# Patient Record
Sex: Male | Born: 1975 | Race: White | Hispanic: No | Marital: Single | Smoking: Current every day smoker
Health system: Southern US, Community
[De-identification: ages and names within clinical notes are randomized; demographics above are authoritative.]

## PROBLEM LIST (undated history)

## (undated) DIAGNOSIS — I5032 Chronic diastolic (congestive) heart failure: Secondary | ICD-10-CM

## (undated) DIAGNOSIS — I10 Essential (primary) hypertension: Secondary | ICD-10-CM

## (undated) DIAGNOSIS — F32A Depression, unspecified: Secondary | ICD-10-CM

## (undated) DIAGNOSIS — G8929 Other chronic pain: Secondary | ICD-10-CM

## (undated) DIAGNOSIS — Z720 Tobacco use: Secondary | ICD-10-CM

## (undated) DIAGNOSIS — F419 Anxiety disorder, unspecified: Secondary | ICD-10-CM

## (undated) DIAGNOSIS — L03119 Cellulitis of unspecified part of limb: Secondary | ICD-10-CM

## (undated) DIAGNOSIS — F259 Schizoaffective disorder, unspecified: Secondary | ICD-10-CM

## (undated) DIAGNOSIS — F329 Major depressive disorder, single episode, unspecified: Secondary | ICD-10-CM

## (undated) DIAGNOSIS — M79606 Pain in leg, unspecified: Secondary | ICD-10-CM

## (undated) HISTORY — DX: Pain in leg, unspecified: M79.606

## (undated) HISTORY — PX: OTHER SURGICAL HISTORY: SHX169

## (undated) HISTORY — DX: Essential (primary) hypertension: I10

## (undated) HISTORY — DX: Schizoaffective disorder, unspecified: F25.9

## (undated) HISTORY — DX: Other chronic pain: G89.29

## (undated) HISTORY — DX: Major depressive disorder, single episode, unspecified: F32.9

## (undated) HISTORY — DX: Depression, unspecified: F32.A

---

## 2002-01-21 HISTORY — PX: OTHER SURGICAL HISTORY: SHX169

## 2004-01-22 HISTORY — PX: OTHER SURGICAL HISTORY: SHX169

## 2006-03-27 HISTORY — PX: OTHER SURGICAL HISTORY: SHX169

## 2006-04-23 ENCOUNTER — Ambulatory Visit: Payer: Self-pay | Admitting: Family Medicine

## 2006-06-02 ENCOUNTER — Encounter: Payer: Self-pay | Admitting: Family Medicine

## 2006-06-02 ENCOUNTER — Ambulatory Visit: Payer: Self-pay | Admitting: Pain Medicine

## 2006-06-02 DIAGNOSIS — F17200 Nicotine dependence, unspecified, uncomplicated: Secondary | ICD-10-CM | POA: Insufficient documentation

## 2006-06-02 DIAGNOSIS — G8929 Other chronic pain: Secondary | ICD-10-CM | POA: Insufficient documentation

## 2006-06-02 DIAGNOSIS — M549 Dorsalgia, unspecified: Secondary | ICD-10-CM

## 2006-06-02 DIAGNOSIS — F411 Generalized anxiety disorder: Secondary | ICD-10-CM | POA: Insufficient documentation

## 2006-06-02 DIAGNOSIS — F329 Major depressive disorder, single episode, unspecified: Secondary | ICD-10-CM

## 2006-06-10 ENCOUNTER — Ambulatory Visit: Payer: Self-pay | Admitting: Pain Medicine

## 2006-06-25 ENCOUNTER — Ambulatory Visit: Payer: Self-pay | Admitting: Pain Medicine

## 2006-06-25 ENCOUNTER — Encounter: Payer: Self-pay | Admitting: Family Medicine

## 2006-07-28 ENCOUNTER — Ambulatory Visit: Payer: Self-pay | Admitting: Pain Medicine

## 2006-07-28 ENCOUNTER — Encounter: Payer: Self-pay | Admitting: Family Medicine

## 2006-08-28 ENCOUNTER — Ambulatory Visit: Payer: Self-pay | Admitting: Pain Medicine

## 2006-08-28 ENCOUNTER — Encounter: Payer: Self-pay | Admitting: Family Medicine

## 2006-09-17 ENCOUNTER — Ambulatory Visit: Payer: Self-pay | Admitting: Pain Medicine

## 2006-10-20 ENCOUNTER — Ambulatory Visit: Payer: Self-pay | Admitting: Pain Medicine

## 2006-11-25 ENCOUNTER — Ambulatory Visit: Payer: Self-pay | Admitting: Physician Assistant

## 2006-12-24 ENCOUNTER — Ambulatory Visit: Payer: Self-pay | Admitting: Physician Assistant

## 2006-12-31 ENCOUNTER — Telehealth: Payer: Self-pay | Admitting: Family Medicine

## 2007-01-06 ENCOUNTER — Encounter (INDEPENDENT_AMBULATORY_CARE_PROVIDER_SITE_OTHER): Payer: Self-pay | Admitting: *Deleted

## 2007-02-23 ENCOUNTER — Ambulatory Visit: Payer: Self-pay | Admitting: Physician Assistant

## 2007-05-26 ENCOUNTER — Ambulatory Visit: Payer: Self-pay | Admitting: Physician Assistant

## 2007-07-31 ENCOUNTER — Ambulatory Visit: Payer: Self-pay | Admitting: Family Medicine

## 2007-08-14 ENCOUNTER — Ambulatory Visit: Payer: Self-pay | Admitting: Family Medicine

## 2007-08-18 ENCOUNTER — Ambulatory Visit: Payer: Self-pay | Admitting: Professional

## 2007-08-18 ENCOUNTER — Telehealth: Payer: Self-pay | Admitting: Family Medicine

## 2007-08-26 ENCOUNTER — Ambulatory Visit: Payer: Self-pay | Admitting: Physician Assistant

## 2007-08-26 ENCOUNTER — Ambulatory Visit: Payer: Self-pay | Admitting: Family Medicine

## 2007-09-29 ENCOUNTER — Ambulatory Visit: Payer: Self-pay | Admitting: Family Medicine

## 2007-11-19 ENCOUNTER — Ambulatory Visit: Payer: Self-pay | Admitting: Physician Assistant

## 2008-02-18 ENCOUNTER — Ambulatory Visit: Payer: Self-pay | Admitting: Physician Assistant

## 2008-05-17 ENCOUNTER — Ambulatory Visit: Payer: Self-pay | Admitting: Physician Assistant

## 2008-08-25 ENCOUNTER — Ambulatory Visit: Payer: Self-pay | Admitting: Physician Assistant

## 2008-11-24 ENCOUNTER — Ambulatory Visit: Payer: Self-pay | Admitting: Physician Assistant

## 2009-01-30 ENCOUNTER — Ambulatory Visit: Payer: Self-pay | Admitting: Family Medicine

## 2009-03-22 ENCOUNTER — Ambulatory Visit: Payer: Self-pay | Admitting: Family Medicine

## 2009-03-27 ENCOUNTER — Encounter: Payer: Self-pay | Admitting: Family Medicine

## 2009-03-27 ENCOUNTER — Ambulatory Visit: Payer: Self-pay | Admitting: Pain Medicine

## 2009-06-14 ENCOUNTER — Ambulatory Visit: Payer: Self-pay | Admitting: Family Medicine

## 2009-06-14 DIAGNOSIS — S6980XA Other specified injuries of unspecified wrist, hand and finger(s), initial encounter: Secondary | ICD-10-CM

## 2009-06-14 DIAGNOSIS — S6990XA Unspecified injury of unspecified wrist, hand and finger(s), initial encounter: Secondary | ICD-10-CM | POA: Insufficient documentation

## 2009-07-10 ENCOUNTER — Telehealth: Payer: Self-pay | Admitting: Family Medicine

## 2009-07-28 ENCOUNTER — Telehealth: Payer: Self-pay | Admitting: Family Medicine

## 2009-08-08 ENCOUNTER — Telehealth: Payer: Self-pay | Admitting: Family Medicine

## 2009-08-15 ENCOUNTER — Ambulatory Visit: Payer: Self-pay | Admitting: Family Medicine

## 2009-08-24 ENCOUNTER — Ambulatory Visit: Payer: Self-pay | Admitting: Family Medicine

## 2009-09-01 ENCOUNTER — Telehealth: Payer: Self-pay | Admitting: Family Medicine

## 2009-09-28 ENCOUNTER — Telehealth: Payer: Self-pay | Admitting: Family Medicine

## 2009-10-10 ENCOUNTER — Ambulatory Visit: Payer: Self-pay | Admitting: Family Medicine

## 2009-10-11 LAB — CONVERTED CEMR LAB
AST: 14 units/L (ref 0–37)
Alkaline Phosphatase: 82 units/L (ref 39–117)
Creatinine, Ser: 1.05 mg/dL (ref 0.40–1.50)
Potassium: 4.4 meq/L (ref 3.5–5.3)
Total Bilirubin: 0.5 mg/dL (ref 0.3–1.2)
Total Protein: 7 g/dL (ref 6.0–8.3)

## 2009-10-13 ENCOUNTER — Encounter: Payer: Self-pay | Admitting: Family Medicine

## 2009-10-13 ENCOUNTER — Ambulatory Visit: Payer: Self-pay | Admitting: Family Medicine

## 2009-10-13 DIAGNOSIS — E7800 Pure hypercholesterolemia, unspecified: Secondary | ICD-10-CM | POA: Insufficient documentation

## 2009-10-13 DIAGNOSIS — R7309 Other abnormal glucose: Secondary | ICD-10-CM | POA: Insufficient documentation

## 2009-10-16 ENCOUNTER — Telehealth: Payer: Self-pay | Admitting: Family Medicine

## 2009-10-25 ENCOUNTER — Telehealth: Payer: Self-pay | Admitting: Family Medicine

## 2009-11-07 ENCOUNTER — Telehealth: Payer: Self-pay | Admitting: Family Medicine

## 2009-11-14 ENCOUNTER — Ambulatory Visit: Payer: Self-pay | Admitting: Family Medicine

## 2009-11-17 ENCOUNTER — Telehealth: Payer: Self-pay | Admitting: Family Medicine

## 2009-11-28 ENCOUNTER — Telehealth: Payer: Self-pay | Admitting: Family Medicine

## 2009-12-04 ENCOUNTER — Telehealth: Payer: Self-pay | Admitting: Family Medicine

## 2009-12-07 ENCOUNTER — Telehealth: Payer: Self-pay | Admitting: Family Medicine

## 2009-12-19 ENCOUNTER — Telehealth: Payer: Self-pay | Admitting: Family Medicine

## 2009-12-28 ENCOUNTER — Telehealth: Payer: Self-pay | Admitting: Family Medicine

## 2010-01-01 ENCOUNTER — Telehealth: Payer: Self-pay | Admitting: Family Medicine

## 2010-01-02 ENCOUNTER — Ambulatory Visit: Payer: Self-pay | Admitting: Family Medicine

## 2010-01-02 DIAGNOSIS — M79609 Pain in unspecified limb: Secondary | ICD-10-CM | POA: Insufficient documentation

## 2010-01-04 ENCOUNTER — Encounter: Payer: Self-pay | Admitting: Family Medicine

## 2010-01-25 ENCOUNTER — Telehealth (INDEPENDENT_AMBULATORY_CARE_PROVIDER_SITE_OTHER): Payer: Self-pay | Admitting: *Deleted

## 2010-01-31 ENCOUNTER — Telehealth: Payer: Self-pay | Admitting: Family Medicine

## 2010-02-19 ENCOUNTER — Telehealth: Payer: Self-pay | Admitting: Family Medicine

## 2010-02-20 NOTE — Progress Notes (Signed)
Summary: hydrocodone  Phone Note Refill Request Call back at Home Phone [phone] Message from:  Patient on September 01, 2009 8:42 AM  Refills Requested: Medication #1:  HYDROCODONE-ACETAMINOPHEN 10-650 MG TABS Take 1 tablet by mouth every 4 hours as needed for pain Patient will need this on monday.    Method Requested: Pick up at Office Initial call taken by: Melody Comas,  September 01, 2009 8:42 AM  Follow-up for Phone Call        Patient notified, Rx's ready for pick up will be left at front desk. Follow-up by: [name] CMA Duncan Dull),  September 01, 2009 10:03 AM    Prescriptions: HYDROCODONE-ACETAMINOPHEN 10-650 MG TABS (HYDROCODONE-ACETAMINOPHEN) Take 1 tablet by mouth every 4 hours as needed for pain  #20 x 0   Entered and Authorized by:   Kerby Nora MD   Signed by:   Kerby Nora MD on 09/01/2009   Method used:   Print then Give to Patient   RxID:   [id number] HYDROCODONE-ACETAMINOPHEN 10-650 MG TABS (HYDROCODONE-ACETAMINOPHEN) Take 1 tablet by mouth every 4 hours as needed for pain  #100 x 0   Entered and Authorized by:   Kerby Nora MD   Signed by:   Kerby Nora MD on 09/01/2009   Method used:   Print then Give to Patient   RxID:   [id number]

## 2010-02-20 NOTE — Progress Notes (Signed)
Summary: refill request for vicodin  Phone Note Refill Request Call back at Home Phone [phone] Message from:  Patient  Refills Requested: Medication #1:  HYDROCODONE-ACETAMINOPHEN 10-650 MG TABS Take 1 tablet by mouth every 4 hours as needed for pain Phoned request from pt, please call to [facility] and let pt know when done.  Initial call taken by: Lowella Petties CMA,  September 28, 2009 8:46 AM  Follow-up for Phone Call        Controlled. can wait until tomorrow.  Follow-up by: Hannah Beat MD,  September 28, 2009 9:00 AM  Additional Follow-up for Phone Call Additional follow up Details #1::        This pt has 2 prescriptions to call in for his insurance...on e for #100 and 1 for #20 Additional Follow-up by: Kerby Nora MD,  September 29, 2009 10:11 AM    Additional Follow-up for Phone Call Additional follow up Details #2::    Rx's called to pharmacy and patient was notified. Follow-up by: [name] CMA Duncan Dull),  September 29, 2009 10:28 AM  Prescriptions: HYDROCODONE-ACETAMINOPHEN 10-650 MG TABS (HYDROCODONE-ACETAMINOPHEN) Take 1 tablet by mouth every 4 hours as needed for pain  #20 x 0   Entered and Authorized by:   Kerby Nora MD   Signed by:   Kerby Nora MD on 09/29/2009   Method used:   Telephoned to ...       [facility] [id number]       [address]       [place], [place]  [address]       Ph: [phone] or [phone]       Fax: [phone]   RxID:   [id number] HYDROCODONE-ACETAMINOPHEN 10-650 MG TABS (HYDROCODONE-ACETAMINOPHEN) Take 1 tablet by mouth every 4 hours as needed for pain  #100 x 0   Entered and Authorized by:   Kerby Nora MD   Signed by:   Kerby Nora MD on 09/29/2009   Method used:   Telephoned to ...       [facility] (retail)       [address]       [place], [place]  [address]       Ph: [phone] or [phone]       Fax: [phone]   RxID:   [id number]

## 2010-02-20 NOTE — Progress Notes (Signed)
Summary: refill request for vicodin  Phone Note Refill Request Call back at [phone] Message from:  Patient  Refills Requested: Medication #1:  HYDROCODONE-ACETAMINOPHEN 10-650 MG TABS Take 1 tablet by mouth every 4 hours as needed for pain [facility] to [facility].  This is a little early but pt is going out of town tomorrow.  Please let pt know.  Initial call taken by: Lowella Petties CMA,  October 25, 2009 10:57 AM  Follow-up for Phone Call        Notes reviewed  I think OK in this case  call in 2 scripts for this  #100 and #20  Fix in med list - per Dr. Albertina Senegal order on last note. Spencer Copland MD  October 25, 2009 11:04 AM   Additional Follow-up for Phone Call Additional follow up Details #1::        Can only call in 1 script so i call in the 100 b/c insurance will not pay for both if called in at the same time and patient advised also.Consuello Masse CMA   Additional Follow-up by: Benny Lennert CMA Duncan Dull),  October 25, 2009 11:07 AM

## 2010-02-20 NOTE — Progress Notes (Signed)
Summary: hydrocodone  Phone Note Refill Request Call back at Home Phone [phone] Message from:  Patient on August 08, 2009 8:53 AM  Refills Requested: Medication #1:  HYDROCODONE-ACETAMINOPHEN 10-650 MG TABS Take 1 tablet by mouth every 4 hours as needed for pain Patient wants written script   Method Requested: Pick up at Office Initial call taken by: Melody Comas,  August 08, 2009 8:53 AM  Follow-up for Phone Call        patient wanted written scripts  they are printed and ready to be signed.Consuello Masse CMA   Follow-up by: Benny Lennert CMA Duncan Dull),  August 08, 2009 10:26 AM    Prescriptions: HYDROCODONE-ACETAMINOPHEN 10-650 MG TABS (HYDROCODONE-ACETAMINOPHEN) Take 1 tablet by mouth every 4 hours as needed for pain  #100 x 0   Entered by:   Benny Lennert CMA (AAMA)   Authorized by:   Kerby Nora MD   Signed by:   Benny Lennert CMA (AAMA) on 08/08/2009   Method used:   Print then Give to Patient   RxID:   [id number] HYDROCODONE-ACETAMINOPHEN 10-650 MG TABS (HYDROCODONE-ACETAMINOPHEN) Take 1 tablet by mouth every 4 hours as needed for pain  #20 x 0   Entered by:   Benny Lennert CMA (AAMA)   Authorized by:   Kerby Nora MD   Signed by:   Benny Lennert CMA (AAMA) on 08/08/2009   Method used:   Print then Give to Patient   RxID:   [id number] HYDROCODONE-ACETAMINOPHEN 10-650 MG TABS (HYDROCODONE-ACETAMINOPHEN) Take 1 tablet by mouth every 4 hours as needed for pain  #20 x 0   Entered and Authorized by:   Kerby Nora MD   Signed by:   Kerby Nora MD on 08/08/2009   Method used:   Telephoned to ...       [facility] [id number]       [address]       [place], [place]  [address]       Ph: [phone] or [phone]       Fax: [phone]   RxID:   [id number] HYDROCODONE-ACETAMINOPHEN 10-650 MG TABS (HYDROCODONE-ACETAMINOPHEN) Take 1 tablet by mouth every 4 hours as needed for pain  #100 x 0   Entered and Authorized by:   Kerby Nora MD   Signed by:   Kerby Nora MD on 08/08/2009   Method used:   Telephoned to ...       [facility] (retail)       [address]       [place], [place]  [address]       Ph: [phone] or [phone]       Fax: [phone]   RxID:   [id number]

## 2010-02-20 NOTE — Progress Notes (Signed)
Summary: chantix not covered by insurance  Phone Note Call from Patient Call back at Home Phone [phone]   Caller: Patient Call For: Kerby Nora MD Summary of Call: Pt says he was prescribed chantix, but that is not covered on his insurance.  He is asking if he can try something else, uses [facility]. Initial call taken by: Lowella Petties CMA,  October 16, 2009 2:40 PM  Follow-up for Phone Call        Sent in wellbutrin two times a day ...set quit day and start medicaiton 1-2 weeks prior Follow-up by: Kerby Nora MD,  October 17, 2009 8:26 AM  Additional Follow-up for Phone Call Additional follow up Details #1::        Patient advised.Consuello Masse CMA   Additional Follow-up by: Benny Lennert CMA Duncan Dull),  October 17, 2009 9:15 AM    New/Updated Medications: BUPROPION HCL 150 MG XR12H-TAB (BUPROPION HCL) 1 tab by mouth two times a day Prescriptions: BUPROPION HCL 150 MG XR12H-TAB (BUPROPION HCL) 1 tab by mouth two times a day  #60 x 2   Entered and Authorized by:   Kerby Nora MD   Signed by:   Kerby Nora MD on 10/17/2009   Method used:   Electronically to        [facility] (retail)       [address]       [place], [place]  [address]       Ph: [phone] or [phone]       Fax: [phone]   RxID:   [id number]

## 2010-02-20 NOTE — Progress Notes (Signed)
Summary: refill request for percocet  Phone Note Refill Request Call back at Home Phone [phone] Message from:  Patient  Refills Requested: Medication #1:  PERCOCET 5-325 MG TABS 1tab by mouth daily as needed breakthrough pain..conitnue to use vicodin for regular pain management. Please call pt when ready.  Initial call taken by: Lowella Petties CMA, AAMA,  December 28, 2009 8:09 AM  Follow-up for Phone Call        Patient advised rx ready for pick up.Consuello Masse CMA   Follow-up by: Benny Lennert CMA Duncan Dull),  December 29, 2009 9:05 AM    Prescriptions: PERCOCET 5-325 MG TABS (OXYCODONE-ACETAMINOPHEN) 1tab by mouth daily as needed breakthrough pain..conitnue to use vicodin for regular pain management  #30 x 0   Entered and Authorized by:   Kerby Nora MD   Signed by:   Kerby Nora MD on 12/29/2009   Method used:   Print then Give to Patient   RxID:   [id number]

## 2010-02-20 NOTE — Assessment & Plan Note (Signed)
Summary: CPX/RBH   Vital Signs:  Patient profile:   35 year old male Height:      76 inches Weight:      203.0 pounds BMI:     24.80 Temp:     97.9 degrees F oral Pulse rate:   80 / minute Pulse rhythm:   regular BP sitting:   120 / 80  (left arm) Cuff size:   regular  Vitals Entered By: Benny Lennert CMA Duncan Dull) (October 13, 2009 2:55 PM)  History of Present Illness: Chief complaint cpx  Preventive Screening-Counseling & Management  Alcohol-Tobacco     Alcohol drinks/day: 0     Smoking Status: current     Smoking Cessation Counseling: YES     Smoke Cessation Stage: ready     Packs/Day: 1.0     Year Started: 16     Tobacco Counseling: to quit use of tobacco products  Caffeine-Diet-Exercise     Caffeine use/day: mountain dews 4-5 20oz a day  Problems Prior to Update: 1)  Chest Pain, Atypical  (ICD-786.59) 2)  Prediabetes  (ICD-790.29) 3)  Screening For Lipoid Disorders  (ICD-V77.91) 4)  Injury, Finger  (ICD-959.5) 5)  Uri  (ICD-465.9) 6)  Tobacco Abuse  (ICD-305.1) 7)  Anxiety  (ICD-300.00) 8)  Depression  (ICD-311) 9)  Pain, Chronic Nec  (ICD-338.29)  Current Medications (verified): 1)  Hydrocodone-Acetaminophen 10-650 Mg Tabs (Hydrocodone-Acetaminophen) .... Take 1 Tablet By Mouth Every 4 Hours As Needed For Pain 2)  Diazepam 10 Mg Tabs (Diazepam) .... Take 1 Tablet By Mouth Four Times A Day  Allergies (verified): No Known Drug Allergies  Past History:  Past medical, surgical, family and social histories (including risk factors) reviewed, and no changes noted (except as noted below).  Past Surgical History: Reviewed history from 06/02/2006 and no changes required. 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 Father: Alive 9 carotid  stenosis Mother: Alive 35, high chol, HTN Siblings: 4 brothers healthy  Social History: Reviewed history from 06/02/2006 and no changes required. Marital Status: Single - in relationship, + sex, + condom Children: None Occupation: Insurance claims handler, IT sales professional Exercise:  None, running causes head pain Diet:  FF once daily, + fruits and veggies, + H20, + Soda Current Smoker 1-2 PPD x 10 years Alcohol use-yes occasional Drug use-no Packs/Day:  1.0 Caffeine use/day:  mountain dews 4-5 20oz a day  Review of Systems General:  Denies fatigue and fever. CV:  Complains of chest pain or discomfort and palpitations; Since age 36-11..has had sharp pain in left rib cage...more frequent in last month...occuring 3-4 times a week. Feels like knife upwards. LAsts 5-10 min or just for seconds.   Spontaneous..no associaton with food, exertion. Occ pain in neck at same time. Father with history of carotid stenosis. Occ vibration in chest.. Resp:  Denies cough, coughing up blood, shortness of breath, sputum productive, and wheezing. GI:  Denies abdominal pain, bloody stools, change in bowel habits, and constipation; BMs 1-2 times daily...soft to firm. .  Physical Exam  General:  Well-developed,well-nourished,in no acute distress; alert,appropriate and cooperative throughout examination VSS, non toxic appearing. Eyes:  No corneal or conjunctival inflammation noted. EOMI. Perrla. Funduscopic exam benign, without hemorrhages, exudates or papilledema. Vision grossly normal. Ears:  R ear normal and L ear normal.   Nose:  mucosal erythema.   Mouth:  pharyngeal erythema.   Neck:  no carotid bruit or thyromegaly no cervical or supraclavicular lymphadenopathy  Lungs:  Normal respiratory effort, chest expands symmetrically.  scattered exp wheezes, no crackles. Heart:  Normal rate and regular rhythm. S1 and S2 normal without gallop, murmur, click, rub or other extra sounds. Msk:  No deformity or  scoliosis noted of thoracic or lumbar spine.   Deformity of jaw.  Pulses:  R and L posterior tibial pulses are full and equal bilaterally  Extremities:  no edema  Neurologic:  No cranial nerve deficits noted. Station and gait are normal. Plantar reflexes are down-going bilaterally. DTRs are symmetrical throughout. Sensory, motor and coordinative functions appear intact. Skin:  Intact without suspicious lesions or rashes Psych:  Cognition and judgment appear intact. Alert and cooperative with normal attention span and concentration. No apparent delusions, illusions, hallucinations   Impression & Recommendations:  Problem # 1:  Preventive Health Care (ICD-V70.0) The patient's preventative maintenance and recommended screening tests for an annual wellness exam were reviewed in full today. Brought up to date unless services declined.  Counselled on the importance of diet, exercise, and its role in overall health and mortality. The patient's FH and SH was reviewed, including their home life, tobacco status, and drug and alcohol status.     Problem # 2:  PREDIABETES (ICD-790.29) Encouraged exercise, weight loss, healthy eating habits.  Decrease sugar in diet.   Problem # 3:  HYPERCHOLESTEROLEMIA (ICD-272.0) Counseled on low chol diet. Info given.  Labs Reviewed:  SGOT: 14 (10/10/2009)   SGPT: 13 (10/10/2009)   HDL:29 (10/10/2009)  LDL:139 (10/10/2009)  Chol:203 (10/10/2009)  Trig:176 (10/10/2009)  Problem # 4:  TOBACCO ABUSE (ICD-305.1)  His updated medication list for this problem includes:    Chantix Starting Month Pak 0.5 Mg X 11 & 1 Mg X 42 Tabs (Varenicline tartrate) .Marland Kitchen... As directed    Chantix Continuing Month Pak 1 Mg Tabs (Varenicline tartrate) .Marland Kitchen... 1 tab by mouth two times a day x 3 months  Orders: Prescription Created Electronically [phone])  Complete Medication List: 1)  Hydrocodone-acetaminophen 10-650 Mg Tabs (Hydrocodone-acetaminophen) .... Take 1 tablet by mouth every  4 hours as needed for pain 2)  Diazepam 10 Mg Tabs (Diazepam) .... Take 1 tablet by mouth four times a day 3)  Chantix Starting Month Pak 0.5 Mg X 11 & 1 Mg X 42 Tabs (Varenicline tartrate) .... As directed 4)  Chantix Continuing Month Pak 1 Mg Tabs (Varenicline tartrate) .Marland Kitchen.. 1 tab by mouth two times a day x 3 months  Other Orders: EKG w/ Interpretation (93[address])  Patient Instructions: 1)  Decrease caffeine intake. Decrease or stop [facility]. 2)  Tobacco is very bad for your health and your loved ones ! You should stop smoking !  3)  Stop smoking tips: Choose a quit date. Cut down before the quit date. Decide what you will do as a substitute when you feel the urge to smoke(gum, toothpick, exercise).  4)  Start chantix as directed. 5)  Call if any problems. 6)  Decrease red meats, cheeses, pizza...increase fruits and veggies and lean meats like chicken , [place], fish. 7)  Increase water...increase fiber in diet. Prescriptions: CHANTIX CONTINUING MONTH PAK 1 MG TABS (VARENICLINE TARTRATE) 1 tab by mouth two times a day x 3 months  #1 pack x 2   Entered and Authorized by:   Kerby Nora MD  Signed by:   Kerby Nora MD on 10/13/2009   Method used:   [facility]        [facility] [id number]       [address]       [place], [place]  [address]       Ph: [phone] or [phone]       Fax: [phone]   RxID:   [id number] CHANTIX STARTING MONTH PAK 0.5 MG X 11 & 1 MG X 42 TABS (VARENICLINE TARTRATE) As directed  #1 pack x 0   Entered and Authorized by:   Kerby Nora MD   Signed by:   Kerby Nora MD on 10/13/2009   Method used:   [facility]ronically to        [facility] (retail)       [address]       [place], [place]  [address]       Ph: [phone] or [phone]       Fax: [phone]   RxID:   [id number]   Current Allergies (reviewed today): No known allergies   Flu Vaccine Next Due:  Refused

## 2010-02-20 NOTE — Progress Notes (Signed)
Summary: refill request for vicodin  Phone Note Refill Request Call back at Home Phone [phone] Message from:  Patient  Refills Requested: Medication #1:  HYDROCODONE-ACETAMINOPHEN 10-650 MG TABS Take 1 tablet by mouth every 4 hours as needed for pain Pt will run out of medicine on saturday but he is going out of town tomorrow.  [facility] to [facility].  Initial call taken by: Lowella Petties CMA, AAMA,  December 07, 2009 9:52 AM  Follow-up for Phone Call        Rx called to pharmacy Follow-up by: Benny Lennert CMA Duncan Dull),  December 07, 2009 10:18 AM    Prescriptions: HYDROCODONE-ACETAMINOPHEN 10-650 MG TABS (HYDROCODONE-ACETAMINOPHEN) Take 1 tablet by mouth every 4 hours as needed for pain  #30 x 0   Entered and Authorized by:   Ruthe Mannan MD   Signed by:   Ruthe Mannan MD on 12/07/2009   Method used:   Telephoned to ...       [facility] ([facility]ail)       [address]       [place], [place]  [address]       Ph: [phone] or [phone]       Fax: [phone]   RxID:   [id number]

## 2010-02-20 NOTE — Progress Notes (Signed)
Summary: hip is not any better  Phone Note Call from Patient Call back at [phone]   Caller: Patient Call For: Kerby Nora MD Summary of Call: Pt was given prednisone for hip pain but this didnt help.  His hip is not any better.  What is next step.  [facility]. Initial call taken by: Lowella Petties CMA, AAMA,  November 28, 2009 12:59 PM  Follow-up for Phone Call        Temporary prescription for percocet for breakthrough pain. Continue to use vicoding for daily pain managemnt.. use percocet for breakthrough. Follow-up by: Kerby Nora MD,  November 28, 2009 1:19 PM  Additional Follow-up for Phone Call Additional follow up Details #1::        Patient advised and rx up front for pick up Additional Follow-up by: Benny Lennert CMA Duncan Dull),  November 28, 2009 2:02 PM    New/Updated Medications: PERCOCET 5-325 MG TABS (OXYCODONE-ACETAMINOPHEN) 1tab by mouth daily as needed breakthrough pain..conitnue to use vicodin for regular pain management Prescriptions: PERCOCET 5-325 MG TABS (OXYCODONE-ACETAMINOPHEN) 1tab by mouth daily as needed breakthrough pain..conitnue to use vicodin for regular pain management  #30 x 0   Entered and Authorized by:   Kerby Nora MD   Signed by:   Kerby Nora MD on 11/28/2009   Method used:   Print then Give to Patient   RxID:   [id number]

## 2010-02-20 NOTE — Assessment & Plan Note (Signed)
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 Since that time, he has had several bone grafts to his jaw. He has had problems with chronic pain. He had been controlled on  hydrocodone.   He had also been changed to valium from xanax for his anxiety .Marland Kitchenby Dr. Evelene Croon pshychiatry. Sees her every 6 months..last appointment 05/12/2009  [facility] in detail with [facility]... Dr. Laban Emperor. He states patient has kept appointments, has used only one drug store and has not failed any drug tests. He believes that letter discharging the patient was written incorrectly. He should not have been discharged for any reason other that he was not able to pay bill. I relayed this info to the patient. He has agreed to be seen by Dr. Laban Emperor one last time, pain med prescritpion will be written for 1 month and documentation will be corrected by Dr. Laban Emperor....saw Dr. Dorris Carnes on 03/27/2009...given RX for 3 months prescription. Pt will continue to see Dr. Evelene Croon. He will come in monthly in next few months for pain medication prescriptions..to assure his stability, then we will space this to every 3 months. He will sign pain contract when we have these avaialble.   Using 120 tabs a month..bit insurance will only cover 100 tabs at a time...s needs additional rx for 20 each month. Does state that higher dose of tylenol..650 mg last longer for him.Marland Kitchenocc he can space it to 3 tabs a day    Problems Prior to Update: 1)  Uri  (ICD-465.9) 2)  Tobacco Abuse  (ICD-305.1) 3)  Anxiety   (ICD-300.00) 4)  Depression  (ICD-311) 5)  Pain, Chronic Nec  (ICD-338.29)  Current Medications (verified): 1)  Hydrocodone-Acetaminophen 10-500 Mg Tabs (Hydrocodone-Acetaminophen) .... Take 1 Tablet By Mouth Every 6 Hours As Needed As Needed Pain 2)  Diazepam 10 Mg Tabs (Diazepam) .... Take 1 Tablet By Mouth Four Times A Day  Allergies (verified): No Known Drug Allergies  Past History:  Past medical, surgical, family and social histories (including risk factors) reviewed, and no changes noted (except as noted below).  Past Surgical History: Reviewed history from 06/02/2006 and no changes required. 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 Father: Alive 47 healthy Mother: Alive 27, high chol, HTN Siblings: 4 brothers healthy  Social History: Reviewed history from 06/02/2006 and no changes required. Marital Status: Single - in relationship, + sex, + condom Children: None Occupation: Insurance claims handler, IT sales professional Exercise:  None, running causes head pain Diet:  FF once daily, + fruits and veggies, + H20, + Soda Current Smoker 1-2 PPD x 10 years Alcohol use-yes occasional Drug use-no  Review of Systems General:  Denies fatigue and fever. CV:  Denies chest pain or discomfort. Resp:  Denies shortness of breath. GI:  Denies abdominal pain. GU:  Denies dysuria.  Physical Exam  General:  Well-developed,well-nourished,in no acute distress; alert,appropriate and cooperative throughout examination Mouth:  MMM Neck:  no carotid bruit or thyromegaly no cervical or supraclavicular lymphadenopathy  Lungs:  Normal respiratory effort, chest expands symmetrically. Lungs are clear to auscultation, no crackles or wheezes. Heart:  Normal rate and regular rhythm. S1  and S2 normal without gallop, murmur, click, rub or other extra sounds. Msk:  No deformity or scoliosis noted of thoracic or lumbar spine.   Deformity of jaw.  Pulses:  R and L posterior tibial pulses are full and equal bilaterally  Extremities:  no edema Skin:  healing abraison right 3rd digit.   Impression & Recommendations:  Problem # 1:  PAIN, CHRONIC NEC (ICD-338.29) Stable on current medication. Will sign pain contract when available to [place]. Refill..2 rxs 100 and 20 given insurance parameters. Increase tylenol in tab...not near max dose of acetominophin if using 3-4 per day.  Problem # 2:  ANXIETY (ICD-300.00) Well controlled per Dr. Evelene Croon. No recetn changes in medication.  His updated medication list for this problem includes:    Diazepam 10 Mg Tabs (Diazepam) .Marland Kitchen... Take 1 tablet by mouth four times a day  Problem # 3:  INJURY, FINGER (ICD-959.5) Wash with warm soapy water.Marland Kitchengiven tetanus and cipro preventative  at [facility].  Healing well.   Complete Medication List: 1)  Hydrocodone-acetaminophen 10-650 Mg Tabs (Hydrocodone-acetaminophen) .... Take 1 tablet by mouth every 4 hours as needed for pain 2)  Diazepam 10 Mg Tabs (Diazepam) .... Take 1 tablet by mouth four times a day  Patient Instructions: 1)  Schedule CPX in one  month. 2)  Prior to appt...CMET, lipids Dx v77.91 Prescriptions: HYDROCODONE-ACETAMINOPHEN 10-650 MG TABS (HYDROCODONE-ACETAMINOPHEN) Take 1 tablet by mouth every 4 hours as needed for pain  #20 x 0   Entered and Authorized by:   Kerby Nora MD   Signed by:   Kerby Nora MD on 06/14/2009   Method used:   Print then Give to Patient   RxID:   [id number] HYDROCODONE-ACETAMINOPHEN 10-650 MG TABS (HYDROCODONE-ACETAMINOPHEN) Take 1 tablet by mouth every 4 hours as needed for pain  #100 x 0   Entered and Authorized by:   Kerby Nora MD   Signed by:   Kerby Nora MD on 06/14/2009   Method used:   Print then Give to Patient   RxID:    [id number]   Current Allergies (reviewed today): No known allergies   TD Result Date:  06/08/2009 TD Result:  given TD Next Due:  10 yr

## 2010-02-20 NOTE — Letter (Signed)
Summary: Out of Work  [employer] at [facility]  [address] [place], [place] [address]   Phone: [phone]  Fax: [phone]    August 15, 2009   Employee:  EVONTE PRESTAGE    To Whom It May Concern:   For Medical reasons, please excuse the above named employee from work for the following dates:  Start:   08/15/2009  End:   08/16/2009  If you need additional information, please feel free to contact our office.         Sincerely,    Ruthe Mannan MD

## 2010-02-20 NOTE — Assessment & Plan Note (Signed)
Summary: F/U,NOT FEELING BETTER/CLE   Vital Signs:  Patient profile:   35 year old male Height:      76 inches Weight:      195.38 pounds BMI:     23.87 Temp:     97.9 degrees F oral Pulse rate:   80 / minute Pulse rhythm:   regular BP sitting:   108 / 70  (left arm) Cuff size:   regular  Vitals Entered By: [name] CMA Duncan Dull) (August 24, 2009 8:05 AM) CC: not feeling any better   History of Present Illness: 35 yo here for no improvement of symptoms from URI. Saw him on 7/26/2011with 3 days of productive cough, runny nose, malaise. Started feeling feverish with chills yesterday. Taking Mucinex OTC and tessalon perles with no relief of symptoms.      Current Medications (verified): 1)  Hydrocodone-Acetaminophen 10-650 Mg Tabs (Hydrocodone-Acetaminophen) .... Take 1 Tablet By Mouth Every 4 Hours As Needed For Pain 2)  Diazepam 10 Mg Tabs (Diazepam) .... Take 1 Tablet By Mouth Four Times A Day 3)  Augmentin 875-125 Mg Tabs (Amoxicillin-Pot Clavulanate) .Marland Kitchen.. 1 By Mouth 2 Times Daily X 10 Days  Allergies (verified): No Known Drug Allergies  Past History:  Past Surgical History: Last updated: 06/02/2006 01/2002 Melioblastoma, lower jaw removed 2004 Bone graft from hip to jaw 2006 Bone graft right side of head to jaw          Sphenopalatine ganglionic 03/27/06 block procedure at pain center  Family History: Last updated: 06/02/2006 Father: Alive 51 healthy Mother: Alive 80, high chol, HTN Siblings: 4 brothers healthy  Social History: Last updated: 06/02/2006 Marital Status: Single - in relationship, + sex, + condom Children: None Occupation: Insurance claims handler, IT sales professional Exercise:  None, running causes head pain Diet:  FF once daily, + fruits and veggies, + H20, + Soda Current Smoker 1-2 PPD x 10 years Alcohol use-yes occasional Drug use-no  Risk Factors: Smoking Status: current (06/02/2006)  Review of Systems      See HPI General:  Complains of chills  and fever. Resp:  Complains of cough and sputum productive; denies shortness of breath and wheezing.  Physical Exam  General:  Well-developed,well-nourished,in no acute distress; alert,appropriate and cooperative throughout examination VSS, non toxic appearing. Nose:  mucosal erythema.   Mouth:  pharyngeal erythema.   Lungs:  Normal respiratory effort, chest expands symmetrically.  scattered exp wheezes, no crackles. Heart:  Normal rate and regular rhythm. S1 and S2 normal without gallop, murmur, click, rub or other extra sounds. Extremities:  no edema Psych:  Cognition and judgment appear intact. Alert and cooperative with normal attention span and concentration. No apparent delusions, illusions, hallucinations   Impression & Recommendations:  Problem # 1:  URI (ICD-465.9) Assessment Deteriorated Given duration and progression of symptoms will treat for bronchitis with Augmentin x 10 days. The following medications were removed from the medication list:    Tessalon Perles 100 Mg Caps (Benzonatate) .Marland Kitchen... 1 tab by mouth three times a day as needed cough.  Complete Medication List: 1)  Hydrocodone-acetaminophen 10-650 Mg Tabs (Hydrocodone-acetaminophen) .... Take 1 tablet by mouth every 4 hours as needed for pain 2)  Diazepam 10 Mg Tabs (Diazepam) .... Take 1 tablet by mouth four times a day 3)  Augmentin 875-125 Mg Tabs (Amoxicillin-pot clavulanate) .Marland Kitchen.. 1 by mouth 2 times daily x 10 days Prescriptions: AUGMENTIN 875-125 MG TABS (AMOXICILLIN-POT CLAVULANATE) 1 by mouth 2 times daily x 10 days  #20  x 0   Entered and Authorized by:   Ruthe Mannan MD   Signed by:   Ruthe Mannan MD on 08/24/2009   Method used:   Elect[facility]ically [facility]        [facility] (retail)       [address]       [place], [place]  [address]       Ph: [phone] or [phone]       Fax: [phone]   RxID:   [id number]   Current Allergies (reviewed today): No known allergies

## 2010-02-20 NOTE — Progress Notes (Signed)
Summary: refill request for vicodin  Phone Note Refill Request Call back at Home Phone [phone] Message from:  Patient  Refills Requested: Medication #1:  HYDROCODONE-ACETAMINOPHEN 10-650 MG TABS Take 1 tablet by mouth every 4 hours as needed for pain Pt is asking for a written script.  He will pick up when ready.  Initial call taken by: Lowella Petties CMA,  July 10, 2009 8:57 AM  Follow-up for Phone Call        Dr. Willy Eddy notes reviewed -- has been getting #100 vicodin monthly Follow-up by: Hannah Beat MD,  July 10, 2009 5:32 PM    Prescriptions: HYDROCODONE-ACETAMINOPHEN 10-650 MG TABS (HYDROCODONE-ACETAMINOPHEN) Take 1 tablet by mouth every 4 hours as needed for pain  #100 x 0   Entered and Authorized by:   Hannah Beat MD   Signed by:   Hannah Beat MD on 07/10/2009   Method used:   Print then Give to Patient   RxID:   [id number]

## 2010-02-20 NOTE — Progress Notes (Signed)
Summary: Rx Hydrocodone  Phone Note Refill Request Call back at [phone] Message from:  [facility] on December 04, 2009 3:31 PM  Refills Requested: Medication #1:  HYDROCODONE-ACETAMINOPHEN 10-650 MG TABS Take 1 tablet by mouth every 4 hours as needed for pain   Last Refilled: 11/17/2009  Method Requested: Telephone to Pharmacy  Follow-up for Phone Call        Note refill date will not refill. Dr. Ermalene Searing PCP. Spencer Copland MD  December 04, 2009 3:43 PM   Additional Follow-up for Phone Call Additional follow up Details #1::        Okay ot refill gets 20 extra tablets to last 120 per month given insurance will not accept prescription greater than100 at a time.  USing Percocet for breakthrough. Additional Follow-up by: Kerby Nora MD,  December 04, 2009 10:59 PM    Additional Follow-up for Phone Call Additional follow up Details #2::    rx called to office.Consuello Masse CMA   Follow-up by: Benny Lennert CMA Duncan Dull),  December 05, 2009 8:29 AM  Prescriptions: HYDROCODONE-ACETAMINOPHEN 10-650 MG TABS (HYDROCODONE-ACETAMINOPHEN) Take 1 tablet by mouth every 4 hours as needed for pain  #20 x 0   Entered and Authorized by:   Kerby Nora MD   Signed by:   Kerby Nora MD on 12/04/2009   Method used:   Telephoned to ...       [facility] ([facility]ail[facility]       [address]       [place], [place]  [address]       Ph: [phone] or [phone]       Fax: [phone]   RxID:   [id number]

## 2010-02-20 NOTE — Progress Notes (Signed)
Summary: refill request for vicodin  Phone Note Refill Request Call back at Home Phone [phone] Message from:  Patient  Refills Requested: Medication #1:  HYDROCODONE-ACETAMINOPHEN 10-650 MG TABS Take 1 tablet by mouth every 4 hours as needed for pain Please send to [facility], [phone].  Initial call taken by: Lowella Petties CMA, AAMA,  November 17, 2009 12:04 PM  Follow-up for Phone Call        Rx called to pharmacy Follow-up by: Benny Lennert CMA Duncan Dull),  November 17, 2009 2:39 PM    Prescriptions: HYDROCODONE-ACETAMINOPHEN 10-650 MG TABS (HYDROCODONE-ACETAMINOPHEN) Take 1 tablet by mouth every 4 hours as needed for pain  #100 x 0   Entered and Authorized by:   Kerby Nora MD   Signed by:   Kerby Nora MD on 11/17/2009   Method used:   Telephoned to ...       [facility] (retail)       [address]       [place], [place]  [address]       Ph: [phone] or [phone]       Fax: [phone]   RxID:   [id number]

## 2010-02-20 NOTE — Progress Notes (Signed)
Summary: hydrocodone  Phone Note Refill Request Call back at Home Phone [phone] Message from:  Patient on July 28, 2009 12:09 PM  Refills Requested: Medication #1:  HYDROCODONE-ACETAMINOPHEN 10-650 MG TABS Take 1 tablet by mouth every 4 hours as needed for pain Patient is asking for a written script. He would like to come by and pick it up this afternoon if possible. Please call when ready.    Method Requested: Pick up at Office Initial call taken by: Melody Comas,  July 28, 2009 12:09 PM  Follow-up for Phone Call        Gets 100 tabs and 20 tabs in 2 rx's because insurance coverage. Was only given 100 tabs on 6/20 by Copland.  Let pt know next refill is 7/20 for #100 and #20 for month of July.  Follow-up by: Kerby Nora MD,  July 28, 2009 2:14 PM  Additional Follow-up for Phone Call Additional follow up Details #1::        patient advised.Consuello Masse CMA   Additional Follow-up by: Benny Lennert CMA Duncan Dull),  July 28, 2009 2:55 PM    Prescriptions: HYDROCODONE-ACETAMINOPHEN 10-650 MG TABS (HYDROCODONE-ACETAMINOPHEN) Take 1 tablet by mouth every 4 hours as needed for pain  #20 x 0   Entered and Authorized by:   Kerby Nora MD   Signed by:   Kerby Nora MD on 07/28/2009   Method used:   Print then Give to Patient   RxID:   [id number]

## 2010-02-20 NOTE — Assessment & Plan Note (Signed)
Summary: Discuss pain meds   Vital Signs:  Patient profile:   35 year old male Height:      76 inches Weight:      198.6 pounds BMI:     24.26 Temp:     98.0 degrees F oral Pulse rate:   88 / minute Pulse rhythm:   regular BP sitting:   140 / 88  (left arm) Cuff size:   large  Vitals Entered By: Benny Lennert CMA Duncan Dull) (March 22, 2009 8:45 AM)  History of Present Illness: Chief complaint discuss pain medications Last seen by me in 09/2007 for anxiety...referred to pshcychiatry for anxiety.  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 Since that time, he has had several bone grafts to his jaw. He has had problems with chronic pain. He had been controlled on  hydrocodone. Uses 3-4 times a day. Will run out this weeknd..has 19 tabs left.    He had also been changed to valium from xanax for his anxiety .Marland Kitchenby Dr. Evelene Croon pshychiatry. Sees her every 6 months..next appointment 05/12/2009  He was discharged from the pain center [facility], Dr. Laban Emperor)... states that he thinks it is because he has been having trouble paying. Also told he failed a drug test but  he states he does not use marijana, drugs.  Also saw Dr. Metta Clines in past  sent by last MD Dr. Bethena Midget...also told at that time that he was doctor jumping.  Tried on opana, but could not afford with his insurnce.   Pain in right head and over roight hip where bone graft came from.   Problems Prior to Update: 1)  Uri  (ICD-465.9) 2)  Tobacco Abuse  (ICD-305.1) 3)  Anxiety  (ICD-300.00) 4)  Depression  (ICD-311) 5)  Pain, Chronic Nec  (ICD-338.29)  Current Medications (verified): 1)  Hydrocodone-Acetaminophen 10-500 Mg Tabs (Hydrocodone-Acetaminophen) .... Take 1 Tablet By Mouth Every 6 Hours As Needed As Needed Pain 2)  Diazepam 10 Mg Tabs (Diazepam) .... Take 1 Tablet By Mouth Four Times A Day  Allergies (verified): No Known Drug Allergies  Past History:  Past medical, surgical,  family and social histories (including risk factors) reviewed, and no changes noted (except as noted below).  Past Surgical History: Reviewed history from 06/02/2006 and no changes required. 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 Father: Alive 49 healthy Mother: Alive 69, high chol, HTN Siblings: 4 brothers healthy  Social History: Reviewed history from 06/02/2006 and no changes required. Marital Status: Single - in relationship, + sex, + condom Children: None Occupation: Insurance claims handler, IT sales professional Exercise:  None, running causes head pain Diet:  FF once daily, + fruits and veggies, + H20, + Soda Current Smoker 1-2 PPD x 10 years Alcohol use-yes occasional Drug use-no  Review of Systems General:  Denies fatigue and fever. CV:  Denies chest pain or discomfort. Resp:  Denies shortness of breath. GI:  Denies abdominal pain. GU:  Denies dysuria.  Physical Exam  General:  Well-developed,well-nourished,in no acute distress; alert,appropriate and cooperative throughout examination Eyes:  swelling in face around eyes..baseline for pt.  Mouth:  Oral mucosa and oropharynx without lesions or exudates.  Teeth in good repair. MMM Neck:  no carotid bruit or thyromegaly no cervical or supraclavicular lymphadenopathy  Lungs:  Normal respiratory effort, chest expands symmetrically. Lungs are clear to auscultation, no crackles or wheezes. Heart:  Normal rate and regular rhythm. S1 and S2 normal without gallop, murmur, click, rub or other extra sounds. Msk:  scar on right scalp, tenderto palpation Pulses:  R and L posterior tibial pulses are full and equal bilaterally  Extremities:  no edmea  Psych:  Cognition and judgment appear intact. Alert and cooperative with  normal attention span and concentration. No apparent delusions, illusions, hallucinations   Impression & Recommendations:  Problem # 1:  PAIN, CHRONIC NEC (ICD-338.29) Discussed my concerns in detatil with patient about his being discharged from 2 pain centers.Marland KitchenMarland KitchenI will contact Dr. Laban Emperor to find out what his concerns were. Per pt he has agood relationship with Dr. Evelene Croon.Marland Kitchenand I will verify this.  Depending on the reports from these MDs.Marland KitchenMarland KitchenI will consider seeing him monthly initially to provided pain medicaitons. Pt understandes that he would have to sign a pain contract with [place] as well.   Problem # 2:  ANXIETY (ICD-300.00) Stable per pt.  His updated medication list for this problem includes:    Diazepam 10 Mg Tabs (Diazepam) .Marland Kitchen... Take 1 tablet by mouth four times a day  Complete Medication List: 1)  Hydrocodone-acetaminophen 10-500 Mg Tabs (Hydrocodone-acetaminophen) .... Take 1 tablet by mouth every 6 hours as needed as needed pain 2)  Diazepam 10 Mg Tabs (Diazepam) .... Take 1 tablet by mouth four times a day  Prior Medications (reviewed today): DIAZEPAM 10 MG TABS (DIAZEPAM) Take 1 tablet by mouth four times a day Current Allergies (reviewed today): No known allergies   Appended Document: Discuss pain meds Spoke in detail with Dr. Laban Emperor. He states patient has kept appointments, has used only one drug store and has not failed any drug tests. He believes that letter discharging the patient was written incorrectly. He should not have been discharged for any reason other that he was not able to pay bill. I relayed this info to the patient. He has aggreed to be seen by Dr. Laban Emperor one last time, pain med prescritpion will be written for 1 month and documentation will be corrected by Dr. Laban Emperor. Pt will continue to see Dr. Evelene Croon. He will come in monthly in next few months for pain medication prescriptions..to assure his stability, then we will space this to every 3 months. He will sign  pain contract.

## 2010-02-20 NOTE — Progress Notes (Signed)
Summary: Request stronger pain med  Phone Note Call from Patient Call back at [phone]   Caller: Patient Call For: Kerby Nora MD Summary of Call: Patient is taking Hydrocodone 1 &1/2 to 2 every 4-5 hours and they are not strong enough. Patient wants to know if you can give him a rx for Percocet. Patient has taken Percocet 10mg  in the past and that has helped. [facility] Initial call taken by: Sydell Axon LPN,  November 07, 2009 8:13 AM  Follow-up for Phone Call        As discssed in past..if he is interested in tittration of his pain med he will have to return to pain MD.  Follow-up by: Kerby Nora MD,  November 07, 2009 9:14 AM  Additional Follow-up for Phone Call Additional follow up Details #1::        Patient advised and said that his old pain doctor had referred him back to you for his rx and he didnt know if he could do the percocet for one month then go back to the vicodin Additional Follow-up by: Benny Lennert CMA Duncan Dull),  November 07, 2009 11:17 AM    Additional Follow-up for Phone Call Additional follow up Details #2::    Seen Dr. Georgeanna Harrison in past...this Dr. states he does not have enough manpower to manage narcotics.  Have pt make appt to discuss pain med adjustment. Follow-up by: Kerby Nora MD,  November 07, 2009 1:51 PM  Additional Follow-up for Phone Call Additional follow up Details #3:: Details for Additional Follow-up Action Taken: Patietn advised.Consuello Masse CMA    Appt made Additional Follow-up by: Benny Lennert CMA Duncan Dull),  November 07, 2009 1:59 PM

## 2010-02-20 NOTE — Letter (Signed)
Summary: [facility]  [facility]   Imported By: Maryln Gottron 04/10/2009 10:45:00  _____________________________________________________________________  External Attachment:    Type:   Image     Comment:   External Document

## 2010-02-20 NOTE — Assessment & Plan Note (Signed)
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 Since that time, he has had several bone grafts to his jaw. He has had problems with chronic pain. He had been controlled on  hydrocodone.   He had also been changed to valium from xanax for his anxiety .Marland Kitchenby Dr. Evelene Croon pshychiatry. Sees her every 6 months..last appointment 05/12/2009  Spoke in detail with [facility]... Dr. Laban Emperor. He states patient has kept appointments, has used only one drug store and has not failed any drug tests. He believes that letter discharging the patient was written incorrectly. He should not have been discharged for any reason other that he was not able to pay bill. I relayed this info to the patient. Pt will continue to see Dr. Evelene Croon.  In last 2 months he reports that right hip pain where bone graft was. mre over right buttock though Constant throbbing stinging pain, wakes him up from sleep at times. Has started working longer hours in last 1-2 months...the longer he stands the worse it gets. No particular movement makes it hurt the most. No numbness, no leg weakness. no fever.  In past he has had similar flares where he temporarily needs stronger dose med.  On maximum hydrocodone... using 1 and 1/2 tabs to 2 tabs ever 4 hours. No constipation.   Allergies (verified): No Known Drug Allergies  Review of Systems General:  Denies fatigue and fever. CV:  Denies chest pain or discomfort. Resp:  Denies shortness of breath.  Physical Exam  General:   Well-developed,well-nourished,in no acute distress; alert,appropriate and cooperative throughout examination VSS, non toxic appearing. Mouth:  Oral mucosa and oropharynx without lesions or exudates.  Teeth in good repair. Lungs:  Normal respiratory effort, chest expands symmetrically. Lungs are clear to auscultation, no crackles or wheezes. Heart:  Normal rate and regular rhythm. S1 and S2 normal without gallop, murmur, click, rub or other extra sounds. Msk:  ttp in sciatic nothc..not as tender over scar higher up in lumbar back. No vertebral pain.  Pain with right RLS and Faber's Pulses:  R and L posterior tibial pulses are full and equal bilaterally  Neurologic:  No cranial nerve deficits noted. Station and gait are normal. Plantar reflexes are down-going bilaterally. DTRs are symmetrical throughout. Sensory, motor and coordinative functions appear intact.   Impression & Recommendations:  Problem # 1:  SCIATICA, RIGHT (ICD-724.3) PAin most consistent with sciatic.Marland Kitchenless with area where bone graft was although this is still possible.  Start with conservative treatement...steroid taper, gentle exercsies and heat. Call if not imrpoving.   His updated medication list for this problem includes:    Hydrocodone-acetaminophen 10-650 Mg Tabs (Hydrocodone-acetaminophen) .Marland Kitchen... Take 1 tablet by mouth every 4 hours as needed for pain  Problem # 2:  PAIN, CHRONIC NEC (ICD-338.29) Counsled pt we need to avoid escalation of pain med if possible.. may need percocet temporarily if not improving.  Complete Medication List: 1)  Hydrocodone-acetaminophen 10-650 Mg Tabs (Hydrocodone-acetaminophen) .... Take 1 tablet by mouth every 4 hours as needed for pain 2)  Diazepam 10 Mg Tabs (Diazepam) .... Take 1 tablet by mouth four times a day 3)  Bupropion Hcl 150 Mg Xr12h-tab (Bupropion hcl) .Marland Kitchen.. 1 tab by mouth two times a day 4)  Prednisone 20 Mg Tabs (Prednisone) .... 3 tabs by mouth daily x 3 days, then 2 tabs  by mouth daily x 2 days then 1 tab by mouth daily x 2 days  Patient Instructions: 1)  Start with heat on the area Daily. 2)   Take course of prednsione. 3)  Start gentle stretching exercises.  4)   Call if pain not improved in 1-2 weeks. Prescriptions: PREDNISONE 20 MG TABS (PREDNISONE) 3 tabs by mouth daily x 3 days, then 2 tabs by mouth daily x 2 days then 1 tab by mouth daily x 2 days  #15 x 0   Entered and Authorized by:   Kerby Nora MD   Signed by:   Kerby Nora MD on 11/14/2009   Method used:   [facility]        [facility] [id number]       [address]       [place], [place]  [address]       Ph: [phone] or [phone]       Fax: [phone]   RxID:   [id number]    Orders Added: 1)  Est. Patient Level III [37628]    Current Allergies (reviewed today): No known allergies

## 2010-02-20 NOTE — Assessment & Plan Note (Signed)
Summary: COUGH/CLE   Vital Signs:  Patient profile:   35 year old male Height:      76 inches Weight:      206.25 pounds BMI:     25.20 Temp:     98 degrees F oral Pulse rate:   88 / minute Pulse rhythm:   regular BP sitting:   132 / 76  (left arm) Cuff size:   large  Vitals Entered By: Delilah Shan CMA Duncan Dull) (January 30, 2009 11:43 AM) CC: Deep cough   History of Present Illness: 35 yo with 1 day of dry cough. Tried OTC robitussin with no relief of symptoms. Mild nasal discharge. No fevers, chills, sore throat, or ear pain. No n/v/d. No rashes.  Current Medications (verified): 1)  Hydrocodone-Acetaminophen 10-500 Mg Tabs (Hydrocodone-Acetaminophen) .... Take 1 Tablet By Mouth Every 6 Hours As Needed 2)  Diazepam 10 Mg Tabs (Diazepam) .... Take 1 Tablet By Mouth Four Times A Day  Allergies (verified): No Known Drug Allergies  Review of Systems      See HPI  Physical Exam  General:  Well-developed,well-nourished,in no acute distress; alert,appropriate and cooperative throughout examination Nose:  External nasal examination shows no deformity or inflammation. Nasal mucosa are pink and moist without lesions or exudates. Mouth:  Oral mucosa and oropharynx without lesions or exudates.  Teeth in good repair. Lungs:  Normal respiratory effort, chest expands symmetrically. Lungs are clear to auscultation, no crackles or wheezes. Heart:  Normal rate and regular rhythm. S1 and S2 normal without gallop, murmur, click, rub or other extra sounds.   Impression & Recommendations:  Problem # 1:  URI (ICD-465.9) Assessment New Likely viral.  Pt asking for prescription strength cough medicine which I feel is unnecessary after history and physical examination. Continue supportive care.  See patient instructions for details.  Complete Medication List: 1)  Hydrocodone-acetaminophen 10-500 Mg Tabs (Hydrocodone-acetaminophen) .... Take 1 tablet by mouth every 6 hours as needed 2)   Diazepam 10 Mg Tabs (Diazepam) .... Take 1 tablet by mouth four times a day  Patient Instructions: 1)  Recommended voice rest for laryngitis, Warm honey tea.  Drink lots of fluids.  Treat sympotmatically  with guafenesin, nasal saline irrigation, and Tylenol.  Cough suppressant at night. Call if not improving as expected in 10-14 days as on viral timeline.   Current Allergies (reviewed today): No known allergies

## 2010-02-20 NOTE — Progress Notes (Signed)
Summary: vicodin   Phone Note Refill Request Call back at Home Phone [phone] Message from:  Patient on December 19, 2009 11:05 AM  Refills Requested: Medication #1:  HYDROCODONE-ACETAMINOPHEN 10-650 MG TABS Take 1 tablet by mouth every 4 hours as needed for pain Please call patient when ready for pick up.    Method Requested: Pick up at Office Initial call taken by: Melody Comas,  December 19, 2009 11:05 AM  Follow-up for Phone Call        Last prescription should ahve ber 100 tabs..will fill 70 to last month. Follow-up by: Kerby Nora MD,  December 19, 2009 11:27 AM  Additional Follow-up for Phone Call Additional follow up Details #1::        [facility]   Additional Follow-up by: Lowella Petties CMA, AAMA,  December 19, 2009 1:03 PM    Prescriptions: HYDROCODONE-ACETAMINOPHEN 10-650 MG TABS (HYDROCODONE-ACETAMINOPHEN) Take 1 tablet by mouth every 4 hours as needed for pain  #70 x 0   Entered and Authorized by:   Kerby Nora MD   Signed by:   Kerby Nora MD on 12/19/2009   Method used:   Telephoned to ...       [facility]       [address]       [place], [place]  [address]       Ph: [phone] or [phone]       Fax: [phone]   RxID:   [id number]

## 2010-02-20 NOTE — Assessment & Plan Note (Signed)
Summary: COLD AND COUGH / LFW   Vital Signs:  Patient profile:   35 year old male Height:      76 inches Weight:      196.50 pounds BMI:     24.01 Temp:     97.8 degrees F oral Pulse rate:   96 / minute Pulse rhythm:   regular BP sitting:   130 / 80  (left arm) Cuff size:   regular  Vitals Entered By: [name] CMA (AAMA) (August 15, 2009 8:00 AM) CC: cough, cold   History of Present Illness: 35 yo with 3 days of productive cough, runny nose, malaise. No fever chills, SOB or wheezing. Taking Mucinex OTC with no relief of symptoms.       Current Medications (verified): 1)  Hydrocodone-Acetaminophen 10-650 Mg Tabs (Hydrocodone-Acetaminophen) .... Take 1 Tablet By Mouth Every 4 Hours As Needed For Pain 2)  Diazepam 10 Mg Tabs (Diazepam) .... Take 1 Tablet By Mouth Four Times A Day 3)  Tessalon Perles 100 Mg  Caps (Benzonatate) .Marland Kitchen.. 1 Tab By Mouth Three Times A Day As Needed Cough.  Allergies (verified): No Known Drug Allergies  Past History:  Past Surgical History: Last updated: 06/02/2006 01/2002 Melioblastoma, lower jaw removed 2004 Bone graft from hip to jaw 2006 Bone graft right side of head to jaw          Sphenopalatine ganglionic 03/27/06 block procedure at pain center  Family History: Last updated: 06/02/2006 Father: Alive 24 healthy Mother: Alive 38, high chol, HTN Siblings: 4 brothers healthy  Social History: Last updated: 06/02/2006 Marital Status: Single - in relationship, + sex, + condom Children: None Occupation: Insurance claims handler, IT sales professional Exercise:  None, running causes head pain Diet:  FF once daily, + fruits and veggies, + H20, + Soda Current Smoker 1-2 PPD x 10 years Alcohol use-yes occasional Drug use-no  Risk Factors: Smoking Status: current (06/02/2006)  Review of Systems      See HPI General:  Complains of malaise; denies chills and fever. ENT:  Complains of sinus pressure and sore throat. CV:  Denies chest pain or  discomfort. Resp:  Complains of cough and sputum productive; denies shortness of breath and wheezing.  Physical Exam  General:  Well-developed,well-nourished,in no acute distress; alert,appropriate and cooperative throughout examination VSS, non toxic appearing. Ears:  R ear normal and L ear normal.   Nose:  mucosal erythema.   Mouth:  pharyngeal erythema.   Lungs:  Normal respiratory effort, chest expands symmetrically. Lungs are clear to auscultation, no crackles or wheezes. Heart:  Normal rate and regular rhythm. S1 and S2 normal without gallop, murmur, click, rub or other extra sounds. Psych:  Cognition and judgment appear intact. Alert and cooperative with normal attention span and concentration. No apparent delusions, illusions, hallucinations   Impression & Recommendations:  Problem # 1:  URI (ICD-465.9) Recommended voice rest for laryngitis, Warm honey tea.  Drink lots of fluids.  Treat sympotmatically  with guafenesin, nasal saline irrigation, and Tylenol.  Cough suppressant at night. Call if not improving aas expected in 10-14 days as on viral timeline.    His updated medication list for this problem includes:    Tessalon Perles 100 Mg Caps (Benzonatate) .Marland Kitchen... 1 tab by mouth three times a day as needed cough.  Complete Medication List: 1)  Hydrocodone-acetaminophen 10-650 Mg Tabs (Hydrocodone-acetaminophen) .... Take 1 tablet by mouth every 4 hours as needed for pain 2)  Diazepam 10 Mg Tabs (Diazepam) .... Take  1 tablet by mouth four times a day 3)  Tessalon Perles 100 Mg Caps (Benzonatate) .Marland Kitchen.. 1 tab by mouth three times a day as needed cough. Prescriptions: TESSALON PERLES 100 MG  CAPS (BENZONATATE) 1 tab by mouth three times a day as needed cough.  #30 x 0   Entered and Authorized by:   Ruthe Mannan MD   Signed by:   Ruthe Mannan MD on 08/15/2009   Method used:   [facility]        [facility]       [address]       [place],  [place]  [address]       Ph: [phone] or [phone]       Fax: [phone]   RxID:   [id number]   Current Allergies (reviewed today): No known allergies

## 2010-02-22 NOTE — Assessment & Plan Note (Signed)
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 Since that time, he has had several bone grafts to his jaw. He has had problems with chronic pain. He had been controlled on  hydrocodone.   He had also been changed to valium from xanax for his anxiety .Marland Kitchenby Dr. Evelene Croon pshychiatry. Sees her every 6 months..last appointment 05/12/2009 He states this is stable.   Spoke in detail with [facility]... Dr. Laban Emperor. He states patient has kept appointments, has used only one drug store and has not failed any drug tests. He believes that letter discharging the patient was written incorrectly. He should not have been discharged for any reason other that he was not able to pay bill. I relayed this info to the patient. He has agreed to be seen by Dr. Laban Emperor one last time, pain med prescritpion will be written for 1 month and documentation will be corrected by Dr. Laban Emperor....saw Dr. Dorris Carnes on 03/27/2009...given RX for 3 months prescription. Pt will continue to see Dr. Evelene Croon. He will come in monthly in next few months for pain medication prescriptions..to assure his stability, then we will space this to every 3 months. He will sign pain contract when we have these avaialble.   TODAY: In last 3 few months.. treatment for possible sciatica with steroids did not help. Increase in pain near graft source site in right buttock continues. he has neeeded additional percocet for breakthrough pain... using about every morning. Also continues to use vicodin  for maintanance pain.   Has been able to decrease to 2 cigarettes a day.  Working on low sugar diet given prediabetes... has decrease [facility].  2 weeks ago crush injury to right 4th digit...PIP joint. Hit with 1 and 1/2 brass hammer at work.  Since then tender in PIP joint medially.Marland Kitchenswelling initially, slight  bruising medially.  Cannot straighten figer all the way and pain with flexion.   Problems Prior to Update: 1)  Finger Pain  (ICD-729.5) 2)  Sciatica, Right  (ICD-724.3) 3)  Preventive Health Care  (ICD-V70.0) 4)  Hypercholesterolemia  (ICD-272.0) 5)  Chest Pain, Atypical  (ICD-786.59) 6)  Prediabetes  (ICD-790.29) 7)  Screening For Lipoid Disorders  (ICD-V77.91) 8)  Injury, Finger  (ICD-959.5) 9)  Uri  (ICD-465.9) 10)  Tobacco Abuse  (ICD-305.1) 11)  Anxiety  (ICD-300.00) 12)  Depression  (ICD-311) 13)  Pain, Chronic Nec  (ICD-338.29)  Current Medications (verified): 1)  Hydrocodone-Acetaminophen 10-650 Mg Tabs (Hydrocodone-Acetaminophen) .... Take 1 Tablet By Mouth Every 4 Hours As Needed For Pain 2)  Diazepam 10 Mg Tabs (Diazepam) .... Take 1 Tablet By Mouth Four Times A Day 3)  Percocet 5-325 Mg Tabs (Oxycodone-Acetaminophen) .Marland Kitchen.. 1tab By Mouth Daily As Needed Breakthrough Pain..conitnue To Use Vicodin For Regular Pain Management  Allergies (verified): No Known Drug Allergies  Past History:  Past medical, surgical, family and social histories (including  risk factors) reviewed, and no changes noted (except as noted below).  Past Surgical History: Reviewed history from 06/02/2006 and no changes required. 01/2002 Melioblastoma, lower jaw removed 2004 Bone graft from hip to jaw 2006 Bone graft right side of head to jaw          Sphenopalatine ganglionic 03/27/06 block procedure at pain center  Family History: Reviewed history from 10/13/2009 and no changes required. Father: Alive 52 carotid stenosis Mother: Alive 34, high chol, HTN Siblings: 4 brothers  healthy  Social History: Reviewed history from 06/02/2006 and no changes required. Marital Status: Single - in relationship, + sex, + condom Children: None Occupation: Insurance claims handler, IT sales professional Exercise:  None, running causes head pain Diet:  FF once daily, + fruits and veggies, + H20, + Soda Current Smoker 1-2 PPD x 10 years Alcohol use-yes occasional Drug use-no  Review of Systems General:  Denies fatigue and fever. CV:  Denies chest pain or discomfort. Resp:  Denies cough. GI:  Denies abdominal pain, bloody stools, constipation, and diarrhea. GU:  Denies dysuria.  Physical Exam  General:  Well-developed,well-nourished,in no acute distress; alert,appropriate and cooperative throughout examination VSS, non toxic appearing. Mouth:  Oral mucosa and oropharynx without lesions or exudates.  Teeth in good repair. Neck:  no carotid bruit or thyromegaly no cervical or supraclavicular lymphadenopathy  Lungs:  Normal respiratory effort, chest expands symmetrically. Lungs are clear to auscultation, no crackles or wheezes. Heart:  Normal rate and regular rhythm. S1 and S2 normal without gallop, murmur, click, rub or other extra sounds. Msk:  right PIP joint  small discoloration purple and  tender at medial aspect. able to flex and bend with pain... full strength.   Pulses:  R and L posterior tibial pulses are full and equal bilaterally  Extremities:  no edema  Psych:  Cognition and judgment appear intact. Alert and cooperative with normal attention span and concentration. No apparent delusions, illusions, hallucinations   Impression & Recommendations:  Problem # 1:  PAIN, CHRONIC NEC (ICD-338.29) Assessment Improved Improved control with additional dose of percocet in AMs. Continue vicodin. No violations of pain contract.  to avoid over use of tylenol.. we will cahnge to oxycodone instead of percocet at next refill.   Problem # 2:  FINGER PAIN (ICD-729.5) Assessment:  New  Orders: T-Hand Right 3 views (73130TC)  Complete Medication List: 1)  Hydrocodone-acetaminophen 10-650 Mg Tabs (Hydrocodone-acetaminophen) .... Take 1 tablet by mouth every 4 hours as needed for pain 2)  Diazepam 10 Mg Tabs (Diazepam) .... Take 1 tablet by mouth four times a day 3)  Percocet 5-325 Mg Tabs (Oxycodone-acetaminophen) .Marland Kitchen.. 1tab by mouth daily as needed breakthrough pain..conitnue to use vicodin for regular pain management  Patient Instructions: 1)  Please schedule a follow-up appointment in 3 months .  Prescriptions: HYDROCODONE-ACETAMINOPHEN 10-650 MG TABS (HYDROCODONE-ACETAMINOPHEN) Take 1 tablet by mouth every 4 hours as needed for pain  #20 x 0   Entered and Authorized by:   Kerby Nora MD   Signed by:   Kerby Nora MD on 01/02/2010   Method used:   Print then Give to Patient   RxID:   [id number] HYDROCODONE-ACETAMINOPHEN 10-650 MG TABS (HYDROCODONE-ACETAMINOPHEN) Take 1 tablet by mouth every 4 hours as needed for pain  #100 x 0   Entered and Authorized by:   Kerby Nora MD   Signed by:   Kerby Nora MD on 01/02/2010   Method used:   Print then Give to Patient  RxID:   [id number]    Orders Added: 1)  T-Hand Right 3 views [73130TC] 2)  Est. Patient Level IV [52841]    Current Allergies (reviewed today): No known allergies

## 2010-02-22 NOTE — Progress Notes (Signed)
Summary: hydrocdone  Phone Note Refill Request Call back at [phone] Message from:  Patient on January 31, 2010 8:07 AM  Refills Requested: Medication #1:  HYDROCODONE-ACETAMINOPHEN 10-650 MG TABS Take 1 tablet by mouth every 4 hours as needed for pain Patient says that he normally gets this for #100.   Initial call taken by: Melody Comas,  January 31, 2010 8:07 AM  Follow-up for Phone Call        Will forward to the patient's primary care provider. (narcotics) Follow-up by: Hannah Beat MD,  January 31, 2010 8:09 AM  Additional Follow-up for Phone Call Additional follow up Details #1::        He gets 20 and 100 tabs..seperate prescriptions each month due to insurance.  Additional Follow-up by: Kerby Nora MD,  January 31, 2010 10:05 PM    Additional Follow-up for Phone Call Additional follow up Details #2::    rx sent to pharmacy.Consuello Masse CMA   Follow-up by: Benny Lennert CMA Duncan Dull),  February 01, 2010 7:53 AM  Prescriptions: HYDROCODONE-ACETAMINOPHEN 10-650 MG TABS (HYDROCODONE-ACETAMINOPHEN) Take 1 tablet by mouth every 4 hours as needed for pain  #100 x 0   Entered and Authorized by:   Kerby Nora MD   Signed by:   Kerby Nora MD on 01/31/2010   Method used:   Telephoned to ...       [facility]  [address] [facility])       [address]       [place], [place]  [address]       Ph: [phone] or [phone]       Fax: [phone]   RxID:   [id number]

## 2010-02-22 NOTE — Consult Note (Signed)
Summary: [facility]  [facility]   Imported By: Lanelle Bal 01/16/2010 14:43:32  _____________________________________________________________________  External Attachment:    Type:   Image     Comment:   External Document

## 2010-02-22 NOTE — Progress Notes (Signed)
Summary: refill request for vicodin  Phone Note Refill Request Call back at Home Phone [phone] Message from:  Patient  Refills Requested: Medication #1:  HYDROCODONE-ACETAMINOPHEN 10-650 MG TABS Take 1 tablet by mouth every 4 hours as needed for pain [facility].  Please let pt know when called in.  Pt knows Dr. Ermalene Searing is out of the office today.  Initial call taken by: Lowella Petties CMA, AAMA,  January 01, 2010 12:05 PM  Follow-up for Phone Call        Given at appt today. Follow-up by: Kerby Nora MD,  January 02, 2010 10:23 AM

## 2010-02-22 NOTE — Progress Notes (Signed)
Summary: refill request for vicodin, percocet  Phone Note Refill Request Call back at Home Phone [phone] Message from:  Patient  Refills Requested: Medication #1:  HYDROCODONE-ACETAMINOPHEN 10-650 MG TABS Take 1 tablet by mouth every 4 hours as needed for pain  Medication #2:  PERCOCET 5-325 MG TABS 1tab by mouth daily as needed breakthrough pain..conitnue to use vicodin for regular pain management. Please call pt when ready.  Pt will run out of vicodin on tuesday.  Initial call taken by: Lowella Petties CMA, AAMA,  January 25, 2010 8:03 AM  Follow-up for Phone Call        Okay to call in percocet, but early for vicodin...due for refill on 1/13.  Printed out percocet but please wait and call in vicodin on 1/9. Follow-up by: Kerby Nora MD,  January 26, 2010 8:22 AM  Additional Follow-up for Phone Call Additional follow up Details #1::        Advised pt that percocet script is ready but we will have to wait until monday to call in vicodin.  He wants [facility] to [facility]. Additional Follow-up by: Lowella Petties CMA, AAMA,  January 26, 2010 8:25 AM    Additional Follow-up for Phone Call Additional follow up Details #2::    Patient picked up percocet rx today  Follow-up by: Benny Lennert CMA Duncan Dull),  January 26, 2010 8:50 AM  Additional Follow-up for Phone Call Additional follow up Details #3:: Details for Additional Follow-up Action Taken: Can you fill the vicodin b/c dr Ermalene Searing forgot to do on friday    doing now. Hannah Beat MD  January 29, 2010 9:37 AM   Patient advised rx called in Additional Follow-up by: Benny Lennert CMA Duncan Dull),  January 29, 2010 8:57 AM  Prescriptions: HYDROCODONE-ACETAMINOPHEN 10-650 MG TABS (HYDROCODONE-ACETAMINOPHEN) Take 1 tablet by mouth every 4 hours as needed for pain  #20 x 0   Entered and Authorized by:   Hannah Beat MD   Signed by:   Hannah Beat MD on 01/29/2010   Method used:   Telephoned to ...       [facility] ([facility]ail)       [address]       [place], [place]  [address]       Ph: [phone] or [phone]       Fax: [phone]   RxID:   [id number] PERCOCET 5-325 MG TABS (OXYCODONE-ACETAMINOPHEN) 1tab by mouth daily as needed breakthrough pain..conitnue to use vicodin for regular pain management  #30 x 0   Entered and Authorized by:   Kerby Nora MD   Signed by:   Kerby Nora MD on 01/26/2010   Method used:   Print then Mail to Patient   RxID:   [id number]

## 2010-02-27 ENCOUNTER — Telehealth: Payer: Self-pay | Admitting: Family Medicine

## 2010-02-28 NOTE — Progress Notes (Signed)
Summary: needs refill on vicodin  Phone Note Refill Request Call back at Home Phone [phone] Message from:  Patient  Refills Requested: Medication #1:  HYDROCODONE-ACETAMINOPHEN 10-650 MG TABS Take 1 tablet by mouth every 4 hours as needed for pain [facility] to [facility].  Pt is going out of town and needs an early refill- his grandmother has had a stroke and he is going to [place].  Initial call taken by: Lowella Petties CMA, AAMA,  February 19, 2010 12:10 PM  Follow-up for Phone Call        Rx called to pharmacy Follow-up by: Benny Lennert CMA Duncan Dull),  February 19, 2010 1:57 PM    Prescriptions: HYDROCODONE-ACETAMINOPHEN 10-650 MG TABS (HYDROCODONE-ACETAMINOPHEN) Take 1 tablet by mouth every 4 hours as needed for pain  #20 x 0   Entered and Authorized by:   Kerby Nora MD   Signed by:   Kerby Nora MD on 02/19/2010   Method used:   Telephoned to ...       [id number] ([facility]ail)       [address]       [place], [place]  [address]       Ph: [phone] or [phone]       Fax: [phone]   RxID:   [id number] HYDROCODONE-ACETAMINOPHEN 10-650 MG TABS (HYDROCODONE-ACETAMINOPHEN) Take 1 tablet by mouth every 4 hours as needed for pain  #100 x 0   Entered and Authorized by:   Kerby Nora MD   Signed by:   Kerby Nora MD on 02/19/2010   Method used:   Telephoned to ...       [facility] (retail)       [address]       [place], [place]  [address]       Ph: [phone] or [phone]       Fax: [phone]   RxID:   [id number]

## 2010-03-08 NOTE — Progress Notes (Signed)
Summary: refill request for percocet  Phone Note Refill Request Call back at Home Phone [phone] Message from:  Patient  Refills Requested: Medication #1:  PERCOCET 5-325 MG TABS 1tab by mouth daily as needed breakthrough pain..conitnue to use vicodin for regular pain management. Please call pt when ready.  Initial call taken by: Lowella Petties CMA, AAMA,  February 27, 2010 9:10 AM  Follow-up for Phone Call        Patient advised rx ready.Consuello Masse CMA   Follow-up by: Benny Lennert CMA Duncan Dull),  February 27, 2010 9:19 AM    Prescriptions: PERCOCET 5-325 MG TABS (OXYCODONE-ACETAMINOPHEN) 1tab by mouth daily as needed breakthrough pain..conitnue to use vicodin for regular pain management  #30 x 0   Entered and Authorized by:   Kerby Nora MD   Signed by:   Kerby Nora MD on 02/27/2010   Method used:   Print then Give to Patient   RxID:   [id number]

## 2010-03-19 ENCOUNTER — Telehealth: Payer: Self-pay | Admitting: Family Medicine

## 2010-03-27 ENCOUNTER — Telehealth: Payer: Self-pay | Admitting: Family Medicine

## 2010-03-29 NOTE — Progress Notes (Signed)
Summary: refill request for vicodin/ now wants written scripts  Phone Note Refill Request Call back at [phone] Message from:  Patient  Refills Requested: Medication #1:  HYDROCODONE-ACETAMINOPHEN 10-650 MG TABS Take 1 tablet by mouth every 4 hours as needed for pain Please call pt when ready.  Initial call taken by: Lowella Petties CMA, AAMA,  March 19, 2010 8:17 AM  Follow-up for Phone Call        Pt now wants written scripts for these, he will pick up shortly.              Lowella Petties CMA, AAMA  March 19, 2010 10:06 AM   Additional Follow-up for Phone Call Additional follow up Details #1::        Please make sure these were not call into pharm yet.  Additional Follow-up by: Kerby Nora MD,  March 19, 2010 10:21 AM    Additional Follow-up for Phone Call Additional follow up Details #2::    these have not been called into pharmacy yet and rx printed signed by dr and gice to patient.Consuello Masse CMA   Follow-up by: Benny Lennert CMA Duncan Dull),  March 19, 2010 10:27 AM  Prescriptions: HYDROCODONE-ACETAMINOPHEN 10-650 MG TABS (HYDROCODONE-ACETAMINOPHEN) Take 1 tablet by mouth every 4 hours as needed for pain  #100 x 0   Entered and Authorized by:   Kerby Nora MD   Signed by:   Kerby Nora MD on 03/19/2010   Method used:   Print then Give to Patient   RxID:   [id number] HYDROCODONE-ACETAMINOPHEN 10-650 MG TABS (HYDROCODONE-ACETAMINOPHEN) Take 1 tablet by mouth every 4 hours as needed for pain  #20 x 0   Entered and Authorized by:   Kerby Nora MD   Signed by:   Kerby Nora MD on 03/19/2010   Method used:   Print then Give to Patient   RxID:   [id number] HYDROCODONE-ACETAMINOPHEN 10-650 MG TABS (HYDROCODONE-ACETAMINOPHEN) Take 1 tablet by mouth every 4 hours as needed for pain  #20 x 0   Entered and Authorized by:   Kerby Nora MD   Signed by:   Kerby Nora MD on 03/19/2010   Method used:   Telephoned to ...       [id number]  ([facility]ail)       [address]       [place], [place]  [address]       Ph: [phone] or [phone]       Fax: [phone]   RxID:   [id number] HYDROCODONE-ACETAMINOPHEN 10-650 MG TABS (HYDROCODONE-ACETAMINOPHEN) Take 1 tablet by mouth every 4 hours as needed for pain  #100 x 0   Entered and Authorized by:   Kerby Nora MD   Signed by:   Kerby Nora MD on 03/19/2010   Method used:   Telephoned to ...       [facility] (retail)       [address]       [place], [place]  [address]       Ph: [phone] or [phone]       Fax: [phone]   RxID:   [id number]

## 2010-04-03 NOTE — Progress Notes (Signed)
Summary: percocet  Phone Note Refill Request Call back at [phone] Message from:  Fax from Pharmacy on March 27, 2010 11:30 AM  Refills Requested: Medication #1:  PERCOCET 5-325 MG TABS 1tab by mouth daily as needed breakthrough pain..conitnue to use vicodin for regular pain management.  Method Requested: Pick up at Office Initial call taken by: Melody Comas,  March 27, 2010 11:31 AM  Follow-up for Phone Call        Patient advised rx ready for pick up Follow-up by: Benny Lennert CMA Duncan Dull),  March 27, 2010 1:48 PM    Prescriptions: PERCOCET 5-325 MG TABS (OXYCODONE-ACETAMINOPHEN) 1tab by mouth daily as needed breakthrough pain..conitnue to use vicodin for regular pain management  #30 x 0   Entered and Authorized by:   Kerby Nora MD   Signed by:   Kerby Nora MD on 03/27/2010   Method used:   Print then Give to Patient   RxID:   [id number]

## 2010-04-06 ENCOUNTER — Telehealth: Payer: Self-pay | Admitting: Family Medicine

## 2010-04-06 ENCOUNTER — Ambulatory Visit: Payer: Self-pay | Admitting: Family Medicine

## 2010-04-10 NOTE — Progress Notes (Signed)
Summary: vicodin  Phone Note Refill Request Message from:  Patient on April 06, 2010 8:31 AM  Refills Requested: Medication #1:  HYDROCODONE-ACETAMINOPHEN 10-650 MG TABS Take 1 tablet by mouth every 4 hours as needed for pain   Supply Requested: 1 month  Method Requested: Pick up at Office Initial call taken by: Benny Lennert CMA Duncan Dull),  April 06, 2010 8:32 AM  Follow-up for Phone Call        refill not due until closer to 3/27 Follow-up by: Kerby Nora MD,  April 06, 2010 5:03 PM  Additional Follow-up for Phone Call Additional follow up Details #1::        Patient was looking at wrong hydrocodone bottle Additional Follow-up by: Benny Lennert CMA Duncan Dull),  April 06, 2010 5:04 PM

## 2010-04-16 ENCOUNTER — Other Ambulatory Visit: Payer: Self-pay | Admitting: *Deleted

## 2010-04-16 MED ORDER — HYDROCODONE-ACETAMINOPHEN 10-660 MG PO TABS: ORAL_TABLET | ORAL | Status: DC

## 2010-04-16 NOTE — Telephone Encounter (Signed)
Dr. Ermalene Searing out of town.  OK to refill #28

## 2010-04-16 NOTE — Telephone Encounter (Signed)
rx called to pharmacy as requested.

## 2010-04-17 ENCOUNTER — Other Ambulatory Visit: Payer: Self-pay | Admitting: *Deleted

## 2010-04-17 MED ORDER — HYDROCODONE-ACETAMINOPHEN 10-660 MG PO TABS: ORAL_TABLET | ORAL | Status: DC

## 2010-04-17 NOTE — Telephone Encounter (Signed)
Pt receives 120 tabs per month in 2 prescription because of insurance coverage... To have yesterdays prescription corrected.. We will simply send in additional prescription for 96 tabs.  Next refill for 120 tabs will be on 4/27.

## 2010-04-17 NOTE — Telephone Encounter (Signed)
Pt is calling to ask when the rest of his monthly supply of vicodin will be called in.  28 were called in yesterday.  Please let him know.

## 2010-04-17 NOTE — Telephone Encounter (Signed)
rx called to pharmacy 

## 2010-04-25 ENCOUNTER — Other Ambulatory Visit: Payer: Self-pay | Admitting: *Deleted

## 2010-04-25 MED ORDER — OXYCODONE-ACETAMINOPHEN 5-325 MG PO TABS: ORAL_TABLET | ORAL | Status: DC

## 2010-04-25 NOTE — Telephone Encounter (Signed)
Patient notified that rx is ready to be picked up.  

## 2010-05-09 ENCOUNTER — Encounter: Payer: Self-pay | Admitting: Family Medicine

## 2010-05-10 ENCOUNTER — Encounter: Payer: Self-pay | Admitting: Family Medicine

## 2010-05-10 ENCOUNTER — Ambulatory Visit (INDEPENDENT_AMBULATORY_CARE_PROVIDER_SITE_OTHER): Payer: PRIVATE HEALTH INSURANCE | Admitting: Family Medicine

## 2010-05-10 ENCOUNTER — Telehealth: Payer: Self-pay | Admitting: *Deleted

## 2010-05-10 ENCOUNTER — Ambulatory Visit (INDEPENDENT_AMBULATORY_CARE_PROVIDER_SITE_OTHER)
Admission: RE | Admit: 2010-05-10 | Discharge: 2010-05-10 | Disposition: A | Discharge status: Home / Self Care | Payer: PRIVATE HEALTH INSURANCE | Source: Ambulatory Visit | Attending: Family Medicine | Admitting: Family Medicine

## 2010-05-10 ENCOUNTER — Encounter: Payer: Self-pay | Admitting: *Deleted

## 2010-05-10 VITALS — BP 120/84 | HR 80 | Temp 98.1°F | Wt 213.0 lb

## 2010-05-10 DIAGNOSIS — R51 Headache: Secondary | ICD-10-CM

## 2010-05-10 DIAGNOSIS — R519 Headache, unspecified: Secondary | ICD-10-CM | POA: Insufficient documentation

## 2010-05-10 MED ORDER — OXYCODONE-ACETAMINOPHEN 5-325 MG PO TABS: 1.0000 | ORAL_TABLET | Freq: Four times a day (QID) | ORAL | Status: DC | PRN

## 2010-05-10 MED ORDER — IOHEXOL 300 MG/ML  SOLN
80.0000 mL | Freq: Once | INTRAMUSCULAR | Status: AC | PRN
  Administered 2010-05-10: 80 mL via INTRAVENOUS

## 2010-05-10 NOTE — Assessment & Plan Note (Addendum)
We sent for CT head given prev surgery.  No sig changes noted on that.   He now has appointment 3:15 with Dr. Ermalene Searing on Monday.  Inc percocet to q6h in meantime.  New rx done.  Don't take vicodin in meantime.  The first and second percocet rx printed to day had the wrong sig.  They were both shredded by MD.    I don't know the cause of the pain.  He may have post surgical changes that have lead to inc in cutaneous nerve sensitivity.  I would use the percocet in the meantime to control the pain and then have him fu with PMD for long term plans. Okay for outpatient fu with negative imaging and normal neuro exam.  I called pt about the plan and the results.  >25 min spent with patient, at least half of which was spent on counseling patient on plan.

## 2010-05-10 NOTE — Telephone Encounter (Signed)
Called report from CT- no significant intra cranial abnormality, right frontal craniotomy flap doesn't show any significant abnormalities.

## 2010-05-10 NOTE — Progress Notes (Signed)
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 Pain drastically increased Monday at donor site.  Woke up with pain.  R side of head and R eye was hurting. Taking routine pain meds, percocet in AM, vicodin later in the day for pain but w/o relief.  Jeffrey Lin can feel a "bump" that feels different at the donor site.  Pain is some better now than on Monday.  No FCNAV.  No vision changes.  No focal neuro sx.    Pain 7/10 now, with pain meds.   PMH reviewed  Meds, vitals, and allergies reviewed.   ROS: See HPI.  Otherwise, noncontributory.  ncat except for postsurgical changes notes on scalp.  No fluctuant mass. At the area but the scalp is very sensitive.  No rash in the area perrl eomi Fundus wnl x2 Op and TM exam wnl Neck supple rrr ctab CN 2-12 wnl B, S/S/DTR wnl x4

## 2010-05-10 NOTE — Patient Instructions (Signed)
See [place] about your referral before your leave today.  

## 2010-05-11 ENCOUNTER — Encounter: Payer: Self-pay | Admitting: Family Medicine

## 2010-05-14 ENCOUNTER — Ambulatory Visit (INDEPENDENT_AMBULATORY_CARE_PROVIDER_SITE_OTHER): Payer: PRIVATE HEALTH INSURANCE | Admitting: Family Medicine

## 2010-05-14 ENCOUNTER — Encounter: Payer: Self-pay | Admitting: Family Medicine

## 2010-05-14 VITALS — BP 140/80 | HR 115 | Temp 97.6°F | Ht 78.0 in | Wt 213.8 lb

## 2010-05-14 DIAGNOSIS — R51 Headache: Secondary | ICD-10-CM

## 2010-05-14 MED ORDER — OXYCODONE-ACETAMINOPHEN 5-325 MG PO TABS: 1.0000 | ORAL_TABLET | Freq: Four times a day (QID) | ORAL | Status: DC | PRN

## 2010-05-14 MED ORDER — GABAPENTIN 100 MG PO CAPS: 100.0000 mg | ORAL_CAPSULE | Freq: Three times a day (TID) | ORAL | Status: DC

## 2010-05-14 NOTE — Patient Instructions (Signed)
Add gabapentin for pain...start 100 mg at bedtime and increase to 3 times a day as tolerated. Continue percocet for pain. We will call you once we have discussed the case with Dr. Theodoro Kalata at [facility].

## 2010-05-14 NOTE — Progress Notes (Signed)
    At last OV with Dr. 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 Pain drastically increased at donor site.  Woke up with pain.  R side of head and R eye was hurting. Taking routine pain meds, percocet in AM, vicodin later in the day for pain but w/o relief.  He can feel a "bump" that feels different at the donor site.  Pain is some better now than on Monday.  No FCNAV.  No vision changes.  No focal neuro sx.    Pain 7/10 now, with pain meds.   Meds were changed to Percocet every 6 hours. Vicodin was held.  Head CT showed: No significant intracranial abnormality.  Right frontal craniotomy flap does not show any significant abnormality.  Pt was previously seen by Dr. Georgeanna Harrison at pain center who transferred care back to [place] as pain was stable over 1 year ago. Surgeon.. Oral maxilofacial surg.Marland KitchenMarland KitchenDr Theodoro Kalata at [facility].  TODAY:  Pain continues to be just as severe as 1 week ago. Percocet helps for a few hours but then wears off. Very sensitive to touch. Having trouble functioning at work due to pain. Leaning forward.. When stands up has throbbing pain. No other numbness, no weakness in in ext. No fever. Still no focal neuro symptoms. No slurred speech, no vision changes, no gait issues.   Marland KitchenHPI  Review of Systems  Constitutional: Negative for fever and fatigue.  HENT: Negative for congestion.   Respiratory: Negative for shortness of breath.   Cardiovascular: Negative for chest pain, palpitations and leg swelling.  Skin: Negative for rash.  Neurological: Positive for headaches. Negative for dizziness, tremors, seizures, syncope, speech difficulty, weakness, light-headedness and numbness.  Psychiatric/Behavioral: Negative for dysphoric mood. The patient is not nervous/anxious.      Physical Exam  Constitutional: Vital signs are normal. He appears well-developed and well-nourished.  HENT:  Head: Normocephalic.  Right  Ear: Hearing normal.  Left Ear: Hearing normal.  Nose: Nose normal.  Mouth/Throat: Oropharynx is clear and moist and mucous membranes are normal.  Neck: Trachea normal. Carotid bruit is not present. No mass and no thyromegaly present.  Cardiovascular: Normal rate, regular rhythm and normal pulses.  Exam reveals no gallop, no distant heart sounds and no friction rub.   No murmur heard.      No peripheral edema  Pulmonary/Chest: Effort normal and breath sounds normal. No respiratory distress.  Neurological: He is alert. He has normal strength and normal reflexes. No cranial nerve deficit or sensory deficit. Coordination and gait normal.       Very sensitive to touch over right anterior scalp near scar, small area of swelling/firm not fluctuant.  No redness.   Skin: Skin is warm, dry and intact. No rash noted.  Psychiatric: He has a normal mood and affect. His speech is normal and behavior is normal. Thought content normal.

## 2010-05-14 NOTE — Assessment & Plan Note (Addendum)
Head CT showed no change at graft site.  No new neuro symptoms. Symptoms are very typical of scalp superficial nerve pain.. We will treat with trial of gabapentin. Use percocet prn. I will try to contact his surgeon if able for further recs.  May need referral back to pain center for ? nerve block or other treatment. Close follow up in 2 weeks.

## 2010-05-15 NOTE — Telephone Encounter (Signed)
Herbert Seta, has pt been advised of results?

## 2010-05-15 NOTE — Telephone Encounter (Signed)
Patient saw Dr. Para March this day not Dr. Ermalene Searing, but, he did follow up with [place] on Monday and yes he has been advised

## 2010-05-21 ENCOUNTER — Telehealth: Payer: Self-pay | Admitting: *Deleted

## 2010-05-21 MED ORDER — PREGABALIN 25 MG PO CAPS: 25.0000 mg | ORAL_CAPSULE | Freq: Every day | ORAL | Status: DC

## 2010-05-21 NOTE — Telephone Encounter (Signed)
Patient advised and rx sent in to pharmacy. 

## 2010-05-21 NOTE — Telephone Encounter (Signed)
Prior auth needed for lyrica, form is on your desk.

## 2010-05-21 NOTE — Telephone Encounter (Signed)
Pt was given gabapentin last week but he cant take this, it isnt working.  It's making him feel irritated and aggrivated.  He doesn't want to take this the way it makes him feel.

## 2010-05-21 NOTE — Telephone Encounter (Signed)
Stop gabapentin. We can try lyrica instead.  Will start this at very low dose at bedtime.. Will increase if tolerated, low dose will likely not be effective... Side effects usually go away if any after few days.  Have him increase to 2 tabs at bedtime if he is tolerating after 3-4 days. I called Dr. Theodoro Kalata again this AM but he is not in yet... I will call him again later today.

## 2010-05-22 ENCOUNTER — Telehealth: Payer: Self-pay | Admitting: *Deleted

## 2010-05-22 MED ORDER — HYDROCODONE-ACETAMINOPHEN 10-660 MG PO TABS: ORAL_TABLET | ORAL | Status: DC

## 2010-05-22 NOTE — Telephone Encounter (Signed)
Prior Berkley Harvey was given for lyrica, but I again spoke with the patient and he still doesn't want it.

## 2010-05-22 NOTE — Telephone Encounter (Signed)
Please try to get Dr. Theodoro Kalata on phone today...pull me put of room if he is available to talk.

## 2010-05-22 NOTE — Telephone Encounter (Signed)
Will call in vicodin..but I will still try to contact Dr. Theodoro Kalata to see if something else needs to be done. We may need to referral him back to pain management. Make sure he knows he should not take more that 4000 mg of acetominopen in 24 hours... this is in both percocet and vicodin.

## 2010-05-22 NOTE — Telephone Encounter (Signed)
Pt was prescribed lyrica yesterday but he read about the possible side effects, which he said are the same as gabapentin.  He doesn't want to try it now, he wants to go back to taking vicodin, he will need a new script called to [facility].

## 2010-05-25 ENCOUNTER — Encounter: Payer: Self-pay | Admitting: Family Medicine

## 2010-05-25 ENCOUNTER — Telehealth: Payer: Self-pay | Admitting: *Deleted

## 2010-05-25 ENCOUNTER — Ambulatory Visit (INDEPENDENT_AMBULATORY_CARE_PROVIDER_SITE_OTHER): Payer: PRIVATE HEALTH INSURANCE | Admitting: Family Medicine

## 2010-05-25 DIAGNOSIS — R51 Headache: Secondary | ICD-10-CM

## 2010-05-25 DIAGNOSIS — G8929 Other chronic pain: Secondary | ICD-10-CM

## 2010-05-25 MED ORDER — OXYCODONE HCL 5 MG PO TABS: 5.0000 mg | ORAL_TABLET | Freq: Every day | ORAL | Status: DC | PRN

## 2010-05-25 MED ORDER — OXYCODONE HCL 10 MG PO TB12: 10.0000 mg | ORAL_TABLET | Freq: Two times a day (BID) | ORAL | Status: AC

## 2010-05-25 NOTE — Telephone Encounter (Signed)
Patient has appt today will discuss then

## 2010-05-25 NOTE — Progress Notes (Signed)
At last OV with Dr. 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 Pain drastically increased at donor site.  Woke up with pain.  R side of head and R eye was hurting. Taking routine pain meds, percocet in AM, vicodin later in the day for pain but w/o relief.  He can feel a "bump" that feels different at the donor site.  Pain is some better now than on Monday.  No FCNAV.  No vision changes.  No focal neuro sx.    Meds were changed to Percocet every 6 hours. Vicodin was held.  Head CT showed: No significant intracranial abnormality.  Right frontal craniotomy flap does not show any significant abnormality.  Pt was previously seen by Dr. Georgeanna Harrison at pain center who transferred care back to [place] as pain was stable over 1 year ago. Surgeon.. Oral maxilofacial surg.Marland KitchenMarland KitchenDr Theodoro Kalata at [facility].  At my last OV with him 1 week ago:  Pain continues to be just as severe Percocet helps for a few hours but then wears off. Very sensitive to touch. Having trouble functioning at work due to pain. Leaning forward.. When stands up has throbbing pain. No other numbness, no weakness in in ext. No fever. Still no focal neuro symptoms. No slurred speech, no vision changes, no gait issues.  Tried gabapentin but caused him SE. He refused to try lyrica given fear of having similar SE as on gabapentin. We placed him back on vicodin for break through pain in percocet not helping. I have tried to get in touch with his Associate Professor.. Dr. Theodoro Kalata was to email me an answer to my questions for any further recs, but he has not done this yet. He did return my calls, but I was unavailable at the time.   Today he notes pain better control 5-6/10 ..he has been able to go back to previous pain med treatment. He has noted swelling has decreased. Using vicodin every 4-5 hours. Last took Percocet yesterday.  Per pt in past had nerve block x 2  in past..  occicput ..did not work.   Marland KitchenHPI   Review of Systems  Constitutional: Negative for fever and fatigue.  HENT: Negative for congestion.   Respiratory: Negative for shortness of breath.   Cardiovascular: Negative for chest pain, palpitations and leg swelling.  Skin: Negative for rash.  Neurological: Positive for headaches. Negative for dizziness, tremors, seizures, syncope, speech difficulty, weakness, light-headedness and numbness.  Psychiatric/Behavioral: Negative for dysphoric mood. The patient is not nervous/anxious.      Physical Exam  Constitutional: Vital signs are normal. He appears well-developed and well-nourished.  HENT:  Head: Normocephalic.  Right Ear: Hearing normal.  Left Ear: Hearing normal.  Nose: Nose normal.  Mouth/Throat: Oropharynx is clear and moist and mucous membranes are normal.  Neck: Trachea normal. Carotid bruit is not present. No mass and no thyromegaly present.  Cardiovascular: Normal rate, regular rhythm and normal pulses.  Exam reveals no gallop, no distant heart sounds and no friction rub.   No murmur heard.      No peripheral edema  Pulmonary/Chest: Effort normal and breath sounds normal. No respiratory distress.  Neurological: He is alert. He has normal strength and normal reflexes. No cranial nerve deficit or sensory deficit. Coordination and gait normal.       No longer sensitve over scalp... Back to baseline.   Skin: Skin is warm, dry and intact. No  rash noted.  Psychiatric: He has a normal mood and affect. His speech is normal and behavior is normal. Thought content normal.

## 2010-05-25 NOTE — Patient Instructions (Addendum)
Use oxycontin twice daily for pain control. Use vicodin only for breakthrough pain (try not to use).

## 2010-05-25 NOTE — Telephone Encounter (Signed)
Medication called in but, couldn't reach patient

## 2010-05-25 NOTE — Telephone Encounter (Signed)
Form faxed back to [employer], along with last office visit note.

## 2010-05-25 NOTE — Assessment & Plan Note (Signed)
Back to baseline level of pain. Tolerable.

## 2010-05-25 NOTE — Assessment & Plan Note (Signed)
To get better overall pain control and avoid tolerance building.. we will change to long acting pain meds. His is on border of overusing tylenol as well. Will change to long acting oxycontin BID use vicodin only for breakthrough pain.

## 2010-05-25 NOTE — Telephone Encounter (Signed)
Prior Berkley Harvey is needed for oxycontin, form is on your desk.

## 2010-05-28 ENCOUNTER — Emergency Department (HOSPITAL_COMMUNITY): Payer: No Typology Code available for payment source

## 2010-05-28 ENCOUNTER — Ambulatory Visit: Payer: Self-pay | Admitting: Family Medicine

## 2010-05-28 ENCOUNTER — Inpatient Hospital Stay (HOSPITAL_COMMUNITY)
Admission: EM | Admit: 2010-05-28 | Discharge: 2010-05-30 | DRG: 494 | Disposition: A | Discharge status: Home Health | Payer: No Typology Code available for payment source | Attending: Orthopedic Surgery | Admitting: Orthopedic Surgery

## 2010-05-28 DIAGNOSIS — S61409A Unspecified open wound of unspecified hand, initial encounter: Secondary | ICD-10-CM | POA: Diagnosis present

## 2010-05-28 DIAGNOSIS — S82143A Displaced bicondylar fracture of unspecified tibia, initial encounter for closed fracture: Secondary | ICD-10-CM

## 2010-05-28 DIAGNOSIS — Z1881 Retained glass fragments: Secondary | ICD-10-CM

## 2010-05-28 DIAGNOSIS — Y92410 Unspecified street and highway as the place of occurrence of the external cause: Secondary | ICD-10-CM

## 2010-05-28 DIAGNOSIS — S82109A Unspecified fracture of upper end of unspecified tibia, initial encounter for closed fracture: Principal | ICD-10-CM | POA: Diagnosis present

## 2010-05-28 DIAGNOSIS — S81009A Unspecified open wound, unspecified knee, initial encounter: Secondary | ICD-10-CM | POA: Diagnosis present

## 2010-05-28 LAB — BASIC METABOLIC PANEL
Chloride: 99 mEq/L (ref 96–112)
GFR calc Af Amer: >60 mL/min (ref >60)
Potassium: 4.2 mEq/L (ref 3.5–5.1)
Sodium: 135 mEq/L (ref 135–145)

## 2010-05-28 LAB — DIFFERENTIAL
Eosinophils Absolute: 0 10*3/uL (ref 0.0–0.7)
Lymphs Abs: 2.6 10*3/uL (ref 0.7–4.0)
Neutrophils Relative %: 82 % — ABNORMAL HIGH (ref 43–77)

## 2010-05-28 LAB — TYPE AND SCREEN
ABO/RH(D): O POS
Antibody Screen: NEGATIVE

## 2010-05-28 LAB — CBC
MCV: 92.8 fL (ref 78.0–100.0)
Platelets: 230 10*3/uL (ref 150–400)
RBC: 4.47 MIL/uL (ref 4.22–5.81)
WBC: 17.6 10*3/uL — ABNORMAL HIGH (ref 4.0–10.5)

## 2010-05-28 LAB — ABO/RH: ABO/RH(D): O POS

## 2010-05-29 LAB — CBC
Platelets: 196 10*3/uL (ref 150–400)
RBC: 3.88 MIL/uL — ABNORMAL LOW (ref 4.22–5.81)
RDW: 12.6 % (ref 11.5–15.5)
WBC: 12.2 10*3/uL — ABNORMAL HIGH (ref 4.0–10.5)

## 2010-05-29 LAB — BASIC METABOLIC PANEL
Calcium: 8.9 mg/dL (ref 8.4–10.5)
Creatinine, Ser: 0.9 mg/dL (ref 0.4–1.5)
GFR calc Af Amer: >60 mL/min (ref >60)

## 2010-05-30 ENCOUNTER — Telehealth: Payer: Self-pay | Admitting: Family Medicine

## 2010-05-30 NOTE — Telephone Encounter (Signed)
Pharmacy called.  Patient pulled on [facility] registry.  05/23/2010, filled #100 Hydrocodone 10 mg / 660 mg APAP Today spoke to pharmacist and is going to fill Oxycontin last written by [facility] and pay cash.  Has a tibial plateau fx fixed by [facility] by Dr. Lajoyce Corners, and tried to fill #60 Percocet at pharmacy today. Given abundance of narcotics available now, advised the pharmacy not to fill this percocet prescription.  Forward to Dr. Ermalene Searing.

## 2010-05-31 NOTE — Telephone Encounter (Signed)
Yes, I agree. Given plan  pt to change to oxycontin..he should no longer be using Percocet. Agree with not filling prescription of percocet. Insurance will not cover oxycontin so we will likely need to go back to his previous regimen once oxycontin is gone unless he plans on paying out of pocket. Please let pt know what is going on.

## 2010-06-01 NOTE — Telephone Encounter (Signed)
Patient advised.
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
## 2010-06-05 NOTE — Discharge Summary (Signed)
  NAMEREYCE, LUBECK               ACCOUNT NO.:  [id number]  MEDICAL RECORD NO.:  [id number]           PATIENT TYPE:  I  LOCATION:  5001                         FACILITY:  [facility]  PHYSICIAN:  Nadara Mustard, MD     DATE OF BIRTH:  [birth year]  DATE OF ADMISSION:  05/28/2010 DATE OF DISCHARGE:  05/30/2010                              DISCHARGE SUMMARY   FINAL DIAGNOSES: 1. Open left knee. 2. Complex laceration, left knee, left index finger. 3. Tibial plateau fracture, left knee.  SURGICAL PROCEDURES: 1. Repair of complex laceration, left knee and left index finger. 2. Irrigation and debridement, open left knee. 3. Open reduction and internal fixation, left tibial plateau fracture.  Discharged to home in stable condition, nonweightbearing on the left. Genevieve Norlander for home health physical therapy.  Prescription for rolling walker, crutches, and wheelchair with elevated leg rests.  Medications were Percocet and a nicotine patch.  Strictly nonsmoking.  Followup in the office in 1 week.  Discharged to home in stable condition.     Nadara Mustard, MD     MVD/MEDQ  D:  05/30/2010  T:  05/30/2010  Job:  [id number]  Electronically Signed by Aldean Baker MD on 06/05/2010 10:14:57 AM

## 2010-06-07 ENCOUNTER — Telehealth: Payer: Self-pay | Admitting: *Deleted
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
## 2010-06-08 NOTE — Assessment & Plan Note (Signed)
[facility]                           [facility] OFFICE NOTE   Jeffrey Lin, Jeffrey Lin                        MRN:          [id number]  DATE:04/23/2006                            DOB:          [birth year]    NEW PATIENT HISTORY AND PHYSICAL   CHIEF COMPLAINT:  A 35 year old white male here to establish new doctor.   HISTORY OF PRESENT ILLNESS:  Jeffrey Lin reports that he has been  treated for chronic pain and anxiety by his previous doctor, Dr. Bethena Midget.  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 Since  that time, he has had several bone grafts to his jaw. He has had  problems with chronic pain. He had been controlled on alternating  between oxycodone and hydrocodone. He had also been controlled on Xanax  b.i.d. to t.i.d. for his anxiety. He states that Dr. Bethena Midget scheduled him  to be seen at a chronic pain center. He was seen by Dr. Vear Clock  approximately two times. He was tried on Opana, which did not help his  symptoms. He states that he was discharged from both clinics secondary  to trying to fill Xanax from his first doctor even though he did not  realize that this was a controlled medication and that this would result  in him being discharged from the practices.   He states that he has been off of oxycodone and hydrocodone since the  past weekend. He has been off of Xanax for several weeks. He states that  he had some nervousness but denies specific panic attacks. He has tried  Paxil and Cymbalta in the past for anxiety. He first stated that they  helped some with his depression, but did report that they did not seem  to help with chronic pain.   REVIEW OF SYSTEMS:  Chronic headaches. No dizziness. No syncope. No  chest pain. No shortness of breath. No palpitations. No nausea,  vomiting, diarrhea or constipation.   PAST MEDICAL HISTORY:  1. Chronic pain.  2. Depression.  3. Anxiety.  4. Tobacco abuse.   HOSPITALIZATIONS, SURGERIES, AND PROCEDURES:  1. In 2004, ameloblastoma of lower jaw, removed.  2. In 2004, bone graft from hip to jaw.  3. In 2006, bone graft from right side of head to jaw.   ALLERGIES:  None.   MEDICATIONS:  1. Xanax 1 mg p.o. t.i.d. anxiety.  2. Hydrocodone 1 tablet p.o. q6 hours p.r.n. pain.  3. Oxycodone 1 tablet p.o. q6 hours p.r.n. pain.   SOCIAL HISTORY:  He smokes two packs per day and has done so for about  ten years. He drinks alcohol occasionally, about one beer every three  months. He denies any past drug use. 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 He is currently single, but is in a relationship and  is sexually active, but uses protection with condoms. He does not get  any exercise because he states that running causes his head and jaw to  hurt. As far as his diet, he states that he eats fast  food about once a  day, but does get fruits and vegetables as well as water. He drinks a  lot of soda.   FAMILY HISTORY:  Father is alive at age 36 and healthy. Mother is alive  at age 56 with hypercholesterolemia and hypertension. No family history  of heart attack before age 9. He has four brothers who are healthy.  There is no family history of diabetes or any type of cancer.   PHYSICAL EXAMINATION:  VITAL SIGNS: Height is 76 inches. Weight is  191.4, blood pressure is 126/80, pulse 100. Temperature 98.3.  GENERAL: Healthy-appearing male with some facial scarring and jaw  malformation in no apparent distress.  HEENT: PERRLA. Extra-ocular muscles intact. Oropharynx clear. No teeth  as well as mandible in lower jaw. Nares clear. No thyromegaly. No  lymphadenopathy.  CARDIOVASCULAR: Regular rate and rhythm. No murmurs, rubs or gallops.  PULMONARY: Clear to auscultation bilaterally. No wheezes, rales or  rhonchi.  ABDOMEN: Soft and nontender. Normoactive bowel sounds. No  hepatosplenomegaly.  MUSCULOSKELETAL: Strength 5/5 in upper and lower  extremities.  NEURO: No tremor. Cranial nerves II-XII grossly intact. Reflexes 2+,  sensation intact.   ASSESSMENT/PLAN:  1. Chronic pain: I stated to the patient that because I am seeing him      for the first that I will not give him prescriptions for any pain      medication without at least initially reviewing records. I will go      ahead and set him up at another chronic pain center where he can      make another chronic pain agreement. At this point in time, he does      not seem to be going through evidence of withdrawal. His heart rate      on recheck was not tachycardic. He was instructed if he does feel      ill, tremulous or nervous to go to the emergency room or if his      pain was poorly controlled.  2. Anxiety, chronic: We discussed that Xanax is not the best longterm      treatment for generalized anxiety. He has been off of this for      several weeks so I do not think he will go through withdrawal. I      will initiate him on sertraline 50 mg daily to see if this will      help with his symptoms until I can obtain records from his previous      doctor.  3. Prevention: At this point in time, it is unclear as to whether he      is up-to-date with prevention or whether he has had a cholesterol      panel in the past. I will obtain records from his previous doctor      and we can consider discussing these issues further at the next      appointment.    Jeffrey Nora, MD  Electronically Signed   AB/MedQ  DD: 04/23/2006  DT: 04/23/2006  Job #: [id number]

## 2010-06-11 ENCOUNTER — Telehealth: Payer: Self-pay | Admitting: *Deleted

## 2010-06-11 MED ORDER — OXYCODONE-ACETAMINOPHEN 5-325 MG PO TABS: 1.0000 | ORAL_TABLET | Freq: Three times a day (TID) | ORAL | Status: AC | PRN

## 2010-06-11 NOTE — Telephone Encounter (Signed)
vicodin 05-23-2010  100 tab  Oxycodone 05-25-2010 60 tab

## 2010-06-11 NOTE — Telephone Encounter (Signed)
Triage Record Num: [id number] Operator: Elita Boone Patient Name: Jeffrey Lin Call Date & Time: 06/08/2010 7:30:37PM Patient Phone: [phone] PCP: Kerby Nora Patient Gender: Male PCP Fax : [phone] Patient DOB: October 14, 1975 Practice Name: [facility] Reason for Call: Pt is calling to ask for more pain medication for pain in leg. Pt instructed to call office for an appt to evaluate pain. Protocol(s) Used: Medication Question Calls, No Triage (Adults) Recommended Outcome per Protocol: Call Provider within 24 Hours Reason for Outcome: Caller requesting a non urgent new prescription or refill and triager unable to refill per unit policy Care Advice: ~ 05/

## 2010-06-11 NOTE — Telephone Encounter (Signed)
Heather:  I am having trouble telling from the EMR. Can you tell or call pharm. When was his last refill of Percocet and vicodin. I know when the oxycodone we cancelled was.

## 2010-06-11 NOTE — Telephone Encounter (Signed)
See previous phone note. Please let me know when last refills were ASAP. Let me know when back on my desk top.

## 2010-06-11 NOTE — Telephone Encounter (Signed)
Sent to you

## 2010-06-11 NOTE — Telephone Encounter (Signed)
Patient advised that rx ready for pick up

## 2010-06-19 ENCOUNTER — Telehealth: Payer: Self-pay | Admitting: *Deleted

## 2010-06-19 ENCOUNTER — Other Ambulatory Visit: Payer: Self-pay | Admitting: *Deleted

## 2010-06-19 MED ORDER — HYDROCODONE-ACETAMINOPHEN 10-650 MG PO TABS: 1.0000 | ORAL_TABLET | Freq: Four times a day (QID) | ORAL | Status: DC | PRN

## 2010-06-19 NOTE — Telephone Encounter (Signed)
rx called to pharmacy 

## 2010-06-19 NOTE — Telephone Encounter (Signed)
Let pharmacixt know we are following closely.  Oxycontin should not have been filled.. I asked for it to be cancelled.  He will not have further refills until next month.
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
## 2010-06-19 NOTE — Telephone Encounter (Signed)
Spoke with pt in detail. He states he was told by my staff to fill oxycontin. He states he forgot he was supposed to go to one pharmacy only.  Refill denied. Pt notified.  I have told pt I will look on [place] controlled substance registry to verify what and where meds have been filled. We will verify that we have a signed copy of the office pain contract. I will contact him about his further status as a patient at our clinic in the next few days.

## 2010-06-19 NOTE — Telephone Encounter (Signed)
After speaking to pharmacy [facility] and [facility]et was filled at another pharmacy

## 2010-06-19 NOTE — Telephone Encounter (Signed)
[facility] wanted you to be aware that pt was given 100 vicodin on 5/2, 60 oxycontin on 5/9, 120 percocet since 5/9 and 120 vicodin today.  When he went to get the [facility]ocet filled [facility] because he had just gotten oxycontin filled and he told the pharmacist that he was already out.  Pharmist thinks that pt is an "overdose waiting to happen".

## 2010-06-19 NOTE — Telephone Encounter (Signed)
Last spoke with pharmacist

## 2010-06-21 ENCOUNTER — Telehealth: Payer: Self-pay | Admitting: Family Medicine

## 2010-06-21 ENCOUNTER — Ambulatory Visit (HOSPITAL_COMMUNITY)
Admission: RE | Admit: 2010-06-21 | Discharge: 2010-06-21 | Disposition: A | Discharge status: Home / Self Care | Payer: PRIVATE HEALTH INSURANCE | Source: Ambulatory Visit | Attending: Orthopedic Surgery | Admitting: Orthopedic Surgery

## 2010-06-21 DIAGNOSIS — M79609 Pain in unspecified limb: Secondary | ICD-10-CM

## 2010-06-21 DIAGNOSIS — M7989 Other specified soft tissue disorders: Secondary | ICD-10-CM | POA: Insufficient documentation

## 2010-06-21 DIAGNOSIS — I82890 Acute embolism and thrombosis of other specified veins: Secondary | ICD-10-CM | POA: Insufficient documentation

## 2010-06-21 DIAGNOSIS — I80299 Phlebitis and thrombophlebitis of other deep vessels of unspecified lower extremity: Secondary | ICD-10-CM

## 2010-06-21 NOTE — Telephone Encounter (Signed)
Encounter opened in error

## 2010-06-22 ENCOUNTER — Encounter: Payer: Self-pay | Admitting: Family Medicine

## 2010-06-22 ENCOUNTER — Ambulatory Visit (INDEPENDENT_AMBULATORY_CARE_PROVIDER_SITE_OTHER): Payer: No Typology Code available for payment source | Admitting: Family Medicine

## 2010-06-22 VITALS — BP 110/80 | HR 86 | Temp 97.9°F | Ht 78.0 in | Wt 198.8 lb

## 2010-06-22 DIAGNOSIS — G8929 Other chronic pain: Secondary | ICD-10-CM

## 2010-06-22 MED ORDER — OXYCODONE-ACETAMINOPHEN 5-325 MG PO TABS: 1.0000 | ORAL_TABLET | ORAL | Status: DC | PRN

## 2010-06-22 NOTE — Assessment & Plan Note (Signed)
Reviewed pain contract in detail with pt. We have not discharged him from the practice because of the extenuating circumstances noted. We have wraned him if the pain contract is broken again we will discharge him from the clinic.  I tore up prescription from Dr. Lajoyce Corners and wrote it again in my name. We have called Dr. Lajoyce Corners and will take over pain medication control for this pt as he has a pain contract  with [place].  Close follow up of pain control in 2 weeks.

## 2010-06-22 NOTE — Telephone Encounter (Signed)
I have reviewed narcotic registry. Appears pt filled oxycontin at his usual pharmacy and one refill at different pharmacy written by Dr. Lajoyce Corners in ER.  Pt has been asked to come to clinic today to discuss pain contract and possibility of continuing to fill pain medications for him.

## 2010-06-22 NOTE — Patient Instructions (Signed)
Review your copy of pain contract, please follow exactly.
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
## 2010-06-26 ENCOUNTER — Ambulatory Visit: Payer: PRIVATE HEALTH INSURANCE | Admitting: Family Medicine

## 2010-06-29 ENCOUNTER — Telehealth: Payer: Self-pay | Admitting: *Deleted

## 2010-06-29 MED ORDER — DICLOFENAC SODIUM 75 MG PO TBEC: 75.0000 mg | DELAYED_RELEASE_TABLET | Freq: Two times a day (BID) | ORAL | Status: DC

## 2010-06-29 NOTE — Telephone Encounter (Signed)
We can change to vicodin once due for next prescription.

## 2010-06-29 NOTE — Telephone Encounter (Signed)
Patient says that the percocet is not working for him and is asking if he could go back to the Vicodin. [facility].

## 2010-06-29 NOTE — Telephone Encounter (Signed)
Patient would like to try an anti inflamatory

## 2010-07-02 ENCOUNTER — Other Ambulatory Visit: Payer: Self-pay | Admitting: *Deleted

## 2010-07-02 ENCOUNTER — Telehealth: Payer: Self-pay | Admitting: *Deleted

## 2010-07-02 MED ORDER — HYDROCODONE-ACETAMINOPHEN 7.5-500 MG PO TABS: 1.0000 | ORAL_TABLET | Freq: Four times a day (QID) | ORAL | Status: DC | PRN

## 2010-07-02 NOTE — Telephone Encounter (Signed)
Patient has already used all the percocet and says that he is hurting so bad right know he cant stand

## 2010-07-02 NOTE — Telephone Encounter (Signed)
Patient says that the anti inflammatory is not helping. He is asking what he should do until he can go back to the vicodin. Please advise.

## 2010-07-02 NOTE — Telephone Encounter (Signed)
He was given #60 on 6/1... To take every four hours. Should be out 6/11. He is taking every four hours regularly.  Will refill for him to use every 6 hours for pain.. Will given enough to last to appt 6/15

## 2010-07-02 NOTE — Telephone Encounter (Signed)
Have him bring in the remainder of the percocet he has not used and we will discrd.  Call pharmacy to let them know that patient has done this.  Will send in prescription for vicodin at that point.

## 2010-07-02 NOTE — Telephone Encounter (Signed)
Patient advised and rx sent to pharmacy  

## 2010-07-06 ENCOUNTER — Encounter: Payer: Self-pay | Admitting: Family Medicine

## 2010-07-06 ENCOUNTER — Ambulatory Visit (INDEPENDENT_AMBULATORY_CARE_PROVIDER_SITE_OTHER): Payer: BC Managed Care – PPO | Admitting: Family Medicine

## 2010-07-06 VITALS — BP 130/80 | HR 95 | Temp 98.1°F | Ht 78.0 in | Wt 198.8 lb

## 2010-07-06 DIAGNOSIS — F411 Generalized anxiety disorder: Secondary | ICD-10-CM

## 2010-07-06 DIAGNOSIS — Z86718 Personal history of other venous thrombosis and embolism: Secondary | ICD-10-CM | POA: Insufficient documentation

## 2010-07-06 DIAGNOSIS — Z8781 Personal history of (healed) traumatic fracture: Secondary | ICD-10-CM

## 2010-07-06 DIAGNOSIS — I82409 Acute embolism and thrombosis of unspecified deep veins of unspecified lower extremity: Secondary | ICD-10-CM

## 2010-07-06 DIAGNOSIS — G8929 Other chronic pain: Secondary | ICD-10-CM

## 2010-07-06 MED ORDER — HYDROCODONE-ACETAMINOPHEN 10-650 MG PO TABS: ORAL_TABLET | ORAL | Status: DC

## 2010-07-06 NOTE — Patient Instructions (Signed)
Get exercise as tolerated.  Limit narcotic use as much as able.
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
## 2010-07-09 NOTE — Assessment & Plan Note (Signed)
Stable per Dr. Kaur. 
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
## 2010-07-09 NOTE — Assessment & Plan Note (Signed)
Discussed pain issues in detail. Refilled vicodin at previous dose for 1 month. Follow up at that point.

## 2010-08-03 ENCOUNTER — Ambulatory Visit (INDEPENDENT_AMBULATORY_CARE_PROVIDER_SITE_OTHER): Payer: BC Managed Care – PPO | Admitting: Family Medicine

## 2010-08-03 ENCOUNTER — Encounter: Payer: Self-pay | Admitting: Family Medicine

## 2010-08-03 DIAGNOSIS — G8929 Other chronic pain: Secondary | ICD-10-CM

## 2010-08-03 DIAGNOSIS — F329 Major depressive disorder, single episode, unspecified: Secondary | ICD-10-CM

## 2010-08-03 DIAGNOSIS — I82409 Acute embolism and thrombosis of unspecified deep veins of unspecified lower extremity: Secondary | ICD-10-CM

## 2010-08-03 DIAGNOSIS — R7309 Other abnormal glucose: Secondary | ICD-10-CM

## 2010-08-03 DIAGNOSIS — E7800 Pure hypercholesterolemia, unspecified: Secondary | ICD-10-CM

## 2010-08-03 DIAGNOSIS — Z8781 Personal history of (healed) traumatic fracture: Secondary | ICD-10-CM

## 2010-08-03 MED ORDER — HYDROCODONE-ACETAMINOPHEN 10-650 MG PO TABS: ORAL_TABLET | ORAL | Status: DC
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
## 2010-08-03 NOTE — Assessment & Plan Note (Signed)
Stable per Dr. Evelene Croon.

## 2010-08-03 NOTE — Assessment & Plan Note (Signed)
Due for reeval at [facility].

## 2010-08-03 NOTE — Assessment & Plan Note (Addendum)
On coumadin short term as no risk factors  Except smoker and clot occurred post surgery.

## 2010-08-03 NOTE — Assessment & Plan Note (Signed)
Stable control on current dose. Encourage pt to space medication as able.

## 2010-08-03 NOTE — Patient Instructions (Addendum)
Elevate left leg above heart when sitting.  As able work on increasing exercise.  QUit smoking!

## 2010-08-03 NOTE — Assessment & Plan Note (Signed)
Due for reeval at [facility].  

## 2010-08-30 ENCOUNTER — Other Ambulatory Visit: Payer: Self-pay | Admitting: *Deleted

## 2010-08-30 MED ORDER — HYDROCODONE-ACETAMINOPHEN 10-650 MG PO TABS: ORAL_TABLET | ORAL | Status: DC

## 2010-08-30 NOTE — Telephone Encounter (Signed)
rx called to [facility] in [place]

## 2010-08-30 NOTE — Telephone Encounter (Signed)
Ok to call in #120. 0 refills  Dr. B out of town.

## 2010-08-30 NOTE — Telephone Encounter (Signed)
Patient is leaving to go out of town tomorrow and will not be returning until later next week. He is asking that this be called in today.

## 2010-09-10 ENCOUNTER — Telehealth: Payer: Self-pay | Admitting: *Deleted

## 2010-09-10 MED ORDER — DICLOFENAC SODIUM 1 % TD GEL: 1.0000 "application " | Freq: Four times a day (QID) | TRANSDERMAL | Status: DC

## 2010-09-10 NOTE — Telephone Encounter (Signed)
Will send in  topical antiinflammatory for headache given it seems to be superficial headache, but given headache is recurring...  would recommend referral to back to Dr. Theodoro Kalata ASAP.Jeffrey Lin Pt needs to call and schedule.

## 2010-09-10 NOTE — Telephone Encounter (Signed)
Called and spoke with pt and he is aware.  Pt will schedule an appt with Dr. Theodoro Kalata.

## 2010-09-10 NOTE — Telephone Encounter (Signed)
C/o headache since Friday, current med (hydrocodone) is not helping he request another rx called in. He says he he had a bone graft on his head and you are aware of it and his pain issues. He wanted appt to see you and discuss meds but there were none avail. Can rx be called in for breakthrough pain?

## 2010-09-12 ENCOUNTER — Telehealth: Payer: Self-pay | Admitting: *Deleted

## 2010-09-12 NOTE — Telephone Encounter (Signed)
Prior auth is needed for voltaren gel, form is on your desk. 

## 2010-09-13 ENCOUNTER — Telehealth: Payer: Self-pay | Admitting: *Deleted

## 2010-09-13 ENCOUNTER — Ambulatory Visit (INDEPENDENT_AMBULATORY_CARE_PROVIDER_SITE_OTHER): Payer: BC Managed Care – PPO | Admitting: Family Medicine

## 2010-09-13 ENCOUNTER — Encounter: Payer: Self-pay | Admitting: Family Medicine

## 2010-09-13 DIAGNOSIS — M79604 Pain in right leg: Secondary | ICD-10-CM | POA: Insufficient documentation

## 2010-09-13 DIAGNOSIS — F17200 Nicotine dependence, unspecified, uncomplicated: Secondary | ICD-10-CM

## 2010-09-13 DIAGNOSIS — G8929 Other chronic pain: Secondary | ICD-10-CM

## 2010-09-13 DIAGNOSIS — M79609 Pain in unspecified limb: Secondary | ICD-10-CM

## 2010-09-13 DIAGNOSIS — M79605 Pain in left leg: Secondary | ICD-10-CM | POA: Insufficient documentation

## 2010-09-13 DIAGNOSIS — Z8781 Personal history of (healed) traumatic fracture: Secondary | ICD-10-CM

## 2010-09-13 MED ORDER — HYDROCODONE-ACETAMINOPHEN 10-650 MG PO TABS: ORAL_TABLET | ORAL | Status: DC

## 2010-09-13 MED ORDER — VARENICLINE TARTRATE 0.5 MG X 11 & 1 MG X 42 PO MISC: ORAL | Status: DC

## 2010-09-13 MED ORDER — VARENICLINE TARTRATE 1 MG PO TABS: 1.0000 mg | ORAL_TABLET | Freq: Two times a day (BID) | ORAL | Status: DC
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
## 2010-09-13 NOTE — Assessment & Plan Note (Addendum)
Given multiple fluctuations of chroninc pain in last 6 months, not well controlle don previous regimen. I will refer him back to see Dr. Idalia Needle. He feels back and hip pain are from graft site as opposed to new different back pain or radiculopathy... And NSAIDS have not been helpful in the past. I will refill pt for hydrocodone to take 6 a day in the next month..he will need be in process of seeing Pain Center by then.

## 2010-09-13 NOTE — Telephone Encounter (Signed)
Pt just wanted to let you know that he is not going to be able to get the chantix because it's too expensive, even with insurance.

## 2010-09-13 NOTE — Patient Instructions (Signed)
Stop at front desk to work with Shirlee Limerick to get back to see Dr. Georgeanna [facility] at [facility].   Work on smoking cessation. Start Chantix.

## 2010-09-13 NOTE — Progress Notes (Signed)
  Subjective:    Patient ID: Jeffrey Lin, male    DOB: May 19, 1975, 35 y.o.   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 Since  that time, he has had several bone grafts to his jaw 2006. He has had  problems with chronic pain in graft sites, right hip and right lateral head. He had been controlled on hydrocodone previously.  Today he presents for a second time in the last 6 months with request for pain medicaiton changes.... In last 3-4 weeks pain in left knee where he had past patellar injury/DVT and in right hip at site of previoius bone graft donation.  No recent fall, no injury.  He had called in recently with an increase of head  Pain... But this has improved some in the meantime.   He reports that the pain in 9/10 on pain scale. He is having trouble sleeping at night.  Has no sick time at work so he is having to push through the pain to continue work. Pain worse in AMs.  Ortho has told him to elevate leg. He has been released from Ortho despite continued pain. Off blood thinner for DVT... On ASA 81 x 1 year.  He is taking 6 hydrocodone a day (about ever 4 hours). Last refill of 10/650 mg on 08/30/2010, # 120 If he takes this much... He does fairly well.  He is out of the med today.  He has restarted smoking. Working on quitting smoking.Marland Kitchen wellbutrin had bad taste in mouth SE. Insurance has changed recently.. Interested in trying Chantix if covered by new insurance.     Review of Systems  Constitutional: Negative for fever and fatigue.  HENT: Negative for ear pain.   Eyes: Negative for pain.  Respiratory: Negative for shortness of breath.   Cardiovascular: Negative for chest pain, palpitations and leg swelling.  Gastrointestinal: Negative for diarrhea, constipation, blood in stool and abdominal distention.  Musculoskeletal: Positive for back pain and gait problem. Negative for myalgias and joint swelling.    Neurological: Positive for weakness. Negative for numbness.       Objective:   Physical Exam  Constitutional: Vital signs are normal. He appears well-developed and well-nourished.  HENT:  Head: Normocephalic.  Right Ear: Hearing normal.  Left Ear: Hearing normal.  Nose: Nose normal.  Mouth/Throat: Oropharynx is clear and moist and mucous membranes are normal.  Neck: Trachea normal. Carotid bruit is not present. No mass and no thyromegaly present.  Cardiovascular: Normal rate, regular rhythm and normal pulses.  Exam reveals no gallop, no distant heart sounds and no friction rub.   No murmur heard.      No peripheral edema  Pulmonary/Chest: Effort normal and breath sounds normal. No respiratory distress.  Musculoskeletal:       Pain in left lateral back and hip near graft site. ? Positive SLR. Cannot perform Faber's due to pain. Diffuse ttp in left knee and left lower leg. No joint swelling.  Skin: Skin is warm, dry and intact. No rash noted.  Psychiatric: He has a normal mood and affect. His speech is normal and behavior is normal. Thought content normal.          Assessment & Plan:

## 2010-09-13 NOTE — Assessment & Plan Note (Signed)
The patient was counseled on the dangers of tobacco use, and was advised to quit.  Reviewed strategies to maximize success, including removing cigarettes and smoking materials from environment, stress management, substitution of other forms of reinforcement, support of family/friends and written materials  . He was started on Chantix.

## 2010-09-14 ENCOUNTER — Telehealth: Payer: Self-pay | Admitting: Family Medicine

## 2010-09-14 NOTE — Telephone Encounter (Signed)
He has been told to not exceed 6 tabs a day and 4000 mg tylenol daily. Please let pharmacist know he has been referred back to pain center for poorly controlled pain so that they know we are aware of the issue.

## 2010-09-14 NOTE — Telephone Encounter (Signed)
Called patient and phone disconnected also called patient at work and he will not be back until Monday

## 2010-09-14 NOTE — Telephone Encounter (Signed)
Also call pt ... Let him know we have talked to the pharmacist. If he takes more than 6 tabs a day it is a violation of pain contract and he will be discharged from our practice. 6 tabs a day is 3900 mg daily of tylenol, the max.

## 2010-09-14 NOTE — Telephone Encounter (Signed)
Pharmacist called to let you know that pt told him that he takes 8-10 vicodin a day, your note from 8/23 says that he can take 6 a day.  Pharmacist warned him about liver damage, said pt just got 120 filled on 08/30/10.

## 2010-09-17 NOTE — Telephone Encounter (Signed)
Patient and pharmacy advised

## 2010-09-17 NOTE — Telephone Encounter (Signed)
Prior auth for voltaren gel has been denied.  Denial letter is on your desk.

## 2010-09-19 ENCOUNTER — Telehealth: Payer: Self-pay | Admitting: Family Medicine

## 2010-09-19 NOTE — Telephone Encounter (Signed)
Dr Ermalene Searing, You received a note declining to see this patient back at their [facility] . The note is is your in box.

## 2010-09-20 NOTE — Telephone Encounter (Signed)
[place]... Can we try different pain center please?

## 2010-09-25 NOTE — Telephone Encounter (Signed)
Jeffrey Lin called the patient to let him know that Jeffrey Lin would not see him back in their office. New referral to Jeffrey Lin was faxed today. We are waiting from them to hear back about an appt. Jeffrey Lin

## 2010-09-28 ENCOUNTER — Telehealth: Payer: Self-pay | Admitting: Family Medicine

## 2010-09-28 ENCOUNTER — Other Ambulatory Visit (INDEPENDENT_AMBULATORY_CARE_PROVIDER_SITE_OTHER): Payer: BC Managed Care – PPO

## 2010-09-28 DIAGNOSIS — R7309 Other abnormal glucose: Secondary | ICD-10-CM

## 2010-09-28 DIAGNOSIS — E7800 Pure hypercholesterolemia, unspecified: Secondary | ICD-10-CM

## 2010-09-28 LAB — COMPREHENSIVE METABOLIC PANEL
ALT: 12 U/L (ref 0–53)
Albumin: 4.3 g/dL (ref 3.5–5.2)
CO2: 31 mEq/L (ref 19–32)
Chloride: 103 mEq/L (ref 96–112)
GFR: 115.71 mL/min (ref >60.00)
Glucose, Bld: 112 mg/dL — ABNORMAL HIGH (ref 70–99)
Potassium: 4.4 mEq/L (ref 3.5–5.1)
Sodium: 141 mEq/L (ref 135–145)
Total Bilirubin: 0.6 mg/dL (ref 0.3–1.2)
Total Protein: 7.3 g/dL (ref 6.0–8.3)

## 2010-09-28 LAB — LIPID PANEL
Cholesterol: 156 mg/dL (ref 0–200)
LDL Cholesterol: 102 mg/dL — ABNORMAL HIGH (ref 0–99)
VLDL: 20.4 mg/dL (ref 0.0–40.0)

## 2010-09-28 NOTE — Telephone Encounter (Signed)
Message copied by Excell Seltzer on Fri Sep 28, 2010  8:20 AM ------      Message from: Alvina Chou      Created: Fri Sep 21, 2010  4:15 PM       Patient is scheduled for [facility] labs,Friday,  please order future labs, Thanks , Camelia Eng

## 2010-10-05 ENCOUNTER — Ambulatory Visit (INDEPENDENT_AMBULATORY_CARE_PROVIDER_SITE_OTHER): Payer: BC Managed Care – PPO | Admitting: Family Medicine

## 2010-10-05 ENCOUNTER — Encounter: Payer: Self-pay | Admitting: Family Medicine

## 2010-10-05 DIAGNOSIS — R7309 Other abnormal glucose: Secondary | ICD-10-CM

## 2010-10-05 DIAGNOSIS — E7800 Pure hypercholesterolemia, unspecified: Secondary | ICD-10-CM

## 2010-10-05 DIAGNOSIS — Z0000 Encounter for general adult medical examination without abnormal findings: Secondary | ICD-10-CM

## 2010-10-05 DIAGNOSIS — F411 Generalized anxiety disorder: Secondary | ICD-10-CM

## 2010-10-05 NOTE — Assessment & Plan Note (Signed)
Discussed stopping soda. Issue likely resolve if this lifestyle change made.

## 2010-10-05 NOTE — Assessment & Plan Note (Signed)
Improved control. 

## 2010-10-05 NOTE — Assessment & Plan Note (Signed)
Stable

## 2010-10-05 NOTE — Patient Instructions (Addendum)
Work on regular exercise as able. Decrease carbohydrates. Stop all [facility]!! Find a replacement for it. Keep appt with pain center.

## 2010-10-05 NOTE — Progress Notes (Signed)
Subjective:    Patient ID: Jeffrey Lin, male    DOB: [birth year], 35 y.o.   MRN: [id number]  HPI  The patient is here for annual wellness exam and preventative care.    Seen recently for chronic pain issues. Has referral in place to other pain center, has appointment on Monday. PAin continues to be severe in leg and hip.  Elevated Cholesterol: Well controlled with diet.  Prediabetes: poor control, no better. Moderate diet.  Anxiety treated by Dr. Evelene Croon, stable.  No current exercise, does walk at work. Fruits and veggies, water.   Smoking cessation. Could not afford chantix, had SE to wellbutrin.  Plans to wean down then stop.    Review of Systems  Constitutional: Negative for fever, fatigue and unexpected weight change.  HENT: Negative for ear pain, congestion, sore throat, rhinorrhea, trouble swallowing and postnasal drip.   Eyes: Negative for pain.  Respiratory: Negative for cough, shortness of breath and wheezing.   Cardiovascular: Negative for chest pain, palpitations and leg swelling.  Gastrointestinal: Negative for nausea, abdominal pain, diarrhea, constipation and blood in stool.  Genitourinary: Negative for dysuria, urgency, hematuria, discharge, penile swelling, scrotal swelling, difficulty urinating, penile pain and testicular pain.  Skin: Negative for rash.  Neurological: Negative for syncope, weakness, light-headedness, numbness and headaches.  Psychiatric/Behavioral: Negative for behavioral problems and dysphoric mood. The patient is not nervous/anxious.        Objective:   Physical Exam  Constitutional: He appears well-developed and well-nourished.  Non-toxic appearance. He does not appear ill. No distress.  HENT:  Head: Normocephalic and atraumatic.  Right Ear: Hearing, tympanic membrane, external ear and ear canal normal.  Left Ear: Hearing, tympanic membrane, external ear and ear canal normal.  Nose: Nose normal.  Mouth/Throat: Uvula is midline,  oropharynx is clear and moist and mucous membranes are normal.  Eyes: Conjunctivae, EOM and lids are normal. Pupils are equal, round, and reactive to light. No foreign bodies found.  Neck: Trachea normal, normal range of motion and phonation normal. Neck supple. Carotid bruit is not present. No mass and no thyromegaly present.  Cardiovascular: Normal rate, regular rhythm, S1 normal, S2 normal, intact distal pulses and normal pulses.  Exam reveals no gallop.   No murmur heard. Pulmonary/Chest: Breath sounds normal. He has no wheezes. He has no rhonchi. He has no rales.  Abdominal: Soft. Normal appearance and bowel sounds are normal. There is no hepatosplenomegaly. There is no tenderness. There is no rebound, no guarding and no CVA tenderness. No hernia. Hernia confirmed negative in the right inguinal area and confirmed negative in the left inguinal area.  Genitourinary: Testes normal and penis normal. Right testis shows no mass and no tenderness. Left testis shows no mass and no tenderness. No paraphimosis or penile tenderness.  Musculoskeletal:       Very tender to touch   Lymphadenopathy:    He has no cervical adenopathy.       Right: No inguinal adenopathy present.       Left: No inguinal adenopathy present.  Neurological: He is alert. He has normal strength and normal reflexes. No cranial nerve deficit or sensory deficit. Gait normal.  Skin: Skin is warm, dry and intact. No rash noted.  Psychiatric: He has a normal mood and affect. His speech is normal and behavior is normal. Judgment and thought content normal.          Assessment & Plan:  The patient's preventative maintenance and recommended screening tests for an annual wellness  exam were reviewed in full today. Brought up to date unless services declined.  Counselled on the importance of diet, exercise, and its role in overall health and mortality. The patient's FH and SH was reviewed, including their home life, tobacco status,  and drug and alcohol status.   Uptodate with Tdap. Refused flu vac.

## 2010-10-25 ENCOUNTER — Encounter: Payer: Self-pay | Admitting: Family Medicine

## 2010-10-25 ENCOUNTER — Other Ambulatory Visit: Payer: Self-pay | Admitting: Family Medicine

## 2011-03-20 ENCOUNTER — Ambulatory Visit (INDEPENDENT_AMBULATORY_CARE_PROVIDER_SITE_OTHER): Payer: BC Managed Care – PPO | Admitting: Family Medicine

## 2011-03-20 ENCOUNTER — Encounter: Payer: Self-pay | Admitting: Family Medicine

## 2011-03-20 DIAGNOSIS — R110 Nausea: Secondary | ICD-10-CM

## 2011-03-20 DIAGNOSIS — R197 Diarrhea, unspecified: Secondary | ICD-10-CM

## 2011-03-20 LAB — CBC WITH DIFFERENTIAL/PLATELET
Eosinophils Relative: 0.6 % (ref 0.0–5.0)
HCT: 44.6 % (ref 39.0–52.0)
Hemoglobin: 15.2 g/dL (ref 13.0–17.0)
Lymphs Abs: 3.1 10*3/uL (ref 0.7–4.0)
MCV: 94.9 fl (ref 78.0–100.0)
Monocytes Absolute: 0.5 10*3/uL (ref 0.1–1.0)
Monocytes Relative: 4.7 % (ref 3.0–12.0)
Neutro Abs: 7.1 10*3/uL (ref 1.4–7.7)
WBC: 10.8 10*3/uL — ABNORMAL HIGH (ref 4.5–10.5)

## 2011-03-20 LAB — BASIC METABOLIC PANEL
Chloride: 106 mEq/L (ref 96–112)
GFR: 87.44 mL/min (ref >60.00)
Glucose, Bld: 125 mg/dL — ABNORMAL HIGH (ref 70–99)
Potassium: 4.3 mEq/L (ref 3.5–5.1)
Sodium: 141 mEq/L (ref 135–145)

## 2011-03-20 LAB — HEPATIC FUNCTION PANEL
ALT: 8 U/L (ref 0–53)
AST: 12 U/L (ref 0–37)
Albumin: 4.5 g/dL (ref 3.5–5.2)
Total Protein: 7.4 g/dL (ref 6.0–8.3)

## 2011-03-20 MED ORDER — PROMETHAZINE HCL 25 MG PO TABS: 25.0000 mg | ORAL_TABLET | Freq: Four times a day (QID) | ORAL | Status: DC | PRN

## 2011-03-20 NOTE — Progress Notes (Signed)
  Patient Name: Jeffrey Lin Date of Birth: [birth year] Age: 36 y.o. Medical Record Number: [id number] Gender: male Date of Encounter: 03/20/2011  History of Present Illness:  Jeffrey Lin is a 36 y.o. very pleasant male patient who presents with the following:  Last week, Mon and Tuesday and felt really nauseated. Got over that and when he started to bend over, started to get the shakes and started to get hot and cold. Hot and cold. Yesterday and last night ---- also last week.   Urinating some Thrown up about 8-10 times Diarrhea, 4-5 times in last 24 hours  Generally he does not feel very well, he does have decreased p.o. Intake.  He denies significant alcohol use, drug use.  Past Medical History, Surgical History, Social History, Family History, Problem List, Medications, and Allergies have been reviewed and updated if relevant.  Review of Systems: ROS: GEN: Acute illness details above GI: Tolerating PO intake GU: maintaining adequate hydration and urination Pulm: No SOB Interactive and getting along well at home.  Otherwise, ROS is as per the HPI.   Physical Examination: Filed Vitals:   03/20/11 0920  BP: 110/72  Pulse: 98  Temp: 97.8 F (36.6 C)  TempSrc: Oral  Height: 6\' 6"  (1.981 m)  Weight: 188 lb 12.8 oz (85.639 kg)  SpO2: 98%    Body mass index is 21.82 kg/(m^2).   GEN: WDWN, NAD, Non-toxic, A & O x 3 HEENT: Atraumatic, Normocephalic. Neck supple. No masses, No LAD. Ears and Nose: No external deformity. CV: RRR, No M/G/R. No JVD. No thrill. No extra heart sounds. PULM: CTA B, no wheezes, crackles, rhonchi. No retractions. No resp. distress. No accessory muscle use. ABD: hyperactive bowel sounds. Soft, nondistended.  No rebound. EXTR: No c/c/e NEURO Normal gait.  PSYCH: Normally interactive. Conversant. Not depressed or anxious appearing.  Calm demeanor.    Assessment and Plan: 1. Nausea  Basic metabolic panel, CBC with Differential, Hepatic function  panel, promethazine (PHENERGAN) 25 MG tablet  2. Diarrhea      Most likely gastroenteritis. Cannot exclude other pathology, check for potential liver failure or renal impairment. Grossly, the patient appears mildly ill. Gentle hydration.  Followup if not improving in short order.

## 2011-03-20 NOTE — Patient Instructions (Signed)
Prevent dehydration: drink Gatorade, Pedialyte, Ginger Ale, popsicles  Immodium A-D over ther counter or Pepto-Bismol   day 1: clear liquids day 1: clear liquids--2-3 oz every 45-60 min. SMALL SIPS OR ICE CHIPS 7-up, ginger ale, sprite tea--no cofee chicken broth plain jello Water  Day 2: contnue clear liquids and add BRAT diet B--banana R--rice---can use chicken noodle or rice soup A--apple sauce T--dry toast GRITS, CREAM OF WHEAT, OATMEAL OK  Day 3: continue day 2 and add simple, non fat non spicy foods1 at a time to diet as canned peaches or pears backed or broiled chicken breast---or lunch meat boiled white potato-cook in chicken broth Chicken and rice or chicken noodles  

## 2011-10-01 ENCOUNTER — Encounter: Payer: Self-pay | Admitting: Family Medicine

## 2011-10-01 ENCOUNTER — Ambulatory Visit (INDEPENDENT_AMBULATORY_CARE_PROVIDER_SITE_OTHER): Payer: BC Managed Care – PPO | Admitting: Family Medicine

## 2011-10-01 VITALS — BP 112/74 | HR 84 | Temp 97.5°F | Wt 180.5 lb

## 2011-10-01 DIAGNOSIS — J40 Bronchitis, not specified as acute or chronic: Secondary | ICD-10-CM | POA: Insufficient documentation

## 2011-10-01 MED ORDER — BENZONATATE 100 MG PO CAPS: 100.0000 mg | ORAL_CAPSULE | Freq: Two times a day (BID) | ORAL | Status: AC | PRN

## 2011-10-01 MED ORDER — AZITHROMYCIN 250 MG PO TABS: ORAL_TABLET | ORAL | Status: AC

## 2011-10-01 NOTE — Assessment & Plan Note (Signed)
In smoker. Treat aggressively with zpack. Update if sxs not improved after treatment.

## 2011-10-01 NOTE — Patient Instructions (Addendum)
Sounds like you have a bronchitis.  Treat with zpack.  Use tessalon perls as needed for cough. Push fluids and plenty of rest. Please return if you are not improving as expected, or if you have high fevers (>101.5) or difficulty swallowing or worsening productive cough. Call clinic with questions.  Good to see you today. Work note provided for today.

## 2011-10-01 NOTE — Progress Notes (Signed)
  Subjective:    Patient ID: Jeffrey Lin, male    DOB: [birth year], 36 y.o.   MRN: [id number]  HPI CC: cough  6d h/o ST, congestion, cough.  Chest started hurting 2/2 cough.  Very tight as well.  Deep sounding coughing productive of bright green sputum.  Some chills at home.  Hoarse voice.  + mild headache.  Cough keeping him up at night.    Has tried dayquil, nyquil, but not helping.    No fevers, abd pain, n/v, ear or tooth pain.    Smoker - 1 ppd.  Has cut back from 2.5 ppd.  Father recently sick.  H/o bronchitis in past.  No h/o asthma, COPD.  H/o mandibular reconstruction after tumor surgery ~2003  Review of Systems Per HPI    Objective:   Physical Exam  Nursing note and vitals reviewed. Constitutional: He appears well-developed and well-nourished. No distress.       Hoarse Nontoxic, tired  HENT:  Head: Normocephalic and atraumatic.  Right Ear: Hearing, tympanic membrane, external ear and ear canal normal.  Left Ear: Hearing, tympanic membrane, external ear and ear canal normal.  Nose: Mucosal edema present. No rhinorrhea. Right sinus exhibits maxillary sinus tenderness (mild). Right sinus exhibits no frontal sinus tenderness. Left sinus exhibits maxillary sinus tenderness (mild). Left sinus exhibits no frontal sinus tenderness.  Mouth/Throat: Uvula is midline, oropharynx is clear and moist and mucous membranes are normal. No oropharyngeal exudate, posterior oropharyngeal edema, posterior oropharyngeal erythema or tonsillar abscesses.  Eyes: Conjunctivae and EOM are normal. Pupils are equal, round, and reactive to light. No scleral icterus.  Neck: Normal range of motion. Neck supple.  Cardiovascular: Normal rate, regular rhythm, normal heart sounds and intact distal pulses.   No murmur heard. Pulmonary/Chest: Effort normal and breath sounds normal. No respiratory distress. He has no wheezes. He has no rales.       Mildly decreased BS left basilar  Deep hoarse cough  present  Lymphadenopathy:    He has no cervical adenopathy.  Skin: Skin is warm and dry. No rash noted.       Assessment & Plan:

## 2012-04-24 ENCOUNTER — Emergency Department (HOSPITAL_COMMUNITY): Payer: Self-pay

## 2012-04-24 ENCOUNTER — Emergency Department (HOSPITAL_COMMUNITY)
Admission: EM | Admit: 2012-04-24 | Discharge: 2012-04-24 | Disposition: A | Discharge status: Home / Self Care | Payer: Self-pay | Attending: Emergency Medicine | Admitting: Emergency Medicine

## 2012-04-24 ENCOUNTER — Telehealth: Payer: Self-pay

## 2012-04-24 ENCOUNTER — Encounter (HOSPITAL_COMMUNITY): Payer: Self-pay

## 2012-04-24 DIAGNOSIS — R61 Generalized hyperhidrosis: Secondary | ICD-10-CM | POA: Insufficient documentation

## 2012-04-24 DIAGNOSIS — R251 Tremor, unspecified: Secondary | ICD-10-CM

## 2012-04-24 DIAGNOSIS — F411 Generalized anxiety disorder: Secondary | ICD-10-CM | POA: Insufficient documentation

## 2012-04-24 DIAGNOSIS — R259 Unspecified abnormal involuntary movements: Secondary | ICD-10-CM | POA: Insufficient documentation

## 2012-04-24 DIAGNOSIS — F17200 Nicotine dependence, unspecified, uncomplicated: Secondary | ICD-10-CM | POA: Insufficient documentation

## 2012-04-24 DIAGNOSIS — Z8739 Personal history of other diseases of the musculoskeletal system and connective tissue: Secondary | ICD-10-CM | POA: Insufficient documentation

## 2012-04-24 DIAGNOSIS — R000 Tachycardia, unspecified: Secondary | ICD-10-CM | POA: Insufficient documentation

## 2012-04-24 DIAGNOSIS — Z79899 Other long term (current) drug therapy: Secondary | ICD-10-CM | POA: Insufficient documentation

## 2012-04-24 HISTORY — DX: Anxiety disorder, unspecified: F41.9

## 2012-04-24 LAB — COMPREHENSIVE METABOLIC PANEL
ALT: 14 U/L (ref 0–53)
AST: 13 U/L (ref 0–37)
Albumin: 4.4 g/dL (ref 3.5–5.2)
Alkaline Phosphatase: 102 U/L (ref 39–117)
Chloride: 102 mEq/L (ref 96–112)
Potassium: 3.7 mEq/L (ref 3.5–5.1)
Sodium: 143 mEq/L (ref 135–145)
Total Bilirubin: 0.5 mg/dL (ref 0.3–1.2)

## 2012-04-24 LAB — CBC WITH DIFFERENTIAL/PLATELET
Basophils Absolute: 0 10*3/uL (ref 0.0–0.1)
Basophils Relative: 0 % (ref 0–1)
Hemoglobin: 16.2 g/dL (ref 13.0–17.0)
MCHC: 36.3 g/dL — ABNORMAL HIGH (ref 30.0–36.0)
Neutro Abs: 8.5 10*3/uL — ABNORMAL HIGH (ref 1.7–7.7)
Neutrophils Relative %: 71 % (ref 43–77)
RDW: 12.4 % (ref 11.5–15.5)
WBC: 11.9 10*3/uL — ABNORMAL HIGH (ref 4.0–10.5)

## 2012-04-24 LAB — URINALYSIS, ROUTINE W REFLEX MICROSCOPIC
Glucose, UA: NEGATIVE mg/dL
Ketones, ur: 15 mg/dL — AB
pH: 6 (ref 5.0–8.0)

## 2012-04-24 LAB — CK: Total CK: 115 U/L (ref 7–232)

## 2012-04-24 LAB — GLUCOSE, CAPILLARY: Glucose-Capillary: 107 mg/dL — ABNORMAL HIGH (ref 70–99)

## 2012-04-24 LAB — URINE MICROSCOPIC-ADD ON

## 2012-04-24 LAB — TYPE AND SCREEN: ABO/RH(D): O POS

## 2012-04-24 MED ORDER — SODIUM CHLORIDE 0.9 % IV BOLUS (SEPSIS)
1000.0000 mL | Freq: Once | INTRAVENOUS | Status: AC
  Administered 2012-04-24: 1000 mL via INTRAVENOUS

## 2012-04-24 MED ORDER — ACETAMINOPHEN 325 MG PO TABS
650.0000 mg | ORAL_TABLET | Freq: Once | ORAL | Status: AC
  Administered 2012-04-24: 650 mg via ORAL
  Filled 2012-04-24: qty 2

## 2012-04-24 MED ORDER — LORAZEPAM 2 MG/ML IJ SOLN
2.0000 mg | Freq: Once | INTRAMUSCULAR | Status: AC
  Administered 2012-04-24: 2 mg via INTRAVENOUS
  Filled 2012-04-24: qty 1

## 2012-04-24 MED ORDER — LORAZEPAM 2 MG/ML IJ SOLN: 1.0000 mg | INTRAMUSCULAR | Status: DC | PRN

## 2012-04-24 MED ORDER — LORAZEPAM 2 MG/ML IJ SOLN
INTRAMUSCULAR | Status: AC
  Filled 2012-04-24: qty 1

## 2012-04-24 MED ORDER — HYDROMORPHONE HCL PF 1 MG/ML IJ SOLN
1.0000 mg | Freq: Once | INTRAMUSCULAR | Status: AC
  Administered 2012-04-24: 1 mg via INTRAVENOUS
  Filled 2012-04-24: qty 1
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
## 2012-04-24 NOTE — ED Notes (Signed)
Spoke with poison control.  Update given on pt's status.  They feel pt is appropriate to go home.

## 2012-04-24 NOTE — ED Notes (Signed)
PA at bedside.

## 2012-04-24 NOTE — ED Notes (Signed)
Checked patient blood sugar it was 107 notified RN [employer]

## 2012-04-24 NOTE — ED Notes (Signed)
Pt given discharge paperwork.  No additional questions by pt regarding d/c or f/u.  VSS. Resps e/u.  No diaphoresis or tremors noted.  Ambulatory on discharge. E-signature obtained.

## 2012-04-24 NOTE — ED Notes (Signed)
Phlebotomy at bedside.
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
## 2012-04-24 NOTE — Telephone Encounter (Signed)
Agree with disposition. 

## 2012-04-24 NOTE — Telephone Encounter (Signed)
pts mother called spoke with Bonita Quin at front desk, placed on hold and Bonita Quin asked me to speak with her; when I picked up pts mother had hung up and I called her back and she had already called 911; pt shaky,elevated BP 128/112 and some confusion started today at 8 AM. On 04/17/12 and 04/18/12 pt had similar episode that only lasted for short period and went away. Today pt not having any pain except h/a. Pt feels light headed,h/a, weakness in both arms and legs,face drawing to left side and then relaxes and face goes back to normal, blurred vision and blinking eyes rapidly; pt very anxious. Pt is alert and knows where he is and who his mother is but thinks someone who has been gone for one month is still there and thinks there is a cat in the room and there is not. Pt has no CP or SOB. First responders have arrived and pts mother said would be going to [facility] ED.

## 2012-04-24 NOTE — ED Notes (Signed)
Per EMS pt from home, pt c/o all over body tremors starting this am, pt had a similar episode earlier this week but did not get evaluated. Pt w/full body involuntary movement. 18 G LFA

## 2012-04-24 NOTE — ED Notes (Signed)
Results of lactic acid shown to primary nurse 

## 2012-04-24 NOTE — ED Notes (Signed)
Patient transported to CT, transported via stretcher on a monitor by [employer] and [employer] nursing student

## 2012-04-25 LAB — URINE CULTURE

## 2012-04-27 ENCOUNTER — Emergency Department (HOSPITAL_COMMUNITY)
Admission: EM | Admit: 2012-04-27 | Discharge: 2012-04-27 | Disposition: A | Discharge status: Other Acute Inpatient Hospital | Payer: Self-pay | Attending: Emergency Medicine | Admitting: Emergency Medicine

## 2012-04-27 ENCOUNTER — Encounter (HOSPITAL_COMMUNITY): Payer: Self-pay

## 2012-04-27 DIAGNOSIS — F411 Generalized anxiety disorder: Secondary | ICD-10-CM | POA: Insufficient documentation

## 2012-04-27 DIAGNOSIS — G8929 Other chronic pain: Secondary | ICD-10-CM | POA: Insufficient documentation

## 2012-04-27 DIAGNOSIS — F17200 Nicotine dependence, unspecified, uncomplicated: Secondary | ICD-10-CM | POA: Insufficient documentation

## 2012-04-27 DIAGNOSIS — Z79899 Other long term (current) drug therapy: Secondary | ICD-10-CM | POA: Insufficient documentation

## 2012-04-27 DIAGNOSIS — F29 Unspecified psychosis not due to a substance or known physiological condition: Secondary | ICD-10-CM | POA: Insufficient documentation

## 2012-04-27 LAB — CBC WITH DIFFERENTIAL/PLATELET
Basophils Relative: 0 % (ref 0–1)
HCT: 42.9 % (ref 39.0–52.0)
Hemoglobin: 15.3 g/dL (ref 13.0–17.0)
Lymphs Abs: 2.3 10*3/uL (ref 0.7–4.0)
MCH: 32.6 pg (ref 26.0–34.0)
MCHC: 35.7 g/dL (ref 30.0–36.0)
Monocytes Absolute: 0.9 10*3/uL (ref 0.1–1.0)
Monocytes Relative: 10 % (ref 3–12)
Neutro Abs: 5.1 10*3/uL (ref 1.7–7.7)

## 2012-04-27 LAB — URINE MICROSCOPIC-ADD ON

## 2012-04-27 LAB — URINALYSIS, ROUTINE W REFLEX MICROSCOPIC
Glucose, UA: NEGATIVE mg/dL
Hgb urine dipstick: NEGATIVE
Specific Gravity, Urine: 1.035 — ABNORMAL HIGH (ref 1.005–1.030)
Urobilinogen, UA: 1 mg/dL (ref 0.0–1.0)

## 2012-04-27 LAB — RAPID URINE DRUG SCREEN, HOSP PERFORMED
Amphetamines: NOT DETECTED
Barbiturates: NOT DETECTED
Benzodiazepines: POSITIVE — AB
Tetrahydrocannabinol: NOT DETECTED

## 2012-04-27 LAB — COMPREHENSIVE METABOLIC PANEL
ALT: 16 U/L (ref 0–53)
AST: 22 U/L (ref 0–37)
Calcium: 10 mg/dL (ref 8.4–10.5)
Sodium: 140 mEq/L (ref 135–145)
Total Protein: 8 g/dL (ref 6.0–8.3)

## 2012-04-27 NOTE — ED Notes (Signed)
Okay for pt to be DC to [facility], pertinent info faxed, will call to see if received fax.

## 2012-04-27 NOTE — ED Notes (Signed)
Spoke to [place] at [facility], okay for pt to return, pt DC with [employer] with [employer], chart and personal belongings.

## 2012-04-27 NOTE — ED Provider Notes (Signed)
History     CSN: [id number]  Arrival date & time 04/27/12  4332   First MD Initiated Contact with Patient 04/27/12 [id number]      Chief complaint: psychiatric illness, medical clearance   (Consider location/radiation/quality/duration/timing/severity/associated sxs/prior treatment) The history is provided by the patient. The history is limited by the condition of the patient.  pt arrives to ED from [facility] via security for medical clearance.  Pt w hx anxiety, ?depression, w recent bizaree behavior, was wandering in neighbors yard, talking about being in a video game, expressing paranoid delusions about police and others being out to kill him and his family, talking about a giant dragonfly killing family, etc.  Pt uncooperative w history - level 5 caveat.     Past Medical History  Diagnosis Date  . Chronic leg pain   . Anxiety     Past Surgical History  Procedure Laterality Date  . 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 Bone graft from hip to jaw  2004  . Bone graft right side of head to jaw  2006  . Sphenopalatine ganglionic    . Block procedure at pain center  03/27/06    Family History  Problem Relation Age of Onset  . Hyperlipidemia Mother   . Hypertension Mother     History  Substance Use Topics  . Smoking status: Current Every Day Smoker -- 1.00 packs/day for 10 years  . Smokeless tobacco: Not on file  . Alcohol Use: Yes     Comment: rare      Review of Systems  Unable to perform ROS: Psychiatric disorder  level 5 caveat  Allergies  Review of patient's allergies indicates no known allergies.  Home Medications   Current Outpatient Rx  Name  Route  Sig  Dispense  Refill  . ALPRAZolam (XANAX) 1 MG tablet   Oral   Take 1 mg by mouth at bedtime as needed for sleep.         Marland Kitchen oxyCODONE (ROXICODONE) 15 MG immediate release tablet   Oral   Take 15 mg by mouth every 6 (six) hours as needed for pain. Prescribed by Dr. Nilsa Nutting at pain center.          Marland Kitchen EXPIRED: promethazine (PHENERGAN) 25 MG tablet   Oral   Take 1 tablet (25 mg total) by mouth every 6 (six) hours as needed for nausea.   30 tablet   2     BP 133/84  Pulse 108  Temp(Src) 98.1 F (36.7 C) (Oral)  Resp 18  SpO2 95%  Physical Exam  Nursing note and vitals reviewed. Constitutional: He appears well-developed and well-nourished. No distress.  HENT:  Head: Atraumatic.  Mouth/Throat: Oropharynx is clear and moist.  Eyes: Conjunctivae are normal. Pupils are equal, round, and reactive to light. No scleral icterus.  Neck: Neck supple. No tracheal deviation present. No thyromegaly present.  Cardiovascular: Normal rate, regular rhythm, normal heart sounds and intact distal pulses.   Pulmonary/Chest: Effort normal and breath sounds normal. No accessory muscle usage. No respiratory distress.  Abdominal: Soft. He exhibits no distension. There is no tenderness.  Musculoskeletal: Normal range of motion. He exhibits no edema and no tenderness.  Neurological: He is alert.  Speech fluent. Motor intact bil. Steady gait.   Skin: Skin is warm and dry. He is not diaphoretic.  Psychiatric: He has a normal mood and affect.  Pt appears anxious. Expresses vague thoughts of others out to get him, control  his thoughts, but generally non compliant w h and p.     ED Course  Procedures (including critical care time)  Results for orders placed during the hospital encounter of 04/27/12  COMPREHENSIVE METABOLIC PANEL      Result Value Range   Sodium 140  135 - 145 mEq/L   Potassium 3.7  3.5 - 5.1 mEq/L   Chloride 98  96 - 112 mEq/L   CO2 29  19 - 32 mEq/L   Glucose, Bld 109 (*) 70 - 99 mg/dL   BUN 12  6 - 23 mg/dL   Creatinine, Ser 1.61  0.50 - 1.35 mg/dL   Calcium 09.6  8.4 - 04.5 mg/dL   Total Protein 8.0  6.0 - 8.3 g/dL   Albumin 4.6  3.5 - 5.2 g/dL   AST 22  0 - 37 U/L   ALT 16  0 - 53 U/L   Alkaline Phosphatase 94  39 - 117 U/L   Total Bilirubin 0.7  0.3 - 1.2 mg/dL    GFR calc non Af Amer 82 (*) >90 mL/min   GFR calc Af Amer >90  >90 mL/min  CBC WITH DIFFERENTIAL      Result Value Range   WBC 8.3  4.0 - 10.5 K/uL   RBC 4.70  4.22 - 5.81 MIL/uL   Hemoglobin 15.3  13.0 - 17.0 g/dL   HCT 40.9  81.1 - 91.4 %   MCV 91.3  78.0 - 100.0 fL   MCH 32.6  26.0 - 34.0 pg   MCHC 35.7  30.0 - 36.0 g/dL   RDW 78.2  95.6 - 21.3 %   Platelets 237  150 - 400 K/uL   Neutrophils Relative 61  43 - 77 %   Neutro Abs 5.1  1.7 - 7.7 K/uL   Lymphocytes Relative 28  12 - 46 %   Lymphs Abs 2.3  0.7 - 4.0 K/uL   Monocytes Relative 10  3 - 12 %   Monocytes Absolute 0.9  0.1 - 1.0 K/uL   Eosinophils Relative 0  0 - 5 %   Eosinophils Absolute 0.0  0.0 - 0.7 K/uL   Basophils Relative 0  0 - 1 %   Basophils Absolute 0.0  0.0 - 0.1 K/uL  URINALYSIS, ROUTINE W REFLEX MICROSCOPIC      Result Value Range   Color, Urine ORANGE (*) YELLOW   APPearance CLOUDY (*) CLEAR   Specific Gravity, Urine 1.035 (*) 1.005 - 1.030   pH 6.0  5.0 - 8.0   Glucose, UA NEGATIVE  NEGATIVE mg/dL   Hgb urine dipstick NEGATIVE  NEGATIVE   Bilirubin Urine SMALL (*) NEGATIVE   Ketones, ur 15 (*) NEGATIVE mg/dL   Protein, ur 086 (*) NEGATIVE mg/dL   Urobilinogen, UA 1.0  0.0 - 1.0 mg/dL   Nitrite NEGATIVE  NEGATIVE   Leukocytes, UA TRACE (*) NEGATIVE  ETHANOL      Result Value Range   Alcohol, Ethyl (B) <11  0 - 11 mg/dL  URINE MICROSCOPIC-ADD ON      Result Value Range   Squamous Epithelial / LPF RARE  RARE   WBC, UA 0-2  <3 WBC/hpf   Bacteria, UA FEW (*) RARE   Casts GRANULAR CAST (*) NEGATIVE   Urine-Other MUCOUS PRESENT     Ct Head Wo Contrast  04/24/2012  *RADIOLOGY REPORT*  Clinical Data: Shaky, headache, confusion, weakness in arms and legs, blurred vision  CT HEAD WITHOUT CONTRAST  Technique:  Contiguous axial images were obtained from the base of the skull through the vertex without contrast.  Comparison: 05/28/2010  Findings: Normal ventricular morphology. No midline shift or mass  effect. Prior right parietal craniotomy. No intracranial hemorrhage, mass lesion or evidence of acute infarction. No extra-axial fluid collections. Streak artifacts at skull base. No acute bony or sinus abnormality.  IMPRESSION: Prior right parietal craniotomy. No acute intracranial abnormalities.   Original Report Authenticated By: Ulyses Southward, M.D.        MDM  Labs.  Pt w security from [facility], he will return there once medical clearance labs done.  Reviewed nursing notes and prior charts for additional history.   Labs reviewed.  Pt stable for d/c back to [facility].           Suzi Roots, MD 04/27/12 1003

## 2012-04-27 NOTE — ED Notes (Signed)
Pt brought in with IVP by [facility] d/t bizarre behaviors, found wandering in neighbors yard.

## 2012-04-27 NOTE — ED Notes (Signed)
Pt brought in by [facility] for medical clearance, Jeffrey Lin is at bedside. Pt is in cuffs and coorapertive at this time.

## 2012-05-02 NOTE — ED Provider Notes (Signed)
Shared service with midlevel provider. I have personally seen and examined the patient, providing direct face to face care, presenting with the chief complaint of tremors. Physical exam findings include tremors, tachycardia. Suspicion of either tox withdrawal or OD with a sympathomimetic drug. Plan will be to monitor closely and get poison control recs. I have reviewed the nursing documentation on past medical history, family history, and social history.  Derwood Kaplan, MD 05/02/12 0006

## 2012-05-14 ENCOUNTER — Ambulatory Visit (INDEPENDENT_AMBULATORY_CARE_PROVIDER_SITE_OTHER): Payer: Self-pay | Admitting: Family Medicine

## 2012-05-14 ENCOUNTER — Encounter: Payer: Self-pay | Admitting: Family Medicine

## 2012-05-14 VITALS — BP 130/70 | HR 118 | Temp 97.4°F | Ht 78.0 in | Wt 189.5 lb

## 2012-05-14 DIAGNOSIS — F411 Generalized anxiety disorder: Secondary | ICD-10-CM

## 2012-05-14 DIAGNOSIS — R404 Transient alteration of awareness: Secondary | ICD-10-CM

## 2012-05-14 DIAGNOSIS — F39 Unspecified mood [affective] disorder: Secondary | ICD-10-CM

## 2012-05-14 DIAGNOSIS — R400 Somnolence: Secondary | ICD-10-CM

## 2012-05-14 DIAGNOSIS — G8929 Other chronic pain: Secondary | ICD-10-CM
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
## 2012-05-14 NOTE — Patient Instructions (Addendum)
Wean off neurontin to 2 tabs daily x 3 days then 1 tab daily x 1 day then off. Follow up in 1 month. Talk with Dr Evelene Croon about finding cheaper option for seroquel.

## 2012-05-14 NOTE — Assessment & Plan Note (Signed)
Improved on seroquel. Followed by Jeffrey Lin.

## 2012-05-14 NOTE — Assessment & Plan Note (Signed)
Per [facility].Marland Kitchen Pt states he cannot afford to see them... I have informed pt he cannot be seen here given past issues with medication misuse.  Pain is not better with neurontin, minimally helpful per pt.

## 2012-05-14 NOTE — Assessment & Plan Note (Signed)
Stable per pt. Withdrawal at ER visit was likely from being out of xanax as pt adamantly denies alcohol use and he lives with his mother and she has not noted him using alcohol or other drugs.

## 2012-05-14 NOTE — Assessment & Plan Note (Signed)
Likely due to multiple sedating meds. Encouraged him to continue seroquel.   We will wean off neurontin as it is minimally helpful. Tis meay help with sedation some. Counsled him not to drive until feeling more aware

## 2012-06-16 ENCOUNTER — Ambulatory Visit: Payer: Self-pay | Admitting: Family Medicine

## 2012-06-16 DIAGNOSIS — Z0289 Encounter for other administrative examinations: Secondary | ICD-10-CM

## 2012-07-17 ENCOUNTER — Encounter: Payer: Self-pay | Admitting: Family Medicine

## 2012-07-17 ENCOUNTER — Encounter: Payer: Self-pay | Admitting: *Deleted

## 2012-07-17 ENCOUNTER — Ambulatory Visit (INDEPENDENT_AMBULATORY_CARE_PROVIDER_SITE_OTHER): Payer: Self-pay | Admitting: Family Medicine

## 2012-07-17 VITALS — BP 146/94 | HR 91 | Temp 97.6°F | Wt 189.5 lb

## 2012-07-17 DIAGNOSIS — G8929 Other chronic pain: Secondary | ICD-10-CM

## 2012-07-17 NOTE — Patient Instructions (Addendum)
Stop at lab on your way out for urine drug screen. Drop release of info form at front. We will call you after I review records.

## 2012-07-17 NOTE — Assessment & Plan Note (Signed)
Will obtain records from pain center. Last urine drug screens.  he has essentially weaned himself off oxycodone to hydrocodone and now off.., doubt withdrawal will occur.. Pt to go to ER for severe withdrawal symptoms. I have told him I will not prescribe pain medication.  Instructed him to instead use meloxicam or ibuprofen for pain. We will look into the possibility of referral to a different pain center... If they will accept him.
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
## 2012-07-31 ENCOUNTER — Encounter: Payer: Self-pay | Admitting: Family Medicine

## 2012-08-07 ENCOUNTER — Other Ambulatory Visit: Payer: Self-pay

## 2012-08-07 MED ORDER — GABAPENTIN 300 MG PO CAPS: 300.0000 mg | ORAL_CAPSULE | Freq: Three times a day (TID) | ORAL | Status: DC

## 2012-08-07 NOTE — Telephone Encounter (Signed)
Pt was seen at [facility] and pt was started on Neurontin. Pt no longer sees that physician. Pt request ref[facility]. Pt request cb when done. Pt had drug test ([facility] ) in June and has not heard results yet.Please advise.

## 2012-08-07 NOTE — Telephone Encounter (Signed)
Patient advised and he says the gabapentin is working great for him and he doesn't need any controlled substances for pain now. Patient will make appt to follow up with u in a few months

## 2012-08-07 NOTE — Telephone Encounter (Signed)
Drug test looked good , but given past issue we are not going to prescribe controlled substaance here. He hcan recive other care here though. We are willing to try to refer him to another pain center if he is interested.  Sent in refill of gabapentin.

## 2012-08-31 ENCOUNTER — Other Ambulatory Visit: Payer: Self-pay

## 2012-08-31 NOTE — Telephone Encounter (Signed)
Pt said taking gabapentin 4 x a day due to back and knee pain. Pt request instructions changed to qid; pt said it will be another month or two before can schedule appt with Dr Ermalene Searing. [facility].

## 2012-09-01 ENCOUNTER — Other Ambulatory Visit: Payer: Self-pay

## 2012-09-01 MED ORDER — GABAPENTIN 300 MG PO CAPS: 300.0000 mg | ORAL_CAPSULE | Freq: Four times a day (QID) | ORAL | Status: DC

## 2012-09-01 NOTE — Telephone Encounter (Signed)
Opened in error

## 2012-09-01 NOTE — Telephone Encounter (Signed)
Pt called back and pt is out of medication and request med sent to [facility] today with new directions.Please advise.pt request cb.

## 2012-09-01 NOTE — Telephone Encounter (Signed)
Pt left v/m requesting status of gabapentin refill. Pt request cb.

## 2012-09-01 NOTE — Telephone Encounter (Signed)
Pt notified med sent to [facility]; pt will wait to hear from Dr Daphine Deutscher response also.

## 2012-09-01 NOTE — Telephone Encounter (Signed)
Reviewed pt's chart. Ok to increase gabapentin to qid. Sent 1 mo supply while Dr. B looks at note.

## 2012-09-01 NOTE — Telephone Encounter (Signed)
Agree with increase in dose.

## 2012-09-02 NOTE — Telephone Encounter (Signed)
Advised patient

## 2012-09-25 ENCOUNTER — Encounter: Payer: Self-pay | Admitting: Family Medicine

## 2012-09-25 ENCOUNTER — Telehealth: Payer: Self-pay | Admitting: *Deleted

## 2012-09-25 ENCOUNTER — Ambulatory Visit (INDEPENDENT_AMBULATORY_CARE_PROVIDER_SITE_OTHER): Payer: Self-pay | Admitting: Family Medicine

## 2012-09-25 VITALS — BP 120/90 | HR 75 | Temp 97.5°F | Ht 78.0 in | Wt 201.5 lb

## 2012-09-25 DIAGNOSIS — G8929 Other chronic pain: Secondary | ICD-10-CM

## 2012-09-25 MED ORDER — PREGABALIN 100 MG PO CAPS: 100.0000 mg | ORAL_CAPSULE | Freq: Two times a day (BID) | ORAL | Status: DC

## 2012-09-25 MED ORDER — GABAPENTIN 300 MG PO CAPS: 300.0000 mg | ORAL_CAPSULE | Freq: Four times a day (QID) | ORAL | Status: DC
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
## 2012-09-25 NOTE — Telephone Encounter (Signed)
Patient left voicemail stating that the Lyrica is going to cost him $300.  Asking for Dr. Ermalene Searing to send in prescription for his Neurontin instead.  Will forward to Dr. Ermalene Searing for review.

## 2012-09-25 NOTE — Patient Instructions (Addendum)
Stop neurontin. Start lyrica at 100 mg twice daily. Call in 1 week if medication not lasting long enough or not strong enough.  Schedule CPX with labs prior in next 6 months.

## 2012-09-25 NOTE — Assessment & Plan Note (Signed)
Will try to change to lyrica to get longer acting relief of pain. Stop neurontin.Marland Kitchen He will call in 1 week if not effective enough.

## 2012-09-25 NOTE — Telephone Encounter (Signed)
Pt called back, pt said when Dr Ermalene Searing changed to Lyrica pt flushed remaining neurontin that he had. Pt request either getting rx for Neurontin or another med that is not as expensive as Lyrica.(Lyrica cost $300.00 without insurance). [facility].Please advise.

## 2012-09-25 NOTE — Telephone Encounter (Signed)
Lyrica to expensive. Change back to neurontin. Linda notified pt.

## 2012-10-19 ENCOUNTER — Ambulatory Visit (INDEPENDENT_AMBULATORY_CARE_PROVIDER_SITE_OTHER): Payer: BC Managed Care – PPO | Admitting: Family Medicine

## 2012-10-19 ENCOUNTER — Encounter: Payer: Self-pay | Admitting: Family Medicine

## 2012-10-19 VITALS — BP 144/90 | HR 101 | Temp 97.6°F | Ht 78.0 in | Wt 197.5 lb

## 2012-10-19 DIAGNOSIS — J209 Acute bronchitis, unspecified: Secondary | ICD-10-CM | POA: Insufficient documentation

## 2012-10-19 MED ORDER — GABAPENTIN 300 MG PO CAPS: 300.0000 mg | ORAL_CAPSULE | Freq: Four times a day (QID) | ORAL | Status: DC

## 2012-10-19 MED ORDER — AZITHROMYCIN 250 MG PO TABS: ORAL_TABLET | ORAL | Status: DC

## 2012-10-19 MED ORDER — GUAIFENESIN-CODEINE 100-10 MG/5ML PO SYRP: 5.0000 mL | ORAL_SOLUTION | Freq: Four times a day (QID) | ORAL | Status: DC | PRN

## 2012-10-19 NOTE — Patient Instructions (Addendum)
Take the zithromax as directed Use cough syrup with caution Rest and drink fluids Update if not starting to improve in a week or if worsening

## 2012-10-19 NOTE — Assessment & Plan Note (Signed)
3 weeks of symptoms in a smoker  Reassuring exam  Cover with zithromax  Cough syrup- robitussin AC prn -warned of sedation Disc symptomatic care - see instructions on AVS Update if not starting to improve in a week or if worsening  Counseled to quit smoking
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
## 2012-11-04 ENCOUNTER — Telehealth: Payer: Self-pay

## 2012-11-04 NOTE — Telephone Encounter (Signed)
Agreed with trial of lyrica.
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
## 2012-11-05 MED ORDER — PREGABALIN 75 MG PO CAPS: ORAL_CAPSULE | ORAL | Status: DC

## 2012-11-05 NOTE — Telephone Encounter (Signed)
[facility]

## 2012-11-06 NOTE — Telephone Encounter (Signed)
Thorvald notified that the prescription for Lyrica has been sent in to [facility]

## 2012-11-26 ENCOUNTER — Other Ambulatory Visit: Payer: Self-pay

## 2012-12-11 ENCOUNTER — Telehealth: Payer: Self-pay

## 2012-12-11 MED ORDER — GABAPENTIN 300 MG PO CAPS: 300.0000 mg | ORAL_CAPSULE | Freq: Four times a day (QID) | ORAL | Status: DC

## 2012-12-11 NOTE — Telephone Encounter (Signed)
Patient notified as instructed by telephone. 

## 2012-12-11 NOTE — Telephone Encounter (Signed)
Pt left v/m that Lyrica is causing pt to be dizzy and stomach problems; pt request to stop Lyrica and [facility] Tried x 2 to contact pt to ask what type stomach problems and when symptoms started but unable to reach pt by phone.Please advise.

## 2012-12-11 NOTE — Telephone Encounter (Signed)
Can stop lyrica and send in gabapentin at previous dose.

## 2013-02-16 ENCOUNTER — Telehealth: Payer: Self-pay | Admitting: Family Medicine

## 2013-02-16 MED ORDER — GABAPENTIN 300 MG PO CAPS: 600.0000 mg | ORAL_CAPSULE | Freq: Three times a day (TID) | ORAL | Status: DC

## 2013-02-16 NOTE — Telephone Encounter (Signed)
Pt left vm saying his Neurontin is no longer working and would like to see if Dr. Ermalene SearingBedsole would increase his dosage.

## 2013-02-16 NOTE — Telephone Encounter (Signed)
Patient notified as instructed by telephone.  Prescription sent to pharmacy.  Updated on medication list.

## 2013-02-16 NOTE — Telephone Encounter (Signed)
Increase to 2 tabs of 300 mg TID. Change on med list and refill for 5 months. Pt to make appt if not improving as expected

## 2013-05-21 ENCOUNTER — Other Ambulatory Visit: Payer: Self-pay | Admitting: Family Medicine

## 2013-05-21 ENCOUNTER — Encounter: Payer: Self-pay | Admitting: Family Medicine

## 2013-05-21 ENCOUNTER — Ambulatory Visit (INDEPENDENT_AMBULATORY_CARE_PROVIDER_SITE_OTHER): Payer: BC Managed Care – PPO | Admitting: Family Medicine

## 2013-05-21 VITALS — BP 120/80 | HR 99 | Temp 97.7°F | Ht 78.0 in | Wt 214.5 lb

## 2013-05-21 DIAGNOSIS — M549 Dorsalgia, unspecified: Secondary | ICD-10-CM | POA: Insufficient documentation

## 2013-05-21 DIAGNOSIS — S335XXA Sprain of ligaments of lumbar spine, initial encounter: Secondary | ICD-10-CM

## 2013-05-21 DIAGNOSIS — M546 Pain in thoracic spine: Secondary | ICD-10-CM

## 2013-05-21 DIAGNOSIS — G8929 Other chronic pain: Secondary | ICD-10-CM

## 2013-05-21 DIAGNOSIS — S39012A Strain of muscle, fascia and tendon of lower back, initial encounter: Secondary | ICD-10-CM

## 2013-05-21 MED ORDER — CYCLOBENZAPRINE HCL 10 MG PO TABS: 10.0000 mg | ORAL_TABLET | Freq: Every evening | ORAL | Status: DC | PRN

## 2013-05-21 MED ORDER — GABAPENTIN 300 MG PO CAPS: 600.0000 mg | ORAL_CAPSULE | Freq: Three times a day (TID) | ORAL | Status: DC

## 2013-05-21 MED ORDER — DICLOFENAC SODIUM 75 MG PO TBEC: 75.0000 mg | DELAYED_RELEASE_TABLET | Freq: Two times a day (BID) | ORAL | Status: DC

## 2013-05-21 NOTE — Progress Notes (Signed)
Pre visit review using our clinic review tool, if applicable. No additional management support is needed unless otherwise documented below in the visit note. 
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
## 2013-05-21 NOTE — Telephone Encounter (Signed)
Pt left vm that he was here this morning and was supposed to have gabapentin refills sent [facility] but it wasn't send.  Ok to refill?

## 2013-05-21 NOTE — Patient Instructions (Signed)
Start low back stretches, heat.  No lifting > 10 lbs and no repetitive bending x 2 weeks. Start antiinflammatory during the day and can use muscle relaxant at night.  Follow up in 2 weeks if symptoms are not improving, call sooner if severe pain.

## 2013-05-21 NOTE — Assessment & Plan Note (Signed)
Treat with NSAIDs, muscle relaxant, heat, and stretches, info given. Follow up in 2 weeks for X-ray, PT etc if not improving.

## 2013-06-10 ENCOUNTER — Other Ambulatory Visit: Payer: Self-pay | Admitting: Family Medicine

## 2013-07-22 ENCOUNTER — Telehealth: Payer: Self-pay

## 2013-07-22 MED ORDER — GABAPENTIN 300 MG PO CAPS: 900.0000 mg | ORAL_CAPSULE | Freq: Three times a day (TID) | ORAL | Status: DC

## 2013-07-22 NOTE — Telephone Encounter (Signed)
[name] notified ok to increase Gabapentin to 900 mg three times a day as long as no sedation.  Patient states [facility]

## 2013-07-22 NOTE — Telephone Encounter (Signed)
Yes he can increase gabapentin to 900 mg ( 3 tabs of 300 ) three times a day as long as no sedation.  refill sent in.

## 2013-07-22 NOTE — Telephone Encounter (Signed)
Pt left v/m; pt taking Gabapentin for anxiety and panic attacks and h/as. Gabapentin 6 pills a day is not helping pt and wants to know if can get increase in med. Pt request cb. Pt is going out of town tonight at 6 PM and wants to know if can increase Gabapentin. Pt is almost out of med. Pt has enough med to last until 07/25/13. Pt will not return home until 07/29/13. Pt is presently taking 2 tabs 4 - 5 times a day;and that is helping anxiety and panic attacks.[facility]

## 2013-07-22 NOTE — Telephone Encounter (Signed)
Pt left v/m requesting cb about increasing gabapentin.

## 2013-09-09 ENCOUNTER — Inpatient Hospital Stay: Payer: Self-pay | Admitting: Internal Medicine

## 2013-09-09 LAB — CBC WITH DIFFERENTIAL/PLATELET
Basophil #: 0 10*3/uL (ref 0.0–0.1)
Basophil %: 0.2 %
EOS ABS: 0 10*3/uL (ref 0.0–0.7)
Eosinophil %: 0 %
HCT: 48.5 % (ref 40.0–52.0)
HGB: 16.3 g/dL (ref 13.0–18.0)
LYMPHS PCT: 12.1 %
Lymphocyte #: 2 10*3/uL (ref 1.0–3.6)
MCH: 30.1 pg (ref 26.0–34.0)
MCHC: 33.6 g/dL (ref 32.0–36.0)
MCV: 90 fL (ref 80–100)
MONO ABS: 0.8 x10 3/mm (ref 0.2–1.0)
Monocyte %: 4.9 %
NEUTROS PCT: 82.8 %
Neutrophil #: 13.9 10*3/uL — ABNORMAL HIGH (ref 1.4–6.5)
PLATELETS: 314 10*3/uL (ref 150–440)
RBC: 5.42 10*6/uL (ref 4.40–5.90)
RDW: 12.7 % (ref 11.5–14.5)
WBC: 16.7 10*3/uL — ABNORMAL HIGH (ref 3.8–10.6)

## 2013-09-09 LAB — COMPREHENSIVE METABOLIC PANEL
ALK PHOS: 111 U/L
ALT: 77 U/L — AB
AST: 53 U/L — AB (ref 15–37)
Albumin: 4.6 g/dL (ref 3.4–5.0)
Anion Gap: 15 (ref 7–16)
BILIRUBIN TOTAL: 1.3 mg/dL — AB (ref 0.2–1.0)
BUN: 19 mg/dL — ABNORMAL HIGH (ref 7–18)
CALCIUM: 9.7 mg/dL (ref 8.5–10.1)
CO2: 21 mmol/L (ref 21–32)
CREATININE: 1.5 mg/dL — AB (ref 0.60–1.30)
Chloride: 102 mmol/L (ref 98–107)
EGFR (African American): >60
GFR CALC NON AF AMER: 59 — AB
GLUCOSE: 142 mg/dL — AB (ref 65–99)
OSMOLALITY: 280 (ref 275–301)
POTASSIUM: 3.2 mmol/L — AB (ref 3.5–5.1)
Sodium: 138 mmol/L (ref 136–145)
Total Protein: 8.5 g/dL — ABNORMAL HIGH (ref 6.4–8.2)

## 2013-09-09 LAB — ETHANOL: Ethanol: <3 mg/dL

## 2013-09-09 LAB — SALICYLATE LEVEL: Salicylates, Serum: <1.7 mg/dL

## 2013-09-09 LAB — ACETAMINOPHEN LEVEL: Acetaminophen: <2 ug/mL

## 2013-09-09 LAB — TSH: Thyroid Stimulating Horm: 0.974 u[IU]/mL

## 2013-09-10 LAB — URINALYSIS, COMPLETE
BACTERIA: NONE SEEN
BLOOD: NEGATIVE
Bilirubin,UR: NEGATIVE
GLUCOSE, UR: NEGATIVE mg/dL (ref 0–75)
Ketone: NEGATIVE
Leukocyte Esterase: NEGATIVE
NITRITE: NEGATIVE
PH: 5 (ref 4.5–8.0)
Protein: NEGATIVE
SPECIFIC GRAVITY: 1.027 (ref 1.003–1.030)
Squamous Epithelial: NONE SEEN

## 2013-09-10 LAB — DRUG SCREEN, URINE
AMPHETAMINES, UR SCREEN: NEGATIVE (ref ?–1000)
Barbiturates, Ur Screen: NEGATIVE (ref ?–200)
Benzodiazepine, Ur Scrn: POSITIVE (ref ?–200)
COCAINE METABOLITE, UR ~~LOC~~: NEGATIVE (ref ?–300)
Cannabinoid 50 Ng, Ur ~~LOC~~: NEGATIVE (ref ?–50)
MDMA (ECSTASY) UR SCREEN: NEGATIVE (ref ?–500)
Methadone, Ur Screen: NEGATIVE (ref ?–300)
OPIATE, UR SCREEN: NEGATIVE (ref ?–300)
Phencyclidine (PCP) Ur S: NEGATIVE (ref ?–25)
Tricyclic, Ur Screen: NEGATIVE (ref ?–1000)

## 2013-09-10 LAB — BASIC METABOLIC PANEL
ANION GAP: 7 (ref 7–16)
BUN: 20 mg/dL — AB (ref 7–18)
CALCIUM: 8.4 mg/dL — AB (ref 8.5–10.1)
CHLORIDE: 107 mmol/L (ref 98–107)
CO2: 29 mmol/L (ref 21–32)
CREATININE: 1.21 mg/dL (ref 0.60–1.30)
EGFR (African American): >60
Glucose: 89 mg/dL (ref 65–99)
Osmolality: 287 (ref 275–301)
Potassium: 3.8 mmol/L (ref 3.5–5.1)
Sodium: 143 mmol/L (ref 136–145)

## 2013-09-11 LAB — CBC WITH DIFFERENTIAL/PLATELET
BASOS ABS: 0 10*3/uL (ref 0.0–0.1)
Basophil %: 0.5 %
Eosinophil #: 0.1 10*3/uL (ref 0.0–0.7)
Eosinophil %: 1 %
HCT: 39.2 % — ABNORMAL LOW (ref 40.0–52.0)
HGB: 13.1 g/dL (ref 13.0–18.0)
Lymphocyte #: 3.3 10*3/uL (ref 1.0–3.6)
Lymphocyte %: 42 %
MCH: 30.5 pg (ref 26.0–34.0)
MCHC: 33.3 g/dL (ref 32.0–36.0)
MCV: 92 fL (ref 80–100)
Monocyte #: 0.4 x10 3/mm (ref 0.2–1.0)
Monocyte %: 5.6 %
NEUTROS PCT: 50.9 %
Neutrophil #: 3.9 10*3/uL (ref 1.4–6.5)
PLATELETS: 171 10*3/uL (ref 150–440)
RBC: 4.28 10*6/uL — AB (ref 4.40–5.90)
RDW: 12.8 % (ref 11.5–14.5)
WBC: 7.8 10*3/uL (ref 3.8–10.6)

## 2013-09-11 LAB — BASIC METABOLIC PANEL
Anion Gap: 9 (ref 7–16)
BUN: 19 mg/dL — ABNORMAL HIGH (ref 7–18)
Calcium, Total: 8 mg/dL — ABNORMAL LOW (ref 8.5–10.1)
Chloride: 110 mmol/L — ABNORMAL HIGH (ref 98–107)
Co2: 26 mmol/L (ref 21–32)
Creatinine: 1.05 mg/dL (ref 0.60–1.30)
EGFR (African American): >60
GLUCOSE: 81 mg/dL (ref 65–99)
Osmolality: 290 (ref 275–301)
POTASSIUM: 3.6 mmol/L (ref 3.5–5.1)
SODIUM: 145 mmol/L (ref 136–145)

## 2013-09-11 LAB — URINE CULTURE

## 2013-09-11 LAB — MAGNESIUM: MAGNESIUM: 2.1 mg/dL

## 2013-09-12 ENCOUNTER — Inpatient Hospital Stay: Payer: Self-pay | Admitting: Psychiatry

## 2014-05-14 NOTE — H&P (Signed)
PATIENT NAME:  Jeffrey Lin, Darroll G MR#:  [id number] DATE OF BIRTH:  09/18/1975  DATE OF ADMISSION:  09/12/2013  INITIAL ASSESSMENT AND PSYCHIATRIC EVALUATION  IDENTIFYING INFORMATION: The patient is a 39 year old white male, employed, and works as a Pensions consultanttechnician at [employer]  and held a job for 10 years. The patient is single, never married and lives with his girlfriend, 38 years old, along with a baby who is a 763-month-old son and with her parents. All of them live together in a house. The patient comes to Emergency Room after his girlfriend noticed that he was tripping and falling for no reason. In addition, the patient reports that he has been hearing voices of various people that he knows but he cannot make out what they say and he has been seeing people around him, and these are people are some of them that he knows.  CHIEF COMPLAINT: I have been seeing visions of people and have been hearing voices, last Thursday, that is 09/09/2013, and was worried and then I was tripping and my girlfriend got concerned and brought me here.  HISTORY OF PRESENT ILLNESS: According to information obtained, the patient had been in CCU where he was intubated for overdose of benzodiazepines. He was slowly tapered off and at the time of transfer from CCU to the behavioral health floor he had been on Klonopin 1 mg 4 times a day. The patient reports that he had been prescribed Xanax 0.5 mg p.o. 5 times a day from Dr. Evelene CroonKaur who is his psychiatrist. The patient reports that he has been taking medication as prescribed and never took too much of the same.  PAST PSYCHIATRIC HISTORY: Inpatient hospitalization psychiatry to [facility] 2 years ago for a similar episode for about 4 days and he was inpatient for a similar episode when he was hearing voices and seeing people and he was on Xanax same dose. They just observed him and discharged him after a few days with a followup appointment. No history of suicide  attempts.  FAMILY HISTORY OF MENTAL ILLNESS: Unknown to patient. No known history of suicide in his family.  FAMILY HISTORY: Raised by parents. Father worked in [employer], father is living. Mother stays home, has 4 brothers, close family.  PERSONAL HISTORY: [place]; graduated from high school, has a [age]-year-old degree in Engineer, manufacturingarchitecture. Work history: Longest job was for 15 years, [employer], and he got bored of the job and so he quit after 15 years. Currently, employed for 10 years. Marital history: None. Marriage: He was never married. He has 1 baby who is 2 months old from his girlfriend and the baby son lives with them.  ALCOHOL AND DRUGS: Denied drinking alcohol. Denies street drug or prescription drug abuse. Denies smoking nicotine cigarettes.  MEDICAL HISTORY: No known H/O High B.P No known diabetes. No major surgeries. No major injuries. No history of motor vehicle accident or being unconscious.   ALLERGIES: No known allergies.  PRIMARY CARE PHYSICIAN: He is being followed by PCP and last saw him some time ago. He goes only as needed.  PHYSICAL EXAMINATION: VITAL SIGNS: Temperature is 98.4, pulse if 70 per minute, regular, respirations 20 per minute and regular, blood pressure 130/80 mmHg. HEENT: Normocephalic, atraumatic. Eyes: PERRLA. Fundi benign.  NECK: Supple without any organomegaly, lymphadenopathy. No thyromegaly. CHEST: Normal expansion. Normal breath sounds heard. HEART: Normal S1 and S2 heard without any murmurs. ABDOMEN: Soft, no organomegaly. Bowel sounds heard. RECTUM: Deferred. NEUROLOGICAL:  Gait is normal. Rhomberg is negative. Cranial nerves II-XII grossly intact and normal.  MENTAL STATUS EXAMINATION: The patient is dressed in street clothes. Alert and oriented to place, person and time. Aware of the situation that brought him here and stated that he had been hearing voices and seeing things which are not bothering him now,  but they did at the time of admission. Denies feeling depressed. Denies feeling hopeless or helpless.  Denies feeling worthless or useless. Denies any ideas or plans to hurt himself or others. Absolutely denies any suicidal ideas. No psychosis. Denies auditory or visual delusion, paranoid thinking. Memory is intact. Cognition intact. Could spell the word "world" forwards and backwards. Could count money. General knowledge information fair. Insight and judgment fair. Impulse control is poor and probably he is taking more medication than he is prescribed which he absolutely denies.  IMPRESSION:  AXIS I: Generalized anxiety disorder and overdose from prescription medications and the patient absolutely denies any suicide intention. Nicotine dependence, in remission. AXIS II: Deferred. AXIS III: None major. AXIS IV: Moderate long history of anxiety and having side effect problem from the anti-anxiety medications. AXIS V: Global assessment of functions 25.  PLAN: The patient was transferred from ICU to [facility] Unit for close observation. He will be started back on Klonopin 1 mg p.o. q.i.d. on which he was stabilized in ICU. During the stay in the hospital, will be given milieu therapy and supportive counseling where medications and the side effects may be discussed with him and benzodiazepines side effects will be focused. At the time of discharge, the patient was stabilized and appropriate followup appointment made in the community.   ____________________________ Jannet Mantis. Guss Bunde, MD skc:TT D: 09/12/2013 19:14:55 ET T: 09/12/2013 20:04:49 ET JOB#: [id number]  cc: Monika Salk K. Guss Bunde, MD, <Dictator> Beau Fanny MD ELECTRONICALLY SIGNED 09/18/2013 14:11

## 2014-05-14 NOTE — Discharge Summary (Signed)
PATIENT NAME:  Jeffrey Lin, Jeffrey Lin MR#:  [id number] DATE OF BIRTH:  [birth year]  DATE OF ADMISSION:  09/09/2013 DATE OF DISCHARGE:  09/12/2013  PRESENTING COMPLAINT: Agitation and confusion.   DISCHARGE DIAGNOSES:  1. Acute delirium suspected due to benzodiazepine withdrawal.  2. Chronic anxiety.   CONDITION ON DISCHARGE: Fair. Vitals stable.   MEDICATIONS:  1. Clonazepam 1 mg q.i.d. every 6 hours.  2. Gabapentin 300 mg 1 tablet every 4 times a day.   DISCHARGE DIET: Regular.   FOLLOWUP: Follow up with your primary care physician as instructed.  LABORATORY DATA: CBC within normal limits. Basic metabolic panel within normal limits, except chloride of 110. Magnesium 2.1. Urine drug screen positive for benzodiazepines. Urine culture negative in 18 to 24 hours.  CONSULTATION: Dr. Toni Amendlapacs, psychiatry.   HISTORY OF PRESENT ILLNESS: Jeffrey Lin is a 39 year old Caucasian gentleman with history of chronic anxiety came in with: 1. Acute encephalopathy with hallucinations and acute delirium. The patient was admitted in the intensive care unit. He was under involuntary commitment and had a sitter in the room. He was started on p.r.n. Haldol, IV Ativan as part of CIWA protocol. His alcohol levels were normal. Urine drug screen was positive for benzodiazepine use. It was reported that patient has apparently finished up his Xanax rather quickly and underwent benzodiazepine withdrawal without him taking his routine benzodiazepines. He was started on long-acting clonazepam by Dr. Toni Amendlapacs. He appears to be much calm and stable and hence ready for transfer to inpatient behavioral medicine.   2. Hypokalemia repeated.  3. Leukocytosis reactive. Patient afebrile. White count was down to 7.8.  4. Deep vein thrombosis prophylaxis. Subcutaneous heparin.  5. Blood pressure is stable. No medications required. The patient does not have history of hypertension. I spoke with Dr. Guss Bundehalla, who has accepted patient down to  the inpatient behavioral medicine today.   TIME SPENT: 40 minutes.    ____________________________ Wylie HailSona A. Allena KatzPatel, MD sap:lt D: 09/12/2013 14:00:35 ET T: 09/12/2013 21:46:21 ET JOB#: [id number]  cc: Akshith Moncus A. Allena KatzPatel, MD, <Dictator> Audery AmelJohn T. Clapacs, MD Jannet MantisSurya K. Guss Bundehalla, MD Willow OraSONA A Markanthony Gedney MD ELECTRONICALLY SIGNED 09/16/2013 12:31

## 2014-05-14 NOTE — Consult Note (Signed)
PATIENT NAME:  Jeffrey GillisSTEWART, Darcell G MR#:  [id number] DATE OF BIRTH:  30-Sep-1975  DATE OF CONSULTATION:  09/10/2013  REFERRING PHYSICIAN:   CONSULTING PHYSICIAN:  Audery AmelJohn T. Clapacs, MD  IDENTIFYING INFORMATION AND REASON FOR CONSULTATION: A [age] year old man with a history of with a history of anxiety disorder, admitted to the hospital with acute delirium. Consult for assistance with management of delirium and substance use.   HISTORY OF PRESENT ILLNESS: Information obtained briefly from the patient, also from the patient's mother today from the chart and other providers, and from a conversation with his regular psychiatrist today. The patient presented to the Emergency Room yesterday in a state of acute delirium. He was not able to give very much history. He was very confused and agitated, seemed to be having visual hallucinations. He was talking to people who were not there. He could answer questions in a rational matter only inconsistently and very briefly. He did indicate that he normally takes Alprazolam and that his dose is 1 mg 5 times a day, and that he had run out early this time. He denied that he had been abusing any other drugs. He denied any suicidal ideation. Mother gives some history today. She does not live with him currently and knows things only secondhand or by inference, but believes that he has been under increasing stress and anxiety recently. He had a child born not too long ago and apparently his living situation with his girlfriend's family has also been stressful in the past. She has reason to believe that he was taking his Xanax at a quicker rate than normal and ran out of it early. Dr. Evelene CroonKaur, who is his regular psychiatrist, confirmed for me today that she had been told that he ran out of the medicine about 10 days early. There is no indication that he was attempting to overdose or harm himself.   PAST PSYCHIATRIC HISTORY: He has a history of panic disorder and chronic anxiety. Mother says  that he has had extended periods of time in which he is stable, but he is very sensitive to stressful situations. Dr. Evelene CroonKaur has maintained him on Xanax 1 mg 5 times a day, imipramine 50 mg at night, and gabapentin 300 mg 4 times a day. The patient has had at least 1 psychiatric hospitalization in the past for similar circumstances, evidently of withdrawal from benzodiazepines after overusing it. No clear history of suicide attempts.   SOCIAL HISTORY: He is not married, lives with a long-standing girlfriend, has a child by her. Works at a [employer]. Reportedly likes his job most of the time. Somewhat estranged from some of his family of origin for unclear reasons.   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   FAMILY HISTORY: Positive for substance abuse.   CURRENT MEDICATIONS: As far as we know, gabapentin 300 mg 4 times a day, Xanax 1 mg 5 times a day, imipramine 50 mg at night.   ALLERGIES: No known drug allergies.   REVIEW OF SYSTEMS: The patient today is unable to give any history. Yesterday, he appeared to be agitated, but denied suicidal ideation. He was clearly having hallucinations. Not able to answer any questions today.   MENTAL STATUS EXAMINATION: Yesterday in the Emergency Room, the patient was awake, but only intermittently alert. Eye contact poor. Psychomotor  activity fidgety. Frequently seen to be picking imaginary things off his body. He was not aggressive or violent. Speech was low and quiet, and mumbling. Thoughts disorganized with evidence of hallucinations going on. Denied suicidal or homicidal ideation. Unable to participate in cognitive testing. Today, the patient was sound asleep having been given several doses of Ativan IV within the last few hours and was not arousable, but was hemodynamically  stable.   LABORATORY RESULTS: Urinalysis when finally provided, drug screen showed positive for benzodiazepines. By that time, presumably he had already been given and processed several doses in the Emergency Room. His chemistry panel today shows creatinine normalizing at 1.2, slightly elevated BUN still at 20. Yesterday, creatinine was elevated, potassium was low, ALT elevated at 77, AST elevated at 53. Alcohol level negative. TSH normal. CBC: Elevated white count 16.7, otherwise normal CBC.   ASSESSMENT: A 39 year old man with chronic anxiety disorder, currently having a delirium as a result of withdrawal from Xanax. No evidence of other cause for it. Currently, in the Critical Care Unit alternating between agitated, delirium, and sedation. Blood pressure is continuing to run very high with diastolics over 100. No report of any witnessed seizures. He is diaphoretic and continues to be agitated. I spoke with his outpatient psychiatrist, who feels that it will be appropriate long-term to continue him on standing benzodiazepines, but would like [place] to try switching him to a longer acting drug such as clonazepam. I think this is a reasonable plan.   TREATMENT PLAN: I have restarted his gabapentin. I am also going to write an order for 1 mg of clonazepam 4 times a day. This is in addition to the current orders for p.r.n. treatment of delirium. Hoping he will be able to take medicines by mouth so that we can get him back on t clonazepam. Hopefully, if we do this, we may be able to get him stabilized fairly quickly. At the current time, he is not able to walk or take care of himself, and is running very high blood pressures requiring IV fluid and IV medicine, and therefore is not manageable on the psychiatry ward. I suggest that we can continue to follow him over the weekend. If he becomes medically stable enough to be managed on the psychiatry ward and continues to require hospitalization, I would be happy to  transfer him at that point. I will not be here over the weekend. If a consultation from psychiatry is requested, please call Dr. Guss Bunde for reevaluation; otherwise, I will see the patient on Monday.   DIAGNOSIS PRINCIPAL AND PRIMARY:  AXIS I: Delirium due to benzodiazepine withdrawal.   SECONDARY DIAGNOSES: AXIS I: Panic disorder with agoraphobia, generalized anxiety disorder, social anxiety disorder.  AXIS II: No diagnosis.  AXIS III: No diagnosis.  AXIS IV: Moderate to severe as described above.  AXIS V: Functioning at time of evaluation is 25.     ____________________________ Audery Amel, MD jtc:at D: 09/10/2013 15:27:06 ET T: 09/10/2013 16:35:33 ET JOB#: [id number]  cc: Audery Amel, MD, <Dictator> Audery Amel MD ELECTRONICALLY SIGNED 10/01/2013 16:58

## 2014-05-14 NOTE — Consult Note (Signed)
Brief Consult Note: Diagnosis: benzodiazepine withdrawl.   Patient was seen by consultant.   Recommend further assessment or treatment.   Discussed with Attending MD.   Comments: Psychiatry: PAtient seen and second hand history reviewed. Patient has been on 5 x 1mg  xanax for at least months but recently ran out. Current symptoms consistant with xanax withdrawl. Unclear how much abuse problem he has. Will follow up  on medical when he is admitted.  Electronic Signatures: Audery Amellapacs, John T (MD)  (Signed 20-Aug-15 15:57)  Authored: Brief Consult Note   Last Updated: 20-Aug-15 15:57 by Audery Amellapacs, John T (MD)

## 2014-05-14 NOTE — H&P (Signed)
PATIENT NAME:  Jeffrey Lin, Jeffrey Lin MR#:  [id number] DATE OF BIRTH:  1975/12/17  DATE OF ADMISSION:  09/09/2013  REFERRING PHYSICIAN: Dr. Daryel November.  PRIMARY CARE PHYSICIAN: Nonlocal.   CHIEF COMPLAINT: Agitation and hallucination.   HISTORY OF PRESENT ILLNESS: This is a 39 year old male with known past medical history of substance abuse, who is on Xanax and gabapentin at home, who presents with  agitation and hallucination. He was delirious in the ED, agitated, requiring multiple doses of Ativan and Haldol. Apparently, the patient is on Xanax, which has been stopped recently after long-term use. The patient by the time of my exam he received multiple doses of sedation where he is currently sedated and cannot give any reliable review of systems or history. Was able to contact the patient's mother, who the last time seen him in May and reports he is currently living with his girlfriend who I was not able to get in touch with here on the available phone numbers. 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 Currently she is unaware of over the last three months, she has not seen him and is unable to provide any history on him since then. The patient was seen by psychiatric service in ED where he was thought to be having benzodiazepine withdrawal. The patient's CT head did not show any acute findings. As well, his blood work did not show any acute findings as well besides his leukocytosis of 16,000.   PAST MEDICAL HISTORY: None.   PAST SURGICAL HISTORY: Unable to obtain.   SOCIAL HISTORY: The patient lives at home with his girlfriend, history of occasional alcohol use. No history of drug use as per mother. No history of IV drug abuse but as mentioned, mom has not seen him for the last three months.   FAMILY HISTORY: No family history of coronary artery disease  at young age.   ALLERGIES: No drug allergies.   HOME MEDICATIONS:  Gabapentin 300 mg 2 capsules 3 times a day, alprazolam 1 mg oral 5 times a day but unclear at this point when was the last dose the patient had.  REVIEW OF SYSTEMS. Unable to obtain from patient as he is currently sedated.   PHYSICAL EXAMINATION: VITAL SIGNS: Temperature 99.2, pulse 82, respiratory rate 16, blood pressure 142/108, sating 97% on room air upon presentation.   GENERAL: Well-nourished male, sleeping. Does not appear in apparent distress.  HEENT: Head atraumatic, normocephalic. Pupils equal, reactive to light. Pink conjunctivae. Anicteric sclerae. Moist oral mucosa.  NECK: Supple. No thyromegaly. No JVD. No carotid bruits.  CHEST: Good air entry bilaterally. No wheezing, rales or rhonchi.  CARDIOVASCULAR: S1, S2 heard. No rubs, murmur or gallops.  ABDOMEN: Soft, nontender, nondistended. Bowel sounds present.  EXTREMITIES: No edema. No clubbing. No cyanosis. Pedal pulses felt bilaterally.  PSYCHIATRIC: The patient is sleeping comfortably. Unable to evaluate further.  NEUROLOGIC: Cranial nerves grossly intact. The patient responds to loud verbal stimuli, does open his eyes and go back to sleep. Moving all extremities without significant deficits.  SKIN: Normal skin turgor. Warm and dry.  MUSCULOSKELETAL: No joint effusion or erythema.   PERTINENT LABORATORY DATA: Glucose 142, BUN 19, creatinine 1.5, sodium 138, potassium 3.2, chloride 102, CO2 21, TSH 0.974, ALT 77, AST 53, alkaline phosphatase 111, white blood cell 16.7, hemoglobin 16.3, hematocrit 48.5, platelets 314,000, salicylate 1.7, acetaminophen  less than 2.   CT head: No acute intracranial abnormality.   ASSESSMENT AND PLAN:  1. Acute encephalopathy. The patient presents with hallucination and acute delirium, this is most likely related to benzodiazepine withdrawal. He will be admitted to stepdown unit. Will be kept on p.r.n. Haldol and IV Ativan as part  of CIWA protocol as it was unclear what was his alcohol consumption recently as well and with normal ethanol level, and if needed, we will start him on Precedex, be kept n.p.o. We will have a sitter for him as well. He is currently involuntarily committed by ED. We will check drug screen as well.  2. Leukocytosis. We will obtain urinalysis to rule out infectious process. This patient is febrile at this point.  3. Deep vein thrombosis prophylaxis. Subcutaneous heparin.   CODE STATUS: Full code.   TOTAL TIME SPENT ON ADMISSION AND PATIENT CARE: 50 minutes.    ____________________________ Starleen Armsawood S. Rondrick Barreira, MD dse:JT D: 09/10/2013 01:47:22 ET T: 09/10/2013 02:17:14 ET JOB#: [id number]  cc: Starleen Armsawood S. Shan Valdes, MD, <Dictator> William Laske Teena IraniS Khani Paino MD ELECTRONICALLY SIGNED 09/10/2013 5:05

## 2014-05-26 ENCOUNTER — Telehealth: Payer: Self-pay | Admitting: Family Medicine

## 2014-05-26 NOTE — Telephone Encounter (Signed)
PLEASE NOTE: All timestamps contained within this report are represented as [place]. CONFIDENTIALTY NOTICE: This fax transmission is intended only for the addressee. It contains information that is legally privileged, confidential or otherwise protected from use or disclosure. If you are not the intended recipient, you are strictly prohibited from reviewing, disclosing, copying using or disseminating any of this information or taking any action in reliance on or regarding this information. If you have received this fax in error, please notify us immediately by telephone so that we can arrange for its return to us. Phone: [phone], Toll-Free: [phone], Fax: [phone] Page: 1 of 2 Call Id: [id number] [facility] - Client TELEPHONE ADVICE RECORD [facility] Patient Name: Jeffrey Lin Gender: Male DOB: 06/18/1975 Age: [age] Y 11 M 29 D Return Phone Number: [phone] (Primary), [phone] (Secondary) Address: City/State/Zip: [place] Client [facility] Night - Client [facility] - Night Physician Ermalene SearingBedsole, [place] Contact Type Call Call Type Triage / Clinical Relationship To Patient Self Return Phone Number [phone] (Secondary) Chief Complaint Tick Bite Initial Comment Caller states he had a tick on his stomach - does he need to get an antibiotic - Caller states the secondary number is his work number - call that one and ask for his supervisor [name] and then it will be patched thru to him - he is not allowed to have his cell phone with him PreDisposition Call Doctor Nurse Assessment Nurse: [name], RN, [name] Date/Time (Eastern Time): 05/26/2014 8:31:07 AM Confirm and document reason for call. If symptomatic, describe symptoms. ---Caller states that he had an imbedded tick on his stomach this morning that was removed. No fever. No bleeding or drainage. Has the  patient traveled out of the country within the last 30 days? ---No Does the patient require triage? ---Yes Related visit to physician within the last 2 weeks? ---No Does the PT have any chronic conditions? (i.e. diabetes, asthma, etc.) ---Yes List chronic conditions. ---Anxiety, Panic Guidelines Guideline Title Affirmed Question Affirmed Notes Nurse Date/Time (Eastern Time) Tick Bite Tick bite with no complications (all triage questions negative) Trumbull, RN, [name] 05/26/2014 8:32:56 AM Disp. Time [name](Eastern Time) Disposition Final User 05/26/2014 8:36:40 AM Home Care Yes Trumbull, RN, Frann Riderathy Caller Understands: Yes Disagree/Comply: Comply PLEASE NOTE: All timestamps contained within this report are represented as [place] Standard Time. CONFIDENTIALTY NOTICE: This fax transmission is intended only for the addressee. It contains information that is legally privileged, confidential or otherwise protected from use or disclosure. If you are not the intended recipient, you are strictly prohibited from reviewing, disclosing, copying using or disseminating any of this information or taking any action in reliance on or regarding this information. If you have received this fax in error, please notify us immediately by telephone so that we can arrange for its return to us. Phone: [phone], Toll-Free: [phone], Fax: [phone] Page: 2 of 2 Call Id: [id number] Care Advice Given Per Guideline HOME CARE: You should be able to treat this at home. REASSURANCE: Most tick bites are harmless and can be treated at home. The spread of disease by ticks is rare. ANTIBIOTIC OINTMENT: Wash the wound and your hands with soap and water after removal to prevent catching any tick disease. Apply antibiotic ointment (OTC) to the bite once. EXPECTED COURSE: Tick bites normally don't itch or hurt. That's why they often go unnoticed. TETANUS BOOSTER: If last tetanus shot was given over 10 years ago, needs  a booster.  Call PCP during regular office hours (within 3 days) CALL BACK IF: * Fever or rash occur in the next 2 weeks * Bite begins to look infected * You become worse. CARE ADVICE given per Tick Bites (Adult) guideline. After Care Instructions Given Call Event Type User Date / Time Description

## 2014-05-26 NOTE — Telephone Encounter (Signed)
Patient Name: Jeffrey Lin DOB: 11/03/1975 Initial Comment Caller states he had a tick on his stomach - does he need to get an antibiotic - Caller states the secondary number is his work number - call that one and ask for his supervisor [name] and then it will be patched thru to him - he is not allowed to have his cell phone with him Nurse Assessment Nurse: [name], RN, [name] Date/Time (Eastern Time): 05/26/2014 8:31:07 AM Confirm and document reason for call. If symptomatic, describe symptoms. ---Caller states that he had an imbedded tick on his stomach this morning that was removed. No fever. No bleeding or drainage. Has the patient traveled out of the country within the last 30 days? ---No Does the patient require triage? ---Yes Related visit to physician within the last 2 weeks? ---No Does the PT have any chronic conditions? (i.e. diabetes, asthma, etc.) ---Yes List chronic conditions. ---Anxiety, Panic Guidelines Guideline Title Affirmed Question Affirmed Notes Tick Bite Tick bite with no complications (all triage questions negative) Final Disposition User Home Care Bystromrumbull, RN, [place]athy

## 2014-05-26 NOTE — Telephone Encounter (Signed)
Agree with home care. 

## 2014-07-14 ENCOUNTER — Emergency Department (HOSPITAL_COMMUNITY)
Admission: EM | Admit: 2014-07-14 | Discharge: 2014-07-14 | Disposition: A | Discharge status: Home / Self Care | Payer: BLUE CROSS/BLUE SHIELD | Attending: Emergency Medicine | Admitting: Emergency Medicine

## 2014-07-14 ENCOUNTER — Encounter (HOSPITAL_COMMUNITY): Payer: Self-pay | Admitting: Emergency Medicine

## 2014-07-14 ENCOUNTER — Emergency Department (HOSPITAL_COMMUNITY): Payer: BLUE CROSS/BLUE SHIELD

## 2014-07-14 DIAGNOSIS — Z792 Long term (current) use of antibiotics: Secondary | ICD-10-CM | POA: Insufficient documentation

## 2014-07-14 DIAGNOSIS — G8929 Other chronic pain: Secondary | ICD-10-CM | POA: Insufficient documentation

## 2014-07-14 DIAGNOSIS — Z87891 Personal history of nicotine dependence: Secondary | ICD-10-CM | POA: Diagnosis not present

## 2014-07-14 DIAGNOSIS — R079 Chest pain, unspecified: Secondary | ICD-10-CM | POA: Diagnosis present

## 2014-07-14 DIAGNOSIS — Z79899 Other long term (current) drug therapy: Secondary | ICD-10-CM | POA: Diagnosis not present

## 2014-07-14 DIAGNOSIS — F419 Anxiety disorder, unspecified: Secondary | ICD-10-CM | POA: Diagnosis not present

## 2014-07-14 LAB — CBC WITH DIFFERENTIAL/PLATELET
BASOS PCT: 0 % (ref 0–1)
Basophils Absolute: 0 10*3/uL (ref 0.0–0.1)
EOS ABS: 0.2 10*3/uL (ref 0.0–0.7)
Eosinophils Relative: 2 % (ref 0–5)
HEMATOCRIT: 43.8 % (ref 39.0–52.0)
HEMOGLOBIN: 15.3 g/dL (ref 13.0–17.0)
Lymphocytes Relative: 40 % (ref 12–46)
Lymphs Abs: 3.4 10*3/uL (ref 0.7–4.0)
MCH: 31.3 pg (ref 26.0–34.0)
MCHC: 34.9 g/dL (ref 30.0–36.0)
MCV: 89.6 fL (ref 78.0–100.0)
MONO ABS: 0.3 10*3/uL (ref 0.1–1.0)
MONOS PCT: 4 % (ref 3–12)
NEUTROS ABS: 4.6 10*3/uL (ref 1.7–7.7)
Neutrophils Relative %: 54 % (ref 43–77)
Platelets: 196 10*3/uL (ref 150–400)
RBC: 4.89 MIL/uL (ref 4.22–5.81)
RDW: 13.1 % (ref 11.5–15.5)
WBC: 8.5 10*3/uL (ref 4.0–10.5)

## 2014-07-14 LAB — BASIC METABOLIC PANEL
ANION GAP: 4 — AB (ref 5–15)
BUN: 6 mg/dL (ref 6–20)
CHLORIDE: 104 mmol/L (ref 101–111)
CO2: 32 mmol/L (ref 22–32)
CREATININE: 1.09 mg/dL (ref 0.61–1.24)
Calcium: 9.4 mg/dL (ref 8.9–10.3)
GFR calc Af Amer: >60 mL/min (ref >60)
GFR calc non Af Amer: >60 mL/min (ref >60)
Glucose, Bld: 95 mg/dL (ref 65–99)
POTASSIUM: 4.2 mmol/L (ref 3.5–5.1)
Sodium: 140 mmol/L (ref 135–145)

## 2014-07-14 LAB — I-STAT TROPONIN, ED: TROPONIN I, POC: 0 ng/mL (ref 0.00–0.08)

## 2014-07-14 MED ORDER — OXYCODONE-ACETAMINOPHEN 5-325 MG PO TABS
1.0000 | ORAL_TABLET | Freq: Once | ORAL | Status: AC
  Administered 2014-07-14: 1 via ORAL
  Filled 2014-07-14: qty 1

## 2014-07-14 MED ORDER — HYDROCODONE-ACETAMINOPHEN 5-325 MG PO TABS: 1.0000 | ORAL_TABLET | Freq: Four times a day (QID) | ORAL | Status: DC | PRN

## 2014-07-14 MED ORDER — IOHEXOL 300 MG/ML  SOLN
75.0000 mL | Freq: Once | INTRAMUSCULAR | Status: AC | PRN
  Administered 2014-07-14: 75 mL via INTRAVENOUS

## 2014-07-14 NOTE — Discharge Instructions (Signed)
Take the prescribed medication as directed. °Follow-up with your primary care physician. °Return to the ED for new or worsening symptoms. ° °

## 2014-07-14 NOTE — ED Notes (Signed)
Arrives ems from home with chest pain that began after a deep cough. Nonradiating. Increases with movement, palpation, coughing. Current smoker. 7/10 constant. 324mg  ASA.

## 2014-07-14 NOTE — ED Provider Notes (Signed)
CSN: [id number]     Arrival date & time 07/14/14  1147 History   First MD Initiated Contact with Patient 07/14/14 1154     Chief Complaint  Patient presents with  . Chest Pain     (Consider location/radiation/quality/duration/timing/severity/associated sxs/prior Treatment) Patient is a 39 y.o. male presenting with chest pain. The history is provided by the patient and medical records.  Chest Pain  This is a 39 year old male with past medical history significant for chronic leg pain and anxiety, presenting to the ED for chest pain. Patient states he was at work earlier today began coughing, then developed left-sided chest pain. Pain described as aching with intermittent sharp, stabbing pains. He denies any shortness of breath, dizziness, diaphoresis, nausea, vomiting, or weakness. Pain worse with deep breathing, coughing, and movement. states similar episode to this a few months ago, was told by PCP it was muscular pain.  Patient has no known cardiac history. His great-grandmother did have a heart attack. Patient is a daily smoker, approximately 1-1-1/2 packs per day for the past 20 years.  No recent fever, chills, or other illness.  Patent was given ASA by nurse at work PTA.  Past Medical History  Diagnosis Date  . Chronic leg pain   . Anxiety    Past Surgical History  Procedure Laterality Date  . 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 Bone graft from hip to jaw  2004  . Bone graft right side of head to jaw  2006  . Sphenopalatine ganglionic    . Block procedure at pain center  03/27/06   Family History  Problem Relation Age of Onset  . Hyperlipidemia Mother   . Hypertension Mother    History  Substance Use Topics  . Smoking status: Former Smoker -- 0.80 packs/day for 10 years    Types: Cigarettes  . Smokeless tobacco: Former Neurosurgeon  . Alcohol Use: No    Review of Systems  Cardiovascular: Positive for chest pain.  All other systems reviewed and are  negative.     Allergies  Review of patient's allergies indicates no known allergies.  Home Medications   Prior to Admission medications   Medication Sig Start Date End Date Taking? Authorizing Provider  ALPRAZolam Prudy Feeler) 1 MG tablet Take 1 mg by mouth. 5 times a day    Historical Provider, MD  amoxicillin (AMOXIL) 500 MG capsule Take 500 mg by mouth every 8 (eight) hours.    Historical Provider, MD  cyclobenzaprine (FLEXERIL) 10 MG tablet Take 1 tablet (10 mg total) by mouth at bedtime as needed for muscle spasms. 05/21/13   Amy Michelle Nasuti, MD  diclofenac (VOLTAREN) 75 MG EC tablet Take 1 tablet (75 mg total) by mouth 2 (two) times daily. 05/21/13   Amy Michelle Nasuti, MD  gabapentin (NEURONTIN) 300 MG capsule Take 3 capsules (900 mg total) by mouth 3 (three) times daily. 07/22/13   Amy E Bedsole, MD   BP 131/86 mmHg  Pulse 74  Temp(Src) 98.2 F (36.8 C) (Oral)  Resp 15  SpO2 96%   Physical Exam  Constitutional: He is oriented to person, place, and time. He appears well-developed and well-nourished.  HENT:  Head: Normocephalic and atraumatic.  Mouth/Throat: Oropharynx is clear and moist.  Eyes: Conjunctivae and EOM are normal. Pupils are equal, round, and reactive to light.  Neck: Normal range of motion.  Cardiovascular: Normal rate, regular rhythm and normal heart sounds.   Pulmonary/Chest: Effort normal and breath sounds normal.  No respiratory distress. He has no wheezes. He exhibits tenderness.    Left chest wall TTP as depicted; no bony deformities, crepitus, or flail segment; lungs clear bilaterally  Abdominal: Soft. Bowel sounds are normal.  Musculoskeletal: Normal range of motion.  Neurological: He is alert and oriented to person, place, and time.  Skin: Skin is warm and dry.  Psychiatric: He has a normal mood and affect.  Nursing note and vitals reviewed.   ED Course  Procedures (including critical care time) Labs Review Labs Reviewed  BASIC METABOLIC PANEL - Abnormal;  Notable for the following:    Anion gap 4 (*)    All other components within normal limits  CBC WITH DIFFERENTIAL/PLATELET  Rosezena Sensor, ED    Imaging Review Dg Chest 2 View  07/14/2014   CLINICAL DATA:  Left-sided chest pain and pressure.  EXAM: CHEST - 2 VIEW  COMPARISON:  None.  FINDINGS: The heart psi at scratch the the heart size is normal. Linear airspace opacities are noted at the left base. Mild perihilar bronchitic changes are noted bilaterally. A nodular density projects over the anterior first rib on the right. This is not clearly seen on the lateral view. The visualized soft tissues and bony thorax are unremarkable.  IMPRESSION: 1. Linear opacities at the left base likely represent scarring or atelectasis. Acute infection is also considered. 2. Perihilar bronchitic changes appear chronic. 3. Nodular densities in the right upper lobe. These may be related to scarring. CT of the chest with contrast is recommended for further evaluation.   Electronically Signed   By: Marin Roberts M.D.   On: 07/14/2014 13:18   Ct Chest W Contrast  07/14/2014   CLINICAL DATA:  LEFT chest pain, abnormal chest radiograph with question lung nodule  EXAM: CT CHEST WITH CONTRAST  TECHNIQUE: Multidetector CT imaging of the chest was performed during intravenous contrast administration. Sagittal and coronal MPR images reconstructed from axial data set.  CONTRAST:  75mL OMNIPAQUE IOHEXOL 300 MG/ML  SOLN IV  COMPARISON:  None ; correlation chest radiograph 07/14/2014  FINDINGS: Thoracic vascular structures patent on nondedicated exam.  Visualized upper abdomen normal appearance, splenule incidentally noted at splenic hilum.  No thoracic adenopathy.  Dependent atelectasis in the lower lobes bilaterally.  No pulmonary infiltrate, pleural effusion or pneumothorax.  Blebs at LEFT lung base with minimal adjacent scarring.  No discrete pulmonary mass or nodule identified.  Prominent RIGHT first costochondral  junction, likely accounting for a portion of the radiographic abnormality.  Bones otherwise unremarkable.  IMPRESSION: Blebs and minimal scarring at LEFT lower lobe.  No acute intra thoracic abnormalities.   Electronically Signed   By: Ulyses Southward M.D.   On: 07/14/2014 15:08     EKG Interpretation None      MDM   Final diagnoses:  Chest pain   39 year old male here for left-sided chest pain after a bout of heavy coughing at work today. Pain is constant, aching without radiation. No associated symptoms. Patient afebrile, nontoxic. His pain is reproducible with palpation to left chest wall. There are no acute bony deformities.  EKG sinus rhythm without acute ischemic changes. Labwork is reassuring, troponin negative. Chest x-ray with questionable scarring of the right upper lobe. CT chest was recommended and obtained-- blebs and scarring noted without acute mass or signs of infection.  Patient was given percocet in the ED with improvement of his pain. Pain is atypical and given reproducible nature feel that this is most likely musculoskeletal. Lower suspicion  for ACS, PE, dissection, or other acute cardiac event at this time. Patient will be discharged home with supportive care. I've encouraged him to follow-up with his PCP.  Discussed plan with patient, he/she acknowledged understanding and agreed with plan of care.  Return precautions given for new or worsening symptoms.  Garlon Hatchet, PA-C 07/14/14 1609  Tilden Fossa, MD 07/14/14 [phone]

## 2014-07-17 ENCOUNTER — Emergency Department (HOSPITAL_COMMUNITY)
Admission: EM | Admit: 2014-07-17 | Discharge: 2014-07-17 | Disposition: A | Discharge status: Home / Self Care | Payer: BLUE CROSS/BLUE SHIELD | Attending: Emergency Medicine | Admitting: Emergency Medicine

## 2014-07-17 ENCOUNTER — Encounter (HOSPITAL_COMMUNITY): Payer: Self-pay | Admitting: Adult Health

## 2014-07-17 DIAGNOSIS — R0789 Other chest pain: Secondary | ICD-10-CM | POA: Diagnosis not present

## 2014-07-17 DIAGNOSIS — F419 Anxiety disorder, unspecified: Secondary | ICD-10-CM | POA: Diagnosis not present

## 2014-07-17 DIAGNOSIS — Z87891 Personal history of nicotine dependence: Secondary | ICD-10-CM | POA: Diagnosis not present

## 2014-07-17 DIAGNOSIS — G8929 Other chronic pain: Secondary | ICD-10-CM | POA: Insufficient documentation

## 2014-07-17 DIAGNOSIS — R0602 Shortness of breath: Secondary | ICD-10-CM | POA: Diagnosis not present

## 2014-07-17 DIAGNOSIS — Z79899 Other long term (current) drug therapy: Secondary | ICD-10-CM | POA: Insufficient documentation

## 2014-07-17 DIAGNOSIS — R079 Chest pain, unspecified: Secondary | ICD-10-CM | POA: Diagnosis present

## 2014-07-17 LAB — CBC
HEMATOCRIT: 45.3 % (ref 39.0–52.0)
Hemoglobin: 15.7 g/dL (ref 13.0–17.0)
MCH: 31.2 pg (ref 26.0–34.0)
MCHC: 34.7 g/dL (ref 30.0–36.0)
MCV: 90.1 fL (ref 78.0–100.0)
Platelets: 195 10*3/uL (ref 150–400)
RBC: 5.03 MIL/uL (ref 4.22–5.81)
RDW: 13 % (ref 11.5–15.5)
WBC: 11.8 10*3/uL — ABNORMAL HIGH (ref 4.0–10.5)

## 2014-07-17 LAB — BASIC METABOLIC PANEL
ANION GAP: 8 (ref 5–15)
BUN: 7 mg/dL (ref 6–20)
CALCIUM: 9.1 mg/dL (ref 8.9–10.3)
CHLORIDE: 104 mmol/L (ref 101–111)
CO2: 27 mmol/L (ref 22–32)
Creatinine, Ser: 1.02 mg/dL (ref 0.61–1.24)
GFR calc Af Amer: >60 mL/min (ref >60)
GFR calc non Af Amer: >60 mL/min (ref >60)
Glucose, Bld: 86 mg/dL (ref 65–99)
POTASSIUM: 3.7 mmol/L (ref 3.5–5.1)
SODIUM: 139 mmol/L (ref 135–145)

## 2014-07-17 LAB — I-STAT TROPONIN, ED: TROPONIN I, POC: 0 ng/mL (ref 0.00–0.08)

## 2014-07-17 MED ORDER — NAPROXEN 500 MG PO TABS: 500.0000 mg | ORAL_TABLET | Freq: Two times a day (BID) | ORAL | Status: DC

## 2014-07-17 NOTE — ED Notes (Signed)
Presents with sternal chest pain began Thursday described as stabbing and heaviness-he was seen here at that time-he also reports that he feels SOB and it has progressed to inability to stand up at work with feeling SOB -breath sounds clear in lower fields. Pain is associated with blurry vision. Sats 96% RA.

## 2014-07-17 NOTE — ED Provider Notes (Signed)
CSN: [id number]     Arrival date & time 07/17/14  9628 History   First MD Initiated Contact with Patient 07/17/14 [phone]     Chief Complaint  Patient presents with  . Shortness of Breath  . Chest Pain     (Consider location/radiation/quality/duration/timing/severity/associated sxs/prior Treatment) HPI Jeffrey Lin is a 39 y.o. male visit for evaluation of chest discomfort and shortness of breath. Patient was seen in the ED on 6/23 for the same symptoms, was told that he had some scarring in his chest. Patient reports this scarring is now beginning to hurt. He reports this chest discomfort as intermittent pressure with also intermittent sharp stabbing pains that radiate from the left to the right. He reports he has also seen his PCP for this discomfort and was told it is a muscle strain. It is worse with palpation and certain movements are better with sitting down and even better lying flat. Patient has not taken anything to improve his symptoms. Denies any fevers, chills, cough, headache, vision changes, nausea or vomiting, diaphoresis, abdominal pain. Patient does admit to smoking 1-1-1/2 packs per day of cigarettes.  Past Medical History  Diagnosis Date  . Chronic leg pain   . Anxiety    Past Surgical History  Procedure Laterality Date  . 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 Bone graft from hip to jaw  2004  . Bone graft right side of head to jaw  2006  . Sphenopalatine ganglionic    . Block procedure at pain center  03/27/06   Family History  Problem Relation Age of Onset  . Hyperlipidemia Mother   . Hypertension Mother    History  Substance Use Topics  . Smoking status: Former Smoker -- 0.80 packs/day for 10 years    Types: Cigarettes  . Smokeless tobacco: Former Neurosurgeon  . Alcohol Use: No    Review of Systems A 10 point review of systems was completed and was negative except for pertinent positives and negatives as mentioned in the history of present  illness     Allergies  Review of patient's allergies indicates no known allergies.  Home Medications   Prior to Admission medications   Medication Sig Start Date End Date Taking? Authorizing Provider  clonazePAM (KLONOPIN) 1 MG tablet Take 1 mg by mouth 3 (three) times daily as needed. 07/07/14  Yes Historical Provider, MD  gabapentin (NEURONTIN) 600 MG tablet Take 1,200-1,800 mg by mouth See admin instructions. Take either two tablets (1200mg ) three times a day or three tablets (1800mg ) two times a day.   Yes Historical Provider, MD  cyclobenzaprine (FLEXERIL) 10 MG tablet Take 1 tablet (10 mg total) by mouth at bedtime as needed for muscle spasms. Patient not taking: Reported on 07/14/2014 05/21/13   Excell Seltzer, MD  diclofenac (VOLTAREN) 75 MG EC tablet Take 1 tablet (75 mg total) by mouth 2 (two) times daily. Patient not taking: Reported on 07/14/2014 05/21/13   Excell Seltzer, MD  HYDROcodone-acetaminophen (NORCO/VICODIN) 5-325 MG per tablet Take 1 tablet by mouth every 6 (six) hours as needed. 07/14/14   Garlon Hatchet, PA-C  naproxen (NAPROSYN) 500 MG tablet Take 1 tablet (500 mg total) by mouth 2 (two) times daily. 07/17/14   Joycie Peek, PA-C   BP 137/94 mmHg  Pulse 65  Temp(Src) 97.8 F (36.6 C) (Oral)  Resp 15  Ht 6\' 6"  (1.981 m)  Wt 214 lb (97.07 kg)  BMI 24.74 kg/m2  SpO2  98% Physical Exam  Constitutional: He is oriented to person, place, and time. He appears well-developed and well-nourished.  HENT:  Head: Normocephalic and atraumatic.  Mouth/Throat: Oropharynx is clear and moist.  Eyes: Conjunctivae are normal. Pupils are equal, round, and reactive to light. Right eye exhibits no discharge. Left eye exhibits no discharge. No scleral icterus.  Neck: Neck supple.  Cardiovascular: Normal rate, regular rhythm and normal heart sounds.   Pulmonary/Chest: Effort normal and breath sounds normal. No respiratory distress. He has no wheezes. He has no rales.    Tenderness to  palpation over anterior aspect of the left pectoral muscle. Palpation reproduces pain exactly. Radiates into right chest.  Abdominal: Soft. There is no tenderness.  Musculoskeletal: He exhibits no tenderness.  Neurological: He is alert and oriented to person, place, and time.  Cranial Nerves II-XII grossly intact  Skin: Skin is warm and dry. No rash noted.  Psychiatric: He has a normal mood and affect.  Nursing note and vitals reviewed.   ED Course  Procedures (including critical care time) Labs Review Labs Reviewed  CBC - Abnormal; Notable for the following:    WBC 11.8 (*)    All other components within normal limits  BASIC METABOLIC PANEL  I-STAT TROPOININ, ED    Imaging Review No results found.   EKG Interpretation None     Meds given in ED:  Medications - No data to display  New Prescriptions   NAPROXEN (NAPROSYN) 500 MG TABLET    Take 1 tablet (500 mg total) by mouth 2 (two) times daily.   Filed Vitals:   07/17/14 0938 07/17/14 1100  BP: 128/85 137/94  Pulse: 83 65  Temp: 96 F (35.6 C) 97.8 F (36.6 C)  TempSrc: Oral Oral  Resp: 16 15  Height:  (1.981 m)   Weight: 214 lb (97.07 kg)   SpO2: 96% 98%    MDM  Vitals stable - WNL -afebrile, oxygen saturation is 98% on room air. Pt resting comfortably in ED. PE--normal lung exam and chest discomfort is reproduced exactly with palpation of left inferior pectoralis muscle. Labwork--labs are noncontributory with nonspecific leukocytosis 11.8. EKG is not concerning for any ischemic change. Imaging--patient recently had CT chest completed on 6/23 but did not show any acute or emergent pathology.  DDX--patient with chest discomfort that is reproduced on examination. I feel strongly that this is musculoskeletal pain that is causing his discomfort. Doubt cardiac or pulmonic etiology. Patient has not tried any anti-inflammatory for this problem. Will DC with naproxen. Discussed smoking cessation  I have  personally reviewed CT imaging and agree with radiologist. I discussed all relevant lab findings and imaging results with pt and they verbalized understanding. Discussed f/u with PCP within 48 hrs and return precautions, pt very amenable to plan.  Final diagnoses:  Chest wall discomfort      Joycie Peek, PA-C 07/17/14 1127  Eber Hong, MD 07/17/14 [phone]

## 2014-07-17 NOTE — Discharge Instructions (Signed)
You were evaluated in the ED today for your chest discomfort. You are likely suffering from chest wall pain. Your exam and labs today were very reassuring. You had a CT scan done of your chest on 6/23 that showed no emergent problems. It is important for you to take anti-inflammatory's for your discomfort to help with your symptoms. Please follow-up with primary care for further by which and management of your symptoms. Return to ED for worsening symptoms.  Chest Wall Pain Chest wall pain is pain felt in or around the chest bones and muscles. It may take up to 6 weeks to get better. It may take longer if you are active. Chest wall pain can happen on its own. Other times, things like germs, injury, coughing, or exercise can cause the pain. HOME CARE   Avoid activities that make you tired or cause pain. Try not to use your chest, belly (abdominal), or side muscles. Do not use heavy weights.  Put ice on the sore area.  Put ice in a plastic bag.  Place a towel between your skin and the bag.  Leave the ice on for 15-20 minutes for the first 2 days.  Only take medicine as told by your doctor. GET HELP RIGHT AWAY IF:   You have more pain or are very uncomfortable.  You have a fever.  Your chest pain gets worse.  You have new problems.  You feel sick to your stomach (nauseous) or throw up (vomit).  You start to sweat or feel lightheaded.  You have a cough with mucus (phlegm).  You cough up blood. MAKE SURE YOU:   Understand these instructions.  Will watch your condition.  Will get help right away if you are not doing well or get worse. Document Released: 06/26/2007 Document Revised: 04/01/2011 Document Reviewed: 09/03/2010 [facility] Patient Information 2015 [place], [place]. This information is not intended to replace advice given to you by your health care provider. Make sure you discuss any questions you have with your health care provider.

## 2014-07-19 ENCOUNTER — Encounter: Payer: Self-pay | Admitting: Family Medicine

## 2014-07-19 ENCOUNTER — Ambulatory Visit (INDEPENDENT_AMBULATORY_CARE_PROVIDER_SITE_OTHER): Payer: BLUE CROSS/BLUE SHIELD | Admitting: Family Medicine

## 2014-07-19 VITALS — BP 122/80 | HR 84 | Temp 98.1°F | Ht 78.0 in | Wt 216.5 lb

## 2014-07-19 DIAGNOSIS — R0609 Other forms of dyspnea: Secondary | ICD-10-CM | POA: Insufficient documentation

## 2014-07-19 DIAGNOSIS — R0602 Shortness of breath: Secondary | ICD-10-CM | POA: Diagnosis not present

## 2014-07-19 DIAGNOSIS — R0789 Other chest pain: Secondary | ICD-10-CM | POA: Insufficient documentation

## 2014-07-19 DIAGNOSIS — M546 Pain in thoracic spine: Secondary | ICD-10-CM

## 2014-07-19 MED ORDER — CYCLOBENZAPRINE HCL 10 MG PO TABS: 10.0000 mg | ORAL_TABLET | Freq: Every evening | ORAL | Status: DC | PRN

## 2014-07-19 NOTE — Progress Notes (Signed)
Pre visit review using our clinic review tool, if applicable. No additional management support is needed unless otherwise documented below in the visit note. 

## 2014-07-19 NOTE — Patient Instructions (Addendum)
Quit smoking. Set a quit date. Call if you want to consider any other method of quitting smoking.

## 2014-07-19 NOTE — Assessment & Plan Note (Signed)
Treat with chest wall stretching and NSAIDs.

## 2014-07-19 NOTE — Assessment & Plan Note (Signed)
Can use muscle relaxant prn.

## 2014-07-19 NOTE — Assessment & Plan Note (Signed)
Spirometry shows mild restriction, no sign of COPD changes although CT suggests scarring.  Recommended quitting smoking.

## 2014-07-19 NOTE — Progress Notes (Signed)
   Subjective:    Patient ID: Jeffrey Lin, male    DOB: 05/20/1975, 39 y.o.   MRN: [id number]  HPI  39 year old male presents for ER follow up of shortness of breath and chest pain. Seen in ER on 07/14/2014 and 07/17/2014 Intermittent pressure with also intermittent sharp stabbing pains that radiate from the left to the right.  CXR 07/14/2014: IMPRESSION: 1. Linear opacities at the left base likely represent scarring or atelectasis. Acute infection is also considered. 2. Perihilar bronchitic changes appear chronic. 3. Nodular densities in the right upper lobe. These may be related to scarring. CT of the chest with contrast is recommended for further evaluation.  Chest CT 07/14/2014: IMPRESSION: no pulmonary infiltrate Blebs and minimal scarring at LEFT lower lobe. No acute intra thoracic abnormalities  6/23 and 6/26 labs nml BMET, cbc and troponin EKG nml  Told  chest pain most likely due to  a muscle strain. Treat with NSAIDs: naproxen  Pt is an every day smoker, 20 pack history. Quit for 6 months in past with vaping. Restarted when moved back with mother. Chantix was too expensive.  Today he reports naproxen has helped with chest pain. Never filled norco that he was given. He has a little wheeze at times a when lying down x for months.  Minimal shortness of breath with walking.  Family history : MGGF: COPD  Review of Systems  Constitutional: Negative for fever and fatigue.  HENT: Negative for ear pain.   Eyes: Negative for pain.  Respiratory: Positive for shortness of breath and wheezing. Negative for cough.   Cardiovascular: Positive for chest pain. Negative for palpitations and leg swelling.  Gastrointestinal: Negative for abdominal pain.  Genitourinary: Negative for dysuria.  Musculoskeletal: Negative for arthralgias.  Neurological: Negative for syncope, light-headedness and headaches.  Psychiatric/Behavioral: Negative for dysphoric mood.       Objective:   Physical Exam  Constitutional: Vital signs are normal. He appears well-developed and well-nourished.  HENT:  Head: Normocephalic.  Right Ear: Hearing normal.  Left Ear: Hearing normal.  Nose: Nose normal.  Mouth/Throat: Oropharynx is clear and moist and mucous membranes are normal.  Neck: Trachea normal. Carotid bruit is not present. No thyroid mass and no thyromegaly present.  Cardiovascular: Normal rate, regular rhythm and normal pulses.  Exam reveals no gallop, no distant heart sounds and no friction rub.   No murmur heard. No peripheral edema  Pulmonary/Chest: Effort normal and breath sounds normal. No respiratory distress.  Skin: Skin is warm, dry and intact. No rash noted.  Psychiatric: He has a normal mood and affect. His speech is normal and behavior is normal. Thought content normal.          Assessment & Plan:

## 2014-07-26 ENCOUNTER — Ambulatory Visit (INDEPENDENT_AMBULATORY_CARE_PROVIDER_SITE_OTHER): Payer: BLUE CROSS/BLUE SHIELD | Admitting: Family Medicine

## 2014-07-26 ENCOUNTER — Emergency Department (HOSPITAL_COMMUNITY)
Admission: EM | Admit: 2014-07-26 | Discharge: 2014-07-26 | Disposition: A | Discharge status: Home / Self Care | Payer: BLUE CROSS/BLUE SHIELD | Attending: Emergency Medicine | Admitting: Emergency Medicine

## 2014-07-26 ENCOUNTER — Encounter (HOSPITAL_COMMUNITY): Payer: Self-pay | Admitting: Emergency Medicine

## 2014-07-26 ENCOUNTER — Encounter: Payer: Self-pay | Admitting: Family Medicine

## 2014-07-26 ENCOUNTER — Emergency Department (HOSPITAL_COMMUNITY): Payer: BLUE CROSS/BLUE SHIELD

## 2014-07-26 VITALS — BP 128/84 | HR 79 | Temp 97.7°F | Wt 215.8 lb

## 2014-07-26 DIAGNOSIS — R110 Nausea: Secondary | ICD-10-CM | POA: Diagnosis not present

## 2014-07-26 DIAGNOSIS — R0602 Shortness of breath: Secondary | ICD-10-CM | POA: Diagnosis not present

## 2014-07-26 DIAGNOSIS — Z720 Tobacco use: Secondary | ICD-10-CM | POA: Insufficient documentation

## 2014-07-26 DIAGNOSIS — G8929 Other chronic pain: Secondary | ICD-10-CM | POA: Insufficient documentation

## 2014-07-26 DIAGNOSIS — F419 Anxiety disorder, unspecified: Secondary | ICD-10-CM | POA: Insufficient documentation

## 2014-07-26 DIAGNOSIS — R079 Chest pain, unspecified: Secondary | ICD-10-CM | POA: Diagnosis present

## 2014-07-26 DIAGNOSIS — R0789 Other chest pain: Secondary | ICD-10-CM | POA: Diagnosis not present

## 2014-07-26 DIAGNOSIS — Z79899 Other long term (current) drug therapy: Secondary | ICD-10-CM | POA: Insufficient documentation

## 2014-07-26 LAB — CBC
HEMATOCRIT: 49 % (ref 39.0–52.0)
HEMOGLOBIN: 16.8 g/dL (ref 13.0–17.0)
MCH: 31.7 pg (ref 26.0–34.0)
MCHC: 34.3 g/dL (ref 30.0–36.0)
MCV: 92.5 fL (ref 78.0–100.0)
Platelets: 219 10*3/uL (ref 150–400)
RBC: 5.3 MIL/uL (ref 4.22–5.81)
RDW: 13.3 % (ref 11.5–15.5)
WBC: 11.3 10*3/uL — AB (ref 4.0–10.5)

## 2014-07-26 LAB — BASIC METABOLIC PANEL
Anion gap: 8 (ref 5–15)
BUN: 6 mg/dL (ref 6–20)
CO2: 31 mmol/L (ref 22–32)
CREATININE: 1.11 mg/dL (ref 0.61–1.24)
Calcium: 9.1 mg/dL (ref 8.9–10.3)
Chloride: 101 mmol/L (ref 101–111)
GFR calc Af Amer: >60 mL/min (ref >60)
GFR calc non Af Amer: >60 mL/min (ref >60)
Glucose, Bld: 110 mg/dL — ABNORMAL HIGH (ref 65–99)
POTASSIUM: 3.9 mmol/L (ref 3.5–5.1)
Sodium: 140 mmol/L (ref 135–145)

## 2014-07-26 LAB — I-STAT TROPONIN, ED: TROPONIN I, POC: 0 ng/mL (ref 0.00–0.08)

## 2014-07-26 LAB — BRAIN NATRIURETIC PEPTIDE: B Natriuretic Peptide: 20.2 pg/mL (ref 0.0–100.0)

## 2014-07-26 MED ORDER — KETOROLAC TROMETHAMINE 60 MG/2ML IM SOLN
60.0000 mg | Freq: Once | INTRAMUSCULAR | Status: AC
  Administered 2014-07-26: 60 mg via INTRAMUSCULAR
  Filled 2014-07-26: qty 2

## 2014-07-26 MED ORDER — PREDNISONE 20 MG PO TABS: ORAL_TABLET | ORAL | Status: DC

## 2014-07-26 NOTE — ED Notes (Signed)
MD at bedside. 

## 2014-07-26 NOTE — Progress Notes (Signed)
Pre visit review using our clinic review tool, if applicable. No additional management support is needed unless otherwise documented below in the visit note.  L sided chest pain.   Prev notes and imaging d/w pt, along with ER records.  Images d/w pt, in front of patient.  Still with L sided CP that isn't exertional.  Pain at rest, or with movement.  Runs around the L side of the chest.  No rash.  No R sided pain.  No trauma to explain the sx.  Not SOB.  nsaids help a little.   Meds, vitals, and allergies reviewed.   ROS: See HPI.  Otherwise, noncontributory.  nad but uncomfortable.  Mmm Neck supple.  rrr ctab abd soft, not ttp Change in sensation noted on the L chest wall, dermatomal.   No rash.

## 2014-07-26 NOTE — ED Notes (Signed)
Pt. reports intermittent left chest pain with SOB and nausea onset 2 weeks ago . Denies diaphoresis or cough .

## 2014-07-26 NOTE — ED Provider Notes (Signed)
CSN: [id number]     Arrival date & time 07/26/14  0006 History   This chart was scribed for Shon Baton, MD by Arlan Organ, ED Scribe. This patient was seen in room D33C/D33C and the patient's care was started 12:18 AM.    Chief Complaint  Patient presents with  . Chest Pain   The history is provided by the patient. No language interpreter was used.    HPI Comments: Jeffrey Lin is a 39 y.o. male without any pertinent past medical history who presents to the Emergency Department complaining of intermittent, ongoing  L sided chest pain that radiates downward into the back x 2 weeks; worsened at 8:30 PM this evening. There is associated nausea and shortness of breath. No aggravating or alleviating factors at this time. States the naproxen he was prescribed "helped for a little while."  Dose prior to arrival without any improvement for discomfort. Last dose at 7:00 PM this evening. No recent diaphoresis, fever, chills, cough, leg swelling, or abdominal pain. Pt was seen twice in the ED in the end of June for shortness of breath and chest pain and was told discomfort was predominantly musculoskeletal. He also followed up with his primary physician who prescribed him Flexeril.  Jeffrey Lin was discharged home with a prescription for Naproxen. No known allergies to medications.  Past Medical History  Diagnosis Date  . Chronic leg pain   . Anxiety    Past Surgical History  Procedure Laterality Date  . 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 Bone graft from hip to jaw  2004  . Bone graft right side of head to jaw  2006  . Sphenopalatine ganglionic    . Block procedure at pain center  03/27/06   Family History  Problem Relation Age of Onset  . Hyperlipidemia Mother   . Hypertension Mother    History  Substance Use Topics  . Smoking status: Current Every Day Smoker -- 1.00 packs/day for 10 years    Types: Cigarettes  . Smokeless tobacco: Former Neurosurgeon  . Alcohol Use: No     Review of Systems  Constitutional: Negative for fever, chills and diaphoresis.  Respiratory: Positive for shortness of breath. Negative for cough.   Cardiovascular: Positive for chest pain. Negative for leg swelling.  Gastrointestinal: Positive for nausea. Negative for vomiting and abdominal pain.  Neurological: Negative for headaches.  Psychiatric/Behavioral: Negative for confusion.  All other systems reviewed and are negative.     Allergies  Review of patient's allergies indicates no known allergies.  Home Medications   Prior to Admission medications   Medication Sig Start Date End Date Taking? Authorizing Provider  clonazePAM (KLONOPIN) 1 MG tablet Take 1 mg by mouth 3 (three) times daily as needed. 07/07/14   Historical Provider, MD  cyclobenzaprine (FLEXERIL) 10 MG tablet Take 1 tablet (10 mg total) by mouth at bedtime as needed for muscle spasms. 07/19/14   Amy Michelle Nasuti, MD  diclofenac (VOLTAREN) 75 MG EC tablet Take 1 tablet (75 mg total) by mouth 2 (two) times daily. Patient not taking: Reported on 07/14/2014 05/21/13   Excell Seltzer, MD  gabapentin (NEURONTIN) 600 MG tablet Take 1,200-1,800 mg by mouth See admin instructions. Take either two tablets ( ) three times a day or three tablets ( ) two times a day.    Historical Provider, MD  naproxen (NAPROSYN) 500 MG tablet Take 1 tablet (500 mg total) by mouth 2 (two) times daily. 07/17/14  Joycie Peek, PA-C   Triage Vitals: BP 123/79 mmHg  Pulse 80  Temp(Src) 97.6 F (36.4 C) (Oral)  Resp 16  SpO2 94%   Physical Exam  Constitutional: He is oriented to person, place, and time. He appears well-developed and well-nourished. No distress.  HENT:  Head: Normocephalic and atraumatic.  Cardiovascular: Normal rate, regular rhythm and normal heart sounds.   No murmur heard. Pulmonary/Chest: Effort normal and breath sounds normal. No respiratory distress. He has no wheezes. He exhibits tenderness.  Abdominal: Soft.  Bowel sounds are normal. There is no tenderness. There is no rebound.  Musculoskeletal: He exhibits no edema.  Neurological: He is alert and oriented to person, place, and time.  Skin: Skin is warm and dry.  Psychiatric: He has a normal mood and affect.  Nursing note and vitals reviewed.   ED Course  Procedures (including critical care time)  DIAGNOSTIC STUDIES: Oxygen Saturation is 99% on RA, Normal by my interpretation.    COORDINATION OF CARE: 12:13 AM- Will order EKG, CBC, BMP, BNP, i-stat troponin, and CXR. Discussed treatment plan with pt at bedside and pt agreed to plan.     Labs Review Labs Reviewed  CBC - Abnormal; Notable for the following:    WBC 11.3 (*)    All other components within normal limits  BASIC METABOLIC PANEL - Abnormal; Notable for the following:    Glucose, Bld 110 (*)    All other components within normal limits  BRAIN NATRIURETIC PEPTIDE  I-STAT TROPOININ, ED    Imaging Review Dg Chest 2 View  07/26/2014   CLINICAL DATA:  Subacute onset of left-sided chest pain for 2 weeks. Initial encounter.  EXAM: CHEST  2 VIEW  COMPARISON:  Chest radiograph and CTA of the chest performed 07/14/2014  FINDINGS: The lungs are well-aerated. Scarring is again noted at the left lung base. There may be mild superimposed atelectasis. There is no evidence of pleural effusion or pneumothorax.  The heart is normal in size; the mediastinal contour is within normal limits. No acute osseous abnormalities are seen.  IMPRESSION: Scarring again noted at the left lung base. There may be mild superimposed atelectasis.   Electronically Signed   By: Roanna Raider M.D.   On: 07/26/2014 01:19     EKG Interpretation   Date/Time:  Tuesday July 26 2014 00:12:40 EDT Ventricular Rate:  96 PR Interval:  156 QRS Duration: 100 QT Interval:  340 QTC Calculation: 429 R Axis:   99 Text Interpretation:  Normal sinus rhythm Biatrial enlargement Rightward  axis T wave abnormality, consider  inferior ischemia Abnormal ECG Confirmed  by Renesmee Raine  MD, Jakaila Norment ([id number]) on 07/26/2014 12:14:26 AM      MDM   Final diagnoses:  Chest wall pain    Patient presents with chest pain. Has been seen 3 prior times and diagnosed with chest wall pain. His pain is somewhat relieved with anti-inflammatory.  He is a current smoker but otherwise is low risk for ACS. EKG is reassuring and nonischemic. Chest x-ray is unchanged and basic lab work including troponin is reassuring. He does have reproducible pain on exam. He was given Toradol with improvement of his symptoms. Discussed with patient that consistent with chest wall pain; however, given his recurrent presentations for similar pain, he should call cardiology at discharge for further evaluation and possible stress testing.  After history, exam, and medical workup I feel the patient has been appropriately medically screened and is safe for discharge home. Pertinent diagnoses were  discussed with the patient. Patient was given return precautions.  I personally performed the services described in this documentation, which was scribed in my presence. The recorded information has been reviewed and is accurate.   Shon Batonourtney F Waunita Sandstrom, MD 07/26/14 0300

## 2014-07-26 NOTE — Patient Instructions (Signed)
[facility]  Update her in a few days.   Stop naproxen, start prednisone.  Take it with food.  Don't change your other meds.  Take care.  Glad to see you.

## 2014-07-26 NOTE — Discharge Instructions (Signed)

## 2014-07-27 DIAGNOSIS — R0789 Other chest pain: Secondary | ICD-10-CM | POA: Insufficient documentation

## 2014-07-27 NOTE — Assessment & Plan Note (Signed)
Possible radicular source.  D/w pt.  Start pred course, stop nsaids,  Routine steroid cautions given.  He agrees.   D/w pt after the OV.  Routed to PCP as FYI.  He'll update us.  No change in other meds.  >25 minutes spent in face to face time with patient, >50% spent in counselling or coordination of care.

## 2014-08-15 ENCOUNTER — Encounter: Payer: Self-pay | Admitting: Internal Medicine

## 2014-08-15 ENCOUNTER — Ambulatory Visit (INDEPENDENT_AMBULATORY_CARE_PROVIDER_SITE_OTHER): Payer: BLUE CROSS/BLUE SHIELD | Admitting: Internal Medicine

## 2014-08-15 VITALS — BP 116/78 | HR 82 | Ht 78.0 in | Wt 209.0 lb

## 2014-08-15 DIAGNOSIS — R079 Chest pain, unspecified: Secondary | ICD-10-CM

## 2014-08-15 NOTE — Patient Instructions (Signed)
Medication Instructions:   Your physician recommends that you continue on your current medications as directed. Please refer to the Current Medication list given to you today.    Labwork:   Testing/Procedures:   Follow-Up:  AS NEEDED FOR CARDIAC RELATED SYMPTOMS   Any Other Special Instructions Will Be Listed Below (If Applicable).

## 2014-08-15 NOTE — Progress Notes (Signed)
Cardiology Office Note   Date:  08/15/2014   ID:  Jeffrey Lin, DOB November 27, 1975, MRN [id number]  PCP:  Kerby Nora, MD  Cardiologist:   Dietrich Pates, MD   No chief complaint on file.  Pt is referred fro CP     History of Present Illness: Jeffrey Lin is a 39 y.o. male with a history of CP   Seen in R on 6/23 and 6/26.  Felt to be chest wall pain.   Still with intermittent chest pains  4 to 5 x per day  Not associated  with activity  Does get SOB  Pain is pleuritic.   No cough    No real wheezing.   Current Outpatient Prescriptions  Medication Sig Dispense Refill  . chlordiazePOXIDE (LIBRIUM) 25 MG capsule Take 25 mg by mouth 2 (two) times daily.    . clonazePAM (KLONOPIN) 1 MG tablet Take 1 mg by mouth 3 (three) times daily as needed.  3  . cyclobenzaprine (FLEXERIL) 10 MG tablet Take 1 tablet (10 mg total) by mouth at bedtime as needed for muscle spasms. 15 tablet 0  . gabapentin (NEURONTIN) 600 MG tablet Take 1,200-1,800 mg by mouth See admin instructions. Take either two tablets (1200mg ) three times a day or three tablets (1800mg ) two times a day.    . sertraline (ZOLOFT) 100 MG tablet Take 100 mg by mouth 2 (two) times daily.     No current facility-administered medications for this visit.    Allergies:   Review of patient's allergies indicates no known allergies.   Past Medical History  Diagnosis Date  . Chronic leg pain   . Anxiety     Past Surgical History  Procedure Laterality Date  . 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 Bone graft from hip to jaw  2004  . Bone graft right side of head to jaw  2006  . Sphenopalatine ganglionic    . Block procedure at pain center  03/27/06     Social History:  The patient  reports that he has been smoking Cigarettes.  He has a 10 pack-year smoking history. He has quit using smokeless tobacco. He reports that he does not drink alcohol or use illicit drugs.   20 p year year    Family History:  The patient's family  history includes Hyperlipidemia in his mother; Hypertension in his mother.    ROS:  Please see the history of present illness. All other systems are reviewed and  Negative to the above problem except as noted.    PHYSICAL EXAM: VS:  BP 116/78 mmHg  Pulse 82  Ht 6\' 6"  (1.981 m)  Wt 209 lb (94.802 kg)  BMI 24.16 kg/m2  GEN: Well nourished, well developed, in no acute distress HEENT: normal Neck: no JVD, carotid bruits, or masses Cardiac: RRR; no murmurs, rubs, or gallops,no edema  Respiratory:  clear to auscultation bilaterally, normal work of breathing GI: soft, nontender, nondistended, + BS  No hepatomegaly  MS: no deformity Moving all extremities   Skin: warm and dry, no rash Neuro:  Strength and sensation are intact Psych: euthymic mood, full affect   EKG:  EKG is ordered today.  SR 79 bpm     Lipid Panel    Component Value Date/Time   CHOL 156 09/28/2010 1005   TRIG 102.0 09/28/2010 1005   HDL 33.80* 09/28/2010 1005   CHOLHDL 5 09/28/2010 1005   VLDL 20.4 09/28/2010 1005  LDLCALC 102* 09/28/2010 1005      Wt Readings from Last 3 Encounters:  08/15/14 209 lb (94.802 kg)  07/26/14 215 lb 12 oz (97.864 kg)  07/19/14 216 lb 8 oz (98.204 kg)      ASSESSMENT AND PLAN:  1.  CP  I do not think pts CP is cardiac in origin  Most likely musculoskeletal    He does have some SOB  He is very anxious I would recomm an echo to eval LV/RV function, est pulmonary pressures   F/U based on test results     Signed, Dietrich Pates, MD  08/15/2014 3:46 PM    [facility] [address] [place], [place], [place]  [address] Phone: [phone]; Fax: [phone]

## 2014-09-20 ENCOUNTER — Ambulatory Visit (INDEPENDENT_AMBULATORY_CARE_PROVIDER_SITE_OTHER): Payer: BLUE CROSS/BLUE SHIELD | Admitting: Family Medicine

## 2014-09-20 ENCOUNTER — Encounter: Payer: Self-pay | Admitting: Family Medicine

## 2014-09-20 VITALS — BP 120/92 | HR 97 | Temp 97.4°F | Ht 78.0 in | Wt 207.8 lb

## 2014-09-20 DIAGNOSIS — S29012A Strain of muscle and tendon of back wall of thorax, initial encounter: Secondary | ICD-10-CM

## 2014-09-20 DIAGNOSIS — M546 Pain in thoracic spine: Secondary | ICD-10-CM

## 2014-09-20 DIAGNOSIS — R0789 Other chest pain: Secondary | ICD-10-CM | POA: Diagnosis not present

## 2014-09-20 MED ORDER — CYCLOBENZAPRINE HCL 10 MG PO TABS: 10.0000 mg | ORAL_TABLET | Freq: Every evening | ORAL | Status: DC | PRN

## 2014-09-20 MED ORDER — DICLOFENAC SODIUM 75 MG PO TBEC: 75.0000 mg | DELAYED_RELEASE_TABLET | Freq: Two times a day (BID) | ORAL | Status: DC

## 2014-09-20 NOTE — Patient Instructions (Signed)
Start diclofenac twice daily as needed for pain.  Start muscle relaxant at bedtime.  Ice, massage, start home stretching.  Remain out of work for remainder of week.  Return to work on 09/27/2014.

## 2014-09-20 NOTE — Progress Notes (Signed)
Pre visit review using our clinic review tool, if applicable. No additional management support is needed unless otherwise documented below in the visit note. 

## 2014-09-20 NOTE — Assessment & Plan Note (Addendum)
NSAID, muscle relaxant, ice , massage and given stretching exercises.  No repetitive trwisting or liftin g> 5 lbs in next week, then able to return to work as pain resolving.

## 2014-09-20 NOTE — Progress Notes (Signed)
   Subjective:    Patient ID: Jeffrey Lin, male    DOB: 22-Nov-1975, 39 y.o.   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   He reports pain starting in upper back yesterday, gradual onset.  Went to bed, awoke with continued pain. 8/10 on pain scale.  Tried stretching, worse with this. No fall, no injury. He describes as burning between shoulder blades, sharp pain shoots down spine to low back.  He has not taken any med for pain.  For work he is constantly moving upper body, twisting, some repetitive motion, lifts no more than 10 lbs. 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     Review of Systems  Constitutional: Negative for fever and fatigue.  HENT: Negative for ear pain.   Eyes: Negative for pain.  Respiratory: Negative for shortness of breath.   Cardiovascular: Negative for chest pain.  Musculoskeletal: Negative for joint swelling and gait problem.  Neurological: Negative for dizziness, weakness and numbness.       Objective:   Physical Exam  Constitutional: Vital signs are normal. He appears well-developed and well-nourished.  HENT:  Head: Normocephalic.  Right Ear: Hearing normal.  Left Ear: Hearing normal.  Nose: Nose normal.  Mouth/Throat: Oropharynx is clear and moist and mucous membranes are normal.  Neck: Trachea normal. Carotid bruit is not present. No thyroid mass and no thyromegaly present.  Cardiovascular: Normal rate, regular rhythm and normal pulses.  Exam reveals no gallop, no distant heart sounds and no friction rub.   No murmur heard. No peripheral edema  Pulmonary/Chest: Effort normal and breath sounds normal. No respiratory distress.  Musculoskeletal:       Cervical back: He exhibits tenderness. He exhibits normal range of  motion, no bony tenderness and no swelling.       Thoracic back: He exhibits decreased range of motion, tenderness and bony tenderness.  Pain worst in mdd back with moving right arm.  Minimal shoulder ttp. ttp greatest over rhomboid and trapezius on right.  Skin: Skin is warm, dry and intact. No rash noted.  Psychiatric: He has a normal mood and affect. His speech is normal and behavior is normal. Thought content normal.          Assessment & Plan:

## 2014-10-02 ENCOUNTER — Other Ambulatory Visit: Payer: Self-pay | Admitting: Family Medicine

## 2014-10-02 NOTE — Telephone Encounter (Signed)
Last office visit 09/20/2014.  Last refilled 09/20/2014.  Ok to refill?

## 2014-10-14 ENCOUNTER — Encounter: Payer: Self-pay | Admitting: Family Medicine

## 2014-10-14 ENCOUNTER — Ambulatory Visit (INDEPENDENT_AMBULATORY_CARE_PROVIDER_SITE_OTHER): Payer: BLUE CROSS/BLUE SHIELD | Admitting: Family Medicine

## 2014-10-14 VITALS — BP 122/86 | HR 99 | Temp 97.8°F | Ht 78.0 in | Wt 217.5 lb

## 2014-10-14 DIAGNOSIS — B351 Tinea unguium: Secondary | ICD-10-CM | POA: Diagnosis not present

## 2014-10-14 DIAGNOSIS — S39012D Strain of muscle, fascia and tendon of lower back, subsequent encounter: Secondary | ICD-10-CM

## 2014-10-14 DIAGNOSIS — M546 Pain in thoracic spine: Secondary | ICD-10-CM | POA: Diagnosis not present

## 2014-10-14 LAB — HEPATIC FUNCTION PANEL
ALBUMIN: 4.2 g/dL (ref 3.6–5.1)
ALK PHOS: 100 U/L (ref 40–115)
ALT: 41 U/L (ref 9–46)
AST: 28 U/L (ref 10–40)
BILIRUBIN DIRECT: 0.1 mg/dL (ref <=0.2)
Indirect Bilirubin: 0.3 mg/dL (ref 0.2–1.2)
Total Bilirubin: 0.4 mg/dL (ref 0.2–1.2)
Total Protein: 6.7 g/dL (ref 6.1–8.1)

## 2014-10-14 MED ORDER — CYCLOBENZAPRINE HCL 5 MG PO TABS: ORAL_TABLET | ORAL | Status: DC

## 2014-10-14 NOTE — Assessment & Plan Note (Signed)
Likely great toe nail with fall of.. If it does not can consider podiatry referral for removal.  Treat other nails for fungal infection with terbinafine if LFTs return in nml range.

## 2014-10-14 NOTE — Progress Notes (Signed)
   Subjective:    Patient ID: Jeffrey Lin, male    DOB: 01-Apr-1975, 39 y.o.   MRN: [id number]  HPI  39 year old male with history of chronic pain from past leg injury and past head/jaw surgery presents with continued throacic back pain.  He was seen on  09/20/2014 for rhomboid muscle strain in thoracic spine. Treated with diclofenac and cyclobenzaprine. Recommended stretching exercises.  He has been having back pain in upper back to shoulder blades, also pain in lower back.  No numbness and weakness. Occ felling of tingle in whole body. No new injury.  He has been doing some morning exercises.  Diclofenac helped some but did not last long. Flexeril also helped some , more than diclofenac but did not last long.  He has also noted a black toenail on right great toe. No known injury.  Tender on medial edge where it is digging down. There toe nails yellowed.    Review of Systems  Constitutional: Negative for fever and chills.  HENT: Negative for ear pain.   Eyes: Negative for pain.  Respiratory: Negative for shortness of breath.   Cardiovascular: Negative for chest pain.       Objective:   Physical Exam  Constitutional: Vital signs are normal. He appears well-developed and well-nourished.  HENT:  Head: Normocephalic.  Right Ear: Hearing normal.  Left Ear: Hearing normal.  Nose: Nose normal.  Mouth/Throat: Oropharynx is clear and moist and mucous membranes are normal.  Neck: Trachea normal. Carotid bruit is not present. No thyroid mass and no thyromegaly present.  Cardiovascular: Normal rate, regular rhythm and normal pulses.  Exam reveals no gallop, no distant heart sounds and no friction rub.   No murmur heard. No peripheral edema  Pulmonary/Chest: Effort normal and breath sounds normal. No respiratory distress.  Musculoskeletal:       Cervical back: He exhibits tenderness. He exhibits normal range of motion, no bony tenderness and no swelling.       Thoracic back: He  exhibits decreased range of motion, tenderness and bony tenderness.       Lumbar back: He exhibits tenderness. He exhibits normal range of motion, no bony tenderness and no swelling.   Neg SLR , no faber's  Neurological: He has normal strength. He displays no atrophy. No sensory deficit. Gait abnormal.  Stable but chronic abnormal gait from past knee injury.  Skin: Skin is warm, dry and intact. No rash noted.  toenails yellowed and dicolered. Right great toenail yellowed with central darkness, under nail it is obvious it is no longer attached to nail bed.  Psychiatric: He has a normal mood and affect. His speech is normal and behavior is normal. Thought content normal.          Assessment & Plan:

## 2014-10-14 NOTE — Assessment & Plan Note (Signed)
Without radiculopathy.  Treat with home PT and muscle relaxant. If not improving refer to PT ( pt refused referral today)

## 2014-10-14 NOTE — Addendum Note (Signed)
Addended by: Alvina Chou on: 10/14/2014 05:13 PM   Modules accepted: Orders

## 2014-10-14 NOTE — Progress Notes (Signed)
Pre visit review using our clinic review tool, if applicable. No additional management support is needed unless otherwise documented below in the visit note. 

## 2014-10-14 NOTE — Patient Instructions (Addendum)
Continue home stretching 2 times daily.  Use flexeril every eight hours as needed .  Call if not improving in 2 weeks for referral to PT.  Stop at lab on way out for liver check.

## 2014-10-14 NOTE — Assessment & Plan Note (Signed)
Treat with home PT and muscle relaxant. If not improving refer to PT ( pt refused referral today)

## 2014-10-18 ENCOUNTER — Telehealth: Payer: Self-pay | Admitting: *Deleted

## 2014-10-18 MED ORDER — TERBINAFINE HCL 250 MG PO TABS: 250.0000 mg | ORAL_TABLET | Freq: Every day | ORAL | Status: DC

## 2014-10-18 NOTE — Addendum Note (Signed)
Addended byKerby Nora E on: 10/18/2014 12:45 PM   Modules accepted: Orders

## 2014-10-18 NOTE — Telephone Encounter (Signed)
Received fax from [facility] requesting Prior Authorization for Terbinafine 250 mg.  PA completed on CoverMyMeds.  Will await response which can take up to 3 business days.

## 2014-10-24 NOTE — Telephone Encounter (Addendum)
PA Denied.  Letter states that after reviewing the information supplied it was determined that patient does not meet the [facility] Medical Necessity Criteria for this drug.  [facility] notified PA was denied.

## 2014-10-26 ENCOUNTER — Ambulatory Visit (INDEPENDENT_AMBULATORY_CARE_PROVIDER_SITE_OTHER)
Admission: RE | Admit: 2014-10-26 | Discharge: 2014-10-26 | Disposition: A | Discharge status: Home / Self Care | Payer: BLUE CROSS/BLUE SHIELD | Source: Ambulatory Visit | Attending: Family Medicine | Admitting: Family Medicine

## 2014-10-26 ENCOUNTER — Encounter: Payer: Self-pay | Admitting: Family Medicine

## 2014-10-26 ENCOUNTER — Ambulatory Visit (INDEPENDENT_AMBULATORY_CARE_PROVIDER_SITE_OTHER): Payer: BLUE CROSS/BLUE SHIELD | Admitting: Family Medicine

## 2014-10-26 VITALS — BP 110/70 | HR 102 | Temp 98.4°F | Wt 215.5 lb

## 2014-10-26 DIAGNOSIS — M5441 Lumbago with sciatica, right side: Secondary | ICD-10-CM | POA: Diagnosis not present

## 2014-10-26 DIAGNOSIS — M5442 Lumbago with sciatica, left side: Secondary | ICD-10-CM

## 2014-10-26 DIAGNOSIS — M5440 Lumbago with sciatica, unspecified side: Secondary | ICD-10-CM

## 2014-10-26 MED ORDER — PREDNISONE 20 MG PO TABS: ORAL_TABLET | ORAL | Status: DC

## 2014-10-26 NOTE — Progress Notes (Signed)
Pre visit review using our clinic review tool, if applicable. No additional management support is needed unless otherwise documented below in the visit note.  He's trying to cut back on smoking, d/w pt.  Encouraged.   Burning pain in the back.  "Like somebody has a knee in my back."  Pain sitting, pain standing, pain laying down.  H/o bone grafts prev but not in the back.  He does have tingling in the R>L leg.  On baseline meds.  He'll occ feel something warm running down the medial R leg.  No weakness.   Already on gabapentin.  This is clearly different from the back pain he had earlier this summer when I saw him in July.    He has run out of flexeril but I told him I couldn't fill it early.    PMH and SH reviewed  ROS: See HPI, otherwise noncontributory.  Meds, vitals, and allergies reviewed.   nad Uncomfortable  ncat Mmm rrr ctab abd soft, not ttp Reports diffuse tenderness along the T and L spine on palpation, also with B lower L spine paraspinal muscle tenderness on palpation SLR positive, with more pain on R>L testing No weakness in the BLE, able to bear weight.

## 2014-10-26 NOTE — Patient Instructions (Signed)
Stop diclofenac for now and start prednisone.  Take prednisone with food.  Go to the lab on the way out.  We'll contact you with your xray report. I'll talk to [facility] in the meantime.  Take care.

## 2014-10-27 ENCOUNTER — Telehealth: Payer: Self-pay | Admitting: *Deleted

## 2014-10-27 ENCOUNTER — Encounter: Payer: Self-pay | Admitting: Family Medicine

## 2014-10-27 DIAGNOSIS — M545 Low back pain, unspecified: Secondary | ICD-10-CM | POA: Insufficient documentation

## 2014-10-27 MED ORDER — CYCLOBENZAPRINE HCL 5 MG PO TABS: ORAL_TABLET | ORAL | Status: DC

## 2014-10-27 NOTE — Telephone Encounter (Signed)
Ok to refill Flexeril per Dr. Ermalene Searing.  See Lab result note.

## 2014-10-27 NOTE — Assessment & Plan Note (Signed)
With sciatica sx.  D/w pt about options.  Would use short term steroid course, with no nsaids in meantime.  Steroid cautions given.  Check L spine films- reviewed independently.  See report.  Will d/w PCP about options.  We may have to refer out depending on response to steroids.

## 2014-10-28 NOTE — Telephone Encounter (Signed)
Letter printed/signed in donna's box

## 2014-10-28 NOTE — Telephone Encounter (Signed)
Spoke with [employer] in regards to an appeal for Terbinafine 250 mg.  They state that the appeal needs to go through [employer].  They state the doctor needs to write a letter for medical necessity and fax it to [employer] at [phone].

## 2014-10-28 NOTE — Telephone Encounter (Signed)
Appeal Letter faxed to [employer] at [phone].

## 2014-11-03 MED ORDER — TERBINAFINE HCL 250 MG PO TABS: 250.0000 mg | ORAL_TABLET | Freq: Every day | ORAL | Status: DC

## 2014-11-03 NOTE — Telephone Encounter (Signed)
[name] notified he can get the [facility]fine [facility]  It will cost $4 for a 30 day supply.  [facility] [place]

## 2014-11-03 NOTE — Telephone Encounter (Addendum)
Appeal Denied. Upon review the original denial was correct because it was again determined that you do not meet the [employer] Medical Necessity Criteria for coverage as requested.  [name] notified.  He wants to know what OTC medication you would recommend for his toenails.  Please advise.

## 2014-11-03 NOTE — Addendum Note (Signed)
Addended by: Damita LackLORING, Swayze Kozuch S on: 11/03/2014 04:46 PM   Modules accepted: Orders

## 2014-11-03 NOTE — Telephone Encounter (Signed)
You can get this for 4 dollars a month [facility], [facility].

## 2014-11-09 ENCOUNTER — Telehealth: Payer: Self-pay

## 2014-11-09 MED ORDER — DICLOFENAC SODIUM 75 MG PO TBEC: DELAYED_RELEASE_TABLET | ORAL | Status: DC

## 2014-11-09 NOTE — Telephone Encounter (Signed)
Patient notified as instructed by telephone and verbalized understanding. Patient stated that he only has one diclofenac left and would like a refill. Last refill 10/02/14 #30. Pharmacy [facility]

## 2014-11-09 NOTE — Telephone Encounter (Signed)
Pt called stating he is having muscle/ chest wall pain in which he has been seen for this before and says he is usually given prednisone. Pt wants to know if medication can be called in to pharmacy. Pt was seen and  prescribed Prednisone 10/26/14 by Dr Ezekiel Inauncan--please advise

## 2014-11-09 NOTE — Telephone Encounter (Signed)
Tried to contact patient and received message that wireless customer not available and to try to call back later. Will try again later.

## 2014-11-09 NOTE — Telephone Encounter (Signed)
n

## 2014-11-09 NOTE — Telephone Encounter (Signed)
Patient notified as instructed by telephone and verbalized understanding. 

## 2014-11-09 NOTE — Telephone Encounter (Signed)
My practice pattern would differ here. NSAIDS I think would be reasonable.   I am going to cc Dr. Ermalene SearingBedsole and Dr. Para Marchuncan, who recently saw for this condition.  I know Dr. Para Marchuncan will be here in a few hours.  Recent eval by Dr. Tenny Crawoss from cardiology, also.

## 2014-11-09 NOTE — Telephone Encounter (Signed)
Sent, will defer to PCP o/w.

## 2014-11-09 NOTE — Telephone Encounter (Signed)
I would not repeat the prednisone.  That was a short term intervention for the lower back pain.  I agreed with nsaids in the meantime with GI cautions, ie diclofenac.

## 2014-11-09 NOTE — Addendum Note (Signed)
Addended by: Damita LackLORING, Jacelyn Cuen S on: 11/09/2014 12:52 PM   Modules accepted: Orders

## 2014-11-09 NOTE — Telephone Encounter (Signed)
Ok to refill Voltaren 75 mg tab, 1 po bid, #60, 2 ref

## 2014-11-17 ENCOUNTER — Other Ambulatory Visit: Payer: Self-pay | Admitting: Family Medicine

## 2014-11-17 NOTE — Telephone Encounter (Signed)
Last office visit 10/26/2014 with Dr. Para Marchuncan.  Last refilled 10/27/2014 for #90 with no refills.   Refill?

## 2014-11-28 ENCOUNTER — Telehealth: Payer: Self-pay | Admitting: Family Medicine

## 2014-11-28 ENCOUNTER — Encounter: Payer: Self-pay | Admitting: Internal Medicine

## 2014-11-28 ENCOUNTER — Ambulatory Visit (INDEPENDENT_AMBULATORY_CARE_PROVIDER_SITE_OTHER): Payer: BLUE CROSS/BLUE SHIELD | Admitting: Internal Medicine

## 2014-11-28 VITALS — BP 136/90 | HR 110 | Temp 97.7°F | Wt 215.0 lb

## 2014-11-28 DIAGNOSIS — K921 Melena: Secondary | ICD-10-CM

## 2014-11-28 NOTE — Progress Notes (Signed)
Pre visit review using our clinic review tool, if applicable. No additional management support is needed unless otherwise documented below in the visit note. 

## 2014-11-28 NOTE — Telephone Encounter (Signed)
Pt has appt 11/28/14 at 4:15 with [name] NP.

## 2014-11-28 NOTE — Progress Notes (Signed)
Subjective:    Patient ID: Jeffrey Lin, male    DOB: 12/31/1975, 39 y.o.   MRN: [id number]  HPI  Pt presents to the clinic today with c/o blood in his stool. This occurred x 1 on Saturday. The blood was bright red. He has not noticed any blood since. He denies rectal pain or itching. He does not feel constipated. He has BM's every 2 days. His stool is large but soft. He does not strain. He has no history of hemorrhoids. He does have abdominal cramping before he has to have a BM, and is relieved by BM. He has no family history of colon cancer.  Review of Systems      Past Medical History  Diagnosis Date  . Chronic leg pain   . Anxiety     Current Outpatient Prescriptions  Medication Sig Dispense Refill  . ALPRAZolam (XANAX XR) 2 MG 24 hr tablet Take 2 mg by mouth every morning.  1  . clonazePAM (KLONOPIN) 1 MG tablet Take 1 mg by mouth 2 (two) times daily.   3  . cyclobenzaprine (FLEXERIL) 5 MG tablet TAKE 1 -2 TABLET (5-10 MG TOTAL) BY MOUTH THREE TIMES A DAY AS NEEDED FOR MUSCLE SPASMS. 90 tablet 0  . diclofenac (VOLTAREN) 75 MG EC tablet TAKE 1 TABLET (75 MG TOTAL) BY MOUTH 2 (TWO) TIMES DAILY. With food. 60 tablet 2  . gabapentin (NEURONTIN) 600 MG tablet Take 1,200-1,800 mg by mouth See admin instructions. Take either two tablets (1200mg ) three times a day or three tablets (1800mg ) two times a day.    . predniSONE (DELTASONE) 20 MG tablet 3 a day for 3 days, then 2 a day for 3 days, then 1 a day for 3 days. With food. 18 tablet 0  . sertraline (ZOLOFT) 100 MG tablet Take 100 mg by mouth 2 (two) times daily.    Marland Kitchen. terbinafine (LAMISIL) 250 MG tablet Take 1 tablet (250 mg total) by mouth daily. 30 tablet 2   No current facility-administered medications for this visit.    No Known Allergies  Family History  Problem Relation Age of Onset  . Hyperlipidemia Mother   . Hypertension Mother     Social History   Social History  . Marital Status: Single    Spouse Name: N/A    . Number of Children: 0  . Years of Education: N/A   Occupational History  . Machine Shop Tool and Scientist, forensicDie Maker    Social History Main Topics  . Smoking status: Current Every Day Smoker -- 1.00 packs/day for 10 years    Types: Cigarettes  . Smokeless tobacco: Former NeurosurgeonUser  . Alcohol Use: No  . Drug Use: No  . Sexual Activity: Not on file   Other Topics Concern  . Not on file   Social History Narrative   In relationship, + sex, + condom   Exercise:  None, running causes head pain   Diet:  FF once daily, + fruits and veggies, + H2O, + Soda     Constitutional: Denies fever, malaise, fatigue, headache or abrupt weight changes.  Respiratory: Denies difficulty breathing, shortness of breath, cough or sputum production.   Cardiovascular: Denies chest pain, chest tightness, palpitations or swelling in the hands or feet.  Gastrointestinal: Pt reports blood in stool. Denies abdominal pain, bloating, constipation, diarrhea .  GU: Denies urgency, frequency, pain with urination, burning sensation, blood in urine, odor or discharge.   No other specific complaints in a complete  review of systems (except as listed in HPI above).  Objective:   Physical Exam   BP 136/90 mmHg  Pulse 110  Temp(Src) 97.7 F (36.5 C) (Oral)  Wt 215 lb (97.523 kg)  SpO2 98% Wt Readings from Last 3 Encounters:  11/28/14 215 lb (97.523 kg)  10/26/14 215 lb 8 oz (97.75 kg)  10/14/14 217 lb 8 oz (98.657 kg)    General: Appears his stated age, well developed, well nourished in NAD. Cardiovascular: Normal rate and rhythm. S1,S2 noted.  Pulmonary/Chest: Normal effort and positive vesicular breath sounds. No respiratory distress. No wheezes, rales or ronchi noted.  Abdomen: Soft and nontender.  Rectal: No external hemorrhoid noted. Normal rectal tone. No mass noted.  BMET    Component Value Date/Time   NA 140 07/26/2014 0015   NA 145 09/11/2013 0406   K 3.9 07/26/2014 0015   K 3.6 09/11/2013 0406   CL  101 07/26/2014 0015   CL 110* 09/11/2013 0406   CO2 31 07/26/2014 0015   CO2 26 09/11/2013 0406   GLUCOSE 110* 07/26/2014 0015   GLUCOSE 81 09/11/2013 0406   BUN 6 07/26/2014 0015   BUN 19* 09/11/2013 0406   CREATININE 1.11 07/26/2014 0015   CREATININE 1.05 09/11/2013 0406   CALCIUM 9.1 07/26/2014 0015   CALCIUM 8.0* 09/11/2013 0406   GFRNONAA >60 07/26/2014 0015   GFRNONAA >60 09/11/2013 0406   GFRAA >60 07/26/2014 0015   GFRAA >60 09/11/2013 0406    Lipid Panel     Component Value Date/Time   CHOL 156 09/28/2010 1005   TRIG 102.0 09/28/2010 1005   HDL 33.80* 09/28/2010 1005   CHOLHDL 5 09/28/2010 1005   VLDL 20.4 09/28/2010 1005   LDLCALC 102* 09/28/2010 1005    CBC    Component Value Date/Time   WBC 11.3* 07/26/2014 0015   WBC 7.8 09/11/2013 0406   RBC 5.30 07/26/2014 0015   RBC 4.28* 09/11/2013 0406   HGB 16.8 07/26/2014 0015   HGB 13.1 09/11/2013 0406   HCT 49.0 07/26/2014 0015   HCT 39.2* 09/11/2013 0406   PLT 219 07/26/2014 0015   PLT 171 09/11/2013 0406   MCV 92.5 07/26/2014 0015   MCV 92 09/11/2013 0406   MCH 31.7 07/26/2014 0015   MCH 30.5 09/11/2013 0406   MCHC 34.3 07/26/2014 0015   MCHC 33.3 09/11/2013 0406   RDW 13.3 07/26/2014 0015   RDW 12.8 09/11/2013 0406   LYMPHSABS 3.4 07/14/2014 1300   LYMPHSABS 3.3 09/11/2013 0406   MONOABS 0.3 07/14/2014 1300   MONOABS 0.4 09/11/2013 0406   EOSABS 0.2 07/14/2014 1300   EOSABS 0.1 09/11/2013 0406   BASOSABS 0.0 07/14/2014 1300   BASOSABS 0.0 09/11/2013 0406    Hgb A1C No results found for: HGBA1C      Assessment & Plan:   Blood in stool:  No reoccurrence Hemoccult neg Advised him to push fluids He can take a stool softener if needed Avoid straining Will continue to monitor for blood in the stool  RTC as needed or if symptoms persist or worsen

## 2014-11-28 NOTE — Telephone Encounter (Signed)
[facility] - Client TELEPHONE ADVICE RECORD [facility]  Patient Name: Jeffrey Lin  DOB: 01/22/1975    Initial Comment caller states he has rectal bleeding   Nurse Assessment  Nurse: Laural BenesJohnson, RN, Dondra SpryGail Date/Time [name](Eastern Time): 11/28/2014 9:50:44 AM  Confirm and document reason for call. If symptomatic, describe symptoms. ---Reita ClicheBobby noted he had blood in stool on Saturday -- had one incident of blood in stool. denies hemorrhoids.  Has the patient traveled out of the country within the last 30 days? ---No  Does the patient have any new or worsening symptoms? ---Yes  Will a triage be completed? ---Yes  Related visit to physician within the last 2 weeks? ---No  Does the PT have any chronic conditions? (i.e. diabetes, asthma, etc.) ---No     Guidelines    Guideline Title Affirmed Question Affirmed Notes  Rectal Bleeding MODERATE rectal bleeding (small blood clots, passing blood without stool, or toilet water turns red)    Final Disposition User   See Physician within 24 Hours Laural BenesJohnson, RN, Dondra SpryGail    Comments  States he has appt at 415pm today scheduled it already   Referrals  REFERRED TO PCP OFFICE   Disagree/Comply: Comply

## 2014-12-14 ENCOUNTER — Other Ambulatory Visit: Payer: Self-pay | Admitting: Family Medicine

## 2014-12-14 NOTE — Telephone Encounter (Signed)
Last office visit 11/28/2014 with Jeffrey Lin.  Last refilled 11/17/2014 for #90 with no refills.  Ok to refill?

## 2014-12-31 ENCOUNTER — Other Ambulatory Visit: Payer: Self-pay | Admitting: Family Medicine

## 2014-12-31 NOTE — Telephone Encounter (Signed)
Last filled 12/19/2014 #90 --please advise

## 2015-01-02 MED ORDER — CYCLOBENZAPRINE HCL 5 MG PO TABS: 5.0000 mg | ORAL_TABLET | Freq: Three times a day (TID) | ORAL | Status: DC | PRN

## 2015-01-02 NOTE — Addendum Note (Signed)
Addended by: Roena MaladyEVONTENNO, Oviya Ammar Y on: 01/02/2015 09:57 AM   Modules accepted: Orders

## 2015-01-14 ENCOUNTER — Other Ambulatory Visit: Payer: Self-pay | Admitting: Family Medicine

## 2015-01-16 NOTE — Telephone Encounter (Signed)
Last office visit 11/28/14 with Jeffrey Lin.  Last refilled 01/02/2015 for #90 with no refills.  Ok to refill?

## 2015-02-02 ENCOUNTER — Other Ambulatory Visit: Payer: Self-pay | Admitting: Family Medicine

## 2015-02-03 ENCOUNTER — Telehealth: Payer: Self-pay

## 2015-02-03 MED ORDER — CYCLOBENZAPRINE HCL 5 MG PO TABS: 5.0000 mg | ORAL_TABLET | Freq: Every day | ORAL | Status: DC

## 2015-02-03 MED ORDER — CYCLOBENZAPRINE HCL 5 MG PO TABS: ORAL_TABLET | ORAL | Status: DC

## 2015-02-03 NOTE — Telephone Encounter (Signed)
Corrected rx sent to pharmacy.

## 2015-02-03 NOTE — Telephone Encounter (Signed)
Okey Regalarol with [facility] left v/m requesting cb with clarification of more than one set of instructions on cyclobenzaprine.Please advise.

## 2015-02-03 NOTE — Telephone Encounter (Signed)
Last office visit 11/28/2014 with [name].  Last refilled 01/17/2015 for #90 with no refills.  Ok to refill?

## 2015-02-06 ENCOUNTER — Other Ambulatory Visit: Payer: Self-pay | Admitting: Family Medicine

## 2015-02-06 NOTE — Telephone Encounter (Signed)
Electronically refill request. Ok to refill? Patient just had it refill 2 weeks ago on 01/17/2015. Last seen on 11/28/2014. No future appt.

## 2015-02-06 NOTE — Telephone Encounter (Signed)
Pt left v/m;miscommunication between pt and pharmacy; pt does not need refill on flexeril and request the pharmacy request to be removed. Advised pt done.

## 2015-02-13 ENCOUNTER — Other Ambulatory Visit: Payer: Self-pay | Admitting: Family Medicine

## 2015-02-13 NOTE — Telephone Encounter (Signed)
Last office visit 11/28/2014 with Nicki Reaper.  Last refilled 02/03/2015 for #90.  Ok to refill?

## 2015-02-14 ENCOUNTER — Encounter: Payer: Self-pay | Admitting: Family Medicine

## 2015-02-14 ENCOUNTER — Ambulatory Visit (INDEPENDENT_AMBULATORY_CARE_PROVIDER_SITE_OTHER): Payer: Managed Care, Other (non HMO) | Admitting: Family Medicine

## 2015-02-14 VITALS — BP 138/90 | HR 93 | Temp 97.3°F | Ht 78.0 in | Wt 229.5 lb

## 2015-02-14 DIAGNOSIS — E559 Vitamin D deficiency, unspecified: Secondary | ICD-10-CM

## 2015-02-14 DIAGNOSIS — E7800 Pure hypercholesterolemia, unspecified: Secondary | ICD-10-CM | POA: Diagnosis not present

## 2015-02-14 DIAGNOSIS — R7309 Other abnormal glucose: Secondary | ICD-10-CM | POA: Diagnosis not present

## 2015-02-14 MED ORDER — VITAMIN D (ERGOCALCIFEROL) 1.25 MG (50000 UNIT) PO CAPS: 50000.0000 [IU] | ORAL_CAPSULE | ORAL | Status: DC

## 2015-02-14 MED ORDER — ATORVASTATIN CALCIUM 40 MG PO TABS: 40.0000 mg | ORAL_TABLET | Freq: Every day | ORAL | Status: DC

## 2015-02-14 NOTE — Assessment & Plan Note (Signed)
Replete then start vit D OTC daily

## 2015-02-14 NOTE — Patient Instructions (Addendum)
Work on low cholesterol diet.  Start atorvastatin 40 mg daily.  Start vit D 12 week course, then follow with vit  D3 OTC 400 IU daily.  Quit smoking!

## 2015-02-14 NOTE — Assessment & Plan Note (Signed)
Resolved

## 2015-02-14 NOTE — Progress Notes (Signed)
   Subjective:    Patient ID: Jeffrey Lin, male    DOB: 04-30-1975, 40 y.o.   MRN: [id number]  HPI  40 year old male with  Hx of hypercholesterolemia presents for discussion of labs done at his psychiatrist office.  Elevated Cholesterol: Elevated on no medication. Using medications without problems: Muscle aches:  Diet compliance: only eat once a day. Eats a lot at one meal, home cooked from his mom, country food. cheese Exercise: none Other complaints: strong family history of high cholesterol  Prediabetes:  Improved, glucose 87 A1C 5.5  Vit D low at 9.2  Review of Systems  Constitutional: Negative for fatigue.  HENT: Negative for ear pain.   Eyes: Negative for pain.  Respiratory: Negative for shortness of breath.        Objective:   Physical Exam  Constitutional: Vital signs are normal. He appears well-developed and well-nourished.  HENT:  Head: Normocephalic.  Right Ear: Hearing normal.  Left Ear: Hearing normal.  Nose: Nose normal.  Mouth/Throat: Oropharynx is clear and moist and mucous membranes are normal.  Neck: Trachea normal. Carotid bruit is not present. No thyroid mass and no thyromegaly present.  Cardiovascular: Normal rate, regular rhythm and normal pulses.  Exam reveals no gallop, no distant heart sounds and no friction rub.   No murmur heard. No peripheral edema  Pulmonary/Chest: Effort normal and breath sounds normal. No respiratory distress.  Skin: Skin is warm, dry and intact. No rash noted.  Psychiatric: He has a normal mood and affect. His speech is normal and behavior is normal. Thought content normal.          Assessment & Plan:

## 2015-02-14 NOTE — Progress Notes (Signed)
Pre visit review using our clinic review tool, if applicable. No additional management support is needed unless otherwise documented below in the visit note. 

## 2015-02-14 NOTE — Assessment & Plan Note (Signed)
Poor control. Start atorvastatin. Discussed low chol diet and info given. Increase exercise as able.

## 2015-02-15 ENCOUNTER — Encounter (HOSPITAL_COMMUNITY): Payer: Self-pay | Admitting: Psychiatry

## 2015-02-15 ENCOUNTER — Other Ambulatory Visit (HOSPITAL_COMMUNITY): Payer: Managed Care, Other (non HMO) | Attending: Psychiatry | Admitting: Psychiatry

## 2015-02-15 ENCOUNTER — Other Ambulatory Visit: Payer: Self-pay | Admitting: Family Medicine

## 2015-02-15 DIAGNOSIS — F331 Major depressive disorder, recurrent, moderate: Secondary | ICD-10-CM | POA: Insufficient documentation

## 2015-02-15 DIAGNOSIS — F410 Panic disorder [episodic paroxysmal anxiety] without agoraphobia: Secondary | ICD-10-CM | POA: Insufficient documentation

## 2015-02-15 DIAGNOSIS — F411 Generalized anxiety disorder: Secondary | ICD-10-CM

## 2015-02-15 DIAGNOSIS — Z87898 Personal history of other specified conditions: Secondary | ICD-10-CM

## 2015-02-15 DIAGNOSIS — F1191 Opioid use, unspecified, in remission: Secondary | ICD-10-CM

## 2015-02-15 NOTE — Progress Notes (Signed)
Comprehensive Clinical Assessment (CCA) Note  02/15/2015 Jeffrey Lin [id number]  Visit Diagnosis:      ICD-9-CM ICD-10-CM   1. Major depressive disorder, recurrent episode, moderate (HCC) 296.32 F33.1   2. GAD (generalized anxiety disorder) 300.02 F41.1   3. History of narcotic use 305.43 F13.10       CCA Part One  Part One has been completed on paper by the patient.  (See scanned document in Chart Review)  CCA Part Two A  Intake/Chief Complaint:  CCA Intake With Chief Complaint CCA Part Two Date: 02/15/15 CCA Part Two Time: 1508 Chief Complaint/Presenting Problem: This is a 40 yr old single, employed, Caucasian male, who was referred per Dr. Evelene Croon; treatment for ongoing depressive and anxiety symptoms.  Admits to passive SI; denies a plan or intent.  Able to contract for safety.  Admits to daily panic attacks.  According to pt, he has been depressed since age 16.  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  All are African American except one, "but she's married to a black man."  Pt states it has nothing to do with their race, but there's a lot of conflict there.  "They are always keeping something stirred up."  Pt states the company will be closing March 31st and sending all their work to the [place].  Pt states he has been admitted on a psychiatric unit twice [facility] and [facility]).  Pt has been seeing Dr. Evelene Croon for seven years and just recently started with Hurley Cisco, LCSW.  Denies any prior suicide attempts or gestures.  Family Hx:  Maternal GF (Bipolar; Schizophrenia:  Committed suicide); Mother and Brother (Depression & Anxiety)                                                                                                                                                                                                                                                                Patients Currently Reported  Symptoms/Problems: decreased motivation, no energy, anhedonia, indecisiveness, awakenings at night, ruminating thoughts, irritability, tearfulness, paranoid thoughts (feeling people are watching him) Collateral Involvement: Mother and brother are supportive. Individual's Strengths: Pt is motivated for treatment.  Mental Health Symptoms Depression:  Depression: Change in  energy/activity, Difficulty Concentrating, Irritability, Sleep (too much or little), Tearfulness  Mania:  Mania: N/A  Anxiety:   Anxiety: N/A  Psychosis:  Psychosis: Delusions  Trauma:  Trauma: N/A  Obsessions:  Obsessions: N/A  Compulsions:  Compulsions: N/A  Inattention:  Inattention: N/A  Hyperactivity/Impulsivity:  Hyperactivity/Impulsivity: N/A  Oppositional/Defiant Behaviors:  Oppositional/Defiant Behaviors: N/A  Borderline Personality:  Emotional Irregularity: N/A  Other Mood/Personality Symptoms:      Mental Status Exam Appearance and self-care  Stature:  Stature: Tall  Weight:  Weight: Average weight  Clothing:  Clothing: Casual  Grooming:  Grooming: Normal  Cosmetic use:  Cosmetic Use: None  Posture/gait:  Posture/Gait: Normal  Motor activity:  Motor Activity: Not Remarkable  Sensorium  Attention:  Attention: Normal  Concentration:  Concentration: Preoccupied  Orientation:  Orientation: X5  Recall/memory:  Recall/Memory: Normal  Affect and Mood  Affect:  Affect: Depressed, Blunted  Mood:  Mood: Anxious  Relating  Eye contact:  Eye Contact: Normal  Facial expression:  Facial Expression: Depressed  Attitude toward examiner:  Attitude Toward Examiner: Cooperative  Thought and Language  Speech flow: Speech Flow: Normal  Thought content:  Thought Content: Delusions  Preoccupation:  Preoccupations: Ruminations  Hallucinations:     Organization:     Company secretary of Knowledge:  Fund of Knowledge: Average  Intelligence:  Intelligence: Average  Abstraction:  Abstraction: Functional   Judgement:  Judgement: Normal  Reality Testing:  Reality Testing: Distorted  Insight:  Insight: Gaps  Decision Making:  Decision Making: Normal  Social Functioning  Social Maturity:  Social Maturity: Isolates  Social Judgement:  Social Judgement: Normal  Stress  Stressors:  Stressors: Work  Coping Ability:  Coping Ability: Building surveyor Deficits:     Supports:      Family and Psychosocial History: Family history Marital status: Single Are you sexually active?: No Does patient have children?: Yes How many children?: 1 (31 mo old son; pt states he has no rights to the child  )  Childhood History:  Childhood History By whom was/is the patient raised?: Both parents Additional childhood history information: Born in [place], [place].  States father was very strict.  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  Pt states he was an average student, but was bullied due to being a red head.  States his older brother bullied him also.  Pt states he was also in a special speech and reading class. Description of patient's relationship with caregiver when they were a child: Father was authoritarian. Patient's description of current relationship with people who raised him/her: States he gets along better with him now. Does patient have siblings?: Yes Number of Siblings: 4 Description of patient's current relationship with siblings: Closest to older brother now. Did patient suffer any verbal/emotional/physical/sexual abuse as a child?: No Did patient suffer from severe childhood neglect?: No Has patient ever been sexually abused/assaulted/raped as an adolescent or adult?: No Was the patient ever a victim of a crime or a disaster?: No Witnessed domestic violence?: No Has patient been effected by domestic violence as an adult?: No  CCA Part Two B  Employment/Work  Situation: Employment / Work Psychologist, occupational Employment situation: Employed Where is patient currently employed?: [employer] How long has patient been employed?: one yr Patient's job has been impacted by current illness: Yes Describe how patient's job has been impacted:  Been out on STD/FMLA right before Thanksgiving 2016 What is the longest time patient has a held a job?: 14 yrs 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 Has patient ever been in the [employer]?: No Has patient ever served in combat?: No Did You Receive Any Psychiatric Treatment/Services While in Equities trader?: No Are There Guns or Other Weapons in Your Home?: No Are These Comptroller?: Yes  Education: Education Last Grade Completed: 12 Did Garment/textile technologist From [employer]?: Yes Did Theme park manager?: Yes What Type of College Degree Do you Have?: Assoc Degree in Architect Techn Did You Attend Graduate School?: No Did You Have An Individualized Education Program (IIEP): No Did You Have Any Difficulty At School?: No (Average student.)  Religion: Religion/Spirituality Are You A Religious Person?: Yes What is Your Religious Affiliation?: Unknown  Leisure/Recreation: Leisure / Recreation Leisure and Hobbies: basketball, fishing  Exercise/Diet: Exercise/Diet Do You Exercise?: No Have You Gained or Lost A Significant Amount of Weight in the Past Six Months?: No Do You Follow a Special Diet?: No Do You Have Any Trouble Sleeping?: Yes Explanation of Sleeping Difficulties: frequent awakenings  CCA Part Two C  Alcohol/Drug Use: Alcohol / Drug Use Pain Medications: Pt admits to abusing pain medications; after having jaw surgery in 2004.  States he would abuse whatever he could get his hands on.  Last use was three yrs ago. History of alcohol / drug use?: Yes Longest period of sobriety (when/how long): Three yrs. Negative Consequences of Use: Personal relationships, Work / Programmer, multimedia, Armed forces operational officer,  Surveyor, quantity Withdrawal Symptoms: Agitation, Irritability Substance #1 Name of Substance 1: Various pain medications 1 - Age of First Use: 25 1 - Amount (size/oz): unknown 1 - Frequency: daily 1 - Duration: age 77-age [age] 1 - Last Use / Amount: Three yrs ago.                    CCA Part Three  ASAM's:  Six Dimensions of Multidimensional Assessment  Dimension 1:  Acute Intoxication and/or Withdrawal Potential:     Dimension 2:  Biomedical Conditions and Complications:     Dimension 3:  Emotional, Behavioral, or Cognitive Conditions and Complications:     Dimension 4:  Readiness to Change:     Dimension 5:  Relapse, Continued use, or Continued Problem Potential:     Dimension 6:  Recovery/Living Environment:      Substance use Disorder (SUD)    Social Function:  Social Functioning Social Maturity: Isolates Social Judgement: Normal  Stress:  Stress Stressors: Work Coping Ability: Overwhelmed Patient Takes Medications The Way The Doctor Instructed?: No Priority Risk: Moderate Risk  Risk Assessment- Self-Harm Potential: Risk Assessment For Self-Harm Potential Thoughts of Self-Harm: Vague current thoughts Method: No plan Availability of Means: No access/NA Additional Information for Self-Harm Potential: Family History of Suicide  Risk Assessment -Dangerous to Others Potential: Risk Assessment For Dangerous to Others Potential Method: No Plan Availability of Means: No access or NA Intent: Vague intent or NA Notification Required: No need or identified person  DSM5 Diagnoses: Patient Active Problem List   Diagnosis Date Noted  . Vitamin D deficiency 02/14/2015  . Low back pain 10/27/2014  . Fungal infection of toenail 10/14/2014  . Rhomboid muscle strain 09/20/2014  . Chest wall pain 07/27/2014  . Chest wall tenderness 07/19/2014  . Shortness of breath 07/19/2014  . Thoracic back pain 05/21/2013  . Low back strain 05/21/2013  . Acute bronchitis 10/19/2012   .  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 Left leg pain 09/13/2010  . DVT (deep venous thrombosis) (HCC) 07/06/2010  . History of patellar fracture 07/06/2010  . HYPERCHOLESTEROLEMIA 10/13/2009  . PREDIABETES 10/13/2009  . ANXIETY 06/02/2006  . TOBACCO ABUSE 06/02/2006  . DEPRESSION 06/02/2006  . PAIN, CHRONIC NEC 06/02/2006    Patient Centered Plan: Patient is on the following Treatment Plan(s):  Anxiety and Depression  Recommendations for Services/Supports/Treatments: Recommendations for Services/Supports/Treatments Recommendations For Services/Supports/Treatments: IOP (Intensive Outpatient Program)  Treatment Plan Summary:  pt will attend [facility] daily to participate in group therapy and learn coping skills in psycho-educational groups.  Refer pt back to Dr. Evelene Croon and Hurley Cisco, LCSW once completes.  Referrals to Alternative Service(s): Referred to Alternative Service(s):   Place:   Date:   Time:    Referred to Alternative Service(s):   Place:   Date:   Time:    Referred to Alternative Service(s):   Place:   Date:   Time:    Referred to Alternative Service(s):   Place:   Date:   Time:     Chanceler Pullin, RITA, M.Ed, CNA

## 2015-02-16 ENCOUNTER — Other Ambulatory Visit (HOSPITAL_COMMUNITY): Payer: Managed Care, Other (non HMO) | Admitting: *Deleted

## 2015-02-16 DIAGNOSIS — F331 Major depressive disorder, recurrent, moderate: Secondary | ICD-10-CM

## 2015-02-16 MED ORDER — CYCLOBENZAPRINE HCL 5 MG PO TABS: ORAL_TABLET | ORAL | Status: DC

## 2015-02-16 NOTE — Progress Notes (Signed)
Daily Group Progress Note  Program: IOP  Group Time: 9am to 10 am  Participation Level: Active  Behavioral Response: Appropriate, Sharing and Attentive  Type of Therapy:  Psycho-education Group  Summary of Progress: The first session of toady's program was devoted to psycho education in effort to explore how best to increase  awareness of thought patterns. Examples included addresses each of the senses and easy quest that can help one practice mindfulness.  Patient shared that when he begins to become aware of his negative thoughts and/or paranoia then  They often increase becoming to overwhelming. This was good segway for pointing out where self care can be implemented.     Group Time: 10:25 to 12 noon  Participation Level:  Active  Behavioral Response: Appropriate and Attentive  Type of Therapy: Process Group  Summary of Progress: Latter portion of today's programming included a short (9 minute)  film on self compassion and processing exercise to explore differences in how we deal with ourselves compared to how we deal with others in midst of a struggle or difficult time. Patient was attentive to film and participated in exercise citing his responses as "polar opoisites." Patient was attentive to discussion by others resistant to incoporporating daily practice of self care. Patient agreeable to practicing 1 minutes self care tool twice today.   Carney Bern, LCSW

## 2015-02-17 ENCOUNTER — Encounter (HOSPITAL_COMMUNITY): Payer: Self-pay | Admitting: Psychiatry

## 2015-02-17 ENCOUNTER — Other Ambulatory Visit (INDEPENDENT_AMBULATORY_CARE_PROVIDER_SITE_OTHER): Payer: Managed Care, Other (non HMO) | Admitting: Psychiatry

## 2015-02-17 VITALS — BP 175/114 | HR 84 | Resp 12 | Ht 78.0 in | Wt 229.6 lb

## 2015-02-17 DIAGNOSIS — F251 Schizoaffective disorder, depressive type: Secondary | ICD-10-CM

## 2015-02-17 DIAGNOSIS — M549 Dorsalgia, unspecified: Secondary | ICD-10-CM

## 2015-02-17 DIAGNOSIS — F39 Unspecified mood [affective] disorder: Secondary | ICD-10-CM | POA: Diagnosis not present

## 2015-02-17 DIAGNOSIS — F331 Major depressive disorder, recurrent, moderate: Secondary | ICD-10-CM | POA: Diagnosis not present

## 2015-02-17 DIAGNOSIS — G8929 Other chronic pain: Secondary | ICD-10-CM

## 2015-02-17 DIAGNOSIS — F411 Generalized anxiety disorder: Secondary | ICD-10-CM

## 2015-02-17 MED ORDER — ARIPIPRAZOLE 5 MG PO TABS: ORAL_TABLET | ORAL | Status: DC

## 2015-02-17 NOTE — Progress Notes (Signed)
    Daily Group Progress Note  Program: IOP  Group Time: 9:00-10:30  Participation Level: Active  Behavioral Response: Appropriate  Type of Therapy:  Group Therapy  Summary of Progress: Pt. Completed intake assessment with  Case manager.      Group Time: 10:30-12:00  Participation Level:  Active  Behavioral Response: Appropriate  Type of Therapy: Psycho-education Group  Summary of Progress: Pt. Presented as quite, not sharing in group, but attentive to group process. Pt. Left group to meet with case manager.  Shaune Pollack, LPC

## 2015-02-17 NOTE — Progress Notes (Signed)
Psychiatric Initial Adult Assessment   Patient Identification: Jeffrey Lin MRN:  [id number] Date of Evaluation:  02/17/2015 Referral Source: Dr Evelene Croon Chief Complaint: " I have real bad anxiety attacks;has hallucinations-hear voices ,isolate.I'm on FMLA and short term disability and need to come here. Dr Evelene Croon thought I needed more than 1:1 counseling I needed a group." Visit Diagnosis:    ICD-9-CM ICD-10-CM   1. 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 Anxiety state 300.00 F41.1   3. Chronic back pain 724.5 M54.9    338.29 G89.29   4. Schizoaffective disorder, depressive type (HCC) 295.70 F25.1    Diagnosis:   Patient Active Problem List   Diagnosis Date Noted  . Schizoaffective disorder, depressive type (HCC) [F25.1] 02/17/2015  . Vitamin D deficiency [E55.9] 02/14/2015  . Low back pain [M54.5] 10/27/2014  . Fungal infection of toenail [B35.1] 10/14/2014  . Rhomboid muscle strain [S29.012A] 09/20/2014  . Chest wall pain [R07.89] 07/27/2014  . Chest wall tenderness [R07.89] 07/19/2014  . Shortness of breath [R06.02] 07/19/2014  . Thoracic back pain [M54.6] 05/21/2013  . Low back strain [S39.012A] 05/21/2013  . Acute bronchitis [J20.9] 10/19/2012  . Psychotic mood disorder (HCC) [F39] 05/14/2012  . Left leg pain [M79.605] 09/13/2010  . DVT (deep venous thrombosis) (HCC) [I82.409] 07/06/2010  . History of patellar fracture [Z87.81] 07/06/2010  . HYPERCHOLESTEROLEMIA [E78.00] 10/13/2009  . PREDIABETES [R73.09] 10/13/2009  . Anxiety state [F41.1] 06/02/2006  . TOBACCO ABUSE [F17.200] 06/02/2006  . DEPRESSION [F32.9] 06/02/2006  . Chronic back pain [M54.9, G89.29] 06/02/2006   History of Present Illness: 40 yr old single, employed, Caucasian male, who was referred per Dr. Evelene Croon; treatment for ongoing depressive and anxiety symptoms.  Admits to passive SI; denies a plan or intent.  Able to contract for safety.  Admits to daily panic attacks.  According to pt, he has been  depressed since age 68.  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  All are African American except one, "but she's married to a black man."  Pt states it has nothing to do with their race, but there's a lot of conflict there.  "They are always keeping something stirred up."  Pt states the company will be closing March 31st and sending all their work to the [place].  Pt states he has been admitted on a psychiatric unit twice [facility] and [facility]).  Pt has been seeing Dr. Evelene Croon for seven years and just recently started with Hurley Cisco, LCSW.  Denies any prior suicide attempts or gestures.  Family Hx:  Maternal GF (Bipolar; Schizophrenia:  Committed suicide); Mother and Brother (Depression & Anxiety)  Patients Currently Reported Symptoms/Problems: decreased motivation, no energy, anhedonia, indecisiveness, awakenings at night, ruminating thoughts, irritability, tearfulness, paranoid thoughts (feeling people are watching him) Jeffrey Lin reports the hallucinations began .3 years ago with known precipitating factor. He denies drug use except for episodic opioid abuse from 2004 to 2013/4. Admits to social use of alcohol only.Lives with his parents and Charity fundraiser in his room where he feels safest.He senses someone is always behind him.The voices do not command.He has seen doors open and close.The other night her heard shouting outside house and got his gun and headed out the door until his parents told him there was no shouting.Reports he was angered in Group today as Pharmacist, community shared about her husband and he had feelings to defend/avenge her.  Associated Signs/Symptoms                                                                                                                                                                                                                                                               Decreased motivation: decreased motivation, no energy, anhedonia, in.  Family Hx:  Maternal GF (Bipolar; Schizophrenia:  Committed suicide); Mother and Brother (Depression & Anxiety)  Reported Symptoms/Problems: decreased motivation, no energy, anhedonia, indecisiveness, awakenings at night, ruminating thoughts, irritability, tearfulness, paranoid thoughts (feeling people are watching him) Collateral Involvement: Mother and brother are supportive. Individual's Strengths: Pt is motivated for treatment.  Childhood History:   Childhood History By whom was/is the patient raised?: Both parents Additional childhood history information: Born in [place], [place].  States father was very strict.  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  Pt states he was an average student, but was bullied due to being a red head.  States his older brother bullied him also.  Pt states he was also in a special speech and reading class. Description of patient's relationship with caregiver when they were a child: Father was authoritarian. Patient's description of current relationship with people who raised him/her: States he gets along better with him now. Does patient have siblings?: Yes Number of Siblings: 4 Description of patient's current relationship with siblings: Closest to older brother now. Did patient suffer any  verbal/emotional/physical/sexual abuse as a child?: No Did patient suffer from severe childhood neglect?: No Has patient ever been sexually abused/assaulted/raped as an adolescent or adult?: No Was the patient ever a victim of a crime or a disaster?: No Witnessed domestic violence?: No Has patient been effected by domestic violence as an adult?: No  Depression Symptoms:  See PHQ 9 Score = 10 Mild depression (Hypo) Manic Symptoms:  None Anxiety Symptoms:  Agoraphobia NO Excessive Worry,yes paranoia Panic Symptoms,Yes on Xanax and klonopin Obsessive Compulsive Symptoms:   None,, Social Anxiety,Yes paranoid Specific Phobias None GAD 7-Score 18/21 Severe anxiety making it very difficult for him to function  Psychotic Symptoms:  Delusions No Hallucinations: Auditory Visual Started age 2 spontaneous Ideas of Reference,None Paranoia,yes-feels like someone is always behind me /GAD 7 score reflects this as well PTSD Symptoms:NA Past Medical History:  Past Medical History  Diagnosis Date  . Chronic leg pain   . Anxiety   . Depression     Past Surgical History  Procedure Laterality Date  . 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 Bone graft from hip to jaw  2004  . Bone graft right side of head to jaw  2006  . Sphenopalatine ganglionic    . Block procedure at pain center  03/27/06   Family History:  Family History  Problem Relation Age of Onset  . Hyperlipidemia Mother   . Hypertension Mother   . Depression Mother   . Anxiety disorder Mother   . Depression Brother   . Anxiety disorder Brother   . Bipolar disorder Maternal Grandfather   . Schizophrenia Maternal Grandfather    Social History:   Social History   Social History  . Marital Status: Single    Spouse Name: N/A  . Number of Children: 0  . Years of Education: N/A   Occupational History  . Machine Shop Tool and Scientist, forensic    Social History Main Topics  . Smoking status: Current Every Day Smoker -- 1.00  packs/day for 10 years    Types: Cigarettes  . Smokeless tobacco: Former Neurosurgeon  . Alcohol Use: No  . Drug Use: No  . Sexual Activity: Not Currently   Other Topics Concern  . None   Social History Narrative   In relationship, + sex, + condom   Exercise:  None, running causes  head pain   Diet:  FF once daily, + fruits and veggies, + H2O, + Soda   Additional Social History:   Family and Psychosocial History: Family history Marital status: Single Are you sexually active?: No Does patient have children?: Yes How many children?: 1 (60 mo old son; pt states he has no rights to the child  )  Education: Education Last Grade Completed: 12 Did Garment/textile technologist From [employer]?: Yes Did Theme park manager?: Yes What Type of College Degree Do you Have?: Assoc Degree in Architect Techn Did You Attend Graduate School?: No Did You Have An Individualized Education Program (IIEP): No Did You Have Any Difficulty At School?: No (Average student.)  Religion: Religion/Spirituality Are You A Religious Person?: Yes What is Your Religious Affiliation?: Unknown  Leisure/Recreation: Leisure / Recreation Leisure and Hobbies: basketball, fishing,Watercolor painting  Exercise/Diet: Exercise/Diet Do You Exercise?: No Have You Gained or Lost A Significant Amount of Weight in the Past Six Months?: No Do You Follow a Special Diet?: No Do You Have Any Trouble Sleeping?: Yes Explanation of Sleeping Difficulties: frequent awakenings   Alcohol/Drug Use: Alcohol / Drug Use Pain Medications: Pt admits to abusing pain medications; after having jaw surgery in 2004.  States he would abuse whatever he could get his hands on.  Last use was three yrs ago. History of alcohol / drug use?: Yes Longest period of sobriety (when/how long): Three yrs. Negative Consequences of Use: Personal relationships, Work / Programmer, multimedia, Armed forces operational officer, Surveyor, quantity Withdrawal Symptoms: Agitation, Irritability Substance #1 Name of Substance 1:  Various pain medications 1 - Age of First Use: 25 1 - Amount (size/oz): unknown 1 - Frequency: daily 1 - Duration: age 34-age [age] 1 - Last Use / Amount: Three yrs ago.    Musculoskeletal: Strength & Muscle Tone: within normal limits Gait & Station: normal Patient leans: N/A  Psychiatric Specialty Exam: HPISee above  Review of Systems  Constitutional: Negative.  Negative for fever, chills, weight loss, malaise/fatigue and diaphoresis.  HENT: Negative.  Negative for congestion, ear discharge, ear pain, hearing loss, nosebleeds, sore throat and tinnitus.   Eyes: Negative.  Negative for blurred vision, double vision, photophobia, pain, discharge and redness.  Respiratory: Negative.  Negative for cough, hemoptysis, sputum production, shortness of breath, wheezing and stridor.   Neurological: Negative for dizziness, tingling, tremors, sensory change, speech change, focal weakness, seizures, loss of consciousness, weakness and headaches.  Psychiatric/Behavioral: Positive for depression and hallucinations. Negative for suicidal ideas (Has fleeting thought rarely), memory loss and substance abuse (neg past 3 years). The patient is nervous/anxious and has insomnia.   All other systems reviewed and are negative.   Blood pressure 175/114, pulse 84, resp. rate 12, height 6\' 6"  (1.981 m), weight 229 lb 9.6 oz (104.146 kg).Body mass index is 26.54 kg/(m^2).  General Appearance: Fairly Groomed  Eye Contact:  Good  Speech:  Clear and Coherent and Slow  Volume:  Normal  Mood:  Anxious and Dysphoric  Affect:  Non-Congruent and Flat  Thought Process:  Coherent and Intact  Orientation:  Negative  Thought Content:  Hallucinations: Auditory Visual  Suicidal Thoughts:  No  Homicidal Thoughts:  No  Memory:  Negative  Judgement:  Other:  can be impaired by hallucination  Insight:  Lacking  Psychomotor Activity:  Normal  Concentration:  Negative  Recall:  Good  Fund of Knowledge:Fair  Language:  Fair  Akathisia:  NA  Handed:  Right  AIMS (if indicated):  NA  Assets:  Desire for Improvement  Financial Resources/Insurance Housing Resilience Social Support Transportation  ADL's:  Intact  Cognition: WNL and Impaired,  Moderate at times with hallucination  Sleep:  Troubled   Is the patient at risk to self?  No. Has the patient been a risk to self in the past 6 months?  No. Has the patient been a risk to self within the distant past?  Yes.   Is the patient a risk to others?  No. Has the patient been a risk to others in the past 6 months?  No. Has the patient been a risk to others within the distant past?  No.  Allergies:  No Known Allergies Current Medications: Current Outpatient Prescriptions  Medication Sig Dispense Refill  . ALPRAZolam (XANAX XR) 2 MG 24 hr tablet Take 2 mg by mouth every morning.  1  . ARIPiprazole (ABILIFY) 5 MG tablet Take 1 tablet at bedtime for 1 week then increase to 2 tablets 60 tablet 0  . atorvastatin (LIPITOR) 40 MG tablet Take 1 tablet (40 mg total) by mouth daily. 90 tablet 3  . clonazePAM (KLONOPIN) 1 MG tablet Take 1 mg by mouth 2 (two) times daily.   3  . cyclobenzaprine (FLEXERIL) 5 MG tablet TAKE 2 TABLETS (10 MG TOTAL) BY MOUTH 3 (THREE) TIMES DAILY AS NEEDED FOR MUSCLE SPASMS. 180 tablet 3  . diclofenac (VOLTAREN) 75 MG EC tablet TAKE 1 TABLET (75 MG TOTAL) BY MOUTH 2 (TWO) TIMES DAILY. With food. 60 tablet 2  . gabapentin (NEURONTIN) 600 MG tablet Take 1,200-1,800 mg by mouth See admin instructions. Take either two tablets ( ) three times a day or three tablets ( ) two times a day.    Marland Kitchen PRISTIQ 100 MG 24 hr tablet     . Vitamin D, Ergocalciferol, (DRISDOL) 50000 units CAPS capsule     . Vitamin D, Ergocalciferol, (DRISDOL) 50000 units CAPS capsule Take 1 capsule (50,000 Units total) by mouth every 7 (seven) days. 12 capsule 0   No current facility-administered medications for this visit.    Previous Psychotropic Medications:  Yes   Substance Abuse History in the last 12 months:  No.  Consequences of Substance Abuse: NA  Medical Decision Making:  Established Problem, Stable/Improving (1), Review of Psycho-Social Stressors (1), Review and summation of old records (2), Review of Medication Regimen & Side Effects (2) and Review of New Medication or Change in Dosage (2)  Treatment Plan Summary: Medication management and [facility]  Psy IOP Protocol;Trial of Abillify HS    Maryjean Morn 1/27/20172:43 PM
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
## 2015-02-20 ENCOUNTER — Other Ambulatory Visit (HOSPITAL_COMMUNITY): Payer: Managed Care, Other (non HMO) | Admitting: Psychiatry

## 2015-02-20 ENCOUNTER — Telehealth (HOSPITAL_COMMUNITY): Payer: Self-pay

## 2015-02-20 DIAGNOSIS — F251 Schizoaffective disorder, depressive type: Secondary | ICD-10-CM

## 2015-02-20 DIAGNOSIS — F331 Major depressive disorder, recurrent, moderate: Secondary | ICD-10-CM | POA: Diagnosis not present

## 2015-02-20 DIAGNOSIS — F39 Unspecified mood [affective] disorder: Secondary | ICD-10-CM

## 2015-02-20 DIAGNOSIS — F411 Generalized anxiety disorder: Secondary | ICD-10-CM

## 2015-02-20 MED ORDER — ARIPIPRAZOLE 10 MG PO TABS: ORAL_TABLET | ORAL | Status: DC

## 2015-02-20 NOTE — Telephone Encounter (Signed)
Medication problem - Fax received from patient's [facility] that patient's insurance would not cover Abilify 5 mg due to quantity limit.  Met with Darlyne Russian, PA-C to agree to send a new order for Abilify 5m, 1/2 for 1 wk and then 1  a day.  New order for Abilify 10 mg, 1/2 at bedtime for 1 week and then 1 tablet at bedtime there after [place] [facility] [place] by CDarlyne Russian PA-C.

## 2015-02-20 NOTE — Progress Notes (Signed)
    Daily Group Progress Note  Program: IOP  Group Time: 9:00-10:30  Participation Level: Active  Behavioral Response: Appropriate  Type of Therapy:  Psycho-education Group  Summary of Progress: Pt. Participated in medication management group with Michelle Nasuti.     Group Time: 10:30-12:00  Participation Level:  Active  Behavioral Response: Appropriate  Type of Therapy: Group Therapy  Summary of Progress: Pt. Presented as talkative and engaged in the group process. Pt. Discussed his history of prescription pill abuse and alcohol dependency and getting sober without treatment. Pt. Discussed emptiness that he has felt desiring his parent's unconditional acceptance of his life and feeling inadequate as compared to his brothers.   Shaune Pollack, LPC

## 2015-02-20 NOTE — Telephone Encounter (Signed)
Telephone message left for patient his Abilify new order was changed to Abilify 10 mg, 0.5 tablets at bedtime for one week and then increase to 1 tablet at bedtime, #30 with no refills.  Requested patient call back if any questions about change.

## 2015-02-21 ENCOUNTER — Other Ambulatory Visit (HOSPITAL_COMMUNITY): Payer: Managed Care, Other (non HMO) | Admitting: Psychiatry

## 2015-02-21 DIAGNOSIS — F251 Schizoaffective disorder, depressive type: Secondary | ICD-10-CM

## 2015-02-21 DIAGNOSIS — F331 Major depressive disorder, recurrent, moderate: Secondary | ICD-10-CM | POA: Diagnosis not present

## 2015-02-22 ENCOUNTER — Other Ambulatory Visit (HOSPITAL_COMMUNITY): Payer: 59 | Attending: Psychiatry | Admitting: Psychiatry

## 2015-02-22 DIAGNOSIS — F17210 Nicotine dependence, cigarettes, uncomplicated: Secondary | ICD-10-CM | POA: Insufficient documentation

## 2015-02-22 DIAGNOSIS — F251 Schizoaffective disorder, depressive type: Secondary | ICD-10-CM | POA: Insufficient documentation

## 2015-02-22 NOTE — Progress Notes (Signed)
    Daily Group Progress Note  Program: IOP  Group Time: 9:00-10:30  Participation Level: Active  Behavioral Response: Appropriate  Type of Therapy:  Group Therapy  Summary of Progress: Pt. Presented as talkative, engaged in group process. Pt. Discussed history of feeling that he has disappointed his parents because of his relationship history and history of drug and alcohol dependence.     Group Time: 10:30-12:00  Participation Level:  Active  Behavioral Response: Appropriate  Type of Therapy: Psycho-education Group  Summary of Progress: Pt. Participated in grounding series and yoga series for anxiety and depression, instruction about use of bilateral tapping for management of panic and anxiety episodes.  Shaune Pollack, LPC

## 2015-02-23 ENCOUNTER — Other Ambulatory Visit (HOSPITAL_COMMUNITY): Payer: 59 | Admitting: Psychiatry

## 2015-02-23 DIAGNOSIS — F251 Schizoaffective disorder, depressive type: Secondary | ICD-10-CM | POA: Diagnosis not present
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
## 2015-02-24 ENCOUNTER — Other Ambulatory Visit (HOSPITAL_COMMUNITY): Payer: 59 | Admitting: Psychiatry

## 2015-02-24 DIAGNOSIS — F251 Schizoaffective disorder, depressive type: Secondary | ICD-10-CM | POA: Diagnosis not present

## 2015-02-24 NOTE — Progress Notes (Signed)
    Daily Group Progress Note  Program: IOP  Group Time: 9:00-10:30  Participation Level: Active  Behavioral Response: Appropriate  Type of Therapy:  Group Therapy  Summary of Progress: Pt. Presents as talkative, engaged in group process, providing thoughtful feedback to group members. Pt. Brought his adult coloring books to group and shared pages with group members. The coloring books have been helpful for him and he wanted to share the coping strategy with the group.      Group Time: 10:30-12:00  Participation Level:  Active  Behavioral Response: Appropriate  Type of Therapy: Psycho-education Group  Summary of Progress: Pt. Participated in breathing exercise and guided meditation with body scan.  Shaune Pollack, LPC

## 2015-02-27 ENCOUNTER — Other Ambulatory Visit (HOSPITAL_COMMUNITY): Payer: 59 | Admitting: Psychiatry

## 2015-02-27 DIAGNOSIS — F251 Schizoaffective disorder, depressive type: Secondary | ICD-10-CM | POA: Diagnosis not present

## 2015-02-27 NOTE — Progress Notes (Signed)
    Daily Group Progress Note  Program: IOP  Group Time: 9:00-10:30  Participation Level: Active  Behavioral Response: Appropriate  Type of Therapy:  Group Therapy  Summary of Progress: Pt. Presents as alert, attentive, actively listens in group. Pt. Processes internally, very observant to the group process. Pt. Discusses family relationships, loss associated with relationships, and desiring acceptance from his family.     Group Time: 10:30-12:00  Participation Level:  Active  Behavioral Response: Appropriate  Type of Therapy: Psycho-education Group  Summary of Progress: Pt. Participated in grief and loss group with Theda Belfast.  Shaune Pollack, LPC

## 2015-02-27 NOTE — Progress Notes (Signed)
    Daily Group Progress Note  Program: IOP  Group Time: 9:00-10:30  Participation Level: Active  Behavioral Response: Appropriate  Type of Therapy:  Psycho-education Group  Summary of Progress: Pt. Participated in medication management group with Michelle Nasuti.     Group Time: 10:30-12:00  Participation Level:  Active  Behavioral Response: Appropriate  Type of Therapy: Group Therapy  Summary of Progress: Pt. Continues to present with primarily flat affect, smiles at times during group appropriately. Pt. Makes appropriate and supportive connection with other group members. Pt. Reported that he was "doing ok".  Shaune Pollack, LPC

## 2015-02-28 ENCOUNTER — Other Ambulatory Visit (HOSPITAL_COMMUNITY): Payer: 59 | Admitting: Psychiatry

## 2015-02-28 DIAGNOSIS — F331 Major depressive disorder, recurrent, moderate: Secondary | ICD-10-CM

## 2015-02-28 MED ORDER — ARIPIPRAZOLE 30 MG PO TABS
30.0000 mg | ORAL_TABLET | Freq: Every day | ORAL | Status: DC
Start: 2015-02-28 — End: 2015-08-11

## 2015-02-28 NOTE — Progress Notes (Signed)
30 mg for the voices and seeing things as the 10 mg was not enough.

## 2015-03-01 ENCOUNTER — Other Ambulatory Visit (HOSPITAL_COMMUNITY): Payer: 59 | Admitting: Psychiatry

## 2015-03-01 DIAGNOSIS — F251 Schizoaffective disorder, depressive type: Secondary | ICD-10-CM | POA: Diagnosis not present

## 2015-03-01 DIAGNOSIS — F331 Major depressive disorder, recurrent, moderate: Secondary | ICD-10-CM

## 2015-03-02 ENCOUNTER — Other Ambulatory Visit (HOSPITAL_COMMUNITY): Payer: 59 | Admitting: Psychiatry

## 2015-03-02 DIAGNOSIS — F331 Major depressive disorder, recurrent, moderate: Secondary | ICD-10-CM

## 2015-03-02 DIAGNOSIS — F251 Schizoaffective disorder, depressive type: Secondary | ICD-10-CM | POA: Diagnosis not present

## 2015-03-02 NOTE — Progress Notes (Signed)
    Daily Group Progress Note  Program: IOP  Group Time: 9:00-10:30  Participation Level: Active  Behavioral Response: Appropriate  Type of Therapy:  Group Therapy  Summary of Progress: Pt. Presented as relaxed, engaged in group process. Pt. Provided his thoughts about money management, ability to live simply. Pt. Discussed the importance of his values about money and wanting to contribute to his family's budget.     Group Time: 10:30-12:00  Participation Level:  Active  Behavioral Response: Appropriate  Type of Therapy: Group Therapy  Summary of Progress: Pt. Participated in discussion about patterns of control/manipulation and emotional abuse in relationships i.e., isolating from family and friends, gaslighting, financial dependence, and fear of abandonment.  Boneta Lucks, Ph.D., [facility]

## 2015-03-02 NOTE — Progress Notes (Signed)
    Daily Group Progress Note  Program: IOP  Group Time: 9:00-10:30  Participation Level: Active  Behavioral Response: Appropriate  Type of Therapy:  Group Therapy  Summary of Progress: Pt. Continues to present with brightened affect, relaxed and quiet for most of the group, but actively listening and engaged in the group process. Pt. Receives good feedback from the group, his confidence has grown in the group setting.     Group Time: 10:30-12:00  Participation Level:  Active  Behavioral Response: Appropriate  Type of Therapy: Psycho-education Group  Summary of Progress: Pt. Participated in instruction about grounding series and meditation using the RAIN method, i.e., recognizing, allowing, investigating, and non-identification.  Shaune Pollack, LPC

## 2015-03-03 ENCOUNTER — Other Ambulatory Visit (HOSPITAL_COMMUNITY): Payer: 59 | Admitting: Psychiatry

## 2015-03-03 DIAGNOSIS — F251 Schizoaffective disorder, depressive type: Secondary | ICD-10-CM | POA: Diagnosis not present

## 2015-03-03 DIAGNOSIS — F331 Major depressive disorder, recurrent, moderate: Secondary | ICD-10-CM

## 2015-03-03 NOTE — Progress Notes (Signed)
    Daily Group Progress Note  Program: IOP  Group Time: 9:0-10:30  Participation Level: Active  Behavioral Response: Appropriate  Type of Therapy:  Group Therapy  Summary of Progress: Pt. Presented as talkative, engaged in group process. Participated in discussion about pressure that he feels from his family, feeling compared to his brothers and their achievements. Pt. Discussed feeling anger and was able to discuss thoughts that trigger his feelings of anger. Pt. Reported that he frequently feels overwhelmed or that he does not have the resources to solve problems he feels anger. Pt. Was encouraged by the group not to judge his thoughts and to recognize his anger as information about issues that he feels loss of control in his life.     Group Time: 10:30-12:00  Participation Level:  Active  Behavioral Response: Appropriate  Type of Therapy: Psycho-education Group  Summary of Progress: Pt. Participated in discussion about development of self and seeking support from others.  Shaune Pollack, LPC

## 2015-03-06 ENCOUNTER — Other Ambulatory Visit (HOSPITAL_COMMUNITY): Payer: 59 | Admitting: Psychiatry

## 2015-03-06 DIAGNOSIS — F251 Schizoaffective disorder, depressive type: Secondary | ICD-10-CM | POA: Diagnosis not present

## 2015-03-06 DIAGNOSIS — F331 Major depressive disorder, recurrent, moderate: Secondary | ICD-10-CM

## 2015-03-06 NOTE — Progress Notes (Signed)
Daily Group Progress Note  Program: IOP  Group Time: 9:00-10:30  Participation Level: Active  Behavioral Response: Appropriate  Type of Therapy:  Psycho-education Group  Summary of Progress: Pt. Participated in medication management session with Mason.     Group Time: 10:30-12:00  Participation Level:  Active  Behavioral Response: Appropriate  Type of Therapy: Group Therapy  Summary of Progress: Pt. Presented as alert and engaged in the group process. Pt. Shared feedback for other group members regarding asking for and having emotional needs met in relationship.  Nancie Neas, LPC

## 2015-03-07 ENCOUNTER — Other Ambulatory Visit (HOSPITAL_COMMUNITY): Payer: 59 | Admitting: Psychiatry

## 2015-03-07 DIAGNOSIS — F251 Schizoaffective disorder, depressive type: Secondary | ICD-10-CM | POA: Diagnosis not present

## 2015-03-07 DIAGNOSIS — F331 Major depressive disorder, recurrent, moderate: Secondary | ICD-10-CM

## 2015-03-07 NOTE — Progress Notes (Signed)
    Daily Group Progress Note  Program: IOP  Group Time: 9:00-10:30  Participation Level: Active  Behavioral Response: Appropriate  Type of Therapy:  Group Therapy  Summary of Progress: Pt. Presented as primarily quiet, engaged in the group process, talked and laughed appropriately. Pt. Shared pattern of wanting to please his father which resulted in anger and frustration. Pt. Participated in extended group therapy session processing Pt. Experiences with relationship boundaries, guilt and shame related to communicating personal needs for others, patterns of excessive caregiving and tendencies to "save" others in relationships.       Shaune Pollack, LPC

## 2015-03-08 ENCOUNTER — Other Ambulatory Visit (HOSPITAL_COMMUNITY): Payer: 59 | Admitting: Psychiatry

## 2015-03-08 DIAGNOSIS — F251 Schizoaffective disorder, depressive type: Secondary | ICD-10-CM | POA: Diagnosis not present

## 2015-03-08 DIAGNOSIS — F331 Major depressive disorder, recurrent, moderate: Secondary | ICD-10-CM

## 2015-03-09 ENCOUNTER — Emergency Department (HOSPITAL_COMMUNITY)
Admission: EM | Admit: 2015-03-09 | Discharge: 2015-03-09 | Disposition: A | Discharge status: Home / Self Care | Payer: Managed Care, Other (non HMO) | Attending: Emergency Medicine | Admitting: Emergency Medicine

## 2015-03-09 ENCOUNTER — Other Ambulatory Visit (HOSPITAL_COMMUNITY): Payer: Managed Care, Other (non HMO) | Attending: Psychiatry | Admitting: Psychiatry

## 2015-03-09 ENCOUNTER — Encounter (HOSPITAL_COMMUNITY): Payer: Self-pay | Admitting: *Deleted

## 2015-03-09 DIAGNOSIS — F259 Schizoaffective disorder, unspecified: Secondary | ICD-10-CM | POA: Insufficient documentation

## 2015-03-09 DIAGNOSIS — Z791 Long term (current) use of non-steroidal anti-inflammatories (NSAID): Secondary | ICD-10-CM | POA: Diagnosis not present

## 2015-03-09 DIAGNOSIS — F329 Major depressive disorder, single episode, unspecified: Secondary | ICD-10-CM | POA: Insufficient documentation

## 2015-03-09 DIAGNOSIS — F331 Major depressive disorder, recurrent, moderate: Secondary | ICD-10-CM

## 2015-03-09 DIAGNOSIS — M62830 Muscle spasm of back: Secondary | ICD-10-CM | POA: Diagnosis not present

## 2015-03-09 DIAGNOSIS — M546 Pain in thoracic spine: Secondary | ICD-10-CM | POA: Diagnosis present

## 2015-03-09 DIAGNOSIS — M549 Dorsalgia, unspecified: Secondary | ICD-10-CM

## 2015-03-09 DIAGNOSIS — Z79899 Other long term (current) drug therapy: Secondary | ICD-10-CM | POA: Diagnosis not present

## 2015-03-09 DIAGNOSIS — F17210 Nicotine dependence, cigarettes, uncomplicated: Secondary | ICD-10-CM | POA: Insufficient documentation

## 2015-03-09 DIAGNOSIS — R109 Unspecified abdominal pain: Secondary | ICD-10-CM | POA: Diagnosis not present

## 2015-03-09 DIAGNOSIS — G8929 Other chronic pain: Secondary | ICD-10-CM | POA: Diagnosis not present

## 2015-03-09 DIAGNOSIS — F419 Anxiety disorder, unspecified: Secondary | ICD-10-CM | POA: Insufficient documentation

## 2015-03-09 LAB — URINALYSIS, ROUTINE W REFLEX MICROSCOPIC
BILIRUBIN URINE: NEGATIVE
Glucose, UA: NEGATIVE mg/dL
Hgb urine dipstick: NEGATIVE
Ketones, ur: NEGATIVE mg/dL
LEUKOCYTES UA: NEGATIVE
NITRITE: NEGATIVE
Protein, ur: NEGATIVE mg/dL
SPECIFIC GRAVITY, URINE: 1.006 (ref 1.005–1.030)
pH: 6.5 (ref 5.0–8.0)

## 2015-03-09 MED ORDER — METHOCARBAMOL 500 MG PO TABS: 500.0000 mg | ORAL_TABLET | Freq: Three times a day (TID) | ORAL | Status: DC | PRN

## 2015-03-09 MED ORDER — KETOROLAC TROMETHAMINE 30 MG/ML IJ SOLN
30.0000 mg | Freq: Once | INTRAMUSCULAR | Status: AC
Start: 2015-03-09 — End: 2015-03-09
  Administered 2015-03-09: 30 mg via INTRAMUSCULAR
  Filled 2015-03-09: qty 1

## 2015-03-09 NOTE — ED Notes (Addendum)
Patient reports right mid sided pain since Monday, no injury noted. No hx of kidney stones. No blood in urine or pain with urination. Patient is on medication at this time for chronic pain.

## 2015-03-09 NOTE — ED Notes (Signed)
PA at bedside.

## 2015-03-09 NOTE — ED Provider Notes (Signed)
CSN: [id number]     Arrival date & time 03/09/15  2040 History  By signing my name below, I, Tanda Rockers, attest that this documentation has been prepared under the direction and in the presence of [address], [employer]. Electronically Signed: Tanda Rockers, ED Scribe. 03/09/2015. 9:11 PM.   Chief Complaint  Patient presents with  . Back Pain   Patient is a 40 y.o. male presenting with back pain. The history is provided by the patient and medical records. No language interpreter was used.  Back Pain Location:  Thoracic spine (right sided) Quality: dull. Radiates to:  Does not radiate Pain severity:  Severe (10/10) Pain is:  Same all the time Onset quality:  Gradual Duration:  4 days Timing:  Constant Progression:  Unchanged Chronicity:  Chronic (acute on chronic) Context: not falling, not lifting heavy objects, not physical stress, not recent injury and not twisting   Relieved by:  Nothing Worsened by:  Movement, sitting, standing and lying down Ineffective treatments:  Muscle relaxants and NSAIDs (Voltaren) Associated symptoms: no abdominal pain, no bladder incontinence, no bowel incontinence, no chest pain, no dysuria, no fever, no numbness, no paresthesias, no perianal numbness and no weakness   Risk factors: no hx of cancer      HPI Comments: Jeffrey Lin is a 40 y.o. male with a PMHx of chronic leg and back pain, anxiety, and depression, who presents to the Emergency Department complaining of gradual onset, constant, 10/10, dull ("like a knee pushing into his back"), nonradiating right flank/thoracic back pain x 4 days. The pain is exacerbated with walking, prolonged sitting, prolonged standing, laying down, and movement. He has been taking Voltaren and Flexeril without relief. No recent heavy lifting, twisting, bending, or injury to the back. No recent travel or leg swelling. No hx kidney stones or kidney issues. Pt does have hx of upper and lower back issues, has been seen  by his PCP for this in the past, and chart review recent visits in Aug and Oct 2016 for very similar complaints, but states he has never had pain in this area before despite what chart review seems to indicate. Denies fever, chills, chest pain, shortness of breath, LE swelling, abdominal pain, nausea, vomiting, diarrhea, constipation, dysuria, hematuria, urinary frequency, penile discharge, testicular pain or swelling, numbness, tingling, urinary or bowel incontinence, saddle anesthesia/cauda equina symptoms, weakness, or any other associated symptoms. He was previously in chronic pain management, and chart review reveals he has a narcotic contract signed in 2014.     Past Medical History  Diagnosis Date  . Chronic leg pain   . Anxiety   . Depression    Past Surgical History  Procedure Laterality Date  . 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 Bone graft from hip to jaw  2004  . Bone graft right side of head to jaw  2006  . Sphenopalatine ganglionic    . Block procedure at pain center  03/27/06   Family History  Problem Relation Age of Onset  . Hyperlipidemia Mother   . Hypertension Mother   . Depression Mother   . Anxiety disorder Mother   . Depression Brother   . Anxiety disorder Brother   . Bipolar disorder Maternal Grandfather   . Schizophrenia Maternal Grandfather    Social History  Substance Use Topics  . Smoking status: Current Every Day Smoker -- 1.00 packs/day for 10 years    Types: Cigarettes  . Smokeless tobacco: Former Neurosurgeon  .  Alcohol Use: No    Review of Systems  Constitutional: Negative for fever and chills.  Respiratory: Negative for shortness of breath.   Cardiovascular: Negative for chest pain and leg swelling.  Gastrointestinal: Negative for nausea, vomiting, abdominal pain, diarrhea, constipation and bowel incontinence.       Negative for bowel incontinence  Genitourinary: Positive for flank pain (right). Negative for bladder incontinence, dysuria,  frequency, hematuria, discharge, scrotal swelling, difficulty urinating and testicular pain.       Negative for urinary incontinence  Musculoskeletal: Positive for back pain.  Skin: Negative for color change.  Allergic/Immunologic: Negative for immunocompromised state.  Neurological: Negative for weakness, numbness and paresthesias.       Negative for saddle anesthesia  Psychiatric/Behavioral: Negative for confusion.  10 Systems reviewed and all are negative for acute change except as noted in the HPI.   Allergies  Review of patient's allergies indicates no known allergies.  Home Medications   Prior to Admission medications   Medication Sig Start Date End Date Taking? Authorizing Provider  ALPRAZolam (XANAX XR) 2 MG 24 hr tablet Take 2 mg by mouth every morning. 09/09/14   Historical Provider, MD  ARIPiprazole (ABILIFY) 30 MG tablet Take 1 tablet (30 mg total) by mouth daily. 02/28/15 02/28/16  Benjaman Pott, MD  atorvastatin (LIPITOR) 40 MG tablet Take 1 tablet (40 mg total) by mouth daily. 02/14/15   Amy Michelle Nasuti, MD  clonazePAM (KLONOPIN) 1 MG tablet Take 1 mg by mouth 2 (two) times daily.  07/07/14   Historical Provider, MD  cyclobenzaprine (FLEXERIL) 5 MG tablet TAKE 2 TABLETS (10 MG TOTAL) BY MOUTH 3 (THREE) TIMES DAILY AS NEEDED FOR MUSCLE SPASMS. 02/16/15   Amy Michelle Nasuti, MD  diclofenac (VOLTAREN) 75 MG EC tablet TAKE 1 TABLET (75 MG TOTAL) BY MOUTH 2 (TWO) TIMES DAILY. With food. 11/09/14   Hannah Beat, MD  gabapentin (NEURONTIN) 600 MG tablet Take 1,200-1,800 mg by mouth See admin instructions. Take either two tablets ( ) three times a day or three tablets ( ) two times a day.    Historical Provider, MD  PRISTIQ 100 MG 24 hr tablet  02/03/15   Historical Provider, MD  Vitamin D, Ergocalciferol, (DRISDOL) 50000 units CAPS capsule  02/13/15   Historical Provider, MD  Vitamin D, Ergocalciferol, (DRISDOL) 50000 units CAPS capsule Take 1 capsule (50,000 Units total) by mouth  every 7 (seven) days. 02/14/15   Amy E Bedsole, MD   BP 158/108 mmHg  Pulse 93  Temp(Src) 97.5 F (36.4 C) (Oral)  Resp 20  SpO2 99%   Physical Exam  Constitutional: He is oriented to person, place, and time. Vital signs are normal. He appears well-developed and well-nourished.  Non-toxic appearance. No distress.  Afebrile, nontoxic, NAD  HENT:  Head: Normocephalic and atraumatic.  Mouth/Throat: Oropharynx is clear and moist and mucous membranes are normal.  Eyes: Conjunctivae and EOM are normal. Right eye exhibits no discharge. Left eye exhibits no discharge.  Neck: Normal range of motion. Neck supple.  Cardiovascular: Normal rate, regular rhythm, normal heart sounds and intact distal pulses.  Exam reveals no gallop and no friction rub.   No murmur heard. Pulmonary/Chest: Effort normal and breath sounds normal. No respiratory distress. He has no decreased breath sounds. He has no wheezes. He has no rhonchi. He has no rales.  Abdominal: Soft. Normal appearance and bowel sounds are normal. He exhibits no distension. There is no tenderness. There is CVA tenderness (right sided). There is no rigidity,  no rebound, no guarding, no tenderness at McBurney's point and negative Murphy's sign.  Soft, NTND, +BS throughout, no r/g/r, neg murphy's, neg mcburney's, with mild right sided CVA TTP although difficulty to determine whether this is para spinous muscle tenderness or true flank tenderness  Musculoskeletal: Normal range of motion.       Thoracic back: He exhibits tenderness and spasm. He exhibits normal range of motion, no bony tenderness and no deformity.       Back:  Thoracic and lumbar spine with FROM intact without spinous process TTP, no bony stepoffs or deformities, with mild right sided paraspinous muscle TTP and muscle spasms near the flank region but extending down towards the lumbar area. Strength 5/5 in all extremities, sensation grossly intact in all extremities, gait steady and  nonantalgic. No overlying skin changes. Distal pulses intact.  Neurological: He is alert and oriented to person, place, and time. He has normal strength. No sensory deficit.  Skin: Skin is warm, dry and intact. No rash noted.  Psychiatric: He has a normal mood and affect.  Nursing note and vitals reviewed.   ED Course  Procedures (including critical care time)  DIAGNOSTIC STUDIES: Oxygen Saturation is 99% on RA, normal by my interpretation.    COORDINATION OF CARE: 9:03 PM-Discussed treatment plan which includes UA with pt at bedside and pt agreed to plan.   Labs Review Labs Reviewed  URINALYSIS, ROUTINE W REFLEX MICROSCOPIC (NOT AT [facility])    Imaging Review No results found.   Study Result Lumbar Xray 10/26/14    CLINICAL DATA: 40 year old male with a history of lumbar back pain  EXAM: LUMBAR SPINE - COMPLETE 4+ VIEW  COMPARISON: None.  FINDINGS: Lumbar Spine:  Lumbar vertebral elements maintain normal alignment without evidence of anterolisthesis, retrolisthesis, subluxation.  No fracture line identified. Vertebral body heights maintained as well as disc space heights.  No significant degenerative disc disease or endplate changes. Mild facet changes of the L5-S1 level.  Unremarkable appearance of the visualized abdomen.  Oblique images demonstrate no evidence of displaced pars defect  IMPRESSION: No radiographic evidence of acute fracture or malalignment of the lumbar spine.  Mild facet changes of L5-S1.  Signed,  Yvone Neu. Loreta Ave, DO  [facility]  [facility]   Electronically Signed  By: Gilmer Mor D.O.  On: 10/27/2014 08:38     I have personally reviewed and evaluated these lab results as part of my medical decision-making.   EKG Interpretation None      MDM   Final diagnoses:  Chronic back pain  Right-sided thoracic back pain  Back muscle spasm    40 y.o. male  here with R flank/thoracic back pain x4 days, somewhat chronic given that chart review reveals very similar complaints in the past, as recent as Aug and Oct 2016. No other complaints, no urinary complaints, no abdominal tenderness. Doesn't seem to be as much flank as it is thoracic/lumbar paraspinous muscle. +Spasms. Pt has narcotic contract on file in system, want to avoid this. Has already taken klonopin and flexeril today, don't want to add any further muscle relaxants or sedating medications. Will give Toradol IM now. Has had labs in the last year which showed Cr/BUN WNL, doubt need for repeat labs today. Will get U/A to ensure no urinary etiology, but doubt need for further labs/imaging. Has had xray of low back in October which showed mild facet changes of L5/S1. No red flag s/s of low back pain. No s/s of  central cord compression or cauda equina. Lower extremities are neurovascularly intact and patient is ambulating without difficulty. Will reassess shortly.   9:42 PM U/A completely normal, no hematuria or evidence of infection. I feel this is likely just acute on chronic back pain, possible strain in muscle since he has spasms in the thoracic/lumbar paraspinous region. States the toradol shot is helping tremendously.   Patient was counseled on back pain precautions and told to do activity as tolerated but do not lift, push, or pull heavy objects more than 10 pounds for the next week. Patient counseled to use ice or heat on back for no longer than 15 minutes every hour.   Will switch to robaxin from flexeril. Rx given for robaxin and counseled on proper use of muscle relaxant medication. Use home diclofenac and tylenol as needed for pain. Urged patient not to drink alcohol, drive, or perform any other activities that requires focus while taking robaxin.   Patient urged to follow-up with PCP if pain does not improve with treatment and rest or if pain becomes recurrent. Urged to return with worsening  severe pain, loss of bowel or bladder control, trouble walking. The patient verbalizes understanding and agrees with the plan.    I personally performed the services described in this documentation, which was scribed in my presence. The recorded information has been reviewed and is accurate.   BP 158/108 mmHg  Pulse 93  Temp(Src) 97.5 F (36.4 C) (Oral)  Resp 20  SpO2 99%  Meds ordered this encounter  Medications  . ketorolac (TORADOL) 30 MG/ML injection 30 mg    Sig:   . methocarbamol (ROBAXIN) 500 MG tablet    Sig: Take 1 tablet (500 mg total) by mouth every 8 (eight) hours as needed for muscle spasms.    Dispense:  15 tablet    Refill:  0    Order Specific Question:  Supervising Provider    Answer:  Eber Hong [[id number]]       Lailana Shira Camprubi-Soms, PA-C 03/09/15 2144  Glynn Octave, MD 03/09/15 2232

## 2015-03-09 NOTE — Discharge Instructions (Signed)
Back Pain: Your back pain should be treated with medicines such as ibuprofen or aleve and this back pain should get better over the next 2 weeks.  However if you develop severe or worsening pain, back pain with fever, numbness, weakness or inability to walk or urinate, you should return to the ER immediately.  Please follow up with your doctor this week for a recheck if still having symptoms.  Avoid heavy lifting over 10 pounds over the next two weeks.  Back pain is discomfort in the lower back that may be due to injuries to muscles and ligaments around the spine.  Occasionally, it may be caused by a a problem to a part of the spine called a disc.  The pain may last several days or a week;  However, most patients get completely well in 4 weeks.  Self - care:  The application of heat can help soothe the pain.  Maintaining your daily activities, including walking, is encourged, as it will help you get better faster than just staying in bed. Perform gentle stretching as discussed. Drink plenty of fluids.  Medications are also useful to help with pain control.  A commonly prescribed medication includes tylenol.  Non steroidal anti inflammatory medications including Ibuprofen and naproxen or your home diclofenac (voltaren);  These medications help both pain and swelling and are very useful in treating back pain.  They should be taken with food, as they can cause stomach upset, and more seriously, stomach bleeding.    Muscle relaxants:  These medications can help with muscle tightness that is a cause of lower back pain.  Most of these medications can cause drowsiness, and it is not safe to drive or use dangerous machinery while taking them. SWITCH YOUR HOME FLEXERIL FOR THE ROBAXIN, DO NOT TAKE THESE TOGETHER, START USING ROBAXIN INSTEAD TO SEE IF IT HELPS MORE.  SEEK IMMEDIATE MEDICAL ATTENTION IF: New numbness, tingling, weakness, or problem with the use of your arms or legs.  Severe back pain not  relieved with medications.  Difficulty with or loss of control of your bowel or bladder control.  Increasing pain in any areas of the body (such as chest or abdominal pain).  Shortness of breath, dizziness or fainting.  Nausea (feeling sick to your stomach), vomiting, fever, or sweats.  You will need to follow up with  Your primary healthcare provider in 1-2 weeks for reassessment.   Back Pain, Adult Back pain is very common. The pain often gets better over time. The cause of back pain is usually not dangerous. Most people can learn to manage their back pain on their own.  HOME CARE  Watch your back pain for any changes. The following actions may help to lessen any pain you are feeling:  Stay active. Start with short walks on flat ground if you can. Try to walk farther each day.  Exercise regularly as told by your doctor. Exercise helps your back heal faster. It also helps avoid future injury by keeping your muscles strong and flexible.  Do not sit, drive, or stand in one place for more than 30 minutes.  Do not stay in bed. Resting more than 1-2 days can slow down your recovery.  Be careful when you bend or lift an object. Use good form when lifting:  Bend at your knees.  Keep the object close to your body.  Do not twist.  Sleep on a firm mattress. Lie on your side, and bend your knees. If you lie  on your back, put a pillow under your knees.  Take medicines only as told by your doctor.  Put ice on the injured area.  Put ice in a plastic bag.  Place a towel between your skin and the bag.  Leave the ice on for 20 minutes, 2-3 times a day for the first 2-3 days. After that, you can switch between ice and heat packs.  Avoid feeling anxious or stressed. Find good ways to deal with stress, such as exercise.  Maintain a healthy weight. Extra weight puts stress on your back. GET HELP IF:   You have pain that does not go away with rest or medicine.  You have worsening pain  that goes down into your legs or buttocks.  You have pain that does not get better in one week.  You have pain at night.  You lose weight.  You have a fever or chills. GET HELP RIGHT AWAY IF:   You cannot control when you poop (bowel movement) or pee (urinate).  Your arms or legs feel weak.  Your arms or legs lose feeling (numbness).  You feel sick to your stomach (nauseous) or throw up (vomit).  You have belly (abdominal) pain.  You feel like you may pass out (faint).   This information is not intended to replace advice given to you by your health care provider. Make sure you discuss any questions you have with your health care provider.   Document Released: 06/26/2007 Document Revised: 01/28/2014 Document Reviewed: 05/11/2013 Elsevier Interactive Patient Education 2016 [place].  Back Exercises If you have pain in your back, do these exercises 2-3 times each day or as told by your doctor. When the pain goes away, do the exercises once each day, but repeat the steps more times for each exercise (do more repetitions). If you do not have pain in your back, do these exercises once each day or as told by your doctor. EXERCISES Single Knee to Chest Do these steps 3-5 times in a row for each leg:  Lie on your back on a firm bed or the floor with your legs stretched out.  Bring one knee to your chest.  Hold your knee to your chest by grabbing your knee or thigh.  Pull on your knee until you feel a gentle stretch in your lower back.  Keep doing the stretch for 10-30 seconds.  Slowly let go of your leg and straighten it. Pelvic Tilt Do these steps 5-10 times in a row:  Lie on your back on a firm bed or the floor with your legs stretched out.  Bend your knees so they point up to the ceiling. Your feet should be flat on the floor.  Tighten your lower belly (abdomen) muscles to press your lower back against the floor. This will make your tailbone point up to the ceiling  instead of pointing down to your feet or the floor.  Stay in this position for 5-10 seconds while you gently tighten your muscles and breathe evenly. Cat-Cow Do these steps until your lower back bends more easily:  Get on your hands and knees on a firm surface. Keep your hands under your shoulders, and keep your knees under your hips. You may put padding under your knees.  Let your head hang down, and make your tailbone point down to the floor so your lower back is round like the back of a cat.  Stay in this position for 5 seconds.  Slowly lift your head  and make your tailbone point up to the ceiling so your back hangs low (sags) like the back of a cow.  Stay in this position for 5 seconds. Press-Ups Do these steps 5-10 times in a row:  Lie on your belly (face-down) on the floor.  Place your hands near your head, about shoulder-width apart.  While you keep your back relaxed and keep your hips on the floor, slowly straighten your arms to raise the top half of your body and lift your shoulders. Do not use your back muscles. To make yourself more comfortable, you may change where you place your hands.  Stay in this position for 5 seconds.  Slowly return to lying flat on the floor. Bridges Do these steps 10 times in a row:  Lie on your back on a firm surface.  Bend your knees so they point up to the ceiling. Your feet should be flat on the floor.  Tighten your butt muscles and lift your butt off of the floor until your waist is almost as high as your knees. If you do not feel the muscles working in your butt and the back of your thighs, slide your feet 1-2 inches farther away from your butt.  Stay in this position for 3-5 seconds.  Slowly lower your butt to the floor, and let your butt muscles relax. If this exercise is too easy, try doing it with your arms crossed over your chest. Belly Crunches Do these steps 5-10 times in a row:  Lie on your back on a firm bed or the floor  with your legs stretched out.  Bend your knees so they point up to the ceiling. Your feet should be flat on the floor.  Cross your arms over your chest.  Tip your chin a little bit toward your chest but do not bend your neck.  Tighten your belly muscles and slowly raise your chest just enough to lift your shoulder blades a tiny bit off of the floor.  Slowly lower your chest and your head to the floor. Back Lifts Do these steps 5-10 times in a row: 1. Lie on your belly (face-down) with your arms at your sides, and rest your forehead on the floor. 2. Tighten the muscles in your legs and your butt. 3. Slowly lift your chest off of the floor while you keep your hips on the floor. Keep the back of your head in line with the curve in your back. Look at the floor while you do this. 4. Stay in this position for 3-5 seconds. 5. Slowly lower your chest and your face to the floor. GET HELP IF:  Your back pain gets a lot worse when you do an exercise.  Your back pain does not lessen 2 hours after you exercise. If you have any of these problems, stop doing the exercises. Do not do them again unless your doctor says it is okay. GET HELP RIGHT AWAY IF:  You have sudden, very bad back pain. If this happens, stop doing the exercises. Do not do them again unless your doctor says it is okay.   This information is not intended to replace advice given to you by your health care provider. Make sure you discuss any questions you have with your health care provider.   Document Released: 02/09/2010 Document Revised: 09/28/2014 Document Reviewed: 03/03/2014 Elsevier Interactive Patient Education 2016 [place] Injury Prevention Back injuries can be very painful. They can also be difficult to heal. After  having one back injury, you are more likely to injure your back again. It is important to learn how to avoid injuring or re-injuring your back. The following tips can help you to prevent a back  injury. WHAT SHOULD I KNOW ABOUT PHYSICAL FITNESS?  Exercise for 30 minutes per day on most days of the week or as told by your doctor. Make sure to:  Do aerobic exercises, such as walking, jogging, biking, or swimming.  Do exercises that increase balance and strength, such as tai chi and yoga.  Do stretching exercises. This helps with flexibility.  Try to develop strong belly (abdominal) muscles. Your belly muscles help to support your back.  Stay at a healthy weight. This helps to decrease your risk of a back injury. WHAT SHOULD I KNOW ABOUT MY DIET?  Talk with your doctor about your overall diet. Take supplements and vitamins only as told by your doctor.  Talk with your doctor about how much calcium and vitamin D you need each day. These nutrients help to prevent weakening of the bones (osteoporosis).  Include good sources of calcium in your diet, such as:  Dairy products.  Green leafy vegetables.  Products that have had calcium added to them (fortified).  Include good sources of vitamin D in your diet, such as:  Milk.  Foods that have had vitamin D added to them. WHAT SHOULD I KNOW ABOUT MY POSTURE?  Sit up straight and stand up straight. Avoid leaning forward when you sit or hunching over when you stand.  Choose chairs that have good low-back (lumbar) support.  If you work at a desk, sit close to it so you do not need to lean over. Keep your chin tucked in. Keep your neck drawn back. Keep your elbows bent so your arms look like the letter "L" (right angle).  Sit high and close to the steering wheel when you drive. Add a low-back support to your car seat, if needed.  Avoid sitting or standing in one position for very long. Take breaks to get up, stretch, and walk around at least one time every hour. Take breaks every hour if you are driving for long periods of time.  Sleep on your side with your knees slightly bent, or sleep on your back with a pillow under your  knees. Do not lie on the front of your body to sleep. WHAT SHOULD I KNOW ABOUT LIFTING, TWISTING, AND REACHING Lifting and Heavy Lifting  Avoid heavy lifting, especially lifting over and over again. If you must do heavy lifting:  Stretch before lifting.  Work slowly.  Rest between lifts.  Use a tool such as a cart or a dolly to move objects if one is available.  Make several small trips instead of carrying one heavy load.  Ask for help when you need it, especially when moving big objects.  Follow these steps when lifting:  Stand with your feet shoulder-width apart.  Get as close to the object as you can. Do not pick up a heavy object that is far from your body.  Use handles or lifting straps if they are available.  Bend at your knees. Squat down, but keep your heels off the floor.  Keep your shoulders back. Keep your chin tucked in. Keep your back straight.  Lift the object slowly while you tighten the muscles in your legs, belly, and butt. Keep the object as close to the center of your body as possible.  Follow these steps when putting  down a heavy load:  Stand with your feet shoulder-width apart.  Lower the object slowly while you tighten the muscles in your legs, belly, and butt. Keep the object as close to the center of your body as possible.  Keep your shoulders back. Keep your chin tucked in. Keep your back straight.  Bend at your knees. Squat down, but keep your heels off the floor.  Use handles or lifting straps if they are available. Twisting and Reaching  Avoid lifting heavy objects above your waist.  Do not twist at your waist while you are lifting or carrying a load. If you need to turn, move your feet.  Do not bend over without bending at your knees.  Avoid reaching over your head, across a table, or for an object on a high surface.  WHAT ARE SOME OTHER TIPS?  Avoid wet floors and icy ground. Keep sidewalks clear of ice to prevent falls.   Do  not sleep on a mattress that is too soft or too hard.   Keep items that you use often within easy reach.   Put heavier objects on shelves at waist level, and put lighter objects on lower or higher shelves.  Find ways to lower your stress, such as:  Exercise.  Massage.  Relaxation techniques.  Talk with your doctor if you feel anxious or depressed. These conditions can make back pain worse.  Wear flat heel shoes with cushioned soles.  Avoid making quick (sudden) movements.  Use both shoulder straps when carrying a backpack.  Do not use any tobacco products, including cigarettes, chewing tobacco, or electronic cigarettes. If you need help quitting, ask your doctor.   This information is not intended to replace advice given to you by your health care provider. Make sure you discuss any questions you have with your health care provider.   Document Released: 06/26/2007 Document Revised: 05/24/2014 Document Reviewed: 01/11/2014 Elsevier Interactive Patient Education 2016 [place] therapy can help ease sore, stiff, injured, and tight muscles and joints. Heat relaxes your muscles, which may help ease your pain. Heat therapy should only be used on old, pre-existing, or long-lasting (chronic) injuries. Do not use heat therapy unless told by your doctor. HOW TO USE HEAT THERAPY There are several different kinds of heat therapy, including:  Moist heat pack.  Warm water bath.  Hot water bottle.  Electric heating pad.  Heated gel pack.  Heated wrap.  Electric heating pad. GENERAL HEAT THERAPY RECOMMENDATIONS   Do not sleep while using heat therapy. Only use heat therapy while you are awake.  Your skin may turn pink while using heat therapy. Do not use heat therapy if your skin turns red.  Do not use heat therapy if you have new pain.  High heat or long exposure to heat can cause burns. Be careful when using heat therapy to avoid burning your  skin.  Do not use heat therapy on areas of your skin that are already irritated, such as with a rash or sunburn. GET HELP IF:   You have blisters, redness, swelling (puffiness), or numbness.  You have new pain.  Your pain is worse. MAKE SURE YOU:  Understand these instructions.  Will watch your condition.  Will get help right away if you are not doing well or get worse.   This information is not intended to replace advice given to you by your health care provider. Make sure you discuss any questions you have with your health  care provider.   Document Released: 04/01/2011 Document Revised: 01/28/2014 Document Reviewed: 03/02/2013 Elsevier Interactive Patient Education 2016 [employer]  Muscle Cramps and Spasms Muscle cramps and spasms are when muscles tighten by themselves. They usually get better within minutes. Muscle cramps are painful. They are usually stronger and last longer than muscle spasms. Muscle spasms may or may not be painful. They can last a few seconds or much longer. HOME CARE  Drink enough fluid to keep your pee (urine) clear or pale yellow.  Massage, stretch, and relax the muscle.  Use a warm towel, heating pad, or warm shower water on tight muscles.  Place ice on the muscle if it is tender or in pain.  Put ice in a plastic bag.  Place a towel between your skin and the bag.  Leave the ice on for 15-20 minutes, 03-04 times a day.  Only take medicine as told by your doctor. GET HELP RIGHT AWAY IF:  Your cramps or spasms get worse, happen more often, or do not get better with time. MAKE SURE YOU:  Understand these instructions.  Will watch your condition.  Will get help right away if you are not doing well or get worse.   This information is not intended to replace advice given to you by your health care provider. Make sure you discuss any questions you have with your health care provider.   Document Released: 12/21/2007 Document Revised:  05/04/2012 Document Reviewed: 12/25/2011 Elsevier Interactive Patient Education [employer].

## 2015-03-09 NOTE — Progress Notes (Signed)
    Daily Group Progress Note  Program: IOP  Group Time: 9:00-10:30  Participation Level: Active  Behavioral Response: Appropriate  Type of Therapy:  Group Therapy  Summary of Progress: Pt. Presented as restless, anxious, agitated, challenged to focus on the group process. Pt. Was encouraged to share his symptoms with Dr. Ladona Ridgel so that they could be addressed prior to his discharge.     Group Time: 10:30-12:00  Participation Level:  Active  Behavioral Response: Appropriate  Type of Therapy: Psycho-education Group  Summary of Progress: Pt. Participated in yoga therapy group facilitated by Forde Radon, LPC.  Shaune Pollack, LPC

## 2015-03-10 ENCOUNTER — Other Ambulatory Visit (HOSPITAL_COMMUNITY): Payer: Managed Care, Other (non HMO)

## 2015-03-10 NOTE — Progress Notes (Signed)
    Daily Group Progress Note  Program: IOP  Group Time: 9:00-10:00  Participation Level: Active  Behavioral Response: Appropriate  Type of Therapy:  Group Therapy  Summary of Progress: Pt. Presented as quiet, lethargic, appeared sleepy and distracted during most of group.     Group Time: 10:30-12:00  Participation Level:  Active  Behavioral Response: Appropriate  Type of Therapy: Psycho-education Group  Summary of Progress: Pt. Participated in discussion about Dewain Penning developing vulnerability to manage shame and avoidance.   Shaune Pollack, LPC

## 2015-03-13 ENCOUNTER — Encounter: Payer: Self-pay | Admitting: Internal Medicine

## 2015-03-13 ENCOUNTER — Ambulatory Visit (INDEPENDENT_AMBULATORY_CARE_PROVIDER_SITE_OTHER): Payer: Managed Care, Other (non HMO) | Admitting: Internal Medicine

## 2015-03-13 ENCOUNTER — Other Ambulatory Visit (HOSPITAL_COMMUNITY): Payer: Managed Care, Other (non HMO) | Admitting: Psychiatry

## 2015-03-13 ENCOUNTER — Ambulatory Visit (INDEPENDENT_AMBULATORY_CARE_PROVIDER_SITE_OTHER)
Admission: RE | Admit: 2015-03-13 | Discharge: 2015-03-13 | Disposition: A | Discharge status: Home / Self Care | Payer: Managed Care, Other (non HMO) | Source: Ambulatory Visit | Attending: Internal Medicine | Admitting: Internal Medicine

## 2015-03-13 VITALS — BP 144/98 | HR 104 | Temp 98.4°F | Wt 229.0 lb

## 2015-03-13 DIAGNOSIS — M546 Pain in thoracic spine: Secondary | ICD-10-CM

## 2015-03-13 DIAGNOSIS — S29012D Strain of muscle and tendon of back wall of thorax, subsequent encounter: Secondary | ICD-10-CM

## 2015-03-13 DIAGNOSIS — R220 Localized swelling, mass and lump, head: Secondary | ICD-10-CM

## 2015-03-13 DIAGNOSIS — F251 Schizoaffective disorder, depressive type: Secondary | ICD-10-CM

## 2015-03-13 DIAGNOSIS — F259 Schizoaffective disorder, unspecified: Secondary | ICD-10-CM | POA: Diagnosis not present

## 2015-03-13 MED ORDER — METHOCARBAMOL 500 MG PO TABS: 500.0000 mg | ORAL_TABLET | Freq: Three times a day (TID) | ORAL | Status: DC | PRN

## 2015-03-13 MED ORDER — METHYLPREDNISOLONE ACETATE 80 MG/ML IJ SUSP
80.0000 mg | Freq: Once | INTRAMUSCULAR | Status: AC
  Administered 2015-03-13: 80 mg via INTRAMUSCULAR

## 2015-03-13 NOTE — Addendum Note (Signed)
Addended by: Roena Malady on: 03/13/2015 02:30 PM   Modules accepted: Orders

## 2015-03-13 NOTE — Patient Instructions (Signed)
Angioedema  Angioedema is a sudden swelling of tissues, often of the skin. It can occur on the face or genitals or in the abdomen or other body parts. The swelling usually develops over a short period and gets better in 24 to 48 hours. It often begins during the night and is found when the person wakes up. The person may also get red, itchy patches of skin (hives). Angioedema can be dangerous if it involves swelling of the air passages.   Depending on the cause, episodes of angioedema may only happen once, come back in unpredictable patterns, or repeat for several years and then gradually fade away.   CAUSES   Angioedema can be caused by an allergic reaction to various triggers. It can also result from nonallergic causes, including reactions to drugs, immune system disorders, viral infections, or an abnormal gene that is passed to you from your parents (hereditary). For some people with angioedema, the cause is unknown.   Some things that can trigger angioedema include:    Foods.    Medicines, such as ACE inhibitors, ARBs, nonsteroidal anti-inflammatory agents, or estrogen.    Latex.    Animal saliva.    Insect stings.    Dyes used in X-rays.    Mild injury.    Dental work.   Surgery.   Stress.    Sudden changes in temperature.    Exercise.  SIGNS AND SYMPTOMS    Swelling of the skin.   Hives. If these are present, there is also intense itching.   Redness in the affected area.    Pain in the affected area.   Swollen lips or tongue.   Breathing problems. This may happen if the air passages swell.   Wheezing.  If internal organs are involved, there may be:    Nausea.    Abdominal pain.    Vomiting.    Difficulty swallowing.    Difficulty passing urine.  DIAGNOSIS    Your health care provider will examine the affected area and take a medical and family history.   Various tests may be done to help determine the cause. Tests may include:   Allergy skin tests to see if the problem  is an allergic reaction.    Blood tests to check for hereditary angioedema.    Tests to check for underlying diseases that could cause the condition.    A review of your medicines, including over-the-counter medicines, may be done.  TREATMENT   Treatment will depend on the cause of the angioedema. Possible treatments include:    Removal of anything that triggered the condition (such as stopping certain medicines).    Medicines to treat symptoms or prevent attacks. Medicines given may include:     Antihistamines.     Epinephrine injection.     Steroids.    Hospitalization may be required for severe attacks. If the air passages are affected, it can be an emergency. Tubes may need to be placed to keep the airway open.  HOME CARE INSTRUCTIONS    Take all medicines as directed by your health care provider.   If you were given medicines for emergency allergy treatment, always carry them with you.   Wear a medical bracelet as directed by your health care provider.    Avoid known triggers.  SEEK MEDICAL CARE IF:    You have repeat attacks of angioedema.    Your attacks are more frequent or more severe despite preventive measures.    You have hereditary angioedema   and are considering having children. It is important to discuss with your health care provider the risks of passing the condition on to your children.  SEEK IMMEDIATE MEDICAL CARE IF:    You have severe swelling of the mouth, tongue, or lips.   You have difficulty breathing.    You have difficulty swallowing.    You faint.  MAKE SURE YOU:   Understand these instructions.   Will watch your condition.   Will get help right away if you are not doing well or get worse.     This information is not intended to replace advice given to you by your health care provider. Make sure you discuss any questions you have with your health care provider.     Document Released: 03/18/2001 Document Revised: 01/28/2014 Document Reviewed:  08/31/2012  Elsevier Interactive Patient Education 2016 [employer]

## 2015-03-13 NOTE — Progress Notes (Signed)
Subjective:    Patient ID: Jeffrey Lin, male    DOB: 08-23-1975, 40 y.o.   MRN: [id number]  HPI  Pt presents to the clinic today with c/o facial swelling. He reports this started 2 days ago. He woke up and the right side of his face was swollen. He denies headache, facial pressure, nasal congestion, runny nose, ear pain or sore throat. He has had a lot of dental extractions on his upper and lower mouth, but denies pain in his mouth. He denies difficulty swallowing or breathing. The only new medications are the Toradol and Robaxin, given to him at the ER. He denies any changes in his diet.  He is also here for ER follow up for right sided back pain. This has been intermittent for months, but reports it seemed to be getting worse. Xray of the lumbar spine was normal. His Flexeril was changed to Robaxin which he reports works much better. He also takes Diclofenac daily. He denies use of any controlled substances. He reports his right side back pain is much better. Today, he is having pain in the middle of his back, between his shoulder blades. He describes the pain as sharp and stabbing. He denies chest pain or shornness of breath. He denies injury to the area. He has not taken anything other than the Robaxin and Diclofenac.  Review of Systems      Past Medical History  Diagnosis Date  . Chronic leg pain   . Anxiety   . Depression     Current Outpatient Prescriptions  Medication Sig Dispense Refill  . ALPRAZolam (XANAX XR) 2 MG 24 hr tablet Take 2 mg by mouth every morning.  1  . ARIPiprazole (ABILIFY) 30 MG tablet Take 1 tablet (30 mg total) by mouth daily. 30 tablet 2  . atorvastatin (LIPITOR) 40 MG tablet Take 1 tablet (40 mg total) by mouth daily. 90 tablet 3  . clonazePAM (KLONOPIN) 1 MG tablet Take 1 mg by mouth 2 (two) times daily.   3  . diclofenac (VOLTAREN) 75 MG EC tablet TAKE 1 TABLET (75 MG TOTAL) BY MOUTH 2 (TWO) TIMES DAILY. With food. 60 tablet 2  . gabapentin  (NEURONTIN) 600 MG tablet Take 1,200-1,800 mg by mouth See admin instructions. Take either two tablets (1200mg ) three times a day or three tablets (1800mg ) two times a day.    . methocarbamol (ROBAXIN) 500 MG tablet Take 1 tablet (500 mg total) by mouth every 8 (eight) hours as needed for muscle spasms. 30 tablet 0  . PRISTIQ 100 MG 24 hr tablet     . Vitamin D, Ergocalciferol, (DRISDOL) 50000 units CAPS capsule     . Vitamin D, Ergocalciferol, (DRISDOL) 50000 units CAPS capsule Take 1 capsule (50,000 Units total) by mouth every 7 (seven) days. 12 capsule 0  . benztropine (COGENTIN) 1 MG tablet Take 1 tablet by mouth 2 (two) times daily.     No current facility-administered medications for this visit.    No Known Allergies  Family History  Problem Relation Age of Onset  . Hyperlipidemia Mother   . Hypertension Mother   . Depression Mother   . Anxiety disorder Mother   . Depression Brother   . Anxiety disorder Brother   . Bipolar disorder Maternal Grandfather   . Schizophrenia Maternal Grandfather     Social History   Social History  . Marital Status: Single    Spouse Name: N/A  . Number of Children: 0  .  Years of Education: N/A   Occupational History  . Machine Shop Tool and Scientist, forensic    Social History Main Topics  . Smoking status: Current Every Day Smoker -- 1.00 packs/day for 10 years    Types: Cigarettes  . Smokeless tobacco: Former Neurosurgeon  . Alcohol Use: No  . Drug Use: No  . Sexual Activity: Not Currently   Other Topics Concern  . Not on file   Social History Narrative   In relationship, + sex, + condom   Exercise:  None, running causes head pain   Diet:  FF once daily, + fruits and veggies, + H2O, + Soda     Constitutional: Denies fever, malaise, fatigue, headache or abrupt weight changes.  HEENT: Denies eye pain, eye redness, ear pain, ringing in the ears, wax buildup, runny nose, nasal congestion, bloody nose, or sore throat. Respiratory: Denies  difficulty breathing, shortness of breath, cough or sputum production.   Cardiovascular: Denies chest pain, chest tightness, palpitations or swelling in the hands or feet.  Musculoskeletal: Pt reports back pain. Denies decrease in range of motion, difficulty with gait, muscle pain or joint swelling.  Skin: Pt reports facial swelling. Denies redness, rashes, lesions or ulcercations.  Neurological: Denies dizziness, difficulty with memory, difficulty with speech or problems with balance and coordination.    No other specific complaints in a complete review of systems (except as listed in HPI above).  Objective:   Physical Exam   BP 144/98 mmHg  Pulse 104  Temp(Src) 98.4 F (36.9 C) (Oral)  Wt 229 lb (103.874 kg)  SpO2 97% Wt Readings from Last 3 Encounters:  03/13/15 229 lb (103.874 kg)  02/17/15 229 lb 9.6 oz (104.146 kg)  02/14/15 229 lb 8 oz (104.101 kg)    General: Appears his stated age, well developed, well nourished in NAD. Skin: Warm, dry and intact. Right side facial swelling noted. HEENT: Head: normal shape and size, right side maxillary tenderness noted; Eyes: sclera white, no icterus, conjunctiva pink, PERRLA and EOMs intact;  Right Ears: Tm's gray and intact, normal light reflex; Nose: mucosa pink and moist, septum midline; Throat/Mouth: mucosa pink and moist, no exudate, lesions or ulcerations noted.  Neck:  Neck supple, trachea midline. No masses, lumps or thyromegaly present.  Cardiovascular: Normal rate and rhythm. S1,S2 noted.  No murmur, rubs or gallops noted. Pulmonary/Chest: Normal effort and positive vesicular breath sounds. No respiratory distress. No wheezes, rales or ronchi noted.  Musculoskeletal: Normal flexion, extension and rotation of the spine. Bony tenderness noted over the thoracic spine. No pain with palpation of the paraspinal muscles. Strength 5/5 BLE.    BMET    Component Value Date/Time   NA 140 07/26/2014 0015   NA 145 09/11/2013 0406   K  3.9 07/26/2014 0015   K 3.6 09/11/2013 0406   CL 101 07/26/2014 0015   CL 110* 09/11/2013 0406   CO2 31 07/26/2014 0015   CO2 26 09/11/2013 0406   GLUCOSE 110* 07/26/2014 0015   GLUCOSE 81 09/11/2013 0406   BUN 6 07/26/2014 0015   BUN 19* 09/11/2013 0406   CREATININE 1.11 07/26/2014 0015   CREATININE 1.05 09/11/2013 0406   CALCIUM 9.1 07/26/2014 0015   CALCIUM 8.0* 09/11/2013 0406   GFRNONAA >60 07/26/2014 0015   GFRNONAA >60 09/11/2013 0406   GFRAA >60 07/26/2014 0015   GFRAA >60 09/11/2013 0406    Lipid Panel     Component Value Date/Time   CHOL 156 09/28/2010 1005  TRIG 102.0 09/28/2010 1005   HDL 33.80* 09/28/2010 1005   CHOLHDL 5 09/28/2010 1005   VLDL 20.4 09/28/2010 1005   LDLCALC 102* 09/28/2010 1005    CBC    Component Value Date/Time   WBC 11.3* 07/26/2014 0015   WBC 7.8 09/11/2013 0406   RBC 5.30 07/26/2014 0015   RBC 4.28* 09/11/2013 0406   HGB 16.8 07/26/2014 0015   HGB 13.1 09/11/2013 0406   HCT 49.0 07/26/2014 0015   HCT 39.2* 09/11/2013 0406   PLT 219 07/26/2014 0015   PLT 171 09/11/2013 0406   MCV 92.5 07/26/2014 0015   MCV 92 09/11/2013 0406   MCH 31.7 07/26/2014 0015   MCH 30.5 09/11/2013 0406   MCHC 34.3 07/26/2014 0015   MCHC 33.3 09/11/2013 0406   RDW 13.3 07/26/2014 0015   RDW 12.8 09/11/2013 0406   LYMPHSABS 3.4 07/14/2014 1300   LYMPHSABS 3.3 09/11/2013 0406   MONOABS 0.3 07/14/2014 1300   MONOABS 0.4 09/11/2013 0406   EOSABS 0.2 07/14/2014 1300   EOSABS 0.1 09/11/2013 0406   BASOSABS 0.0 07/14/2014 1300   BASOSABS 0.0 09/11/2013 0406    Hgb A1C No results found for: HGBA1C      Assessment & Plan:   Hospital follow up for right side back pain:  Hospital notes, labs and imaging reviewed Right side back pain improving Stretching exercises given Continue Robaxin and Diclofenac  Thoracic back pain:  Will obtain xray of thoracic spine today Continue Robaxin and Diclofinac  Right side facial swelling:  No s/s of  sinus infection or dental abscess No s/s of ear infection 80 mg Depo IM today Benadryl 23-50 mg Q12 H Return precautions given  RTC as needed or if symptoms persist or worsen

## 2015-03-13 NOTE — Progress Notes (Signed)
Pre visit review using our clinic review tool, if applicable. No additional management support is needed unless otherwise documented below in the visit note. 

## 2015-03-14 ENCOUNTER — Ambulatory Visit: Payer: Self-pay | Admitting: Family Medicine

## 2015-03-14 ENCOUNTER — Other Ambulatory Visit (HOSPITAL_COMMUNITY): Payer: Managed Care, Other (non HMO) | Admitting: Psychiatry

## 2015-03-14 NOTE — Progress Notes (Signed)
Jeffrey Lin is a 40 y.o. , single, employed, Caucasian male, who was referred per Dr. Evelene Croon; treatment for ongoing depressive and anxiety symptoms. Admitted to passive SI; denied a plan or intent. Was able to contract for safety. Admitted to daily panic attacks. According to pt, he had been depressed since age 33. 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 All are African American except one, "but she's married to a black man." Pt states it has nothing to do with their race, but there's a lot of conflict there. "They are always keeping something stirred up." Pt states the company will be closing March 31st and sending all their work to the [place].  Pt reports overall mood improving.  Reports that Dr. Evelene Croon has written him out of work through the end of March 2017.  "This helps with my anxiety."  Pt plans to follow up with groups at [facility].  Denies SI/HI.  A:  D/C today.  F/U with Dr. Evelene Croon and Hurley Cisco, LCSW.  Encouraged support groups.  R:  Pt receptive.      Chestine Spore, RITA, M.Ed, CNA

## 2015-03-14 NOTE — Progress Notes (Signed)
Patient ID: Jeffrey Lin, male   DOB: 1975/04/16, 40 y.o.   MRN: [id number] Jeffrey Lin was discharged on 20 February not 16 February

## 2015-03-14 NOTE — Patient Instructions (Signed)
Patient completed MH-IOP today.  Follow up with Hurley Cisco, LCSW and Dr. Evelene Croon.  Encouraged support groups.

## 2015-03-14 NOTE — Progress Notes (Signed)
Patient ID: RAKESH DUTKO, male   DOB: November 21, 1975, 40 y.o.   MRN: [id number] Discharge Note  Patient:  Jeffrey Lin is an 40 y.o., male DOB:  1975-03-24  Date of Admission:  02/16/2014  Date of Discharge:  03/08/2014  Reason for Admission:depression and hearing voices  IOP Course:  Jeffrey Lin attended and participated in groups.  Said it was beneficial. Less depressed.  Abilify was increased to 30 mg and that helped the hallucinations enough that they are not bothering him he said.  He will not be going back to work and his company is closing in March anyway.  He will find other work he said.  Mental Status at Discharge:no suicidal thoughts.  Hallucinations much decreased and not bothering him he said.  Lab Results: No results found for this or any previous visit (from the past 48 hour(s)).   Current outpatient prescriptions:  .  ALPRAZolam (XANAX XR) 2 MG 24 hr tablet, Take 2 mg by mouth every morning., Disp: , Rfl: 1 .  ARIPiprazole (ABILIFY) 30 MG tablet, Take 1 tablet (30 mg total) by mouth daily., Disp: 30 tablet, Rfl: 2 .  atorvastatin (LIPITOR) 40 MG tablet, Take 1 tablet (40 mg total) by mouth daily., Disp: 90 tablet, Rfl: 3 .  benztropine (COGENTIN) 1 MG tablet, Take 1 tablet by mouth 2 (two) times daily., Disp: , Rfl:  .  clonazePAM (KLONOPIN) 1 MG tablet, Take 1 mg by mouth 2 (two) times daily. , Disp: , Rfl: 3 .  diclofenac (VOLTAREN) 75 MG EC tablet, TAKE 1 TABLET (75 MG TOTAL) BY MOUTH 2 (TWO) TIMES DAILY. With food., Disp: 60 tablet, Rfl: 2 .  gabapentin (NEURONTIN) 600 MG tablet, Take 1,200-1,800 mg by mouth See admin instructions. Take either two tablets ( ) three times a day or three tablets ( ) two times a day., Disp: , Rfl:  .  methocarbamol (ROBAXIN) 500 MG tablet, Take 1 tablet (500 mg total) by mouth every 8 (eight) hours as needed for muscle spasms., Disp: 30 tablet, Rfl: 0 .  PRISTIQ 100 MG 24 hr tablet, , Disp: , Rfl:  .  Vitamin D, Ergocalciferol,  (DRISDOL) 50000 units CAPS capsule, , Disp: , Rfl:  .  Vitamin D, Ergocalciferol, (DRISDOL) 50000 units CAPS capsule, Take 1 capsule (50,000 Units total) by mouth every 7 (seven) days., Disp: 12 capsule, Rfl: 0  Axis Diagnosis:  Schizoaffective disorder, depressed    Level of Care:  IOP  Discharge destination:  Other:  has appointment with a psychiatrist and a therapist  Is patient on multiple antipsychotic therapies at discharge:  No    Has Patient had three or more failed trials of antipsychotic monotherapy by history:  Negative  Patient phone:  [phone] (home)  Patient address:   [address] [place] [address],   Follow-up recommendations:  Activity:  continue current activity Diet:  continue current diet  Comments:  none  The patient received suicide prevention pamphlet:  Yes  Carolanne Grumbling 03/14/2015, 12:19 PM

## 2015-03-15 ENCOUNTER — Other Ambulatory Visit (HOSPITAL_COMMUNITY): Payer: Managed Care, Other (non HMO)

## 2015-03-15 NOTE — Progress Notes (Signed)
    Daily Group Progress Note  Program: IOP  Group Time: 9:00-10:30  Participation Level: Active  Behavioral Response: Appropriate  Type of Therapy:  Psycho-education Group  Summary of Progress: Pt. Participated in medication education group with [place].     Group Time: 10:30-12:00  Participation Level:  Active  Behavioral Response: Appropriate  Type of Therapy: Group Therapy  Summary of Progress: Pt. Participated in discussion about childhood wounds and how the needs to have childhood needs met follows into adult relationships.  Nancie Neas, LPC

## 2015-03-16 ENCOUNTER — Other Ambulatory Visit (HOSPITAL_COMMUNITY): Payer: Managed Care, Other (non HMO)

## 2015-03-17 ENCOUNTER — Other Ambulatory Visit (HOSPITAL_COMMUNITY): Payer: Managed Care, Other (non HMO)

## 2015-03-20 ENCOUNTER — Other Ambulatory Visit (HOSPITAL_COMMUNITY): Payer: Managed Care, Other (non HMO)

## 2015-03-21 ENCOUNTER — Other Ambulatory Visit (HOSPITAL_COMMUNITY): Payer: Managed Care, Other (non HMO)

## 2015-03-22 ENCOUNTER — Other Ambulatory Visit (HOSPITAL_COMMUNITY): Payer: Managed Care, Other (non HMO)

## 2015-03-23 ENCOUNTER — Other Ambulatory Visit (HOSPITAL_COMMUNITY): Payer: Managed Care, Other (non HMO)

## 2015-03-23 ENCOUNTER — Other Ambulatory Visit: Payer: Self-pay | Admitting: Internal Medicine

## 2015-03-23 MED ORDER — METHOCARBAMOL 500 MG PO TABS: 500.0000 mg | ORAL_TABLET | Freq: Three times a day (TID) | ORAL | Status: DC | PRN

## 2015-03-24 ENCOUNTER — Other Ambulatory Visit (HOSPITAL_COMMUNITY): Payer: Managed Care, Other (non HMO)

## 2015-03-24 NOTE — Telephone Encounter (Signed)
Rx faxed to [facility] [place] [phone]. 

## 2015-03-27 ENCOUNTER — Other Ambulatory Visit (HOSPITAL_COMMUNITY): Payer: Managed Care, Other (non HMO)

## 2015-03-28 ENCOUNTER — Other Ambulatory Visit (HOSPITAL_COMMUNITY): Payer: Managed Care, Other (non HMO)

## 2015-03-29 ENCOUNTER — Other Ambulatory Visit (HOSPITAL_COMMUNITY): Payer: Managed Care, Other (non HMO)

## 2015-03-30 ENCOUNTER — Other Ambulatory Visit (HOSPITAL_COMMUNITY): Payer: Managed Care, Other (non HMO)

## 2015-04-14 ENCOUNTER — Other Ambulatory Visit: Payer: Self-pay | Admitting: Family Medicine

## 2015-04-14 NOTE — Telephone Encounter (Signed)
Electronic refill request. Last Filled:    60 tablet 2 11/09/2014  Last office visit:   03/03/15 with Baity.  Please advise.

## 2015-04-20 ENCOUNTER — Other Ambulatory Visit: Payer: Self-pay | Admitting: Family Medicine

## 2015-04-20 NOTE — Telephone Encounter (Signed)
Electronic refill request.  Last Filled:    90 tablet 0 03/23/2015  Appt coming up on 05/16/15 with labs prior.  Please advise.

## 2015-05-09 ENCOUNTER — Telehealth: Payer: Self-pay | Admitting: Family Medicine

## 2015-05-09 DIAGNOSIS — E7800 Pure hypercholesterolemia, unspecified: Secondary | ICD-10-CM

## 2015-05-09 DIAGNOSIS — E559 Vitamin D deficiency, unspecified: Secondary | ICD-10-CM

## 2015-05-09 NOTE — Telephone Encounter (Signed)
-----   Message from Baldomero LamyNatasha C Chavers sent at 05/04/2015 10:20 AM EDT ----- Regarding: 3 mo f/u labs Wed 4/19, need orders. Thanks! :-) Please order future 3 mo f/u labs for pt's upcoming lab appt. Thanks Rodney Boozeasha

## 2015-05-10 ENCOUNTER — Other Ambulatory Visit (INDEPENDENT_AMBULATORY_CARE_PROVIDER_SITE_OTHER): Payer: Managed Care, Other (non HMO)

## 2015-05-10 DIAGNOSIS — E559 Vitamin D deficiency, unspecified: Secondary | ICD-10-CM

## 2015-05-10 DIAGNOSIS — E7800 Pure hypercholesterolemia, unspecified: Secondary | ICD-10-CM

## 2015-05-10 LAB — COMPREHENSIVE METABOLIC PANEL
ALK PHOS: 114 U/L (ref 39–117)
ALT: 19 U/L (ref 0–53)
AST: 15 U/L (ref 0–37)
Albumin: 4.1 g/dL (ref 3.5–5.2)
BILIRUBIN TOTAL: 0.7 mg/dL (ref 0.2–1.2)
BUN: 6 mg/dL (ref 6–23)
CALCIUM: 9.5 mg/dL (ref 8.4–10.5)
CO2: 35 mEq/L — ABNORMAL HIGH (ref 19–32)
Chloride: 102 mEq/L (ref 96–112)
Creatinine, Ser: 1.09 mg/dL (ref 0.40–1.50)
GFR: 80.07 mL/min (ref >60.00)
GLUCOSE: 98 mg/dL (ref 70–99)
POTASSIUM: 4 meq/L (ref 3.5–5.1)
Sodium: 143 mEq/L (ref 135–145)
TOTAL PROTEIN: 7.2 g/dL (ref 6.0–8.3)

## 2015-05-10 LAB — LIPID PANEL
Cholesterol: 142 mg/dL (ref 0–200)
HDL: 25.6 mg/dL — AB (ref >39.00)
NonHDL: 115.98
TRIGLYCERIDES: 247 mg/dL — AB (ref 0.0–149.0)
Total CHOL/HDL Ratio: 6
VLDL: 49.4 mg/dL — AB (ref 0.0–40.0)

## 2015-05-10 LAB — VITAMIN D 25 HYDROXY (VIT D DEFICIENCY, FRACTURES): VITD: 85.43 ng/mL (ref 30.00–100.00)

## 2015-05-10 LAB — LDL CHOLESTEROL, DIRECT: Direct LDL: 84 mg/dL

## 2015-05-14 ENCOUNTER — Other Ambulatory Visit: Payer: Self-pay | Admitting: Family Medicine

## 2015-05-14 NOTE — Telephone Encounter (Signed)
Last office visit 03/13/2015 with [name].  Last refilled 04/20/2015 for #90 with no refills.  Ok to refill?

## 2015-05-16 ENCOUNTER — Ambulatory Visit (INDEPENDENT_AMBULATORY_CARE_PROVIDER_SITE_OTHER): Payer: Managed Care, Other (non HMO) | Admitting: Family Medicine

## 2015-05-16 ENCOUNTER — Encounter: Payer: Self-pay | Admitting: Family Medicine

## 2015-05-16 VITALS — BP 102/78 | HR 81 | Temp 97.7°F | Ht 78.0 in | Wt 228.5 lb

## 2015-05-16 DIAGNOSIS — E7800 Pure hypercholesterolemia, unspecified: Secondary | ICD-10-CM | POA: Diagnosis not present

## 2015-05-16 DIAGNOSIS — R7309 Other abnormal glucose: Secondary | ICD-10-CM | POA: Diagnosis not present

## 2015-05-16 NOTE — Patient Instructions (Signed)
Keep trying to increase exercise and replace any animal fats with veggie fats.

## 2015-05-16 NOTE — Progress Notes (Signed)
Pre visit review using our clinic review tool, if applicable. No additional management support is needed unless otherwise documented below in the visit note. 

## 2015-05-16 NOTE — Assessment & Plan Note (Signed)
Resolved with diet changes. 

## 2015-05-16 NOTE — Progress Notes (Signed)
40 year old male with Hx of hypercholesterolemia presents for 3 month follow up of high cholesterol.   Febuary facial swelling now resolved.  Elevated Cholesterol:  Now on atorvastatin 40 mg daily. 84 down from 259!LDL now at goal at   trigs are still elevated but better, down from 354. Lab Results  Component Value Date   CHOL 142 05/10/2015   HDL 25.60* 05/10/2015   LDLCALC 102* 09/28/2010   LDLDIRECT 84.0 05/10/2015   TRIG 247.0* 05/10/2015   CHOLHDL 6 05/10/2015  Using medications without problems: None Muscle aches:  Diet compliance:  Now eating  Two times a day. Has decreased chol in diet some Exercise: he has been walking his new dogs., plying basketball. Other complaints: strong family history of high cholesterol  Prediabetes: Improved, glucose 98  Review of Systems  Constitutional: Negative for fatigue.  HENT: Negative for ear pain.  Eyes: Negative for pain.  Respiratory: Negative for shortness of breath.       Objective:   Physical Exam  Constitutional: Vital signs are normal. He appears well-developed and well-nourished.  HENT:  Head: Normocephalic.  Right Ear: Hearing normal.  Left Ear: Hearing normal.  Nose: Nose normal.  Mouth/Throat: Oropharynx is clear and moist and mucous membranes are normal.  Neck: Trachea normal. Carotid bruit is not present. No thyroid mass and no thyromegaly present.  Cardiovascular: Normal rate, regular rhythm and normal pulses. Exam reveals no gallop, no distant heart sounds and no friction rub.  No murmur heard. No peripheral edema  Pulmonary/Chest: Effort normal and breath sounds normal. No respiratory distress.  Skin: Skin is warm, dry and intact. No rash noted.  Psychiatric: He has a normal mood and affect. His speech is normal and behavior is normal. Thought content normal.

## 2015-05-16 NOTE — Assessment & Plan Note (Signed)
Improved with atorvastatin. HDL and tri still not at goal. Reviewed ways to obtain these secondary goals.

## 2015-06-08 ENCOUNTER — Other Ambulatory Visit: Payer: Self-pay | Admitting: Family Medicine

## 2015-06-08 NOTE — Telephone Encounter (Signed)
Last office visit 05/16/2015.  Last refilled 05/15/2015 for #90 with no refills. Ok to refill?

## 2015-06-09 MED ORDER — METHOCARBAMOL 500 MG PO TABS: 500.0000 mg | ORAL_TABLET | Freq: Three times a day (TID) | ORAL | Status: DC | PRN

## 2015-07-05 ENCOUNTER — Other Ambulatory Visit: Payer: Self-pay | Admitting: Family Medicine

## 2015-07-05 NOTE — Telephone Encounter (Signed)
Last office visit 05/16/2015.  Last refilled 06/09/2015 for #90 with no refills.  Ok to refill?

## 2015-07-06 MED ORDER — METHOCARBAMOL 500 MG PO TABS: 500.0000 mg | ORAL_TABLET | Freq: Three times a day (TID) | ORAL | Status: DC | PRN

## 2015-07-09 ENCOUNTER — Other Ambulatory Visit: Payer: Self-pay | Admitting: Family Medicine

## 2015-08-01 ENCOUNTER — Other Ambulatory Visit: Payer: Self-pay | Admitting: Family Medicine

## 2015-08-02 MED ORDER — METHOCARBAMOL 500 MG PO TABS: 500.0000 mg | ORAL_TABLET | Freq: Three times a day (TID) | ORAL | Status: DC | PRN

## 2015-08-02 NOTE — Telephone Encounter (Signed)
[name] called to check on status of his refill for Robaxin.  Advised it is waiting for Dr. Daphine DeutscherBedsole's approval and she will be back in the office tomorrow.  Patient states he is leaving to go out of town tomorrow morning and want to see if another doctor can approve his refill.  Will forward to Dr. Patsy Lageropland.

## 2015-08-02 NOTE — Telephone Encounter (Signed)
Last office visit 05/16/2015.  Last refilled 07/06/2015 for #90 with no refills.  Ok to refill?

## 2015-08-11 ENCOUNTER — Encounter: Payer: Self-pay | Admitting: Family Medicine

## 2015-08-11 ENCOUNTER — Ambulatory Visit (INDEPENDENT_AMBULATORY_CARE_PROVIDER_SITE_OTHER): Payer: Managed Care, Other (non HMO) | Admitting: Family Medicine

## 2015-08-11 VITALS — BP 104/80 | HR 83 | Temp 98.0°F | Ht 78.0 in | Wt 238.2 lb

## 2015-08-11 DIAGNOSIS — W57XXXA Bitten or stung by nonvenomous insect and other nonvenomous arthropods, initial encounter: Secondary | ICD-10-CM | POA: Diagnosis not present

## 2015-08-11 DIAGNOSIS — S30861A Insect bite (nonvenomous) of abdominal wall, initial encounter: Secondary | ICD-10-CM | POA: Diagnosis not present

## 2015-08-11 DIAGNOSIS — R21 Rash and other nonspecific skin eruption: Secondary | ICD-10-CM | POA: Diagnosis not present

## 2015-08-11 MED ORDER — DOXYCYCLINE HYCLATE 100 MG PO TABS
100.0000 mg | ORAL_TABLET | Freq: Two times a day (BID) | ORAL | Status: DC
Start: 2015-08-11 — End: 2015-09-15

## 2015-08-11 NOTE — Progress Notes (Signed)
Dr. Karleen HampshireSpencer T. Cidney Kirkwood, MD, CAQ Sports [facility] [address] [place], [id number] Phone: [phone] Fax: [phone]  08/11/2015  Patient: Jeffrey GillisBobby G Koeppen, MRN: [id number], DOB: 07/06/1975, 40 y.o.  Primary Physician:  Kerby NoraAmy Bedsole, MD   Chief Complaint  Patient presents with  . Insect Bite    Tick bite-on stomach   Subjective:   Jeffrey GillisBobby G States is a 40 y.o. very pleasant male patient who presents with the following:  The patient had a tick that was small on the left side of his stomach. He removed this about 10 days ago or so, and within the last 24 hours he has developed a rash with centralized clearing around the area of the tick bite.  Past Medical History, Surgical History, Social History, Family History, Problem List, Medications, and Allergies have been reviewed and updated if relevant.  Patient Active Problem List   Diagnosis Date Noted  . Schizoaffective disorder, depressive type (HCC) 02/17/2015  . Vitamin D deficiency 02/14/2015  . Low back pain 10/27/2014  . Thoracic back pain 05/21/2013  . 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 Left leg pain 09/13/2010  . DVT (deep venous thrombosis) (HCC) 07/06/2010  . History of patellar fracture 07/06/2010  . HYPERCHOLESTEROLEMIA 10/13/2009  . PREDIABETES 10/13/2009  . Anxiety state 06/02/2006  . TOBACCO ABUSE 06/02/2006  . DEPRESSION 06/02/2006  . Chronic back pain 06/02/2006    Past Medical History  Diagnosis Date  . Chronic leg pain   . Anxiety   . Depression     Past Surgical History  Procedure Laterality Date  . 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 Bone graft from hip to jaw  2004  . Bone graft right side of head to jaw  2006  . Sphenopalatine ganglionic    . Block procedure at pain center  03/27/06    Social History   Social History  . Marital Status: Single    Spouse Name: N/A  . Number of Children: 0  . Years of Education: N/A   Occupational  History  . Machine Shop Tool and Scientist, forensicDie Maker    Social History Main Topics  . Smoking status: Current Every Day Smoker -- 1.00 packs/day for 10 years    Types: Cigarettes  . Smokeless tobacco: Former NeurosurgeonUser  . Alcohol Use: No  . Drug Use: No  . Sexual Activity: Not Currently   Other Topics Concern  . Not on file   Social History Narrative   In relationship, + sex, + condom   Exercise:  None, running causes head pain   Diet:  FF once daily, + fruits and veggies, + H2O, + Soda    Family History  Problem Relation Age of Onset  . Hyperlipidemia Mother   . Hypertension Mother   . Depression Mother   . Anxiety disorder Mother   . Depression Brother   . Anxiety disorder Brother   . Bipolar disorder Maternal Grandfather   . Schizophrenia Maternal Grandfather     Allergies  Allergen Reactions  . Toradol [Ketorolac Tromethamine] Swelling    Medication list reviewed and updated in full in [facility].  ROS: GEN: Acute illness details above GI: Tolerating PO intake GU: maintaining adequate hydration and urination Pulm: No SOB Interactive and getting along well at home.  Otherwise, ROS is as per the HPI.  Objective:   BP 104/80 mmHg  Pulse 83  Temp(Src) 98 F (36.7 C) (  Oral)  Ht  (1.981 m)  Wt 238 lb 4 oz (108.069 kg)  BMI 27.54 kg/m2   GEN: WDWN, NAD, Non-toxic, Alert & Oriented x 3 HEENT: Atraumatic, Normocephalic.  Ears and Nose: No external deformity. EXTR: No clubbing/cyanosis/edema NEURO: Normal gait.  PSYCH: Normally interactive. Conversant. Not depressed or anxious appearing.  Calm demeanor.    Area where tick bite occurred there is an area of rash approximately 3 centimeters across with some centralized clearing.   Laboratory and Imaging Data:  Assessment and Plan:   Rash and nonspecific skin eruption  Tick bite of abdomen, initial encounter   Rash may represent a bull-eye type lesion. I think that it is prudent to treat this with  antibiotics as if this is a tickborne disease.  Follow-up: No Follow-up on file.  New Prescriptions   DOXYCYCLINE (VIBRA-TABS) 100 MG TABLET    Take 1 tablet (100 mg total) by mouth 2 (two) times daily.   Signed,  Elpidio Galea. Arnitra Sokoloski, MD   Patient's Medications  New Prescriptions   DOXYCYCLINE (VIBRA-TABS) 100 MG TABLET    Take 1 tablet (100 mg total) by mouth 2 (two) times daily.  Previous Medications   ALPRAZOLAM (XANAX XR) 2 MG 24 HR TABLET    Take 2 mg by mouth every morning.   AMANTADINE (SYMMETREL) 100 MG CAPSULE    Take 100 mg by mouth 2 (two) times daily.   ATORVASTATIN (LIPITOR) 40 MG TABLET    Take 1 tablet (40 mg total) by mouth daily.   CLONAZEPAM (KLONOPIN) 1 MG TABLET    Take 1 mg by mouth 2 (two) times daily.    DICLOFENAC (VOLTAREN) 75 MG EC TABLET    TAKE 1 TABLET (75 MG TOTAL) BY MOUTH 2 (TWO) TIMES DAILY. WITH FOOD.   GABAPENTIN (NEURONTIN) 600 MG TABLET    Take 1,200-1,800 mg by mouth See admin instructions. Take either two tablets ( ) three times a day or three tablets ( ) two times a day.   HYDROXYZINE (VISTARIL) 25 MG CAPSULE    TAKE 1 CAPSULE BY MOUTH 4 TIMES A DAY   METHOCARBAMOL (ROBAXIN) 500 MG TABLET    Take 1 tablet (500 mg total) by mouth every 8 (eight) hours as needed for muscle spasms.   PROPRANOLOL (INDERAL) 20 MG TABLET    Take 20 mg by mouth 3 (three) times daily.   SAPHRIS 10 MG SUBL       VITAMIN D, ERGOCALCIFEROL, (DRISDOL) 50000 UNITS CAPS CAPSULE    Take 1 capsule (50,000 Units total) by mouth every 7 (seven) days.  Modified Medications   No medications on file  Discontinued Medications   ARIPIPRAZOLE (ABILIFY) 30 MG TABLET    Take 1 tablet (30 mg total) by mouth daily.   BENZTROPINE (COGENTIN) 1 MG TABLET    Take 1 tablet by mouth 2 (two) times daily.   PRISTIQ 100 MG 24 HR TABLET       VITAMIN D, ERGOCALCIFEROL, (DRISDOL) 50000 UNITS CAPS CAPSULE

## 2015-08-11 NOTE — Progress Notes (Signed)
Pre visit review using our clinic review tool, if applicable. No additional management support is needed unless otherwise documented below in the visit note. 

## 2015-08-29 ENCOUNTER — Other Ambulatory Visit: Payer: Self-pay | Admitting: Family Medicine

## 2015-08-29 NOTE — Telephone Encounter (Signed)
Since this is Dr. Senaida LangeB's patient who is in the office right now, I will defer to her judgement. I cannot see where I have seen or evaluated him for any condition that would require Robaxin.

## 2015-09-05 ENCOUNTER — Ambulatory Visit (INDEPENDENT_AMBULATORY_CARE_PROVIDER_SITE_OTHER): Payer: Managed Care, Other (non HMO) | Admitting: Podiatry

## 2015-09-05 ENCOUNTER — Encounter: Payer: Self-pay | Admitting: Podiatry

## 2015-09-05 VITALS — BP 127/83 | HR 86 | Resp 18

## 2015-09-05 DIAGNOSIS — B351 Tinea unguium: Secondary | ICD-10-CM | POA: Diagnosis not present

## 2015-09-05 NOTE — Progress Notes (Signed)
   Subjective:    Patient ID: Jeffrey Lin, male    DOB: 07/01/1975, 40 y.o.   MRN: [id number]  HPI  40 year old male presents the office concerns of a black spot form is right big toenail and the nail did fall off. Since this grew back he has noticed the nail has become thick and discolored and is currently in. The areas tender and sore to touch. Denies any recent treatment. No other complaints at this time.redness or drainage.   Review of Systems  All other systems reviewed and are negative.      Objective:   Physical Exam General: AAO x3, NAD  Dermatological: Right hallux nails hypertrophic, distal, discolored and there is mild to palpation to the nail. There is no incurvation. There is no edema, erythema, drainage or other signs of infection. Remaining nails are somewhat discolored. No open lesions or pre-ulcerative lesions of them for this time.  Vascular: Dorsalis Pedis artery and Posterior Tibial artery pedal pulses are 2/4 bilateral with immedate capillary fill time. Pedal hair growth present. There is no pain with calf compression, swelling, warmth, erythema.   Neruologic: Grossly intact via light touch bilateral. Vibratory intact via tuning fork bilateral. Protective threshold with Semmes Wienstein monofilament intact to all pedal sites bilateral.  Musculoskeletal: No gross boney pedal deformities bilateral. No pain, crepitus, or limitation noted with foot and ankle range of motion bilateral. Muscular strength 5/5 in all groups tested bilateral.  Gait: Unassisted, Nonantalgic.      Assessment & Plan:  40 year old male right hallux onychodystrophy likely onychomycosis -Treatment options discussed including all alternatives, risks, and complications -Etiology of symptoms were discussed -At this time the nails nontender. Discussed the nail avulsion however since his nontender cisternal hold off. The nails debrided and [facility] of onych[facility]osis.  Discussed treatment options were onychomycosis over the results of the culture before proceeding with definitive treatment.  Ovid CurdMatthew Wagoner, DPM

## 2015-09-15 ENCOUNTER — Encounter: Payer: Self-pay | Admitting: Family Medicine

## 2015-09-15 ENCOUNTER — Ambulatory Visit (INDEPENDENT_AMBULATORY_CARE_PROVIDER_SITE_OTHER): Payer: Managed Care, Other (non HMO) | Admitting: Family Medicine

## 2015-09-15 VITALS — BP 120/90 | HR 70 | Temp 98.3°F | Ht 78.0 in | Wt 245.2 lb

## 2015-09-15 DIAGNOSIS — R21 Rash and other nonspecific skin eruption: Secondary | ICD-10-CM | POA: Insufficient documentation

## 2015-09-15 MED ORDER — TRIAMCINOLONE ACETONIDE 0.1 % EX CREA: 1.0000 "application " | TOPICAL_CREAM | Freq: Two times a day (BID) | CUTANEOUS | 0 refills | Status: DC

## 2015-09-15 NOTE — Progress Notes (Signed)
   Subjective:    Patient ID: Jeffrey Lin, male    DOB: 04/16/1975, 40 y.o.   MRN: [id number]  HPI   40 year old male presents with new onset red bumps on left upper thigh and across low abdomen.  Itchy.  No blisters, no pustules.  No associated symptoms.  No new exposures except new med ( psych med note sure which) in last week:  No new creams or lotions.  Few days ago mowed yard but no poison ivy exposure known.  Has not treated with cream or other.   Lives with mom.. She does not have any symptoms. Has 2 dogs lie on bed. Review of Systems     Objective:   Physical Exam  Constitutional: Vital signs are normal. He appears well-developed and well-nourished.  HENT:  Head: Normocephalic.  Right Ear: Hearing normal.  Left Ear: Hearing normal.  Nose: Nose normal.  Mouth/Throat: Oropharynx is clear and moist and mucous membranes are normal.  Neck: Trachea normal. Carotid bruit is not present. No thyroid mass and no thyromegaly present.  Cardiovascular: Normal rate, regular rhythm and normal pulses.  Exam reveals no gallop, no distant heart sounds and no friction rub.   No murmur heard. No peripheral edema  Pulmonary/Chest: Effort normal and breath sounds normal. No respiratory distress.  Skin: Skin is warm, dry and intact. Rash noted. Rash is papular. Rash is not pustular, not vesicular and not urticarial.     Psychiatric: He has a normal mood and affect. His speech is normal and behavior is normal. Thought content normal.          Assessment & Plan:

## 2015-09-15 NOTE — Assessment & Plan Note (Signed)
Concnerning for bite.. Likely fleas, eval dogs.  If not improving consider scabies (although less likely given not very itchy, no linear) or bedbugs.  Treat with topical steroid.

## 2015-09-15 NOTE — Patient Instructions (Signed)
Apply steroid cream to rash twice daily.  Evaluate dogs for fleas.  Call if spreading and not improving.

## 2015-09-15 NOTE — Progress Notes (Signed)
Pre visit review using our clinic review tool, if applicable. No additional management support is needed unless otherwise documented below in the visit note. 

## 2015-09-18 ENCOUNTER — Other Ambulatory Visit (HOSPITAL_COMMUNITY): Payer: 59 | Attending: Psychiatry | Admitting: Psychiatry

## 2015-09-18 ENCOUNTER — Encounter (HOSPITAL_COMMUNITY): Payer: Self-pay | Admitting: Psychiatry

## 2015-09-18 DIAGNOSIS — F17210 Nicotine dependence, cigarettes, uncomplicated: Secondary | ICD-10-CM | POA: Diagnosis not present

## 2015-09-18 DIAGNOSIS — F251 Schizoaffective disorder, depressive type: Secondary | ICD-10-CM

## 2015-09-18 DIAGNOSIS — F419 Anxiety disorder, unspecified: Secondary | ICD-10-CM | POA: Insufficient documentation

## 2015-09-18 NOTE — Progress Notes (Signed)
Comprehensive Clinical Assessment (CCA) Note  09/18/2015 Jeffrey GillisBobby G Lin [id number]  Visit Diagnosis:   No diagnosis found.    CCA Part One  Part One has been completed on paper by the patient.  (See scanned document in Chart Review)  CCA Part Two A  Intake/Chief Complaint:  CCA Intake With Chief Complaint CCA Part Two Date: 09/18/15 CCA Part Two Time: 1331 Chief Complaint/Presenting Problem: This is a 40 yr old single, unemployed, Caucasian male, who was referred per Dr. Evelene CroonKaur; treatment for persistent depressive and anxiety symptoms.  Denies SI/HI, or A/V hallucinations.  Admits to daily panic attacks and paranoia.  States he feels that people are talking about him and watching him.   Pt was previously in MH-IOP 02-16-14 thru 03-08-14 due to depression and anxiety, with SI.   According to pt, once he completed MH-IOP last yr; he started attending [facility].   Apparently, pt started decompensating before Thanksgiving 2016.  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  Pt states he "lost it at work last November."  Dr. Evelene CroonKaur took pt out of work on [employer]ML[employer].  Pt currently trying to get long term disability now.  Pt mentioned he worked with six women.  All were African American except one, "but she's married to a black man."  Pt states it has nothing to do with their race, but there's a lot of conflict there.  "They were always keeping something stirred up."  Pt states the company closed April 21, 2015 and sent all their work to the [place].  According to pt, he received severance pay.  Since pt isn't working, he has no structure during the day.  Has been increasingly isolative.  2)  Girlfriend of one year was in a MVA last yr and hurt her legs and one isn't healing well.  [place] is currently in [place] taking care of her ill Grandfather.  Pt states he worries a lot about her.  Pt states he has been admitted on a psychiatric unit twice [facility]  Pt has been seeing Dr. Evelene CroonKaur for eight years and started seeing Hurley CiscoBarbara Fousek, LCSW last year.  Pt reports he will be going to see Dr. Toni Amendlapacs on 09-22-15 re:  ECT option.  Denies any prior suicide attempts or gestures.  Family Hx:  Maternal GF (Bipolar; Schizophrenia:  Committed suicide); Mother and Brother (Depression & Anxiety).  Patients Currently Reported Symptoms/Problems: decreased motivation, no energy, anhedonia, indecisiveness, ruminating thoughts, irritability, paranoid thoughts (feeling people are watching him), isolative, decreased appetite, poor concentration.   Collateral Involvement: Parents are supportive. Individual's Strengths: Pt is motivated for treatment.  Individual's Preferences: Structured environment.  Mental Health Symptoms Depression:  Depression: Change in energy/activity, Difficulty Concentrating, Irritability, Sleep (too much or little), Tearfulness  Mania:  Mania: N/A  Anxiety:   Anxiety: N/A  Psychosis:  Psychosis: Delusions  Trauma:  Trauma: N/A  Obsessions:  Obsessions: N/A  Compulsions:  Compulsions: N/A  Inattention:  Inattention: N/A  Hyperactivity/Impulsivity:     Oppositional/Defiant Behaviors:  Oppositional/Defiant Behaviors: N/A  Borderline Personality:  Emotional Irregularity: N/A  Other Mood/Personality Symptoms:      Mental Status Exam Appearance and self-care  Stature:  Stature: Tall  Weight:  Weight: Average weight  Clothing:  Clothing: Casual  Grooming:  Grooming: Normal  Cosmetic use:  Cosmetic Use: None  Posture/gait:  Posture/Gait: Normal  Motor activity:  Motor Activity: Not Remarkable  Sensorium  Attention:  Attention: Normal  Concentration:     Orientation:   Orientation: X5  Recall/memory:  Recall/Memory: Normal  Affect and Mood  Affect:  Affect: Depressed, Blunted  Mood:  Mood: Anxious  Relating  Eye contact:  Eye Contact: Normal  Facial expression:  Facial Expression: Depressed  Attitude toward examiner:  Attitude Toward Examiner: Cooperative  Thought and Language  Speech flow: Speech Flow: Normal  Thought content:  Thought Content: Delusions  Preoccupation:  Preoccupations: Ruminations  Hallucinations:     Organization:     Company secretary of Knowledge:  Fund of Knowledge: Average  Intelligence:  Intelligence: Average  Abstraction:  Abstraction: Functional  Judgement:  Judgement: Normal  Reality Testing:  Reality Testing: Distorted  Insight:  Insight: Gaps  Decision Making:  Decision Making: Normal  Social Functioning  Social Maturity:  Social Maturity: Isolates  Social Judgement:  Social Judgement: Normal  Stress  Stressors:  Stressors: Grief/losses  Coping Ability:  Coping Ability: Building surveyor Deficits:     Supports:      Family and Psychosocial History: Family history Marital status: Single (In a one yr relationship) Are you sexually active?: No What is your sexual orientation?: Heterosexual Does patient have children?: Yes How many children?: 1  Childhood History:  Childhood History By whom was/is the patient raised?: Both parents Additional childhood history information: Born in [place], [place].  States father was very strict.  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  Pt states he was an average student, but was bullied due to being a red head.  States his older brother bullied him also.  Pt states he was also in a special speech and reading class. Description of patient's relationship with caregiver when they were a child: Father was authoritarian. Does patient have siblings?:  Yes Number of Siblings: 4 (all brothers) Description of patient's current relationship with siblings: Closest to older brother now. Did patient suffer any verbal/emotional/physical/sexual abuse as a child?: No Did patient suffer from severe childhood neglect?: No Has patient ever been sexually abused/assaulted/raped as an adolescent or adult?: No Was the patient ever a victim of a crime or a disaster?: No Witnessed domestic violence?: No Has patient been effected by domestic violence as an adult?: No  CCA Part Two B  Employment/Work Situation: Employment / Work Psychologist, occupational  Employment situation: On disability Why is patient on disability: Mental Illness How long has patient been on disability: FMLA since November 2016 What is the longest time patient has a held a job?: 14 yrs Where was the patient employed at that time?: [employer] Has patient ever been in the [employer]?: No Has patient ever served in combat?: No Did You Receive Any Psychiatric Treatment/Services While in Equities trader?: No Are There Guns or Other Weapons in Your Home?: No Are These Comptroller?: Yes  Education: Education Last Grade Completed: 12 Did Garment/textile technologist From [employer]?: Yes Did Theme park manager?: Yes What Type of College Degree Do you Have?: Assoc Degree in Architect Techn Did You Attend Graduate School?: No Did You Have An Individualized Education Program (IIEP): No Did You Have Any Difficulty At School?: No  Religion: Religion/Spirituality Are You A Religious Person?: Yes What is Your Religious Affiliation?: Unknown  Leisure/Recreation: Leisure / Recreation Leisure and Hobbies: basketball, fishing  Exercise/Diet: Exercise/Diet Do You Exercise?: No Have You Gained or Lost A Significant Amount of Weight in the Past Six Months?: No Do You Follow a Special Diet?: No Do You Have Any Trouble Sleeping?: No  CCA Part Two C  Alcohol/Drug Use: Alcohol / Drug Use Pain  Medications: Pt admits to abusing pain medications; after having jaw surgery in 2004.  States he would abuse whatever he could get his hands on.  Last use was three yrs ago. History of alcohol / drug use?: Yes Longest period of sobriety (when/how long): Three yrs. Negative Consequences of Use: Personal relationships, Work / Programmer, multimedia, Armed forces operational officer, Surveyor, quantity Withdrawal Symptoms: Agitation, Irritability                      CCA Part Three  ASAM's:  Six Dimensions of Multidimensional Assessment  Dimension 1:  Acute Intoxication and/or Withdrawal Potential:     Dimension 2:  Biomedical Conditions and Complications:     Dimension 3:  Emotional, Behavioral, or Cognitive Conditions and Complications:     Dimension 4:  Readiness to Change:     Dimension 5:  Relapse, Continued use, or Continued Problem Potential:     Dimension 6:  Recovery/Living Environment:      Substance use Disorder (SUD)    Social Function:  Social Functioning Social Maturity: Isolates Social Judgement: Normal  Stress:  Stress Stressors: Grief/losses Coping Ability: Overwhelmed Patient Takes Medications The Way The Doctor Instructed?: Yes Priority Risk: Moderate Risk  Risk Assessment- Self-Harm Potential: Risk Assessment For Self-Harm Potential Thoughts of Self-Harm: No current thoughts Method: No plan Availability of Means: No access/NA Additional Information for Self-Harm Potential: Family History of Suicide  Risk Assessment -Dangerous to Others Potential: Risk Assessment For Dangerous to Others Potential Method: No Plan Availability of Means: No access or NA Intent: Vague intent or NA Notification Required: No need or identified person  DSM5 Diagnoses: Patient Active Problem List   Diagnosis Date Noted  . Rash and nonspecific skin eruption 09/15/2015  . Schizoaffective disorder, depressive type (HCC) 02/17/2015  . Vitamin D deficiency 02/14/2015  . Low back pain 10/27/2014  . Thoracic back pain  05/21/2013  . 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 Left leg pain 09/13/2010  . DVT (deep venous thrombosis) (HCC) 07/06/2010  . History of patellar fracture 07/06/2010  . HYPERCHOLESTEROLEMIA 10/13/2009  . PREDIABETES 10/13/2009  . Anxiety state 06/02/2006  . TOBACCO ABUSE 06/02/2006  . DEPRESSION 06/02/2006  . Chronic back pain 06/02/2006    Patient  Centered Plan: Patient is on the following Treatment Plan(s):  Anxiety and Depression  Recommendations for Services/Supports/Treatments: Recommendations for Services/Supports/Treatments Recommendations For Services/Supports/Treatments: IOP (Intensive Outpatient Program)  Treatment Plan Summary:  Pt will attend MH-IOP on a daily basis.  He will participate in group therapy along with psycho-educational groups.  Encouraged support groups.  F/U with [facility].  F/U with Dr. Evelene Croon and Hurley Cisco, LCSW.  Strongly recommend Aftercare Group at [facility] with Beth once pt completes MH-IOP.  Referrals to Alternative Service(s): Referred to Alternative Service(s):   Place:   Date:   Time:    Referred to Alternative Service(s):   Place:   Date:   Time:    Referred to Alternative Service(s):   Place:   Date:   Time:    Referred to Alternative Service(s):   Place:   Date:   Time:     Kendria Halberg, RITA, M.Ed, CNA

## 2015-09-18 NOTE — Progress Notes (Signed)
Daily Group Progress Note                                                     Program: IOP      Time: 9:00-12:00 Activity Level: active Behavioral Response: engaged, responsive Type of Therapy: Psychoeducation/Group Therapy Summary: Patient presented with a very optimistic theme in group today. The Pharmacist shared the different aspects of psych. medications, and answered patient's questions about medication use. Pt participated in a discussion on "Asking for help." Therapist encouraged pt to incorporate self-care (asking for help) for her mental wellness.      Shary Lamos, LCAS-A     

## 2015-09-19 ENCOUNTER — Encounter (HOSPITAL_COMMUNITY): Payer: Self-pay | Admitting: Psychiatry

## 2015-09-19 ENCOUNTER — Other Ambulatory Visit (HOSPITAL_COMMUNITY): Payer: 59 | Admitting: Psychiatry

## 2015-09-19 DIAGNOSIS — F251 Schizoaffective disorder, depressive type: Secondary | ICD-10-CM | POA: Diagnosis not present

## 2015-09-19 NOTE — Progress Notes (Signed)
    Daily Group Progress Note  Program: IOP  Group Time: 9:00-12:00   Participation Level:  minimal   Behavioral Response:  responsive   Type of Therapy:  group therapy   Summary of Progress: Client interacted with group when asked direct questions.  The client stated he gets regular sleep.  The client states he gets tattoos when he is feeling "pain on the inside" and plans on getting another this week.  The client tries distraction when having suicidal thoughts, but states distraction does not work for long.  The client will learn other coping strategies in group.  The counselor discussed the "safe place" the client goes to in her mind when she needs to relax.  The client drew this place and described it to the group.  Client saw the psychiatrist.      [name], Jennifer B, [facility]

## 2015-09-19 NOTE — Progress Notes (Signed)
Psychiatric Initial Adult Assessment   Patient Identification: Jeffrey Lin MRN:  [id number] Date of Evaluation:  09/19/2015 Referral Source: Dr Evelene Croon Chief Complaint: persistent depression   Visit Diagnosis: schizoaffective disorder depressive type  History of Present Illness:  Jeffrey Lin was in the IOP earlier this year and the issues are essentially the same as then.  He has been depressed for many years with psychotic features diagnosed as schizoaffective disorder.  No medication or therapy has ever helped much.  He also has anxiety and anxiety attacks that respond somewhat to medication.  He as ADHD like symptoms but it is hard to separate these from the depression and anxiety.  He has a history of misusing pain killers as well.  In spite of all these issues he has been able to hold down a job till the company he was working for [place].  He has not worked since.  He is home with too much time on his hands and  Continues feeling depressed and paranoid that people are lokking at him and seeing people who are not there.  He is close to his mother and got a dog that has helped with companionship.  He has a girlfriend who is helping her father in [place].  Overall he remains depressed feeling no motivation, interest, energy, no pleasure in activities, hopeless, as well as seeing things and worrying all the time.  His doctor has recommended an evaluation for ECT and he says he is ready to try that.  He is applying for disability.  Associated Signs/Symptoms: Depression Symptoms:  depressed mood, anhedonia, hypersomnia, fatigue, feelings of worthlessness/guilt, hopelessness, anxiety, (Hypo) Manic Symptoms:  Irritable Mood, Anxiety Symptoms:  Excessive Worry, Psychotic Symptoms:  Paranoia, PTSD Symptoms: Negative  Past Psychiatric History: at least 2 hospitalizations  Previous Psychotropic Medications: Yes   Substance Abuse History in the last 12 months:   No.  Consequences of Substance Abuse: Negative  Past Medical History:  Past Medical History:  Diagnosis Date  . Anxiety   . Chronic leg pain   . Depression   . Schizoaffective disorder [facility])     Past Surgical History:  Procedure Laterality Date  . Block procedure at pain center  03/27/06  . Bone graft from hip to jaw  2004  . Bone graft right side of head to jaw  2006  . 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 Sphenopalatine ganglionic      Family Psychiatric History: of the five boys he is the only one with problems.  One grandfather had bipolar and probably schizophrenia  Family History:  Family History  Problem Relation Age of Onset  . Hyperlipidemia Mother   . Hypertension Mother   . Depression Mother   . Anxiety disorder Mother   . Depression Brother   . Anxiety disorder Brother   . Bipolar disorder Maternal Grandfather   . Schizophrenia Maternal Grandfather   . Bipolar disorder Paternal Grandfather     Social History:   Social History   Social History  . Marital status: Single    Spouse name: N/A  . Number of children: 0  . Years of education: N/A   Occupational History  . [employer]   Social History Main Topics  . Smoking status: Current Every Day Smoker    Packs/day: 0.50    Years: 10.00    Types: Cigarettes  . Smokeless tobacco: Former Neurosurgeon  .  Alcohol use No  . Drug use: No  . Sexual activity: Not Currently   Other Topics Concern  . None   Social History Narrative   In relationship, + sex, + condom   Exercise:  None, running causes head pain   Diet:  FF once daily, + fruits and veggies, + H2O, + Soda    Additional Social History: none  Allergies:   Allergies  Allergen Reactions  . Toradol [Ketorolac Tromethamine] Swelling    Metabolic Disorder Labs: No results found for: HGBA1C, MPG No results found for: PROLACTIN Lab Results  Component Value Date   CHOL 142 05/10/2015   TRIG 247.0  (H) 05/10/2015   HDL 25.60 (L) 05/10/2015   CHOLHDL 6 05/10/2015   VLDL 49.4 (H) 05/10/2015   LDLCALC 102 (H) 09/28/2010   LDLCALC 139 (H) 10/10/2009     Current Medications: Current Outpatient Prescriptions  Medication Sig Dispense Refill  . amantadine (SYMMETREL) 100 MG capsule Take 100 mg by mouth 2 (two) times daily.  5  . atorvastatin (LIPITOR) 40 MG tablet Take 1 tablet (40 mg total) by mouth daily. 90 tablet 3  . benztropine (COGENTIN) 1 MG tablet     . clonazePAM (KLONOPIN) 1 MG tablet Take 1 mg by mouth 2 (two) times daily.   3  . diazepam (VALIUM) 10 MG tablet     . diclofenac (VOLTAREN) 75 MG EC tablet TAKE 1 TABLET (75 MG TOTAL) BY MOUTH 2 (TWO) TIMES DAILY. WITH FOOD. 60 tablet 2  . gabapentin (NEURONTIN) 600 MG tablet Take 1,200-1,800 mg by mouth See admin instructions. Take either two tablets (1200mg ) three times a day or three tablets (1800mg ) two times a day.    . hydrOXYzine (VISTARIL) 25 MG capsule TAKE 1 CAPSULE BY MOUTH 4 TIMES A DAY  5  . lamoTRIgine (LAMICTAL) 200 MG tablet Take 200 mg by mouth daily.  12  . methocarbamol (ROBAXIN) 500 MG tablet TAKE 1 TABLET (500 MG TOTAL) BY MOUTH EVERY 8 (EIGHT) HOURS AS NEEDED FOR MUSCLE SPASMS. 90 tablet 0  . OLANZapine-FLUoxetine (SYMBYAX) 6-25 MG capsule     . propranolol (INDERAL) 20 MG tablet Take 20 mg by mouth 3 (three) times daily.  2  . triamcinolone cream (KENALOG) 0.1 % Apply 1 application topically 2 (two) times daily. 30 g 0  . Vitamin D, Ergocalciferol, (DRISDOL) 50000 units CAPS capsule Take 1 capsule (50,000 Units total) by mouth every 7 (seven) days. 12 capsule 0   No current facility-administered medications for this visit.     Neurologic: Headache: Negative Seizure: Negative Paresthesias:Negative  Musculoskeletal: Strength & Muscle Tone: within normal limits Gait & Station: normal Patient leans: N/A  Psychiatric Specialty Exam: ROS  There were no vitals taken for this visit.There is no height or  weight on file to calculate BMI.  General Appearance: Fairly Groomed  Eye Contact:  Good  Speech:  Clear and Coherent  Volume:  Normal  Mood:  Depressed  Affect:  Congruent  Thought Process:  Coherent  Orientation:  Full (Time, Place, and Person)  Thought Content:  Logical  Suicidal Thoughts:  No  Homicidal Thoughts:  No  Memory:  Immediate;   Good Recent;   Good Remote;   Good  Judgement:  Intact  Insight:  Fair  Psychomotor Activity:  Normal  Concentration:  Concentration: Good and Attention Span: Good  Recall:  Good  Fund of Knowledge:Good  Language: Good  Akathisia:  Negative  Handed:  Right  AIMS (if indicated):  0  Assets:  Communication Skills Desire for Improvement Financial Resources/Insurance Housing Physical Health Talents/Skills Transportation  ADL's:  Intact  Cognition: WNL  Sleep:  poor    Treatment Plan Summary: Daily group therapy  Carolanne Grumbling, MD 8/29/201712:24 PM

## 2015-09-20 ENCOUNTER — Other Ambulatory Visit (HOSPITAL_COMMUNITY): Payer: 59 | Admitting: Psychiatry

## 2015-09-20 DIAGNOSIS — F251 Schizoaffective disorder, depressive type: Secondary | ICD-10-CM

## 2015-09-20 NOTE — Progress Notes (Signed)
    Daily Group Progress Note  Program: IOP   Group Time: 9:00-12:00   Participation Level:  active   Behavioral Response: engaged   Type of Therapy:  group therapy   Summary of Progress: The client was active and engaged during group.  Client discussed his use of adult coloring books and music as stress-relief tools. Counselor encouraged client to continue to use these tools.  Client stated his father is becoming more involved with his treatment of his mental illness which makes the client feel good and less like a failure in his father's eyes.  Counselor encouraged client to use visualization of his safe place, drawn the day before, and meditation in moments when relaxation is needed.  Counselor showed group a Ted-Talk about positive psychology and ways to retrain our brains to think more positively.  Counselor challenged the client to try one suggestion for two minutes each day for three weeks.  Client stated he was will to try.  [name], Raja Caputi B, LPC

## 2015-09-21 ENCOUNTER — Other Ambulatory Visit (HOSPITAL_COMMUNITY): Payer: 59 | Admitting: Psychiatry

## 2015-09-21 DIAGNOSIS — F251 Schizoaffective disorder, depressive type: Secondary | ICD-10-CM | POA: Diagnosis not present

## 2015-09-22 ENCOUNTER — Encounter: Payer: Self-pay | Admitting: Psychiatry

## 2015-09-22 ENCOUNTER — Other Ambulatory Visit (HOSPITAL_COMMUNITY): Payer: 59 | Admitting: Psychiatry

## 2015-09-22 ENCOUNTER — Ambulatory Visit (INDEPENDENT_AMBULATORY_CARE_PROVIDER_SITE_OTHER): Payer: 59 | Admitting: Psychiatry

## 2015-09-22 VITALS — BP 124/85 | HR 83 | Temp 97.2°F | Ht 78.0 in | Wt 241.0 lb

## 2015-09-22 DIAGNOSIS — F251 Schizoaffective disorder, depressive type: Secondary | ICD-10-CM

## 2015-09-22 DIAGNOSIS — F331 Major depressive disorder, recurrent, moderate: Secondary | ICD-10-CM

## 2015-09-22 DIAGNOSIS — F411 Generalized anxiety disorder: Secondary | ICD-10-CM

## 2015-09-22 NOTE — Progress Notes (Signed)
    Daily Group Progress Note  Program: IOP  Group Time: 9:00-12:00  Participation Level: Active  Behavioral Response: Appropriate  Type of Therapy:  Group Therapy  Summary of Progress: Pt. Continues to present with primarily flat affect, but talkative, engaged in the group process. Pt. Discussed his mother as primary member of his support system. Pt. Discussed desiring his father's acceptance and progress that he has made with his father with acceptance of his mental illness. Pt. Participated in discussion about mindfulness and developing awareness for how we are affected by sensory stimulation. Pt. Discussed his relationship to music and drawing as coping behaviors. Pt. Participated in discussion about the use of aromatherapy as a complement to treatment for depression. Pt. Watched and discussed video about use of vulnerability for coping.       [name], Annaliah Rivenbark B, LPC

## 2015-09-22 NOTE — Progress Notes (Signed)
Psychiatric Initial Adult Assessment   Patient Identification: Jeffrey Lin MRN:  [id number] Date of Evaluation:  09/22/2015 Referral Source: Dr Evelene Croon Chief Complaint:   Chief Complaint    Establish Care; Anxiety; Depression     Visit Diagnosis:    ICD-9-CM ICD-10-CM   1. Schizoaffective disorder, depressive type (HCC) 295.70 F25.1   2. Moderate episode of recurrent major depressive disorder (HCC) 296.32 F33.1   3. Anxiety state 300.00 F41.1     History of Present Illness:  40 year old man with a long history of mental health problems presents for ECT consultation. Current symptoms include general sense of anxiety which is present almost all of the time, labile and dysphoric mood with rapid swings between depression and anger, poor functioning socially, frequent visual and auditory hallucinations, sense of hopelessness. Patient has been suffering from chronic mental health problems for years. He is currently on appropriate medication for psychotic depressive symptoms and has been for at least many months. He is also on 2 different benzodiazepines and yet remains consistently anxious. Patient has been referred for ECT evaluation. He has a long-standing diagnosis of schizoaffective disorder or recurrent depression with psychotic features with strong component of anxiety. Patient's focus and concentration has been getting worse. On his current medication he sleeps adequately. He is very inactive with no full-time employment or productive activity most days. Patient does have a history of suicidal ideation but has not acted to kill himself. He does have a history of psychiatric hospitalization and currently is attending the intensive outpatient program. Consideration for ECT given diagnosis and severity of symptoms and lack of response to current treatment.  Medical history includes chronic pain, jaw pain, elevated cholesterol.  Substance abuse history includes past history of concern about abuse  of drugs although he states he is not currently drinking and does not abuse his prescribed medicine.  Social history finds the patient currently living with his biological parents. Not working. Very little social interaction. 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  Currently his mental status shows a adequately groomed gentleman casually dressed passively interactive in the interview. Awake and alert. Adequate eye contact. Speech is decreased in total amount and is slow. It. Thoughts show no sign of obvious psychosis but are slow and somewhat concrete. Patient describes his mood as anxious and depressed. Affect is blunted. Denies suicidal intent although he has had frequent feelings of hopelessness and wishes of being dead. No homicidal ideation. He describes car on [address] voices that he describes as muffled and also frequent experiences of seeing people who shouldn't be there and shadows in the corner of his eyes. Alert and oriented 4. Short and long-term memory intact. Judgment and insight intact.  This is a 40 year old man with schizoaffective disorder or recurrent psychotic depression that has not responded well to appropriate treatment. Based on diagnosis symptoms and poor functioning the patient would be an appropriate candidate for ECT treatment.  I have expressed to the patient that I cannot predict inexact likelihood of improvement given the severity of his anxiety. I did tell him that I thought there was a significant chance that ECT could improve at least part of his symptoms and improve his functioning. We discussed the procedure in detail. Patient and his mother were given opportunity to ask questions. Patient expresses a tentative willingness to proceed.  I reviewed with him his medications. His benzodiazepines and his gabapentin  may prove to be impediments. I have asked him to try to decrease his dose of  gabapentin by half and 2 not take benzodiazepines the night before treatment.  Patient and his mother informed me that he has a metal implant of an orthopedic appliance in his jaw.  I am going to ask for radio gram of his jaw as well as the usual chest x-ray and EKG and lab and urine studies. Patient can tentatively be scheduled to begin treatment next Friday. Orders completed. No prescriptions done.  Associated Signs/Symptoms: Depression Symptoms:  depressed mood, anhedonia, psychomotor retardation, fatigue, hopelessness, impaired memory, suicidal thoughts without plan, panic attacks, (Hypo) Manic Symptoms:  Irritable Mood, Anxiety Symptoms:  Excessive Worry, Psychotic Symptoms:  Hallucinations: Auditory Visual PTSD Symptoms: Negative  Past Psychiatric History: See note above.  Previous Psychotropic Medications: Yes   Substance Abuse History in the last 12 months:  Yes.    Consequences of Substance Abuse: Negative  Past Medical History:  Past Medical History:  Diagnosis Date  . Anxiety   . Chronic leg pain   . Depression   . Schizoaffective disorder [facility](HCC)     Past Surgical History:  Procedure Laterality Date  . Block procedure at pain center  03/27/06  . Bone graft from hip to jaw  2004  . Bone graft right side of head to jaw  2006  . 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 Sphenopalatine ganglionic      Family Psychiatric History: Patient is not aware of any family history of mental illness  Family History:  Family History  Problem Relation Age of Onset  . Hyperlipidemia Mother   . Hypertension Mother   . Depression Mother   . Anxiety disorder Mother   . Depression Brother   . Anxiety disorder Brother   . Bipolar disorder Maternal Grandfather   . Schizophrenia Maternal Grandfather   . Bipolar disorder Paternal Grandfather     Social History:   Social History   Social History  . Marital status: Single    Spouse name: N/A  . Number of  children: 0  . Years of education: N/A   Occupational History  . [employer]   Social History Main Topics  . Smoking status: Current Every Day Smoker    Packs/day: 0.50    Years: 10.00    Types: Cigarettes    Start date: 09/22/1995  . Smokeless tobacco: Former [employer]surgeon[employer]  . Alcohol use No  . Drug use: No  . Sexual activity: Not Currently   Other Topics Concern  . None   Social History Narrative   In relationship, + sex, + condom   Exercise:  None, running causes head pain   Diet:  FF once daily, + fruits and veggies, + H2O, + Soda    Additional Social History: Patient is currently living with his parents who will be available to provide transportation and support  Allergies:   Allergies  Allergen Reactions  . Toradol [Ketorolac Tromethamine] Swelling    Metabolic Disorder Labs: No results found for: HGBA1C, MPG No results found for: PROLACTIN Lab Results  Component Value Date   CHOL 142 05/10/2015   TRIG 247.0 (H) 05/10/2015   HDL 25.60 (L) 05/10/2015   CHOLHDL 6 05/10/2015   VLDL 49.4 (H) 05/10/2015   LDLCALC 102 (H) 09/28/2010   LDLCALC 139 (H) 10/10/2009     Current Medications: Current Outpatient Prescriptions  Medication Sig Dispense Refill  .  amantadine (SYMMETREL) 100 MG capsule Take 100 mg by mouth 2 (two) times daily.  5  . atorvastatin (LIPITOR) 40 MG tablet Take 1 tablet (40 mg total) by mouth daily. 90 tablet 3  . benztropine (COGENTIN) 1 MG tablet     . clonazePAM (KLONOPIN) 1 MG tablet Take 1 mg by mouth 2 (two) times daily.   3  . diazepam (VALIUM) 10 MG tablet     . diclofenac (VOLTAREN) 75 MG EC tablet TAKE 1 TABLET (75 MG TOTAL) BY MOUTH 2 (TWO) TIMES DAILY. WITH FOOD. 60 tablet 2  . gabapentin (NEURONTIN) 600 MG tablet Take 1,200-1,800 mg by mouth See admin instructions. Take either two tablets (1200mg ) three times a day or three tablets (1800mg ) two times a day.    . hydrOXYzine (VISTARIL) 25 MG capsule TAKE  1 CAPSULE BY MOUTH 4 TIMES A DAY  5  . methocarbamol (ROBAXIN) 500 MG tablet TAKE 1 TABLET (500 MG TOTAL) BY MOUTH EVERY 8 (EIGHT) HOURS AS NEEDED FOR MUSCLE SPASMS. 90 tablet 0  . OLANZapine-FLUoxetine (SYMBYAX) 6-25 MG capsule     . propranolol (INDERAL) 20 MG tablet Take 20 mg by mouth 3 (three) times daily.  2  . triamcinolone cream (KENALOG) 0.1 % Apply 1 application topically 2 (two) times daily. 30 g 0  . Vitamin D, Ergocalciferol, (DRISDOL) 50000 units CAPS capsule Take 1 capsule (50,000 Units total) by mouth every 7 (seven) days. 12 capsule 0   No current facility-administered medications for this visit.     Neurologic: Headache: No Seizure: No Paresthesias:No  Musculoskeletal: Strength & Muscle Tone: within normal limits Gait & Station: normal Patient leans: N/A  Psychiatric Specialty Exam: ROS  Blood pressure 124/85, pulse 83, temperature 97.2 F (36.2 C), temperature source Oral, height 6\' 6"  (1.981 m), weight 241 lb (109.3 kg).Body mass index is 27.85 kg/m.  General Appearance: Casual  Eye Contact:  Fair  Speech:  Slow  Volume:  Decreased  Mood:  Depressed  Affect:  Constricted  Thought Process:  Goal Directed  Orientation:  Full (Time, Place, and Person)  Thought Content:  Hallucinations: Auditory Visual  Suicidal Thoughts:  Yes.  without intent/plan  Homicidal Thoughts:  No  Memory:  Immediate;   Good Recent;   Poor Remote;   Fair  Judgement:  Fair  Insight:  Fair  Psychomotor Activity:  Decreased  Concentration:  Concentration: Fair  Recall:  [employer] of Knowledge:Fair  Language: Fair  Akathisia:  No  Handed:  Right  AIMS (if indicated):  None   Assets:  Communication Skills Desire for Improvement Financial Resources/Insurance Housing Social Support  ADL's:  Intact  Cognition: WNL  Sleep:  Adequate     Treatment Plan Summary: Plan ECT plan as noted above   [name], MD 9/1/20176:03 PM

## 2015-09-26 ENCOUNTER — Other Ambulatory Visit: Payer: Self-pay | Admitting: Family Medicine

## 2015-09-26 ENCOUNTER — Other Ambulatory Visit (HOSPITAL_COMMUNITY): Payer: 59 | Attending: Psychiatry | Admitting: Psychiatry

## 2015-09-26 DIAGNOSIS — F251 Schizoaffective disorder, depressive type: Secondary | ICD-10-CM

## 2015-09-26 NOTE — Progress Notes (Signed)
    Daily Group Progress Note  Program: IOP  Group Time: 9:00-10:30  Participation Level: Active  Behavioral Response: Appropriate  Type of Therapy:  Group Therapy  Summary of Progress: Pt. Participated as talkative, engaged in the therapeutic process. Pt. 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 Pt. Discussed his thoughts about ECT and hope that the treatment will help him to reduce his medications. Pt. Discussed history of prescription pill addiction and does not like the way that his anti-anxiety medication makes him feel sluggish and over-medicated. Pt. 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     Group Time: 10:30-12:00  Participation Level:  Active  Behavioral Response: Appropriate  Type of Therapy: Psycho-education Group  Summary of Progress: Pt. Participated in yoga for anxiety and depression facilitated by Forde RadonLeanne Yates, LPC.  [name], Jennifer B, LPC

## 2015-09-27 ENCOUNTER — Other Ambulatory Visit (HOSPITAL_COMMUNITY): Payer: 59 | Admitting: Psychiatry

## 2015-09-27 ENCOUNTER — Other Ambulatory Visit: Payer: Self-pay | Admitting: Family Medicine

## 2015-09-27 ENCOUNTER — Telehealth: Payer: Self-pay | Admitting: Family Medicine

## 2015-09-27 DIAGNOSIS — F251 Schizoaffective disorder, depressive type: Secondary | ICD-10-CM | POA: Diagnosis not present

## 2015-09-27 NOTE — Telephone Encounter (Signed)
Duplicate Request.

## 2015-09-27 NOTE — Telephone Encounter (Signed)
Pt stated the he submitted a refill request via MyChart and it came back that it was denied.  He doesn't know why.  Can you please call him to explain.  Thanks!

## 2015-09-27 NOTE — Telephone Encounter (Signed)
Last office visit 09/15/2015.  Last refilled 08/29/2015 for #90 with no refills.  Ok to refill?

## 2015-09-27 NOTE — Telephone Encounter (Signed)
Spoke with [name] and advised his refill has not been denied.  We just had received three separate request for the refill.  The refill request is in Dr. Daphine DeutscherBedsole's in box to approve on Thursday when she returns to clinic.  [name] states understanding.

## 2015-09-28 ENCOUNTER — Encounter: Payer: Self-pay | Admitting: Family Medicine

## 2015-09-28 ENCOUNTER — Other Ambulatory Visit (HOSPITAL_COMMUNITY): Payer: 59 | Admitting: Psychiatry

## 2015-09-28 DIAGNOSIS — F251 Schizoaffective disorder, depressive type: Secondary | ICD-10-CM | POA: Diagnosis not present

## 2015-09-28 MED ORDER — METHOCARBAMOL 500 MG PO TABS: 500.0000 mg | ORAL_TABLET | Freq: Three times a day (TID) | ORAL | 0 refills | Status: DC | PRN

## 2015-09-28 NOTE — Telephone Encounter (Signed)
Looks like rx was sent this morning

## 2015-09-29 ENCOUNTER — Other Ambulatory Visit (HOSPITAL_COMMUNITY): Payer: 59

## 2015-09-29 ENCOUNTER — Encounter
Admission: RE | Admit: 2015-09-29 | Discharge: 2015-09-29 | Disposition: A | Discharge status: Home / Self Care | Payer: Managed Care, Other (non HMO) | Source: Ambulatory Visit | Attending: Psychiatry | Admitting: Psychiatry

## 2015-09-29 ENCOUNTER — Ambulatory Visit
Admission: RE | Admit: 2015-09-29 | Discharge: 2015-09-29 | Disposition: A | Discharge status: Home / Self Care | Payer: Managed Care, Other (non HMO) | Source: Ambulatory Visit | Attending: Psychiatry | Admitting: Psychiatry

## 2015-09-29 DIAGNOSIS — F17200 Nicotine dependence, unspecified, uncomplicated: Secondary | ICD-10-CM

## 2015-09-29 DIAGNOSIS — Z01812 Encounter for preprocedural laboratory examination: Secondary | ICD-10-CM | POA: Diagnosis present

## 2015-09-29 DIAGNOSIS — Z9889 Other specified postprocedural states: Secondary | ICD-10-CM | POA: Insufficient documentation

## 2015-09-29 DIAGNOSIS — F332 Major depressive disorder, recurrent severe without psychotic features: Secondary | ICD-10-CM | POA: Diagnosis not present

## 2015-09-29 DIAGNOSIS — Z01818 Encounter for other preprocedural examination: Secondary | ICD-10-CM

## 2015-09-29 DIAGNOSIS — Z01810 Encounter for preprocedural cardiovascular examination: Secondary | ICD-10-CM | POA: Insufficient documentation

## 2015-09-29 LAB — URINALYSIS COMPLETE WITH MICROSCOPIC (ARMC ONLY)
BILIRUBIN URINE: NEGATIVE
Bacteria, UA: NONE SEEN
Glucose, UA: NEGATIVE mg/dL
HGB URINE DIPSTICK: NEGATIVE
KETONES UR: NEGATIVE mg/dL
LEUKOCYTES UA: NEGATIVE
NITRITE: NEGATIVE
PH: 6 (ref 5.0–8.0)
PROTEIN: NEGATIVE mg/dL
Specific Gravity, Urine: 1.005 (ref 1.005–1.030)

## 2015-09-29 LAB — BASIC METABOLIC PANEL
ANION GAP: 9 (ref 5–15)
BUN: 10 mg/dL (ref 6–20)
CALCIUM: 9.2 mg/dL (ref 8.9–10.3)
CO2: 28 mmol/L (ref 22–32)
Chloride: 103 mmol/L (ref 101–111)
Creatinine, Ser: 1.05 mg/dL (ref 0.61–1.24)
Glucose, Bld: 86 mg/dL (ref 65–99)
POTASSIUM: 3.7 mmol/L (ref 3.5–5.1)
Sodium: 140 mmol/L (ref 135–145)

## 2015-09-29 LAB — CBC
HEMATOCRIT: 45.4 % (ref 40.0–52.0)
Hemoglobin: 15.9 g/dL (ref 13.0–18.0)
MCH: 33.3 pg (ref 26.0–34.0)
MCHC: 35.1 g/dL (ref 32.0–36.0)
MCV: 94.9 fL (ref 80.0–100.0)
PLATELETS: 244 10*3/uL (ref 150–440)
RBC: 4.78 MIL/uL (ref 4.40–5.90)
RDW: 14.8 % — AB (ref 11.5–14.5)
WBC: 11.1 10*3/uL — AB (ref 3.8–10.6)

## 2015-09-29 NOTE — Progress Notes (Signed)
    Daily Group Progress Note  Program: IOP  Group Time: 9:00-12:00  Participation Level: Active  Behavioral Response: Appropriate  Type of Therapy:  Group Therapy  Summary of Progress: Pt. Presented as quiet, flat affect. Pt. Reported that he was looking forward to evaluation for ECT treatment, some mild anxiety about pain and memory loss associated with the treatment and plans to discuss his concerns with the doctor. Pt. Watched and discussed Jeffrey Lin video about acknowledging and treating psychological trauma.     [name], [name], LPC

## 2015-09-29 NOTE — Progress Notes (Signed)
    Daily Group Progress Note  Program: IOP  Group Time: 9:00-12:00   Participation Level: active  Behavioral Response: engaged   Type of Therapy:  group therapy  Summary of Progress: Client reported feeling okay that day, and feeling relieved that his disability money has arrived. He expressed concern for [place] and her ongoing struggles with an infected broken leg. He also expressed some concern surrounding his upcoming ECT, but remains hopeful he will be able to reduce his medications after his treatments begin. He was receptive to the grounding exercises worksheet presented in the second half of the session .  [name], Icela Glymph B, LPC

## 2015-10-02 ENCOUNTER — Ambulatory Visit: Payer: Self-pay | Admitting: Anesthesiology

## 2015-10-02 ENCOUNTER — Other Ambulatory Visit (HOSPITAL_COMMUNITY): Payer: 59

## 2015-10-02 ENCOUNTER — Encounter
Admission: RE | Admit: 2015-10-02 | Discharge: 2015-10-02 | Disposition: A | Discharge status: Home / Self Care | Payer: Managed Care, Other (non HMO) | Source: Ambulatory Visit | Attending: Psychiatry | Admitting: Psychiatry

## 2015-10-02 ENCOUNTER — Other Ambulatory Visit: Payer: Self-pay | Admitting: Psychiatry

## 2015-10-02 ENCOUNTER — Encounter: Payer: Self-pay | Admitting: Anesthesiology

## 2015-10-02 DIAGNOSIS — F209 Schizophrenia, unspecified: Secondary | ICD-10-CM | POA: Insufficient documentation

## 2015-10-02 DIAGNOSIS — Z888 Allergy status to other drugs, medicaments and biological substances status: Secondary | ICD-10-CM | POA: Diagnosis not present

## 2015-10-02 DIAGNOSIS — F419 Anxiety disorder, unspecified: Secondary | ICD-10-CM | POA: Diagnosis not present

## 2015-10-02 DIAGNOSIS — F17210 Nicotine dependence, cigarettes, uncomplicated: Secondary | ICD-10-CM | POA: Insufficient documentation

## 2015-10-02 DIAGNOSIS — F339 Major depressive disorder, recurrent, unspecified: Secondary | ICD-10-CM | POA: Insufficient documentation

## 2015-10-02 DIAGNOSIS — Z9889 Other specified postprocedural states: Secondary | ICD-10-CM | POA: Insufficient documentation

## 2015-10-02 DIAGNOSIS — Z818 Family history of other mental and behavioral disorders: Secondary | ICD-10-CM | POA: Diagnosis not present

## 2015-10-02 DIAGNOSIS — Z885 Allergy status to narcotic agent status: Secondary | ICD-10-CM | POA: Insufficient documentation

## 2015-10-02 DIAGNOSIS — F3130 Bipolar disorder, current episode depressed, mild or moderate severity, unspecified: Secondary | ICD-10-CM | POA: Diagnosis not present

## 2015-10-02 DIAGNOSIS — R569 Unspecified convulsions: Secondary | ICD-10-CM | POA: Insufficient documentation

## 2015-10-02 MED ORDER — SODIUM CHLORIDE 0.9 % IV SOLN
INTRAVENOUS | Status: DC | PRN
  Administered 2015-10-02: 11:00:00 via INTRAVENOUS

## 2015-10-02 MED ORDER — SODIUM CHLORIDE 0.9 % IV SOLN
500.0000 mL | Freq: Once | INTRAVENOUS | Status: AC
  Administered 2015-10-02: 500 mL via INTRAVENOUS

## 2015-10-02 MED ORDER — SUCCINYLCHOLINE CHLORIDE 20 MG/ML IJ SOLN
INTRAMUSCULAR | Status: DC | PRN
  Administered 2015-10-02: 60 mg via INTRAVENOUS
  Administered 2015-10-02: 140 mg via INTRAVENOUS

## 2015-10-02 MED ORDER — KETOROLAC TROMETHAMINE 30 MG/ML IJ SOLN
INTRAMUSCULAR | Status: AC
  Administered 2015-10-02: 30 mg via INTRAVENOUS
  Filled 2015-10-02: qty 1

## 2015-10-02 MED ORDER — KETOROLAC TROMETHAMINE 30 MG/ML IJ SOLN
30.0000 mg | Freq: Once | INTRAMUSCULAR | Status: DC
  Administered 2015-10-02: 30 mg via INTRAVENOUS

## 2015-10-02 MED ORDER — METHOHEXITAL SODIUM 100 MG/10ML IV SOSY
PREFILLED_SYRINGE | INTRAVENOUS | Status: DC | PRN
  Administered 2015-10-02: 100 mg via INTRAVENOUS
  Administered 2015-10-02: 40 mg via INTRAVENOUS

## 2015-10-02 NOTE — Procedures (Signed)
ECT SERVICES Physician's Interval Evaluation & Treatment Note  Patient Identification: Jeffrey Lin MRN:  [id number] Date of Evaluation:  10/02/2015 TX #: 1  MADRS: 32  MMSE: 30  P.E. 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 Lungs and heart clear.  Psychiatric Interval Note:  Depression  Subjective:  Patient is a 40 y.o. male seen for evaluation for Electroconvulsive Therapy. 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 Follow-up treatment Wednesday. Continue with current dosage   Lungs:  [x]   Clear to auscultation               []  Other:   Heart:    [x]   Regular rhythm             []  irregular rhythm    [x]   Previous H&P reviewed, patient examined and there are NO CHANGES                 []   Previous H&P reviewed, patient examined and there are changes noted.   Mordecai RasmussenJohn Ryson Bacha, MD 9/11/201710:50 AM

## 2015-10-02 NOTE — Anesthesia Preprocedure Evaluation (Signed)
Anesthesia Evaluation  Patient identified by MRN, date of birth, ID band Patient awake    Reviewed: Allergy & Precautions, NPO status , Patient's Chart, lab work & pertinent test results  History of Anesthesia Complications Negative for: history of anesthetic complications  Airway Mallampati: III       Dental   Pulmonary Current Smoker,           Cardiovascular hypertension, Pt. on medications and Pt. on home beta blockers      Neuro/Psych Anxiety Depression Schizophrenia negative neurological ROS     GI/Hepatic negative GI ROS, Neg liver ROS,   Endo/Other  negative endocrine ROS  Renal/GU negative Renal ROS     Musculoskeletal   Abdominal   Peds  Hematology negative hematology ROS (+)   Anesthesia Other Findings   Reproductive/Obstetrics                             Anesthesia Physical Anesthesia Plan  ASA: II  Anesthesia Plan: General   Post-op Pain Management:    Induction: Intravenous  Airway Management Planned: Mask  Additional Equipment:   Intra-op Plan:   Post-operative Plan:   Informed Consent: I have reviewed the patients History and Physical, chart, labs and discussed the procedure including the risks, benefits and alternatives for the proposed anesthesia with the patient or authorized representative who has indicated his/her understanding and acceptance.     Plan Discussed with:   Anesthesia Plan Comments:         Anesthesia Quick Evaluation

## 2015-10-02 NOTE — H&P (Signed)
Jeffrey Lin is an 40 y.o. male.   Chief Complaint: Major depression HPI: History of recurrent severe major depression resistant to medication and therapy. Starting ECT treatment today  Past Medical History:  Diagnosis Date  . Anxiety   . Chronic leg pain   . Depression   . Schizoaffective disorder [facility](HCC)     Past Surgical History:  Procedure Laterality Date  . Block procedure at pain center  03/27/06  . Bone graft from hip to jaw  2004  . Bone graft right side of head to jaw  2006  . 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 Sphenopalatine ganglionic      Family History  Problem Relation Age of Onset  . Hyperlipidemia Mother   . Hypertension Mother   . Depression Mother   . Anxiety disorder Mother   . Depression Brother   . Anxiety disorder Brother   . Bipolar disorder Maternal Grandfather   . Schizophrenia Maternal Grandfather   . Bipolar disorder Paternal Grandfather    Social History:  reports that he has been smoking Cigarettes.  He started smoking about 20 years ago. He has a 5.00 pack-year smoking history. He has quit using smokeless tobacco. He reports that he does not drink alcohol or use drugs.  Allergies:  Allergies  Allergen Reactions  . Toradol [Ketorolac Tromethamine] Swelling  . Lamotrigine Rash     (Not in a hospital admission)  No results found for this or any previous visit (from the past 48 hour(s)). No results found.  Review of Systems  Constitutional: Negative.   HENT: Negative.   Eyes: Negative.   Respiratory: Negative.   Cardiovascular: Negative.   Gastrointestinal: Negative.   Musculoskeletal: Negative.   Skin: Negative.   Neurological: Negative.   Psychiatric/Behavioral: Positive for depression. Negative for hallucinations, memory loss, substance abuse and suicidal ideas. The patient is nervous/anxious and has insomnia.     Blood pressure (!) 147/95, pulse 72, temperature 97.8 F (36.6 C), temperature source Oral, resp. rate  18, height 6\' 6"  (1.981 m), weight 109.3 kg (241 lb), SpO2 100 %. Physical Exam  Nursing note and vitals reviewed. Constitutional: He appears well-developed and well-nourished.  HENT:  Head: Normocephalic and atraumatic.    Eyes: Conjunctivae are normal. Pupils are equal, round, and reactive to light.  Neck: Normal range of motion.  Cardiovascular: Regular rhythm and normal heart sounds.   Respiratory: Effort normal. No respiratory distress.  GI: Soft.  Musculoskeletal: Normal range of motion.  Neurological: He is alert.  Skin: Skin is warm and dry.  Psychiatric: Judgment and thought content normal. His speech is delayed. He is slowed. Cognition and memory are normal. He exhibits a depressed mood.     Assessment/Plan Patient will receive bifrontal treatment today. Decision based on some history of surgery to the vertex of the head and near the mandible.  Jeffrey RasmussenJohn Jerod Mcquain, MD 10/02/2015, 10:47 AM

## 2015-10-02 NOTE — Transfer of Care (Signed)
Immediate Anesthesia Transfer of Care Note  Patient: Linde GillisBobby G Towers  Procedure(s) Performed: * No procedures listed *  Patient Location: PACU  Anesthesia Type:General  Level of Consciousness: sedated and responds to stimulation  Airway & Oxygen Therapy: Patient Spontanous Breathing and Patient connected to face mask oxygen  Post-op Assessment: Report given to RN and Post -op Vital signs reviewed and stable  Post vital signs: Reviewed and stable  Last Vitals:  Vitals:   10/02/15 1112 10/02/15 1114  BP: (!) (P) 132/100 (!) 130/100  Pulse:  88  Resp:  16  Temp: (P) 36.3 C     Last Pain:  Vitals:   10/02/15 0846  TempSrc: Oral         Complications: No apparent anesthesia complications

## 2015-10-02 NOTE — Anesthesia Postprocedure Evaluation (Signed)
Anesthesia Post Note  Patient: Jeffrey Lin  Procedure(s) Performed: * No procedures listed *  Patient location during evaluation: PACU Anesthesia Type: General Level of consciousness: awake and alert Pain management: pain level controlled Vital Signs Assessment: post-procedure vital signs reviewed and stable Respiratory status: spontaneous breathing and respiratory function stable Cardiovascular status: stable Anesthetic complications: no    Last Vitals:  Vitals:   10/02/15 1112 10/02/15 1114  BP: (!) 132/100 (!) 130/100  Pulse: 82 88  Resp: 16 16  Temp: 36.3 C     Last Pain:  Vitals:   10/02/15 0846  TempSrc: Oral                 Saleen Peden K

## 2015-10-02 NOTE — Discharge Instructions (Signed)
1)  The drugs that you have been given will stay in your system until tomorrow so for the       next 24 hours you should not:  A. Drive an automobile  B. Make any legal decisions  C. Drink any alcoholic beverages  2)  You may resume your regular meals upon return home.  3)  A responsible adult must take you home.  Someone should stay with you for a few          hours, then be available by phone for the remainder of the treatment day.  4)  You May experience any of the following symptoms:  Headache, Nausea and a dry mouth (due to the medications you were given),  temporary memory loss and some confusion, or sore muscles (a warm bath  should help this).  If you you experience any of these symptoms let us know on                your return visit.  5)  Report any of the following: any acute discomfort, severe headache, or temperature        greater than 100.5 F.   Also report any unusual redness, swelling, drainage, or pain         at your IV site.    You may report Symptoms to:  ECT PROGRAM- [facility]          Phone: [phone], ECT Department           or Dr. Shary Keylapac's office [phone]  6)  Your next ECT Treatment is Day   Wednesday  Date   September 13  We will call 2 days prior to your scheduled appointment for arrival times.  7)  Nothing to eat or drink after midnight the night before your procedure.  8)  Take Lipitor    With a sip of water the morning of your procedure.  9)  Other Instructions: Call [phone] to cancel the morning of your procedure due         to illness or emergency.  10) We will call within 72 hours to assess how you are feeling.

## 2015-10-03 ENCOUNTER — Other Ambulatory Visit (HOSPITAL_COMMUNITY): Payer: 59

## 2015-10-04 ENCOUNTER — Telehealth (HOSPITAL_COMMUNITY): Payer: Self-pay | Admitting: Psychiatry

## 2015-10-04 ENCOUNTER — Encounter: Payer: Self-pay | Admitting: Anesthesiology

## 2015-10-04 ENCOUNTER — Encounter
Admission: RE | Admit: 2015-10-04 | Discharge: 2015-10-04 | Disposition: A | Discharge status: Home / Self Care | Payer: Managed Care, Other (non HMO) | Source: Ambulatory Visit | Attending: Psychiatry | Admitting: Psychiatry

## 2015-10-04 ENCOUNTER — Other Ambulatory Visit (HOSPITAL_COMMUNITY): Payer: 59 | Admitting: Psychiatry

## 2015-10-04 DIAGNOSIS — F209 Schizophrenia, unspecified: Secondary | ICD-10-CM | POA: Diagnosis not present

## 2015-10-04 DIAGNOSIS — Z818 Family history of other mental and behavioral disorders: Secondary | ICD-10-CM | POA: Diagnosis not present

## 2015-10-04 DIAGNOSIS — F332 Major depressive disorder, recurrent severe without psychotic features: Secondary | ICD-10-CM

## 2015-10-04 DIAGNOSIS — F419 Anxiety disorder, unspecified: Secondary | ICD-10-CM | POA: Diagnosis not present

## 2015-10-04 DIAGNOSIS — F17210 Nicotine dependence, cigarettes, uncomplicated: Secondary | ICD-10-CM | POA: Diagnosis not present

## 2015-10-04 DIAGNOSIS — F339 Major depressive disorder, recurrent, unspecified: Secondary | ICD-10-CM | POA: Insufficient documentation

## 2015-10-04 DIAGNOSIS — Z885 Allergy status to narcotic agent status: Secondary | ICD-10-CM | POA: Diagnosis not present

## 2015-10-04 DIAGNOSIS — Z888 Allergy status to other drugs, medicaments and biological substances status: Secondary | ICD-10-CM | POA: Insufficient documentation

## 2015-10-04 DIAGNOSIS — Z9889 Other specified postprocedural states: Secondary | ICD-10-CM | POA: Diagnosis not present

## 2015-10-04 MED ORDER — KETOROLAC TROMETHAMINE 30 MG/ML IJ SOLN
30.0000 mg | Freq: Once | INTRAMUSCULAR | Status: AC
  Administered 2015-10-04: 09:00:00 via INTRAVENOUS

## 2015-10-04 MED ORDER — KETOROLAC TROMETHAMINE 30 MG/ML IJ SOLN
INTRAMUSCULAR | Status: AC
  Filled 2015-10-04: qty 1

## 2015-10-04 MED ORDER — METHOHEXITAL SODIUM 100 MG/10ML IV SOSY
PREFILLED_SYRINGE | INTRAVENOUS | Status: DC | PRN
  Administered 2015-10-04: 140 mg via INTRAVENOUS

## 2015-10-04 MED ORDER — SUCCINYLCHOLINE CHLORIDE 20 MG/ML IJ SOLN
INTRAMUSCULAR | Status: DC | PRN
  Administered 2015-10-04: 200 mg via INTRAVENOUS

## 2015-10-04 MED ORDER — SODIUM CHLORIDE 0.9 % IV SOLN
INTRAVENOUS | Status: DC | PRN
  Administered 2015-10-04: 11:00:00 via INTRAVENOUS

## 2015-10-04 MED ORDER — METOPROLOL TARTRATE 5 MG/5ML IV SOLN
INTRAVENOUS | Status: DC | PRN
  Administered 2015-10-04: 2 mg via INTRAVENOUS

## 2015-10-04 MED ORDER — SODIUM CHLORIDE 0.9 % IV SOLN
500.0000 mL | Freq: Once | INTRAVENOUS | Status: AC
  Administered 2015-10-04: 500 mL via INTRAVENOUS

## 2015-10-04 NOTE — Anesthesia Preprocedure Evaluation (Signed)
Anesthesia Evaluation  Patient identified by MRN, date of birth, ID band Patient awake    Reviewed: Allergy & Precautions, NPO status , Patient's Chart, lab work & pertinent test results  History of Anesthesia Complications Negative for: history of anesthetic complications  Airway Mallampati: III  TM Distance: >3 FB Neck ROM: Full  Mouth opening: Limited Mouth Opening  Dental  (+) Poor Dentition, Implants   Pulmonary neg sleep apnea, neg COPD, Current Smoker,    breath sounds clear to auscultation- rhonchi (-) wheezing      Cardiovascular Exercise Tolerance: Good Hypertension: hx of HTN, not currently on medication. (-) CAD and (-) Past MI  Rhythm:Regular Rate:Normal     Neuro/Psych Anxiety Depression Schizophrenia negative neurological ROS     GI/Hepatic negative GI ROS, Neg liver ROS,   Endo/Other  negative endocrine ROSneg diabetes  Renal/GU negative Renal ROS     Musculoskeletal negative musculoskeletal ROS (+)   Abdominal (+) - obese,   Peds  Hematology negative hematology ROS (+)   Anesthesia Other Findings Past Medical History: No date: Anxiety No date: Chronic leg pain No date: Depression No date: Schizoaffective disorder [facility](HCC)   Reproductive/Obstetrics                             Anesthesia Physical Anesthesia Plan  ASA: II  Anesthesia Plan: General   Post-op Pain Management:    Induction: Intravenous  Airway Management Planned: Mask  Additional Equipment:   Intra-op Plan:   Post-operative Plan:   Informed Consent: I have reviewed the patients History and Physical, chart, labs and discussed the procedure including the risks, benefits and alternatives for the proposed anesthesia with the patient or authorized representative who has indicated his/her understanding and acceptance.   Dental advisory given  Plan Discussed with: CRNA and  Anesthesiologist  Anesthesia Plan Comments:         Anesthesia Quick Evaluation

## 2015-10-04 NOTE — Discharge Instructions (Signed)
1)  The drugs that you have been given will stay in your system until tomorrow so for the       next 24 hours you should not: ° A. Drive an automobile ° B. Make any legal decisions ° C. Drink any alcoholic beverages ° °2)  You may resume your regular meals upon return home. ° °3)  A responsible adult must take you home.  Someone should stay with you for a few          hours, then be available by phone for the remainder of the treatment day. ° °4)  You May experience any of the following symptoms: ° Headache, Nausea and a dry mouth (due to the medications you were given),  temporary memory loss and some confusion, or sore muscles (a warm bath  should help this).  If you you experience any of these symptoms let us know on                your return visit. ° °5)  Report any of the following: any acute discomfort, severe headache, or temperature        greater than 100.5 F.   Also report any unusual redness, swelling, drainage, or pain         at your IV site. ° °  You may report Symptoms to:  ECT PROGRAM- [place] at [facility] °         Phone: [phone], ECT Department  °         or Dr. Clapac's office [phone] ° °6)  Your next ECT Treatment is Friday, Sept. 15, 2017. ° We will call 2 days prior to your scheduled appointment for arrival times. ° °7)  Nothing to eat or drink after midnight the night before your procedure. ° °8)  Take .     With a sip of water the morning of your procedure. ° °9)  Other Instructions: Call [phone] to cancel the morning of your procedure due         to illness or emergency. ° °10) We will call within 72 hours to assess how you are feeling.  °

## 2015-10-04 NOTE — H&P (Signed)
Jeffrey Lin is an 40 y.o. male.   Chief Linde GillisComplaint: Major depression severe recurrent. HPI: Patient depression second treatment today.  Past Medical History:  Diagnosis Date  . Anxiety   . Chronic leg pain   . Depression   . Schizoaffective disorder [facility](HCC)     Past Surgical History:  Procedure Laterality Date  . Block procedure at pain center  03/27/06  . Bone graft from hip to jaw  2004  . Bone graft right side of head to jaw  2006  . 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 Sphenopalatine ganglionic      Family History  Problem Relation Age of Onset  . Hyperlipidemia Mother   . Hypertension Mother   . Depression Mother   . Anxiety disorder Mother   . Depression Brother   . Anxiety disorder Brother   . Bipolar disorder Maternal Grandfather   . Schizophrenia Maternal Grandfather   . Bipolar disorder Paternal Grandfather    Social History:  reports that he has been smoking Cigarettes.  He started smoking about 20 years ago. He has a 5.00 pack-year smoking history. He has quit using smokeless tobacco. He reports that he does not drink alcohol or use drugs.  Allergies:  Allergies  Allergen Reactions  . Toradol [Ketorolac Tromethamine] Swelling  . Lamotrigine Rash     (Not in a hospital admission)  No results found for this or any previous visit (from the past 48 hour(s)). No results found.  Review of Systems  Constitutional: Negative.   HENT: Negative.   Eyes: Negative.   Respiratory: Negative.   Cardiovascular: Negative.   Gastrointestinal: Negative.   Musculoskeletal: Negative.   Skin: Negative.   Neurological: Negative.   Psychiatric/Behavioral: Positive for depression. Negative for hallucinations, memory loss, substance abuse and suicidal ideas. The patient is not nervous/anxious and does not have insomnia.     Blood pressure (!) 148/103, pulse 72, temperature (!) 96.4 F (35.8 C), temperature source Tympanic, resp. rate 16, weight 111.6 kg (246  lb), SpO2 96 %. Physical Exam  Nursing note and vitals reviewed. Constitutional: He appears well-developed and well-nourished.  HENT:  Head: Normocephalic and atraumatic.  Eyes: Conjunctivae are normal. Pupils are equal, round, and reactive to light.  Neck: Normal range of motion.  Cardiovascular: Regular rhythm and normal heart sounds.   Respiratory: Effort normal. No respiratory distress.  GI: Soft.  Musculoskeletal: Normal range of motion.  Neurological: He is alert.  Skin: Skin is warm and dry.  Psychiatric: His speech is normal and behavior is normal. Judgment and thought content normal. Cognition and memory are normal. He exhibits a depressed mood.     Assessment/Plan Continue plan for 3 times a week treatment  Mordecai RasmussenJohn Clapacs, MD 10/04/2015, 10:35 AM

## 2015-10-04 NOTE — Procedures (Signed)
ECT SERVICES Physician's Interval Evaluation & Treatment Note  Patient Identification: Jeffrey Lin MRN:  [id number] Date of Evaluation:  10/04/2015 TX #: 2  MADRS:   MMSE:   P.E. Findings:  No change to physical exam  Psychiatric Interval Note:  Mood continues to be depressed behavior are unchanged  Subjective:  Patient is a 40 y.o. male seen for evaluation for Electroconvulsive Therapy. No specific new complaint mild pain after last treatment  Treatment Summary:   []   Right Unilateral             []  Bilateral   % Energy : 0.3 ms 75%   Impedance: 820 ohms  Seizure Energy Index: 14,658 V squared  Postictal Suppression Index: No reading obtained  Seizure Concordance Index: 98%  Medications  Pre Shock: Toradol 30 mg, Brevital 140 mg, succinylcholine 200 mg  Post Shock:    Seizure Duration: 20 seconds by EMG 21 seconds by EEG. Bifrontal treatment performed   Comments: Bifrontal treatment as previously. Increase energy to 100%   Lungs:  [x]   Clear to auscultation               []  Other:   Heart:    [x]   Regular rhythm             []  irregular rhythm    [x]   Previous H&P reviewed, patient examined and there are NO CHANGES                 []   Previous H&P reviewed, patient examined and there are changes noted.   Jeffrey RasmussenJohn Leeza Heiner, MD 9/13/201710:38 AM

## 2015-10-04 NOTE — Anesthesia Procedure Notes (Signed)
Date/Time: 10/04/2015 10:40 AM Performed by: Henrietta HooverPOPE, Lugene Hitt Pre-anesthesia Checklist: Patient identified, Emergency Drugs available, Suction available, Patient being monitored and Timeout performed Patient Re-evaluated:Patient Re-evaluated prior to inductionOxygen Delivery Method: Circle system utilized Preoxygenation: Pre-oxygenation with 100% oxygen Intubation Type: IV induction Ventilation: Mask ventilation without difficulty Placement Confirmation: positive ETCO2 Dental Injury: Teeth and Oropharynx as per pre-operative assessment

## 2015-10-04 NOTE — Anesthesia Postprocedure Evaluation (Signed)
Anesthesia Post Note  Patient: Jeffrey Lin  Procedure(s) Performed: * No procedures listed *  Patient location during evaluation: PACU Anesthesia Type: General Level of consciousness: awake and alert Pain management: pain level controlled Vital Signs Assessment: post-procedure vital signs reviewed and stable Respiratory status: spontaneous breathing, nonlabored ventilation and respiratory function stable Cardiovascular status: blood pressure returned to baseline and stable Postop Assessment: no signs of nausea or vomiting Anesthetic complications: no    Last Vitals:  Vitals:   10/04/15 1211 10/04/15 1213  BP: (!) 140/96 (!) 143/92  Pulse:    Resp:    Temp:      Last Pain:  Vitals:   10/04/15 0849  TempSrc: Tympanic  PainSc:                  Kerrington Greenhalgh

## 2015-10-04 NOTE — Transfer of Care (Signed)
Immediate Anesthesia Transfer of Care Note  Patient: Jeffrey GillisBobby G Lin  Procedure(s) Performed: * No procedures listed *  Patient Location: PACU  Anesthesia Type:General  Level of Consciousness: awake  Airway & Oxygen Therapy: Patient Spontanous Breathing and Patient connected to face mask oxygen  Post-op Assessment: Report given to RN and Post -op Vital signs reviewed and stable  Post vital signs: Reviewed and stable  Last Vitals:  Vitals:   10/04/15 0845 10/04/15 0849  BP:  (!) 148/103  Pulse: 72 72  Resp: 16   Temp: (!) 35.9 C (!) 35.8 C    Last Pain:  Vitals:   10/04/15 0849  TempSrc: Tympanic  PainSc:          Complications: No apparent anesthesia complications

## 2015-10-05 ENCOUNTER — Other Ambulatory Visit: Payer: Self-pay | Admitting: Psychiatry

## 2015-10-05 ENCOUNTER — Encounter: Payer: Self-pay | Admitting: Podiatry

## 2015-10-05 ENCOUNTER — Ambulatory Visit (INDEPENDENT_AMBULATORY_CARE_PROVIDER_SITE_OTHER): Payer: Managed Care, Other (non HMO) | Admitting: Podiatry

## 2015-10-05 DIAGNOSIS — B351 Tinea unguium: Secondary | ICD-10-CM

## 2015-10-05 NOTE — Progress Notes (Signed)
Subjective: 40 year old male presents the office they discussed nail culture results. States his nails are the same Musa no change since last appointment. His nails are thick and discolored and is causing yellow discoloration. He was on Lamisil over the summer he noticed some improvement while on the medicine but not significant. Denies any pain to the toenails and denies any redness or drainage or any swelling. Denies any systemic complaints such as fevers, chills, nausea, vomiting. No acute changes since last appointment, and no other complaints at this time.   Objective: AAO x3, NAD DP/PT pulses palpable bilaterally, CRT less than 3 seconds Nails continue dystrophic, discolored and most notably the right hallux toe nails hypertrophic. There is no tenderness the tenderness there is no swelling or redness or drainage or any swelling or any other signs of infection. Yellow discoloration to all the nails. No edema, erythema, increase in warmth to bilateral lower extremities.  No open lesions or pre-ulcerative lesions.  No pain with calf compression, swelling, warmth, erythema  Assessment: Onychomycosis  Plan: -All treatment options discussed with the patient including all alternatives, risks, complications.  -Nail culture results were discussed with the patient which should reveal onychomycosis. He just recently finished Lamisil so we will hold off on another round at this point. I ordered a compound cream for topical antifungal to applied to the nails and directed on use as well as duration. Next couple months if he is not having significant improvement discussed possible second course of Lamisil or itraconazole.  -Patient encouraged to call the office with any questions, concerns, change in symptoms.   Ovid CurdMatthew Lin, DPM

## 2015-10-06 ENCOUNTER — Other Ambulatory Visit: Payer: Self-pay | Admitting: Psychiatry

## 2015-10-06 ENCOUNTER — Encounter: Payer: Self-pay | Admitting: Anesthesiology

## 2015-10-06 ENCOUNTER — Other Ambulatory Visit (HOSPITAL_COMMUNITY): Payer: 59

## 2015-10-06 ENCOUNTER — Encounter
Admission: RE | Admit: 2015-10-06 | Discharge: 2015-10-06 | Disposition: A | Discharge status: Home / Self Care | Payer: Managed Care, Other (non HMO) | Source: Ambulatory Visit | Attending: Psychiatry | Admitting: Psychiatry

## 2015-10-06 DIAGNOSIS — Z888 Allergy status to other drugs, medicaments and biological substances status: Secondary | ICD-10-CM | POA: Insufficient documentation

## 2015-10-06 DIAGNOSIS — Z9889 Other specified postprocedural states: Secondary | ICD-10-CM | POA: Diagnosis not present

## 2015-10-06 DIAGNOSIS — F338 Other recurrent depressive disorders: Secondary | ICD-10-CM | POA: Diagnosis present

## 2015-10-06 DIAGNOSIS — Z818 Family history of other mental and behavioral disorders: Secondary | ICD-10-CM | POA: Insufficient documentation

## 2015-10-06 DIAGNOSIS — F332 Major depressive disorder, recurrent severe without psychotic features: Secondary | ICD-10-CM | POA: Diagnosis not present

## 2015-10-06 DIAGNOSIS — F209 Schizophrenia, unspecified: Secondary | ICD-10-CM | POA: Diagnosis not present

## 2015-10-06 DIAGNOSIS — F419 Anxiety disorder, unspecified: Secondary | ICD-10-CM | POA: Insufficient documentation

## 2015-10-06 DIAGNOSIS — F17210 Nicotine dependence, cigarettes, uncomplicated: Secondary | ICD-10-CM | POA: Insufficient documentation

## 2015-10-06 DIAGNOSIS — Z885 Allergy status to narcotic agent status: Secondary | ICD-10-CM | POA: Diagnosis not present

## 2015-10-06 MED ORDER — KETOROLAC TROMETHAMINE 30 MG/ML IJ SOLN
INTRAMUSCULAR | Status: AC
  Administered 2015-10-06: 30 mg via INTRAVENOUS
  Filled 2015-10-06: qty 1

## 2015-10-06 MED ORDER — KETOROLAC TROMETHAMINE 30 MG/ML IJ SOLN
30.0000 mg | Freq: Once | INTRAMUSCULAR | Status: AC
  Administered 2015-10-06: 30 mg via INTRAVENOUS

## 2015-10-06 MED ORDER — SODIUM CHLORIDE 0.9 % IV SOLN
500.0000 mL | Freq: Once | INTRAVENOUS | Status: AC
  Administered 2015-10-06: 500 mL via INTRAVENOUS

## 2015-10-06 MED ORDER — LABETALOL HCL 5 MG/ML IV SOLN
INTRAVENOUS | Status: DC | PRN
  Administered 2015-10-06: 20 mg via INTRAVENOUS

## 2015-10-06 MED ORDER — SODIUM CHLORIDE 0.9 % IV SOLN
INTRAVENOUS | Status: DC | PRN
  Administered 2015-10-06: 11:00:00 via INTRAVENOUS

## 2015-10-06 MED ORDER — SUCCINYLCHOLINE CHLORIDE 20 MG/ML IJ SOLN
INTRAMUSCULAR | Status: DC | PRN
  Administered 2015-10-06: 200 mg via INTRAVENOUS

## 2015-10-06 MED ORDER — METHOHEXITAL SODIUM 100 MG/10ML IV SOSY
PREFILLED_SYRINGE | INTRAVENOUS | Status: DC | PRN
  Administered 2015-10-06: 140 mg via INTRAVENOUS

## 2015-10-06 NOTE — Anesthesia Procedure Notes (Signed)
Date/Time: 10/06/2015 11:00 AM Performed by: Stormy FabianURTIS, Juanita Devincent Pre-anesthesia Checklist: Patient identified, Emergency Drugs available, Suction available and Patient being monitored Patient Re-evaluated:Patient Re-evaluated prior to inductionOxygen Delivery Method: Circle system utilized Preoxygenation: Pre-oxygenation with 100% oxygen Intubation Type: IV induction Ventilation: Mask ventilation without difficulty and Mask ventilation throughout procedure Airway Equipment and Method: Bite block Placement Confirmation: positive ETCO2 Dental Injury: Teeth and Oropharynx as per pre-operative assessment

## 2015-10-06 NOTE — Transfer of Care (Signed)
Immediate Anesthesia Transfer of Care Note  Patient: Jeffrey Lin  Procedure(s) Performed: * No procedures listed *  Patient Location: PACU  Anesthesia Type:General  Level of Consciousness: sedated  Airway & Oxygen Therapy: Patient Spontanous Breathing and Patient connected to face mask oxygen  Post-op Assessment: Report given to RN and Post -op Vital signs reviewed and stable  Post vital signs: Reviewed and stable  Last Vitals:  Vitals:   10/06/15 0843 10/06/15 1118  BP: (!) 149/98 (!) 136/99  Pulse: 86 85  Resp: 18 16  Temp: 36.4 C 36.3 C    Complications: No apparent anesthesia complications

## 2015-10-06 NOTE — Procedures (Signed)
ECT SERVICES Physician's Interval Evaluation & Treatment Note  Patient Identification: Jeffrey Lin MRN:  [id number] Date of Evaluation:  10/06/2015 TX #: 3  MADRS:   MMSE:   P.E. Findings:  No change to physical exam. Vital signs stable. Lungs and heart normal.  Psychiatric Interval Note:  Mood essentially the same as previously.  Subjective:  Patient is a 40 y.o. male seen for evaluation for Electroconvulsive Therapy. A little bit of mood up and down but nothing worse than normal. No other complaints  Treatment Summary:   [x]   Right Unilateral             []  Bilateral   % Energy : 0.3 ms 100%   Impedance: 1340 ohms  Seizure Energy Index: 745 V squared  Postictal Suppression Index: 49%  Seizure Concordance Index: No reading obtained  Medications  Pre Shock: Toradol 30 mg, Brevital 140 mg, succinylcholine 200 mg  Post Shock:    Seizure Duration: The computer Reading 9 seconds EEG seizure but I am dubious about that. It looked like no seizure but at all to me. We will be switching to ketamine for anesthetic   Comments: Switch to ketamine next time. Also this continues to be bifrontal ECT not right unilateral.   Lungs:  [x]   Clear to auscultation               []  Other:   Heart:    [x]   Regular rhythm             []  irregular rhythm    [x]   Previous H&P reviewed, patient examined and there are NO CHANGES                 []   Previous H&P reviewed, patient examined and there are changes noted.   Mordecai RasmussenJohn Khadeejah Castner, MD 9/15/201711:00 AM

## 2015-10-06 NOTE — Anesthesia Postprocedure Evaluation (Signed)
Anesthesia Post Note  Patient: Jeffrey Lin  Procedure(s) Performed: * No procedures listed *  Patient location during evaluation: PACU Anesthesia Type: General Level of consciousness: awake and alert Pain management: pain level controlled Vital Signs Assessment: post-procedure vital signs reviewed and stable Respiratory status: spontaneous breathing, nonlabored ventilation, respiratory function stable and patient connected to nasal cannula oxygen Cardiovascular status: blood pressure returned to baseline and stable Postop Assessment: no signs of nausea or vomiting Anesthetic complications: no    Last Vitals:  Vitals:   10/06/15 1140 10/06/15 1155  BP:  (!) 149/97  Pulse: 79 76  Resp: 20 18  Temp: 36.6 C     Last Pain:  Vitals:   10/06/15 1140  TempSrc: Tympanic  PainSc: 0-No pain                 Lenard SimmerAndrew Lyrika Souders

## 2015-10-06 NOTE — Discharge Instructions (Signed)
1)  The drugs that you have been given will stay in your system until tomorrow so for the       next 24 hours you should not:  A. Drive an automobile  B. Make any legal decisions  C. Drink any alcoholic beverages  2)  You may resume your regular meals upon return home.  3)  A responsible adult must take you home.  Someone should stay with you for a few          hours, then be available by phone for the remainder of the treatment day.  4)  You May experience any of the following symptoms:  Headache, Nausea and a dry mouth (due to the medications you were given),  temporary memory loss and some confusion, or sore muscles (a warm bath  should help this).  If you you experience any of these symptoms let us know on                your return visit.  5)  Report any of the following: any acute discomfort, severe headache, or temperature        greater than 100.5 F.   Also report any unusual redness, swelling, drainage, or pain         at your IV site.    You may report Symptoms to:  [facility] [place] [facility]          Phone: [phone], ECT Department           or Dr. Shary Keylapac's office [phone]  6)  Your next ECT Treatment is Day Monday  Date  September 18  We will call 2 days prior to your scheduled appointment for arrival times.  7)  Nothing to eat or drink after midnight the night before your procedure.  8)  Take Lipitor     With a sip of water the morning of your procedure.  9)  Other Instructions: Call [phone] to cancel the morning of your procedure due         to illness or emergency.  10) We will call within 72 hours to assess how you are feeling.

## 2015-10-06 NOTE — H&P (Signed)
Jeffrey GillisBobby G Lin is an 40 y.o. male.   Chief Complaint: Mood is still up and down a bit. Not terribly extreme. HPI: Recurrent depression possible bipolar disorder and mood instability receiving ECT treatment currently on treatment #3  Past Medical History:  Diagnosis Date  . Anxiety   . Chronic leg pain   . Depression   . Schizoaffective disorder [facility](HCC)     Past Surgical History:  Procedure Laterality Date  . Block procedure at pain center  03/27/06  . Bone graft from hip to jaw  2004  . Bone graft right side of head to jaw  2006  . 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 Sphenopalatine ganglionic      Family History  Problem Relation Age of Onset  . Hyperlipidemia Mother   . Hypertension Mother   . Depression Mother   . Anxiety disorder Mother   . Depression Brother   . Anxiety disorder Brother   . Bipolar disorder Maternal Grandfather   . Schizophrenia Maternal Grandfather   . Bipolar disorder Paternal Grandfather    Social History:  reports that he has been smoking Cigarettes.  He started smoking about 20 years ago. He has a 5.00 pack-year smoking history. He has quit using smokeless tobacco. He reports that he does not drink alcohol or use drugs.  Allergies:  Allergies  Allergen Reactions  . Toradol [Ketorolac Tromethamine] Swelling  . Lamotrigine Rash     (Not in a hospital admission)  No results found for this or any previous visit (from the past 48 hour(s)). No results found.  Review of Systems  Constitutional: Negative.   HENT: Negative.   Eyes: Negative.   Respiratory: Negative.   Cardiovascular: Negative.   Gastrointestinal: Negative.   Musculoskeletal: Negative.   Skin: Negative.   Neurological: Negative.   Psychiatric/Behavioral: Positive for depression. Negative for hallucinations, memory loss, substance abuse and suicidal ideas. The patient is nervous/anxious. The patient does not have insomnia.     Blood pressure (!) 149/98, pulse 86,  temperature 97.6 F (36.4 C), temperature source Oral, resp. rate 18, height 6\' 6"  (1.981 m), weight 110.7 kg (244 lb), SpO2 96 %. Physical Exam  Nursing note and vitals reviewed. Constitutional: He appears well-developed and well-nourished.  HENT:  Head: Normocephalic and atraumatic.  Eyes: Conjunctivae are normal. Pupils are equal, round, and reactive to light.  Neck: Normal range of motion.  Cardiovascular: Regular rhythm and normal heart sounds.   Respiratory: Effort normal. No respiratory distress.  GI: Soft.  Musculoskeletal: Normal range of motion.  Neurological: He is alert.  Skin: Skin is warm and dry.  Psychiatric: Judgment and thought content normal. His speech is delayed. He is slowed. Cognition and memory are normal. He exhibits a depressed mood. He expresses no suicidal ideation.     Assessment/Plan Tolerating treatment well. Treatment today and continue 3 times a week schedule into next week.  Jeffrey RasmussenJohn Clapacs, MD 10/06/2015, 10:58 AM

## 2015-10-06 NOTE — Anesthesia Preprocedure Evaluation (Signed)
Anesthesia Evaluation  Patient identified by MRN, date of birth, ID band Patient awake    Reviewed: Allergy & Precautions, NPO status , Patient's Chart, lab work & pertinent test results  History of Anesthesia Complications Negative for: history of anesthetic complications  Airway Mallampati: III  TM Distance: >3 FB Neck ROM: Full  Mouth opening: Limited Mouth Opening  Dental  (+) Poor Dentition, Implants   Pulmonary neg sleep apnea, neg COPD, Current Smoker,    breath sounds clear to auscultation- rhonchi (-) wheezing      Cardiovascular Exercise Tolerance: Good Hypertension: hx of HTN, not currently on medication. (-) CAD and (-) Past MI  Rhythm:Regular Rate:Normal     Neuro/Psych Anxiety Depression Schizophrenia negative neurological ROS     GI/Hepatic negative GI ROS, Neg liver ROS,   Endo/Other  negative endocrine ROSneg diabetes  Renal/GU negative Renal ROS     Musculoskeletal negative musculoskeletal ROS (+)   Abdominal (+) - obese,   Peds  Hematology negative hematology ROS (+)   Anesthesia Other Findings Past Medical History: No date: Anxiety No date: Chronic leg pain No date: Depression No date: Schizoaffective disorder (HCC)   Reproductive/Obstetrics                             Anesthesia Physical Anesthesia Plan  ASA: II  Anesthesia Plan: General   Post-op Pain Management:    Induction: Intravenous  Airway Management Planned: Mask  Additional Equipment:   Intra-op Plan:   Post-operative Plan:   Informed Consent: I have reviewed the patients History and Physical, chart, labs and discussed the procedure including the risks, benefits and alternatives for the proposed anesthesia with the patient or authorized representative who has indicated his/her understanding and acceptance.   Dental advisory given  Plan Discussed with: CRNA and  Anesthesiologist  Anesthesia Plan Comments:         Anesthesia Quick Evaluation  

## 2015-10-06 NOTE — Anesthesia Procedure Notes (Deleted)
Date/Time: 10/06/2015 11:08 AM Performed by: Stormy FabianURTIS, Cloys Vera Pre-anesthesia Checklist: Patient identified, Emergency Drugs available, Suction available and Patient being monitored Patient Re-evaluated:Patient Re-evaluated prior to inductionOxygen Delivery Method: Circle system utilized Preoxygenation: Pre-oxygenation with 100% oxygen Intubation Type: IV induction Ventilation: Mask ventilation without difficulty and Mask ventilation throughout procedure Airway Equipment and Method: Bite block Placement Confirmation: positive ETCO2 Dental Injury: Teeth and Oropharynx as per pre-operative assessment

## 2015-10-09 ENCOUNTER — Encounter: Payer: Self-pay | Admitting: Certified Registered"

## 2015-10-09 ENCOUNTER — Other Ambulatory Visit (HOSPITAL_COMMUNITY): Payer: 59

## 2015-10-09 ENCOUNTER — Encounter
Admission: RE | Admit: 2015-10-09 | Discharge: 2015-10-09 | Disposition: A | Discharge status: Home / Self Care | Payer: 59 | Source: Ambulatory Visit | Attending: Psychiatry | Admitting: Psychiatry

## 2015-10-09 DIAGNOSIS — Z818 Family history of other mental and behavioral disorders: Secondary | ICD-10-CM | POA: Insufficient documentation

## 2015-10-09 DIAGNOSIS — F17210 Nicotine dependence, cigarettes, uncomplicated: Secondary | ICD-10-CM | POA: Diagnosis not present

## 2015-10-09 DIAGNOSIS — F332 Major depressive disorder, recurrent severe without psychotic features: Secondary | ICD-10-CM

## 2015-10-09 DIAGNOSIS — F419 Anxiety disorder, unspecified: Secondary | ICD-10-CM | POA: Insufficient documentation

## 2015-10-09 DIAGNOSIS — F339 Major depressive disorder, recurrent, unspecified: Secondary | ICD-10-CM | POA: Insufficient documentation

## 2015-10-09 DIAGNOSIS — F209 Schizophrenia, unspecified: Secondary | ICD-10-CM | POA: Diagnosis not present

## 2015-10-09 DIAGNOSIS — Z885 Allergy status to narcotic agent status: Secondary | ICD-10-CM | POA: Insufficient documentation

## 2015-10-09 DIAGNOSIS — R569 Unspecified convulsions: Secondary | ICD-10-CM | POA: Diagnosis not present

## 2015-10-09 DIAGNOSIS — Z9889 Other specified postprocedural states: Secondary | ICD-10-CM | POA: Insufficient documentation

## 2015-10-09 DIAGNOSIS — Z888 Allergy status to other drugs, medicaments and biological substances status: Secondary | ICD-10-CM | POA: Diagnosis not present

## 2015-10-09 MED ORDER — SODIUM CHLORIDE 0.9 % IV SOLN
500.0000 mL | Freq: Once | INTRAVENOUS | Status: AC
  Administered 2015-10-09: 500 mL via INTRAVENOUS

## 2015-10-09 MED ORDER — KETOROLAC TROMETHAMINE 30 MG/ML IJ SOLN
30.0000 mg | Freq: Once | INTRAMUSCULAR | Status: DC
  Administered 2015-10-09: 30 mg via INTRAVENOUS

## 2015-10-09 MED ORDER — MIDAZOLAM HCL 2 MG/2ML IJ SOLN
INTRAMUSCULAR | Status: DC | PRN
  Administered 2015-10-09: 2 mg via INTRAVENOUS

## 2015-10-09 MED ORDER — KETOROLAC TROMETHAMINE 30 MG/ML IJ SOLN
INTRAMUSCULAR | Status: AC
  Administered 2015-10-09: 30 mg via INTRAVENOUS
  Filled 2015-10-09: qty 1

## 2015-10-09 MED ORDER — SUCCINYLCHOLINE CHLORIDE 20 MG/ML IJ SOLN
INTRAMUSCULAR | Status: DC | PRN
  Administered 2015-10-09: 200 mg via INTRAVENOUS

## 2015-10-09 MED ORDER — SODIUM CHLORIDE 0.9 % IV SOLN
INTRAVENOUS | Status: DC | PRN
Start: 2015-10-09 — End: 2015-10-09
  Administered 2015-10-09: 11:00:00 via INTRAVENOUS

## 2015-10-09 MED ORDER — KETAMINE HCL 10 MG/ML IJ SOLN
INTRAMUSCULAR | Status: DC | PRN
  Administered 2015-10-09: 140 mg via INTRAVENOUS

## 2015-10-09 NOTE — Procedures (Signed)
ECT SERVICES Physician's Interval Evaluation & Treatment Note  Patient Identification: Jeffrey Lin MRN:  [id number] Date of Evaluation:  10/09/2015 TX #: 4  MADRS:   MMSE:   P.E. Findings:  No new physical findings. Vital signs are stable. Mood is feeling a little bit better. Lungs and heart normal.  Psychiatric Interval Note:  Subjectively feeling better. Still has a somewhat blunted affect that appears to be baseline.  Subjective:  Patient is a 40 y.o. male seen for evaluation for Electroconvulsive Therapy. No new complaints.  Treatment Summary:   []   Right Unilateral             []  Bilateral   % Energy : 0.3 ms, 100%   Impedance: 1010 ohms  Seizure Energy Index: 545 V squared  Postictal Suppression Index: No reading obtained  Seizure Concordance Index: 60%  Medications  Pre Shock: Ketamine 140 mg succinylcholine 200 mg Toradol 30 mg  Post Shock:    Seizure Duration: 5 seconds by EMG 23 seconds by EEG   Comments: Had an adequate seizure if low amplitude. Follow-up continued treatment. Note again that this is bifrontal treatment not right unilateral.   Lungs:  [x]   Clear to auscultation               []  Other:   Heart:    [x]   Regular rhythm             []  irregular rhythm    [x]   Previous H&P reviewed, patient examined and there are NO CHANGES                 []   Previous H&P reviewed, patient examined and there are changes noted.   Mordecai RasmussenJohn Jeury Mcnab, MD 9/18/201712:04 PM

## 2015-10-09 NOTE — Anesthesia Preprocedure Evaluation (Signed)
Anesthesia Evaluation  Patient identified by MRN, date of birth, ID band Patient awake    Reviewed: Allergy & Precautions, NPO status , Patient's Chart, lab work & pertinent test results  History of Anesthesia Complications Negative for: history of anesthetic complications  Airway Mallampati: III  TM Distance: >3 FB Neck ROM: Full  Mouth opening: Limited Mouth Opening  Dental  (+) Poor Dentition, Implants   Pulmonary neg sleep apnea, neg COPD, Current Smoker,    breath sounds clear to auscultation- rhonchi (-) wheezing      Cardiovascular Exercise Tolerance: Good Hypertension: hx of HTN, not currently on medication. (-) CAD and (-) Past MI  Rhythm:Regular Rate:Normal     Neuro/Psych negative neurological ROS     GI/Hepatic negative GI ROS, Neg liver ROS,   Endo/Other  negative endocrine ROSneg diabetes  Renal/GU negative Renal ROS     Musculoskeletal negative musculoskeletal ROS (+)   Abdominal (+) - obese,   Peds  Hematology negative hematology ROS (+)   Anesthesia Other Findings Past Medical History: No date: Anxiety No date: Chronic leg pain No date: Depression No date: Schizoaffective disorder [facility](HCC)   Reproductive/Obstetrics                             Anesthesia Physical  Anesthesia Plan  ASA: II  Anesthesia Plan: General   Post-op Pain Management:    Induction: Intravenous  Airway Management Planned: Mask  Additional Equipment:   Intra-op Plan:   Post-operative Plan:   Informed Consent: I have reviewed the patients History and Physical, chart, labs and discussed the procedure including the risks, benefits and alternatives for the proposed anesthesia with the patient or authorized representative who has indicated his/her understanding and acceptance.   Dental advisory given  Plan Discussed with: CRNA and Anesthesiologist  Anesthesia Plan Comments:          Anesthesia Quick Evaluation

## 2015-10-09 NOTE — Transfer of Care (Signed)
Immediate Anesthesia Transfer of Care Note  Patient: Jeffrey Lin  Procedure(s) Performed: * No procedures listed *  Patient Location: PACU  Anesthesia Type:General  Level of Consciousness: sedated  Airway & Oxygen Therapy: Patient Spontanous Breathing and Patient connected to face mask oxygen  Post-op Assessment: Report given to RN and Post -op Vital signs reviewed and stable  Post vital signs: Reviewed and stable  Last Vitals:  Vitals:   10/09/15 0913 10/09/15 1136  BP: (!) 152/111 (!) 175/119  Pulse: 74 71  Resp: 18 12  Temp: 36.4 C     Last Pain:  Vitals:   10/09/15 0915  TempSrc:   PainSc: 0-No pain      Patients Stated Pain Goal: 0 (10/09/15 0915)  Complications: No apparent anesthesia complications

## 2015-10-09 NOTE — H&P (Signed)
Jeffrey Lin is an 40 y.o. male.   Chief Complaint: Patient with recurrent severe depression. Now on treatment #4. He is feeling subjectively better. Minimal memory problems. No new physical complaints. 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 Anxiety   . Chronic leg pain   . Depression   . Schizoaffective disorder [facility](HCC)     Past Surgical History:  Procedure Laterality Date  . Block procedure at pain center  03/27/06  . Bone graft from hip to jaw  2004  . Bone graft right side of head to jaw  2006  . 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 Sphenopalatine ganglionic      Family History  Problem Relation Age of Onset  . Hyperlipidemia Mother   . Hypertension Mother   . Depression Mother   . Anxiety disorder Mother   . Depression Brother   . Anxiety disorder Brother   . Bipolar disorder Maternal Grandfather   . Schizophrenia Maternal Grandfather   . Bipolar disorder Paternal Grandfather    Social History:  reports that he has been smoking Cigarettes.  He started smoking about 20 years ago. He has a 5.00 pack-year smoking history. He has quit using smokeless tobacco. He reports that he does not drink alcohol or use drugs.  Allergies:  Allergies  Allergen Reactions  . Toradol [Ketorolac Tromethamine] Swelling  . Lamotrigine Rash     (Not in a hospital admission)  No results found for this or any previous visit (from the past 48 hour(s)). No results found.  Review of Systems  Constitutional: Negative.   HENT: Negative.   Eyes: Negative.   Respiratory: Negative.   Cardiovascular: Negative.   Gastrointestinal: Negative.   Musculoskeletal: Negative.   Skin: Negative.   Neurological: Negative.   Psychiatric/Behavioral: Negative for depression, hallucinations, memory loss, substance abuse and suicidal ideas. The patient is not  nervous/anxious and does not have insomnia.     Blood pressure (!) 151/107, pulse 69, temperature 97 F (36.1 C), resp. rate 12, height 6\' 6"  (1.981 m), weight 111.6 kg (246 lb), SpO2 100 %. Physical Exam   Assessment/Plan Treatment today and continue Wednesday and Friday 3 times a week schedule with ongoing monitoring of response. Patient will be receiving ketamine today because of inadequate seizure last time.  Jeffrey RasmussenJohn Nilaya Bouie, MD 10/09/2015, 12:02 PM

## 2015-10-09 NOTE — Discharge Instructions (Signed)
1)  The drugs that you have been given will stay in your system until tomorrow so for the       next 24 hours you should not: ° A. Drive an automobile ° B. Make any legal decisions ° C. Drink any alcoholic beverages ° °2)  You may resume your regular meals upon return home. ° °3)  A responsible adult must take you home.  Someone should stay with you for a few          hours, then be available by phone for the remainder of the treatment day. ° °4)  You May experience any of the following symptoms: ° Headache, Nausea and a dry mouth (due to the medications you were given),  temporary memory loss and some confusion, or sore muscles (a warm bath  should help this).  If you you experience any of these symptoms let us know on                your return visit. ° °5)  Report any of the following: any acute discomfort, severe headache, or temperature        greater than 100.5 F.   Also report any unusual redness, swelling, drainage, or pain         at your IV site. ° °  You may report Symptoms to:  ECT PROGRAM- [place] [facility] °         Phone: [phone], ECT Department  °         or Dr. Clapac's office [phone] ° °6)  Your next ECT Treatment is Day Wednesday  Date October 11, 2015 ° We will call 2 days prior to your scheduled appointment for arrival times. ° °7)  Nothing to eat or drink after midnight the night before your procedure. ° °8)  Take nothing    With a sip of water the morning of your procedure. ° °9)  Other Instructions: Call [phone] to cancel the morning of your procedure due         to illness or emergency. ° °10) We will call within 72 hours to assess how you are feeling.  °

## 2015-10-09 NOTE — Anesthesia Procedure Notes (Signed)
Date/Time: 10/09/2015 11:25 AM Performed by: Casey BurkittHOANG, Madisynn Plair Pre-anesthesia Checklist: Patient identified, Emergency Drugs available, Suction available and Patient being monitored Patient Re-evaluated:Patient Re-evaluated prior to inductionOxygen Delivery Method: Circle system utilized Preoxygenation: Pre-oxygenation with 100% oxygen Intubation Type: IV induction Ventilation: Mask ventilation without difficulty and Mask ventilation throughout procedure Airway Equipment and Method: Bite block Placement Confirmation: positive ETCO2 Dental Injury: Teeth and Oropharynx as per pre-operative assessment

## 2015-10-10 ENCOUNTER — Other Ambulatory Visit: Payer: Self-pay | Admitting: Psychiatry

## 2015-10-10 ENCOUNTER — Encounter: Payer: Self-pay | Admitting: Internal Medicine

## 2015-10-10 ENCOUNTER — Other Ambulatory Visit (HOSPITAL_COMMUNITY): Payer: 59

## 2015-10-10 ENCOUNTER — Ambulatory Visit (INDEPENDENT_AMBULATORY_CARE_PROVIDER_SITE_OTHER): Payer: Managed Care, Other (non HMO) | Admitting: Internal Medicine

## 2015-10-10 VITALS — BP 140/98 | HR 69 | Temp 97.6°F | Wt 247.5 lb

## 2015-10-10 DIAGNOSIS — I10 Essential (primary) hypertension: Secondary | ICD-10-CM

## 2015-10-10 MED ORDER — LISINOPRIL 10 MG PO TABS: 10.0000 mg | ORAL_TABLET | Freq: Every day | ORAL | 1 refills | Status: DC

## 2015-10-10 NOTE — Patient Instructions (Signed)
Hypertension Hypertension, commonly called high blood pressure, is when the force of blood pumping through your arteries is too strong. Your arteries are the blood vessels that carry blood from your heart throughout your body. A blood pressure reading consists of a higher number over a lower number, such as 110/72. The higher number (systolic) is the pressure inside your arteries when your heart pumps. The lower number (diastolic) is the pressure inside your arteries when your heart relaxes. Ideally you want your blood pressure below 120/80. Hypertension forces your heart to work harder to pump blood. Your arteries may become narrow or stiff. Having untreated or uncontrolled hypertension can cause heart attack, stroke, kidney disease, and other problems. RISK FACTORS Some risk factors for high blood pressure are controllable. Others are not.  Risk factors you cannot control include:   Race. You may be at higher risk if you are African American.  Age. Risk increases with age.  Gender. Men are at higher risk than women before age 45 years. After age 65, women are at higher risk than men. Risk factors you can control include:  Not getting enough exercise or physical activity.  Being overweight.  Getting too much fat, sugar, calories, or salt in your diet.  Drinking too much alcohol. SIGNS AND SYMPTOMS Hypertension does not usually cause signs or symptoms. Extremely high blood pressure (hypertensive crisis) may cause headache, anxiety, shortness of breath, and nosebleed. DIAGNOSIS To check if you have hypertension, your health care provider will measure your blood pressure while you are seated, with your arm held at the level of your heart. It should be measured at least twice using the same arm. Certain conditions can cause a difference in blood pressure between your right and left arms. A blood pressure reading that is higher than normal on one occasion does not mean that you need treatment. If  it is not clear whether you have high blood pressure, you may be asked to return on a different day to have your blood pressure checked again. Or, you may be asked to monitor your blood pressure at home for 1 or more weeks. TREATMENT Treating high blood pressure includes making lifestyle changes and possibly taking medicine. Living a healthy lifestyle can help lower high blood pressure. You may need to change some of your habits. Lifestyle changes may include:  Following the DASH diet. This diet is high in fruits, vegetables, and whole grains. It is low in salt, red meat, and added sugars.  Keep your sodium intake below 2,300 mg per day.  Getting at least 30-45 minutes of aerobic exercise at least 4 times per week.  Losing weight if necessary.  Not smoking.  Limiting alcoholic beverages.  Learning ways to reduce stress. Your health care provider may prescribe medicine if lifestyle changes are not enough to get your blood pressure under control, and if one of the following is true:  You are 18-59 years of age and your systolic blood pressure is above 140.  You are 60 years of age or older, and your systolic blood pressure is above 150.  Your diastolic blood pressure is above 90.  You have diabetes, and your systolic blood pressure is over 140 or your diastolic blood pressure is over 90.  You have kidney disease and your blood pressure is above 140/90.  You have heart disease and your blood pressure is above 140/90. Your personal target blood pressure may vary depending on your medical conditions, your age, and other factors. HOME CARE INSTRUCTIONS    Have your blood pressure rechecked as directed by your health care provider.   Take medicines only as directed by your health care provider. Follow the directions carefully. Blood pressure medicines must be taken as prescribed. The medicine does not work as well when you skip doses. Skipping doses also puts you at risk for  problems.  Do not smoke.   Monitor your blood pressure at home as directed by your health care provider. SEEK MEDICAL CARE IF:   You think you are having a reaction to medicines taken.  You have recurrent headaches or feel dizzy.  You have swelling in your ankles.  You have trouble with your vision. SEEK IMMEDIATE MEDICAL CARE IF:  You develop a severe headache or confusion.  You have unusual weakness, numbness, or feel faint.  You have severe chest or abdominal pain.  You vomit repeatedly.  You have trouble breathing. MAKE SURE YOU:   Understand these instructions.  Will watch your condition.  Will get help right away if you are not doing well or get worse.   This information is not intended to replace advice given to you by your health care provider. Make sure you discuss any questions you have with your health care provider.   Document Released: 01/07/2005 Document Revised: 05/24/2014 Document Reviewed: 10/30/2012 Elsevier Interactive Patient Education 2016 Elsevier Inc.  

## 2015-10-10 NOTE — Anesthesia Postprocedure Evaluation (Signed)
Anesthesia Post Note  Patient: Jeffrey Lin  Procedure(s) Performed: * No procedures listed *  Patient location during evaluation: PACU Anesthesia Type: General Level of consciousness: awake and alert Pain management: pain level controlled Vital Signs Assessment: post-procedure vital signs reviewed and stable Respiratory status: spontaneous breathing, nonlabored ventilation, respiratory function stable and patient connected to nasal cannula oxygen Cardiovascular status: blood pressure returned to baseline and stable Postop Assessment: no signs of nausea or vomiting Anesthetic complications: no    Last Vitals:  Vitals:   10/09/15 1215 10/09/15 1242  BP: (!) 162/109 (!) 135/93  Pulse: 68   Resp: 18   Temp:      Last Pain:  Vitals:   10/09/15 1215  TempSrc:   PainSc: 0-No pain                 Lenard SimmerAndrew Camara Renstrom

## 2015-10-10 NOTE — Progress Notes (Signed)
Subjective:    Patient ID: Jeffrey Lin, male    DOB: May 05, 1975, 40 y.o.   MRN: [id number]  HPI  Pt presents to the clinic today with c/o elevated blood pressure. He noticed this within the last week. He reports he is getting ECT treatments 3 x week and his BP is always elevated before and after the treatment. His BP has been as high as 175/119 and as low as 135/93. His BP today is 140/98. He denies dizziness, blurred vision, chest pain or shortness of breath. He has never had high BP in the past. He denies being anxious or stressed. He reports the ECT treatments are not painful.  Review of Systems      Past Medical History:  Diagnosis Date  . Anxiety   . Chronic leg pain   . Depression   . Schizoaffective disorder (HCC)     Current Outpatient Prescriptions  Medication Sig Dispense Refill  . amantadine (SYMMETREL) 100 MG capsule Take 100 mg by mouth 2 (two) times daily.  5  . atorvastatin (LIPITOR) 40 MG tablet Take 1 tablet (40 mg total) by mouth daily. 90 tablet 3  . benztropine (COGENTIN) 1 MG tablet     . clonazePAM (KLONOPIN) 1 MG tablet Take 1 mg by mouth 2 (two) times daily.   3  . diazepam (VALIUM) 10 MG tablet     . diclofenac (VOLTAREN) 75 MG EC tablet TAKE 1 TABLET (75 MG TOTAL) BY MOUTH 2 (TWO) TIMES DAILY. WITH FOOD. 60 tablet 2  . gabapentin (NEURONTIN) 600 MG tablet Take 1,200-1,800 mg by mouth See admin instructions. Take either two tablets (1200mg ) three times a day or three tablets (1800mg ) two times a day.    . hydrOXYzine (VISTARIL) 25 MG capsule TAKE 1 CAPSULE BY MOUTH 4 TIMES A DAY  5  . methocarbamol (ROBAXIN) 500 MG tablet Take 1 tablet (500 mg total) by mouth every 8 (eight) hours as needed for muscle spasms. 90 tablet 0  . OLANZapine-FLUoxetine (SYMBYAX) 6-25 MG capsule     . propranolol (INDERAL) 20 MG tablet Take 20 mg by mouth 3 (three) times daily.  2  . triamcinolone cream (KENALOG) 0.1 % Apply 1 application topically 2 (two) times daily. 30 g 0    . Vitamin D, Ergocalciferol, (DRISDOL) 50000 units CAPS capsule Take 1 capsule (50,000 Units total) by mouth every 7 (seven) days. (Patient taking differently: Take 50,000 Units by mouth 2 (two) times a week. ) 12 capsule 0  . lisinopril (PRINIVIL,ZESTRIL) 10 MG tablet Take 1 tablet (10 mg total) by mouth daily. 30 tablet 1   No current facility-administered medications for this visit.     Allergies  Allergen Reactions  . Toradol [Ketorolac Tromethamine] Swelling  . Lamotrigine Rash    Family History  Problem Relation Age of Onset  . Hyperlipidemia Mother   . Hypertension Mother   . Depression Mother   . Anxiety disorder Mother   . Depression Brother   . Anxiety disorder Brother   . Bipolar disorder Maternal Grandfather   . Schizophrenia Maternal Grandfather   . Bipolar disorder Paternal Grandfather     Social History   Social History  . Marital status: Single    Spouse name: N/A  . Number of children: 0  . Years of education: N/A   Occupational History  . [employer]   Social History Main Topics  . Smoking status: Current Every Day Smoker  Packs/day: 0.50    Years: 10.00    Types: Cigarettes    Start date: 09/22/1995  . Smokeless tobacco: Former Neurosurgeon[employer]  . Alcohol use No  . Drug use: No  . Sexual activity: Not Currently   Other Topics Concern  . Not on file   Social History Narrative   In relationship, + sex, + condom   Exercise:  None, running causes head pain   Diet:  FF once daily, + fruits and veggies, + H2O, + Soda     Constitutional: Denies fever, malaise, fatigue, headache or abrupt weight changes.  HEENT: Denies eye pain, eye redness, ear pain, ringing in the ears, wax buildup, runny nose, nasal congestion, bloody nose, or sore throat. Respiratory: Denies difficulty breathing, shortness of breath, cough or sputum production.   Cardiovascular: Denies chest pain, chest tightness, palpitations or swelling in the  hands or feet.  Neurological: Denies dizziness, difficulty with memory, difficulty with speech or problems with balance and coordination.    No other specific complaints in a complete review of systems (except as listed in HPI above).  Objective:   Physical Exam   BP (!) 140/98   Pulse 69   Temp 97.6 F (36.4 C) (Oral)   Wt 247 lb 8 oz (112.3 kg)   SpO2 96%   BMI 28.60 kg/m  Wt Readings from Last 3 Encounters:  10/10/15 247 lb 8 oz (112.3 kg)  09/29/15 241 lb (109.3 kg)  09/22/15 241 lb (109.3 kg)    General: Appears her stated age, well developed, well nourished in NAD. Cardiovascular: Normal rate and rhythm. S1,S2 noted.  No murmur, rubs or gallops noted. No JVD or BLE edema.  Pulmonary/Chest: Normal effort and positive vesicular breath sounds. No respiratory distress. No wheezes, rales or ronchi noted.  Neurological: Alert and oriented.    BMET    Component Value Date/Time   NA 140 09/29/2015 0841   NA 145 09/11/2013 0406   K 3.7 09/29/2015 0841   K 3.6 09/11/2013 0406   CL 103 09/29/2015 0841   CL 110 (H) 09/11/2013 0406   CO2 28 09/29/2015 0841   CO2 26 09/11/2013 0406   GLUCOSE 86 09/29/2015 0841   GLUCOSE 81 09/11/2013 0406   BUN 10 09/29/2015 0841   BUN 19 (H) 09/11/2013 0406   CREATININE 1.05 09/29/2015 0841   CREATININE 1.05 09/11/2013 0406   CALCIUM 9.2 09/29/2015 0841   CALCIUM 8.0 (L) 09/11/2013 0406   GFRNONAA >60 09/29/2015 0841   GFRNONAA >60 09/11/2013 0406   GFRAA >60 09/29/2015 0841   GFRAA >60 09/11/2013 0406    Lipid Panel     Component Value Date/Time   CHOL 142 05/10/2015 0731   TRIG 247.0 (H) 05/10/2015 0731   HDL 25.60 (L) 05/10/2015 0731   CHOLHDL 6 05/10/2015 0731   VLDL 49.4 (H) 05/10/2015 0731   LDLCALC 102 (H) 09/28/2010 1005    CBC    Component Value Date/Time   WBC 11.1 (H) 09/29/2015 0841   RBC 4.78 09/29/2015 0841   HGB 15.9 09/29/2015 0841   HGB 13.1 09/11/2013 0406   HCT 45.4 09/29/2015 0841   HCT 39.2 (L)  09/11/2013 0406   PLT 244 09/29/2015 0841   PLT 171 09/11/2013 0406   MCV 94.9 09/29/2015 0841   MCV 92 09/11/2013 0406   MCH 33.3 09/29/2015 0841   MCHC 35.1 09/29/2015 0841   RDW 14.8 (H) 09/29/2015 0841   RDW 12.8 09/11/2013 0406   LYMPHSABS 3.4 07/14/2014  1300   LYMPHSABS 3.3 09/11/2013 0406   MONOABS 0.3 07/14/2014 1300   MONOABS 0.4 09/11/2013 0406   EOSABS 0.2 07/14/2014 1300   EOSABS 0.1 09/11/2013 0406   BASOSABS 0.0 07/14/2014 1300   BASOSABS 0.0 09/11/2013 0406    Hgb A1C No results found for: HGBA1C      Assessment & Plan:   HTN:  He has never had HTN in the past, but strong family history His mom reports that they will not allow him to continue with ECT if BP remains elevated Will start Lisinopril 10 mg daily  RTC in 2 weeks for BP check Lyndsay Talamante, NP

## 2015-10-11 ENCOUNTER — Encounter: Payer: Self-pay | Admitting: Anesthesiology

## 2015-10-11 ENCOUNTER — Encounter (HOSPITAL_BASED_OUTPATIENT_CLINIC_OR_DEPARTMENT_OTHER)
Admission: RE | Admit: 2015-10-11 | Discharge: 2015-10-11 | Disposition: A | Discharge status: Home / Self Care | Payer: Managed Care, Other (non HMO) | Source: Ambulatory Visit | Attending: Psychiatry | Admitting: Psychiatry

## 2015-10-11 ENCOUNTER — Other Ambulatory Visit (HOSPITAL_COMMUNITY): Payer: 59

## 2015-10-11 DIAGNOSIS — F339 Major depressive disorder, recurrent, unspecified: Secondary | ICD-10-CM | POA: Diagnosis not present

## 2015-10-11 DIAGNOSIS — F332 Major depressive disorder, recurrent severe without psychotic features: Secondary | ICD-10-CM

## 2015-10-11 MED ORDER — SUCCINYLCHOLINE CHLORIDE 200 MG/10ML IV SOSY
PREFILLED_SYRINGE | INTRAVENOUS | Status: DC | PRN
  Administered 2015-10-11: 200 mg via INTRAVENOUS

## 2015-10-11 MED ORDER — KETAMINE HCL 10 MG/ML IJ SOLN
INTRAMUSCULAR | Status: DC | PRN
  Administered 2015-10-11: 140 mg via INTRAVENOUS

## 2015-10-11 MED ORDER — SODIUM CHLORIDE 0.9 % IV SOLN
INTRAVENOUS | Status: DC | PRN
  Administered 2015-10-11: 11:00:00 via INTRAVENOUS

## 2015-10-11 MED ORDER — KETOROLAC TROMETHAMINE 30 MG/ML IJ SOLN
INTRAMUSCULAR | Status: AC
  Administered 2015-10-11: 30 mg via INTRAVENOUS
  Filled 2015-10-11: qty 1

## 2015-10-11 MED ORDER — SODIUM CHLORIDE 0.9 % IV SOLN
500.0000 mL | Freq: Once | INTRAVENOUS | Status: AC
  Administered 2015-10-11: 500 mL via INTRAVENOUS

## 2015-10-11 MED ORDER — KETOROLAC TROMETHAMINE 30 MG/ML IJ SOLN
30.0000 mg | Freq: Once | INTRAMUSCULAR | Status: AC
  Administered 2015-10-11: 30 mg via INTRAVENOUS

## 2015-10-11 MED ORDER — HYDRALAZINE HCL 20 MG/ML IJ SOLN
10.0000 mg | Freq: Once | INTRAMUSCULAR | Status: AC
  Administered 2015-10-11: 10 mg via INTRAVENOUS

## 2015-10-11 MED ORDER — LABETALOL HCL 5 MG/ML IV SOLN
INTRAVENOUS | Status: DC | PRN
  Administered 2015-10-11: 5 mg via INTRAVENOUS
  Administered 2015-10-11: 15 mg via INTRAVENOUS

## 2015-10-11 MED ORDER — MIDAZOLAM HCL 2 MG/2ML IJ SOLN
INTRAMUSCULAR | Status: DC | PRN
Start: 2015-10-11 — End: 2015-10-11
  Administered 2015-10-11: 2 mg via INTRAVENOUS

## 2015-10-11 NOTE — Transfer of Care (Signed)
Immediate Anesthesia Transfer of Care Note  Patient: Jeffrey Lin  Procedure(s) Performed: ECT  Patient Location: PACU  Anesthesia Type:General  Level of Consciousness: sedated  Airway & Oxygen Therapy: Patient Spontanous Breathing and Patient connected to face mask oxygen  Post-op Assessment: Report given to RN and Post -op Vital signs reviewed and stable  Post vital signs: Reviewed and stable  Last Vitals:  Vitals:   10/11/15 0853 10/11/15 1151  BP: (!) 165/100 (!) 168/117  Pulse: 72 77  Resp: 16 18  Temp: 36.8 C 36.4 C    Last Pain:  Vitals:   10/11/15 0859  TempSrc:   PainSc: 0-No pain      Patients Stated Pain Goal: 0 (10/11/15 0859)  Complications: No apparent anesthesia complications

## 2015-10-11 NOTE — Anesthesia Procedure Notes (Signed)
Date/Time: 10/11/2015 11:37 AM Performed by: Jeffrey KocherPERALTA, Jeffrey Sakurai Pre-anesthesia Checklist: Patient identified, Emergency Drugs available, Suction available and Patient being monitored Patient Re-evaluated:Patient Re-evaluated prior to inductionOxygen Delivery Method: Circle system utilized Preoxygenation: Pre-oxygenation with 100% oxygen Intubation Type: IV induction Ventilation: Mask ventilation without difficulty and Mask ventilation throughout procedure Airway Equipment and Method: Bite block Placement Confirmation: positive ETCO2 Dental Injury: Teeth and Oropharynx as per pre-operative assessment

## 2015-10-11 NOTE — Discharge Instructions (Signed)
1)  The drugs that you have been given will stay in your system until tomorrow so for the       next 24 hours you should not:  A. Drive an automobile  B. Make any legal decisions  C. Drink any alcoholic beverages  2)  You may resume your regular meals upon return home.  3)  A responsible adult must take you home.  Someone should stay with you for a few          hours, then be available by phone for the remainder of the treatment day.  4)  You May experience any of the following symptoms:  Headache, Nausea and a dry mouth (due to the medications you were given),  temporary memory loss and some confusion, or sore muscles (a warm bath  should help this).  If you you experience any of these symptoms let us know on                your return visit.  5)  Report any of the following: any acute discomfort, severe headache, or temperature        greater than 100.5 F.   Also report any unusual redness, swelling, drainage, or pain         at your IV site.    You may report Symptoms to:  ECT PROGRAM- [place] [facility]          Phone: [phone], ECT Department           or Dr. Shary Keylapac's office [phone]  6)  Your next ECT Treatment is Day Friday  Date October 13, 2015 We will call 2 days prior to your scheduled appointment for arrival times.  7)  Nothing to eat or drink after midnight the night before your procedure.  8)  Take Lisinopril and Lipitor    With a sip of water the morning of your procedure.  9)  Other Instructions: Call [phone] to cancel the morning of your procedure due         to illness or emergency.  10) We will call within 72 hours to assess how you are feeling.

## 2015-10-11 NOTE — Anesthesia Postprocedure Evaluation (Signed)
Anesthesia Post Note  Patient: Jeffrey GillisBobby G Lin  Procedure(s) Performed: * No procedures listed *  Patient location during evaluation: PACU Anesthesia Type: General Level of consciousness: awake and alert Pain management: pain level controlled Vital Signs Assessment: post-procedure vital signs reviewed and stable Respiratory status: spontaneous breathing and respiratory function stable Cardiovascular status: stable Anesthetic complications: no    Last Vitals:  Vitals:   10/11/15 1242 10/11/15 1245  BP:  (!) 153/103  Pulse: 73   Resp: 20   Temp:      Last Pain:  Vitals:   10/11/15 1231  TempSrc:   PainSc: 0-No pain                 [name]

## 2015-10-11 NOTE — H&P (Signed)
[name] is an 40 y.o. male.   Chief Complaint: Mood is feeling better. No specific chief complaint.  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 Receiving ECT. Today his treatment #5.  Past Medical History:  Diagnosis Date  . Anxiety   . Chronic leg pain   . Depression   . Schizoaffective disorder [facility]HCC)     Past Surgical History:  Procedure Laterality Date  . Block procedure at pain center  03/27/06  . Bone graft from hip to jaw  2004  . Bone graft right side of head to jaw  2006  . 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 Sphenopalatine ganglionic      Family History  Problem Relation Age of Onset  . Hyperlipidemia Mother   . Hypertension Mother   . Depression Mother   . Anxiety disorder Mother   . Depression Brother   . Anxiety disorder Brother   . Bipolar disorder Maternal Grandfather   . Schizophrenia Maternal Grandfather   . Bipolar disorder Paternal Grandfather    Social History:  reports that he has been smoking Cigarettes.  He started smoking about 20 years ago. He has a 5.00 pack-year smoking history. He has quit using smokeless tobacco. He reports that he does not drink alcohol or use drugs.  Allergies:  Allergies  Allergen Reactions  . Toradol [Ketorolac Tromethamine] Swelling  . Lamotrigine Rash     (Not in a hospital admission)  No results found for this or any previous visit (from the past 48 hour(s)). No results found.  Review of Systems  Constitutional: Negative.   HENT: Negative.   Eyes: Negative.   Respiratory: Negative.   Cardiovascular: Negative.   Gastrointestinal: Negative.   Musculoskeletal: Negative.   Skin: Negative.   Neurological: Negative.   Psychiatric/Behavioral: Negative for depression, hallucinations, memory loss, substance abuse and suicidal ideas. The patient is not nervous/anxious and does not have insomnia.     Blood pressure (!) 165/100, pulse 72, temperature 98.2 F (36.8 C), temperature  source Oral, resp. rate 16, height 6\' 6"  (1.981 m), weight 112 kg (247 lb), SpO2 97 %. Physical Exam  Nursing note and vitals reviewed. Constitutional: He appears well-developed and well-nourished.  HENT:  Head: Normocephalic and atraumatic.  Eyes: Conjunctivae are normal. Pupils are equal, round, and reactive to light.  Neck: Normal range of motion.  Cardiovascular: Regular rhythm and normal heart sounds.   Respiratory: Effort normal. No respiratory distress.  GI: Soft.  Musculoskeletal: Normal range of motion.  Neurological: He is alert.  Skin: Skin is warm and dry.  Psychiatric: His speech is normal and behavior is normal. Judgment and thought content normal. His affect is blunt. Cognition and memory are normal.     Assessment/Plan Treatment again today with ketamine and aesthetic bifrontal treatment. Follow-up Friday  Mordecai RasmussenJohn Clapacs, MD 10/11/2015, 11:27 AM

## 2015-10-11 NOTE — Procedures (Signed)
ECT SERVICES Physician's Interval Evaluation & Treatment Note  Patient Identification: Jeffrey Lin MRN:  [id number] Date of Evaluation:  10/11/2015 TX #: 5  MADRS:   MMSE:   P.E. Findings:  Blood pressure still is up today. Blood pressure up but rest of the physical exam unremarkable.  Psychiatric Interval Note:  Follow-up on Friday. Mood is continued to report to be good  Subjective:  Patient is a 40 y.o. male seen for evaluation for Electroconvulsive Therapy. No chief complaint  Treatment Summary:   []   Right Unilateral             []  Bilateral   % Energy : 0.3 ms 100%  Impedance: 530 ohms  Seizure Energy Index: 2093 V squared  Postictal Suppression Index: 88%  Seizure Concordance Index: 88%  Medications  Pre Shock: Toradol 30 mg ketamine 140 mg succinylcholine 200 mg  Post Shock:    Seizure Duration: 29 6 seconds by EMG, 29 seconds by EEG  Comments: Follow-up Friday   Lungs:  [x]   Clear to auscultation               []  Other:   Heart:    [x]   Regular rhythm             []  irregular rhythm    [x]   Previous H&P reviewed, patient examined and there are NO CHANGES                 []   Previous H&P reviewed, patient examined and there are changes noted.   Mordecai RasmussenJohn Amellia Panik, MD 9/20/201711:29 AM

## 2015-10-11 NOTE — Anesthesia Preprocedure Evaluation (Signed)
Anesthesia Evaluation  Patient identified by MRN, date of birth, ID band Patient awake    Reviewed: Allergy & Precautions, NPO status , Patient's Chart, lab work & pertinent test results  History of Anesthesia Complications Negative for: history of anesthetic complications  Airway Mallampati: III  TM Distance: >3 FB Neck ROM: Full  Mouth opening: Limited Mouth Opening  Dental  (+) Poor Dentition, Implants   Pulmonary neg sleep apnea, neg COPD, Current Smoker,    breath sounds clear to auscultation- rhonchi (-) wheezing      Cardiovascular Exercise Tolerance: Good Hypertension: hx of HTN, not currently on medication. (-) CAD and (-) Past MI  Rhythm:Regular Rate:Normal     Neuro/Psych negative neurological ROS     GI/Hepatic negative GI ROS, Neg liver ROS,   Endo/Other  negative endocrine ROSneg diabetes  Renal/GU negative Renal ROS     Musculoskeletal negative musculoskeletal ROS (+)   Abdominal (+) - obese,   Peds  Hematology negative hematology ROS (+)   Anesthesia Other Findings Past Medical History: No date: Anxiety No date: Chronic leg pain No date: Depression No date: Schizoaffective disorder (HCC)   Reproductive/Obstetrics                             Anesthesia Physical  Anesthesia Plan  ASA: II  Anesthesia Plan: General   Post-op Pain Management:    Induction: Intravenous  Airway Management Planned: Mask  Additional Equipment:   Intra-op Plan:   Post-operative Plan:   Informed Consent: I have reviewed the patients History and Physical, chart, labs and discussed the procedure including the risks, benefits and alternatives for the proposed anesthesia with the patient or authorized representative who has indicated his/her understanding and acceptance.     Plan Discussed with: CRNA and Anesthesiologist  Anesthesia Plan Comments:         Anesthesia Quick  Evaluation  

## 2015-10-12 ENCOUNTER — Other Ambulatory Visit (HOSPITAL_COMMUNITY): Payer: 59 | Admitting: Psychiatry

## 2015-10-12 ENCOUNTER — Other Ambulatory Visit: Payer: Self-pay | Admitting: Psychiatry

## 2015-10-13 ENCOUNTER — Encounter: Payer: Self-pay | Admitting: Anesthesiology

## 2015-10-13 ENCOUNTER — Encounter (HOSPITAL_BASED_OUTPATIENT_CLINIC_OR_DEPARTMENT_OTHER)
Admission: RE | Admit: 2015-10-13 | Discharge: 2015-10-13 | Disposition: A | Discharge status: Home / Self Care | Payer: Managed Care, Other (non HMO) | Source: Ambulatory Visit | Attending: Psychiatry | Admitting: Psychiatry

## 2015-10-13 ENCOUNTER — Other Ambulatory Visit (HOSPITAL_COMMUNITY): Payer: 59

## 2015-10-13 DIAGNOSIS — F339 Major depressive disorder, recurrent, unspecified: Secondary | ICD-10-CM | POA: Diagnosis not present

## 2015-10-13 DIAGNOSIS — F332 Major depressive disorder, recurrent severe without psychotic features: Secondary | ICD-10-CM | POA: Diagnosis not present

## 2015-10-13 MED ORDER — KETOROLAC TROMETHAMINE 30 MG/ML IJ SOLN
30.0000 mg | Freq: Once | INTRAMUSCULAR | Status: AC
  Administered 2015-10-13: 30 mg via INTRAVENOUS

## 2015-10-13 MED ORDER — SUCCINYLCHOLINE CHLORIDE 200 MG/10ML IV SOSY
PREFILLED_SYRINGE | INTRAVENOUS | Status: DC | PRN
  Administered 2015-10-13: 200 mg via INTRAVENOUS

## 2015-10-13 MED ORDER — KETAMINE HCL 10 MG/ML IJ SOLN
INTRAMUSCULAR | Status: DC | PRN
  Administered 2015-10-13: 140 mg via INTRAVENOUS

## 2015-10-13 MED ORDER — MIDAZOLAM HCL 2 MG/2ML IJ SOLN
INTRAMUSCULAR | Status: DC | PRN
  Administered 2015-10-13: 2 mg via INTRAVENOUS

## 2015-10-13 MED ORDER — SODIUM CHLORIDE 0.9 % IV SOLN
INTRAVENOUS | Status: DC | PRN
  Administered 2015-10-13: 11:00:00 via INTRAVENOUS

## 2015-10-13 MED ORDER — LABETALOL HCL 5 MG/ML IV SOLN
INTRAVENOUS | Status: DC | PRN
  Administered 2015-10-13 (×2): 10 mg via INTRAVENOUS

## 2015-10-13 MED ORDER — HYDRALAZINE HCL 20 MG/ML IJ SOLN
INTRAMUSCULAR | Status: DC | PRN
  Administered 2015-10-13: 10 mg via INTRAVENOUS

## 2015-10-13 MED ORDER — HYDRALAZINE HCL 20 MG/ML IJ SOLN
10.0000 mg | Freq: Once | INTRAMUSCULAR | Status: AC
  Administered 2015-10-13: 10 mg via INTRAVENOUS

## 2015-10-13 MED ORDER — SODIUM CHLORIDE 0.9 % IV SOLN
500.0000 mL | Freq: Once | INTRAVENOUS | Status: AC
  Administered 2015-10-13: 500 mL via INTRAVENOUS

## 2015-10-13 MED ORDER — SUCCINYLCHOLINE CHLORIDE 200 MG/10ML IV SOSY: PREFILLED_SYRINGE | INTRAVENOUS | Status: DC | PRN

## 2015-10-13 MED ORDER — KETOROLAC TROMETHAMINE 30 MG/ML IJ SOLN
INTRAMUSCULAR | Status: AC
  Administered 2015-10-13: 30 mg via INTRAVENOUS
  Filled 2015-10-13: qty 1

## 2015-10-13 NOTE — Brief Op Note (Signed)
Dr. Henrene HawkingKephart informed of pt bp of 149/95 after 45min. Of monitoring. MD stated to discharge pt to home.

## 2015-10-13 NOTE — Anesthesia Postprocedure Evaluation (Signed)
Anesthesia Post Note  Patient: Jeffrey GillisBobby G Dagley  Procedure(s) Performed: * No procedures listed *  Patient location during evaluation: PACU Anesthesia Type: General Level of consciousness: awake and alert Pain management: pain level controlled Vital Signs Assessment: post-procedure vital signs reviewed and stable Respiratory status: spontaneous breathing and respiratory function stable Cardiovascular status: stable Anesthetic complications: no    Last Vitals:  Vitals:   10/13/15 1213 10/13/15 1223  BP: (!) 158/118 (!) 157/107  Pulse: 71 74  Resp: 14 (!) 22  Temp:      Last Pain:  Vitals:   10/13/15 1203  TempSrc:   PainSc: Asleep                 Jomo Forand K

## 2015-10-13 NOTE — Brief Op Note (Signed)
BP diastolic remains over 110. 154/115 at present. Dr. Henrene HawkingKephart stated to give him 10mg  hydralazine IV now and monitor for 45 minutes. Med given

## 2015-10-13 NOTE — Discharge Instructions (Signed)
1)  The drugs that you have been given will stay in your system until tomorrow so for the       next 24 hours you should not:  A. Drive an automobile  B. Make any legal decisions  C. Drink any alcoholic beverages  2)  You may resume your regular meals upon return home.  3)  A responsible adult must take you home.  Someone should stay with you for a few          hours, then be available by phone for the remainder of the treatment day.  4)  You May experience any of the following symptoms:  Headache, Nausea and a dry mouth (due to the medications you were given),  temporary memory loss and some confusion, or sore muscles (a warm bath  should help this).  If you you experience any of these symptoms let us know on                your return visit.  5)  Report any of the following: any acute discomfort, severe headache, or temperature        greater than 100.5 F.   Also report any unusual redness, swelling, drainage, or pain         at your IV site.    You may report Symptoms to:  ECT PROGRAM- [place] [facility]          Phone: [phone], ECT Department           or Dr. Shary Keylapac's office [phone]  6)  Your next ECT Treatment is Day Monday  Date September 25  We will call 2 days prior to your scheduled appointment for arrival times.  7)  Nothing to eat or drink after midnight the night before your procedure.  8)  Take Lisinopril and Lipitor     With a sip of water the morning of your procedure.  9)  Other Instructions: Call [phone] to cancel the morning of your procedure due         to illness or emergency.  10) We will call within 72 hours to assess how you are feeling.

## 2015-10-13 NOTE — H&P (Signed)
Jeffrey Lin is an 40 y.o. male.   Chief Complaint: Patient has no new complaint. Mood is getting better. Staying pretty stable. HPI: Long-standing major depression currently receiving ECT  Past Medical History:  Diagnosis Date  . Anxiety   . Chronic leg pain   . Depression   . Schizoaffective disorder [facility](HCC)     Past Surgical History:  Procedure Laterality Date  . Block procedure at pain center  03/27/06  . Bone graft from hip to jaw  2004  . Bone graft right side of head to jaw  2006  . 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 Sphenopalatine ganglionic      Family History  Problem Relation Age of Onset  . Hyperlipidemia Mother   . Hypertension Mother   . Depression Mother   . Anxiety disorder Mother   . Depression Brother   . Anxiety disorder Brother   . Bipolar disorder Maternal Grandfather   . Schizophrenia Maternal Grandfather   . Bipolar disorder Paternal Grandfather    Social History:  reports that he has been smoking Cigarettes.  He started smoking about 20 years ago. He has a 5.00 pack-year smoking history. He has quit using smokeless tobacco. He reports that he does not drink alcohol or use drugs.  Allergies:  Allergies  Allergen Reactions  . Toradol [Ketorolac Tromethamine] Swelling  . Lamotrigine Rash     (Not in a hospital admission)  No results found for this or any previous visit (from the past 48 hour(s)). No results found.  Review of Systems  Constitutional: Negative.   HENT: Negative.   Eyes: Negative.   Respiratory: Negative.   Cardiovascular: Negative.   Gastrointestinal: Negative.   Musculoskeletal: Negative.   Skin: Negative.   Neurological: Negative.   Psychiatric/Behavioral: Negative for depression, hallucinations, memory loss, substance abuse and suicidal ideas. The patient is not nervous/anxious and does not have insomnia.     Blood pressure (!) 152/89, pulse 74, temperature 97.7 F (36.5 C), temperature source Oral, resp.  rate 18, height 6\' 6"  (1.981 m), weight 110.7 kg (244 lb), SpO2 97 %. Physical Exam  Nursing note and vitals reviewed. Constitutional: He appears well-developed and well-nourished.  HENT:  Head: Normocephalic and atraumatic.  Eyes: Conjunctivae are normal. Pupils are equal, round, and reactive to light.  Neck: Normal range of motion.  Cardiovascular: Normal heart sounds.   Respiratory: Effort normal.  GI: Soft.  Musculoskeletal: Normal range of motion.  Neurological: He is alert.  Skin: Skin is warm and dry.  Psychiatric: Judgment and thought content normal. His mood appears not anxious. His speech is delayed. Cognition and memory are normal.     Assessment/Plan Follow-up into next week although we may be reaching a plateau  Jeffrey RasmussenJohn Clapacs, MD 10/13/2015, 11:24 AM
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
## 2015-10-13 NOTE — Transfer of Care (Signed)
Immediate Anesthesia Transfer of Care Note  Patient: Jeffrey Lin  Procedure(s) Performed: ECT  Patient Location: PACU  Anesthesia Type:General  Level of Consciousness: sedated  Airway & Oxygen Therapy: Patient Spontanous Breathing and Patient connected to face mask oxygen  Post-op Assessment: Report given to RN and Post -op Vital signs reviewed and stable  Post vital signs: Reviewed and stable  Last Vitals:  Vitals:   10/13/15 0912 10/13/15 1153  BP: (!) 152/89 (!) 170/116  Pulse: 74 72  Resp: 18 14  Temp: 36.5 C 36.4 C    Last Pain:  Vitals:   10/13/15 0912  TempSrc: Oral  PainSc:       Patients Stated Pain Goal: 0 (10/13/15 0910)  Complications: No apparent anesthesia complications

## 2015-10-13 NOTE — OR Nursing (Signed)
Dr. Henrene HawkingKephart contacted about patient's blood pressure, wants to observe him for a while and no meds at present time.

## 2015-10-13 NOTE — OR Nursing (Signed)
Dr. Henrene HawkingKephart contacted about patient's blood pressure and was okay with 157/107.  Plan is to take patient to post op and leave IV in the event of increased pressures.

## 2015-10-13 NOTE — Anesthesia Procedure Notes (Signed)
Date/Time: 10/13/2015 11:36 AM Performed by: Lily KocherPERALTA, Merrill Villarruel Pre-anesthesia Checklist: Patient identified, Emergency Drugs available, Suction available and Patient being monitored Patient Re-evaluated:Patient Re-evaluated prior to inductionOxygen Delivery Method: Circle system utilized Preoxygenation: Pre-oxygenation with 100% oxygen Intubation Type: IV induction Ventilation: Mask ventilation without difficulty and Mask ventilation throughout procedure Airway Equipment and Method: Bite block Placement Confirmation: positive ETCO2 Dental Injury: Teeth and Oropharynx as per pre-operative assessment

## 2015-10-13 NOTE — Procedures (Signed)
ECT SERVICES Physician's Interval Evaluation & Treatment Note  Patient Identification: Jeffrey Lin MRN:  [id number] Date of Evaluation:  10/13/2015 TX #: 6  MADRS:   MMSE:   P.E. Findings:  Lungs and heart normal. No change to physical.  Psychiatric Interval Note:  Mood is feeling a little better. Staying pretty stable. Minimal memory impairment.  Subjective:  Patient is a 40 y.o. male seen for evaluation for Electroconvulsive Therapy. Feels a little bit better but may be nearing a plateau.  Treatment Summary:   []   Right Unilateral             []  Bilateral   % Energy : 0.3 ms 100%   Impedance: 670 ohms  Seizure Energy Index: 1919 V squared  Postictal Suppression Index: 74%  Seizure Concordance Index: 79%  Medications  Pre Shock: Toradol 30 mg, ketamine 140 mg, succinylcholine 200 mg  Post Shock: Versed 2 mg  Seizure Duration: 20 seconds by EMG, 31 seconds by EEG   Comments: Follow-up Monday 3 times a week treatment schedule with reevaluation each treatment.   Lungs:  [x]   Clear to auscultation               []  Other:   Heart:    [x]   Regular rhythm             []  irregular rhythm    [x]   Previous H&P reviewed, patient examined and there are NO CHANGES                 []   Previous H&P reviewed, patient examined and there are changes noted.   Mordecai RasmussenJohn Clapacs, MD 9/22/201711:26 AM

## 2015-10-13 NOTE — Anesthesia Preprocedure Evaluation (Signed)
Anesthesia Evaluation  Patient identified by MRN, date of birth, ID band Patient awake    Reviewed: Allergy & Precautions, NPO status , Patient's Chart, lab work & pertinent test results  History of Anesthesia Complications Negative for: history of anesthetic complications  Airway Mallampati: III  TM Distance: >3 FB Neck ROM: Full  Mouth opening: Limited Mouth Opening  Dental  (+) Poor Dentition, Implants   Pulmonary neg sleep apnea, neg COPD, Current Smoker,    breath sounds clear to auscultation- rhonchi (-) wheezing      Cardiovascular Exercise Tolerance: Good Hypertension: hx of HTN, not currently on medication. (-) CAD and (-) Past MI  Rhythm:Regular Rate:Normal     Neuro/Psych negative neurological ROS     GI/Hepatic negative GI ROS, Neg liver ROS,   Endo/Other  negative endocrine ROSneg diabetes  Renal/GU negative Renal ROS     Musculoskeletal negative musculoskeletal ROS (+)   Abdominal (+) - obese,   Peds  Hematology negative hematology ROS (+)   Anesthesia Other Findings Past Medical History: No date: Anxiety No date: Chronic leg pain No date: Depression No date: Schizoaffective disorder [facility](HCC)   Reproductive/Obstetrics                             Anesthesia Physical  Anesthesia Plan  ASA: II  Anesthesia Plan: General   Post-op Pain Management:    Induction: Intravenous  Airway Management Planned: Mask  Additional Equipment:   Intra-op Plan:   Post-operative Plan:   Informed Consent: I have reviewed the patients History and Physical, chart, labs and discussed the procedure including the risks, benefits and alternatives for the proposed anesthesia with the patient or authorized representative who has indicated his/her understanding and acceptance.     Plan Discussed with: CRNA and Anesthesiologist  Anesthesia Plan Comments:         Anesthesia Quick  Evaluation

## 2015-10-14 ENCOUNTER — Other Ambulatory Visit: Payer: Self-pay | Admitting: Psychiatry

## 2015-10-16 ENCOUNTER — Encounter (HOSPITAL_BASED_OUTPATIENT_CLINIC_OR_DEPARTMENT_OTHER)
Admission: RE | Admit: 2015-10-16 | Discharge: 2015-10-16 | Disposition: A | Discharge status: Home / Self Care | Payer: Managed Care, Other (non HMO) | Source: Ambulatory Visit | Attending: Psychiatry | Admitting: Psychiatry

## 2015-10-16 ENCOUNTER — Encounter: Payer: Self-pay | Admitting: Certified Registered"

## 2015-10-16 ENCOUNTER — Other Ambulatory Visit (HOSPITAL_COMMUNITY): Payer: 59

## 2015-10-16 DIAGNOSIS — F332 Major depressive disorder, recurrent severe without psychotic features: Secondary | ICD-10-CM | POA: Diagnosis not present

## 2015-10-16 DIAGNOSIS — F339 Major depressive disorder, recurrent, unspecified: Secondary | ICD-10-CM | POA: Diagnosis not present

## 2015-10-16 MED ORDER — KETOROLAC TROMETHAMINE 30 MG/ML IJ SOLN
30.0000 mg | Freq: Once | INTRAMUSCULAR | Status: AC
  Administered 2015-10-16: 30 mg via INTRAVENOUS

## 2015-10-16 MED ORDER — KETOROLAC TROMETHAMINE 30 MG/ML IJ SOLN
INTRAMUSCULAR | Status: AC
  Administered 2015-10-16: 30 mg via INTRAVENOUS
  Filled 2015-10-16: qty 1

## 2015-10-16 MED ORDER — HYDRALAZINE HCL 20 MG/ML IJ SOLN
10.0000 mg | Freq: Once | INTRAMUSCULAR | Status: AC
  Administered 2015-10-16: 10 mg via INTRAVENOUS

## 2015-10-16 MED ORDER — ONDANSETRON HCL 4 MG/2ML IJ SOLN: 4.0000 mg | Freq: Once | INTRAMUSCULAR | Status: DC | PRN

## 2015-10-16 MED ORDER — LABETALOL HCL 5 MG/ML IV SOLN
5.0000 mg | INTRAVENOUS | Status: AC | PRN
  Administered 2015-10-16 (×2): 5 mg via INTRAVENOUS

## 2015-10-16 MED ORDER — SODIUM CHLORIDE 0.9 % IV SOLN
500.0000 mL | Freq: Once | INTRAVENOUS | Status: AC
  Administered 2015-10-16: 500 mL via INTRAVENOUS

## 2015-10-16 MED ORDER — KETAMINE HCL 10 MG/ML IJ SOLN
INTRAMUSCULAR | Status: DC | PRN
  Administered 2015-10-16: 140 mg via INTRAVENOUS

## 2015-10-16 MED ORDER — FENTANYL CITRATE (PF) 100 MCG/2ML IJ SOLN: 25.0000 ug | INTRAMUSCULAR | Status: DC | PRN

## 2015-10-16 MED ORDER — SUCCINYLCHOLINE CHLORIDE 20 MG/ML IJ SOLN
INTRAMUSCULAR | Status: DC | PRN
  Administered 2015-10-16: 200 mg via INTRAVENOUS

## 2015-10-16 MED ORDER — LABETALOL HCL 5 MG/ML IV SOLN
INTRAVENOUS | Status: DC | PRN
  Administered 2015-10-16: 20 mg via INTRAVENOUS

## 2015-10-16 MED ORDER — SODIUM CHLORIDE 0.9 % IV SOLN
INTRAVENOUS | Status: DC | PRN
  Administered 2015-10-16: 11:00:00 via INTRAVENOUS

## 2015-10-16 MED ORDER — MIDAZOLAM HCL 2 MG/2ML IJ SOLN
INTRAMUSCULAR | Status: DC | PRN
  Administered 2015-10-16: 2 mg via INTRAVENOUS

## 2015-10-16 NOTE — Transfer of Care (Signed)
Immediate Anesthesia Transfer of Care Note  Patient: Jeffrey GillisBobby G Lin  Procedure(s) Performed: * No procedures listed *  Patient Location: PACU  Anesthesia Type:General  Level of Consciousness: awake and patient cooperative  Airway & Oxygen Therapy: Patient Spontanous Breathing and Patient connected to face mask oxygen  Post-op Assessment: Report given to RN, Post -op Vital signs reviewed and stable and Patient moving all extremities X 4  Post vital signs: Reviewed and stable  Last Vitals:  Vitals:   10/16/15 1045 10/16/15 1117  BP: (!) 161/101 (!) (P) 175/117  Pulse:    Resp:    Temp:  (P) 36.3 C    Last Pain:  Vitals:   10/16/15 0848  TempSrc: Oral         Complications: No apparent anesthesia complications

## 2015-10-16 NOTE — Progress Notes (Signed)
Patient opened his eyes. This nurse provided reassurance that he was safe and in recovery. He verbalized understanding.

## 2015-10-16 NOTE — Procedures (Signed)
ECT SERVICES Physician's Interval Evaluation & Treatment Note  Patient Identification: Jeffrey Lin MRN:  [id number] Date of Evaluation:  10/16/2015 TX #: 7  MADRS: 17  MMSE: 30   P.E. Findings:  No change to physical exam. Vitals stable. Slightly high blood pressure again. Got labetalol before treatment.  Psychiatric Interval Note:  Mood continues to feel improved.  Subjective:  Patient is a 40 y.o. male seen for evaluation for Electroconvulsive Therapy. Feeling a little better. No new complaints.  Treatment Summary:   []   Right Unilateral             []  Bilateral   % Energy : 0.3 ms 100%   Impedance: 830 ohms  Seizure Energy Index: 5545 V squared  Postictal Suppression Index: 70%  Seizure Concordance Index: 94%  Medications  Pre Shock: Toradol 30 mg, labetalol 20 mg, ketamine 140 mg, succinylcholine 200 mg  Post Shock: Versed 2 mg  Seizure Duration: 19 seconds by EMG, 27 seconds by EEG   Comments: Follow-up in 1 week   Lungs:  [x]   Clear to auscultation               []  Other:   Heart:    [x]   Regular rhythm             []  irregular rhythm    [x]   Previous H&P reviewed, patient examined and there are NO CHANGES                 []   Previous H&P reviewed, patient examined and there are changes noted.   Mordecai RasmussenJohn Breea Loncar, MD 9/25/201710:55 AM

## 2015-10-16 NOTE — H&P (Signed)
Jeffrey Lin is an 40 y.o. male.   Chief Complaint: Mood is feeling better. HPI: History of recurrent depression  Past Medical History:  Diagnosis Date  . Anxiety   . Chronic leg pain   . Depression   . Schizoaffective disorder [facility](HCC)     Past Surgical History:  Procedure Laterality Date  . Block procedure at pain center  03/27/06  . Bone graft from hip to jaw  2004  . Bone graft right side of head to jaw  2006  . 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 Sphenopalatine ganglionic      Family History  Problem Relation Age of Onset  . Hyperlipidemia Mother   . Hypertension Mother   . Depression Mother   . Anxiety disorder Mother   . Depression Brother   . Anxiety disorder Brother   . Bipolar disorder Maternal Grandfather   . Schizophrenia Maternal Grandfather   . Bipolar disorder Paternal Grandfather    Social History:  reports that he has been smoking Cigarettes.  He started smoking about 20 years ago. He has a 5.00 pack-year smoking history. He has quit using smokeless tobacco. He reports that he does not drink alcohol or use drugs.  Allergies:  Allergies  Allergen Reactions  . Toradol [Ketorolac Tromethamine] Swelling  . Lamotrigine Rash     (Not in a hospital admission)  No results found for this or any previous visit (from the past 48 hour(s)). No results found.  Review of Systems  Constitutional: Negative.   HENT: Negative.   Eyes: Negative.   Respiratory: Negative.   Cardiovascular: Negative.   Gastrointestinal: Negative.   Musculoskeletal: Negative.   Skin: Negative.   Neurological: Negative.   Psychiatric/Behavioral: Negative for depression, hallucinations, memory loss, substance abuse and suicidal ideas. The patient is not nervous/anxious and does not have insomnia.     Blood pressure (!) 161/101, pulse 68, temperature 97.6 F (36.4 C), temperature source Oral, resp. rate 18, height 6\' 6"  (1.981 m), weight 110.2 kg (243 lb), SpO2 99  %. Physical Exam  Nursing note and vitals reviewed. Constitutional: He appears well-developed and well-nourished.  HENT:  Head: Normocephalic and atraumatic.  Eyes: Conjunctivae are normal. Pupils are equal, round, and reactive to light.  Neck: Normal range of motion.  Cardiovascular: Regular rhythm and normal heart sounds.   Respiratory: Effort normal. No respiratory distress.  GI: Soft.  Musculoskeletal: Normal range of motion.  Neurological: He is alert.  Skin: Skin is warm and dry.  Psychiatric: He has a normal mood and affect. His speech is normal and behavior is normal. Judgment and thought content normal. Cognition and memory are normal.     Assessment/Plan Doing well. Seems to probably have plateaued. We are going to stop 3 times a week treatment and switch to maintenance schedule for follow-up in 1 week.  Jeffrey RasmussenJohn Zacary Bauer, MD 10/16/2015, 10:53 AM

## 2015-10-16 NOTE — Anesthesia Preprocedure Evaluation (Signed)
Anesthesia Evaluation  Patient identified by MRN, date of birth, ID band Patient awake    Reviewed: Allergy & Precautions, H&P , NPO status , Patient's Chart, lab work & pertinent test results, reviewed documented beta blocker date and time   Airway Mallampati: II   Neck ROM: full    Dental  (+) Poor Dentition   Pulmonary neg pulmonary ROS, Current Smoker,    Pulmonary exam normal        Cardiovascular negative cardio ROS Normal cardiovascular exam Rhythm:regular Rate:Normal     Neuro/Psych negative neurological ROS  negative psych ROS   GI/Hepatic negative GI ROS, Neg liver ROS,   Endo/Other  negative endocrine ROS  Renal/GU negative Renal ROS  negative genitourinary   Musculoskeletal   Abdominal   Peds  Hematology negative hematology ROS (+)   Anesthesia Other Findings Past Medical History: No date: Anxiety No date: Chronic leg pain No date: Depression No date: Schizoaffective disorder [facility](HCC) Past Surgical History: 03/27/06: Block procedure at pain center 2004: Bone graft from hip to jaw 2006: Bone graft right side of head to jaw 01/2002: Melioblastoma     Comment: lower jaw removed No date: Sphenopalatine ganglionic BMI    Body Mass Index:  28.08 kg/m     Reproductive/Obstetrics                             Anesthesia Physical Anesthesia Plan  ASA: II  Anesthesia Plan: General   Post-op Pain Management:    Induction:   Airway Management Planned:   Additional Equipment:   Intra-op Plan:   Post-operative Plan:   Informed Consent: I have reviewed the patients History and Physical, chart, labs and discussed the procedure including the risks, benefits and alternatives for the proposed anesthesia with the patient or authorized representative who has indicated his/her understanding and acceptance.   Dental Advisory Given  Plan Discussed with: CRNA  Anesthesia Plan  Comments:         Anesthesia Quick Evaluation

## 2015-10-16 NOTE — Discharge Instructions (Signed)
1)  The drugs that you have been given will stay in your system until tomorrow so for the       next 24 hours you should not:  A. Drive an automobile  B. Make any legal decisions  C. Drink any alcoholic beverages  2)  You may resume your regular meals upon return home.  3)  A responsible adult must take you home.  Someone should stay with you for a few          hours, then be available by phone for the remainder of the treatment day.  4)  You May experience any of the following symptoms:  Headache, Nausea and a dry mouth (due to the medications you were given),  temporary memory loss and some confusion, or sore muscles (a warm bath  should help this).  If you you experience any of these symptoms let us know on                your return visit.  5)  Report any of the following: any acute discomfort, severe headache, or temperature        greater than 100.5 F.   Also report any unusual redness, swelling, drainage, or pain         at your IV site.    You may report Symptoms to:  ECT PROGRAM- [place] [facility]          Phone: [phone], ECT Department           or Dr. Shary Keylapac's office [phone]  6)  Your next ECT Treatment is Day Monday Date October 2  We will call 2 days prior to your scheduled appointment for arrival times.  7)  Nothing to eat or drink after midnight the night before your procedure.  8)  Take Lisinopril and Lipitor     With a sip of water the morning of your procedure.  9)  Other Instructions: Call [phone] to cancel the morning of your procedure due         to illness or emergency.  10) We will call within 72 hours to assess how you are feeling.

## 2015-10-16 NOTE — Progress Notes (Signed)
Patient is alert and oriented. This nurse called Dr. Pernell DupreAdams regarding the blood pressure and explained that it was his baseline. Dr. Madelaine BhatAdam suggested that the patient follow up with his primary care provider. This nurse provide the education to reduce if not quit smoking and follow up with his primary care provider. The patient verbalized understanding of the education. He is now going to be discharge from Same Day Surgery Unit.

## 2015-10-16 NOTE — Progress Notes (Signed)
Patient given a copy of his blood pressures from the procedure today. He was advised to give the pressure readings to the Primary care physician immediately.

## 2015-10-16 NOTE — Progress Notes (Signed)
1005 B/p 141/99. Labetalol 5 mg IV given per order.

## 2015-10-16 NOTE — Progress Notes (Signed)
Dr Pernell DupreAdams in to see pt.  Informed of increased B/ps with last procedures. B/p 172/101 at present time.  Labetalol ordered and 5mg  IV given per order.

## 2015-10-16 NOTE — Progress Notes (Signed)
Dr Pernell DupreAdams notified of pts B/p  155/101 after two dosage of 5mg  labetalol total of 10mg .

## 2015-10-17 ENCOUNTER — Other Ambulatory Visit (HOSPITAL_COMMUNITY): Payer: 59

## 2015-10-17 NOTE — Progress Notes (Signed)
  Patient ID: Jeffrey GillisBobby G Lin, male   DOB: 07/26/1975, 40 y.o.   MRN: [id number]   IOP Discharge:  Mr Jeffrey Lin began ECT therapy while in the IOP and that treatment supercedes the group therapy.  He was discharged on 10/12/2015.  Progress in the program helped maintain him but no real changes in depression while here.    Final diagnosis was schizoaffective disorder, depressed type

## 2015-10-17 NOTE — Anesthesia Postprocedure Evaluation (Signed)
Anesthesia Post Note  Patient: Jeffrey Lin  Procedure(s) Performed: * No procedures listed *  Patient location during evaluation: PACU Anesthesia Type: General Level of consciousness: awake and alert Pain management: pain level controlled Vital Signs Assessment: post-procedure vital signs reviewed and stable Respiratory status: spontaneous breathing, nonlabored ventilation, respiratory function stable and patient connected to nasal cannula oxygen Cardiovascular status: blood pressure returned to baseline and stable Postop Assessment: no signs of nausea or vomiting Anesthetic complications: no    Last Vitals:  Vitals:   10/16/15 1206 10/16/15 1217  BP: (!) 159/105 (!) 155/82  Pulse: 74   Resp:    Temp:      Last Pain:  Vitals:   10/16/15 1137  TempSrc: Oral  PainSc:                  Yevette EdwardsJames G Adams

## 2015-10-18 ENCOUNTER — Other Ambulatory Visit (HOSPITAL_COMMUNITY): Payer: 59

## 2015-10-19 ENCOUNTER — Other Ambulatory Visit (HOSPITAL_COMMUNITY): Payer: 59

## 2015-10-20 ENCOUNTER — Encounter: Payer: Self-pay | Admitting: Family Medicine

## 2015-10-20 ENCOUNTER — Ambulatory Visit (INDEPENDENT_AMBULATORY_CARE_PROVIDER_SITE_OTHER): Payer: Managed Care, Other (non HMO) | Admitting: Family Medicine

## 2015-10-20 ENCOUNTER — Telehealth: Payer: Self-pay | Admitting: Family Medicine

## 2015-10-20 ENCOUNTER — Other Ambulatory Visit (HOSPITAL_COMMUNITY): Payer: 59

## 2015-10-20 ENCOUNTER — Telehealth: Payer: Self-pay

## 2015-10-20 DIAGNOSIS — I10 Essential (primary) hypertension: Secondary | ICD-10-CM

## 2015-10-20 HISTORY — DX: Essential (primary) hypertension: I10

## 2015-10-20 LAB — BASIC METABOLIC PANEL
BUN: 9 mg/dL (ref 6–23)
CALCIUM: 9.1 mg/dL (ref 8.4–10.5)
CHLORIDE: 103 meq/L (ref 96–112)
CO2: 33 meq/L — AB (ref 19–32)
CREATININE: 1.11 mg/dL (ref 0.40–1.50)
GFR: 78.22 mL/min (ref >60.00)
Glucose, Bld: 94 mg/dL (ref 70–99)
Potassium: 3.8 mEq/L (ref 3.5–5.1)
SODIUM: 142 meq/L (ref 135–145)

## 2015-10-20 LAB — TSH: TSH: 0.71 u[IU]/mL (ref 0.35–4.50)

## 2015-10-20 MED ORDER — LISINOPRIL-HYDROCHLOROTHIAZIDE 20-25 MG PO TABS: 1.0000 | ORAL_TABLET | Freq: Every day | ORAL | 11 refills | Status: DC

## 2015-10-20 NOTE — Telephone Encounter (Signed)
Brently notified as instructed by telephone.

## 2015-10-20 NOTE — Patient Instructions (Addendum)
Stop at the lab on way out  Work on increasing exercsie as able, decrease salt to 2000-3000 mg daily, weight loss. Change lisinopril to new prescription with lisinopril 20/HCTZ 12.5 mg.  Follow and record BP at home. Bring to the next appt.

## 2015-10-20 NOTE — Telephone Encounter (Signed)
Pt left /vm requesting cb about lab results done earlier today and to see if pt can take BP med.

## 2015-10-20 NOTE — Progress Notes (Signed)
Pre visit review using our clinic review tool, if applicable. No additional management support is needed unless otherwise documented below in the visit note. 

## 2015-10-20 NOTE — Assessment & Plan Note (Signed)
Inadequate control. Encouraged exercise, weight loss, healthy eating habits. Low salt diet. Eval with labs for secondary cause. Only thing needed is thyroid eval. Increase to lisinopril and add HCTZ.  Follow up 2 weeks.

## 2015-10-20 NOTE — Progress Notes (Signed)
   Subjective:    Patient ID: Jeffrey Lin, male    DOB: 01/26/1975, 40 y.o.   MRN: [id number]  HPI   40 year old male currently S/P  ECT treatments for severe depression, schizoaffective do 3 X a week presents for  Follow up HTN.  He was seen on 9/19 by [name]  For elevated BP.  He has a  Strong family history of HTN.  Unable to continue ECT unless BP controlled. Started on lisinopril 10 mg daily.  Since then he reports BP still elevated at ECT treatment. [id number].Marland Kitchen. He has to be given IV med during the procedure. He reports no SE to the medication.  BP Readings from Last 3 Encounters:  10/20/15 (!) 120/94  10/10/15 (!) 140/98  09/29/15 132/87   09/29/2015 Nml CBCand CMET nml EKG  Wt Readings from Last 3 Encounters:  10/20/15 239 lb 12 oz (108.7 kg)  10/10/15 247 lb 8 oz (112.3 kg)  09/29/15 241 lb (109.3 kg)     Review of Systems  Constitutional: Negative for fatigue.  HENT: Negative for ear pain.   Eyes: Negative for redness.  Respiratory: Negative for shortness of breath.   Cardiovascular: Positive for leg swelling. Negative for chest pain and palpitations.       Mild, once last week  Endocrine: Negative for cold intolerance and heat intolerance.       Objective:   Physical Exam  Constitutional: Vital signs are normal. He appears well-developed and well-nourished.  Central obesity  HENT:  Head: Normocephalic.  Right Ear: Hearing normal.  Left Ear: Hearing normal.  Nose: Nose normal.  Mouth/Throat: Oropharynx is clear and moist and mucous membranes are normal.  Neck: Trachea normal. Carotid bruit is not present. No thyroid mass and no thyromegaly present.  Cardiovascular: Normal rate, regular rhythm and normal pulses.  Exam reveals no gallop, no distant heart sounds and no friction rub.   No murmur heard. No peripheral edema  Pulmonary/Chest: Effort normal and breath sounds normal. No respiratory distress.  Skin: Skin is warm, dry and intact. No  rash noted.  Psychiatric: He has a normal mood and affect. His speech is normal and behavior is normal. Thought content normal.          Assessment & Plan:

## 2015-10-20 NOTE — Telephone Encounter (Signed)
Notify pt labs normal.. Okay to increase medication for blood [pressure as discussed.

## 2015-10-20 NOTE — Telephone Encounter (Signed)
Raudel notified labs were normal.. Okay to increase medication for blood pressure as discussed at OV.

## 2015-10-20 NOTE — Telephone Encounter (Signed)
See lab note.  

## 2015-10-22 ENCOUNTER — Other Ambulatory Visit: Payer: Self-pay | Admitting: Psychiatry

## 2015-10-23 ENCOUNTER — Encounter
Admission: RE | Admit: 2015-10-23 | Discharge: 2015-10-23 | Disposition: A | Discharge status: Home / Self Care | Payer: 59 | Source: Ambulatory Visit | Attending: Psychiatry | Admitting: Psychiatry

## 2015-10-23 ENCOUNTER — Encounter: Payer: Self-pay | Admitting: Anesthesiology

## 2015-10-23 ENCOUNTER — Other Ambulatory Visit (HOSPITAL_COMMUNITY): Payer: 59

## 2015-10-23 DIAGNOSIS — F209 Schizophrenia, unspecified: Secondary | ICD-10-CM | POA: Insufficient documentation

## 2015-10-23 DIAGNOSIS — F332 Major depressive disorder, recurrent severe without psychotic features: Secondary | ICD-10-CM

## 2015-10-23 DIAGNOSIS — Z888 Allergy status to other drugs, medicaments and biological substances status: Secondary | ICD-10-CM | POA: Insufficient documentation

## 2015-10-23 DIAGNOSIS — F17210 Nicotine dependence, cigarettes, uncomplicated: Secondary | ICD-10-CM | POA: Insufficient documentation

## 2015-10-23 DIAGNOSIS — R569 Unspecified convulsions: Secondary | ICD-10-CM | POA: Diagnosis not present

## 2015-10-23 DIAGNOSIS — Z885 Allergy status to narcotic agent status: Secondary | ICD-10-CM | POA: Insufficient documentation

## 2015-10-23 DIAGNOSIS — F339 Major depressive disorder, recurrent, unspecified: Secondary | ICD-10-CM | POA: Diagnosis not present

## 2015-10-23 DIAGNOSIS — Z818 Family history of other mental and behavioral disorders: Secondary | ICD-10-CM | POA: Insufficient documentation

## 2015-10-23 DIAGNOSIS — Z9889 Other specified postprocedural states: Secondary | ICD-10-CM | POA: Insufficient documentation

## 2015-10-23 DIAGNOSIS — F419 Anxiety disorder, unspecified: Secondary | ICD-10-CM | POA: Diagnosis not present

## 2015-10-23 MED ORDER — SUCCINYLCHOLINE CHLORIDE 20 MG/ML IJ SOLN
INTRAMUSCULAR | Status: DC | PRN
  Administered 2015-10-23: 200 mg via INTRAVENOUS

## 2015-10-23 MED ORDER — SODIUM CHLORIDE 0.9 % IV SOLN
INTRAVENOUS | Status: DC | PRN
  Administered 2015-10-23 (×2): via INTRAVENOUS

## 2015-10-23 MED ORDER — KETOROLAC TROMETHAMINE 30 MG/ML IJ SOLN
30.0000 mg | Freq: Once | INTRAMUSCULAR | Status: AC
  Administered 2015-10-23: 30 mg via INTRAVENOUS

## 2015-10-23 MED ORDER — KETOROLAC TROMETHAMINE 30 MG/ML IJ SOLN
INTRAMUSCULAR | Status: AC
  Administered 2015-10-23: 30 mg via INTRAVENOUS
  Filled 2015-10-23: qty 1

## 2015-10-23 MED ORDER — HYDRALAZINE HCL 20 MG/ML IJ SOLN
10.0000 mg | Freq: Once | INTRAMUSCULAR | Status: AC
  Administered 2015-10-23: 10 mg via INTRAVENOUS

## 2015-10-23 MED ORDER — FENTANYL CITRATE (PF) 100 MCG/2ML IJ SOLN: 25.0000 ug | INTRAMUSCULAR | Status: DC | PRN

## 2015-10-23 MED ORDER — SODIUM CHLORIDE 0.9 % IV SOLN
500.0000 mL | Freq: Once | INTRAVENOUS | Status: AC
  Administered 2015-10-23: 500 mL via INTRAVENOUS

## 2015-10-23 MED ORDER — ONDANSETRON HCL 4 MG/2ML IJ SOLN: 4.0000 mg | Freq: Once | INTRAMUSCULAR | Status: DC | PRN

## 2015-10-23 MED ORDER — KETAMINE HCL 10 MG/ML IJ SOLN
INTRAMUSCULAR | Status: DC | PRN
  Administered 2015-10-23: 150 mg via INTRAVENOUS

## 2015-10-23 MED ORDER — MIDAZOLAM HCL 2 MG/2ML IJ SOLN
INTRAMUSCULAR | Status: DC | PRN
  Administered 2015-10-23: 2 mg via INTRAVENOUS

## 2015-10-23 MED ORDER — LABETALOL HCL 5 MG/ML IV SOLN
INTRAVENOUS | Status: DC | PRN
  Administered 2015-10-23: 20 mg via INTRAVENOUS

## 2015-10-23 NOTE — Transfer of Care (Signed)
Immediate Anesthesia Transfer of Care Note  Patient: Jeffrey Lin  Procedure(s) Performed: * No procedures listed *  Patient Location: PACU  Anesthesia Type:General  Level of Consciousness: sedated  Airway & Oxygen Therapy: Patient Spontanous Breathing and Patient connected to face mask oxygen  Post-op Assessment: Report given to RN and Post -op Vital signs reviewed and stable  Post vital signs: Reviewed and stable  Last Vitals:  Vitals:   10/23/15 0856 10/23/15 1041  BP: (!) 131/96 (!) 171/106  Pulse: 85 81  Resp: 18   Temp: 36.4 C (!) 36.1 C    Complications: No apparent anesthesia complications

## 2015-10-23 NOTE — Discharge Instructions (Signed)
1)  The drugs that you have been given will stay in your system until tomorrow so for the       next 24 hours you should not:  A. Drive an automobile  B. Make any legal decisions  C. Drink any alcoholic beverages  2)  You may resume your regular meals upon return home.  3)  A responsible adult must take you home.  Someone should stay with you for a few          hours, then be available by phone for the remainder of the treatment day.  4)  You May experience any of the following symptoms:  Headache, Nausea and a dry mouth (due to the medications you were given),  temporary memory loss and some confusion, or sore muscles (a warm bath  should help this).  If you you experience any of these symptoms let us know on                your return visit.  5)  Report any of the following: any acute discomfort, severe headache, or temperature        greater than 100.5 F.   Also report any unusual redness, swelling, drainage, or pain         at your IV site.    You may report Symptoms to:  ECT PROGRAM- [place] at [facility]          Phone: [phone], ECT Department           or Dr. Shary Keylapac's office [phone]  6)  Your next ECT Treatment is Day Monday   Date October 30, 2015   We will call 2 days prior to your scheduled appointment for arrival times.  7)  Nothing to eat or drink after midnight the night before your procedure.  8)  Take Lisinopril and Lipitor    With a sip of water the morning of your procedure.  9)  Other Instructions: Call [phone] to cancel the morning of your procedure due         to illness or emergency.  10) We will call within 72 hours to assess how you are feeling.

## 2015-10-23 NOTE — Progress Notes (Signed)
Patient's mother notified that the patient's next appointment is October 23, 2015 at 8:30

## 2015-10-23 NOTE — Anesthesia Procedure Notes (Signed)
Date/Time: 10/23/2015 10:23 AM Performed by: Stormy FabianURTIS, Keiasha Diep Pre-anesthesia Checklist: Patient identified, Emergency Drugs available, Suction available and Patient being monitored Patient Re-evaluated:Patient Re-evaluated prior to inductionOxygen Delivery Method: Circle system utilized Preoxygenation: Pre-oxygenation with 100% oxygen Intubation Type: IV induction Ventilation: Mask ventilation without difficulty and Mask ventilation throughout procedure Airway Equipment and Method: Bite block Placement Confirmation: positive ETCO2 Dental Injury: Teeth and Oropharynx as per pre-operative assessment

## 2015-10-23 NOTE — Anesthesia Postprocedure Evaluation (Signed)
Anesthesia Post Note  Patient: Jeffrey Lin  Procedure(s) Performed: * No procedures listed *  Patient location during evaluation: PACU Anesthesia Type: General Level of consciousness: awake and alert Pain management: pain level controlled Vital Signs Assessment: post-procedure vital signs reviewed and stable Respiratory status: spontaneous breathing, nonlabored ventilation and respiratory function stable Cardiovascular status: blood pressure returned to baseline and stable Postop Assessment: no signs of nausea or vomiting Anesthetic complications: no    Last Vitals:  Vitals:   10/23/15 1102 10/23/15 1107  BP: (!) 125/94   Pulse: 89 89  Resp: 20 14  Temp:  36.6 C    Last Pain:  Vitals:   10/23/15 1041  TempSrc: Temporal                 Hermen Mario

## 2015-10-23 NOTE — Procedures (Signed)
ECT SERVICES Physician's Interval Evaluation & Treatment Note  Patient Identification: Jeffrey Lin MRN:  [id number] Date of Evaluation:  10/23/2015 TX #: 8  MADRS:   MMSE:   P.E. Findings:  No change to physical exam heart and lungs normal. Blood pressure improved.  Psychiatric Interval Note:  Feeling good no return of major depression or suicidal thoughts.  Subjective:  Patient is a 40 y.o. male seen for evaluation for Electroconvulsive Therapy. No new complaints  Treatment Summary:   []   Right Unilateral             []  Bilateral   % Energy : 0.3 ms, 100%   Impedance: 1000 ohms  Seizure Energy Index: 8356 V squared  Postictal Suppression Index: No reading  Seizure Concordance Index: 95%  Medications  Pre Shock: Hydralazine 10 mg ketamine ketamine 150 mg, succinylcholine 200 mg  Post Shock: Versed 2 mg  Seizure Duration: 12 seconds by EMG, 21 seconds by EEG   Comments: Follow-up one week   Lungs:  [x]   Clear to auscultation               []  Other:   Heart:    [x]   Regular rhythm             []  irregular rhythm    [x]   Previous H&P reviewed, patient examined and there are NO CHANGES                 []   Previous H&P reviewed, patient examined and there are changes noted.   Mordecai RasmussenJohn Clapacs, MD 10/2/201710:19 AM

## 2015-10-23 NOTE — H&P (Signed)
Jeffrey Lin is an 40 y.o. male.   Chief Complaint: Patient has no new complaint. Still has mood ups and downs but feels like they are within the normal range not severely depressed. HPI: History of recurrent severe depression and has responded well to ECT for now moving into maintenance phase.  Past Medical History:  Diagnosis Date  . Anxiety   . Chronic leg pain   . Depression   . Essential hypertension, benign 10/20/2015  . Schizoaffective disorder [facility](HCC)     Past Surgical History:  Procedure Laterality Date  . Block procedure at pain center  03/27/06  . Bone graft from hip to jaw  2004  . Bone graft right side of head to jaw  2006  . 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 Sphenopalatine ganglionic      Family History  Problem Relation Age of Onset  . Hyperlipidemia Mother   . Hypertension Mother   . Depression Mother   . Anxiety disorder Mother   . Depression Brother   . Anxiety disorder Brother   . Bipolar disorder Maternal Grandfather   . Schizophrenia Maternal Grandfather   . Bipolar disorder Paternal Grandfather    Social History:  reports that he has been smoking Cigarettes.  He started smoking about 20 years ago. He has a 5.00 pack-year smoking history. He has quit using smokeless tobacco. He reports that he does not drink alcohol or use drugs.  Allergies:  Allergies  Allergen Reactions  . Toradol [Ketorolac Tromethamine] Swelling  . Lamotrigine Rash     (Not in a hospital admission)  No results found for this or any previous visit (from the past 48 hour(s)). No results found.  Review of Systems  Constitutional: Negative.   HENT: Negative.   Eyes: Negative.   Respiratory: Negative.   Cardiovascular: Negative.   Gastrointestinal: Negative.   Musculoskeletal: Negative.   Skin: Negative.   Neurological: Negative.   Psychiatric/Behavioral: Negative for depression, hallucinations, memory loss, substance abuse and suicidal ideas. The patient is not  nervous/anxious and does not have insomnia.     Blood pressure (!) 131/96, pulse 85, temperature 97.6 F (36.4 C), temperature source Oral, resp. rate 18, height 6\' 6"  (1.981 m), weight 107.5 kg (237 lb), SpO2 99 %. Physical Exam  Nursing note and vitals reviewed. Constitutional: He appears well-developed and well-nourished.  HENT:  Head: Normocephalic and atraumatic.  Eyes: Conjunctivae are normal. Pupils are equal, round, and reactive to light.  Neck: Normal range of motion.  Cardiovascular: Regular rhythm and normal heart sounds.   Respiratory: Effort normal. No respiratory distress.  GI: Soft.  Musculoskeletal: Normal range of motion.  Neurological: He is alert.  Skin: Skin is warm and dry.  Psychiatric: He has a normal mood and affect. His behavior is normal. Judgment and thought content normal.     Assessment/Plan Follow-up one week  Jeffrey RasmussenJohn Avila Albritton, MD 10/23/2015, 10:17 AM

## 2015-10-23 NOTE — Anesthesia Preprocedure Evaluation (Signed)
Anesthesia Evaluation  Patient identified by MRN, date of birth, ID band Patient awake    Reviewed: Allergy & Precautions, H&P , NPO status , Patient's Chart, lab work & pertinent test results, reviewed documented beta blocker date and time   Airway Mallampati: II   Neck ROM: full    Dental  (+) Poor Dentition, Teeth Intact   Pulmonary neg pulmonary ROS, Current Smoker,    Pulmonary exam normal        Cardiovascular hypertension, negative cardio ROS Normal cardiovascular exam Rhythm:regular Rate:Normal     Neuro/Psych PSYCHIATRIC DISORDERS negative neurological ROS  negative psych ROS   GI/Hepatic negative GI ROS, Neg liver ROS,   Endo/Other  negative endocrine ROS  Renal/GU negative Renal ROS  negative genitourinary   Musculoskeletal   Abdominal   Peds  Hematology negative hematology ROS (+)   Anesthesia Other Findings Past Medical History: No date: Anxiety No date: Chronic leg pain No date: Depression 10/20/2015: Essential hypertension, benign No date: Schizoaffective disorder [facility](HCC) Past Surgical History: 03/27/06: Block procedure at pain center 2004: Bone graft from hip to jaw 2006: Bone graft right side of head to jaw 01/2002: Melioblastoma     Comment: lower jaw removed No date: Sphenopalatine ganglionic BMI    Body Mass Index:  27.39 kg/m     Reproductive/Obstetrics                             Anesthesia Physical Anesthesia Plan  ASA: II  Anesthesia Plan: General   Post-op Pain Management:    Induction:   Airway Management Planned:   Additional Equipment:   Intra-op Plan:   Post-operative Plan:   Informed Consent: I have reviewed the patients History and Physical, chart, labs and discussed the procedure including the risks, benefits and alternatives for the proposed anesthesia with the patient or authorized representative who has indicated his/her understanding  and acceptance.   Dental Advisory Given  Plan Discussed with: CRNA  Anesthesia Plan Comments:         Anesthesia Quick Evaluation

## 2015-10-24 ENCOUNTER — Other Ambulatory Visit: Payer: Self-pay | Admitting: Family Medicine

## 2015-10-24 ENCOUNTER — Telehealth (HOSPITAL_COMMUNITY): Payer: Self-pay | Admitting: *Deleted

## 2015-10-24 ENCOUNTER — Other Ambulatory Visit (HOSPITAL_COMMUNITY): Payer: 59

## 2015-10-24 ENCOUNTER — Ambulatory Visit: Payer: Self-pay | Admitting: Family Medicine

## 2015-10-24 NOTE — Telephone Encounter (Signed)
Last office visit 10/20/2015.  Last refilled 09/28/2015 for #90 with no refills.  Ok to refill?

## 2015-10-24 NOTE — Telephone Encounter (Signed)
Called [employer] at [phone] for Prior authorization for ECT. Spoke with Kya F who states no authorization is required.

## 2015-10-25 ENCOUNTER — Other Ambulatory Visit (HOSPITAL_COMMUNITY): Payer: 59

## 2015-10-26 ENCOUNTER — Other Ambulatory Visit (HOSPITAL_COMMUNITY): Payer: 59

## 2015-10-27 ENCOUNTER — Other Ambulatory Visit (HOSPITAL_COMMUNITY): Payer: 59

## 2015-10-30 ENCOUNTER — Inpatient Hospital Stay: Admission: RE | Admit: 2015-10-30 | Payer: Self-pay | Source: Ambulatory Visit

## 2015-10-30 ENCOUNTER — Other Ambulatory Visit (HOSPITAL_COMMUNITY): Payer: 59

## 2015-10-30 DIAGNOSIS — F339 Major depressive disorder, recurrent, unspecified: Secondary | ICD-10-CM | POA: Diagnosis not present

## 2015-10-31 ENCOUNTER — Other Ambulatory Visit (HOSPITAL_COMMUNITY): Payer: 59

## 2015-11-01 ENCOUNTER — Encounter: Payer: Self-pay | Admitting: Family Medicine

## 2015-11-01 ENCOUNTER — Ambulatory Visit (INDEPENDENT_AMBULATORY_CARE_PROVIDER_SITE_OTHER): Payer: PRIVATE HEALTH INSURANCE | Admitting: Family Medicine

## 2015-11-01 ENCOUNTER — Other Ambulatory Visit (HOSPITAL_COMMUNITY): Payer: 59

## 2015-11-01 ENCOUNTER — Telehealth: Payer: Self-pay | Admitting: *Deleted

## 2015-11-01 VITALS — BP 108/60 | HR 100 | Temp 97.5°F | Wt 238.4 lb

## 2015-11-01 DIAGNOSIS — J069 Acute upper respiratory infection, unspecified: Secondary | ICD-10-CM

## 2015-11-01 DIAGNOSIS — B9789 Other viral agents as the cause of diseases classified elsewhere: Secondary | ICD-10-CM | POA: Diagnosis not present

## 2015-11-01 DIAGNOSIS — Z79899 Other long term (current) drug therapy: Secondary | ICD-10-CM | POA: Diagnosis not present

## 2015-11-01 DIAGNOSIS — D72829 Elevated white blood cell count, unspecified: Secondary | ICD-10-CM | POA: Diagnosis not present

## 2015-11-01 DIAGNOSIS — E7800 Pure hypercholesterolemia, unspecified: Secondary | ICD-10-CM | POA: Diagnosis not present

## 2015-11-01 DIAGNOSIS — J029 Acute pharyngitis, unspecified: Secondary | ICD-10-CM

## 2015-11-01 DIAGNOSIS — I10 Essential (primary) hypertension: Secondary | ICD-10-CM | POA: Diagnosis not present

## 2015-11-01 LAB — COMPREHENSIVE METABOLIC PANEL
ALK PHOS: 277 U/L — AB (ref 39–117)
ALT: 55 U/L — AB (ref 0–53)
AST: 27 U/L (ref 0–37)
Albumin: 4.2 g/dL (ref 3.5–5.2)
BILIRUBIN TOTAL: 0.6 mg/dL (ref 0.2–1.2)
BUN: 16 mg/dL (ref 6–23)
CALCIUM: 10 mg/dL (ref 8.4–10.5)
CO2: 32 mEq/L (ref 19–32)
Chloride: 95 mEq/L — ABNORMAL LOW (ref 96–112)
Creatinine, Ser: 1.26 mg/dL (ref 0.40–1.50)
GFR: 67.57 mL/min (ref >60.00)
Glucose, Bld: 96 mg/dL (ref 70–99)
Potassium: 4.2 mEq/L (ref 3.5–5.1)
Sodium: 137 mEq/L (ref 135–145)
TOTAL PROTEIN: 8.2 g/dL (ref 6.0–8.3)

## 2015-11-01 LAB — CBC
HEMATOCRIT: 47.6 % (ref 39.0–52.0)
HEMOGLOBIN: 16 g/dL (ref 13.0–17.0)
MCHC: 33.6 g/dL (ref 30.0–36.0)
MCV: 94.4 fl (ref 78.0–100.0)
PLATELETS: 304 10*3/uL (ref 150.0–400.0)
RBC: 5.04 Mil/uL (ref 4.22–5.81)
RDW: 14.2 % (ref 11.5–15.5)

## 2015-11-01 LAB — TSH: TSH: 1.87 u[IU]/mL (ref 0.35–4.50)

## 2015-11-01 LAB — LIPID PANEL
CHOLESTEROL: 145 mg/dL (ref 0–200)
HDL: 28.7 mg/dL — AB (ref >39.00)
LDL Cholesterol: 84 mg/dL (ref 0–99)
NonHDL: 116.58
TRIGLYCERIDES: 162 mg/dL — AB (ref 0.0–149.0)
Total CHOL/HDL Ratio: 5
VLDL: 32.4 mg/dL (ref 0.0–40.0)

## 2015-11-01 LAB — HEMOGLOBIN A1C: Hgb A1c MFr Bld: 5.9 % (ref 4.6–6.5)

## 2015-11-01 NOTE — Patient Instructions (Signed)
Ibuprofen 2-3 tablets every 8-12 hours for throat pain Drink enough water to make your urine light yellow  Pharyngitis Pharyngitis is redness, pain, and swelling (inflammation) of your pharynx.  CAUSES  Pharyngitis is usually caused by infection. Most of the time, these infections are from viruses (viral) and are part of a cold. However, sometimes pharyngitis is caused by bacteria (bacterial). Pharyngitis can also be caused by allergies. Viral pharyngitis may be spread from person to person by coughing, sneezing, and personal items or utensils (cups, forks, spoons, toothbrushes). Bacterial pharyngitis may be spread from person to person by more intimate contact, such as kissing.  SIGNS AND SYMPTOMS  Symptoms of pharyngitis include:   Sore throat.   Tiredness (fatigue).   Low-grade fever.   Headache.  Joint pain and muscle aches.  Skin rashes.  Swollen lymph nodes.  Plaque-like film on throat or tonsils (often seen with bacterial pharyngitis). DIAGNOSIS  Your health care provider will ask you questions about your illness and your symptoms. Your medical history, along with a physical exam, is often all that is needed to diagnose pharyngitis. Sometimes, a rapid strep test is done. Other lab tests may also be done, depending on the suspected cause.  TREATMENT  Viral pharyngitis will usually get better in 3-4 days without the use of medicine. Bacterial pharyngitis is treated with medicines that kill germs (antibiotics).  HOME CARE INSTRUCTIONS   Drink enough water and fluids to keep your urine clear or pale yellow.   Only take over-the-counter or prescription medicines as directed by your health care provider:   If you are prescribed antibiotics, make sure you finish them even if you start to feel better.   Do not take aspirin.   Get lots of rest.   Gargle with 8 oz of salt water ( tsp of salt per 1 qt of water) as often as every 1-2 hours to soothe your throat.    Throat lozenges (if you are not at risk for choking) or sprays may be used to soothe your throat. SEEK MEDICAL CARE IF:   You have large, tender lumps in your neck.  You have a rash.  You cough up green, yellow-brown, or bloody spit. SEEK IMMEDIATE MEDICAL CARE IF:   Your neck becomes stiff.  You drool or are unable to swallow liquids.  You vomit or are unable to keep medicines or liquids down.  You have severe pain that does not go away with the use of recommended medicines.  You have trouble breathing (not caused by a stuffy nose). MAKE SURE YOU:   Understand these instructions.  Will watch your condition.  Will get help right away if you are not doing well or get worse.   This information is not intended to replace advice given to you by your health care provider. Make sure you discuss any questions you have with your health care provider.   Document Released: 01/07/2005 Document Revised: 10/28/2012 Document Reviewed: 09/14/2012 Elsevier Interactive Patient Education [employer]2016 Elsevier Inc.

## 2015-11-01 NOTE — Telephone Encounter (Signed)
Bobbie from [facility] called with critical results on pt. WBC 21.0. Pending all tests to be resulted before [facility] will be [facility]. Hardcopy of results printed out and given to Northrop GrummanDebbie Gessner.

## 2015-11-01 NOTE — Progress Notes (Signed)
Subjective:    Patient ID: Jeffrey Lin, male    DOB: 12-24-1975, 40 y.o.   MRN: [id number]  HPI This is a 40 yo male who presents today with sore throat for 4 days, feels like his throat is on fire. Voice has been hoarse. Has had occasionally productive cough. No SOB or wheezing. No ear pain. Has not taken any medicine for pain. Is able to eat and drink ok. No sick contacts.  Has been working to lose weight recently, has been increasing his exercise with walking his 40 yo puggle.  Dr. Evelene Croon has requested labs be checked, will check today.  Past Medical History:  Diagnosis Date  . Anxiety   . Chronic leg pain   . Depression   . Essential hypertension, benign 10/20/2015  . Schizoaffective disorder [facility])    Past Surgical History:  Procedure Laterality Date  . Block procedure at pain center  03/27/06  . Bone graft from hip to jaw  2004  . Bone graft right side of head to jaw  2006  . 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 Sphenopalatine ganglionic     Family History  Problem Relation Age of Onset  . Hyperlipidemia Mother   . Hypertension Mother   . Depression Mother   . Anxiety disorder Mother   . Depression Brother   . Anxiety disorder Brother   . Bipolar disorder Maternal Grandfather   . Schizophrenia Maternal Grandfather   . Bipolar disorder Paternal Grandfather    Social History  Substance Use Topics  . Smoking status: Current Every Day Smoker    Packs/day: 0.50    Years: 10.00    Types: Cigarettes    Start date: 09/22/1995  . Smokeless tobacco: Former Neurosurgeon  . Alcohol use No       Review of Systems Per HPI    Objective:   Physical Exam  Constitutional: He is oriented to person, place, and time. He appears well-developed and well-nourished. No distress.  HENT:  Head: Normocephalic and atraumatic.  Right Ear: Tympanic membrane, external ear and ear canal normal.  Left Ear: Tympanic membrane, external ear and ear canal normal.  Nose: Nose normal. No  mucosal edema or rhinorrhea.  Mouth/Throat: Uvula is midline. No oropharyngeal exudate, posterior oropharyngeal edema or posterior oropharyngeal erythema.  Sounds hoarse.   Eyes: Conjunctivae are normal.  Neck: Normal range of motion. Neck supple.  Cardiovascular: Normal rate, regular rhythm and normal heart sounds.   Pulmonary/Chest: Effort normal and breath sounds normal.  Lymphadenopathy:    He has no cervical adenopathy.  Neurological: He is alert and oriented to person, place, and time.  Skin: Skin is warm and dry. He is not diaphoretic.  Psychiatric: He has a normal mood and affect. His behavior is normal. Judgment and thought content normal.  Vitals reviewed.     BP 108/60   Pulse 100   Temp 97.5 F (36.4 C)   Wt 238 lb 6.4 oz (108.1 kg)   SpO2 91%   BMI 27.55 kg/m  Wt Readings from Last 3 Encounters:  11/01/15 238 lb 6.4 oz (108.1 kg)  10/20/15 239 lb 12 oz (108.7 kg)  10/10/15 247 lb 8 oz (112.3 kg)       Assessment & Plan:  1. Sore throat - suspect viral illness with hoarse voice and no physical findings concerning for strep  2. Essential hypertension - BP 108/60 today - CBC - Comprehensive metabolic panel  3. HYPERCHOLESTEROLEMIA - Comprehensive  metabolic panel - Lipid panel - TSH - Hemoglobin A1c  4. High risk medication use - CBC - Lipid panel - TSH - Hemoglobin A1c  5. Viral URI - Provided written and verbal information regarding diagnosis and treatment. - increase fluids, acetaminophen/ibuprofen for throat pain - RTC precautions reviewed   Olean Ree, FNP-BC  [facility], [facility]  11/01/2015 11:23 AM

## 2015-11-01 NOTE — Addendum Note (Signed)
Addended by: Olean ReeGESSNER, DEBORAH B on: 11/01/2015 04:10 PM   Modules accepted: Orders

## 2015-11-02 ENCOUNTER — Other Ambulatory Visit (HOSPITAL_COMMUNITY): Payer: 59

## 2015-11-02 ENCOUNTER — Other Ambulatory Visit (INDEPENDENT_AMBULATORY_CARE_PROVIDER_SITE_OTHER): Payer: PRIVATE HEALTH INSURANCE

## 2015-11-02 DIAGNOSIS — D72829 Elevated white blood cell count, unspecified: Secondary | ICD-10-CM | POA: Diagnosis not present

## 2015-11-02 LAB — CBC WITH DIFFERENTIAL/PLATELET
BASOS PCT: 0.3 % (ref 0.0–3.0)
Basophils Absolute: 0 10*3/uL (ref 0.0–0.1)
EOS PCT: 1.9 % (ref 0.0–5.0)
Eosinophils Absolute: 0.3 10*3/uL (ref 0.0–0.7)
HCT: 44.7 % (ref 39.0–52.0)
Hemoglobin: 15.2 g/dL (ref 13.0–17.0)
LYMPHS ABS: 4 10*3/uL (ref 0.7–4.0)
Lymphocytes Relative: 28.7 % (ref 12.0–46.0)
MCHC: 34.1 g/dL (ref 30.0–36.0)
MCV: 94.1 fl (ref 78.0–100.0)
MONOS PCT: 5.2 % (ref 3.0–12.0)
Monocytes Absolute: 0.7 10*3/uL (ref 0.1–1.0)
NEUTROS ABS: 9 10*3/uL — AB (ref 1.4–7.7)
Neutrophils Relative %: 63.9 % (ref 43.0–77.0)
Platelets: 285 10*3/uL (ref 150.0–400.0)
RBC: 4.75 Mil/uL (ref 4.22–5.81)
RDW: 14.2 % (ref 11.5–15.5)
WBC: 14.1 10*3/uL — ABNORMAL HIGH (ref 4.0–10.5)

## 2015-11-03 ENCOUNTER — Other Ambulatory Visit (HOSPITAL_COMMUNITY): Payer: 59

## 2015-11-03 ENCOUNTER — Other Ambulatory Visit: Payer: Self-pay | Admitting: Psychiatry

## 2015-11-03 NOTE — Telephone Encounter (Signed)
Pt called back stating he received my chart message with lab results.  He would like a call back to have someone explain lab results to him Best number [phone]

## 2015-11-03 NOTE — Telephone Encounter (Signed)
Called patient and explained WBC results and improvement from two days ago to yesterday. He reports that he is feeling better today.

## 2015-11-06 ENCOUNTER — Other Ambulatory Visit (HOSPITAL_COMMUNITY): Payer: 59

## 2015-11-06 ENCOUNTER — Encounter: Payer: Self-pay | Admitting: Anesthesiology

## 2015-11-06 ENCOUNTER — Inpatient Hospital Stay: Admission: RE | Admit: 2015-11-06 | Payer: Self-pay | Source: Ambulatory Visit

## 2015-11-07 ENCOUNTER — Ambulatory Visit (INDEPENDENT_AMBULATORY_CARE_PROVIDER_SITE_OTHER): Payer: PRIVATE HEALTH INSURANCE | Admitting: Family Medicine

## 2015-11-07 ENCOUNTER — Encounter: Payer: Self-pay | Admitting: Family Medicine

## 2015-11-07 ENCOUNTER — Other Ambulatory Visit (HOSPITAL_COMMUNITY): Payer: 59

## 2015-11-07 VITALS — BP 120/92 | HR 92 | Temp 97.4°F | Ht 78.0 in | Wt 238.5 lb

## 2015-11-07 DIAGNOSIS — B370 Candidal stomatitis: Secondary | ICD-10-CM

## 2015-11-07 DIAGNOSIS — I10 Essential (primary) hypertension: Secondary | ICD-10-CM | POA: Diagnosis not present

## 2015-11-07 DIAGNOSIS — B9789 Other viral agents as the cause of diseases classified elsewhere: Secondary | ICD-10-CM | POA: Diagnosis not present

## 2015-11-07 DIAGNOSIS — J069 Acute upper respiratory infection, unspecified: Secondary | ICD-10-CM

## 2015-11-07 DIAGNOSIS — D72829 Elevated white blood cell count, unspecified: Secondary | ICD-10-CM | POA: Diagnosis not present

## 2015-11-07 MED ORDER — NYSTATIN 100000 UNIT/ML MT SUSP: 5.0000 mL | Freq: Four times a day (QID) | OROMUCOSAL | 1 refills | Status: DC

## 2015-11-07 NOTE — Assessment & Plan Note (Addendum)
?   If related to meds given at ECT. Likely cause of throat issues.  Treat with nystatin sol.

## 2015-11-07 NOTE — Assessment & Plan Note (Signed)
Likely due to current viral infection. ? If given steroids at ECT. ? releated to ECT.  Follow over time.

## 2015-11-07 NOTE — Assessment & Plan Note (Signed)
Symptomatic care 

## 2015-11-07 NOTE — Progress Notes (Signed)
Pre visit review using our clinic review tool, if applicable. No additional management support is needed unless otherwise documented below in the visit note. 

## 2015-11-07 NOTE — Assessment & Plan Note (Signed)
Follow at home.. To determine if improved control... Was at goal at last week OV.

## 2015-11-07 NOTE — Progress Notes (Signed)
   Subjective:    Patient ID: Jeffrey Lin, male    DOB: 02/11/1975, 40 y.o.   MRN: [id number]   HPI  [age] year old male with shizoaffective disorder on ECT treatments presents for HTN follow up.   At last appt increased lisinopril and added HCTZ  given increased BP with ECT treatments. BP Readings from Last 3 Encounters:  11/07/15 (!) 120/92  11/01/15 108/60  10/20/15 (!) 120/94  Using medication without problems or lightheadedness:  Chest pain with exertion: None,  occ chest pain with coughing. Edema: None Short of breath: None Average home BPs: not checking Other issues:  He currently has had some sore throat and hoarse voice in last week. Had routine white blood count.. Wbc initially 21.. Then on recheck down to 14.  Always has smokers cough, but worse in last week worse cough,  Now yellow, tan. No SOB, no wheezing.   Review of Systems  Constitutional: Negative for fatigue and fever.  HENT: Negative for ear pain.   Eyes: Negative for pain.  Respiratory: Positive for cough. Negative for shortness of breath.   Cardiovascular: Negative for leg swelling.  Gastrointestinal: Negative for abdominal pain.       Objective:   Physical Exam  Constitutional: Vital signs are normal. He appears well-developed and well-nourished.  HENT:  Head: Normocephalic.  Right Ear: Hearing normal.  Left Ear: Hearing normal.  Nose: Nose normal.  Mouth/Throat: Mucous membranes are normal. Posterior oropharyngeal erythema present. No posterior oropharyngeal edema or tonsillar abscesses.  Bilateral cerumen, not impracted but unable to see ear drums.  White plaque under tounge on buccal mucosa  Neck: Trachea normal. Carotid bruit is not present. No thyroid mass and no thyromegaly present.  Cardiovascular: Normal rate, regular rhythm and normal pulses.  Exam reveals no gallop, no distant heart sounds and no friction rub.   No murmur heard. No peripheral edema  Pulmonary/Chest: Effort normal  and breath sounds normal. No respiratory distress.  Skin: Skin is warm, dry and intact. No rash noted.  Psychiatric: He has a normal mood and affect. His speech is normal and behavior is normal. Thought content normal.          Assessment & Plan:

## 2015-11-07 NOTE — Patient Instructions (Addendum)
Start nystatin swish to treat yeast in mouth. Current BP  Follow BP at home, goal < 140/90.. Call if remaining elevated.  Continue  BP medication.  Call if sore throat is not improving as we expect. Ask psychiatrist at ECT treatment if on any steroid or if ECT can elevate white blood cell count. IF not.. It is likely due to viral infection. 10/11 wbc 21  10/12  Wbc 14

## 2015-11-08 ENCOUNTER — Other Ambulatory Visit (HOSPITAL_COMMUNITY): Payer: 59

## 2015-11-09 ENCOUNTER — Other Ambulatory Visit (HOSPITAL_COMMUNITY): Payer: 59

## 2015-11-10 ENCOUNTER — Other Ambulatory Visit (HOSPITAL_COMMUNITY): Payer: 59

## 2015-11-16 ENCOUNTER — Telehealth: Payer: Self-pay | Admitting: Family Medicine

## 2015-11-16 DIAGNOSIS — E559 Vitamin D deficiency, unspecified: Secondary | ICD-10-CM

## 2015-11-16 DIAGNOSIS — R7309 Other abnormal glucose: Secondary | ICD-10-CM

## 2015-11-16 DIAGNOSIS — D72829 Elevated white blood cell count, unspecified: Secondary | ICD-10-CM

## 2015-11-16 DIAGNOSIS — E7800 Pure hypercholesterolemia, unspecified: Secondary | ICD-10-CM

## 2015-11-16 NOTE — Telephone Encounter (Signed)
-----   Message from Baldomero LamyNatasha C Chavers sent at 11/10/2015  3:30 PM EDT ----- Regarding: Cpx labs Fri 10/27, need orders. Thanks! :-) Please order  future cpx labs for pt's upcoming lab appt. Thanks Rodney Boozeasha

## 2015-11-20 ENCOUNTER — Other Ambulatory Visit: Payer: Self-pay | Admitting: Family Medicine

## 2015-11-20 ENCOUNTER — Other Ambulatory Visit (INDEPENDENT_AMBULATORY_CARE_PROVIDER_SITE_OTHER): Payer: PRIVATE HEALTH INSURANCE

## 2015-11-20 DIAGNOSIS — D72829 Elevated white blood cell count, unspecified: Secondary | ICD-10-CM | POA: Diagnosis not present

## 2015-11-20 LAB — CBC WITH DIFFERENTIAL/PLATELET
Basophils Absolute: 0.1 10*3/uL (ref 0.0–0.1)
Basophils Relative: 0.3 % (ref 0.0–3.0)
Eosinophils Absolute: 0.2 10*3/uL (ref 0.0–0.7)
Eosinophils Relative: 1.4 % (ref 0.0–5.0)
HCT: 44.1 % (ref 39.0–52.0)
Hemoglobin: 15.1 g/dL (ref 13.0–17.0)
Lymphocytes Relative: 19.7 % (ref 12.0–46.0)
Lymphs Abs: 3 10*3/uL (ref 0.7–4.0)
MCHC: 34.3 g/dL (ref 30.0–36.0)
MCV: 92.3 fl (ref 78.0–100.0)
Monocytes Absolute: 0.7 10*3/uL (ref 0.1–1.0)
Monocytes Relative: 4.4 % (ref 3.0–12.0)
Neutro Abs: 11.4 10*3/uL — ABNORMAL HIGH (ref 1.4–7.7)
Neutrophils Relative %: 74.2 % (ref 43.0–77.0)
Platelets: 289 10*3/uL (ref 150.0–400.0)
RBC: 4.78 Mil/uL (ref 4.22–5.81)
RDW: 13.9 % (ref 11.5–15.5)
WBC: 15.4 10*3/uL — ABNORMAL HIGH (ref 4.0–10.5)

## 2015-11-20 NOTE — Telephone Encounter (Signed)
Last office visit 11/07/2015.  Last refilled 10/24/2015 for #90 with no refills. Ok to refill?

## 2015-11-23 ENCOUNTER — Encounter: Payer: Self-pay | Admitting: Family Medicine

## 2015-11-23 ENCOUNTER — Ambulatory Visit (INDEPENDENT_AMBULATORY_CARE_PROVIDER_SITE_OTHER): Payer: PRIVATE HEALTH INSURANCE | Admitting: Family Medicine

## 2015-11-23 VITALS — BP 112/86 | HR 84 | Temp 97.5°F | Ht 76.0 in | Wt 243.8 lb

## 2015-11-23 DIAGNOSIS — E7800 Pure hypercholesterolemia, unspecified: Secondary | ICD-10-CM

## 2015-11-23 DIAGNOSIS — E559 Vitamin D deficiency, unspecified: Secondary | ICD-10-CM | POA: Diagnosis not present

## 2015-11-23 DIAGNOSIS — R490 Dysphonia: Secondary | ICD-10-CM

## 2015-11-23 DIAGNOSIS — I10 Essential (primary) hypertension: Secondary | ICD-10-CM | POA: Diagnosis not present

## 2015-11-23 DIAGNOSIS — D72829 Elevated white blood cell count, unspecified: Secondary | ICD-10-CM | POA: Diagnosis not present

## 2015-11-23 DIAGNOSIS — R0989 Other specified symptoms and signs involving the circulatory and respiratory systems: Secondary | ICD-10-CM | POA: Diagnosis not present

## 2015-11-23 DIAGNOSIS — Z0000 Encounter for general adult medical examination without abnormal findings: Secondary | ICD-10-CM | POA: Diagnosis not present

## 2015-11-23 DIAGNOSIS — R7309 Other abnormal glucose: Secondary | ICD-10-CM

## 2015-11-23 DIAGNOSIS — F251 Schizoaffective disorder, depressive type: Secondary | ICD-10-CM | POA: Diagnosis not present

## 2015-11-23 MED ORDER — LOSARTAN POTASSIUM-HCTZ 50-12.5 MG PO TABS: 1.0000 | ORAL_TABLET | Freq: Every day | ORAL | 11 refills | Status: DC

## 2015-11-23 NOTE — Assessment & Plan Note (Signed)
Followed by psych. Improved with recent ECT.

## 2015-11-23 NOTE — Assessment & Plan Note (Signed)
Well controlled but ? SE.Marland Kitchen. Change to losartan HCTZ and follow BPs.

## 2015-11-23 NOTE — Progress Notes (Signed)
Pre visit review using our clinic review tool, if applicable. No additional management support is needed unless otherwise documented below in the visit note. 

## 2015-11-23 NOTE — Progress Notes (Signed)
Subjective:    Patient ID: Jeffrey GillisBobby G Sramek, male    DOB: 09/14/1975, 40 y.o.   MRN: [id number]  HPI   The patient is here for annual wellness exam and preventative care.     Leukocytosis: Felt initially due to viral syndrome start 3 weeks .  Persistent elevation at last check cbc 21 down to  14 then down to 3 days ago15. Still hoarse voice,  No longer sore throat. Now feels like something stuck in throat x 1 week. Can clear with coughing  No fever, no cough. No ear pain, no face pain.  Smoker.  he os now on ACEI.Marland Kitchen. No lip or tounge swelling.  Elevated Cholesterol:  Well control on lipitor. Lab Results  Component Value Date   CHOL 145 11/01/2015   HDL 28.70 (L) 11/01/2015   LDLCALC 84 11/01/2015   LDLDIRECT 84.0 05/10/2015   TRIG 162.0 (H) 11/01/2015   CHOLHDL 5 11/01/2015  Using medications without problems: Muscle aches:  No Diet compliance: Moderate, drinks 6 soda Exercise: Walking  1 mile Other complaints:   Wt Readings from Last 3 Encounters:  11/23/15 243 lb 12 oz (110.6 kg)  11/07/15 238 lb 8 oz (108.2 kg)  11/01/15 238 lb 6.4 oz (108.1 kg)   Body mass index is 29.67 kg/m.  Hypertension:    Improved ocntrol on current meds. BP Readings from Last 3 Encounters:  11/23/15 112/86  11/07/15 (!) 120/92  11/01/15 108/60   Using medication without problems or lightheadedness:  Chest pain with exertion: None Edema: None Short of breath: None Average home BPs: good control. Other issues:   Prediabetes:  Improved, glucose 87 Lab Results  Component Value Date   HGBA1C 5.9 11/01/2015    Mood followed by psych.. Just finished ECT treatments. Improved.  Social History /Family History/Past Medical History reviewed and updated if needed.  Review of Systems  Constitutional: Negative for fatigue and fever.  HENT: Negative for ear pain.   Eyes: Negative for pain.  Respiratory: Negative for cough and shortness of breath.   Cardiovascular: Negative for chest pain,  palpitations and leg swelling.  Gastrointestinal: Negative for abdominal pain.  Genitourinary: Negative for dysuria.  Musculoskeletal: Negative for arthralgias.  Neurological: Negative for syncope, light-headedness and headaches.  Psychiatric/Behavioral: Negative for dysphoric mood.       Objective:   Physical Exam  Constitutional: Vital signs are normal. He appears well-developed and well-nourished.  Hoarse voice  no thrush  HENT:  Head: Normocephalic.  Right Ear: Hearing normal.  Left Ear: Hearing normal.  Nose: Nose normal.  Mouth/Throat: Oropharynx is clear and moist and mucous membranes are normal. Abnormal dentition. No dental abscesses. No oropharyngeal exudate, posterior oropharyngeal edema, posterior oropharyngeal erythema or tonsillar abscesses.  Neck: Trachea normal. Carotid bruit is not present. No thyroid mass and no thyromegaly present.  Cardiovascular: Normal rate, regular rhythm and normal pulses.  Exam reveals no gallop, no distant heart sounds and no friction rub.   No murmur heard. No peripheral edema  Pulmonary/Chest: Effort normal and breath sounds normal. No respiratory distress.  Skin: Skin is warm, dry and intact. No rash noted.  Psychiatric: Judgment and thought content normal. His affect is blunt. He is slowed and withdrawn. Cognition and memory are normal.          Assessment & Plan:  The patient's preventative maintenance and recommended screening tests for an annual wellness exam were reviewed in full today. Brought up to date unless services declined.  Counselled on the  importance of diet, exercise, and its role in overall health and mortality. The patient's FH and SH was reviewed, including their home life, tobacco status, and drug and alcohol status.   Vaccines: refused flu vaccine. Uptodate tdap. HIV: refused  No early family history of colon cancer/prostate. Smoker.. 1/2 pack per day.  Gential exam not indicated.

## 2015-11-23 NOTE — Assessment & Plan Note (Signed)
LDL at goal but trigs high on liptior. Encouraged exercise, weight loss, healthy eating habits.

## 2015-11-23 NOTE — Patient Instructions (Addendum)
Decrease soda, drink more water.  Quit smoking!!!  Stop lisinopril/HCTZ as may be causing horse voice.  Change to losartan HCTZ.  Follow BP at  home,  Call in 1 week if hoarse voice is not improving.  Call sooner if fever or worsening symptoms.

## 2015-11-23 NOTE — Assessment & Plan Note (Signed)
Stable control.. Stop soda.

## 2015-11-23 NOTE — Assessment & Plan Note (Signed)
UNclear cause.Marland Kitchen. No clear sign of bacterial infection. ? Related to recent ECT treatments. ? Related to hoarse voice.

## 2015-11-23 NOTE — Assessment & Plan Note (Signed)
?   Secondary to ACEI.. Change to ARB. If not improving may need referral to ENT for further  eval with laryngoscope given smoker.

## 2015-11-27 ENCOUNTER — Other Ambulatory Visit: Payer: Self-pay | Admitting: Family Medicine

## 2015-11-27 NOTE — Telephone Encounter (Signed)
Last office visit 11/23/2015.  Last refilled 07/09/2015 for #60 with 2 refills.  Ok to refill?

## 2015-12-04 ENCOUNTER — Telehealth: Payer: Self-pay | Admitting: Family Medicine

## 2015-12-04 NOTE — Telephone Encounter (Signed)
Long term disability paperwork in Dr Ermalene SearingBedsole  IN BOX

## 2015-12-07 NOTE — Telephone Encounter (Signed)
Dr Ermalene Searingbedsole gave paperwork back to me with a note stating "I tried to fill this out but I really need details from pt of his difficultios or have his pshyc complete this form"  Spoke with pt to let him know this.  I ask him if his pshyc dr could fill this out for him.  He stated he would call [employer] to have them send this to his pshyc dr  Pt is aware that I'm shredding paperwork

## 2015-12-07 NOTE — Telephone Encounter (Signed)
Pt called back wanting me to fax paperwork to dr Evelene Croonkaur office  Phone # [phone]   I called dr Evelene Croonkaur office to get fax number [phone]  [facility]

## 2015-12-17 ENCOUNTER — Other Ambulatory Visit: Payer: Self-pay | Admitting: Family Medicine

## 2015-12-17 NOTE — Telephone Encounter (Signed)
Last office visit 11/217.  Last refilled 11/21/15 for #90 with no refillls.  Ok to refill?

## 2016-01-04 ENCOUNTER — Ambulatory Visit: Payer: Managed Care, Other (non HMO) | Admitting: Podiatry

## 2016-01-10 ENCOUNTER — Other Ambulatory Visit: Payer: Self-pay | Admitting: Family Medicine

## 2016-01-10 NOTE — Telephone Encounter (Signed)
Last office visit 11/23/2015.  Last refilled 12/17/2015 for #90 with no refills.  Ok to refill?

## 2016-01-11 MED ORDER — METHOCARBAMOL 500 MG PO TABS: 500.0000 mg | ORAL_TABLET | Freq: Three times a day (TID) | ORAL | 0 refills | Status: DC | PRN

## 2016-01-17 ENCOUNTER — Other Ambulatory Visit: Payer: Self-pay | Admitting: Family Medicine

## 2016-01-18 ENCOUNTER — Other Ambulatory Visit: Payer: Self-pay | Admitting: Family Medicine

## 2016-01-21 ENCOUNTER — Other Ambulatory Visit: Payer: Self-pay | Admitting: Family Medicine

## 2016-01-22 NOTE — Telephone Encounter (Signed)
Last office visit 11/23/15.  Last refilled 01/11/16 for #90 with no refills. Ok to refill?

## 2016-01-25 ENCOUNTER — Telehealth: Payer: Self-pay | Admitting: Family Medicine

## 2016-01-25 NOTE — Telephone Encounter (Signed)
Patient called to find out if Dr.Bedsole received forms for Long Term Disability from [employer]. If she did receive the forms, when will they be completed. If she didn't receive forms, please call patient so he can request them.

## 2016-01-26 NOTE — Telephone Encounter (Signed)
Jeffrey Lin notified that Dr. Ermalene SearingBedsole has not seen any forms from [employer].  He will call and have them refax them to us.

## 2016-01-26 NOTE — Telephone Encounter (Signed)
I have not seen forms.

## 2016-02-14 ENCOUNTER — Other Ambulatory Visit: Payer: Self-pay | Admitting: Family Medicine

## 2016-02-14 NOTE — Telephone Encounter (Signed)
Last office visit 11/23/2015.  Last refilled 01/23/2016 for #90.  Refill??

## 2016-02-15 MED ORDER — METHOCARBAMOL 500 MG PO TABS: 500.0000 mg | ORAL_TABLET | Freq: Three times a day (TID) | ORAL | 0 refills | Status: DC | PRN

## 2016-02-19 ENCOUNTER — Telehealth: Payer: Self-pay | Admitting: Family Medicine

## 2016-02-19 NOTE — Telephone Encounter (Signed)
[employer] faxed long term disability paperwork  Dr Ermalene SearingBedsole is this something you fill out or does pt physiatrist fill out Forms in dr Ermalene Searingbedsole in box

## 2016-02-20 NOTE — Telephone Encounter (Signed)
Psychiatrist

## 2016-02-22 NOTE — Telephone Encounter (Signed)
Spoke to pt  He stated he would call psychiatrist and [employer] He is aware dr Ermalene Searingbedsole wanted psychiatrist to do paperwork

## 2016-02-27 ENCOUNTER — Telehealth: Payer: Self-pay | Admitting: Family Medicine

## 2016-02-27 NOTE — Telephone Encounter (Signed)
Pt has appt to see Dr Dayton MartesAron on 02/28/16 at 11:45.

## 2016-02-27 NOTE — Telephone Encounter (Signed)
[facility] - Client TELEPHONE ADVICE RECORD [facility] Patient Name: Jeffrey Lin DOB: 05/16/1975 Initial Comment caller states his ankles are swollen Nurse Assessment Nurse: Reed Pandyamsey, RN, Amy Date/Time [name](Eastern Time): 02/27/2016 2:17:59 PM Confirm and document reason for call. If symptomatic, describe symptoms. ---Caller states his ankles on the inside are swollen and pain in his right knee 5/10. Calf muscles on both legs "feel tight" but there is no pain. Symptoms started last week. Has kept his feet elevated but says it did not help the swelling go down. No redness on the legs. No fever. He says he has had surgeries in the past and has had issues with swelling and pain before. No other symptoms. Does the patient have any new or worsening symptoms? ---Yes Will a triage be completed? ---Yes Related visit to physician within the last 2 weeks? ---No Does the PT have any chronic conditions? (i.e. diabetes, asthma, etc.) ---No Is this a behavioral health or substance abuse call? ---No Guidelines Guideline Title Affirmed Question Affirmed Notes Leg Swelling and Edema [1] MILD swelling of both ankles (i.e., pedal edema) AND [2] new onset or worsening Final Disposition User See PCP When Office is Open (within 3 days) Reed Pandyamsey, RN, Amy Referrals REFERRED TO PCP OFFICE Disagree/Comply: Comply

## 2016-02-28 ENCOUNTER — Ambulatory Visit: Payer: Self-pay | Admitting: Family Medicine

## 2016-02-29 ENCOUNTER — Ambulatory Visit (INDEPENDENT_AMBULATORY_CARE_PROVIDER_SITE_OTHER): Payer: PRIVATE HEALTH INSURANCE | Admitting: Family Medicine

## 2016-02-29 ENCOUNTER — Encounter: Payer: Self-pay | Admitting: Family Medicine

## 2016-02-29 VITALS — BP 150/88 | HR 86 | Temp 97.5°F | Ht 76.0 in | Wt 259.2 lb

## 2016-02-29 DIAGNOSIS — I10 Essential (primary) hypertension: Secondary | ICD-10-CM | POA: Diagnosis not present

## 2016-02-29 DIAGNOSIS — R609 Edema, unspecified: Secondary | ICD-10-CM | POA: Diagnosis not present

## 2016-02-29 LAB — CBC WITH DIFFERENTIAL/PLATELET
BASOS ABS: 0 10*3/uL (ref 0.0–0.1)
Basophils Relative: 0.4 % (ref 0.0–3.0)
EOS ABS: 0.2 10*3/uL (ref 0.0–0.7)
Eosinophils Relative: 1.6 % (ref 0.0–5.0)
HCT: 45.3 % (ref 39.0–52.0)
Hemoglobin: 15.4 g/dL (ref 13.0–17.0)
LYMPHS ABS: 3.5 10*3/uL (ref 0.7–4.0)
Lymphocytes Relative: 34.9 % (ref 12.0–46.0)
MCHC: 34 g/dL (ref 30.0–36.0)
MCV: 89.5 fl (ref 78.0–100.0)
Monocytes Absolute: 0.5 10*3/uL (ref 0.1–1.0)
Monocytes Relative: 5.2 % (ref 3.0–12.0)
NEUTROS ABS: 5.8 10*3/uL (ref 1.4–7.7)
NEUTROS PCT: 57.9 % (ref 43.0–77.0)
PLATELETS: 254 10*3/uL (ref 150.0–400.0)
RBC: 5.06 Mil/uL (ref 4.22–5.81)
RDW: 14.4 % (ref 11.5–15.5)
WBC: 10 10*3/uL (ref 4.0–10.5)

## 2016-02-29 LAB — TSH: TSH: 0.61 u[IU]/mL (ref 0.35–4.50)

## 2016-02-29 LAB — BRAIN NATRIURETIC PEPTIDE: Pro B Natriuretic peptide (BNP): 17 pg/mL (ref 0.0–100.0)

## 2016-02-29 LAB — COMPREHENSIVE METABOLIC PANEL
ALT: 69 U/L — AB (ref 0–53)
AST: 35 U/L (ref 0–37)
Albumin: 4.2 g/dL (ref 3.5–5.2)
Alkaline Phosphatase: 105 U/L (ref 39–117)
BILIRUBIN TOTAL: 0.5 mg/dL (ref 0.2–1.2)
BUN: 5 mg/dL — ABNORMAL LOW (ref 6–23)
CO2: 37 meq/L — AB (ref 19–32)
CREATININE: 1.15 mg/dL (ref 0.40–1.50)
Calcium: 9.5 mg/dL (ref 8.4–10.5)
Chloride: 103 mEq/L (ref 96–112)
GFR: 74.95 mL/min (ref >60.00)
GLUCOSE: 100 mg/dL — AB (ref 70–99)
Potassium: 4 mEq/L (ref 3.5–5.1)
SODIUM: 141 meq/L (ref 135–145)
Total Protein: 7.1 g/dL (ref 6.0–8.3)

## 2016-02-29 NOTE — Progress Notes (Signed)
Pre visit review using our clinic review tool, if applicable. No additional management support is needed unless otherwise documented below in the visit note. 

## 2016-02-29 NOTE — Assessment & Plan Note (Signed)
Has not  Taken BP meds this AM.  Follow BPs at home daily.  Encouraged exercise, weight loss, healthy eating habits.

## 2016-02-29 NOTE — Assessment & Plan Note (Addendum)
No mew med change.  No clear cardiac change and recent EKGs stable. Eval with labs.  May be from weight gain from psychotropic meds/treatments.  If labs neg..  Consider increase in diuretic.  Consider  ECHO to eval heart function.

## 2016-02-29 NOTE — Progress Notes (Signed)
   Subjective:    Patient ID: Jeffrey Lin, male    DOB: 01/28/1975, 41 y.o.   MRN: [id number]  HPI   41 year old male pt with history of severe depression and schizoaffective disorder, HTN,  And history of DVT presents with new onset swelling in bilateral legs and ankles.   He first noted swelling in last week.  Pain with putting weight on right leg, tender at ankle.  No known injury.  No chest pain , no SOB. Normal energy levels.  No hair loss, no cold intolerance. No new meds.  On losartan HCTZ 50/12.5 mg daily.. Did not take today. He has had 20 lb weight gain in last 6 months,  No change in diet. Wt Readings from Last 3 Encounters:  02/29/16 259 lb 4 oz (117.6 kg)  11/23/15 243 lb 12 oz (110.6 kg)  11/07/15 238 lb 8 oz (108.2 kg)   BP Readings from Last 3 Encounters:  02/29/16 (!) 150/88  11/23/15 112/86  11/07/15 (!) 120/92   Nml EKG 09/2015... nml EKG prior to every ECT treatment.  Smoker, no ETOH use. Staying active.  Review of Systems  Constitutional: Negative for fatigue and fever.  HENT: Negative for ear pain.   Eyes: Negative for pain.  Respiratory: Negative for cough and shortness of breath.   Cardiovascular: Positive for leg swelling. Negative for chest pain and palpitations.  Gastrointestinal: Positive for abdominal distention. Negative for abdominal pain.       Objective:   Physical Exam  Constitutional: Vital signs are normal. He appears well-developed and well-nourished.  HENT:  Head: Normocephalic.  Right Ear: Hearing normal.  Left Ear: Hearing normal.  Nose: Nose normal.  Mouth/Throat: Oropharynx is clear and moist and mucous membranes are normal.  Neck: Trachea normal. Carotid bruit is not present. No thyroid mass and no thyromegaly present.  Cardiovascular: Normal rate, regular rhythm and normal pulses.  Exam reveals no gallop, no distant heart sounds and no friction rub.   No murmur heard. Bilateral 1 plus pitting peripheral edema Equal  no focal ttp in legs.. Sore around right ankle but puts more weight on that leg given past left knee injury.  Pulmonary/Chest: Effort normal and breath sounds normal. No respiratory distress.  Skin: Skin is warm, dry and intact. No rash noted.  Psychiatric: He has a normal mood and affect. His speech is normal and behavior is normal. Thought content normal.          Assessment & Plan:

## 2016-02-29 NOTE — Patient Instructions (Signed)
Stop at lab on way out.  Take BP med today ASAP.  Follow BP at home.. Call in next week with results. When sitting, elevate legs above your heart.  Walk or exercise as much as you can.

## 2016-02-29 NOTE — Addendum Note (Signed)
Addended by: Kerby NoraBEDSOLE, Moe Graca E on: 02/29/2016 08:50 AM   Modules accepted: Orders

## 2016-03-07 ENCOUNTER — Telehealth: Payer: Self-pay

## 2016-03-07 NOTE — Telephone Encounter (Signed)
Pt was seen 02/29/16; pt has been taking BP med recently and has not missed a pill since seen 02/29/16.  03/04/16 10 AM BP 119/88   1 PM BP 129/94 03/05/16 7:30 AM  BP 144/96   12:30 PM  151/92 03/06/16 6:15 AM   BP 135/91   1:00 PM BP 132/89 03/07/16  BP 141/103.   [facility]. Pt request cb after Dr Ermalene SearingBedsole reviews BP.

## 2016-03-07 NOTE — Addendum Note (Signed)
Addended by: Damita LackLORING, DONNA S on: 03/07/2016 02:35 PM   Modules accepted: Orders

## 2016-03-07 NOTE — Telephone Encounter (Signed)
Blood pressure remain too high.. If no SE to losartan HCTZ increase to 2 tabs daily. Change in med list.  Continue to follow BPs and have pt follow up in 2 weeks.

## 2016-03-07 NOTE — Telephone Encounter (Signed)
Gertrude notified as instructed by telephone.  Follow up appointment scheduled with Dr. Ermalene SearingBedsole on 03/22/16 at 3:15 pm.  Medication list updated.

## 2016-03-11 ENCOUNTER — Other Ambulatory Visit: Payer: Self-pay | Admitting: Family Medicine

## 2016-03-11 MED ORDER — METHOCARBAMOL 500 MG PO TABS: 500.0000 mg | ORAL_TABLET | Freq: Three times a day (TID) | ORAL | 0 refills | Status: DC | PRN

## 2016-03-11 NOTE — Telephone Encounter (Signed)
Last office visit 02/29/2016.  Last refilled 02/15/2016 for #90 with no refills.  Ok to refill?

## 2016-03-18 ENCOUNTER — Ambulatory Visit (INDEPENDENT_AMBULATORY_CARE_PROVIDER_SITE_OTHER): Payer: PRIVATE HEALTH INSURANCE | Admitting: Internal Medicine

## 2016-03-18 ENCOUNTER — Encounter: Payer: Self-pay | Admitting: Internal Medicine

## 2016-03-18 ENCOUNTER — Other Ambulatory Visit: Payer: Self-pay | Admitting: Internal Medicine

## 2016-03-18 VITALS — BP 146/90 | HR 107 | Temp 97.6°F | Wt 272.0 lb

## 2016-03-18 DIAGNOSIS — R0602 Shortness of breath: Secondary | ICD-10-CM | POA: Diagnosis not present

## 2016-03-18 DIAGNOSIS — R635 Abnormal weight gain: Secondary | ICD-10-CM | POA: Diagnosis not present

## 2016-03-18 DIAGNOSIS — R609 Edema, unspecified: Secondary | ICD-10-CM | POA: Diagnosis not present

## 2016-03-18 LAB — POC URINALSYSI DIPSTICK (AUTOMATED)
Bilirubin, UA: NEGATIVE
Blood, UA: NEGATIVE
GLUCOSE UA: NEGATIVE
Ketones, UA: NEGATIVE
LEUKOCYTES UA: NEGATIVE
NITRITE UA: NEGATIVE
Protein, UA: NEGATIVE
SPEC GRAV UA: 1.015
UROBILINOGEN UA: NEGATIVE
pH, UA: 6

## 2016-03-18 LAB — COMPREHENSIVE METABOLIC PANEL
ALBUMIN: 4.4 g/dL (ref 3.5–5.2)
ALK PHOS: 141 U/L — AB (ref 39–117)
ALT: 40 U/L (ref 0–53)
AST: 24 U/L (ref 0–37)
BILIRUBIN TOTAL: 0.4 mg/dL (ref 0.2–1.2)
BUN: 11 mg/dL (ref 6–23)
CO2: 35 mEq/L — ABNORMAL HIGH (ref 19–32)
Calcium: 9.2 mg/dL (ref 8.4–10.5)
Chloride: 98 mEq/L (ref 96–112)
Creatinine, Ser: 1.07 mg/dL (ref 0.40–1.50)
GFR: 81.43 mL/min (ref >60.00)
Glucose, Bld: 81 mg/dL (ref 70–99)
POTASSIUM: 3.1 meq/L — AB (ref 3.5–5.1)
SODIUM: 139 meq/L (ref 135–145)
TOTAL PROTEIN: 7.2 g/dL (ref 6.0–8.3)

## 2016-03-18 LAB — LIPID PANEL
CHOLESTEROL: 191 mg/dL (ref 0–200)
HDL: 35.6 mg/dL — ABNORMAL LOW (ref >39.00)
NonHDL: 155.45
Total CHOL/HDL Ratio: 5
Triglycerides: 363 mg/dL — ABNORMAL HIGH (ref 0.0–149.0)
VLDL: 72.6 mg/dL — ABNORMAL HIGH (ref 0.0–40.0)

## 2016-03-18 LAB — LDL CHOLESTEROL, DIRECT: LDL DIRECT: 101 mg/dL

## 2016-03-18 MED ORDER — POTASSIUM CHLORIDE ER 10 MEQ PO TBCR: 10.0000 meq | EXTENDED_RELEASE_TABLET | Freq: Every day | ORAL | 0 refills | Status: DC

## 2016-03-18 MED ORDER — FUROSEMIDE 20 MG PO TABS: 20.0000 mg | ORAL_TABLET | Freq: Every day | ORAL | 0 refills | Status: DC

## 2016-03-18 NOTE — Addendum Note (Signed)
Addended by: Roena MaladyEVONTENNO, Dennie Moltz Y on: 03/18/2016 04:11 PM   Modules accepted: Orders

## 2016-03-18 NOTE — Progress Notes (Signed)
Subjective:    Patient ID: Jeffrey Lin, male    DOB: 03/24/1975, 41 y.o.   MRN: [id number]  HPI  Pt presents to the clinic today with c/o swelling in his abdomen and arms. He noticed this about 1 week ago. He is no longer able to wear his watch. His stomach is much bigger than it was even 3 weeks ago. He saw PCP 02/29/2016, with new onset swelling in the legs and ankles. Dr. Ermalene SearingBedsole felt like this may be weight gain associated with antipsychotics and not necessarily edema. Labs were normal. She encouraged elevation. She also advised him to call back in a few days with his BP readings to see if she needed to go up on his antihypertensive, or just the diuretic. He called back a few days later with elevated BP readings. She advised him to increase his BP medication to 100-25 mg daily. He never started taking 2 tablets as directed. His BP today is 146/90. He denies recent changes in diet, medication or activity level. He has gained 33 lbs since 10/2015, 13 of which were in the last 3 weeks.  Review of Systems      Past Medical History:  Diagnosis Date  . Anxiety   . Chronic leg pain   . Depression   . Essential hypertension, benign 10/20/2015  . Schizoaffective disorder (HCC)     Current Outpatient Prescriptions  Medication Sig Dispense Refill  . amantadine (SYMMETREL) 100 MG capsule Take 100 mg by mouth 2 (two) times daily.  5  . atorvastatin (LIPITOR) 40 MG tablet TAKE 1 TABLET (40 MG TOTAL) BY MOUTH DAILY. 90 tablet 3  . benztropine (COGENTIN) 1 MG tablet Take 1 mg by mouth at bedtime.     . clonazePAM (KLONOPIN) 1 MG tablet Take 1 mg by mouth daily.   3  . diazepam (VALIUM) 10 MG tablet Take 10 mg by mouth 4 (four) times daily.     . diclofenac (VOLTAREN) 75 MG EC tablet TAKE 1 TABLET (75 MG TOTAL) BY MOUTH 2 (TWO) TIMES DAILY. WITH FOOD. 60 tablet 2  . FLUoxetine (PROZAC) 20 MG capsule Take 20 mg by mouth daily.  3  . gabapentin (NEURONTIN) 600 MG tablet Take 1,200-1,800 mg by mouth  See admin instructions. Take either two tablets (1200mg ) three times a day or three tablets (1800mg ) two times a day.    . hydrOXYzine (VISTARIL) 25 MG capsule TAKE 1 CAPSULE BY MOUTH 4 TIMES A DAY  5  . losartan-hydrochlorothiazide (HYZAAR) 50-12.5 MG tablet Take 2 tablets by mouth daily.    . methocarbamol (ROBAXIN) 500 MG tablet Take 1 tablet (500 mg total) by mouth every 8 (eight) hours as needed for muscle spasms. 90 tablet 0  . nystatin (MYCOSTATIN) 100000 UNIT/ML suspension Take 5 mLs (500,000 Units total) by mouth 4 (four) times daily. 60 mL 1  . OLANZapine (ZYPREXA) 10 MG tablet TAKE 1 TABLET BY MOUTH AT BEDTIME (REPLACES SYMBYAX)  3  . triamcinolone cream (KENALOG) 0.1 % Apply 1 application topically 2 (two) times daily. 30 g 0  . Vitamin D, Ergocalciferol, (DRISDOL) 50000 units CAPS capsule Take 1 capsule (50,000 Units total) by mouth every 7 (seven) days. (Patient taking differently: Take 50,000 Units by mouth 2 (two) times a week. ) 12 capsule 0   No current facility-administered medications for this visit.     Allergies  Allergen Reactions  . Toradol [Ketorolac Tromethamine] Swelling  . Lamotrigine Rash    Family History  Problem Relation Age of Onset  . Hyperlipidemia Mother   . Hypertension Mother   . Depression Mother   . Anxiety disorder Mother   . Depression Brother   . Anxiety disorder Brother   . Bipolar disorder Maternal Grandfather   . Schizophrenia Maternal Grandfather   . Bipolar disorder Paternal Grandfather     Social History   Social History  . Marital status: Single    Spouse name: N/A  . Number of children: 0  . Years of education: N/A   Occupational History  . [employer]   Social History Main Topics  . Smoking status: Current Every Day Smoker    Packs/day: 0.50    Years: 10.00    Types: Cigarettes    Start date: 09/22/1995  . Smokeless tobacco: Former Neurosurgeon  . Alcohol use No  . Drug use: No  . Sexual  activity: Not Currently   Other Topics Concern  . Not on file   Social History Narrative   In relationship, + sex, + condom   Exercise:  None, running causes head pain   Diet:  FF once daily, + fruits and veggies, + H2O, + Soda     Constitutional: Denies fever, malaise, fatigue, headache or abrupt weight changes.  Respiratory: Pt reports mild shortness of breath (smoker). Denies difficulty breathing, shortness of breath, cough or sputum production.   Cardiovascular: Pt reports swelling from feet up to arms. Denies chest pain, chest tightness, palpitations.  Gastrointestinal: Pt reports abdominal bloating. Denies abdominal pain, constipation, diarrhea or blood in the stool.    No other specific complaints in a complete review of systems (except as listed in HPI above).  Objective:   Physical Exam   BP (!) 146/90   Pulse (!) 107   Temp 97.6 F (36.4 C) (Oral)   Wt 272 lb (123.4 kg)   SpO2 97%   BMI 33.11 kg/m  Wt Readings from Last 3 Encounters:  03/18/16 272 lb (123.4 kg)  02/29/16 259 lb 4 oz (117.6 kg)  11/23/15 243 lb 12 oz (110.6 kg)    General: Appears his stated age, in NAD. Cardiovascular: Normal rate and rhythm. S1,S2 noted.  No murmur, rubs or gallops noted. He has 1+ pitting edema from his feet up to his knees. He has 1+ nonpitting edema of thighs, abdomen, hands and forearm. Pulmonary/Chest: Normal effort and positive vesicular breath sounds. No respiratory distress. No wheezes, rales or ronchi noted.  Abdomen: Soft and nontender. Normal bowel sounds. No distention or masses noted. Liver, spleen and kidneys non palpable.   BMET    Component Value Date/Time   NA 141 02/29/2016 0906   NA 145 09/11/2013 0406   K 4.0 02/29/2016 0906   K 3.6 09/11/2013 0406   CL 103 02/29/2016 0906   CL 110 (H) 09/11/2013 0406   CO2 37 (H) 02/29/2016 0906   CO2 26 09/11/2013 0406   GLUCOSE 100 (H) 02/29/2016 0906   GLUCOSE 81 09/11/2013 0406   BUN 5 (L) 02/29/2016 0906    BUN 19 (H) 09/11/2013 0406   CREATININE 1.15 02/29/2016 0906   CREATININE 1.05 09/11/2013 0406   CALCIUM 9.5 02/29/2016 0906   CALCIUM 8.0 (L) 09/11/2013 0406   GFRNONAA >60 09/29/2015 0841   GFRNONAA >60 09/11/2013 0406   GFRAA >60 09/29/2015 0841   GFRAA >60 09/11/2013 0406    Lipid Panel     Component Value Date/Time   CHOL 145 11/01/2015 1119  TRIG 162.0 (H) 11/01/2015 1119   HDL 28.70 (L) 11/01/2015 1119   CHOLHDL 5 11/01/2015 1119   VLDL 32.4 11/01/2015 1119   LDLCALC 84 11/01/2015 1119    CBC    Component Value Date/Time   WBC 10.0 02/29/2016 0906   RBC 5.06 02/29/2016 0906   HGB 15.4 02/29/2016 0906   HGB 13.1 09/11/2013 0406   HCT 45.3 02/29/2016 0906   HCT 39.2 (L) 09/11/2013 0406   PLT 254.0 02/29/2016 0906   PLT 171 09/11/2013 0406   MCV 89.5 02/29/2016 0906   MCV 92 09/11/2013 0406   MCH 33.3 09/29/2015 0841   MCHC 34.0 02/29/2016 0906   RDW 14.4 02/29/2016 0906   RDW 12.8 09/11/2013 0406   LYMPHSABS 3.5 02/29/2016 0906   LYMPHSABS 3.3 09/11/2013 0406   MONOABS 0.5 02/29/2016 0906   MONOABS 0.4 09/11/2013 0406   EOSABS 0.2 02/29/2016 0906   EOSABS 0.1 09/11/2013 0406   BASOSABS 0.0 02/29/2016 0906   BASOSABS 0.0 09/11/2013 0406    Hgb A1C Lab Results  Component Value Date   HGBA1C 5.9 11/01/2015           Assessment & Plan:   Edema, weight gain, shortness of breath:  Concern for early nephrotic syndrome vs heart failure Urinalysis: no protein Will check CMET and Lipid profile today Stat 2D echo ordered eRx for Lasix 20 mg daily x 5 days  Will follow up after labs/imaging are back To ER if you develop worsening shortness of breath, chest pain  BAITY, REGINA, NP

## 2016-03-18 NOTE — Patient Instructions (Signed)
Edema  Edema is an abnormal buildup of fluids. It is more common in your legs and thighs. Painless swelling of the feet and ankles is more likely as a person ages. It also is common in looser skin, like around your eyes.  Follow these instructions at home:  ? Keep the affected body part above the level of the heart while lying down.  ? Do not sit still or stand for a long time.  ? Do not put anything right under your knees when you lie down.  ? Do not wear tight clothes on your upper legs.  ? Exercise your legs to help the puffiness (swelling) go down.  ? Wear elastic bandages or support stockings as told by your doctor.  ? A low-salt diet may help lessen the puffiness.  ? Only take medicine as told by your doctor.  Contact a doctor if:  ? Treatment is not working.  ? You have heart, liver, or kidney disease and notice that your skin looks puffy or shiny.  ? You have puffiness in your legs that does not get better when you raise your legs.  ? You have sudden weight gain for no reason.  Get help right away if:  ? You have shortness of breath or chest pain.  ? You cannot breathe when you lie down.  ? You have pain, redness, or warmth in the areas that are puffy.  ? You have heart, liver, or kidney disease and get edema all of a sudden.  ? You have a fever and your symptoms get worse all of a sudden.  This information is not intended to replace advice given to you by your health care provider. Make sure you discuss any questions you have with your health care provider.  Document Released: 06/26/2007 Document Revised: 06/15/2015 Document Reviewed: 10/30/2012  Elsevier Interactive Patient Education ? 2017 Elsevier Inc.

## 2016-03-19 ENCOUNTER — Other Ambulatory Visit: Payer: Self-pay

## 2016-03-19 ENCOUNTER — Ambulatory Visit (INDEPENDENT_AMBULATORY_CARE_PROVIDER_SITE_OTHER): Payer: PRIVATE HEALTH INSURANCE

## 2016-03-19 DIAGNOSIS — R609 Edema, unspecified: Secondary | ICD-10-CM | POA: Diagnosis not present

## 2016-03-20 ENCOUNTER — Telehealth: Payer: Self-pay

## 2016-03-20 LAB — ECHOCARDIOGRAM COMPLETE
Ao-asc: 31 cm
CHL CUP DOP CALC LVOT VTI: 16.9 cm
EWDT: 246 ms
FS: 32 % (ref 28–44)
IVS/LV PW RATIO, ED: 0.93
LA ID, A-P, ES: 34 mm
LA vol: 39.7 mL
LADIAMINDEX: 1.35 cm/m2
LAVOLA4C: 41.3 mL
LAVOLIN: 15.8 mL/m2
LEFT ATRIUM END SYS DIAM: 34 mm
LVOT SV: 70 mL
LVOT area: 4.15 cm2
LVOT diameter: 23 mm
Lateral S' vel: 9.57 cm/s
MV Dec: 246
MV pk E vel: 48.3 m/s
MVAP: 3.06 cm2
MVPKAVEL: 54.8 m/s
P 1/2 time: 72 ms
PW: 14 mm — AB (ref 0.6–1.1)
TAPSE: 20.8 mm

## 2016-03-20 NOTE — Telephone Encounter (Signed)
Pt got furosemide from [facility] but K not there; [facility] and pt will call [facility] and have K [facility]. Pt voiced understanding.

## 2016-03-22 ENCOUNTER — Ambulatory Visit (INDEPENDENT_AMBULATORY_CARE_PROVIDER_SITE_OTHER): Payer: PRIVATE HEALTH INSURANCE | Admitting: Family Medicine

## 2016-03-22 ENCOUNTER — Encounter: Payer: Self-pay | Admitting: Family Medicine

## 2016-03-22 ENCOUNTER — Telehealth: Payer: Self-pay | Admitting: Family Medicine

## 2016-03-22 VITALS — BP 97/69 | HR 111 | Temp 97.7°F | Ht 76.0 in | Wt 270.5 lb

## 2016-03-22 DIAGNOSIS — R609 Edema, unspecified: Secondary | ICD-10-CM

## 2016-03-22 DIAGNOSIS — E876 Hypokalemia: Secondary | ICD-10-CM

## 2016-03-22 DIAGNOSIS — I10 Essential (primary) hypertension: Secondary | ICD-10-CM | POA: Diagnosis not present

## 2016-03-22 LAB — BASIC METABOLIC PANEL
BUN: 8 mg/dL (ref 6–23)
CHLORIDE: 98 meq/L (ref 96–112)
CO2: 36 mEq/L — ABNORMAL HIGH (ref 19–32)
Calcium: 9.4 mg/dL (ref 8.4–10.5)
Creatinine, Ser: 1.08 mg/dL (ref 0.40–1.50)
GFR: 80.56 mL/min (ref >60.00)
Glucose, Bld: 95 mg/dL (ref 70–99)
POTASSIUM: 3.6 meq/L (ref 3.5–5.1)
SODIUM: 138 meq/L (ref 135–145)

## 2016-03-22 MED ORDER — POTASSIUM CHLORIDE ER 10 MEQ PO TBCR: 10.0000 meq | EXTENDED_RELEASE_TABLET | Freq: Every day | ORAL | 0 refills | Status: DC

## 2016-03-22 MED ORDER — FUROSEMIDE 20 MG PO TABS: 20.0000 mg | ORAL_TABLET | Freq: Every day | ORAL | 0 refills | Status: DC

## 2016-03-22 NOTE — Patient Instructions (Addendum)
Stop at lab on way out.  Continue lasix 20 mg daily for next 5 days.  Take potassium along with the  Lasix when you take it.. I will send in prescription  Once labs return.  Follow BP at home.

## 2016-03-22 NOTE — Telephone Encounter (Signed)
See result note.  

## 2016-03-22 NOTE — Assessment & Plan Note (Signed)
Elevated at home and low here. Conitnue current med dose while diuresing.

## 2016-03-22 NOTE — Assessment & Plan Note (Signed)
Check potassium after lasix use, never filled potassium rx. Will likely need supplementation.

## 2016-03-22 NOTE — Progress Notes (Signed)
   Subjective:    Patient ID: Jeffrey Lin, male    DOB: 02/10/1975, 41 y.o.   MRN: [id number]  HPI    41 year old male presents for follow up HTN and peripheral edema.  [facility]  UA: no protein.  no new kidney issue, no new liver issues  2 ECHO: EF 55-60% Started lasix 20 mg x 5 days. Finished yesterday. He had resulting increase in urination. Improved abd swelling, but still some swelling in legs. BP is much lower now with diuresis.   He never increased  Losartan HCTZ to 2 tabs daily.  He has not started potassium pill yet.  BP Readings from Last 3 Encounters:  03/22/16 97/69  03/18/16 (!) 146/90  02/29/16 (!) 150/88    Wt Readings from Last 3 Encounters:  03/22/16 270 lb 8 oz (122.7 kg)  03/18/16 272 lb (123.4 kg)  02/29/16 259 lb 4 oz (117.6 kg)    Today he reports BP at home 140/90s    Review of Systems  Constitutional: Negative for fatigue.  HENT: Negative for ear pain.   Eyes: Negative for pain.  Respiratory: Negative for shortness of breath and wheezing.   Cardiovascular: Positive for leg swelling. Negative for chest pain and palpitations.  Gastrointestinal: Negative for abdominal distention.       Objective:   Physical Exam  Constitutional: Vital signs are normal. He appears well-developed and well-nourished.  Central obesity  HENT:  Head: Normocephalic.  Right Ear: Hearing normal.  Left Ear: Hearing normal.  Nose: Nose normal.  Mouth/Throat: Oropharynx is clear and moist and mucous membranes are normal.  Neck: Trachea normal. Carotid bruit is not present. No thyroid mass and no thyromegaly present.  Cardiovascular: Normal rate, regular rhythm and normal pulses.  Exam reveals no gallop, no distant heart sounds and no friction rub.   No murmur heard. 1 plus bilateral  peripheral edema up to mid calf  Pulmonary/Chest: Effort normal and breath sounds normal. No respiratory distress.  Skin: Skin is warm, dry and intact. No  rash noted.  Psychiatric: He has a normal mood and affect. His speech is normal and behavior is normal. Thought content normal.          Assessment & Plan:

## 2016-03-22 NOTE — Progress Notes (Signed)
Pre visit review using our clinic review tool, if applicable. No additional management support is needed unless otherwise documented below in the visit note. 

## 2016-03-22 NOTE — Assessment & Plan Note (Addendum)
Nml ECHO.Marland Kitchen. No CHF, nml CMET, no liver and kidney issues, nml thyroid. Possible med SE.  Continue course of lasix for 5 more days. Eval kidney function on diuresis today.

## 2016-03-25 ENCOUNTER — Other Ambulatory Visit: Payer: Self-pay | Admitting: Family Medicine

## 2016-03-25 ENCOUNTER — Telehealth: Payer: Self-pay

## 2016-03-25 NOTE — Telephone Encounter (Signed)
Pt request status of refill furosemide; pt will ck with pharmacy about refill on 03/26/16.

## 2016-03-25 NOTE — Telephone Encounter (Signed)
Refill?  Patient requested through MyChart.

## 2016-03-25 NOTE — Telephone Encounter (Signed)
[employer] CALLED TO SPEAK WITH YOU ABOUT DISABILITY PAPERWORK ON PATIENT.  THEY NEED TO SPEAK WITH YOU [phone] OR  [phone]  REF # [id number]  FAX # [phone] FOR PAPERWORK.

## 2016-03-25 NOTE — Telephone Encounter (Signed)
Pt wants K sent to [facility]; [facility]

## 2016-03-26 MED ORDER — FUROSEMIDE 20 MG PO TABS: 20.0000 mg | ORAL_TABLET | Freq: Every day | ORAL | 0 refills | Status: DC

## 2016-03-26 NOTE — Telephone Encounter (Signed)
Pt called to verify furosemide was sent to [facility]. Advised pt already done. Pt will ck with pharmacy.

## 2016-03-29 ENCOUNTER — Encounter: Payer: Self-pay | Admitting: Family Medicine

## 2016-03-29 ENCOUNTER — Ambulatory Visit (INDEPENDENT_AMBULATORY_CARE_PROVIDER_SITE_OTHER): Payer: PRIVATE HEALTH INSURANCE | Admitting: Family Medicine

## 2016-03-29 VITALS — BP 119/90 | HR 113 | Temp 97.4°F | Ht 76.0 in | Wt 275.0 lb

## 2016-03-29 DIAGNOSIS — I10 Essential (primary) hypertension: Secondary | ICD-10-CM | POA: Diagnosis not present

## 2016-03-29 DIAGNOSIS — R609 Edema, unspecified: Secondary | ICD-10-CM

## 2016-03-29 DIAGNOSIS — F251 Schizoaffective disorder, depressive type: Secondary | ICD-10-CM

## 2016-03-29 DIAGNOSIS — E876 Hypokalemia: Secondary | ICD-10-CM | POA: Diagnosis not present

## 2016-03-29 LAB — BASIC METABOLIC PANEL
BUN: 13 mg/dL (ref 6–23)
CHLORIDE: 99 meq/L (ref 96–112)
CO2: 35 mEq/L — ABNORMAL HIGH (ref 19–32)
Calcium: 9.9 mg/dL (ref 8.4–10.5)
Creatinine, Ser: 1.16 mg/dL (ref 0.40–1.50)
GFR: 74.18 mL/min (ref >60.00)
Glucose, Bld: 89 mg/dL (ref 70–99)
POTASSIUM: 3.4 meq/L — AB (ref 3.5–5.1)
SODIUM: 140 meq/L (ref 135–145)

## 2016-03-29 MED ORDER — FUROSEMIDE 20 MG PO TABS: 20.0000 mg | ORAL_TABLET | Freq: Every day | ORAL | 1 refills | Status: DC

## 2016-03-29 MED ORDER — LOSARTAN POTASSIUM 100 MG PO TABS: 100.0000 mg | ORAL_TABLET | Freq: Every day | ORAL | 11 refills | Status: DC

## 2016-03-29 MED ORDER — POTASSIUM CHLORIDE ER 10 MEQ PO TBCR: 10.0000 meq | EXTENDED_RELEASE_TABLET | Freq: Every day | ORAL | 1 refills | Status: DC

## 2016-03-29 NOTE — Progress Notes (Signed)
Pre visit review using our clinic review tool, if applicable. No additional management support is needed unless otherwise documented below in the visit note. 

## 2016-03-29 NOTE — Patient Instructions (Addendum)
Stop losartan HCTZ.  Start losartan at higher dose 40 mg   Follow BP at home.  Continue lasix daily, take a potassium with it. Stop at lab on way out.

## 2016-03-29 NOTE — Assessment & Plan Note (Signed)
Re-eval given now on lasix longer. Also taking potassium

## 2016-03-29 NOTE — Assessment & Plan Note (Signed)
Concern about med SE. I will contact Dr. Evelene CroonKaur pt's psychiatrist to discuss. Continue lasix daily.

## 2016-03-29 NOTE — Assessment & Plan Note (Signed)
Elevated diastolic.Marland Kitchen. Stop HCTZ and increase losartan to 40 mg daily.

## 2016-03-29 NOTE — Progress Notes (Signed)
   Subjective:    Patient ID: Jeffrey Lin, male    DOB: 02/17/1975, 41 y.o.   MRN: [id number]  HPI  41 year old male presents with recent worsening of HTN and peripheral edema. He has completed 1 more week of lasix 20 mg daily. Along with potasssium No clear secondary cause. Possible due to SE of olanzapine ( 3% SE) amantadine (listed as 16% chance of SE)  UA: no protein  CMET: no liver of kidney issues. ECHO: nml EF 03/19/2016  TSH nml  He continues to take losartan HCTZ.  BP Readings from Last 3 Encounters:  03/29/16 119/90  03/22/16 97/69  03/18/16 (!) 146/90    Wt Readings from Last 3 Encounters:  03/29/16 275 lb (124.7 kg)  03/22/16 270 lb 8 oz (122.7 kg)  03/18/16 272 lb (123.4 kg)   He reports he has had continue decrease of swelling in leg. His weight has increased. BP at home 117-152/84-101   Review of Systems  Constitutional: Positive for fatigue.  HENT: Negative for ear pain.   Eyes: Negative for pain.  Respiratory: Negative for shortness of breath.   Cardiovascular: Negative for chest pain.  Gastrointestinal: Negative for abdominal distention.       Objective:   Physical Exam  Constitutional: Vital signs are normal. He appears well-developed and well-nourished.  HENT:  Head: Normocephalic.  Right Ear: Hearing normal.  Left Ear: Hearing normal.  Nose: Nose normal.  Mouth/Throat: Oropharynx is clear and moist and mucous membranes are normal.  Neck: Trachea normal. Carotid bruit is not present. No thyroid mass and no thyromegaly present.  Cardiovascular: Normal rate, regular rhythm and normal pulses.  Exam reveals no gallop, no distant heart sounds and no friction rub.   No murmur heard. 1 plus pitting edema bilaterally   Pulmonary/Chest: Effort normal and breath sounds normal. No respiratory distress.  Skin: Skin is warm, dry and intact. No rash noted.  Psychiatric: He has a normal mood and affect. His speech is normal and behavior is normal.  Thought content normal.          Assessment & Plan:

## 2016-03-29 NOTE — Assessment & Plan Note (Signed)
?   If med SE causing peripheral swelling. May be able to stop or change olanzapine or amantadine.

## 2016-04-02 ENCOUNTER — Other Ambulatory Visit: Payer: Self-pay | Admitting: Family Medicine

## 2016-04-02 MED ORDER — METHOCARBAMOL 500 MG PO TABS: 500.0000 mg | ORAL_TABLET | Freq: Three times a day (TID) | ORAL | 0 refills | Status: DC | PRN

## 2016-04-02 NOTE — Telephone Encounter (Signed)
Last office visit 03/29/2016.  Last refilled 03/11/2016 #90 with no refills.  Ok to refill?

## 2016-04-09 ENCOUNTER — Emergency Department: Payer: PRIVATE HEALTH INSURANCE

## 2016-04-09 ENCOUNTER — Telehealth: Payer: Self-pay | Admitting: Family Medicine

## 2016-04-09 ENCOUNTER — Emergency Department
Admission: EM | Admit: 2016-04-09 | Discharge: 2016-04-09 | Disposition: A | Discharge status: Home / Self Care | Payer: PRIVATE HEALTH INSURANCE | Attending: Emergency Medicine | Admitting: Emergency Medicine

## 2016-04-09 DIAGNOSIS — R0781 Pleurodynia: Secondary | ICD-10-CM | POA: Insufficient documentation

## 2016-04-09 DIAGNOSIS — R079 Chest pain, unspecified: Secondary | ICD-10-CM

## 2016-04-09 DIAGNOSIS — I10 Essential (primary) hypertension: Secondary | ICD-10-CM | POA: Diagnosis not present

## 2016-04-09 DIAGNOSIS — F17210 Nicotine dependence, cigarettes, uncomplicated: Secondary | ICD-10-CM | POA: Insufficient documentation

## 2016-04-09 DIAGNOSIS — R0602 Shortness of breath: Secondary | ICD-10-CM | POA: Insufficient documentation

## 2016-04-09 DIAGNOSIS — Z79899 Other long term (current) drug therapy: Secondary | ICD-10-CM | POA: Insufficient documentation

## 2016-04-09 LAB — TROPONIN I

## 2016-04-09 LAB — URINE DRUG SCREEN, QUALITATIVE (ARMC ONLY)
AMPHETAMINES, UR SCREEN: NOT DETECTED
BARBITURATES, UR SCREEN: NOT DETECTED
Benzodiazepine, Ur Scrn: POSITIVE — AB
CANNABINOID 50 NG, UR ~~LOC~~: NOT DETECTED
COCAINE METABOLITE, UR ~~LOC~~: NOT DETECTED
MDMA (Ecstasy)Ur Screen: NOT DETECTED
Methadone Scn, Ur: NOT DETECTED
Opiate, Ur Screen: POSITIVE — AB
Phencyclidine (PCP) Ur S: NOT DETECTED
Tricyclic, Ur Screen: NOT DETECTED

## 2016-04-09 LAB — BASIC METABOLIC PANEL
Anion gap: 7 (ref 5–15)
BUN: 7 mg/dL (ref 6–20)
CALCIUM: 9.5 mg/dL (ref 8.9–10.3)
CO2: 32 mmol/L (ref 22–32)
CREATININE: 1.27 mg/dL — AB (ref 0.61–1.24)
Chloride: 98 mmol/L — ABNORMAL LOW (ref 101–111)
GFR calc Af Amer: >60 mL/min (ref >60)
GFR calc non Af Amer: >60 mL/min (ref >60)
GLUCOSE: 94 mg/dL (ref 65–99)
Potassium: 3.5 mmol/L (ref 3.5–5.1)
Sodium: 137 mmol/L (ref 135–145)

## 2016-04-09 LAB — CBC
HCT: 45.8 % (ref 40.0–52.0)
HEMOGLOBIN: 15.8 g/dL (ref 13.0–18.0)
MCH: 30.5 pg (ref 26.0–34.0)
MCHC: 34.6 g/dL (ref 32.0–36.0)
MCV: 88.3 fL (ref 80.0–100.0)
PLATELETS: 268 10*3/uL (ref 150–440)
RBC: 5.18 MIL/uL (ref 4.40–5.90)
RDW: 14.7 % — ABNORMAL HIGH (ref 11.5–14.5)
WBC: 11.1 10*3/uL — ABNORMAL HIGH (ref 3.8–10.6)

## 2016-04-09 LAB — BRAIN NATRIURETIC PEPTIDE: B Natriuretic Peptide: 12 pg/mL (ref 0.0–100.0)

## 2016-04-09 MED ORDER — ACETAMINOPHEN 500 MG PO TABS: 1000.0000 mg | ORAL_TABLET | Freq: Three times a day (TID) | ORAL | 2 refills | Status: AC | PRN

## 2016-04-09 MED ORDER — LIDOCAINE 5 % EX PTCH: 1.0000 | MEDICATED_PATCH | Freq: Two times a day (BID) | CUTANEOUS | 0 refills | Status: DC

## 2016-04-09 MED ORDER — SODIUM CHLORIDE 0.9 % IV BOLUS (SEPSIS)
1000.0000 mL | Freq: Once | INTRAVENOUS | Status: AC
  Administered 2016-04-09: 1000 mL via INTRAVENOUS

## 2016-04-09 MED ORDER — MORPHINE SULFATE (PF) 4 MG/ML IV SOLN
4.0000 mg | Freq: Once | INTRAVENOUS | Status: AC
  Administered 2016-04-09: 4 mg via INTRAVENOUS
  Filled 2016-04-09: qty 1

## 2016-04-09 MED ORDER — IOPAMIDOL (ISOVUE-370) INJECTION 76%
75.0000 mL | Freq: Once | INTRAVENOUS | Status: DC | PRN
Start: 2016-04-09 — End: 2016-04-09

## 2016-04-09 MED ORDER — DICLOFENAC SODIUM 1 % TD GEL: 20.0000 g | Freq: Four times a day (QID) | TRANSDERMAL | 0 refills | Status: DC

## 2016-04-09 MED ORDER — IOPAMIDOL (ISOVUE-370) INJECTION 76%
100.0000 mL | Freq: Once | INTRAVENOUS | Status: AC | PRN
  Administered 2016-04-09: 100 mL via INTRAVENOUS

## 2016-04-09 NOTE — Telephone Encounter (Signed)
TELEPHONE ADVICE RECORD [facility]  Patient Name: Jeffrey Lin  DOB: 01/16/1976    Initial Comment Caller has stabbing pain under his heart.    Nurse Assessment  Nurse: Odis LusterBowers, RN, Bjorn Loserhonda Date/Time [name](Eastern Time): 04/09/2016 3:42:25 PM  Confirm and document reason for call. If symptomatic, describe symptoms. ---Caller has stabbing pain under his heart. SOB. Left side of his chest.  Does the patient have any new or worsening symptoms? ---Yes  Will a triage be completed? ---Yes  Related visit to physician within the last 2 weeks? ---N/A  Does the PT have any chronic conditions? (i.e. diabetes, asthma, etc.) ---Yes  List chronic conditions. ---HTN  Is this a behavioral health or substance abuse call? ---No     Guidelines    Guideline Title Affirmed Question Affirmed Notes  Chest Pain Difficult to awaken or acting confused (e.g., disoriented, slurred speech)    Final Disposition User   Call EMS 911 Now Odis LusterBowers, RN, Bjorn Loserhonda    Disagree/Comply: Danella Maiersomply

## 2016-04-09 NOTE — Telephone Encounter (Signed)
Please contact Dr. Evelene CroonKaur ( call or send a message) , pt's psychiatrist.. Pt has been having increase in peripheral edema since January.  He has had negative heart, kidney and liver eval, he has not responded to  diuretics. The edema corresponds with when his  combination medication was split into into parts (olanzapine and amantadine). Was either dose increased? These do have associated risk of SE of peripheral edema.. Can he stop or change either component?   If further question or discussion needed, I would be happy to speak with her on Thursday.

## 2016-04-09 NOTE — ED Triage Notes (Signed)
Pt bib [facility] w/ c/o CP that started today at home.  Per EMS, pain worse w/ movement.  Pt A/OX4, sts that pain below L chest w/ no radiation.  Pt also sts SOB and a little weak, able to move off strechter w/o issue.

## 2016-04-09 NOTE — ED Notes (Signed)
Pt sts that he had "US of my heart" a couple weeks ago, reports to results showed heart enlarged.

## 2016-04-09 NOTE — Discharge Instructions (Signed)

## 2016-04-09 NOTE — Telephone Encounter (Signed)
Noted. Agree with recommendations.  

## 2016-04-09 NOTE — ED Notes (Signed)
Pt sts that he is unable to void at this time

## 2016-04-09 NOTE — ED Notes (Signed)
toileting offered, pt unable to give UDS at this time.  Pt given urinal

## 2016-04-09 NOTE — ED Provider Notes (Signed)
[facility] Emergency Department Provider Note  ____________________________________________  Time seen: Approximately 4:49 PM  I have reviewed the triage vital signs and the nursing notes.   HISTORY  Chief Complaint Chest Pain   HPI Jeffrey Lin is a 41 y.o. male a history of hypertension, schizoaffective disorder, and one prior provoked DVT who presents for evaluation of chest pain. Patient reports that he was driving his car earlier today when he developed sudden onset of left-sided chest pain. The pain has been getting progressively worse throughout the day and is now associated shortness of breath. The pain is pleuritic, 9 out of 10, located in the left side of his chest, sharp stabbing quality, nonradiating. Patient denies ever having similar pain. Patient denies worsening leg swelling than his baseline, denies leg pain, denies hemoptysis, recent travel or immobilization, or exogenous hormones. Patient had a DVT in 2012 in the setting of an accident. He was on blood thinners for 6 months however hasn't been on any since then. He denies family history of DVT PE or ischemic heart disease. He is a smoker. He denies drug use.Patient denies diaphoresis, dizziness, cough, congestion, abdominal pain, nausea, vomiting, diarrhea.  Past Medical History:  Diagnosis Date  . Anxiety   . Chronic leg pain   . Depression   . Essential hypertension, benign 10/20/2015  . Schizoaffective disorder [facility])     Patient Active Problem List   Diagnosis Date Noted  . Hypokalemia 03/22/2016  . Peripheral edema 02/29/2016  . Hoarseness of voice 11/23/2015  . Foreign body sensation in throat 11/23/2015  . Essential hypertension, benign 10/20/2015  . Onychomycosis 10/05/2015  . Schizoaffective disorder, depressive type (HCC) 02/17/2015  . Vitamin D deficiency 02/14/2015  . Low back pain 10/27/2014  . Thoracic back pain 05/21/2013  . Psychotic mood disorder (HCC) 05/14/2012    . Left leg pain 09/13/2010  . DVT (deep venous thrombosis) (HCC) 07/06/2010  . History of patellar fracture 07/06/2010  . HYPERCHOLESTEROLEMIA 10/13/2009  . PREDIABETES 10/13/2009  . Anxiety state 06/02/2006  . TOBACCO ABUSE 06/02/2006  . DEPRESSION 06/02/2006  . Chronic back pain 06/02/2006    Past Surgical History:  Procedure Laterality Date  . Block procedure at pain center  03/27/06  . Bone graft from hip to jaw  2004  . Bone graft right side of head to jaw  2006  . 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 Sphenopalatine ganglionic      Prior to Admission medications   Medication Sig Start Date End Date Taking? Authorizing Provider  amantadine (SYMMETREL) 100 MG capsule Take 100 mg by mouth 2 (two) times daily. 08/06/15  Yes Historical Provider, MD  atorvastatin (LIPITOR) 40 MG tablet TAKE 1 TABLET (40 MG TOTAL) BY MOUTH DAILY. 11/20/15  Yes Amy Michelle Nasuti, MD  benztropine (COGENTIN) 1 MG tablet Take 1 mg by mouth at bedtime.  08/28/15  Yes Historical Provider, MD  diazepam (VALIUM) 10 MG tablet Take 10 mg by mouth 4 (four) times daily.  09/12/15  Yes Historical Provider, MD  FLUoxetine (PROZAC) 20 MG capsule Take 20 mg by mouth daily. 02/14/16  Yes Historical Provider, MD  furosemide (LASIX) 20 MG tablet Take 1 tablet (20 mg total) by mouth daily. 03/29/16 04/09/16 Yes Amy Michelle Nasuti, MD  gabapentin (NEURONTIN) 600 MG tablet Take 600-1,200 mg by mouth 3 (three) times daily as needed.    Yes Historical Provider, MD  hydrOXYzine (VISTARIL) 25 MG capsule TAKE 1 CAPSULE BY  MOUTH 4 TIMES A DAY 08/02/15  Yes Historical Provider, MD  losartan (COZAAR) 100 MG tablet Take 1 tablet (100 mg total) by mouth daily. 03/29/16  Yes Amy Michelle Nasuti, MD  methocarbamol (ROBAXIN) 500 MG tablet Take 1 tablet (500 mg total) by mouth every 8 (eight) hours as needed for muscle spasms. 04/02/16  Yes Amy E Bedsole, MD  OLANZapine (ZYPREXA) 10 MG tablet TAKE 1 TABLET BY MOUTH AT BEDTIME (REPLACES SYMBYAX) 02/14/16   Yes Historical Provider, MD  omeprazole (PRILOSEC) 20 MG capsule Take 20 mg by mouth daily as needed.   Yes Historical Provider, MD  potassium chloride (K-DUR) 10 MEQ tablet Take 1 tablet (10 mEq total) by mouth daily. 03/29/16 04/09/16 Yes Amy Michelle Nasuti, MD  Vitamin D, Ergocalciferol, (DRISDOL) 50000 units CAPS capsule Take 1 capsule (50,000 Units total) by mouth every 7 (seven) days. Patient taking differently: Take 50,000 Units by mouth 2 (two) times a week.  02/14/15  Yes Amy Michelle Nasuti, MD  acetaminophen (TYLENOL) 500 MG tablet Take 2 tablets (1,000 mg total) by mouth every 8 (eight) hours as needed. 04/09/16 04/09/17  Nita Sickle, MD  diclofenac sodium (VOLTAREN) 1 % GEL Apply 20 g topically 4 (four) times daily. 04/09/16   Nita Sickle, MD  lidocaine (LIDODERM) 5 % Place 1 patch onto the skin every 12 (twelve) hours. Remove & Discard patch within 12 hours or as directed by MD 04/09/16 04/09/17  Nita Sickle, MD  triamcinolone cream (KENALOG) 0.1 % Apply 1 application topically 2 (two) times daily. Patient not taking: Reported on 04/09/2016 09/15/15   Excell Seltzer, MD    Allergies Toradol [ketorolac tromethamine] and Lamotrigine  Family History  Problem Relation Age of Onset  . Hyperlipidemia Mother   . Hypertension Mother   . Depression Mother   . Anxiety disorder Mother   . Depression Brother   . Anxiety disorder Brother   . Bipolar disorder Maternal Grandfather   . Schizophrenia Maternal Grandfather   . Bipolar disorder Paternal Grandfather     Social History Social History  Substance Use Topics  . Smoking status: Current Every Day Smoker    Packs/day: 0.50    Years: 10.00    Types: Cigarettes    Start date: 09/22/1995  . Smokeless tobacco: Former Neurosurgeon  . Alcohol use No    Review of Systems  Constitutional: Negative for fever. Eyes: Negative for visual changes. ENT: Negative for sore throat. Neck: No neck pain  Cardiovascular: + chest pain. Respiratory: +  shortness of breath. Gastrointestinal: Negative for abdominal pain, vomiting or diarrhea. Genitourinary: Negative for dysuria. Musculoskeletal: Negative for back pain. Skin: Negative for rash. Neurological: Negative for headaches, weakness or numbness. Psych: No SI or HI  ____________________________________________   PHYSICAL EXAM:  VITAL SIGNS: ED Triage Vitals  Enc Vitals Group     BP 04/09/16 1635 (!) 138/98     Pulse Rate 04/09/16 1635 (!) 111     Resp 04/09/16 1635 (!) 24     Temp 04/09/16 1635 97.6 F (36.4 C)     Temp Source 04/09/16 1635 Oral     SpO2 04/09/16 1635 93 %     Weight 04/09/16 1630 275 lb (124.7 kg)     Height 04/09/16 1630 6\' 4"  (1.93 m)     Head Circumference --      Peak Flow --      Pain Score 04/09/16 1631 8     Pain Loc --      Pain  Edu? --      Excl. in GC? --     Constitutional: Alert and oriented, mild respiratory distress.  HEENT:      Head: Normocephalic and atraumatic.         Eyes: Conjunctivae are normal. Sclera is non-icteric. EOMI. PERRL      Mouth/Throat: Mucous membranes are moist.       Neck: Supple with no signs of meningismus. Cardiovascular: Tachycardic with regular rhythm. No murmurs, gallops, or rubs. 2+ symmetrical distal pulses are present in all extremities. No JVD. Respiratory: Tachypnea with mildly increased work of breathing, satting between 90-95% on room air, left lung is clear with good air movement, decreased breath sounds on the right , no wheezing or crackles. Pain is reproducible on palpation Gastrointestinal: Soft, non tender, and non distended with positive bowel sounds. No rebound or guarding. Musculoskeletal: 2+ pitting edema bilaterally  Neurologic: Normal speech and language. Face is symmetric. Moving all extremities. No gross focal neurologic deficits are appreciated. Skin: Skin is warm, dry and intact. No rash noted. Psychiatric: Mood and affect are normal. Speech and behavior are  normal.  ____________________________________________   LABS (all labs ordered are listed, but only abnormal results are displayed)  Labs Reviewed  BASIC METABOLIC PANEL - Abnormal; Notable for the following:       Result Value   Chloride 98 (*)    Creatinine, Ser 1.27 (*)    All other components within normal limits  CBC - Abnormal; Notable for the following:    WBC 11.1 (*)    RDW 14.7 (*)    All other components within normal limits  URINE DRUG SCREEN, QUALITATIVE ([facility] ONLY) - Abnormal; Notable for the following:    Opiate, Ur Screen POSITIVE (*)    Benzodiazepine, Ur Scrn POSITIVE (*)    All other components within normal limits  TROPONIN I  BRAIN NATRIURETIC PEPTIDE  TROPONIN I   ____________________________________________  EKG  ED ECG REPORT I, Nita Sicklearolina Lathyn Griggs, the attending physician, personally viewed and interpreted this ECG.  Sinus tachycardia, rate of 109, normal intervals, right axis deviation, no ST elevations or depressions. Unchanged from prior other than sinus tachycardia ____________________________________________  RADIOLOGY  CXR: Focal left pleural effusion with left base atelectasis. Lungs elsewhere clear. Stable cardiac prominence.  CTA chest: 1. No CT findings for pulmonary embolism. 2. Patchy ground-glass opacity and dependent subpleural atelectasis/edema. No focal pneumonia, pulmonary edema or pleural effusion. 3. Normal thoracic aorta. ____________________________________________   PROCEDURES  Procedure(s) performed: None Procedures Critical Care performed:  None ____________________________________________   INITIAL IMPRESSION / ASSESSMENT AND PLAN / ED COURSE  41 y.o. male a history of hypertension, schizoaffective disorder, and one prior provoked DVT who presents for evaluation of sudden onset of severe left sided stabbing chest pain. Patient is in mild respiratory distress, patient is tachycardic with heart rate in the 110s,  his tachypnea with respiratory rate in the mid 20s, satting between 90-95% on room air, he does have diminished air movement on the right with normal air movement on the left. No wheezing or crackles, patient has 2+ pitting edema bilateral lower extremities. EKG with no evidence of ischemia showing sinus tachycardia. Patient had a echocardiogram done less than a month ago which was normal. At this time I'm concerned for PE, we'll send patient for CTA.  _________________________ 8:17 PM on 04/09/2016 -----------------------------------------  Troponin 2 negative. CTA with no acute findings. Pain is reproducible and on palpation possible musculoskeletal. We'll discharge home on Voltaren gel, Tylenol,  heat compresses, and lidoderm patch. Patient has f/u with PCP in 2 days. Discussed return precautions with patient.     Pertinent labs & imaging results that were available during my care of the patient were reviewed by me and considered in my medical decision making (see chart for details).    ____________________________________________   FINAL CLINICAL IMPRESSION(S) / ED DIAGNOSES  Final diagnoses:  Chest pain, unspecified type      NEW MEDICATIONS STARTED DURING THIS VISIT:  New Prescriptions   ACETAMINOPHEN (TYLENOL) 500 MG TABLET    Take 2 tablets (1,000 mg total) by mouth every 8 (eight) hours as needed.   DICLOFENAC SODIUM (VOLTAREN) 1 % GEL    Apply 20 g topically 4 (four) times daily.   LIDOCAINE (LIDODERM) 5 %    Place 1 patch onto the skin every 12 (twelve) hours. Remove & Discard patch within 12 hours or as directed by MD     Note:  This document was prepared using Dragon voice recognition software and may include unintentional dictation errors.    Nita Sickle, MD 04/09/16 2018

## 2016-04-10 NOTE — Telephone Encounter (Signed)
Received fax back from Dr. Evelene CroonKaur that states most likely Olanzapine (Zyprexa).  They will call [name] and advise him to hold it for know.

## 2016-04-10 NOTE — Telephone Encounter (Signed)
Message faxed to Dr. Evelene CroonKaur at [phone].  Advised she could respond to fax or reach Dr. Ermalene SearingBedsole on Thursday by calling [phone].

## 2016-04-11 NOTE — Telephone Encounter (Signed)
Noted  

## 2016-04-12 ENCOUNTER — Ambulatory Visit (INDEPENDENT_AMBULATORY_CARE_PROVIDER_SITE_OTHER): Payer: PRIVATE HEALTH INSURANCE | Admitting: Family Medicine

## 2016-04-12 ENCOUNTER — Encounter: Payer: Self-pay | Admitting: Family Medicine

## 2016-04-12 ENCOUNTER — Ambulatory Visit (INDEPENDENT_AMBULATORY_CARE_PROVIDER_SITE_OTHER)
Admission: RE | Admit: 2016-04-12 | Discharge: 2016-04-12 | Disposition: A | Discharge status: Home / Self Care | Payer: PRIVATE HEALTH INSURANCE | Source: Ambulatory Visit | Attending: Family Medicine | Admitting: Family Medicine

## 2016-04-12 VITALS — BP 120/90 | HR 105 | Temp 98.4°F | Ht 76.0 in | Wt 279.0 lb

## 2016-04-12 DIAGNOSIS — R609 Edema, unspecified: Secondary | ICD-10-CM

## 2016-04-12 DIAGNOSIS — I10 Essential (primary) hypertension: Secondary | ICD-10-CM | POA: Diagnosis not present

## 2016-04-12 DIAGNOSIS — R600 Localized edema: Secondary | ICD-10-CM

## 2016-04-12 DIAGNOSIS — J90 Pleural effusion, not elsewhere classified: Secondary | ICD-10-CM | POA: Diagnosis not present

## 2016-04-12 MED ORDER — METOPROLOL SUCCINATE ER 50 MG PO TB24: 50.0000 mg | ORAL_TABLET | Freq: Every day | ORAL | 11 refills | Status: DC

## 2016-04-12 NOTE — Patient Instructions (Addendum)
Stay off olanzapine. Increase lasix to 40 mg daily x 5 days then return to 1 tab daily.. Inccrease potassium to 2 tabs while on higher dose. Continue losartan but add metoprolol daily.  Please stop  or X-ray.. We will call with results if any interval change. Follow blood pressures and weights.. Call in 1  week with measurements and with update on fluid status. If severe shortness of breath or chest pain .Marland Kitchen. Go to ER.

## 2016-04-12 NOTE — Progress Notes (Signed)
Pre visit review using our clinic review tool, if applicable. No additional management support is needed unless otherwise documented below in the visit note. 

## 2016-04-12 NOTE — Telephone Encounter (Signed)
Returned call. Spoke to a call center employee who was not able to give me any information and had no idea why I was calling.

## 2016-04-12 NOTE — Assessment & Plan Note (Signed)
Add metoprolol for improved blood pressure control. Follow BP at home.

## 2016-04-12 NOTE — Assessment & Plan Note (Signed)
Will re-eval for change in last 3 days... New rales present bilaterally on exam. Diurese more.

## 2016-04-12 NOTE — Progress Notes (Signed)
   Subjective:    Patient ID: Jeffrey Lin, male    DOB: 10/17/1975, 41 y.o.   MRN: [id number]  HPI    41 year old male with recent peripheral edema presents for follow up. Since last OV he experienced chest pain.  Went to the ER for evaluation on 04/09/16  Note reviewed in detail.  Pain was sudden onset left sided and was associated with shortness of breath  CT angio was unremarkable except patchy ground glass change.. Atelectasis /edema  CXR  Showed focal left pleural effusion with left base atelectasis, no consolidaiton  UDS negative, EKG and labs (including troponin I) unremarkable. Creatinine slightly increased at 1.27, potassium nml on lasix.  BNP 12  Wt Readings from Last 3 Encounters:  04/12/16 279 lb (126.6 kg)  04/09/16 275 lb (124.7 kg)  03/29/16 275 lb (124.7 kg)    Today he reports no further chest pain.. But pain has moved into left upper abdominal pain. Pain is present constantly, occ worse.  no N/V, no D, no C. In last month he has used omeprazole after eating spicy ot tomato foods. Has used 4-5 times.  He feels that he has noted some improvement in peripheral swelling.  Psychiatry has allowed hum to hold the olanzapine given this may be causing swelling as a side effect.  He continues to use 20 mg daily.  BP Readings from Last 3 Encounters:  04/12/16 120/90  04/09/16 (!) 129/93  03/29/16 119/90   BPs at home 136-143/89-101  On losartan 100 mg    Review of Systems  Constitutional: Negative for fatigue and fever.  HENT: Negative for ear pain.   Eyes: Negative for pain.  Respiratory: Negative for cough, chest tightness and shortness of breath.   Cardiovascular: Positive for chest pain and leg swelling.  Gastrointestinal: Positive for abdominal distention.       Objective:   Physical Exam  Constitutional: Vital signs are normal. He appears well-developed and well-nourished.  HENT:  Head: Normocephalic.  Right Ear: Hearing normal.  Left Ear: Hearing  normal.  Nose: Nose normal.  Mouth/Throat: Oropharynx is clear and moist and mucous membranes are normal.  Neck: Trachea normal. Carotid bruit is not present. No thyroid mass and no thyromegaly present.  Cardiovascular: Regular rhythm and normal pulses.  Tachycardia present.  Exam reveals no gallop, no distant heart sounds and no friction rub.   No murmur heard. 1 plus pitting edema bilaterally   Pulmonary/Chest: Effort normal. No respiratory distress. He has no decreased breath sounds. He has rales in the right lower field, the left middle field and the left lower field. He exhibits tenderness.    Area of pain  Skin: Skin is warm, dry and intact. No rash noted.  Psychiatric: He has a normal mood and affect. His speech is normal and behavior is normal. Thought content normal.     Blood pressure 120/90, pulse (!) 105, temperature 98.4 F (36.9 C), temperature source Oral, height 6\' 4"  (1.93 m), weight 279 lb (126.6 kg).      Assessment & Plan:

## 2016-04-12 NOTE — Assessment & Plan Note (Signed)
No clear cause except possibly SE to olanzapine or other psycho tropic meds... Now holding olanzapine. Need better BP control. Increase lasix to 40 mg daily x 5 days.

## 2016-04-19 ENCOUNTER — Other Ambulatory Visit: Payer: Self-pay | Admitting: Family Medicine

## 2016-04-19 ENCOUNTER — Encounter: Payer: Self-pay | Admitting: Family Medicine

## 2016-04-19 NOTE — Telephone Encounter (Signed)
Last office visit 04/12/16. Last refilled 04/02/16 for #90 with no refills.  Ok to refill?

## 2016-04-21 MED ORDER — METHOCARBAMOL 500 MG PO TABS: 500.0000 mg | ORAL_TABLET | Freq: Three times a day (TID) | ORAL | 0 refills | Status: DC | PRN

## 2016-04-23 ENCOUNTER — Telehealth: Payer: Self-pay

## 2016-04-23 NOTE — Telephone Encounter (Signed)
Pt wanted to know if losartan 100 mg had been sent to [facility]; advised pt yes and pt will ck with pharmacy. Pt will cb if needed.

## 2016-04-25 ENCOUNTER — Encounter: Payer: Self-pay | Admitting: Family Medicine

## 2016-05-09 ENCOUNTER — Ambulatory Visit (INDEPENDENT_AMBULATORY_CARE_PROVIDER_SITE_OTHER): Payer: PRIVATE HEALTH INSURANCE | Admitting: Podiatry

## 2016-05-09 ENCOUNTER — Encounter: Payer: Self-pay | Admitting: Podiatry

## 2016-05-09 DIAGNOSIS — B351 Tinea unguium: Secondary | ICD-10-CM | POA: Diagnosis not present

## 2016-05-09 DIAGNOSIS — M79672 Pain in left foot: Secondary | ICD-10-CM

## 2016-05-09 DIAGNOSIS — M79675 Pain in left toe(s): Secondary | ICD-10-CM | POA: Diagnosis not present

## 2016-05-09 DIAGNOSIS — M79674 Pain in right toe(s): Secondary | ICD-10-CM

## 2016-05-09 DIAGNOSIS — M722 Plantar fascial fibromatosis: Secondary | ICD-10-CM

## 2016-05-09 DIAGNOSIS — L600 Ingrowing nail: Secondary | ICD-10-CM

## 2016-05-09 MED ORDER — METHYLPREDNISOLONE 4 MG PO TBPK: ORAL_TABLET | ORAL | 0 refills | Status: DC

## 2016-05-09 MED ORDER — NONFORMULARY OR COMPOUNDED ITEM: 0 refills | Status: DC

## 2016-05-09 NOTE — Patient Instructions (Signed)

## 2016-05-09 NOTE — Progress Notes (Signed)
Subjective: 41 year old male presents the office today for concerns of both of his big toenails becoming ingrown and painful with the tips the toenails. He said nails are also discolored and thick and they cause pain tissue to all this toenails. Denies any redness or drainage or any swelling the toenail sites. I'll see some left-sided gets some sharp pain in the arch of his foot which is intermittent in nature and is worse with walking. He has no pain in the morning or at night. Pain is no edema. He has no recent injury or trauma. No symptoms on the right side. Denies any systemic complaints such as fevers, chills, nausea, vomiting. No acute changes since last appointment, and no other complaints at this time.   Objective: AAO x3, NAD DP/PT pulses palpable bilaterally, CRT less than 3 seconds Decrease in medial arch height upon weightbearing. Negative Tinel sign is present. There is tenderness on the medial band of plantar fascia and the arch of the foot. As noted on the insertion. There is no area pinpoint bony tenderness and vibratory sensation. Nails are hypertrophic, dystrophic, brittle, discolored, elongated 10. No surrounding redness or drainage. Tenderness nails 1-5 bilaterally. Mostly along bilateral hallux toenails are tender to the tip of the toenail. There is no edema or clinical signs of infection. There is incurvation along the distal toenails. No open lesions or pre-ulcerative lesions are identified today No pain with calf compression, swelling, warmth, erythema  Assessment: Symptomatic onychomycosis hallux ingrown toenails, left plantar fasciitis  Plan: -All treatment options discussed with the patient including all alternatives, risks, complications.  -Nails debrided 10 without, occasions or bleeding. Upon debridement as able to remove the symptomatic portion of the ingrown toenail without any complications. Discussed that symptoms continue will likely need to have partial nail  avulsions portions to hold off today. I ordered a compound cream for onychomycosis -In regards to left arch pain discussed stretching, icing as well as describe a medrol dose pack discussed side effects the medication. Recommend he continue with stretching exercises daily discussed arch supports as well as shoe gear modifications. -Follow-up as scheduled or sooner if needed. -Patient encouraged to call the office with any questions, concerns, change in symptoms.   [name], DPM

## 2016-05-13 ENCOUNTER — Other Ambulatory Visit: Payer: Self-pay | Admitting: Family Medicine

## 2016-05-14 MED ORDER — METHOCARBAMOL 500 MG PO TABS: 500.0000 mg | ORAL_TABLET | Freq: Three times a day (TID) | ORAL | 0 refills | Status: DC | PRN

## 2016-05-14 NOTE — Telephone Encounter (Signed)
Last office visit 04/12/2016.  Last refilled 04/21/2016 for #90 with no refills.  Ok to refill?

## 2016-05-22 ENCOUNTER — Other Ambulatory Visit: Payer: Self-pay | Admitting: Family Medicine

## 2016-05-30 ENCOUNTER — Encounter: Payer: Self-pay | Admitting: Podiatry

## 2016-05-30 ENCOUNTER — Ambulatory Visit (INDEPENDENT_AMBULATORY_CARE_PROVIDER_SITE_OTHER): Payer: PRIVATE HEALTH INSURANCE | Admitting: Podiatry

## 2016-05-30 DIAGNOSIS — L600 Ingrowing nail: Secondary | ICD-10-CM

## 2016-05-30 DIAGNOSIS — M79672 Pain in left foot: Secondary | ICD-10-CM | POA: Diagnosis not present

## 2016-05-30 MED ORDER — IBUPROFEN 600 MG PO TABS: 600.0000 mg | ORAL_TABLET | Freq: Three times a day (TID) | ORAL | 0 refills | Status: DC | PRN

## 2016-05-30 NOTE — Patient Instructions (Signed)

## 2016-06-03 ENCOUNTER — Telehealth: Payer: Self-pay | Admitting: *Deleted

## 2016-06-03 ENCOUNTER — Other Ambulatory Visit: Payer: Self-pay | Admitting: Family Medicine

## 2016-06-03 MED ORDER — METHOCARBAMOL 500 MG PO TABS: 500.0000 mg | ORAL_TABLET | Freq: Three times a day (TID) | ORAL | 0 refills | Status: DC | PRN

## 2016-06-03 NOTE — Telephone Encounter (Signed)
Last office visit 04/12/2016.  Last refilled 05/14/2016 for #90 with no refills.  Ok to refill?

## 2016-06-03 NOTE — Telephone Encounter (Signed)
Pt asked the pharmacy his prescription was called. I left message [facility] pt [facility] [address] 05/30/2016 at 2:44pm.

## 2016-06-06 ENCOUNTER — Ambulatory Visit (INDEPENDENT_AMBULATORY_CARE_PROVIDER_SITE_OTHER): Payer: Self-pay | Admitting: Podiatry

## 2016-06-06 ENCOUNTER — Encounter: Payer: Self-pay | Admitting: Podiatry

## 2016-06-06 DIAGNOSIS — L600 Ingrowing nail: Secondary | ICD-10-CM

## 2016-06-06 NOTE — Progress Notes (Signed)
Subjective: 41 year old male presents the office today for concerns of continued pain along the left hallux toenail on the nail corners. Denies any drainage or pus. Areas painful pressure in shoes. His plantar fasciitis is also improved since wearing the inserts His continue stretching, icing as much as possible. Denies any systemic complaints such as fevers, chills, nausea, vomiting. No acute changes since last appointment, and no other complaints at this time.   Objective: AAO x3, NAD DP/PT pulses palpable bilaterally, CRT less than 3 seconds There is continued tenderness on the left hallux toenail on both the medial and lateral aspects. There is no drainage or pus there is no erythema or ascending cellulitis. Is localized edema on the nail borders.  Is a decrease in the arch height upon weightbearing. There is minimal discomfort on the medial band plantar fascia the arch of the foot. There is no pain along the insertion. No open lesions or pre-ulcerative lesions.  No pain with calf compression, swelling, warmth, erythema  Assessment: Ingrown toenail left hallux  Plan: -All treatment options discussed with the patient including all alternatives, risks, complications.  -At this point wished to proceed with partial nail avulsion of the left hallux toenail. At this time, the patient is requesting partial nail removal with chemical matricectomy to the symptomatic portion of the nail. Risks and complications were discussed with the patient for which they understand and  verbally consent to the procedure. Under sterile conditions a total of 3 mL of a mixture of 2% lidocaine plain and 0.5% Marcaine plain was infiltrated in a hallux block fashion. Once anesthetized, the skin was prepped in sterile fashion. A tourniquet was then applied. Next the medial and lateral aspect of hallux nail border was then sharply excised making sure to remove the entire offending nail border. Once the nails were ensured to be  removed area was debrided and the underlying skin was intact. There is no purulence identified in the procedure. Next phenol was then applied under standard conditions and copiously irrigated. Silvadene was applied. A dry sterile dressing was applied. After application of the dressing the tourniquet was removed and there is found to be an immediate capillary refill time to the digit. The patient tolerated the procedure well any complications. Post procedure instructions were discussed the patient for which he verbally understood. Follow-up in one week for nail check or sooner if any problems are to arise. Discussed signs/symptoms of infection and directed to call the office immediately should any occur or go directly to the emergency room. In the meantime, encouraged to call the office with any questions, concerns, changes symptoms. -Continue with inserts, stretching, icing. Discussed with him possible custom inserts in the future. -Patient encouraged to call the office with any questions, concerns, change in symptoms.   Ovid CurdMatthew Wagoner, DPM

## 2016-06-06 NOTE — Patient Instructions (Signed)

## 2016-06-07 NOTE — Progress Notes (Signed)
Subjective: Linde GillisBobby G Uhrich is a 41 y.o.  male returns to office today for follow up evaluation after having left Hallux partial nail avulsion performed. Patient has been soaking using epsom salts and applying topical antibiotic covered with bandaid daily. No drainage or pus. No swelling. He states that the heel is feeling better and not having any pain. Patient denies fevers, chills, nausea, vomiting. Denies any calf pain, chest pain, SOB.   Objective:  Vitals: Reviewed  General: Well developed, nourished, in no acute distress, alert and oriented x3   Dermatology: Skin is warm, dry and supple bilateral. Left hallux nail border appears to be clean, dry, with mild granular tissue and surrounding scab. There is no surrounding erythema, edema, drainage/purulence. The remaining nails appear unremarkable at this time. There are no other lesions or other signs of infection present.  Neurovascular status: Intact. No lower extremity swelling; No pain with calf compression bilateral.  Musculoskeletal: No tenderness to palpation of the medial and lateral hallux nail fold(s). Muscular strength within normal limits bilateral. No pain along the course or insertion of the plantar fascia. No area of pinpoint tenderness.   Assesement and Plan: S/p partial nail avulsion, doing well.   -Continue soaking in epsom salts twice a day followed by antibiotic ointment and a band-aid. Can leave uncovered at night. Continue this until completely healed.  -If the area has not healed in 2 weeks, call the office for follow-up appointment, or sooner if any problems arise.  -Will hold off on injection today for plantar fasciitis. If symptoms continue will consider. Continue stretching and icing daily.  -Monitor for any signs/symptoms of infection. Call the office immediately if any occur or go directly to the emergency room. Call with any questions/concerns.  Ovid CurdMatthew Rahsaan Weakland, DPM

## 2016-06-21 ENCOUNTER — Other Ambulatory Visit: Payer: Self-pay | Admitting: Family Medicine

## 2016-06-21 MED ORDER — METHOCARBAMOL 500 MG PO TABS: 500.0000 mg | ORAL_TABLET | Freq: Three times a day (TID) | ORAL | 0 refills | Status: DC | PRN

## 2016-06-21 NOTE — Telephone Encounter (Signed)
Last refill 06/03/16, last OV 04/12/16.

## 2016-06-28 ENCOUNTER — Encounter: Payer: Self-pay | Admitting: Family Medicine

## 2016-06-28 ENCOUNTER — Ambulatory Visit (INDEPENDENT_AMBULATORY_CARE_PROVIDER_SITE_OTHER): Payer: PRIVATE HEALTH INSURANCE | Admitting: Family Medicine

## 2016-06-28 DIAGNOSIS — R21 Rash and other nonspecific skin eruption: Secondary | ICD-10-CM | POA: Diagnosis not present

## 2016-06-28 MED ORDER — VITAMIN D (ERGOCALCIFEROL) 1.25 MG (50000 UNIT) PO CAPS: 50000.0000 [IU] | ORAL_CAPSULE | ORAL | Status: DC

## 2016-06-28 MED ORDER — FLUCONAZOLE 150 MG PO TABS: 150.0000 mg | ORAL_TABLET | Freq: Once | ORAL | 0 refills | Status: AC

## 2016-06-28 NOTE — Patient Instructions (Signed)
Use OTC ketoconazole cream twice a day on the itchy rash.  If needed, then use 1 pill of diflucan.  When better, use talc to stay dry.  Take care.  Glad to see you.

## 2016-06-28 NOTE — Progress Notes (Signed)
Tick bite in the groin last month.  Hard to remove but he thought he got it all out.  Then he had itchy rash on L, then R, then B groin.  No FCNAVD.  No other rash.    Meds, vitals, and allergies reviewed.   ROS: Per HPI unless specifically indicated in ROS section   nad ncat Tick bite site on the scrotum appears unremarkable. No spreading erythema. He does have some stretch marks incidentally noted on the inner thighs. He has bilateral tinea cruris that does not appear to be superinfected.

## 2016-06-29 NOTE — Assessment & Plan Note (Signed)
The tick bite does not appear to be an issue at this point. Discussed with patient. If he had fever or illness or a rash suggestive of a tickborne illness that he can let us know. This appears to be an incidental issue in his history at this point. The stretch marks are incidental. The other issue appears to be a superficial fungal infection.Use OTC ketoconazole cream twice a day on the itchy rash.  If needed, then use 1 pill of diflucan.  When better, use talc to stay dry.  Update us as needed.

## 2016-07-05 ENCOUNTER — Other Ambulatory Visit: Payer: Self-pay | Admitting: Family Medicine

## 2016-07-05 MED ORDER — METHOCARBAMOL 500 MG PO TABS: 500.0000 mg | ORAL_TABLET | Freq: Three times a day (TID) | ORAL | 0 refills | Status: DC | PRN

## 2016-07-05 NOTE — Telephone Encounter (Signed)
Last office visit 06/28/2016 with Dr. Para Marchuncan for rash.  Last refilled 06/21/2016 for #90 with no refills.  Ok to refill?

## 2016-07-18 ENCOUNTER — Encounter: Payer: Self-pay | Admitting: Podiatry

## 2016-07-18 ENCOUNTER — Ambulatory Visit (INDEPENDENT_AMBULATORY_CARE_PROVIDER_SITE_OTHER): Payer: PRIVATE HEALTH INSURANCE | Admitting: Podiatry

## 2016-07-18 DIAGNOSIS — B351 Tinea unguium: Secondary | ICD-10-CM

## 2016-07-18 DIAGNOSIS — M79675 Pain in left toe(s): Secondary | ICD-10-CM

## 2016-07-18 DIAGNOSIS — M79674 Pain in right toe(s): Secondary | ICD-10-CM | POA: Diagnosis not present

## 2016-07-18 DIAGNOSIS — L600 Ingrowing nail: Secondary | ICD-10-CM

## 2016-07-21 NOTE — Progress Notes (Signed)
Subjective: [name] since the office today for concerns of his right big toenail becoming painful and thick and causing irritation in shoes. He states the other toenails are also elongated and thickened cannot trim them himself a causing irritation. Left-sided doing well. His heel is doing well and has no pain. He has no new concerns. Denies any redness or drainage maternal sides. Denies any systemic complaints such as fevers, chills, nausea, vomiting. No acute changes since last appointment, and no other complaints at this time.   Objective: AAO x3, NAD DP/PT pulses palpable bilaterally, CRT less than 3 seconds Right hallux toenail is hypertrophic, dystrophic, brittle, discolored, elongated and significantly hypertrophic. The remaining nails are also dystrophic, discolored and hypertrophic with yellow to brown discoloration the nails 2 through 5 on the left and 1 through 5 on the right. There is no redness or drainage or any swelling. There is no clinical signs of infection. There is no pain in the heels identified today. No open lesions or pre-ulcerative lesions.  No pain with calf compression, swelling, warmth, erythema  Assessment: Symptomatic onychomycosis  Plan: -All treatment options discussed with the patient including all alternatives, risks, complications.  -Recommended total nail avulsion of the right hallux was told off. The nails were sharply debrided 9 without complications or bleeding. Continue topical antifungal treatment. -Heels are doing well. Continue with protective measures to help prevent reoccurrence.  -Patient encouraged to call the office with any questions, concerns, change in symptoms.   Ovid CurdMatthew Nefertiti Mohamad, DPM

## 2016-08-02 ENCOUNTER — Other Ambulatory Visit: Payer: Self-pay | Admitting: Family Medicine

## 2016-08-02 MED ORDER — METHOCARBAMOL 500 MG PO TABS: 500.0000 mg | ORAL_TABLET | Freq: Three times a day (TID) | ORAL | 0 refills | Status: DC | PRN

## 2016-08-02 NOTE — Telephone Encounter (Signed)
Last office visit 06/28/2016 with Dr. Para Marchuncan for rash.  Last refilled 07/05/2016 for #90 with no refills.  Ok to refill?

## 2016-08-11 ENCOUNTER — Other Ambulatory Visit: Payer: Self-pay | Admitting: Family Medicine

## 2016-08-20 ENCOUNTER — Other Ambulatory Visit: Payer: Self-pay | Admitting: Family Medicine

## 2016-09-03 ENCOUNTER — Other Ambulatory Visit: Payer: Self-pay | Admitting: Family Medicine

## 2016-09-03 MED ORDER — METHOCARBAMOL 500 MG PO TABS: 500.0000 mg | ORAL_TABLET | Freq: Three times a day (TID) | ORAL | 0 refills | Status: DC | PRN

## 2016-09-03 NOTE — Telephone Encounter (Signed)
MyChart refill request:  Methocarbamol Last office visit:   06/28/16 Last Filled:    90 tablet 0 08/02/2016  Please advise.

## 2016-09-04 ENCOUNTER — Other Ambulatory Visit: Payer: Self-pay | Admitting: Family Medicine

## 2016-09-04 NOTE — Telephone Encounter (Signed)
Dr. Ermalene SearingBedsole  Please advise-  Received a fax about pt's refill for his Methocarbamol 500 mg from [facility]. It states that this medication is on back order. I called the pharmacy and they stated that they will not have any until September. They state that they have the 750 but not 500. Would you like us to see if pt would like to try another pharmacy or would you like to send in something else

## 2016-09-05 MED ORDER — METHOCARBAMOL 750 MG PO TABS: 750.0000 mg | ORAL_TABLET | Freq: Two times a day (BID) | ORAL | 0 refills | Status: DC | PRN

## 2016-09-05 NOTE — Telephone Encounter (Signed)
Call pt to see if willing to go to another pharmacy. Otherwise if not we can have him try to 750 mg every 12 hours instead, temporarily.  #60 0RF

## 2016-09-05 NOTE — Telephone Encounter (Signed)
Spoke with pt and he would prefer to try the 750 until the 500 comes back in Sept. His rx was sent into his pharmacy. He had no additional questions at this time. Nothing further is needed.

## 2016-10-01 ENCOUNTER — Other Ambulatory Visit: Payer: Self-pay | Admitting: Family Medicine

## 2016-10-01 NOTE — Telephone Encounter (Signed)
Last office visit 06/28/2016 with Dr. Para Marchuncan for rash.  There are 2 different strengths on medication list.  Refill?

## 2016-10-28 ENCOUNTER — Other Ambulatory Visit: Payer: Self-pay | Admitting: Family Medicine

## 2016-10-28 NOTE — Telephone Encounter (Signed)
Last office visit 06/28/2016 with Dr. Para March for rash.  Last refilled 10/01/2016 for #60 with no refills.  Ok to refill?

## 2016-11-03 ENCOUNTER — Other Ambulatory Visit: Payer: Self-pay | Admitting: Family Medicine

## 2016-12-09 ENCOUNTER — Ambulatory Visit (INDEPENDENT_AMBULATORY_CARE_PROVIDER_SITE_OTHER): Payer: PRIVATE HEALTH INSURANCE | Admitting: Family Medicine

## 2016-12-09 ENCOUNTER — Encounter: Payer: Self-pay | Admitting: Family Medicine

## 2016-12-09 ENCOUNTER — Other Ambulatory Visit: Payer: Self-pay

## 2016-12-09 VITALS — BP 110/86 | HR 92 | Temp 97.6°F | Ht 76.0 in | Wt 266.5 lb

## 2016-12-09 DIAGNOSIS — H6123 Impacted cerumen, bilateral: Secondary | ICD-10-CM

## 2016-12-09 DIAGNOSIS — M7502 Adhesive capsulitis of left shoulder: Secondary | ICD-10-CM | POA: Diagnosis not present

## 2016-12-09 NOTE — Progress Notes (Signed)
Dr. Karleen HampshireSpencer T. Liann Spaeth, MD, CAQ Sports [facility] [address] [place], [address] Phone: [phone] Fax: [phone]  12/09/2016  Patient: Jeffrey GillisBobby G Lin, MRN: [id number], DOB: 09/06/1975, 41 y.o.  Primary Physician:  Excell SeltzerBedsole, Amy E, MD   Chief Complaint  Patient presents with  . Shoulder Pain    Left  . Trouble Hearing   Subjective:   Jeffrey Lin is a 41 y.o. very pleasant male patient who presents with the following: shoulder pain  The patient noted above presents with shoulder pain that has been ongoing for 6 weeks. The patient denies neck pain or radicular symptoms. No shoulder blade pain Denies dislocation, subluxation, separation of the shoulder. The patient does complain of pain with flexion, abduction, and terminal motion.  Significant restriction of motion. he describes a deep ache around the shoulder, and sometimes it will wake the patient up at night.  About a month ago, woke up and had some pain in the shoulder and it has progressed into the elbow. Sometime the whole arm. Larey SeatFell maybe 6 weeks ago. Went backwards some back then?  Hearing / ears stopped up?  Ice or Heat: minimal help Tried PT: No  Prior shoulder Injury: No Prior surgery: No Prior fracture: No  Past Medical History, Surgical History, Social History, Family History, Medications, and allergies reviewed and updated if relevant.   Patient Active Problem List   Diagnosis Date Noted  . Pleural effusion, left 04/12/2016  . Peripheral edema 02/29/2016  . Hoarseness of voice 11/23/2015  . Foreign body sensation in throat 11/23/2015  . Essential hypertension, benign 10/20/2015  . Rash 09/15/2015  . Schizoaffective disorder, depressive type (HCC) 02/17/2015  . Vitamin D deficiency 02/14/2015  . Low back pain 10/27/2014  . Thoracic back pain 05/21/2013  . 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 Left leg pain 09/13/2010  . DVT (deep venous thrombosis)  (HCC) 07/06/2010  . History of patellar fracture 07/06/2010  . HYPERCHOLESTEROLEMIA 10/13/2009  . PREDIABETES 10/13/2009  . Anxiety state 06/02/2006  . TOBACCO ABUSE 06/02/2006  . DEPRESSION 06/02/2006  . Chronic back pain 06/02/2006    Past Medical History:  Diagnosis Date  . Anxiety   . Chronic leg pain   . Depression   . Essential hypertension, benign 10/20/2015  . Schizoaffective disorder [facility](HCC)     Past Surgical History:  Procedure Laterality Date  . Block procedure at pain center  03/27/06  . Bone graft from hip to jaw  2004  . Bone graft right side of head to jaw  2006  . 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 Sphenopalatine ganglionic      Social History   Socioeconomic History  . Marital status: Single    Spouse name: Not on file  . Number of children: 0  . Years of education: Not on file  . Highest education level: Not on file  Social Needs  . Financial resource strain: Not on file  . Food insecurity - worry: Not on file  . Food insecurity - inability: Not on file  . Transportation needs - medical: Not on file  . Transportation needs - non-medical: Not on file  Occupational History  . Occupation: OrthoptistMachine Shop Tool and Die Maker    Employer: [employer]  Tobacco Use  . Smoking status: Current Every Day Smoker    Packs/day: 0.50    Years: 10.00    Pack years: 5.00    Types:  Cigarettes    Start date: 09/22/1995  . Smokeless tobacco: Former Engineer, water and Sexual Activity  . Alcohol use: No    Alcohol/week: 0.0 oz  . Drug use: No  . Sexual activity: Not Currently  Other Topics Concern  . Not on file  Social History Narrative   In relationship, + sex, + condom   Exercise:  None, running causes head pain   Diet:  FF once daily, + fruits and veggies, + H2O, + Soda    Family History  Problem Relation Age of Onset  . Hyperlipidemia Mother   . Hypertension Mother   . Depression Mother   . Anxiety disorder Mother   . Depression Brother    . Anxiety disorder Brother   . Bipolar disorder Maternal Grandfather   . Schizophrenia Maternal Grandfather   . Bipolar disorder Paternal Grandfather     Allergies  Allergen Reactions  . Toradol [Ketorolac Tromethamine] Swelling  . Lamotrigine Rash    Medication list reviewed and updated in full in [facility].  GEN: No fevers, chills. Nontoxic. Primarily MSK c/o today. MSK: Detailed in the HPI GI: tolerating PO intake without difficulty Neuro: No numbness, parasthesias, or tingling associated. Otherwise the pertinent positives of the ROS are noted above.    Objective:   Blood pressure 110/86, pulse 92, temperature 97.6 F (36.4 C), temperature source Oral, height 6\' 4"  (1.93 m), weight 266 lb 8 oz (120.9 kg).   GEN: WDWN, NAD, Non-toxic, Alert & Oriented x 3 HEENT: Atraumatic, Normocephalic. Cerumen b Ears and Nose: No external deformity. EXTR: No clubbing/cyanosis/edema NEURO: Normal gait.  PSYCH: Normally interactive. Conversant. Not depressed or anxious appearing.  Calm demeanor.   Shoulder: R and L Inspection: No muscle wasting or winging Ecchymosis/edema: neg  AC joint, scapula, clavicle: NT Cervical spine: NT, full ROM Spurling's: neg ABNORMAL SIDE TESTED: L UNLESS OTHERWISE NOTED, THE CONTRALATERAL SIDE HAS FULL RANGE OF MOTION. Abduction: 5/5, LIMITED TO 160 DEGREES Flexion: 5/5, LIMITED TO 165 DEGNO ROM  IR, lift-off: 5/5. TESTED AT 90 DEGREES OF ABDUCTION, LIMITED TO 35 DEGREES ER at neutral:  5/5, TESTED AT 90 DEGREES OF ABDUCTION, LIMITED TO 70 DEGREES AC crossover and compression: PAIN Drop Test: neg Empty Can: neg Supraspinatus insertion: NT Bicipital groove: NT ALL OTHER SPECIAL TESTING EQUIVOCAL GIVEN LOSS OF MOTION C5-T1 intact Sensation intact Grip 5/5   Assessment and Plan:   Secondary adhesive capsulitis of shoulder, left  Bilateral hearing loss due to cerumen impaction  >25 minutes spent in face to face time with patient, >50%  spent in counselling or coordination of care   Patient was given a systematic ROM protocol from [facility] to be done daily. Emphasized importance of adherence, help of PT, daily HEP.  The average length of total symptoms is 12-18 months going through 3 different phases in the freezing and thawing process. Reviewed all with patient.   Tylenol or NSAID of choice prn for pain relief Intraarticular shoulder injections have good evidence for accelerating the thawing phase, but I will avoid in this patient with increased risk for mania.   Declines formal PT.  Ceruminosis is noted.  Wax is removed by syringing and manual debridement. Instructions for home care to prevent wax buildup are given.   Follow-up: 2 mo  Signed,  Aleighya Mcanelly T. Gia Lusher, MD     Medication List        Accurate as of 12/09/16  3:28 PM. Always use your most recent med list.  acetaminophen 500 MG tablet Commonly known as:  TYLENOL Take 2 tablets (1,000 mg total) by mouth every 8 (eight) hours as needed.   amantadine 100 MG capsule Commonly known as:  SYMMETREL   atorvastatin 40 MG tablet Commonly known as:  LIPITOR TAKE 1 TABLET (40 MG TOTAL) BY MOUTH DAILY.   benztropine 1 MG tablet Commonly known as:  COGENTIN   diazepam 10 MG tablet Commonly known as:  VALIUM   FLUoxetine 20 MG capsule Commonly known as:  PROZAC   furosemide 20 MG tablet Commonly known as:  LASIX TAKE 1 TABLET BY MOUTH EVERY DAY   gabapentin 600 MG tablet Commonly known as:  NEURONTIN   hydrOXYzine 25 MG capsule Commonly known as:  VISTARIL   KLOR-CON 10 10 MEQ tablet Generic drug:  potassium chloride TAKE 1 TABLET (10 MEQ TOTAL) BY MOUTH DAILY.   losartan 100 MG tablet Commonly known as:  COZAAR Take 1 tablet (100 mg total) by mouth daily.   methocarbamol 750 MG tablet Commonly known as:  ROBAXIN TAKE 1 TABLET (750 MG TOTAL) BY MOUTH EVERY 12 (TWELVE) HOURS AS NEEDED FOR MUSCLE SPASMS.   metoprolol succinate 50  MG 24 hr tablet Commonly known as:  TOPROL-XL Take 1 tablet (50 mg total) by mouth daily. Take with or immediately following a meal.   NONFORMULARY OR COMPOUNDED ITEM [facility]  Onychomycosis Nail Lacquer -  Fluconazole 2%, Terbinafine 1% DMSO Apply to affected nail once daily Qty. 120 gm 3 refills   OLANZapine 10 MG tablet Commonly known as:  ZYPREXA   omeprazole 20 MG capsule Commonly known as:  PRILOSEC   triamcinolone cream 0.1 % Commonly known as:  KENALOG Apply 1 application topically 2 (two) times daily.   Vitamin D (Ergocalciferol) 50000 units Caps capsule Commonly known as:  DRISDOL Take 1 capsule (50,000 Units total) by mouth 2 (two) times a week.

## 2016-12-09 NOTE — Progress Notes (Signed)
Office Visit Notes faxed to [facility] at [phone] as requested by patient.

## 2016-12-19 ENCOUNTER — Other Ambulatory Visit: Payer: Self-pay | Admitting: Family Medicine

## 2016-12-19 NOTE — Telephone Encounter (Signed)
Last office visit 12/09/2016 with Dr. Patsy Lageropland.  Last refilled 10/28/2016 for #60 with 1 refill.[place]. Ok to refill?

## 2016-12-20 ENCOUNTER — Ambulatory Visit: Payer: PRIVATE HEALTH INSURANCE | Admitting: Podiatry

## 2017-01-24 ENCOUNTER — Ambulatory Visit (INDEPENDENT_AMBULATORY_CARE_PROVIDER_SITE_OTHER): Payer: PRIVATE HEALTH INSURANCE | Admitting: Podiatry

## 2017-01-24 ENCOUNTER — Encounter: Payer: Self-pay | Admitting: Neurology

## 2017-01-24 ENCOUNTER — Telehealth: Payer: Self-pay | Admitting: *Deleted

## 2017-01-24 DIAGNOSIS — B351 Tinea unguium: Secondary | ICD-10-CM | POA: Diagnosis not present

## 2017-01-24 DIAGNOSIS — M79675 Pain in left toe(s): Secondary | ICD-10-CM | POA: Diagnosis not present

## 2017-01-24 DIAGNOSIS — M79674 Pain in right toe(s): Secondary | ICD-10-CM | POA: Diagnosis not present

## 2017-01-24 DIAGNOSIS — R52 Pain, unspecified: Secondary | ICD-10-CM

## 2017-01-24 DIAGNOSIS — M792 Neuralgia and neuritis, unspecified: Secondary | ICD-10-CM

## 2017-01-24 DIAGNOSIS — M722 Plantar fascial fibromatosis: Secondary | ICD-10-CM | POA: Diagnosis not present

## 2017-01-24 NOTE — Telephone Encounter (Signed)
-----   Message from Vivi BarrackMatthew R Wagoner, DPM sent at 01/24/2017 12:34 PM EST ----- Can you please order a nerve conduction test due to burning pain in legs

## 2017-01-24 NOTE — Telephone Encounter (Signed)
Orders faxed to [facility]. 

## 2017-01-27 ENCOUNTER — Other Ambulatory Visit: Payer: Self-pay | Admitting: *Deleted

## 2017-01-27 DIAGNOSIS — M79605 Pain in left leg: Principal | ICD-10-CM

## 2017-01-27 DIAGNOSIS — M79604 Pain in right leg: Secondary | ICD-10-CM

## 2017-01-27 NOTE — Progress Notes (Signed)
Subjective: 42 y.o. returns the office today for painful, elongated, thickened toenails which he cannot trim himself. Denies any redness or drainage around the nails.  He also states that he still gets some occasional sharp pain into his feet, his heels.  He also describes a hot water sensation running down his leg.  He has never had a nerve conduction test that he states.  No recent injury.  Denies any acute changes since last appointment and no new complaints today. Denies any systemic complaints such as fevers, chills, nausea, vomiting.   PCP: Excell SeltzerBedsole, Amy E, MD  Objective: AAO 3, NAD DP/PT pulses palpable, CRT less than 3 seconds Protective sensation intact with Dorann OuSimms Weinstein monofilament Nails hypertrophic, dystrophic, elongated, brittle, discolored 10. There is tenderness overlying the nails 1-5 bilaterally. There is no surrounding erythema or drainage along the nail sites. There is mild discomfort to palpation on the plantar medial tubercle of the calcaneus at the insertion of plantar fascia on the right side but there is no significant tenderness in the left side.  There is no pain with lateral compression of calcaneus.  Achilles tendon appears to be intact.  No other areas of pinpoint tenderness identified bilaterally. No open lesions or pre-ulcerative lesions are identified. No other areas of tenderness bilateral lower extremities. No overlying edema, erythema, increased warmth. No pain with calf compression, swelling, warmth, erythema.  Assessment: Patient presents with symptomatic onychomycosis; neuritis symptoms/plantar fasciitis  Plan: -Treatment options including alternatives, risks, complications were discussed -Nails sharply debrided 10 without complication/bleeding. -I do think that he has some component of plantar fasciitis.  Given the sharp pain to his feet as well as hot water sensation to his legs in order nerve conduction test.  I want him to continue with the  stretching, icing exercises daily for the plantar fasciitis as well as wear supportive shoes.  We will hold off on an injection today. -Discussed daily foot inspection. If there are any changes, to call the office immediately.  -Follow-up in 3 months or sooner if any problems are to arise. In the meantime, encouraged to call the office with any questions, concerns, changes symptoms.  Ovid CurdMatthew Wagoner, DPM

## 2017-01-31 ENCOUNTER — Other Ambulatory Visit: Payer: Self-pay | Admitting: Family Medicine

## 2017-02-04 ENCOUNTER — Other Ambulatory Visit: Payer: Self-pay | Admitting: Family Medicine

## 2017-02-06 ENCOUNTER — Ambulatory Visit (INDEPENDENT_AMBULATORY_CARE_PROVIDER_SITE_OTHER): Payer: PRIVATE HEALTH INSURANCE | Admitting: Neurology

## 2017-02-06 DIAGNOSIS — M79604 Pain in right leg: Secondary | ICD-10-CM | POA: Diagnosis not present

## 2017-02-06 DIAGNOSIS — M79605 Pain in left leg: Secondary | ICD-10-CM

## 2017-02-06 NOTE — Procedures (Signed)
[facility]  [address], [address]  [place], [place] [address] Tel: [phone] Fax:  [phone] Test Date:  02/06/2017  Patient: Jeffrey ColonelBobby Lin DOB: 03/06/1975 Physician: Nita Sickleonika Patel, DO  Sex: Male Height: 6\' 4"  Ref Phys: Ovid CurdMatthew Wagoner, DPM  ID#: [id number] Temp: 34.1C Technician:    Patient Complaints: This is a 42 year-old man referred for evaluation of bilateral sharp feet pain and paresthesias.  NCV & EMG Findings: Extensive electrodiagnostic testing of the right lower extremity and additional studies of the left shows:  1. Bilateral sural and superficial peroneal sensory responses are absent. 2. Left peroneal (EDB) and tibial motor responses show reduced amplitude and mildly slowed conduction velocity. Left peroneal motor response at the tibialis anterior is within normal limits. Right tibial and peroneal responses are within normal limits. 3. Bilateral tibial H reflex studies are prolonged. 4. Chronic motor axon loss changes are isolated to the flexor digitorum longus muscles bilaterally, without accompanied active denervation.  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    ___________________________ Nita Sickleonika Patel, DO    Nerve Conduction Studies Anti Sensory Summary Table   Stim Site NR Peak (ms) Norm Peak (ms) P-T Amp (V) Norm P-T Amp  Left Sup Peroneal Anti Sensory (Ant Lat Mall)  34.1C  12 cm NR  <4.5  >5  Right Sup Peroneal Anti Sensory (Ant Lat Mall)  34.1C  12 cm NR  <4.5  >5  Left Sural Anti Sensory (Lat Mall)  34.1C  Calf NR  <4.5  >5  Right Sural Anti Sensory (Lat Mall)  34.1C  Calf NR  <4.5  >5   Motor Summary Table   Stim Site NR Onset (ms) Norm Onset (ms) O-P Amp (mV) Norm O-P Amp Site1 Site2 Delta-0 (ms) Dist (cm) Vel (m/s) Norm Vel (m/s)  Left Peroneal Motor (Ext Dig Brev)  34.1C  Ankle     4.1 <5.5 2.0 >3 B Fib Ankle 11.0 43.0 39 >40  B Fib    15.1  1.2  Poplt B Fib 2.2 8.0 36 >40  Poplt    17.3  1.2         Right Peroneal Motor (Ext Dig Brev)  34.1C  Ankle    3.0 <5.5 4.4 >3 B Fib Ankle 10.9 44.0 40 >40  B Fib    13.9  4.2  Poplt B Fib 2.2 10.0 45 >40  Poplt    16.1  3.8         Left Peroneal TA Motor (Tib Ant)  34.1C  Fib Head    2.8 <4.0 4.6 >4 Poplit Fib Head 1.4 8.0 57 >40  Poplit    4.2  4.2         Right Peroneal TA Motor (Tib Ant)  34.1C  Fib Head    3.1 <4.0 5.6 >4 Poplit Fib Head 2.1 9.0 43 >40  Poplit    5.2  5.2         Left Tibial Motor (Abd Hall Brev)  34.1C  Ankle    4.8 <6.0 6.7 >8 Knee Ankle 13.6 45.0 33 >40  Knee    18.4  4.4         Right Tibial Motor (Abd Hall Brev)  34.1C  Ankle    4.3 <6.0 9.6 >8 Knee Ankle 11.6 46.0 40 >40  Knee    15.9  5.0  H Reflex Studies   NR H-Lat (ms) Lat Norm (ms) L-R H-Lat (ms)  Left Tibial (Gastroc)  34.1C     40.00 <35 0.00  Right Tibial (Gastroc)  34.1C     40.00 <35 0.00   EMG   Side Muscle Ins Act Fibs Psw Fasc Number Recrt Dur Dur. Amp Amp. Poly Poly. Comment  Right AntTibialis Nml Nml Nml Nml Nml Nml Nml Nml Nml Nml Nml Nml N/A  Right Flex Dig Long Nml Nml Nml Nml 2- Rapid Some 1+ Some 1+ Nml Nml N/A  Right Gastroc Nml Nml Nml Nml Nml Nml Nml Nml Nml Nml Nml Nml N/A  Right GluteusMed Nml Nml Nml Nml Nml Nml Nml Nml Nml Nml Nml Nml N/A  Right RectFemoris Nml Nml Nml Nml Nml Nml Nml Nml Nml Nml Nml Nml N/A  Right BicepsFemS Nml Nml Nml Nml Nml Nml Nml Nml Nml Nml Nml Nml N/A  Left BicepsFemS Nml Nml Nml Nml Nml Nml Nml Nml Nml Nml Nml Nml N/A  Left AntTibialis Nml Nml Nml Nml Nml Nml Nml Nml Nml Nml Nml Nml N/A  Left Gastroc Nml Nml Nml Nml Nml Nml Nml Nml Nml Nml Nml Nml N/A  Left Flex Dig Long Nml Nml Nml Nml 2- Rapid Some 1+ Some 1+ Nml Nml N/A  Left RectFemoris Nml Nml Nml Nml Nml Nml Nml Nml Nml Nml Nml Nml N/A  Left GluteusMed Nml Nml Nml Nml Nml Nml Nml Nml Nml Nml Nml Nml N/A       Waveforms:

## 2017-02-07 ENCOUNTER — Telehealth: Payer: Self-pay | Admitting: *Deleted

## 2017-02-07 DIAGNOSIS — G629 Polyneuropathy, unspecified: Secondary | ICD-10-CM

## 2017-02-07 NOTE — Telephone Encounter (Signed)
Left message requesting call to explain testing results. I spoke with pt and informed of Dr. Gabriel RungWagoner's review of results and orders for consultation to Neurology. Pt states understanding.

## 2017-02-07 NOTE — Telephone Encounter (Signed)
-----   Message from Vivi BarrackMatthew R Wagoner, DPM sent at 02/07/2017  4:29 PM EST ----- Given his symptoms and the NCV does also show polyneuropathy can you please put in a referral for neurology? Also please let him know. Thank you.

## 2017-02-07 NOTE — Telephone Encounter (Signed)
Faxed referral, clinicals and demographics faxe to [facility].

## 2017-02-07 NOTE — Telephone Encounter (Signed)
Faxed referral, clinicals and demographics to [facility]. 

## 2017-02-10 ENCOUNTER — Encounter: Payer: Self-pay | Admitting: Neurology

## 2017-02-10 ENCOUNTER — Other Ambulatory Visit: Payer: Self-pay | Admitting: Family Medicine

## 2017-02-10 ENCOUNTER — Ambulatory Visit: Payer: Self-pay | Admitting: Family Medicine

## 2017-02-10 DIAGNOSIS — Z0289 Encounter for other administrative examinations: Secondary | ICD-10-CM

## 2017-02-10 NOTE — Telephone Encounter (Signed)
Last office visit 12/09/2016 with Dr. Patsy Lageropland.  Last refilled 12/19/2016 for #60 with 1 refill.  Ok to refill?

## 2017-02-12 NOTE — Progress Notes (Signed)
Referral in place

## 2017-02-14 ENCOUNTER — Other Ambulatory Visit: Payer: Self-pay | Admitting: Family Medicine

## 2017-02-26 ENCOUNTER — Other Ambulatory Visit: Payer: Self-pay | Admitting: Family Medicine

## 2017-03-17 ENCOUNTER — Other Ambulatory Visit: Payer: PRIVATE HEALTH INSURANCE

## 2017-03-17 ENCOUNTER — Telehealth: Payer: Self-pay | Admitting: Family Medicine

## 2017-03-17 DIAGNOSIS — E7800 Pure hypercholesterolemia, unspecified: Secondary | ICD-10-CM

## 2017-03-17 DIAGNOSIS — E559 Vitamin D deficiency, unspecified: Secondary | ICD-10-CM

## 2017-03-17 DIAGNOSIS — R7309 Other abnormal glucose: Secondary | ICD-10-CM

## 2017-03-17 NOTE — Telephone Encounter (Signed)
-----   Message from Alvina Chouerri J Walsh sent at 03/12/2017  9:35 AM EST ----- Regarding: Lab orders for Monday, 2.25.19 Patient is scheduled for CPX labs, please order future labs, Thanks , Camelia Engerri

## 2017-03-20 ENCOUNTER — Ambulatory Visit (INDEPENDENT_AMBULATORY_CARE_PROVIDER_SITE_OTHER): Payer: PRIVATE HEALTH INSURANCE | Admitting: Family Medicine

## 2017-03-20 ENCOUNTER — Encounter: Payer: Self-pay | Admitting: Family Medicine

## 2017-03-20 ENCOUNTER — Other Ambulatory Visit: Payer: Self-pay

## 2017-03-20 VITALS — BP 106/80 | HR 114 | Temp 97.5°F | Ht 76.0 in | Wt 256.5 lb

## 2017-03-20 DIAGNOSIS — R7309 Other abnormal glucose: Secondary | ICD-10-CM

## 2017-03-20 DIAGNOSIS — I10 Essential (primary) hypertension: Secondary | ICD-10-CM | POA: Diagnosis not present

## 2017-03-20 DIAGNOSIS — E7800 Pure hypercholesterolemia, unspecified: Secondary | ICD-10-CM | POA: Diagnosis not present

## 2017-03-20 DIAGNOSIS — F251 Schizoaffective disorder, depressive type: Secondary | ICD-10-CM | POA: Diagnosis not present

## 2017-03-20 DIAGNOSIS — F39 Unspecified mood [affective] disorder: Secondary | ICD-10-CM | POA: Diagnosis not present

## 2017-03-20 DIAGNOSIS — Z0000 Encounter for general adult medical examination without abnormal findings: Secondary | ICD-10-CM | POA: Diagnosis not present

## 2017-03-20 NOTE — Patient Instructions (Addendum)
Stop soda!  Fish oil 2000 mg divided daily.  Start exercsie as able.  Stop smoking!

## 2017-03-20 NOTE — Progress Notes (Signed)
   Subjective:    Patient ID: Jeffrey Lin, male    DOB: 05/18/1975, 42 y.o.   MRN: [id number]  HPI The patient is here for annual wellness exam and preventative care.    Hypertension:  Using metoprolol, lasix and losartan.   Using medication without problems or lightheadedness:  Chest pain with exertion:none Edema:none Short of breath:none Average home BPs: 128/85 Other issues:  Elevated Cholesterol: Du for re-eval Lab Results  Component Value Date   CHOL 191 03/18/2016   HDL 35.60 (L) 03/18/2016   LDLCALC 84 11/01/2015   LDLDIRECT 101.0 03/18/2016   TRIG 363.0 (H) 03/18/2016   CHOLHDL 5 03/18/2016  Using medications without problems: Muscle aches:  Diet compliance: poor Exercise: minimal Other complaints: 4x16 oz bottles of mountain dew.   prediabetes: A1C 6.0   Mood followed by psychiatry, Dr. Evelene Croonkaur He reports his mood is stable.   Blood pressure 106/80, pulse (!) 114, temperature (!) 97.5 F (36.4 C), temperature source Oral, height 6\' 4"  (1.93 m), weight 256 lb 8 oz (116.3 kg). Social History /Family History/Past Medical History reviewed in detail and updated in EMR if needed.  Review of Systems  Constitutional: Negative for fatigue and fever.  HENT: Negative for ear pain.   Eyes: Negative for pain.  Respiratory: Negative for cough and shortness of breath.   Cardiovascular: Negative for chest pain, palpitations and leg swelling.  Gastrointestinal: Negative for abdominal pain.  Genitourinary: Negative for dysuria.  Musculoskeletal: Negative for arthralgias.  Neurological: Negative for syncope, light-headedness and headaches.       Objective:   Physical Exam  Constitutional: Vital signs are normal. He appears well-developed and well-nourished.  Central obesity  HENT:  Head: Normocephalic.  Right Ear: Hearing normal.  Left Ear: Hearing normal.  Nose: Nose normal.  Mouth/Throat: Oropharynx is clear and moist and mucous membranes are normal.  Neck:  Trachea normal. Carotid bruit is not present. No thyroid mass and no thyromegaly present.  Cardiovascular: Normal rate, regular rhythm and normal pulses. Exam reveals no gallop, no distant heart sounds and no friction rub.  No murmur heard. 1 plus pitting edema bilaterally   Pulmonary/Chest: Effort normal and breath sounds normal. No respiratory distress.  Skin: Skin is warm, dry and intact. No rash noted.  Psychiatric: His speech is normal. Thought content normal. His affect is blunt. He is slowed and withdrawn.          Assessment & Plan:  The patient's preventative maintenance and recommended screening tests for an annual wellness exam were reviewed in full today. Brought up to date unless services declined.  Counselled on the importance of diet, exercise, and its role in overall health and mortality. The patient's FH and SH was reviewed, including their home life, tobacco status, and drug and alcohol status.   Vaccines: refused flu vaccine. Uptodate tdap. HIV: refused  No early family history of colon cancer/prostate. Smoker.. 1 pack per day

## 2017-03-31 ENCOUNTER — Other Ambulatory Visit: Payer: Self-pay | Admitting: Family Medicine

## 2017-03-31 NOTE — Telephone Encounter (Signed)
Last office visit 03/20/2017.  Last refilled 02/10/2017 for #60 with 1 refill.  Ok to refill?

## 2017-04-02 NOTE — Assessment & Plan Note (Signed)
Currently no psychotic symptoms on current regimen.

## 2017-04-02 NOTE — Assessment & Plan Note (Signed)
Moderate control.. Followed by psychiatry. 

## 2017-04-02 NOTE — Assessment & Plan Note (Signed)
Very high carb diet. Stop soda. Encouraged exercise, weight loss, healthy eating habits.

## 2017-04-02 NOTE — Assessment & Plan Note (Signed)
Well controlled. Continue current medication.  

## 2017-04-27 ENCOUNTER — Other Ambulatory Visit: Payer: Self-pay | Admitting: Family Medicine

## 2017-05-04 ENCOUNTER — Other Ambulatory Visit: Payer: Self-pay | Admitting: Family Medicine

## 2017-05-07 ENCOUNTER — Encounter: Payer: Self-pay | Admitting: Neurology

## 2017-05-07 ENCOUNTER — Other Ambulatory Visit: Payer: Self-pay | Admitting: *Deleted

## 2017-05-07 ENCOUNTER — Telehealth: Payer: Self-pay | Admitting: Neurology

## 2017-05-07 ENCOUNTER — Ambulatory Visit (INDEPENDENT_AMBULATORY_CARE_PROVIDER_SITE_OTHER): Payer: PRIVATE HEALTH INSURANCE | Admitting: Neurology

## 2017-05-07 ENCOUNTER — Other Ambulatory Visit (INDEPENDENT_AMBULATORY_CARE_PROVIDER_SITE_OTHER): Payer: PRIVATE HEALTH INSURANCE

## 2017-05-07 VITALS — BP 108/78 | HR 90 | Ht 76.0 in | Wt 255.5 lb

## 2017-05-07 DIAGNOSIS — G629 Polyneuropathy, unspecified: Secondary | ICD-10-CM

## 2017-05-07 DIAGNOSIS — E538 Deficiency of other specified B group vitamins: Secondary | ICD-10-CM | POA: Diagnosis not present

## 2017-05-07 DIAGNOSIS — R269 Unspecified abnormalities of gait and mobility: Secondary | ICD-10-CM

## 2017-05-07 DIAGNOSIS — M79604 Pain in right leg: Secondary | ICD-10-CM

## 2017-05-07 DIAGNOSIS — Z720 Tobacco use: Secondary | ICD-10-CM

## 2017-05-07 DIAGNOSIS — M79605 Pain in left leg: Secondary | ICD-10-CM

## 2017-05-07 LAB — SEDIMENTATION RATE: Sed Rate: 13 mm/hr (ref 0–15)

## 2017-05-07 LAB — C-REACTIVE PROTEIN: CRP: 0.4 mg/dL — ABNORMAL LOW (ref 0.5–20.0)

## 2017-05-07 MED ORDER — AMBULATORY NON FORMULARY MEDICATION: 1.0000 [IU] | Freq: Every day | 0 refills | Status: DC

## 2017-05-07 NOTE — Telephone Encounter (Signed)
Patient called and Lmom regarding Paper work and records to his Lawyer? Called patient to confirm what paper work needed but no answer. Thanks

## 2017-05-07 NOTE — Progress Notes (Signed)
[facility] Clinic Note - Initial Visit   Date: 05/07/17  Jeffrey Lin MRN: [id number] DOB: 1975/12/15   Dear Dr. Jacqualyn Posey:  Thank you for your kind referral of Jeffrey Lin for consultation of neuropathy. Although his history is well known to you, please allow [place] to reiterate it for the purpose of our medical record. The patient was accompanied to the clinic by mother who also provides collateral information.     History of Present Illness: Jeffrey Lin is a 42 y.o. right-handed Caucasian male with depression, hypertension, schizoaffective disorder, tobacco use, prediabetes, and anxiety presenting for evaluation of bilateral feet pain.    Starting around 2014, he began noticing sharp pain in the heel as well as firey cold sensation of the toes.  He also feels that there are compression stocking over his lower legs.  Symptoms are constant without identifying triggers or alleviating factors.  He takes gabapentin 1255m three times daily which provides about 50% relief in pain.  He endorses imbalance and fallen about 5 times over the past year.  He has not sustained injury with his falls.  He walks unassisted.  He has some weakness of the left foot.  He has chronic low back pain.  He was seeing Dr. WJacqualyn Posey podiatry, who referred him for electrodiagnostic testing of the legs.  These findings show chronic sensorimotor polyneuropathy, axon loss and demyelinating.  He is here for evaluation.  Recent labs show normal vitamin B12, TSH, and low folate.  He is not taking supplements for this.   He smokes 1 ppd for about 20 years.  He was previously drinking 6 pack over the weekend, but has quit this since 2014.   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   Out-side paper records, electronic medical record, and images have been reviewed where available and summarized as:  NCS/EMG of the legs 02/06/2017:    The electrophysiologic findings are most consistent with a chronic sensorimotor polyneuropathy, axon loss and demyelinating in type, affecting the lower extremities.   Labs 03/17/2017:  HbA1c 6.0, vitamin B12 312, folate 2.8*, TSH 1.50, vitamin D 93  CT head 09/09/2013:  Neg  Past Medical History:  Diagnosis Date  . Anxiety   . Chronic leg pain   . Depression   . Essential hypertension, benign 10/20/2015  . Schizoaffective disorder ([facility]     Past Surgical History:  Procedure Laterality Date  . Block procedure at pain center  03/27/06  . Bone graft from hip to jaw  2004  . Bone graft right side of head to jaw  2006  . 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 Sphenopalatine ganglionic       Medications:  Outpatient Encounter Medications as of 05/07/2017  Medication Sig Note  . amantadine (SYMMETREL) 100 MG capsule Take 100 mg by mouth 2 (two) times daily.   .Marland Kitchenatorvastatin (LIPITOR) 40 MG tablet TAKE 1 TABLET BY MOUTH EVERY DAY   . benztropine (COGENTIN) 1 MG tablet Take 1 mg by mouth at bedtime.    . diazepam (VALIUM) 10 MG tablet Take 10 mg by mouth 4 (four) times daily.    .Marland KitchenFLUoxetine (PROZAC) 40 MG capsule Take 40 mg by mouth daily.   . furosemide (LASIX) 20 MG tablet TAKE 1 TABLET BY MOUTH EVERY DAY   . gabapentin (NEURONTIN) 600 MG tablet Take 600-1,200 mg by mouth 3 (three)  times daily as needed.    . hydrOXYzine (VISTARIL) 25 MG capsule TAKE 1 CAPSULE BY MOUTH 4 TIMES A DAY   . KLOR-CON 10 10 MEQ tablet TAKE 1 TABLET BY MOUTH EVERY DAY   . losartan (COZAAR) 100 MG tablet TAKE 1 TABLET (100 MG TOTAL) BY MOUTH DAILY.   . methocarbamol (ROBAXIN) 750 MG tablet TAKE 1 TABLET (750 MG TOTAL) BY MOUTH EVERY 12 (TWELVE) HOURS AS NEEDED FOR MUSCLE SPASMS.   . metoprolol succinate (TOPROL-XL) 50 MG 24 hr tablet Take 1 tablet (50 mg total) by mouth daily. Take with or immediately following a meal.   . omeprazole (PRILOSEC) 20 MG capsule Take 20 mg by mouth daily as needed.  04/09/2016: Only does when he eats certain foods  . Vitamin D, Ergocalciferol, (DRISDOL) 50000 units CAPS capsule Take 1 capsule (50,000 Units total) by mouth 2 (two) times a week.   . AMBULATORY NON FORMULARY MEDICATION 1 Units by Other route daily. Quad cane   . OLANZapine (ZYPREXA) 10 MG tablet TAKE 1 TABLET BY MOUTH AT BEDTIME (REPLACES SYMBYAX)    No facility-administered encounter medications on file as of 05/07/2017.      Allergies:  Allergies  Allergen Reactions  . Toradol [Ketorolac Tromethamine] Swelling  . Lamotrigine Rash    Family History: Family History  Problem Relation Age of Onset  . Hyperlipidemia Mother   . Hypertension Mother   . Depression Mother   . Anxiety disorder Mother   . Depression Brother   . Anxiety disorder Brother   . Bipolar disorder Maternal Grandfather   . Schizophrenia Maternal Grandfather   . Bipolar disorder Paternal Grandfather     Social History: Social History   Tobacco Use  . Smoking status: Current Every Day Smoker    Packs/day: 0.50    Years: 10.00    Pack years: 5.00    Types: Cigarettes    Start date: 09/22/1995  . Smokeless tobacco: Former Network engineer Use Topics  . Alcohol use: No    Alcohol/week: 0.0 oz  . Drug use: No   Social History   Social History Narrative      Exercise:  None, running causes head pain   Diet:  FF once daily, + fruits and veggies, + H2O, + Soda   Lives with mom in a one story home.  No children.  Currently not working.  Trying to get disability.  Education: college.     Review of Systems:  CONSTITUTIONAL: No fevers, chills, night sweats, or weight loss.   EYES: No visual changes or eye pain ENT: No hearing changes.  No history of nose bleeds.   RESPIRATORY: No cough, wheezing and shortness of breath.   CARDIOVASCULAR: Negative for chest pain, and palpitations.   GI: Negative for abdominal discomfort, blood in stools or black stools.  No recent change in bowel habits.   GU:  No history  of incontinence.   MUSCLOSKELETAL: No history of joint pain or swelling.  No myalgias.   SKIN: Negative for lesions, rash, and itching.   HEMATOLOGY/ONCOLOGY: Negative for prolonged bleeding, bruising easily, and swollen nodes.  No history of cancer.   ENDOCRINE: Negative for cold or heat intolerance, polydipsia or goiter.   PSYCH:  +depression +anxiety symptoms.   NEURO: As Above.   Vital Signs:  BP 108/78   Pulse 90   Ht 6' 4"  (1.93 m)   Wt 255 lb 8 oz (115.9 kg)   SpO2 90%   BMI 31.10 kg/m  General Medical Exam:   General:  Well appearing, strong tobacco odor, comfortable.   Eyes/ENT: see cranial nerve examination.   Neck: No masses appreciated.  Full range of motion without tenderness.  No carotid bruits. Respiratory:  Clear to auscultation, good air entry bilaterally.   Cardiac:  Regular rate and rhythm, no murmur.   Extremities:  No deformities, edema, or skin discoloration.  Skin:  No rashes or lesions. Heavily tattoed  Neurological Exam: MENTAL STATUS including orientation to time, place, person, recent and remote memory, attention span and concentration, language, and fund of knowledge is normal.  Speech is not dysarthric.  CRANIAL NERVES: II:  No visual field defects.  Unremarkable fundi.   III-IV-VI: Pupils equal round and reactive to light.  Normal conjugate, extra-ocular eye movements in all directions of gaze.  No nystagmus.  No ptosis.   V:  Normal facial sensation.     VII:  Normal facial symmetry and movements.   VIII:  Normal hearing and vestibular function.   IX-X:  Normal palatal movement.   XI:  Normal shoulder shrug and head rotation.   XII:  Normal tongue strength and range of motion, no deviation or fasciculation.  MOTOR:  No atrophy, fasciculations or abnormal movements.  No pronator drift.  Tone is normal.    Right Upper Extremity:    Left Upper Extremity:    Deltoid  5/5   Deltoid  5/5   Biceps  5/5   Biceps  5/5   Triceps  5/5   Triceps  5/5    Wrist extensors  5/5   Wrist extensors  5/5   Wrist flexors  5/5   Wrist flexors  5/5   Finger extensors  5/5   Finger extensors  5/5   Finger flexors  5/5   Finger flexors  5/5   Dorsal interossei  5/5   Dorsal interossei  5/5   Abductor pollicis  5/5   Abductor pollicis  5/5   Tone (Ashworth scale)  0  Tone (Ashworth scale)  0   Right Lower Extremity:    Left Lower Extremity:    Hip flexors  5/5   Hip flexors  5/5   Hip extensors  5/5   Hip extensors  5/5   Knee flexors  5/5   Knee flexors  5/5   Knee extensors  5/5   Knee extensors  5/5   Dorsiflexors  5/5   Dorsiflexors  5-/5   Plantarflexors  5/5   Plantarflexors  5-/5   Toe extensors  5/5   Toe extensors  5-/5   Toe flexors  5/5   Toe flexors  5-/5   Tone (Ashworth scale)  0  Tone (Ashworth scale)  0   MSRs:  Right                                                                 Left brachioradialis 2+  brachioradialis 2+  biceps 2+  biceps 2+  triceps 2+  triceps 2+  patellar 2+  patellar 2+  ankle jerk 0  ankle jerk 0  Hoffman no  Hoffman no  plantar response down  plantar response down   SENSORY:  Light touch, temperature, and pin prick is reduced distal to ankles bilaterally.  There  is mild sway with Rhomberg testing  COORDINATION/GAIT: Normal finger-to- nose-finger.  Finger tapping is reduced bilaterally.  Gait appears ataxic, wide-based, and unsteady at times   IMPRESSION: 1.  Peripheral neuropathy affecting the lower extremities, axonal and demyelinating features present on NCS.  He is young to have symptoms of neuropathy without other risk factors such as diabetes, alcohol abuse, or family history of neuropathy.  His recent labs have shown folate deficiency and I recommend that he start folic acid 38m daily. Additional labs will be check for secondary causes of neuropathy including:  ESR, CRP, MMA, vitamin B1, copper, SPEP with IFE, heavy metal screen.  Further, with his demyelinating changes and asymmetrical  involvement, I will also order spinal tap to evaluate for CIDP.  Check CSF cell count and diff, protein, glucose, IgG index, oligoclonal bands, myelin basic protein, flow cytology, CSF ACE, CSF lyme PCR Start physical therapy for gait training.  Recommend using a cane  2.  Tobacco abuse.  Currently smoking 1 packs/day.  Patient was informed of the dangers of tobacco abuse including stroke, cancer, and MI, as well as benefits of tobacco cessation.  Patient is interested in trying to quit at this time.  Approximately 5 mins were spent counseling patient cessation techniques. We discussed various methods to help quit smoking, including deciding on a date to quit, joining a support group, pharmacological agents- nicotine gum/patch/lozenges, chantix. I will reassess his progress at the next follow-up visit  Return to clinic after testing   Thank you for allowing me to participate in patient's care.  If I can answer any additional questions, I would be pleased to do so.    Sincerely,    Eleanor Dimichele K. PPosey Pronto DO

## 2017-05-07 NOTE — Progress Notes (Signed)
non

## 2017-05-07 NOTE — Patient Instructions (Addendum)
Check labs  Lumbar puncture  Encouraged to stop smoking  Start physical therapy for balance  Stop smoking  We will call you with the results

## 2017-05-11 LAB — HEAVY METALS PANEL, BLOOD
Arsenic: <10 mcg/L (ref <23)
Lead: <1 ug/dL (ref <5)
Mercury, B: <5 mcg/L (ref 0–10)

## 2017-05-11 LAB — PROTEIN ELECTROPHORESIS, SERUM
ALBUMIN ELP: 4.1 g/dL (ref 3.8–4.8)
ALPHA 1: 0.4 g/dL — AB (ref 0.2–0.3)
ALPHA 2: 0.7 g/dL (ref 0.5–0.9)
Beta 2: 0.5 g/dL (ref 0.2–0.5)
Beta Globulin: 0.5 g/dL (ref 0.4–0.6)
Gamma Globulin: 0.7 g/dL — ABNORMAL LOW (ref 0.8–1.7)
TOTAL PROTEIN: 6.9 g/dL (ref 6.1–8.1)

## 2017-05-11 LAB — IMMUNOFIXATION ELECTROPHORESIS
IMMUNOFIX ELECTR INT: NOT DETECTED
IgG (Immunoglobin G), Serum: 671 mg/dL — ABNORMAL LOW (ref 694–1618)
IgM, Serum: 167 mg/dL (ref 48–271)
Immunoglobulin A: 383 mg/dL (ref 81–463)

## 2017-05-11 LAB — VITAMIN B1: Vitamin B1 (Thiamine): <6 nmol/L — ABNORMAL LOW (ref 8–30)

## 2017-05-11 LAB — COPPER, SERUM: COPPER: 107 ug/dL (ref 70–175)

## 2017-05-11 LAB — METHYLMALONIC ACID, SERUM: Methylmalonic Acid, Quant: 325 nmol/L — ABNORMAL HIGH (ref 87–318)

## 2017-05-12 ENCOUNTER — Ambulatory Visit: Payer: Self-pay | Admitting: Neurology

## 2017-05-13 ENCOUNTER — Telehealth: Payer: Self-pay | Admitting: *Deleted

## 2017-05-13 NOTE — Telephone Encounter (Signed)
-----   Message from [place] Chardonika K Patel, DO sent at 05/12/2017  9:19 AM EDT ----- Please inform patient that his labs show low vitamin B1 and B12  - start vitamin B12 1000mcg daily and vitamin B1 100mg  daily.  The rest of the labs look good. Thanks.

## 2017-05-13 NOTE — Telephone Encounter (Signed)
Patient given results and instructions.  Instructions repeated back to me.

## 2017-05-14 ENCOUNTER — Other Ambulatory Visit (HOSPITAL_COMMUNITY)
Admission: RE | Admit: 2017-05-14 | Discharge: 2017-05-14 | Disposition: A | Discharge status: Home / Self Care | Payer: No Typology Code available for payment source | Source: Ambulatory Visit | Attending: Neurology | Admitting: Neurology

## 2017-05-14 ENCOUNTER — Telehealth: Payer: Self-pay | Admitting: Neurology

## 2017-05-14 ENCOUNTER — Ambulatory Visit
Admission: RE | Admit: 2017-05-14 | Discharge: 2017-05-14 | Disposition: A | Discharge status: Home / Self Care | Payer: PRIVATE HEALTH INSURANCE | Source: Ambulatory Visit | Attending: Neurology | Admitting: Neurology

## 2017-05-14 VITALS — BP 110/76 | HR 83

## 2017-05-14 DIAGNOSIS — R269 Unspecified abnormalities of gait and mobility: Secondary | ICD-10-CM

## 2017-05-14 DIAGNOSIS — M79604 Pain in right leg: Secondary | ICD-10-CM | POA: Insufficient documentation

## 2017-05-14 DIAGNOSIS — G629 Polyneuropathy, unspecified: Secondary | ICD-10-CM | POA: Diagnosis not present

## 2017-05-14 DIAGNOSIS — M79605 Pain in left leg: Secondary | ICD-10-CM | POA: Insufficient documentation

## 2017-05-14 LAB — CSF CELL COUNT WITH DIFFERENTIAL
RBC COUNT CSF: 0 {cells}/uL (ref 0–10)
WBC CSF: 1 {cells}/uL (ref 0–5)

## 2017-05-14 LAB — PROTEIN, CSF: TOTAL PROTEIN, CSF: 71 mg/dL — AB (ref 15–45)

## 2017-05-14 LAB — GLUCOSE, CSF: Glucose, CSF: 69 mg/dL (ref 40–80)

## 2017-05-14 NOTE — Progress Notes (Signed)
[facility] for [facility] without [facility]. Pt tolerated procedure well. Site Unremarkable.

## 2017-05-14 NOTE — Telephone Encounter (Signed)
Dr Patel aware

## 2017-05-14 NOTE — Discharge Instructions (Signed)

## 2017-05-14 NOTE — Telephone Encounter (Signed)
Dorothy called with STAT results on this patient. The Reference # is [id number] F. They are faxing a copy over as well. Thanks

## 2017-05-16 LAB — ANGIOTENSIN CONVERTING ENZYME, CSF: ACE, CSF: 6 U/L (ref <=15)

## 2017-05-16 LAB — BORRELIA SPECIES DNA, FLUID, PCR: BBURGDNAFLU: NOT DETECTED

## 2017-05-17 LAB — CNS IGG SYNTHESIS RATE, CSF+BLOOD
ALBUMIN SERUM: 4.5 g/dL (ref 3.5–5.2)
Albumin, CSF: 42 mg/dL (ref 8.0–42.0)
CNS-IGG SYNTHESIS RATE: 2.8 mg/(24.h) (ref <=3.3)
IGG (IMMUNOGLOBIN G), SERUM: 710 mg/dL (ref 694–1618)
IgG Total CSF: 4 mg/dL (ref 0.8–7.7)
IgG-Index: 0.6 (ref <0.66)

## 2017-05-19 ENCOUNTER — Other Ambulatory Visit: Payer: Self-pay | Admitting: Family Medicine

## 2017-05-19 LAB — OLIGOCLONAL BANDS, CSF + SERM

## 2017-05-19 LAB — MYELIN BASIC PROTEIN, CSF

## 2017-05-19 NOTE — Telephone Encounter (Signed)
Last office visit 03/20/2017.  Last refilled 04/01/2017 for #60 with 1 refill.  Ok to refill?

## 2017-05-20 ENCOUNTER — Telehealth: Payer: Self-pay | Admitting: Neurology

## 2017-05-20 NOTE — Telephone Encounter (Signed)
Call patients with the results of his spinal fluid analysis which shows elevated protein and normal cell count. Along with his EMG findings of demyelinating neuropathy, this is most consistent with CIDP. I discussed treatment options including steroids or IVIG infusions.  After discussing the risks and benefits of each, we decided to pursue home IVIG infusions 2g/kg over 5 days and will start the paperwork for this.  Endrit Gittins K. Allena Katz, DO

## 2017-05-21 NOTE — Telephone Encounter (Signed)
Order sent to [facility].

## 2017-05-30 ENCOUNTER — Telehealth: Payer: Self-pay | Admitting: Family Medicine

## 2017-05-30 NOTE — Telephone Encounter (Signed)
Spoke with [place].  He is just unclear why he is suppose to be taking potassium and it is on his medication list (Klor-Con 10 meq) daily.  I advised this was prescribed for him when he was giving lasix for the swelling he was having back in 2018.  I advised that Lasix is a fluid pill and and can deplete his potassium level so he is suppose to take a potassium pill whenever he is taking the lasix.  Patient states understanding.

## 2017-05-30 NOTE — Telephone Encounter (Signed)
Copied from CRM [id number]. Topic: Quick Communication - See Telephone Encounter >> May 30, 2017  9:35 AM Cipriano Bunker wrote: CRM for notification. See Telephone encounter for: 05/30/17.  Pt. Is asking about a potassium prescription from Dr. Ermalene Searing filled on 05/28/17 (not seeing in med list)   He wanted to know what his potassium level is and if should still take.

## 2017-05-30 NOTE — Telephone Encounter (Signed)
Agreed -

## 2017-06-19 ENCOUNTER — Other Ambulatory Visit: Payer: Self-pay | Admitting: Family Medicine

## 2017-06-26 ENCOUNTER — Ambulatory Visit (INDEPENDENT_AMBULATORY_CARE_PROVIDER_SITE_OTHER): Payer: PRIVATE HEALTH INSURANCE | Admitting: Podiatry

## 2017-06-26 ENCOUNTER — Encounter: Payer: Self-pay | Admitting: Podiatry

## 2017-06-26 DIAGNOSIS — M7661 Achilles tendinitis, right leg: Secondary | ICD-10-CM

## 2017-06-26 DIAGNOSIS — B351 Tinea unguium: Secondary | ICD-10-CM | POA: Diagnosis not present

## 2017-06-26 DIAGNOSIS — M79674 Pain in right toe(s): Secondary | ICD-10-CM | POA: Diagnosis not present

## 2017-06-26 DIAGNOSIS — M7742 Metatarsalgia, left foot: Secondary | ICD-10-CM | POA: Diagnosis not present

## 2017-06-26 DIAGNOSIS — M7741 Metatarsalgia, right foot: Secondary | ICD-10-CM | POA: Diagnosis not present

## 2017-06-26 DIAGNOSIS — M79675 Pain in left toe(s): Secondary | ICD-10-CM | POA: Diagnosis not present

## 2017-06-26 MED ORDER — DICLOFENAC SODIUM 1 % TD GEL: 2.0000 g | Freq: Four times a day (QID) | TRANSDERMAL | 2 refills | Status: DC

## 2017-06-26 NOTE — Patient Instructions (Signed)

## 2017-06-29 NOTE — Progress Notes (Signed)
Subjective: 42 year old male presents the office today with concerns of thick, painful, elongated toenails he cannot trim himself.  He also gets pain along the right Achilles tendon at times and he states that hurts to bend his ankle at times.  Denies any recent injury or trauma denies any swelling or redness.  He also gets pain to the balls of his feet he feels he is walking on golf balls.  Again no recent injury no recent treatment.  No other concerns. Denies any systemic complaints such as fevers, chills, nausea, vomiting. No acute changes since last appointment, and no other complaints at this time.   Objective: AAO x3, NAD DP/PT pulses palpable bilaterally, CRT less than 3 seconds Nails are hypertrophic, dystrophic, brittle, discolored, elongated 10. No surrounding redness or drainage. Tenderness nails 1-5 bilaterally. No open lesions or pre-ulcerative lesions are identified today. Prominent metatarsal head plantarly with atrophy of the fat pad.  Diffuse tenderness submetatarsal 1-5 bilaterally without any specific area pinpoint tenderness. Tenderness along the right insertion of the Achilles tendon.  Thompson test is negative and Achilles tendon appears to be intact.  No edema, erythema, increase in warmth.  No pain with lateral compression of calcaneus.  Equinus is present. No open lesions or pre-ulcerative lesions.  No pain with calf compression, swelling, warmth, erythema  Assessment: 42 year old male presents with symptomatic onychomycosis, Achilles tendinitis, metatarsalgia  Plan: -All treatment options discussed with the patient including all alternatives, risks, complications.  -Nails debrided x10 without any complications or bleeding. -Metatarsal pads dispensed -Discussed stretching, icing exercises for Achilles tendon.  Heel lifts dispensed.  Discussed shoe modifications. -Patient encouraged to call the office with any questions, concerns, change in symptoms.   Vivi BarrackMatthew R Rhianne Soman  DPM

## 2017-07-08 ENCOUNTER — Other Ambulatory Visit: Payer: Self-pay | Admitting: Family Medicine

## 2017-07-08 NOTE — Telephone Encounter (Signed)
Last office visit 03/20/2017.  Last refilled 05/20/2017 for #60 with 1 refills.  Ok to refill?

## 2017-07-21 ENCOUNTER — Telehealth: Payer: Self-pay | Admitting: Neurology

## 2017-07-21 NOTE — Telephone Encounter (Signed)
[name] left message for Morrie Sheldonshley on the VM stating she needs to talk to her about Reita ClicheBobby please call

## 2017-07-21 NOTE — Telephone Encounter (Signed)
Left message for Mrs. Roseanne RenoStewart to call me back.

## 2017-07-21 NOTE — Telephone Encounter (Signed)
Mrs Jeffrey Lin  Returned your call and left message on VM

## 2017-07-23 ENCOUNTER — Other Ambulatory Visit: Payer: Self-pay | Admitting: *Deleted

## 2017-07-23 ENCOUNTER — Other Ambulatory Visit: Payer: Self-pay | Admitting: Family Medicine

## 2017-07-23 DIAGNOSIS — G629 Polyneuropathy, unspecified: Secondary | ICD-10-CM

## 2017-07-23 NOTE — Telephone Encounter (Signed)
We can do Solumedrol 1g x 5 days at the hospital.  He will need to monitor blood sugars closely, as steroids can increase his glucose levels.  He is not taking any medications for diabetes.  Other potential side effects include hypertension, mood changes, increased appetite, acid reflux, and insomnia but this is only during the period of his infusions.  He can take pepcid 20-40mg  daily for acid reflux to prevent heartburn.  Donika K. Allena KatzPatel, DO

## 2017-07-23 NOTE — Telephone Encounter (Signed)
I spoke with Mrs. Jeffrey Lin and informed her that the infusions will start on July 15 at 10:00.

## 2017-07-23 NOTE — Telephone Encounter (Signed)
Last office visit 03/20/2017 for CPE.  Last refilled 07/08/2017 for #60 with 1 refill.  Ok to refill?

## 2017-07-23 NOTE — Telephone Encounter (Signed)
I spoke with patient's mom and she said that they can't afford the infusions.  ($6000/month)  He does not qualify for medicare/medicaid.  Please advise.

## 2017-08-04 ENCOUNTER — Ambulatory Visit (HOSPITAL_COMMUNITY)
Admission: RE | Admit: 2017-08-04 | Discharge: 2017-08-04 | Disposition: A | Discharge status: Home / Self Care | Payer: PRIVATE HEALTH INSURANCE | Source: Ambulatory Visit | Attending: Neurology | Admitting: Neurology

## 2017-08-04 DIAGNOSIS — G629 Polyneuropathy, unspecified: Secondary | ICD-10-CM | POA: Diagnosis not present

## 2017-08-04 MED ORDER — SODIUM CHLORIDE 0.9 % IV SOLN
1000.0000 mg | Freq: Once | INTRAVENOUS | Status: AC
  Administered 2017-08-04: 1000 mg via INTRAVENOUS
  Filled 2017-08-04: qty 8

## 2017-08-04 MED ORDER — METHYLPREDNISOLONE SODIUM SUCC 1000 MG IJ SOLR: 1000.0000 mg | Freq: Every day | INTRAMUSCULAR | Status: DC

## 2017-08-04 NOTE — Discharge Instructions (Signed)
Methylprednisolone Solution for Injection °What is this medicine? °METHYLPREDNISOLONE (meth ill pred NISS oh lone) is a corticosteroid. It is commonly used to treat inflammation of the skin, joints, lungs, and other organs. Common conditions treated include asthma, allergies, and arthritis. It is also used for other conditions, such as blood disorders and diseases of the adrenal glands. °This medicine may be used for other purposes; ask your health care provider or pharmacist if you have questions. °COMMON BRAND NAME(S): A-Methapred, Solu-Medrol °What should I tell my health care provider before I take this medicine? °They need to know if you have any of these conditions: °-Cushing's syndrome °-eye disease, vision problems °-diabetes °-glaucoma °-heart disease °-high blood pressure °-infection (especially a virus infection such as chickenpox, cold sores, or herpes) °-liver disease °-mental illness °-myasthenia gravis °-osteoporosis °-recently received or scheduled to receive a vaccine °-seizures °-stomach or intestine problems °-thyroid disease °-an unusual or allergic reaction to lactose, methylprednisolone, other medicines, foods, dyes, or preservatives °-pregnant or trying to get pregnant °-breast-feeding °How should I use this medicine? °This medicine is for injection or infusion into a vein. It is also for injection into a muscle. It is given by a health care professional in a hospital or clinic setting. °Talk to your pediatrician regarding the use of this medicine in children. While this drug may be prescribed for selected conditions, precautions do apply. °Overdosage: If you think you have taken too much of this medicine contact a poison control center or emergency room at once. °NOTE: This medicine is only for you. Do not share this medicine with others. °What if I miss a dose? °This does not apply. °What may interact with this medicine? °Do not take this medicine with any of the following  medications: °-alefacept °-echinacea °-iopamidol °-live virus vaccines °-metyrapone °-mifepristone °This medicine may also interact with the following medications: °-amphotericin B °-aspirin and aspirin-like medicines °-certain antibiotics like erythromycin, clarithromycin, troleandomycin °-certain medicines for diabetes °-certain medicines for fungal infection like ketoconazole °-certain medicines for seizures like carbamazepine, phenobarbital, phenytoin °-certain medicines that treat or prevent blood clots like warfarin °-cyclosporine °-digoxin °-diuretics °-male hormones, like estrogens and birth control pills °-isoniazid °-NSAIDS, medicines for pain and inflammation, like ibuprofen or naproxen °-other medicines for myasthenia gravis °-rifampin °-vaccines °This list may not describe all possible interactions. Give your health care provider a list of all the medicines, herbs, non-prescription drugs, or dietary supplements you use. Also tell them if you smoke, drink alcohol, or use illegal drugs. Some items may interact with your medicine. °What should I watch for while using this medicine? °Tell your doctor or healthcare professional if your symptoms do not start to get better or if they get worse. Do not stop taking except on your doctor's advice. You may develop a severe reaction. Your doctor will tell you how much medicine to take. °Your condition will be monitored carefully while you are receiving this medicine. °This medicine may increase your risk of getting an infection. Tell your doctor or health care professional if you are around anyone with measles or chickenpox, or if you develop sores or blisters that do not heal properly. °This medicine may affect blood sugar levels. If you have diabetes, check with your doctor or health care professional before you change your diet or the dose of your diabetic medicine. °Tell your doctor or health care professional right away if you have any change in your  eyesight. °Using this medicine for a long time may increase your risk of low bone   mass. Talk to your doctor about bone health. °What side effects may I notice from receiving this medicine? °Side effects that you should report to your doctor or health care professional as soon as possible: °-allergic reactions like skin rash, itching or hives, swelling of the face, lips, or tongue °-bloody or tarry stools °-changes in vision °-hallucination, loss of contact with reality °-muscle cramps °-muscle pain °-palpitations °-signs and symptoms of high blood sugar such as dizziness; dry mouth; dry skin; fruity breath; nausea; stomach pain; increased hunger or thirst; increased urination °-signs and symptoms of infection like fever or chills; cough; sore throat; pain or trouble passing urine °-trouble passing urine or change in the amount of urine °Side effects that usually do not require medical attention (report to your doctor or health care professional if they continue or are bothersome): °-changes in emotions or mood °-constipation °-diarrhea °-excessive hair growth on the face or body °-headache °-nausea, vomiting °-pain, redness, or irritation at site where injected °-trouble sleeping °-weight gain °This list may not describe all possible side effects. Call your doctor for medical advice about side effects. You may report side effects to FDA at 1-800-FDA-1088. °Where should I keep my medicine? °This drug is given in a hospital or clinic and will not be stored at home. °NOTE: This sheet is a summary. It may not cover all possible information. If you have questions about this medicine, talk to your doctor, pharmacist, or health care provider. °© 2018 [employer] (2015-03-16 16:21:28) ° °

## 2017-08-05 ENCOUNTER — Ambulatory Visit (HOSPITAL_COMMUNITY)
Admission: RE | Admit: 2017-08-05 | Discharge: 2017-08-05 | Disposition: A | Discharge status: Home / Self Care | Payer: PRIVATE HEALTH INSURANCE | Source: Ambulatory Visit | Attending: Neurology | Admitting: Neurology

## 2017-08-05 DIAGNOSIS — G629 Polyneuropathy, unspecified: Secondary | ICD-10-CM | POA: Diagnosis not present

## 2017-08-05 MED ORDER — SODIUM CHLORIDE 0.9 % IV SOLN
1000.0000 mg | INTRAVENOUS | Status: DC
  Administered 2017-08-05: 1000 mg via INTRAVENOUS
  Filled 2017-08-05: qty 8

## 2017-08-06 ENCOUNTER — Ambulatory Visit (HOSPITAL_COMMUNITY)
Admission: RE | Admit: 2017-08-06 | Discharge: 2017-08-06 | Disposition: A | Discharge status: Home / Self Care | Payer: PRIVATE HEALTH INSURANCE | Source: Ambulatory Visit | Attending: Neurology | Admitting: Neurology

## 2017-08-06 DIAGNOSIS — G629 Polyneuropathy, unspecified: Secondary | ICD-10-CM | POA: Diagnosis not present

## 2017-08-06 MED ORDER — SODIUM CHLORIDE 0.9 % IV SOLN
1000.0000 mg | INTRAVENOUS | Status: DC
  Administered 2017-08-06: 1000 mg via INTRAVENOUS
  Filled 2017-08-06: qty 8

## 2017-08-07 ENCOUNTER — Ambulatory Visit (HOSPITAL_COMMUNITY)
Admission: RE | Admit: 2017-08-07 | Discharge: 2017-08-07 | Disposition: A | Discharge status: Home / Self Care | Payer: PRIVATE HEALTH INSURANCE | Source: Ambulatory Visit | Attending: Neurology | Admitting: Neurology

## 2017-08-07 DIAGNOSIS — G629 Polyneuropathy, unspecified: Secondary | ICD-10-CM | POA: Diagnosis not present

## 2017-08-07 MED ORDER — METHYLPREDNISOLONE SODIUM SUCC 1000 MG IJ SOLR
1000.0000 mg | INTRAMUSCULAR | Status: DC
  Administered 2017-08-07: 1000 mg via INTRAVENOUS
  Filled 2017-08-07: qty 8

## 2017-08-08 ENCOUNTER — Ambulatory Visit (HOSPITAL_COMMUNITY)
Admission: RE | Admit: 2017-08-08 | Discharge: 2017-08-08 | Disposition: A | Discharge status: Home / Self Care | Payer: PRIVATE HEALTH INSURANCE | Source: Ambulatory Visit | Attending: Neurology | Admitting: Neurology

## 2017-08-08 DIAGNOSIS — G629 Polyneuropathy, unspecified: Secondary | ICD-10-CM | POA: Diagnosis not present

## 2017-08-08 MED ORDER — SODIUM CHLORIDE 0.9 % IV SOLN
1000.0000 mg | INTRAVENOUS | Status: AC
  Administered 2017-08-08: 1000 mg via INTRAVENOUS
  Filled 2017-08-08: qty 8

## 2017-08-13 ENCOUNTER — Telehealth: Payer: Self-pay | Admitting: Neurology

## 2017-08-13 NOTE — Telephone Encounter (Signed)
Pt mother [name] wants to talk to someone about the injections not working for patient

## 2017-08-14 NOTE — Telephone Encounter (Signed)
I spoke with Jeffrey Lin and gave her the information and appointment time and date.

## 2017-08-14 NOTE — Telephone Encounter (Signed)
He had solumedrol injections last week and it is too early to see any benefit it can take months to see stabilization.  If he tolerated the infusions, plan to repeat in 1 month IVMP 1g every 28 days.  OK to add on 8/13 at 3:30p.

## 2017-08-14 NOTE — Telephone Encounter (Signed)
I requested for Jeffrey Lin to put him on our waiting list to be seen.  Any suggestions?

## 2017-08-21 ENCOUNTER — Other Ambulatory Visit: Payer: Self-pay | Admitting: Family Medicine

## 2017-09-02 ENCOUNTER — Encounter: Payer: Self-pay | Admitting: Neurology

## 2017-09-02 ENCOUNTER — Ambulatory Visit (INDEPENDENT_AMBULATORY_CARE_PROVIDER_SITE_OTHER): Payer: PRIVATE HEALTH INSURANCE | Admitting: Neurology

## 2017-09-02 VITALS — BP 104/60 | HR 118 | Ht 76.0 in | Wt 260.0 lb

## 2017-09-02 DIAGNOSIS — Z720 Tobacco use: Secondary | ICD-10-CM | POA: Diagnosis not present

## 2017-09-02 DIAGNOSIS — E519 Thiamine deficiency, unspecified: Secondary | ICD-10-CM | POA: Diagnosis not present

## 2017-09-02 DIAGNOSIS — G629 Polyneuropathy, unspecified: Secondary | ICD-10-CM | POA: Diagnosis not present

## 2017-09-02 DIAGNOSIS — G6181 Chronic inflammatory demyelinating polyneuritis: Secondary | ICD-10-CM | POA: Diagnosis not present

## 2017-09-02 DIAGNOSIS — E538 Deficiency of other specified B group vitamins: Secondary | ICD-10-CM | POA: Diagnosis not present

## 2017-09-02 NOTE — Patient Instructions (Addendum)
Start monthly steroid injections  Continue to try to cut back on smoking  Check labs  Return to clinic in 6 month

## 2017-09-02 NOTE — Progress Notes (Signed)
Follow-up Visit   Date: 09/02/17    Jeffrey Lin MRN: [id number] DOB: 1975/04/23   Interim History: Jeffrey Lin is a 42 y.o. right-handed Caucasian male with depression, hypertension, schizoaffective disorder, tobacco use, prediabetes, and anxiety returning to the clinic for follow-up of CIDP.  The patient was accompanied to the clinic by self.  History of present illness: Starting around 2014, he began noticing sharp pain in the heel as well as firey cold sensation of the toes.  He also feels that there are compression stocking over his lower legs.  Symptoms are constant without identifying triggers or alleviating factors.  He takes gabapentin 1255m three times daily which provides about 50% relief in pain.  He endorses imbalance and fallen about 5 times over the past year.  He has not sustained injury with his falls.  He walks unassisted.  He has some weakness of the left foot.  He has chronic low back pain.  He was seeing Dr. WJacqualyn Posey podiatry, who referred him for electrodiagnostic testing of the legs.  These findings show chronic sensorimotor polyneuropathy, axon loss and demyelinating.  He is here for evaluation.  Recent labs show normal vitamin B12, TSH, and low folate.  He is not taking supplements for this.   He smokes 1 ppd for about 20 years.  He was previously drinking 6 pack over the weekend, but has quit this since 2014.   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   UPDATE 09/02/2017:  He is here for follow-up visit.  He was treated with 5-days IV solumedrol 1g for probable CIDP as there was albuminocytologic dissociation on CSF and given the presence of demyelinating features on NCS.  He had not appreciated any change symptoms and continues to have severe pain of the feet for which he takes gabapentin 6072mTID.  Symptoms have not progressed, specifically, no new weakness.  He was also found to have  folate, vitamin B12, and vitamin B1 deficiency and is taking OTC supplements.  He walks with a cane to support balance and alleviate pressure off his knees.   Medications:  Current Outpatient Medications on File Prior to Visit  Medication Sig Dispense Refill  . amantadine (SYMMETREL) 100 MG capsule Take 100 mg by mouth 2 (two) times daily.  5  . AMBULATORY NON FORMULARY MEDICATION 1 Units by Other route daily. Quad cane 1 Units 0  . atorvastatin (LIPITOR) 40 MG tablet TAKE 1 TABLET BY MOUTH EVERY DAY 90 tablet 3  . benztropine (COGENTIN) 1 MG tablet Take 1 mg by mouth at bedtime.     . diazepam (VALIUM) 10 MG tablet Take 10 mg by mouth 4 (four) times daily.     . diclofenac sodium (VOLTAREN) 1 % GEL Apply 2 g topically 4 (four) times daily. Rub into affected area of foot 2 to 4 times daily 100 g 2  . FLUoxetine (PROZAC) 40 MG capsule Take 40 mg by mouth daily.  12  . furosemide (LASIX) 20 MG tablet TAKE 1 TABLET BY MOUTH EVERY DAY 90 tablet 1  . gabapentin (NEURONTIN) 600 MG tablet Take 600-1,200 mg by mouth 3 (three) times daily as needed.     . hydrOXYzine (VISTARIL) 25 MG capsule TAKE 1 CAPSULE BY MOUTH 4 TIMES A DAY  5  . losartan (COZAAR) 100 MG tablet TAKE 1 TABLET BY MOUTH EVERY DAY 90 tablet 1  . methocarbamol (ROBAXIN) 750  MG tablet TAKE 1 TABLET (750 MG TOTAL) BY MOUTH EVERY 12 (TWELVE) HOURS AS NEEDED FOR MUSCLE SPASMS. 60 tablet 1  . metoprolol succinate (TOPROL-XL) 50 MG 24 hr tablet Take 1 tablet (50 mg total) by mouth daily. Take with or immediately following a meal. 30 tablet 11  . OLANZapine (ZYPREXA) 10 MG tablet TAKE 1 TABLET BY MOUTH AT BEDTIME (REPLACES SYMBYAX)  3  . omeprazole (PRILOSEC) 20 MG capsule Take 20 mg by mouth daily as needed.    . potassium chloride (K-DUR) 10 MEQ tablet TAKE 1 TABLET BY MOUTH EVERY DAY 90 tablet 1  . Vitamin D, Ergocalciferol, (DRISDOL) 50000 units CAPS capsule Take 1 capsule (50,000 Units total) by mouth 2 (two) times a week.     No current  facility-administered medications on file prior to visit.     Allergies:  Allergies  Allergen Reactions  . Toradol [Ketorolac Tromethamine] Swelling  . Lamotrigine Rash    Review of Systems:  CONSTITUTIONAL: No fevers, chills, night sweats, or weight loss.  EYES: No visual changes or eye pain ENT: No hearing changes.  No history of nose bleeds.   RESPIRATORY: No cough, wheezing and shortness of breath.   CARDIOVASCULAR: Negative for chest pain, and palpitations.   GI: Negative for abdominal discomfort, blood in stools or black stools.  No recent change in bowel habits.   GU:  No history of incontinence.   MUSCLOSKELETAL: No history of joint pain or swelling.  No myalgias.   SKIN: Negative for lesions, rash, and itching.   ENDOCRINE: Negative for cold or heat intolerance, polydipsia or goiter.   PSYCH:  + depression +anxiety symptoms.   NEURO: As Above.   Vital Signs:  BP 104/60   Pulse (!) 118   Ht _0  (1.93 m)   Wt 260 lb (117.9 kg)   SpO2 92%   BMI 31.65 kg/m    General: comfortable appearing, slightly dishelved and poorly groomed, heavily tattooed  Neurological Exam: MENTAL STATUS including orientation to time, place, person, recent and remote memory, attention span and concentration, language, and fund of knowledge is normal.  Speech is not dysarthric.  CRANIAL NERVES: Pupils equal round and reactive to light.  Normal conjugate, extra-ocular eye movements in all directions of gaze.  No ptosis. Face is symmetric. Palate elevates symmetrically.  Tongue is midline.  MOTOR:  Motor strength is 5/5 in all extremities, including distally in the the left foot (improved).  No atrophy, fasciculations or abnormal movements.  No pronator drift.  Tone is normal.    MSRs:  Reflexes are 2+/4 throughout, including Achilles which was previously absent  SENSORY:  Vibration is trace at the great toe, intact at ankles.  Temperature and pin prick is reduced in the feet.  Marland Kitchen  COORDINATION/GAIT:  Gait appears antalgic due to knee pain, wide based and assisted with cane.   Data: NCS/EMG of the legs 02/06/2017:   The electrophysiologic findings are most consistent with a chronic sensorimotor polyneuropathy, axon loss and demyelinating in type, affecting the lower extremities.   Labs 03/17/2017:  HbA1c 6.0, vitamin B12 312, folate 2.8*, TSH 1.50, vitamin D 93 Labs 05/07/2017:  Heavy metal screen neg, SPEP with IFE no M protein, copper 107, vitamin B1 <6*, MMA 325*, CRP 0.4, ESR 13 CSF 05/14/2017:  R0 W1 P71* G69  IgG index 0.6, no OCB, ACE neg, Lyme neg, cytology neg  CT head 09/09/2013:  Neg  IMPRESSION/PLAN 1.  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  His insurance did not approve out-patient IVIG.  - Continue home exercises.  Formal PT was cost-prohibitive.  - Continue gabapentin 666m TID   2.  Vitamin B12, vitamin B1 and folate deficiency  - Check levels   - Continue vitamin B12 10066mdaily, vitamin B1 10290YBaily, and folic acid 17m75maily  3.  Tobacco abuse  - He has cut down to 0.5PPD, previously 1PPD.   - Again I reiterated the need to stop smoking to avoid risk of stroke, cancer, and MI, as well as benefits of tobacco cessation.  - He expresses interest in cutting back and us Koreastly doing this by his own will, than pharmacologic therapies  - I will reassess his progress at the next follow-up visit  Return to clinic in 6 months   Thank you for allowing me to participate in patient's care.  If I can answer any additional questions, I would be pleased to do so.    Sincerely,    Donika  K. PatPosey ProntoO

## 2017-09-04 ENCOUNTER — Telehealth: Payer: Self-pay | Admitting: Neurology

## 2017-09-04 NOTE — Telephone Encounter (Signed)
Called patient and informed him that his infusion will be on Thursday, 09-11-17 at 9:00.  Requested for him to come by our office after and have his lab work done.

## 2017-09-04 NOTE — Telephone Encounter (Signed)
Patient's mother called needing some information about lab work that was needing to be completed before the 20th. Please call her back at [phone]. Thanks.

## 2017-09-11 ENCOUNTER — Encounter (HOSPITAL_COMMUNITY)
Admission: RE | Admit: 2017-09-11 | Discharge: 2017-09-11 | Disposition: A | Discharge status: Home / Self Care | Payer: PRIVATE HEALTH INSURANCE | Source: Ambulatory Visit | Attending: Neurology | Admitting: Neurology

## 2017-09-11 DIAGNOSIS — G6181 Chronic inflammatory demyelinating polyneuritis: Secondary | ICD-10-CM | POA: Insufficient documentation

## 2017-09-11 MED ORDER — METHYLPREDNISOLONE SODIUM SUCC 1000 MG IJ SOLR: 1000.0000 mg | INTRAMUSCULAR | Status: DC

## 2017-09-11 MED ORDER — SODIUM CHLORIDE 0.9 % IV SOLN
1000.0000 mg | Freq: Once | INTRAVENOUS | Status: AC
  Administered 2017-09-11: 1000 mg via INTRAVENOUS
  Filled 2017-09-11: qty 8

## 2017-09-13 IMAGING — DX DG THORACIC SPINE 3V
3 series · 3 of 3 positions shown · non-contrast
Comparison: PA and lateral chest x-ray July 26, 2014

CLINICAL DATA: Upper back pain between the shoulder blades for the
past year without known trauma

EXAM:
THORACIC SPINE - 3 VIEWS

[t-spine ap]
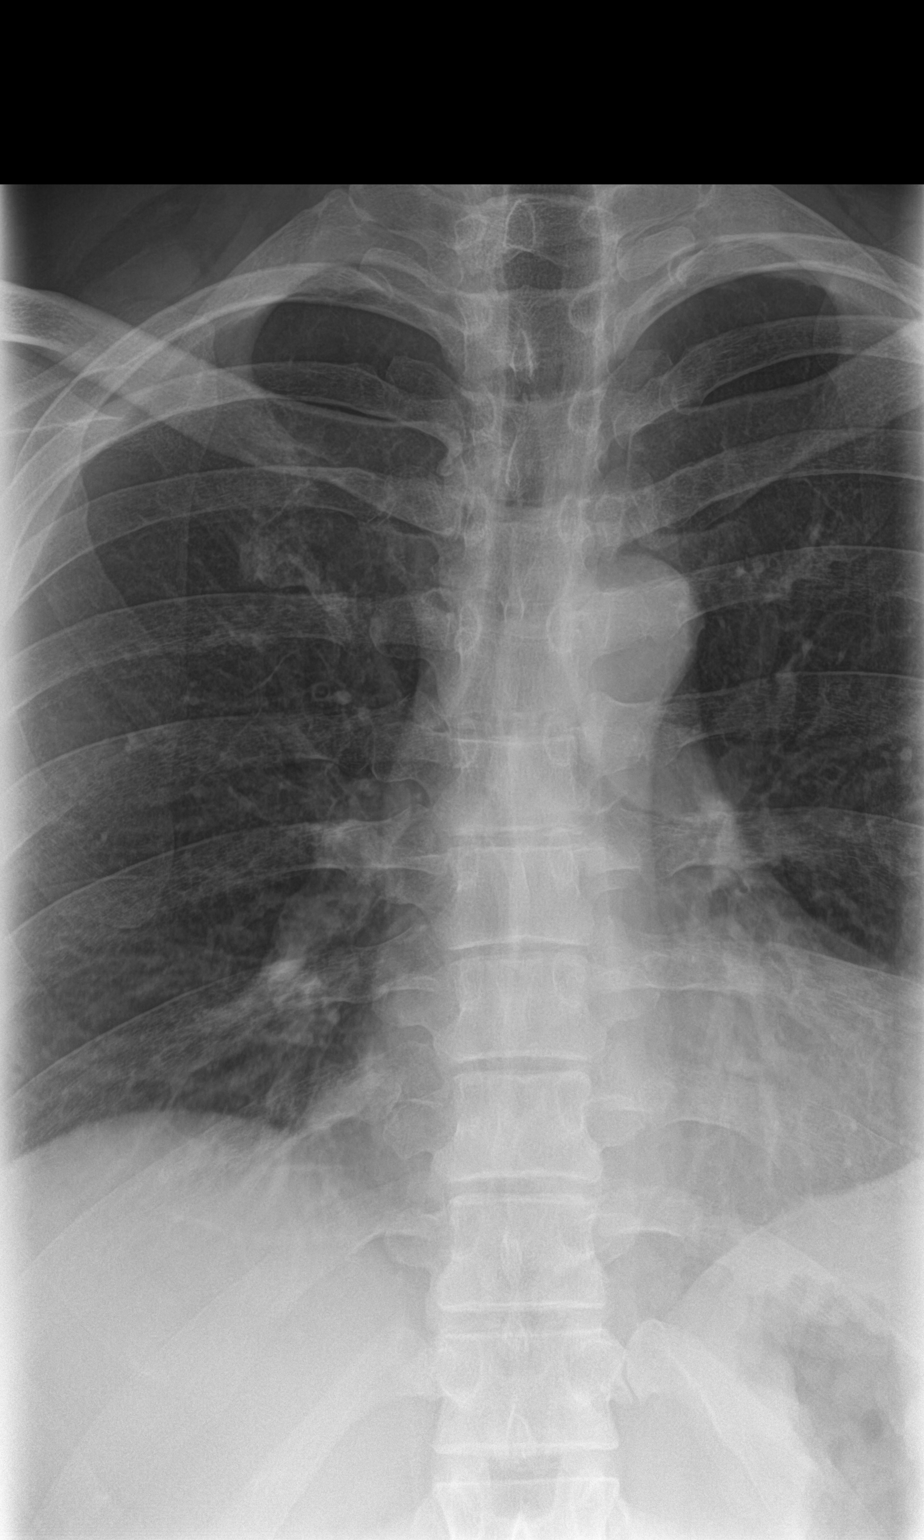

[c-spine swimmers]
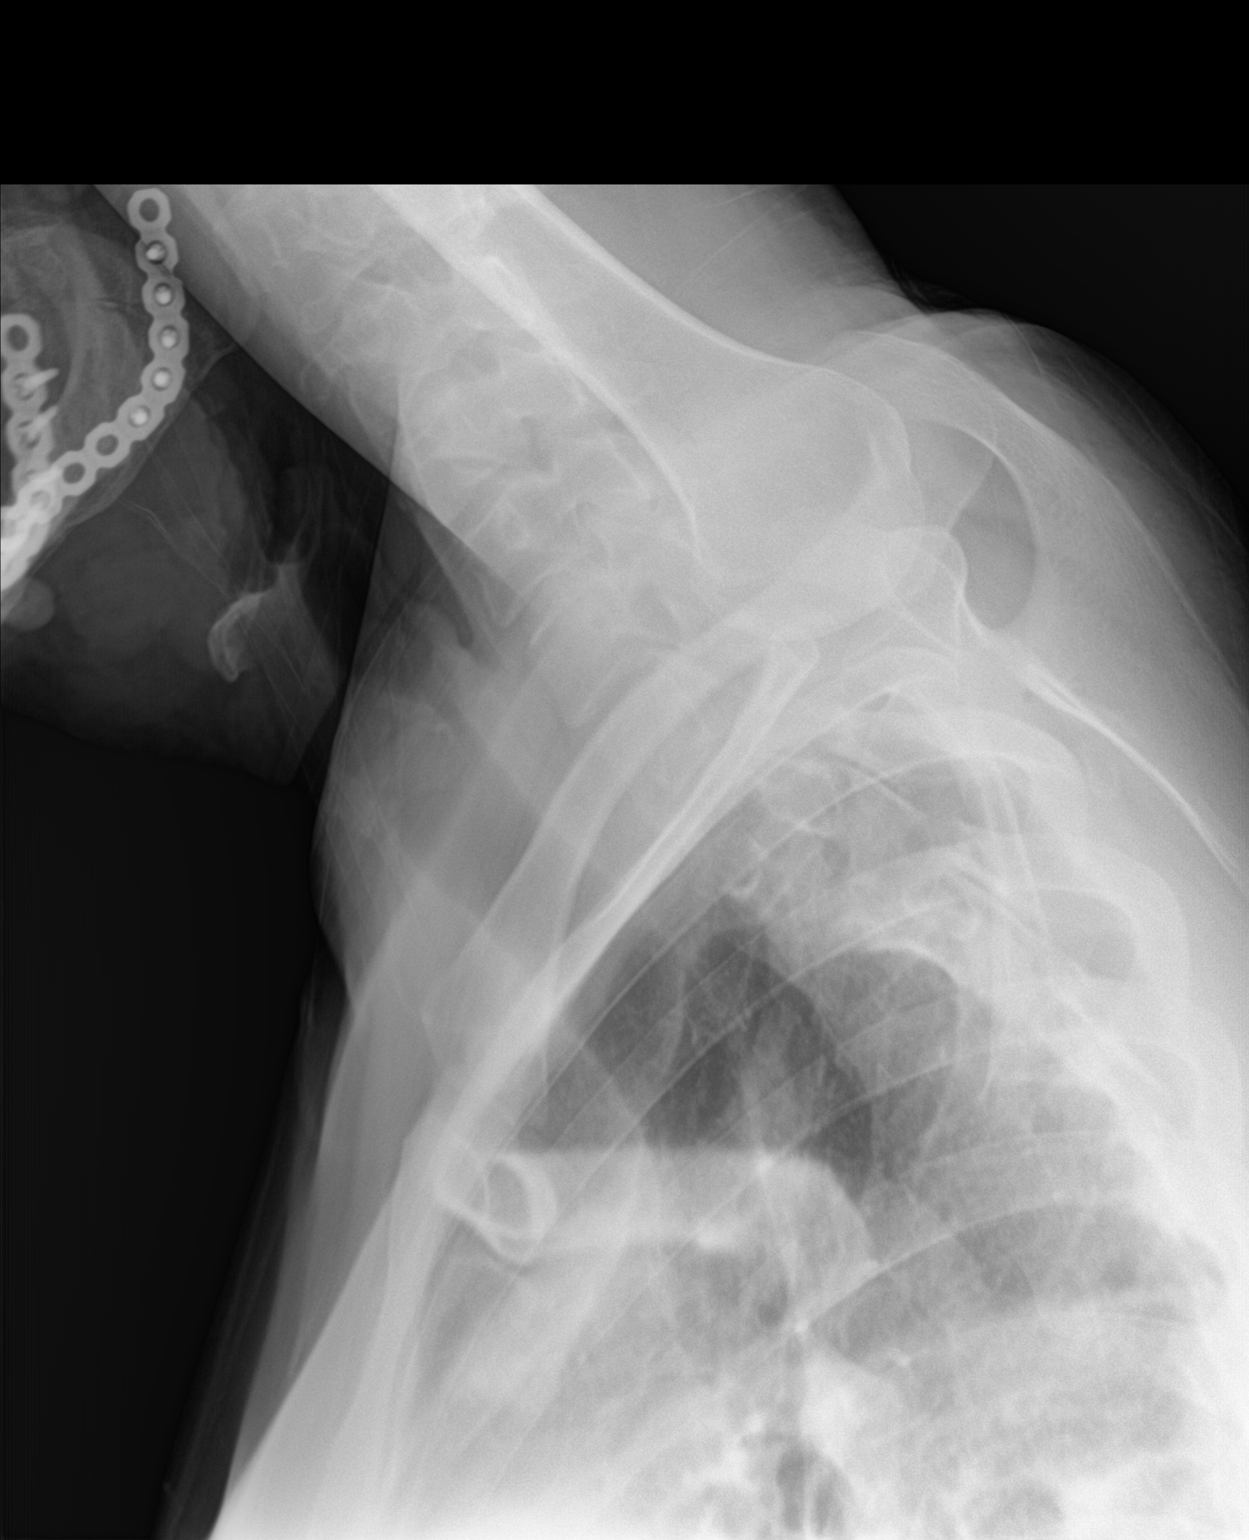

[t-spine lat]
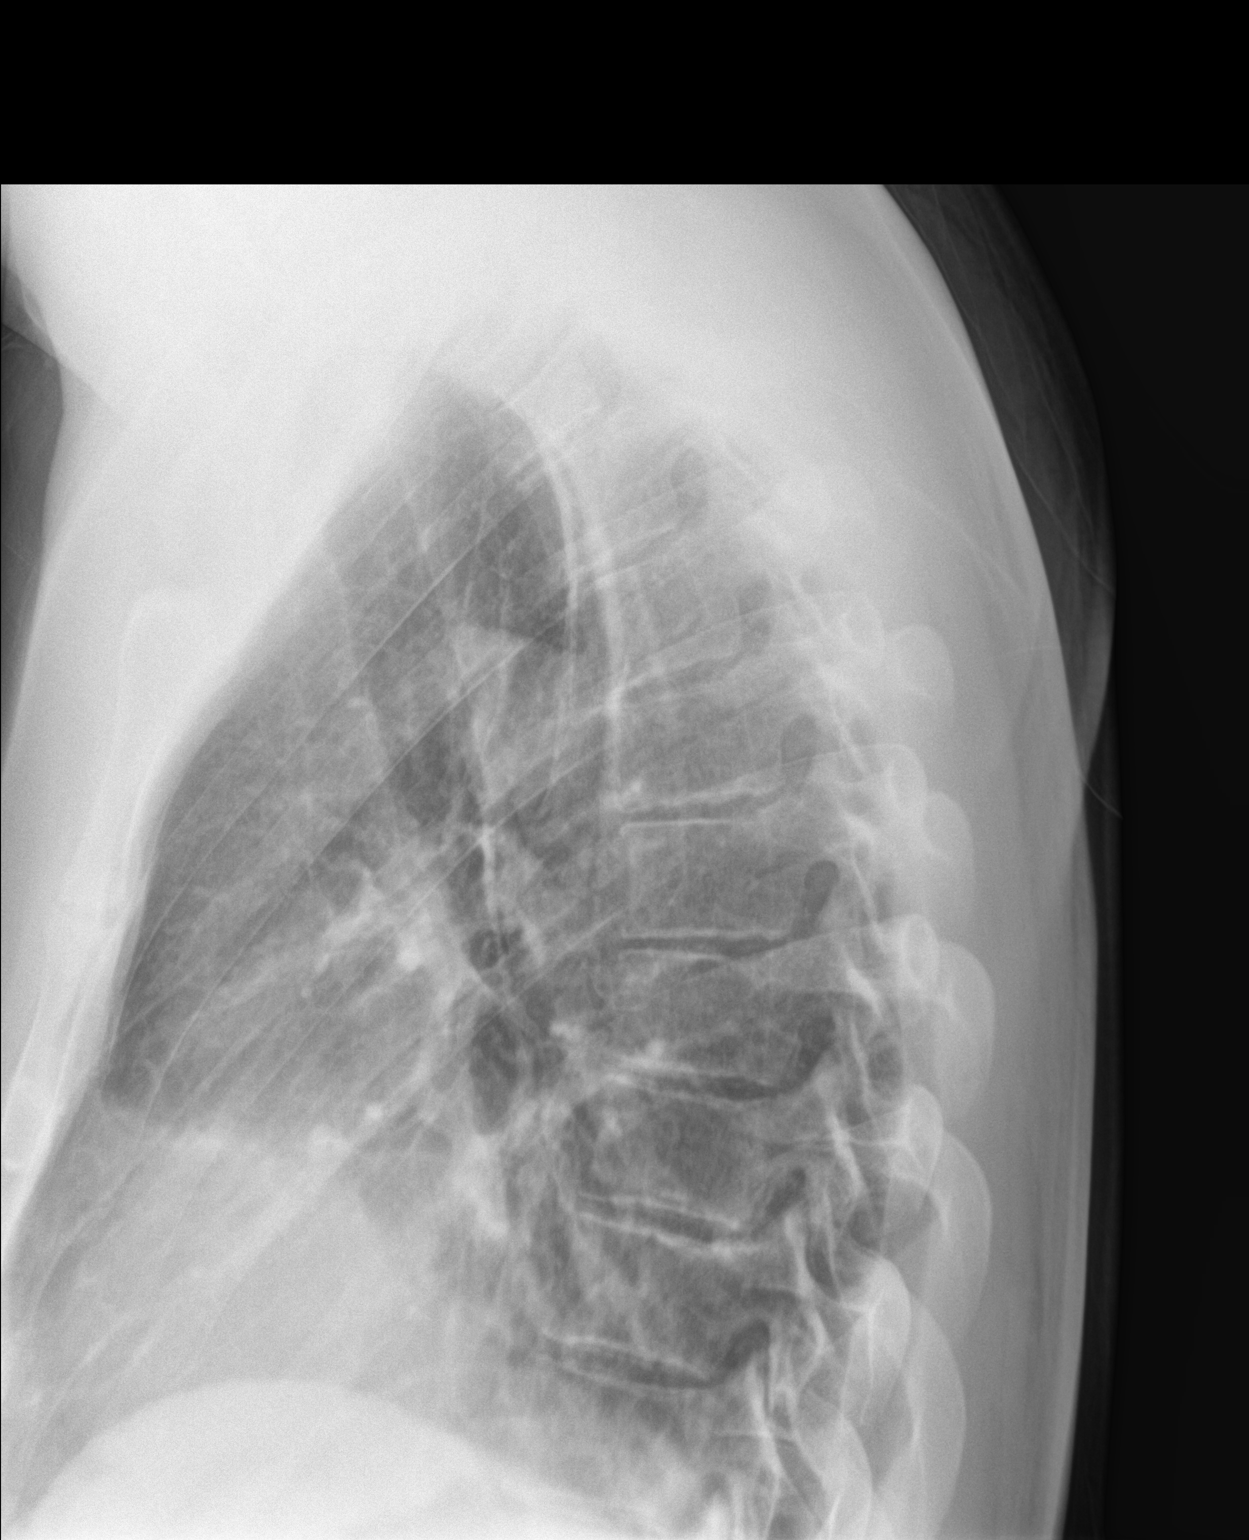

[3 of 3 positions shown; findings below may reference images not displayed]

FINDINGS: The thoracic vertebral bodies are preserved in height. Minimal
dextrocurvature centered at approximately T5 is stable. The pedicles
are intact. There are no abnormal paravertebral soft tissue
densities. The intervertebral disc space heights are reasonably well
maintained.
IMPRESSION: There is no acute bony abnormality of the thoracic spine. No
significant degenerative changes are observed either.

## 2017-09-25 ENCOUNTER — Encounter: Payer: Self-pay | Admitting: Podiatry

## 2017-09-25 ENCOUNTER — Ambulatory Visit (INDEPENDENT_AMBULATORY_CARE_PROVIDER_SITE_OTHER): Payer: Medicare Other | Admitting: Podiatry

## 2017-09-25 DIAGNOSIS — M79674 Pain in right toe(s): Secondary | ICD-10-CM

## 2017-09-25 DIAGNOSIS — M79675 Pain in left toe(s): Secondary | ICD-10-CM

## 2017-09-25 DIAGNOSIS — B351 Tinea unguium: Secondary | ICD-10-CM

## 2017-09-29 NOTE — Progress Notes (Signed)
Subjective: 42 year old male presents the office today with concerns of thick, painful, elongated toenails he cannot trim himself.  They are causing irritation with his shoes.  He states the Achilles pain pain is doing better since his been getting injections.  Otherwise he has no other concerns today. Denies any systemic complaints such as fevers, chills, nausea, vomiting. No acute changes since last appointment, and no other complaints at this time.   Objective: AAO x3, NAD DP/PT pulses palpable bilaterally, CRT less than 3 seconds Nails are hypertrophic, dystrophic, brittle, discolored, elongated 10. No surrounding redness or drainage. Tenderness nails 1-5 bilaterally. No open lesions or pre-ulcerative lesions are identified today. Prominent metatarsal head plantarly with atrophy of the fat pad.  There is no tenderness submetatarsal 1-5 bilaterally without any specific area pinpoint tenderness. There is no tenderness along the right insertion of the Achilles tendon.  Thompson test is negative and Achilles tendon appears to be intact.  No edema, erythema, increase in warmth.  No pain with lateral compression of calcaneus.  Equinus is present. No open lesions or pre-ulcerative lesions.  No pain with calf compression, swelling, warmth, erythema  Assessment: 42 year old male presents with symptomatic onychomycosis  Plan: -All treatment options discussed with the patient including all alternatives, risks, complications.  -Nails debrided x10 without any complications or bleeding. -Continue metatarsal offloading-continue stretching, icing exercises for the Achilles tendon.  He works as needed.  Continue with supportive shoes. -Patient encouraged to call the office with any questions, concerns, change in symptoms.   Vivi Barrack DPM

## 2017-10-08 ENCOUNTER — Telehealth: Payer: Self-pay | Admitting: Neurology

## 2017-10-08 ENCOUNTER — Other Ambulatory Visit: Payer: Self-pay | Admitting: *Deleted

## 2017-10-08 ENCOUNTER — Other Ambulatory Visit (HOSPITAL_COMMUNITY): Payer: Self-pay | Admitting: *Deleted

## 2017-10-08 DIAGNOSIS — G6181 Chronic inflammatory demyelinating polyneuritis: Secondary | ICD-10-CM

## 2017-10-08 NOTE — Telephone Encounter (Signed)
Orders put in

## 2017-10-08 NOTE — Telephone Encounter (Signed)
[facility] stating she needs infusion orders put in for this patient for sawumed draw. He is supposed to come in tomorrow to get it done. If you need to call her it's [phone]. Thanks!

## 2017-10-08 NOTE — Progress Notes (Signed)
Call made to Dr Eliane DecreePatel's office and message left for Morrie Sheldonshley to place orders for tomorrow's solumedrol infusion.

## 2017-10-09 ENCOUNTER — Ambulatory Visit (HOSPITAL_COMMUNITY)
Admission: RE | Admit: 2017-10-09 | Discharge: 2017-10-09 | Disposition: A | Discharge status: Home / Self Care | Payer: PRIVATE HEALTH INSURANCE | Source: Ambulatory Visit | Attending: Neurology | Admitting: Neurology

## 2017-10-09 DIAGNOSIS — G6181 Chronic inflammatory demyelinating polyneuritis: Secondary | ICD-10-CM | POA: Diagnosis not present

## 2017-10-09 MED ORDER — METHYLPREDNISOLONE SODIUM SUCC 1000 MG IJ SOLR: 1000.0000 mg | Freq: Once | INTRAMUSCULAR | Status: DC

## 2017-10-09 MED ORDER — SODIUM CHLORIDE 0.9 % IV SOLN
1000.0000 mg | Freq: Once | INTRAVENOUS | Status: AC
  Administered 2017-10-09: 1000 mg via INTRAVENOUS
  Filled 2017-10-09: qty 8

## 2017-10-13 ENCOUNTER — Other Ambulatory Visit: Payer: Self-pay | Admitting: Family Medicine

## 2017-10-14 ENCOUNTER — Other Ambulatory Visit: Payer: Self-pay | Admitting: Family Medicine

## 2017-10-14 NOTE — Telephone Encounter (Signed)
Last office visit 03/20/2017 for CPE.  No future appointments.  Last refilled 07/23/2017 for #60 with 1 refill.  Ok to refill?

## 2017-10-30 ENCOUNTER — Other Ambulatory Visit: Payer: Self-pay | Admitting: Family Medicine

## 2017-11-06 ENCOUNTER — Ambulatory Visit (HOSPITAL_COMMUNITY)
Admission: RE | Admit: 2017-11-06 | Discharge: 2017-11-06 | Disposition: A | Discharge status: Home / Self Care | Payer: Medicare Other | Source: Ambulatory Visit | Attending: Neurology | Admitting: Neurology

## 2017-11-06 ENCOUNTER — Other Ambulatory Visit: Payer: Self-pay | Admitting: *Deleted

## 2017-11-06 ENCOUNTER — Telehealth: Payer: Self-pay | Admitting: Neurology

## 2017-11-06 DIAGNOSIS — G6181 Chronic inflammatory demyelinating polyneuritis: Secondary | ICD-10-CM | POA: Insufficient documentation

## 2017-11-06 MED ORDER — SODIUM CHLORIDE 0.9 % IV SOLN
1000.0000 mg | Freq: Once | INTRAVENOUS | Status: AC
  Administered 2017-11-06: 1000 mg via INTRAVENOUS
  Filled 2017-11-06: qty 8

## 2017-11-06 MED ORDER — METHYLPREDNISOLONE SODIUM SUCC 1000 MG IJ SOLR
1000.0000 mg | Freq: Once | INTRAMUSCULAR | Status: DC
  Filled 2017-11-06: qty 8

## 2017-11-06 NOTE — Telephone Encounter (Signed)
Do we continue this monthly?

## 2017-11-06 NOTE — Telephone Encounter (Signed)
Left message for Jeffrey Lin that we would like to continue the infusions for another 6 months.

## 2017-11-06 NOTE — Telephone Encounter (Signed)
Continue monthly IVMP 1g for another 6 months.  I will reassess at his next visit. Thanks

## 2017-11-06 NOTE — Telephone Encounter (Signed)
Patient's mother called and is needing to know if today is her sons last Infusion appointment? He does not have any more scheduled. Please Call. Thanks

## 2017-11-25 ENCOUNTER — Telehealth: Payer: Self-pay | Admitting: Neurology

## 2017-11-25 NOTE — Telephone Encounter (Signed)
Patient wanted to know the name of his IVMP.

## 2017-11-25 NOTE — Telephone Encounter (Signed)
Patient's mother called and lmom needing to speak with you but did not say what it was regarding. Thanks

## 2017-12-03 ENCOUNTER — Other Ambulatory Visit: Payer: Self-pay | Admitting: *Deleted

## 2017-12-03 ENCOUNTER — Telehealth: Payer: Self-pay | Admitting: Neurology

## 2017-12-03 DIAGNOSIS — G6181 Chronic inflammatory demyelinating polyneuritis: Secondary | ICD-10-CM

## 2017-12-03 NOTE — Telephone Encounter (Signed)
Kristen from [facility] is calling in needing order put into Epic for patient's Solumedrol for tomorrow's appt. Please place this order. Thanks!

## 2017-12-03 NOTE — Telephone Encounter (Signed)
Order put in.

## 2017-12-04 ENCOUNTER — Ambulatory Visit (HOSPITAL_COMMUNITY)
Admission: RE | Admit: 2017-12-04 | Discharge: 2017-12-04 | Disposition: A | Discharge status: Home / Self Care | Payer: Medicare Other | Source: Ambulatory Visit | Attending: Neurology | Admitting: Neurology

## 2017-12-04 ENCOUNTER — Telehealth: Payer: Self-pay | Admitting: Neurology

## 2017-12-04 DIAGNOSIS — G6181 Chronic inflammatory demyelinating polyneuritis: Secondary | ICD-10-CM | POA: Diagnosis not present

## 2017-12-04 MED ORDER — SODIUM CHLORIDE 0.9 % IV SOLN
1000.0000 mg | Freq: Once | INTRAVENOUS | Status: AC
  Administered 2017-12-04: 1000 mg via INTRAVENOUS
  Filled 2017-12-04: qty 8

## 2017-12-04 MED ORDER — METHYLPREDNISOLONE SODIUM SUCC 1000 MG IJ SOLR
1000.0000 mg | Freq: Once | INTRAMUSCULAR | Status: DC
  Filled 2017-12-04: qty 8

## 2017-12-04 NOTE — Telephone Encounter (Signed)
Patient's mother called and would like to speak with you regarding a question about his Infusion. Please Call. Thanks

## 2017-12-04 NOTE — Telephone Encounter (Signed)
Patient's mom said that he had infusion yesterday and that it was to be his last one.  Are you going to continue infusions?

## 2017-12-05 NOTE — Telephone Encounter (Signed)
Yes, continue IVMP 1g every 28 days x 6 months.  I will reassess him in February and decide if it needs to be continued.  Thanks.

## 2017-12-05 NOTE — Telephone Encounter (Signed)
Patient's mother given all information.  Will order more IVMP.

## 2017-12-06 ENCOUNTER — Other Ambulatory Visit: Payer: Self-pay | Admitting: Family Medicine

## 2017-12-08 NOTE — Telephone Encounter (Signed)
Electronic refill request Robaxin Last office visit 03/20/17 Last refill 10/14/17 #30/1

## 2017-12-22 ENCOUNTER — Other Ambulatory Visit: Payer: Self-pay | Admitting: *Deleted

## 2017-12-22 DIAGNOSIS — G6181 Chronic inflammatory demyelinating polyneuritis: Secondary | ICD-10-CM

## 2017-12-25 ENCOUNTER — Ambulatory Visit (INDEPENDENT_AMBULATORY_CARE_PROVIDER_SITE_OTHER): Payer: Medicare Other | Admitting: Podiatry

## 2017-12-25 DIAGNOSIS — B351 Tinea unguium: Secondary | ICD-10-CM

## 2017-12-25 DIAGNOSIS — M79675 Pain in left toe(s): Secondary | ICD-10-CM

## 2017-12-25 DIAGNOSIS — G629 Polyneuropathy, unspecified: Secondary | ICD-10-CM

## 2017-12-25 DIAGNOSIS — G5751 Tarsal tunnel syndrome, right lower limb: Secondary | ICD-10-CM

## 2017-12-25 DIAGNOSIS — M7661 Achilles tendinitis, right leg: Secondary | ICD-10-CM | POA: Diagnosis not present

## 2017-12-25 DIAGNOSIS — M79674 Pain in right toe(s): Secondary | ICD-10-CM

## 2017-12-26 ENCOUNTER — Telehealth: Payer: Self-pay | Admitting: *Deleted

## 2017-12-26 NOTE — Telephone Encounter (Signed)
I left message for patient's mother that his upcoming infusion is on 01-08-18 at 9:00.

## 2018-01-01 NOTE — Progress Notes (Signed)
Subjective: 42 year old male presents the office today for concerns of thick, discolored, elongated toes he cannot trim himself and is requesting to be trimmed.  He also states he is getting some tingling to both of his big toes which is been on for the last month.  He does have neuropathy and is on gabapentin currently.  He also has been getting some pain on the Achilles tendon at times as well.  No recent injury or trauma since I last saw him. Denies any systemic complaints such as fevers, chills, nausea, vomiting. No acute changes since last appointment, and no other complaints at this time.   Objective: AAO x3, NAD DP/PT pulses palpable bilaterally, CRT less than 3 seconds Nails are hypertrophic, dystrophic, brittle, discolored, elongated 10. No surrounding redness or drainage. Tenderness nails 1-5 bilaterally. No open lesions or pre-ulcerative lesions are identified today. There is a positive Tinel sign on the right side today.  Subjectively he has some numbness and tingling to his big toes.  There is also some mild discomfort in the course, and insertion of the Achilles tendon on the right side worse than left.  Thompson test is negative.  There is no pain with lateral compression of the calcaneus.  There is no other areas of tenderness. No open lesions or pre-ulcerative lesions.  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  -He has debrided x10 without any complications or bleeding. -Concerned that he is getting a tarsal tunnel syndrome.  Hopefully this will resolve with provement of the inflammation.  He is also on some Achilles tendinitis issues.  Discussed with him stretching, icing exercises daily.  He has Voltaren gel on him to use.  Discussed oral steroids because his other  medical conditions were hold off on this.  Also consider physical therapy. -Follow-up in 4 weeks or sooner if needed. -Patient encouraged to call the office with any questions, concerns, change in symptoms.   Vivi BarrackMatthew R  DPM

## 2018-01-05 ENCOUNTER — Ambulatory Visit: Payer: Self-pay | Admitting: Neurology

## 2018-01-05 ENCOUNTER — Encounter

## 2018-01-08 ENCOUNTER — Ambulatory Visit (HOSPITAL_COMMUNITY)
Admission: RE | Admit: 2018-01-08 | Discharge: 2018-01-08 | Disposition: A | Discharge status: Home / Self Care | Payer: Medicare Other | Source: Ambulatory Visit | Attending: Neurology | Admitting: Neurology

## 2018-01-08 DIAGNOSIS — G6181 Chronic inflammatory demyelinating polyneuritis: Secondary | ICD-10-CM

## 2018-01-08 MED ORDER — METHYLPREDNISOLONE SODIUM SUCC 1000 MG IJ SOLR
1000.0000 mg | Freq: Once | INTRAMUSCULAR | Status: AC
  Administered 2018-01-08: 1000 mg via INTRAVENOUS
  Filled 2018-01-08: qty 8

## 2018-01-08 MED ORDER — METHYLPREDNISOLONE SODIUM SUCC 1000 MG IJ SOLR: 1000.0000 mg | INTRAMUSCULAR | Status: DC

## 2018-01-22 ENCOUNTER — Ambulatory Visit: Payer: Medicare Other | Admitting: Podiatry

## 2018-01-29 ENCOUNTER — Encounter: Payer: Self-pay | Admitting: Podiatry

## 2018-01-29 ENCOUNTER — Ambulatory Visit (INDEPENDENT_AMBULATORY_CARE_PROVIDER_SITE_OTHER): Payer: Medicare Other | Admitting: Podiatry

## 2018-01-29 DIAGNOSIS — G5751 Tarsal tunnel syndrome, right lower limb: Secondary | ICD-10-CM

## 2018-01-29 DIAGNOSIS — M722 Plantar fascial fibromatosis: Secondary | ICD-10-CM

## 2018-01-29 MED ORDER — TRIAMCINOLONE ACETONIDE 10 MG/ML IJ SUSP
10.0000 mg | Freq: Once | INTRAMUSCULAR | Status: AC
  Administered 2018-01-29: 10 mg

## 2018-01-31 ENCOUNTER — Other Ambulatory Visit: Payer: Self-pay | Admitting: Family Medicine

## 2018-02-02 NOTE — Telephone Encounter (Signed)
Last office visit 03/20/2017 for CPE.  Last refilled 12/08/2017 for #60 with 1 refill.  Next Appt: 02/03/2018 for acute visit.

## 2018-02-03 ENCOUNTER — Ambulatory Visit (INDEPENDENT_AMBULATORY_CARE_PROVIDER_SITE_OTHER): Payer: Medicare Other | Admitting: Family Medicine

## 2018-02-03 ENCOUNTER — Encounter: Payer: Self-pay | Admitting: Family Medicine

## 2018-02-03 VITALS — BP 118/90 | HR 88 | Temp 97.5°F | Ht 76.0 in | Wt 250.8 lb

## 2018-02-03 DIAGNOSIS — R39198 Other difficulties with micturition: Secondary | ICD-10-CM | POA: Diagnosis not present

## 2018-02-03 LAB — BASIC METABOLIC PANEL
BUN: 11 mg/dL (ref 6–23)
CO2: 37 meq/L — AB (ref 19–32)
Calcium: 10.1 mg/dL (ref 8.4–10.5)
Chloride: 96 mEq/L (ref 96–112)
Creatinine, Ser: 1.14 mg/dL (ref 0.40–1.50)
GFR: 74.99 mL/min (ref >60.00)
Glucose, Bld: 88 mg/dL (ref 70–99)
Potassium: 4.2 mEq/L (ref 3.5–5.1)
Sodium: 141 mEq/L (ref 135–145)

## 2018-02-03 LAB — POC URINALSYSI DIPSTICK (AUTOMATED)
Bilirubin, UA: NEGATIVE
Blood, UA: NEGATIVE
GLUCOSE UA: NEGATIVE
Ketones, UA: NEGATIVE
Leukocytes, UA: NEGATIVE
Nitrite, UA: NEGATIVE
Protein, UA: NEGATIVE
Spec Grav, UA: 1.01 (ref 1.010–1.025)
Urobilinogen, UA: 0.2 E.U./dL
pH, UA: 6 (ref 5.0–8.0)

## 2018-02-03 LAB — PSA: PSA: 0.35 ng/mL (ref 0.10–4.00)

## 2018-02-03 NOTE — Assessment & Plan Note (Signed)
No red flags liking back pain to issues urinating... pt has normal flow once able to relax.  Not true retention. Symptoms do not correspond with stone, infection or stricture, but will have pt return urine for UA.  Eval kidney function, and PSA given possible enlarged prostate ( not clearly prostatitis).

## 2018-02-03 NOTE — Progress Notes (Signed)
   Subjective:    Patient ID: Jeffrey Lin, male    DOB: [birth year], 43 y.o.   MRN: [id number]  HPI   43 year old male presents with trouble urinating in last 6 months. He is unable to give a urine sample in office today.   First thing in morning when urinates urine comes out regualry.Marland Kitchen good flow.  In afternoon.He has to stand in bathroom a long time.. more than 10 min  Before urine comes out.  Sometimes sitting down helps.  When urine comes out, even then it is a normal flow. He urinate 3 times a day  No dysuria, no blood seen, no odor.   No new changes in meds except new infusion for solumedrol... for back pain.  On amantadine, cogentin (an anticholinergic): can cause urinary retention.  No known prostate cancer or prostate issue.  Father had colon and or cancer  Social History /Family History/Past Medical History reviewed in detail and updated in EMR if needed. Blood pressure 118/90, pulse 88, temperature (!) 97.5 F (36.4 C), temperature source Oral, height 6\' 4"  (1.93 m), weight 250 lb 12 oz (113.7 kg).  Review of Systems  Constitutional: Negative for fatigue and fever.  Respiratory: Negative for shortness of breath.   Cardiovascular: Negative for chest pain.  Gastrointestinal: Negative for abdominal distention and abdominal pain.  Genitourinary: Negative for dysuria, genital sores, penile pain, scrotal swelling and testicular pain.  Musculoskeletal: Positive for back pain and gait problem.       Objective:   Physical Exam Constitutional:      Appearance: He is well-developed.  HENT:     Head: Normocephalic.     Right Ear: Hearing normal.     Left Ear: Hearing normal.     Nose: Nose normal.  Neck:     Thyroid: No thyroid mass or thyromegaly.     Vascular: No carotid bruit.     Trachea: Trachea normal.  Cardiovascular:     Rate and Rhythm: Normal rate and regular rhythm.     Pulses: Normal pulses.     Heart sounds: Heart sounds not distant. No murmur. No  friction rub. No gallop.      Comments: No peripheral edema Pulmonary:     Effort: Pulmonary effort is normal. No respiratory distress.     Breath sounds: Normal breath sounds.  Genitourinary:    Penis: Normal.      Scrotum/Testes: Normal.        Right: Tenderness not present.        Left: Tenderness not present.     Rectum: Normal.     Comments: Prostate exam difficult due to pt unable to relax... possible prostate enlargement. Musculoskeletal:     Right lower leg: No edema.     Left lower leg: No edema.  Skin:    General: Skin is warm and dry.     Findings: No rash.  Psychiatric:        Speech: Speech normal.        Behavior: Behavior normal.        Thought Content: Thought content normal.     unsteady on feet.. using cane       Assessment & Plan:

## 2018-02-03 NOTE — Patient Instructions (Addendum)
Please discuss with your psychiatrist whether your decreased ablity to urinate could be from amantadine or the anticholinergic Cogentin that you are on. Bring in urine sample as soon as able. Stop at lab on way out to check PSA and kidney test.

## 2018-02-03 NOTE — Addendum Note (Signed)
Addended by: Damita Lack on: 02/03/2018 04:48 PM   Modules accepted: Orders

## 2018-02-04 ENCOUNTER — Other Ambulatory Visit: Payer: Self-pay | Admitting: *Deleted

## 2018-02-05 ENCOUNTER — Ambulatory Visit (HOSPITAL_COMMUNITY)
Admission: RE | Admit: 2018-02-05 | Discharge: 2018-02-05 | Disposition: A | Discharge status: Home / Self Care | Payer: Medicare Other | Source: Ambulatory Visit | Attending: Neurology | Admitting: Neurology

## 2018-02-05 DIAGNOSIS — G6181 Chronic inflammatory demyelinating polyneuritis: Secondary | ICD-10-CM | POA: Diagnosis not present

## 2018-02-05 MED ORDER — SODIUM CHLORIDE 0.9 % IV SOLN
1000.0000 mg | Freq: Once | INTRAVENOUS | Status: AC
  Administered 2018-02-05: 1000 mg via INTRAVENOUS
  Filled 2018-02-05: qty 8

## 2018-02-05 MED ORDER — METHYLPREDNISOLONE SODIUM SUCC 1000 MG IJ SOLR: 1000.0000 mg | INTRAMUSCULAR | Status: DC

## 2018-02-06 NOTE — Progress Notes (Signed)
Subjective: 43 year old male presents the office with his mom for follow-up evaluation of sharp pain to the bottom of the right heel.  He states that he does get sharp discomfort area still he states it is continuous.  He does it is localized to the bottom of the heel he feels that he is walking on a nail.  He denies any recent injury or trauma or any changes in the last saw him otherwise. Denies any systemic complaints such as fevers, chills, nausea, vomiting. No acute changes since last appointment, and no other complaints at this time.   Objective: AAO x3, NAD DP/PT pulses palpable bilaterally, CRT less than 3 seconds There is tenderness palpation directly on the plantar aspect of the right heel.  There is no pain with lateral compression of the calcaneus.  No pain to the ankle or Achilles tendon.  Today there is a negative Tinel sign on the posterior tibial nerve.  No other areas of tenderness.  No edema, erythema.  No open lesions or pre-ulcerative lesions.  No pain with calf compression, swelling, warmth, erythema  Assessment: 43 year old male right heel pain, still concern for tarsal tunnel  Plan: -All treatment options discussed with the patient including all alternatives, risks, complications.  -A steroid injection was performed.  See procedure note below.  Discussed to continue with stretching, icing exercises daily as well as wearing supportive shoes and orthotics.  We again discussed the etiology of tarsal tunnel syndrome.  Today he is a negative Tinel sign over and continue to monitor as it has been positive previously.  We are going to treat for plantar fascia at this time could not rule out tarsal tunnel. -Patient encouraged to call the office with any questions, concerns, change in symptoms.   Procedure: Injection Tendon/Ligament Discussed alternatives, risks, complications and verbal consent was obtained.  Location: Right plantar fascia at the glabrous junction; medial  approach. Skin Prep: Alcohol. Injectate: 0.5cc 0.5% marcaine plain, 0.5 cc 2% lidocaine plain and, 1 cc kenalog 10. Disposition: Patient tolerated procedure well. Injection site dressed with a band-aid.  Post-injection care was discussed and return precautions discussed.   Vivi Barrack DPM

## 2018-02-11 ENCOUNTER — Other Ambulatory Visit: Payer: Self-pay | Admitting: Family Medicine

## 2018-03-05 ENCOUNTER — Other Ambulatory Visit: Payer: Self-pay | Admitting: Neurology

## 2018-03-05 ENCOUNTER — Other Ambulatory Visit: Payer: Self-pay | Admitting: Family Medicine

## 2018-03-05 ENCOUNTER — Ambulatory Visit (HOSPITAL_COMMUNITY)
Admission: RE | Admit: 2018-03-05 | Discharge: 2018-03-05 | Disposition: A | Discharge status: Home / Self Care | Payer: No Typology Code available for payment source | Source: Ambulatory Visit | Attending: Neurology | Admitting: Neurology

## 2018-03-05 DIAGNOSIS — G6181 Chronic inflammatory demyelinating polyneuritis: Secondary | ICD-10-CM | POA: Diagnosis not present

## 2018-03-05 MED ORDER — SODIUM CHLORIDE 0.9 % IV SOLN
1000.0000 mg | Freq: Once | INTRAVENOUS | Status: AC
  Administered 2018-03-05: 1000 mg via INTRAVENOUS
  Filled 2018-03-05: qty 8

## 2018-03-05 NOTE — Progress Notes (Signed)
Follow-up Visit   Date: 03/06/18    Jeffrey MAYORQUIN MRN: [id number] DOB: November 28, 1975   Interim History: Jeffrey Lin is a 43 y.o. right-handed Caucasian male with depression, hypertension, schizoaffective disorder, tobacco use, prediabetes, and anxiety returning to the clinic for follow-up of CIDP.  The patient was accompanied to the clinic by mother.  History of present illness: Starting around 2014, he began noticing sharp pain in the heel as well as firey cold sensation of the toes.  He also feels that there are compression stocking over his lower legs.  Symptoms are constant without identifying triggers or alleviating factors.  He takes gabapentin 124m three times daily which provides about 50% relief in pain.  He endorses imbalance and fallen about 5 times over the past year.  He has not sustained injury with his falls.  He walks unassisted.  He has some weakness of the left foot.  He has chronic low back pain.  He was seeing Dr. WJacqualyn Posey podiatry, who referred him for electrodiagnostic testing of the legs.  These findings show chronic sensorimotor polyneuropathy, axon loss and demyelinating.  He is here for evaluation.  Recent labs show normal vitamin B12, TSH, and low folate.  He is not taking supplements for this.   He smokes 1 ppd for about 20 years.  He was previously drinking 6 pack over the weekend, but has quit this since 2014.  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   UPDATE 09/02/2017:  He is here for follow-up visit.  He was treated with 5-days IV solumedrol 1g for probable CIDP as there was albuminocytologic dissociation on CSF and given the presence of demyelinating features on NCS.  He had not appreciated any change symptoms and continues to have severe pain of the feet for which he takes gabapentin 6078mTID.  Symptoms have not progressed, specifically, no new weakness.  He was also found to have  folate, vitamin B12, and vitamin B1 deficiency and is taking OTC supplements.  He walks with a cane to support balance and alleviate pressure off his knees.   UPDATE 03/06/2018: He is here for 6-56-monthllow-up visit.  He has been getting monthly Solu-Medrol 1 g, which he is tolerating well.  He has noticed improved low back pain when he gets steroids and improved left leg movement.  He continues to have burning paresthesias over the feet and recently began having severe pain in the mid-back region.  He is taking supplements for folate, B1, and B12.  He is not doing home exercises and is very sedentary.   Medications:  Current Outpatient Medications on File Prior to Visit  Medication Sig Dispense Refill  . amantadine (SYMMETREL) 100 MG capsule Take 100 mg by mouth 2 (two) times daily.  5  . AMBULATORY NON FORMULARY MEDICATION 1 Units by Other route daily. Quad cane 1 Units 0  . amphetamine-dextroamphetamine (ADDERALL) 20 MG tablet Take 20 mg by mouth 3 (three) times daily.  0  . atorvastatin (LIPITOR) 40 MG tablet TAKE 1 TABLET BY MOUTH EVERY DAY 90 tablet 3  . benztropine (COGENTIN) 1 MG tablet Take 1 mg by mouth at bedtime.     . diazepam (VALIUM) 10 MG tablet Take 10 mg by mouth 4 (four) times daily.     . diclofenac sodium (VOLTAREN) 1 % GEL Apply 2 g topically 4 (four) times daily. Rub into affected area of foot 2  to 4 times daily 100 g 2  . FLUoxetine (PROZAC) 40 MG capsule Take 40 mg by mouth daily.  12  . furosemide (LASIX) 20 MG tablet TAKE 1 TABLET BY MOUTH EVERY DAY 90 tablet 1  . gabapentin (NEURONTIN) 600 MG tablet Take 600-1,200 mg by mouth 3 (three) times daily as needed.     . hydrOXYzine (VISTARIL) 25 MG capsule TAKE 1 CAPSULE BY MOUTH 4 TIMES A DAY  5  . losartan (COZAAR) 100 MG tablet Take 1 tablet (100 mg total) by mouth daily. Please schedule physical 30 tablet 0  . methocarbamol (ROBAXIN) 750 MG tablet TAKE 1 TABLET (750 MG TOTAL) BY MOUTH EVERY 12 (TWELVE) HOURS AS NEEDED FOR  MUSCLE SPASMS. 60 tablet 1  . metoprolol succinate (TOPROL-XL) 50 MG 24 hr tablet Take 1 tablet (50 mg total) by mouth daily. Take with or immediately following a meal. 30 tablet 11  . OLANZapine (ZYPREXA) 10 MG tablet TAKE 1 TABLET BY MOUTH AT BEDTIME (REPLACES SYMBYAX)  3  . omeprazole (PRILOSEC) 20 MG capsule Take 20 mg by mouth daily as needed.    . potassium chloride (K-DUR) 10 MEQ tablet TAKE 1 TABLET BY MOUTH EVERY DAY 90 tablet 1  . Vitamin D, Ergocalciferol, (DRISDOL) 50000 units CAPS capsule Take 1 capsule (50,000 Units total) by mouth 2 (two) times a week.     No current facility-administered medications on file prior to visit.     Allergies:  Allergies  Allergen Reactions  . Toradol [Ketorolac Tromethamine] Swelling  . Lamotrigine Rash    Review of Systems:  CONSTITUTIONAL: No fevers, chills, night sweats, or weight loss.  EYES: No visual changes or eye pain ENT: No hearing changes.  No history of nose bleeds.   RESPIRATORY: No cough, wheezing and shortness of breath.   CARDIOVASCULAR: Negative for chest pain, and palpitations.   GI: Negative for abdominal discomfort, blood in stools or black stools.  No recent change in bowel habits.   GU:  No history of incontinence.   MUSCLOSKELETAL: No history of joint pain or swelling.  No myalgias.   SKIN: Negative for lesions, rash, and itching.   ENDOCRINE: Negative for cold or heat intolerance, polydipsia or goiter.   PSYCH:  + depression +anxiety symptoms.   NEURO: As Above.   Vital Signs:  BP 114/70   Pulse (!) 113   Ht 6' 5"  (1.956 m)   Wt 240 lb 6 oz (109 kg)   SpO2 92%   BMI 28.50 kg/m   General Medical Exam:   General:  Poorly groomed, comfortable, heavily tatooed Eyes/ENT: see cranial nerve examination.   Neck: No masses appreciated.  Full range of motion without tenderness.  No carotid bruits. Respiratory:  Clear to auscultation, good air entry bilaterally.   Cardiac:  Regular rate and rhythm, no murmur.     Ext:  No edema  Neurological Exam: MENTAL STATUS including orientation to time, place, person, recent and remote memory, attention span and concentration, language, and fund of knowledge is normal.  Speech is not dysarthric.  CRANIAL NERVES: Pupils equal round and reactive to light.  Normal conjugate, extra-ocular eye movements in all directions of gaze.  No ptosis. Face is symmetric. Palate elevates symmetrically.  Tongue is midline.  MOTOR:  Motor strength is 5/5 in all extremities, including distally in the the left foot (improved).  No atrophy, fasciculations or abnormal movements.  No pronator drift.  Tone is normal.    MSRs:  Reflexes are 2+/4  throughout (improved)  SENSORY:  Vibration is trace at the great toe, intact at ankles.  Temperature and pin prick is reduced in the feet. Marland Kitchen  COORDINATION/GAIT:  Gait is mildly unsteady appears antalgic, unassisted, improved when using a cane.  Data: NCS/EMG of the legs 02/06/2017:   The electrophysiologic findings are most consistent with a chronic sensorimotor polyneuropathy, axon loss and demyelinating in type, affecting the lower extremities.   Labs 03/17/2017:  HbA1c 6.0, vitamin B12 312, folate 2.8*, TSH 1.50, vitamin D 93 Labs 05/07/2017:  Heavy metal screen neg, SPEP with IFE no M protein, copper 107, vitamin B1 <6*, MMA 325*, CRP 0.4, ESR 13 CSF 05/14/2017:  R0 W1 P71* G69  IgG index 0.6, no OCB, ACE neg, Lyme neg, cytology neg  CT head 09/09/2013:  Neg  IMPRESSION/PLAN 1.  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  IVMP was started in July 2019 and he has continued monthly infusions.  Clinically, he has improved left foot strength and return of distal reflexes.  He continues to have distal painful paresthesias and takes gabapentin 600 mg 3 times daily.  At this point, with his intact strength and  reflexes, I will discontinue future steroid infusions and monitor clinically.  He is very sedentary and needs to start neuro rehab for low back pain, balance, and leg strengthening.  2.  Vitamin B-12, vitamin B1, and folate deficiency.  Check levels.  Continue taking vitamin B12 107mg daily, B1 1086VHdaily, and folic acid 156mdaily.  3.  Tobacco abuse.  He continues to smoke half a pack per day.  Again I discussed the dangers of tobacco abuse, including stroke, cancer, and the benefits of tobacco cessation.  Patient does not express interest in quitting at this time.  Approximately 4 minutes were spent counseling the patient on cessation techniques, including medication and support groups.  I will follow-up with his progress at the next visit.  Return to clinic in 6 months   Thank you for allowing me to participate in patient's care.  If I can answer any additional questions, I would be pleased to do so.    Sincerely,    Cord Wilczynski K. PaPosey ProntoDO

## 2018-03-05 NOTE — Progress Notes (Signed)
Received call that patient admitted now for Solumedrol infusion. Orders entered.

## 2018-03-06 ENCOUNTER — Encounter: Payer: Self-pay | Admitting: Neurology

## 2018-03-06 ENCOUNTER — Ambulatory Visit (INDEPENDENT_AMBULATORY_CARE_PROVIDER_SITE_OTHER): Payer: PPO | Admitting: Neurology

## 2018-03-06 VITALS — BP 114/70 | HR 113 | Ht 77.0 in | Wt 240.4 lb

## 2018-03-06 DIAGNOSIS — G6181 Chronic inflammatory demyelinating polyneuritis: Secondary | ICD-10-CM

## 2018-03-06 DIAGNOSIS — E519 Thiamine deficiency, unspecified: Secondary | ICD-10-CM | POA: Diagnosis not present

## 2018-03-06 DIAGNOSIS — E538 Deficiency of other specified B group vitamins: Secondary | ICD-10-CM

## 2018-03-06 DIAGNOSIS — R260 Ataxic gait: Secondary | ICD-10-CM | POA: Diagnosis not present

## 2018-03-06 DIAGNOSIS — Z720 Tobacco use: Secondary | ICD-10-CM | POA: Diagnosis not present

## 2018-03-06 NOTE — Patient Instructions (Addendum)
Start physical therapy  Check labs  Stop steroids   Return to clinic in 3 months

## 2018-03-09 ENCOUNTER — Encounter: Payer: Self-pay | Admitting: Podiatry

## 2018-03-09 ENCOUNTER — Ambulatory Visit (INDEPENDENT_AMBULATORY_CARE_PROVIDER_SITE_OTHER): Payer: PPO | Admitting: Podiatry

## 2018-03-09 ENCOUNTER — Telehealth: Payer: Self-pay | Admitting: *Deleted

## 2018-03-09 DIAGNOSIS — M7742 Metatarsalgia, left foot: Secondary | ICD-10-CM | POA: Diagnosis not present

## 2018-03-09 DIAGNOSIS — M722 Plantar fascial fibromatosis: Secondary | ICD-10-CM | POA: Diagnosis not present

## 2018-03-09 DIAGNOSIS — M7741 Metatarsalgia, right foot: Secondary | ICD-10-CM | POA: Diagnosis not present

## 2018-03-09 DIAGNOSIS — G629 Polyneuropathy, unspecified: Secondary | ICD-10-CM

## 2018-03-09 DIAGNOSIS — G5751 Tarsal tunnel syndrome, right lower limb: Secondary | ICD-10-CM

## 2018-03-09 NOTE — Telephone Encounter (Signed)
-----   Message from Vivi Barrack, DPM sent at 03/09/2018  9:37 AM EST ----- Dr. Allena Katz had put in for PT orders for [facility] it looks like. I would like to do updated orders to have them work on the feet as well when he goes. Can you send new orders or updated orders?

## 2018-03-09 NOTE — Telephone Encounter (Signed)
Faxed orders to [facility].

## 2018-03-10 ENCOUNTER — Other Ambulatory Visit: Payer: Self-pay | Admitting: *Deleted

## 2018-03-10 ENCOUNTER — Telehealth: Payer: Self-pay | Admitting: Podiatry

## 2018-03-10 MED ORDER — GABAPENTIN 600 MG PO TABS: ORAL_TABLET | ORAL | 5 refills | Status: DC

## 2018-03-10 NOTE — Progress Notes (Signed)
Subjective: Jeffrey Lin presents the office with his mom for follow-up evaluation of right heel pain worse than left as well as for worsening burning, tingling to his feet.  He is currently on gabapentin for this and then follows up with Dr. Allena Katz from neurology.  He states that the injections in the heel did help for about 1 to 2 days before the pain came back.  He denies any recent injury or falls. Denies any systemic complaints such as fevers, chills, nausea, vomiting. No acute changes since last appointment, and no other complaints at this time.   Objective: AAO x3, NAD DP/PT pulses palpable bilaterally, CRT less than 3 seconds There is mild tenderness palpation on the plantar medial tubercle of the calcaneus at the insertion of the plantar fascia bilaterally.  There is no pain with lateral compression of the calcaneus.  Plantar fascial peers to be intact.  Achilles tendon appears to be intact.  Mild prominence of metatarsal heads plantarly and subjectively starting to get some discomfort this area but not able to elicit any area of tenderness the metatarsal head.  No edema, erythema.  Subjectively he is stating he is having worsening numbness, tingling and burning to his feet.  No open lesions or pre-ulcerative lesions.  No pain with calf compression, swelling, warmth, erythema  Assessment: Bilateral heel pain, plantar fasciitis; metatarsalgia; neuropathy  Plan: -All treatment options discussed with the patient including all alternatives, risks, complications.  -At this time we are going to hold off on any further injections because of not getting long-term relief.  I do think he benefit from physical therapy.  After I discussed this he states Dr. Allena Katz has put an order in for [facility] therapy.  We will get updated orders in regards to have them help with the foot as well. -He will also benefit from orthotics.  Given the long-term nature of his pain as well as neuropathy were going to start prior  authorization for inserts. -We will discuss with Dr. Allena Katz possibly increasing gabapentin. -Patient encouraged to call the office with any questions, concerns, change in symptoms.   Vivi Barrack DPM

## 2018-03-10 NOTE — Telephone Encounter (Signed)
I'm calling to speak to Dr. Gabriel Rung nurse. If you would please call me back on my mobile number. Thank you.

## 2018-03-10 NOTE — Telephone Encounter (Signed)
I called pt, he states he has purchased new shoes and he wonder if that would make a difference when he got his orthotics. I told pt if the shoes were properly fitting and could be used for his work/weigh bearing portion of the day, they would be fine.

## 2018-03-10 NOTE — Progress Notes (Signed)
Rx sent in and left message giving patient instructions.

## 2018-03-12 ENCOUNTER — Ambulatory Visit: Payer: Medicare Other | Admitting: Podiatry

## 2018-03-16 ENCOUNTER — Other Ambulatory Visit: Payer: Self-pay | Admitting: *Deleted

## 2018-03-16 ENCOUNTER — Telehealth: Payer: Self-pay | Admitting: Neurology

## 2018-03-16 MED ORDER — GABAPENTIN 600 MG PO TABS: ORAL_TABLET | ORAL | 5 refills | Status: DC

## 2018-03-16 NOTE — Telephone Encounter (Signed)
Rx sent in again to [facility] in [place].

## 2018-03-16 NOTE — Telephone Encounter (Signed)
Patient called regarding his Gabapentin and it not being at the Pharmacy. Please Call. Thanks

## 2018-03-17 ENCOUNTER — Telehealth: Payer: Self-pay | Admitting: Neurology

## 2018-03-17 ENCOUNTER — Telehealth: Payer: Self-pay | Admitting: Podiatry

## 2018-03-17 ENCOUNTER — Other Ambulatory Visit: Payer: Self-pay | Admitting: *Deleted

## 2018-03-17 MED ORDER — GABAPENTIN 800 MG PO TABS: ORAL_TABLET | ORAL | 5 refills | Status: DC

## 2018-03-17 NOTE — Telephone Encounter (Signed)
Patient said to get his Rx filled he would need the 800 mg a day since it does not come in 900 mg.  New Rx sent in.  Ok per Dr. Allena Katz.

## 2018-03-17 NOTE — Telephone Encounter (Signed)
Patient called regarding his Gabapentin and the Dosage being increased. [facility] uses [facility] [place]. Thanks

## 2018-03-17 NOTE — Telephone Encounter (Signed)
I need a refill of [facility] called in to [facility] [place]. Patient would like to receive a callback.

## 2018-03-17 NOTE — Telephone Encounter (Signed)
I informed pt Dr. Allena Katz was managing his gabapentin. Pt states understanding.

## 2018-03-19 ENCOUNTER — Ambulatory Visit: Payer: PPO | Attending: Neurology

## 2018-03-19 ENCOUNTER — Other Ambulatory Visit: Payer: Self-pay

## 2018-03-19 DIAGNOSIS — R2681 Unsteadiness on feet: Secondary | ICD-10-CM | POA: Diagnosis not present

## 2018-03-19 DIAGNOSIS — M25572 Pain in left ankle and joints of left foot: Secondary | ICD-10-CM | POA: Diagnosis not present

## 2018-03-19 DIAGNOSIS — R2689 Other abnormalities of gait and mobility: Secondary | ICD-10-CM | POA: Diagnosis not present

## 2018-03-19 DIAGNOSIS — M25571 Pain in right ankle and joints of right foot: Secondary | ICD-10-CM

## 2018-03-19 NOTE — Therapy (Addendum)
[facility] MAIN [facility] SERVICES [address] [place], [place], [address] Phone: [phone]   Fax:  [phone]  Physical Therapy Evaluation  Patient Details  Name: Jeffrey Lin MRN: [id number] Date of Birth: [birth year] Referring Provider (PT): Dr. Nita Sickle   Encounter Date: 03/19/2018  PT End of Session - 03/19/18 1336    Visit Number  1    Number of Visits  16    Date for PT Re-Evaluation  05/14/18    Authorization Type  eval (next 1/10 start 03/19/2018)    PT Start Time  1100    PT Stop Time  1202    PT Time Calculation (min)  62 min    Equipment Utilized During Treatment  Gait belt    Activity Tolerance  Patient tolerated treatment well;Patient limited by pain    Behavior During Therapy  [facility] for tasks assessed/performed       Past Medical History:  Diagnosis Date  . Anxiety   . Chronic leg pain   . Depression   . Essential hypertension, benign 10/20/2015  . Schizoaffective disorder [facility])     Past Surgical History:  Procedure Laterality Date  . Block procedure at pain center  03/27/06  . Bone graft from hip to jaw  2004  . Bone graft right side of head to jaw  2006  . 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 Sphenopalatine ganglionic      There were no vitals filed for this visit.     Subjective Assessment - 03/19/18 1251    Subjective  The patient reports that he is doing OK today. He notes that he had a fall a few days ago when he was standing up doing something and lost his balance posteriorly. He notes that majority of his falls occur posteriorly. He states that he was uninjured from the fall as he fell backwards onto his bed. Patient notes that he has an appointment to get fitted for an ankle/foot orthotic for his plantar fasciitis in 2 weeks.    Patient is accompained by:  Family member   mother   Pertinent History  The patient presents to PT with BLE neuropathy, tarsal tunnel syndrome, and plantar fasciitis.  Patient's history difficult to obtain due to slight cognitive impairment. His PMH includes: CIDP, anxiety, depression, jaw tumor, and hypertension. The patient notes that his neuropathy symptoms began about 4-5 years ago, while he has had his heel pain for almost 20 years that has exacerbated recently resulting in the diagnosis of plantar fasciitis. The patient notes that his heel/feet pain is constant and is about 5/10 at rest. He notes that upon standing/walking/weight bearing, his pain increases to 8/10. He notes that at best, his pain is slightly less than 5/10 and that at the worst it gets up to 10/10. He describes the pain as sharp, shooting, sore, numbness, tingling, burning, and cold. Standing, walking, and weightbearing exacerbate his pain, while lying down and rest improve his pain. He notes that his balance is poor and that he experiences falls, the most recently being a few days ago. He has had about 5 falls in the past 6 months, and denies being injured because of a fall. He states when he fells, he tends to fall backwards/in a posterior direction. He walks around his home and in the community with a single point cane, but notes that he owns both quad canes and a rolling walker. He feels that his RLE gives  him more problems when standing although he does have a history of LLE injury (knee). His goals for PT include improving his balance to prevent falls, to be able to throw a ball, to be able to walk longer distances, and to be able to pick things up from off of the ground safely.    Limitations  Sitting;Reading;Lifting;Standing;Walking;House hold activities    How long can you sit comfortably?  90 minutes    How long can you stand comfortably?  5 minutes    How long can you walk comfortably?  used to be out to mailbox and back, is currently across a room    Patient Stated Goals  throw a baseball/basketball/football, improve balance, be able to play with and walk dog, picking up objects low to  ground/off of floor    Currently in Pain?  Yes    Pain Score  5     Pain Location  Foot    Pain Orientation  Right;Left   plantar surface   Pain Descriptors / Indicators  Burning;Numbness;Tingling;Shooting;Constant;Other (Comment);Sharp   cold   Pain Type  Chronic pain;Neuropathic pain    Pain Radiating Towards  down posterior legs and into feet    Pain Onset  More than a month ago   approximately 4 years ago   Pain Frequency  Constant    Aggravating Factors   walking, standing, weight bearing, prolonged sitting    Pain Relieving Factors  rest, laying down    Effect of Pain on Daily Activities  impairs balance, results in falls, patient only able to stand for short durations, patinet only able to walk short distances    Multiple Pain Sites  Yes    Pain Score  5    Pain Location  Back    Pain Orientation  Lower    Pain Descriptors / Indicators  Aching;Sore    Pain Type  Chronic pain    Pain Onset  More than a month ago    Pain Frequency  Constant         [facility] PT Assessment - 03/19/18 1110      Assessment   Medical Diagnosis  Neuropathy, Tarsal Tunnel Syndrome, Plantar fasciitis    Referring Provider (PT)  Dr. Nita Sickleonika Patel    Onset Date/Surgical Date  --   Neuropathy 4-5 years, heel pain about 20 years   Hand Dominance  Right    Next MD Visit  03/26/2018 Dr. Ermalene SearingBedsole    Prior Therapy  no      Precautions   Precautions  None    Required Braces or Orthoses  --   standard heel strap brace; instructed to stop using it     Restrictions   Weight Bearing Restrictions  No      Balance Screen   Has the patient fallen in the past 6 months  Yes    How many times?  5    Has the patient had a decrease in activity level because of a fear of falling?   Yes    Is the patient reluctant to leave their home because of a fear of falling?   No      Home Public house managernvironment   Living Environment  Private residence    Living Arrangements  Parent   mother   Type of Home  House    Home Access   Stairs to enter    Entrance Stairs-Number of Steps  4    Entrance Stairs-Rails  Right;Left;Can reach both  Home Layout  One level    Home Equipment  Walker - 2 wheels;Cane - quad;Cane - single point;Tub bench;Toilet riser      Prior Function   Level of Independence  Independent with basic ADLs    Vocation  On disability    Leisure  basketball (hurts to play now), adult coloring      Cognition   Overall Cognitive Status  History of cognitive impairments - at baseline   short and long term trouble remembering small and big things      PAIN:  8/10 bilateral heel/foot pain when weight bearing/ambulating/standing 5/10 bilateral heel/foot pain at rest Less than 5/10 bilateral heel/foot pain at best 10/10 bilateral heel/foot pain at worst  5/10 low back pain at rest today  Palpation: TTP along posterior calcaneus of R foot Concordant pain/TTP at ball of R foot and along plantar fascia through metatarsal heads; patient reports increased numbness/tingling TTP along tarsal tunnel of R foot; patient reports increased numbness/tingling with pressure/palpation  Coordination: Finger to nose: mildly impaired bilaterally, performance is slow: dysmetria and hypokinetic.  Heel to shin: unable to perform with knees flexed in sitting due to weakness; however with legs extended in sitting, patient displays choppy performance and unsmooth movements/accuracy bilaterally/ dysmetria  POSTURE: In sitting, slight forward head rounded shoulders posture; flat lumbar spine; does not sit all the way back in chair; prefers to sit with knees in excess flexion under chair   In standing, slight forward head rounded shoulders, slight forward trunk lean, slight bilateral knee flexion in static standing; following 10 seconds of static standing, significant loss of balance in posterior direction with significant trunk/postural sway in an attempt to correct   Range of Motion:  Dorsiflexion AROM: L 5 deg, R 10  deg Plantarflexion AROM: L 42 deg, R 52 deg First metatarsal head extension PROM: L 35 deg, R 35 deg (increases pain during performance bilaterally) First metatarsal head flexion PROM: L 25 deg, R 30 deg  STRENGTH:  Graded on a 0-5 scale Muscle Group Left Right  Hip Flex 4-/5 4-/5  Hip Abd (seated) 4-/5 4-/5  Hip Add 4-/5 4-/5  Knee Flex 3+/5 3+/5  Knee Ext 3/5* 3+/5*  Ankle Inv 4/5 4-/5*  Ankle Ev 4+/5 4/5  Ankle DF 4/5* 4-/5*  Ankle PF (seated) 3/5* 3/5*  *indicates pain during performance   SENSATION:  Light touch: Grossly impaired (decreased sensation) on L as compared to R at myotomes L3 and L4 with inconsistent reporting at S1  SPECIAL TESTS: Non Weight-bearing for Windlass for Plantar Fasciitis: Positive on R Tinel's test for Tarsal Tunnel Syndrome: Positive on R Slump: inconclusive bilaterally; sharp shooting pains down posterior legs reported bilaterally with slumped posture, leg extension and dorsiflexion, not effected by distal component (cervical extension)  FUNCTIONAL MOBILITY: Patient performs sit to stands and stand to sits with CGA with significant use of BUE to assist with push to stand and to control descent with sit  Patient performs sitting edge of mat to long sitting on mat table transfer with minimal assistance x1 (to lift BLE onto bed)  Patient perform long sitting on mat table to sitting edge of mat transfer and scoots forward in chair independently   BALANCE: Static Sitting Balance  Normal Able to maintain balance against maximal resistance   Good Able to maintain balance against moderate resistance   Good-/Fair+ Accepts minimal resistance   Fair Able to sit unsupported without balance loss and without UE support x  Poor+ Able to  maintain with Minimal assistance from individual or chair   Poor Unable to maintain balance-requires mod/max support from individual or chair    Dynamic Sitting Balance  Normal Able to sit unsupported and weight shift  across midline maximally   Good Able to sit unsupported and weight shift across midline moderately   Good-/Fair+ Able to sit unsupported and weight shift across midline minimally   Fair Minimal weight shifting ipsilateral/front, difficulty crossing midline   Fair- Reach to ipsilateral side and unable to weight shift x  Poor + Able to sit unsupported with min A and reach to ipsilateral side, unable to weight shift   Poor Able to sit unsupported with mod A and reach ipsilateral/front-can't cross midline    Static Standing Balance  Normal Able to maintain standing balance against maximal resistance   Good Able to maintain standing balance against moderate resistance   Good-/Fair+ Able to maintain standing balance against minimal resistance   Fair Able to stand unsupported without UE support and without LOB for 1-2 min   Fair- Requires Min A and UE support to maintain standing without loss of balance x  Poor+ Requires mod A and UE support to maintain standing without loss of balance   Poor Requires max A and UE support to maintain standing balance without loss    Standing Dynamic Balance  Normal Stand independently unsupported, able to weight shift and cross midline maximally   Good Stand independently unsupported, able to weight shift and cross midline moderately   Good-/Fair+ Stand independently unsupported, able to weight shift across midline minimally   Fair Stand independently unsupported, weight shift, and reach ipsilaterally, loss of balance when crossing midline   Poor+ Able to stand with Min A and reach ipsilaterally, unable to weight shift x  Poor Able to stand with Mod A and minimally reach ipsilaterally, unable to cross midline.     GAIT: Requires use of SPC and generally CGA Moderate ataxia during ambulation with inability to walk in relatively straight path without lateral sway Occasional loss of balance posteriorly/laterally requiring external minimal assistance x1 to  correct Decreased step length and gait speed bilaterally Slightly increased knee flexion bilaterally during stance phase Increased trunk lateral sway throughout Slightly increased forward trunk flexion throughout  OUTCOME MEASURES: TEST Outcome Interpretation  5 times sit<>stand 19 sec (requires close CGA) BUE assist <60 yo, >10 sec indicates increased risk for falls  10 meter walk test             0.67 m/s with cane and Min A to retain COM  <1.0 m/s indicates increased risk for falls; limited community ambulator  ABC Scale           26.88% <50% = low level of physical functioning    Tone: Possible mild RLE hypertonia; will assess more fully in future sessions   This patient presents with 3, personal factors/ comorbidities, and 4 body elements including body structures and functions, activity limitations and or participation restrictions. Patient's condition is unstable.     Objective measurements completed on examination: See above findings.    Treatment:  Education regarding and performance of new HEP:   Access Code: [email or web address]  URL: [email or web address]  Date: 03/19/2018  Prepared by: Precious Bard   Exercises  Seated Heel Toe Raises - 10 reps - 2 sets - 5 hold - 1x daily - 7x weekly  Supine Sciatic Nerve Mobilization With Leg on Pillow - 15 reps - 2 sets - 5 hold - 1x daily -  7x weekly  Sit to Stand with Counter Support - 10 reps - 2 sets - 5 hold - 1x daily - 7x weekly  Seated March - 10 reps - 2 sets - 5 hold - 1x daily - 7x weekly   Ice plantar surface of foot 15 minutes 2-3x/day (can use frozen water bottle) Rolling tennis ball along plantar surface of foot 60 seconds 4-5x/day         PT Education - 03/19/18 1321    Education Details  POC, HEP, nerve flossing/glides, goals, plantar fasciitis icing/tennis ball    Person(s) Educated  Patient;Parent(s)    Methods  Explanation;Demonstration;Verbal cues;Handout;Tactile cues    Comprehension   Verbalized understanding;Returned demonstration;Verbal cues required;Tactile cues required;Need further instruction       PT Short Term Goals - 03/19/18 1556      PT SHORT TERM GOAL #1   Title  Patient will be independent in home exercise program to improve strength/mobility for better functional independence with ADLs.     Baseline  2/27: HEP given    Time  2    Period  Weeks    Status  New    Target Date  04/02/18      PT SHORT TERM GOAL #2   Title  Patient will perform static standing without AD with CGA for 30 seconds without experiencing a posterior loss of balance.    Baseline  2/27: 10 seconds prior to LOB    Time  2    Period  Weeks    Status  New    Target Date  04/02/18        PT Long Term Goals - 03/19/18 1559      PT LONG TERM GOAL #1   Title  Patient will improve score on 5x STS to < or = to 10 seconds with use of unilateral UE to demonstrate improved BLE strength and balance to reduce falls risk.    Baseline  2/27: 19 seconds with significant use of BUE    Time  8    Period  Weeks    Status  New    Target Date  05/14/18      PT LONG TERM GOAL #2   Title  Patient will report 0 falls at home and in the community in the past 8 weeks to demonstrate improved balance and safety with functional mobility.    Baseline  2/27: began, fall reported days ago    Time  8    Period  Weeks    Status  New    Target Date  05/14/18      PT LONG TERM GOAL #3   Title  The patient will improve score on to > or = 1.53m/s to decrease risk of falls.    Baseline  2/27: .32m/s with SPC (requires occasional MIn A to regain COM)    Time  8    Period  Weeks    Status  New    Target Date  05/14/18      PT LONG TERM GOAL #4   Title  Patient will report worst heel/foot pain of 6/10 on VAS in order to demonstrate improved function and to help prevent risk of falls.    Baseline  2/27: 10/10 at worst    Time  8    Period  Weeks    Status  New    Target Date  05/14/18      PT  LONG TERM GOAL #5  Title  Patient will report improved score on ABC to > or = to 45% in order to demonstrate improved self-percieved confidence/ability in order to reduce risk of falls and to promote functional independence.    Baseline  2/27: 26.88%    Time  8    Period  Weeks    Status  New    Target Date  05/14/18             Plan - 03/19/18 1543    Clinical Impression Statement  The patient is a pleasant 43 year old male who presents to PT with chronic BLE neuropathy and severe chronic foot/heel pain that was recently diagnosed as tarsal tunnel syndrome and plantar fasciitis. The patient presents with impairments including: fluctuating pain of multiple regions, severely impaired static and dynamic balance, TTP of the R heel and along the plantar fascia, decreased global strength of BLE, impaired sitting and standing posture, numbness/tingling, shooting pains of possible radicular origin, minimally decreased L ankle ROM, grossly impaired sensation of LLE to light touch (as compared to RLE), impaired gait speed and mechanics representing a mild-moderate ataxic pattern, and mildly impaired global coordination/ataxia. The patient's current condition and presentation put him at a high risk for falls and a decreased ability to perform ADLs and activities that he enjoys safely. It is worth noting that there were a few inconsistencies in regards to suspected level of function/performance as compared to demonstrated level of function/performance across tests. However, the patient has numerous impairments that can be addressed with skilled PT. Ultimately, the patient will benefit from skilled PT in order to decrease pain, increase BLE strength, improve posture and balance, improve gait speed and mechanics, and to maximize safety and independence with functional mobility.    History and Personal Factors relevant to plan of care:  PMH: BLE neuropathy (CIDP), hypertension, anxiety, depression, jaw tumor  removal in 2004    Clinical Presentation  Unstable    Clinical Presentation due to:  fluctuating pain levels, multiple pain sites, severe BLE neuropathy, recent increase in falls, multiple comorbidities affecting care, cognitive impairments    Clinical Decision Making  High    Rehab Potential  Fair    Clinical Impairments Affecting Rehab Potential  (+) family support, accessible home environment; (-) chronicity, severe pain, multiple pain sites, other comorbidities     PT Frequency  2x / week    PT Duration  8 weeks    PT Treatment/Interventions  ADLs/Self Care Home Management;Aquatic Therapy;Biofeedback;Cryotherapy;Electrical Stimulation;Iontophoresis /ml Dexamethasone;Moist Heat;Traction;Ultrasound;DME Instruction;Gait training;Stair training;Functional mobility training;Therapeutic activities;Therapeutic exercise;Balance training;Neuromuscular re-education;Cognitive remediation;Patient/family education;Orthotic Fit/Training;Prosthetic Training;Manual techniques;Dry needling;Passive range of motion;Compression bandaging;Visual/perceptual remediation/compensation;Taping;Splinting;Energy conservation;Vasopneumatic Device    PT Next Visit Plan  BLE strengthening, static and dynamic balance, assess for BLE tone    PT Home Exercise Plan  see instructions section    Consulted and Agree with Plan of Care  Patient;Family member/caregiver    Family Member Consulted  Mother       Patient will benefit from skilled therapeutic intervention in order to improve the following deficits and impairments:  Abnormal gait, Cardiopulmonary status limiting activity, Decreased activity tolerance, Decreased balance, Decreased endurance, Decreased coordination, Decreased cognition, Decreased knowledge of use of DME, Decreased mobility, Decreased range of motion, Decreased safety awareness, Difficulty walking, Decreased strength, Hypermobility, Hypomobility, Increased edema, Impaired flexibility, Impaired perceived  functional ability, Impaired vision/preception, Increased muscle spasms, Impaired tone, Improper body mechanics, Impaired sensation, Postural dysfunction, Pain, Increased fascial restricitons, Decreased knowledge of precautions  Visit Diagnosis: Other abnormalities of  gait and mobility  Pain in right ankle and joints of right foot  Pain in left ankle and joints of left foot  Unsteadiness on feet     Problem List Patient Active Problem List   Diagnosis Date Noted  . Difficulty urinating 02/03/2018  . Neuropathy 05/07/2017  . Folate deficiency 05/07/2017  . Pleural effusion, left 04/12/2016  . Hoarseness of voice 11/23/2015  . Essential hypertension, benign 10/20/2015  . Schizoaffective disorder, depressive type (HCC) 02/17/2015  . Vitamin D deficiency 02/14/2015  . Low back pain 10/27/2014  . Thoracic back pain 05/21/2013  . 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 Left leg pain 09/13/2010  . History of DVT (deep vein thrombosis) 07/06/2010  . History of patellar fracture 07/06/2010  . HYPERCHOLESTEROLEMIA 10/13/2009  . PREDIABETES 10/13/2009  . Anxiety state 06/02/2006  . TOBACCO ABUSE 06/02/2006  . Chronic back pain 06/02/2006   Sandra Cockayne, SPT  This entire session was performed under direct supervision and direction of a licensed therapist/therapist assistant . I have personally read, edited and approve of the note as written.  Precious Bard, PT, DPT    03/20/2018, 12:05 PM  [place] [facility] MAIN [facility] SERVICES [address] [place], [place], [address] Phone: [phone]   Fax:  [phone]  Name: Jeffrey Lin MRN: [id number] Date of Birth: [birth year]

## 2018-03-19 NOTE — Patient Instructions (Signed)
Access Code: [email or web address]  URL: [email or web address]  Date: 03/19/2018  Prepared by: Precious Bard   Exercises  Seated Heel Toe Raises - 10 reps - 2 sets - 5 hold - 1x daily - 7x weekly  Supine Sciatic Nerve Mobilization With Leg on Pillow - 15 reps - 2 sets - 5 hold - 1x daily - 7x weekly  Sit to Stand with Counter Support - 10 reps - 2 sets - 5 hold - 1x daily - 7x weekly  Seated March - 10 reps - 2 sets - 5 hold - 1x daily - 7x weekly   Ice plantar surface of foot 15 minutes 2-3x/day (can use frozen water bottle) Rolling tennis ball along plantar surface of foot 60 seconds 4-5x/day

## 2018-03-20 ENCOUNTER — Telehealth: Payer: Self-pay | Admitting: Family Medicine

## 2018-03-20 ENCOUNTER — Ambulatory Visit: Payer: PPO | Admitting: Podiatry

## 2018-03-20 DIAGNOSIS — E7800 Pure hypercholesterolemia, unspecified: Secondary | ICD-10-CM

## 2018-03-20 DIAGNOSIS — E559 Vitamin D deficiency, unspecified: Secondary | ICD-10-CM

## 2018-03-20 DIAGNOSIS — R7309 Other abnormal glucose: Secondary | ICD-10-CM

## 2018-03-20 NOTE — Telephone Encounter (Signed)
-----   Message from Alvina Chou sent at 03/16/2018 11:15 AM EST ----- Regarding: Lab orders for Monday, 3.2.20 Patient is scheduled for CPX labs, please order future labs, Thanks , Terri   Plus I am drawing for Dr Allena Katz

## 2018-03-22 ENCOUNTER — Encounter: Payer: Self-pay | Admitting: Emergency Medicine

## 2018-03-22 ENCOUNTER — Emergency Department
Admission: EM | Admit: 2018-03-22 | Discharge: 2018-03-22 | Disposition: A | Discharge status: Other Acute Inpatient Hospital | Payer: PPO | Attending: Emergency Medicine | Admitting: Emergency Medicine

## 2018-03-22 ENCOUNTER — Emergency Department: Payer: PPO

## 2018-03-22 ENCOUNTER — Other Ambulatory Visit: Payer: Self-pay

## 2018-03-22 ENCOUNTER — Inpatient Hospital Stay: Payer: PPO

## 2018-03-22 ENCOUNTER — Inpatient Hospital Stay
Admission: AD | Admit: 2018-03-22 | Discharge: 2018-03-28 | DRG: 885 | Disposition: A | Discharge status: Other Acute Inpatient Hospital | Payer: PPO | Source: Intra-hospital | Attending: Psychiatry | Admitting: Psychiatry

## 2018-03-22 DIAGNOSIS — F259 Schizoaffective disorder, unspecified: Principal | ICD-10-CM | POA: Diagnosis present

## 2018-03-22 DIAGNOSIS — T50905A Adverse effect of unspecified drugs, medicaments and biological substances, initial encounter: Secondary | ICD-10-CM | POA: Diagnosis not present

## 2018-03-22 DIAGNOSIS — R410 Disorientation, unspecified: Secondary | ICD-10-CM | POA: Diagnosis not present

## 2018-03-22 DIAGNOSIS — G8929 Other chronic pain: Secondary | ICD-10-CM | POA: Diagnosis present

## 2018-03-22 DIAGNOSIS — R443 Hallucinations, unspecified: Secondary | ICD-10-CM

## 2018-03-22 DIAGNOSIS — Z818 Family history of other mental and behavioral disorders: Secondary | ICD-10-CM | POA: Diagnosis not present

## 2018-03-22 DIAGNOSIS — Z8249 Family history of ischemic heart disease and other diseases of the circulatory system: Secondary | ICD-10-CM

## 2018-03-22 DIAGNOSIS — R4182 Altered mental status, unspecified: Secondary | ICD-10-CM | POA: Insufficient documentation

## 2018-03-22 DIAGNOSIS — R339 Retention of urine, unspecified: Secondary | ICD-10-CM | POA: Diagnosis not present

## 2018-03-22 DIAGNOSIS — R948 Abnormal results of function studies of other organs and systems: Secondary | ICD-10-CM | POA: Diagnosis not present

## 2018-03-22 DIAGNOSIS — F251 Schizoaffective disorder, depressive type: Secondary | ICD-10-CM | POA: Diagnosis not present

## 2018-03-22 DIAGNOSIS — F419 Anxiety disorder, unspecified: Secondary | ICD-10-CM | POA: Diagnosis present

## 2018-03-22 DIAGNOSIS — Z8349 Family history of other endocrine, nutritional and metabolic diseases: Secondary | ICD-10-CM | POA: Diagnosis not present

## 2018-03-22 DIAGNOSIS — J942 Hemothorax: Secondary | ICD-10-CM | POA: Diagnosis not present

## 2018-03-22 DIAGNOSIS — F329 Major depressive disorder, single episode, unspecified: Secondary | ICD-10-CM | POA: Diagnosis not present

## 2018-03-22 DIAGNOSIS — I4581 Long QT syndrome: Secondary | ICD-10-CM | POA: Diagnosis not present

## 2018-03-22 DIAGNOSIS — R440 Auditory hallucinations: Secondary | ICD-10-CM | POA: Diagnosis not present

## 2018-03-22 DIAGNOSIS — J440 Chronic obstructive pulmonary disease with acute lower respiratory infection: Secondary | ICD-10-CM | POA: Diagnosis not present

## 2018-03-22 DIAGNOSIS — R531 Weakness: Secondary | ICD-10-CM | POA: Diagnosis not present

## 2018-03-22 DIAGNOSIS — F258 Other schizoaffective disorders: Secondary | ICD-10-CM | POA: Diagnosis not present

## 2018-03-22 DIAGNOSIS — R000 Tachycardia, unspecified: Secondary | ICD-10-CM | POA: Diagnosis not present

## 2018-03-22 DIAGNOSIS — J189 Pneumonia, unspecified organism: Secondary | ICD-10-CM | POA: Diagnosis not present

## 2018-03-22 DIAGNOSIS — R7981 Abnormal blood-gas level: Secondary | ICD-10-CM

## 2018-03-22 DIAGNOSIS — Z79899 Other long term (current) drug therapy: Secondary | ICD-10-CM

## 2018-03-22 DIAGNOSIS — I10 Essential (primary) hypertension: Secondary | ICD-10-CM | POA: Diagnosis not present

## 2018-03-22 DIAGNOSIS — F17210 Nicotine dependence, cigarettes, uncomplicated: Secondary | ICD-10-CM | POA: Diagnosis present

## 2018-03-22 DIAGNOSIS — R338 Other retention of urine: Secondary | ICD-10-CM | POA: Diagnosis not present

## 2018-03-22 DIAGNOSIS — Z888 Allergy status to other drugs, medicaments and biological substances status: Secondary | ICD-10-CM

## 2018-03-22 DIAGNOSIS — R41843 Psychomotor deficit: Secondary | ICD-10-CM | POA: Diagnosis present

## 2018-03-22 DIAGNOSIS — J90 Pleural effusion, not elsewhere classified: Secondary | ICD-10-CM | POA: Diagnosis not present

## 2018-03-22 DIAGNOSIS — F13239 Sedative, hypnotic or anxiolytic dependence with withdrawal, unspecified: Secondary | ICD-10-CM | POA: Diagnosis not present

## 2018-03-22 DIAGNOSIS — Z915 Personal history of self-harm: Secondary | ICD-10-CM

## 2018-03-22 DIAGNOSIS — K567 Ileus, unspecified: Secondary | ICD-10-CM | POA: Diagnosis not present

## 2018-03-22 DIAGNOSIS — F05 Delirium due to known physiological condition: Secondary | ICD-10-CM

## 2018-03-22 DIAGNOSIS — M79606 Pain in leg, unspecified: Secondary | ICD-10-CM | POA: Diagnosis not present

## 2018-03-22 DIAGNOSIS — A419 Sepsis, unspecified organism: Secondary | ICD-10-CM | POA: Diagnosis not present

## 2018-03-22 DIAGNOSIS — R4781 Slurred speech: Secondary | ICD-10-CM | POA: Diagnosis not present

## 2018-03-22 DIAGNOSIS — J9811 Atelectasis: Secondary | ICD-10-CM | POA: Diagnosis not present

## 2018-03-22 DIAGNOSIS — N201 Calculus of ureter: Secondary | ICD-10-CM | POA: Diagnosis not present

## 2018-03-22 DIAGNOSIS — M549 Dorsalgia, unspecified: Secondary | ICD-10-CM | POA: Diagnosis not present

## 2018-03-22 DIAGNOSIS — R0902 Hypoxemia: Secondary | ICD-10-CM | POA: Diagnosis not present

## 2018-03-22 DIAGNOSIS — R918 Other nonspecific abnormal finding of lung field: Secondary | ICD-10-CM | POA: Diagnosis not present

## 2018-03-22 DIAGNOSIS — J9601 Acute respiratory failure with hypoxia: Secondary | ICD-10-CM | POA: Diagnosis not present

## 2018-03-22 DIAGNOSIS — E1165 Type 2 diabetes mellitus with hyperglycemia: Secondary | ICD-10-CM | POA: Diagnosis not present

## 2018-03-22 DIAGNOSIS — K5903 Drug induced constipation: Secondary | ICD-10-CM | POA: Diagnosis not present

## 2018-03-22 DIAGNOSIS — G9341 Metabolic encephalopathy: Secondary | ICD-10-CM | POA: Diagnosis not present

## 2018-03-22 DIAGNOSIS — R0602 Shortness of breath: Secondary | ICD-10-CM | POA: Diagnosis not present

## 2018-03-22 LAB — URINALYSIS, COMPLETE (UACMP) WITH MICROSCOPIC
BILIRUBIN URINE: NEGATIVE
Bacteria, UA: NONE SEEN
Bilirubin Urine: NEGATIVE
Glucose, UA: NEGATIVE mg/dL
Glucose, UA: NEGATIVE mg/dL
Hgb urine dipstick: NEGATIVE
Hgb urine dipstick: NEGATIVE
KETONES UR: NEGATIVE mg/dL
Ketones, ur: NEGATIVE mg/dL
NITRITE: NEGATIVE
Nitrite: NEGATIVE
PROTEIN: NEGATIVE mg/dL
Protein, ur: NEGATIVE mg/dL
Specific Gravity, Urine: 1.009 (ref 1.005–1.030)
Specific Gravity, Urine: 1.01 (ref 1.005–1.030)
pH: 6 (ref 5.0–8.0)
pH: 6 (ref 5.0–8.0)

## 2018-03-22 LAB — URINE DRUG SCREEN, QUALITATIVE (ARMC ONLY)
Amphetamines, Ur Screen: POSITIVE — AB
Barbiturates, Ur Screen: NOT DETECTED
Benzodiazepine, Ur Scrn: POSITIVE — AB
Cannabinoid 50 Ng, Ur ~~LOC~~: NOT DETECTED
Cocaine Metabolite,Ur ~~LOC~~: NOT DETECTED
MDMA (Ecstasy)Ur Screen: NOT DETECTED
METHADONE SCREEN, URINE: NOT DETECTED
Opiate, Ur Screen: NOT DETECTED
Phencyclidine (PCP) Ur S: NOT DETECTED
Tricyclic, Ur Screen: NOT DETECTED

## 2018-03-22 LAB — CBC
HCT: 41.6 % (ref 39.0–52.0)
Hemoglobin: 13.7 g/dL (ref 13.0–17.0)
MCH: 28.1 pg (ref 26.0–34.0)
MCHC: 32.9 g/dL (ref 30.0–36.0)
MCV: 85.4 fL (ref 80.0–100.0)
PLATELETS: 189 10*3/uL (ref 150–400)
RBC: 4.87 MIL/uL (ref 4.22–5.81)
RDW: 13 % (ref 11.5–15.5)
WBC: 9.9 10*3/uL (ref 4.0–10.5)
nRBC: 0 % (ref 0.0–0.2)

## 2018-03-22 LAB — COMPREHENSIVE METABOLIC PANEL
ALT: 26 U/L (ref 0–44)
AST: 20 U/L (ref 15–41)
Albumin: 4.1 g/dL (ref 3.5–5.0)
Alkaline Phosphatase: 108 U/L (ref 38–126)
Anion gap: 10 (ref 5–15)
BUN: 16 mg/dL (ref 6–20)
CHLORIDE: 100 mmol/L (ref 98–111)
CO2: 28 mmol/L (ref 22–32)
Calcium: 9.4 mg/dL (ref 8.9–10.3)
Creatinine, Ser: 1.03 mg/dL (ref 0.61–1.24)
GFR calc Af Amer: >60 mL/min (ref >60)
GFR calc non Af Amer: >60 mL/min (ref >60)
Glucose, Bld: 108 mg/dL — ABNORMAL HIGH (ref 70–99)
Potassium: 3.4 mmol/L — ABNORMAL LOW (ref 3.5–5.1)
Sodium: 138 mmol/L (ref 135–145)
Total Bilirubin: 0.9 mg/dL (ref 0.3–1.2)
Total Protein: 7 g/dL (ref 6.5–8.1)

## 2018-03-22 LAB — ETHANOL: Alcohol, Ethyl (B): <10 mg/dL (ref <10)

## 2018-03-22 MED ORDER — LOSARTAN POTASSIUM 50 MG PO TABS
100.0000 mg | ORAL_TABLET | Freq: Every day | ORAL | Status: DC
  Administered 2018-03-23 – 2018-03-27 (×5): 100 mg via ORAL
  Filled 2018-03-22 (×6): qty 2

## 2018-03-22 MED ORDER — PANTOPRAZOLE SODIUM 40 MG PO TBEC
40.0000 mg | DELAYED_RELEASE_TABLET | Freq: Every day | ORAL | Status: DC
  Administered 2018-03-22: 40 mg via ORAL
  Filled 2018-03-22: qty 1

## 2018-03-22 MED ORDER — MAGNESIUM HYDROXIDE 400 MG/5ML PO SUSP: 30.0000 mL | Freq: Every day | ORAL | Status: DC | PRN

## 2018-03-22 MED ORDER — ACETAMINOPHEN 325 MG PO TABS: 650.0000 mg | ORAL_TABLET | Freq: Four times a day (QID) | ORAL | Status: DC | PRN

## 2018-03-22 MED ORDER — FLUOXETINE HCL 20 MG PO CAPS
40.0000 mg | ORAL_CAPSULE | Freq: Every day | ORAL | Status: DC
  Administered 2018-03-23 – 2018-03-27 (×5): 40 mg via ORAL
  Filled 2018-03-22 (×5): qty 2

## 2018-03-22 MED ORDER — DIVALPROEX SODIUM 500 MG PO DR TAB
500.0000 mg | DELAYED_RELEASE_TABLET | Freq: Three times a day (TID) | ORAL | Status: DC
  Administered 2018-03-22 – 2018-03-23 (×3): 500 mg via ORAL
  Filled 2018-03-22 (×3): qty 1

## 2018-03-22 MED ORDER — ATORVASTATIN CALCIUM 20 MG PO TABS
40.0000 mg | ORAL_TABLET | Freq: Every day | ORAL | Status: DC
  Administered 2018-03-23 – 2018-03-27 (×5): 40 mg via ORAL
  Filled 2018-03-22 (×6): qty 2

## 2018-03-22 MED ORDER — GABAPENTIN 400 MG PO CAPS
800.0000 mg | ORAL_CAPSULE | Freq: Three times a day (TID) | ORAL | Status: DC
  Administered 2018-03-22 (×2): 800 mg via ORAL
  Filled 2018-03-22 (×4): qty 2

## 2018-03-22 MED ORDER — BENZTROPINE MESYLATE 1 MG PO TABS: 1.0000 mg | ORAL_TABLET | Freq: Every day | ORAL | Status: DC

## 2018-03-22 MED ORDER — POTASSIUM CHLORIDE ER 10 MEQ PO TBCR
10.0000 meq | EXTENDED_RELEASE_TABLET | Freq: Every day | ORAL | Status: DC
  Filled 2018-03-22: qty 1

## 2018-03-22 MED ORDER — HYDROXYZINE HCL 50 MG PO TABS
25.0000 mg | ORAL_TABLET | Freq: Two times a day (BID) | ORAL | Status: DC
  Administered 2018-03-22: 25 mg via ORAL
  Filled 2018-03-22 (×2): qty 1

## 2018-03-22 MED ORDER — HYDROXYZINE HCL 25 MG PO TABS
25.0000 mg | ORAL_TABLET | Freq: Two times a day (BID) | ORAL | Status: DC
  Administered 2018-03-22 – 2018-03-27 (×10): 25 mg via ORAL
  Filled 2018-03-22 (×12): qty 1

## 2018-03-22 MED ORDER — METOPROLOL SUCCINATE ER 50 MG PO TB24
50.0000 mg | ORAL_TABLET | Freq: Every day | ORAL | Status: DC
  Administered 2018-03-22: 50 mg via ORAL
  Filled 2018-03-22: qty 1

## 2018-03-22 MED ORDER — METHOCARBAMOL 750 MG PO TABS: 750.0000 mg | ORAL_TABLET | Freq: Two times a day (BID) | ORAL | Status: DC | PRN

## 2018-03-22 MED ORDER — METOPROLOL SUCCINATE ER 25 MG PO TB24
50.0000 mg | ORAL_TABLET | Freq: Every day | ORAL | Status: DC
  Administered 2018-03-23 – 2018-03-26 (×4): 50 mg via ORAL
  Filled 2018-03-22 (×4): qty 2

## 2018-03-22 MED ORDER — AMANTADINE HCL 100 MG PO CAPS
100.0000 mg | ORAL_CAPSULE | Freq: Two times a day (BID) | ORAL | Status: DC
  Administered 2018-03-22: 100 mg via ORAL
  Filled 2018-03-22 (×2): qty 1

## 2018-03-22 MED ORDER — DIAZEPAM 5 MG PO TABS
5.0000 mg | ORAL_TABLET | Freq: Two times a day (BID) | ORAL | Status: DC
  Administered 2018-03-22: 5 mg via ORAL
  Filled 2018-03-22: qty 1

## 2018-03-22 MED ORDER — ATORVASTATIN CALCIUM 20 MG PO TABS
40.0000 mg | ORAL_TABLET | Freq: Every day | ORAL | Status: DC
  Administered 2018-03-22: 40 mg via ORAL
  Filled 2018-03-22: qty 2

## 2018-03-22 MED ORDER — LOSARTAN POTASSIUM 50 MG PO TABS
100.0000 mg | ORAL_TABLET | Freq: Every day | ORAL | Status: DC
  Administered 2018-03-22: 100 mg via ORAL
  Filled 2018-03-22: qty 2

## 2018-03-22 MED ORDER — PANTOPRAZOLE SODIUM 40 MG PO TBEC
40.0000 mg | DELAYED_RELEASE_TABLET | Freq: Every day | ORAL | Status: DC
  Administered 2018-03-23 – 2018-03-27 (×5): 40 mg via ORAL
  Filled 2018-03-22 (×5): qty 1

## 2018-03-22 MED ORDER — OLANZAPINE 5 MG PO TBDP
5.0000 mg | ORAL_TABLET | Freq: Every day | ORAL | Status: DC
  Administered 2018-03-23 – 2018-03-27 (×5): 5 mg via ORAL
  Filled 2018-03-22 (×5): qty 1

## 2018-03-22 MED ORDER — OLANZAPINE 10 MG PO TABS
10.0000 mg | ORAL_TABLET | Freq: Every day | ORAL | Status: DC
  Administered 2018-03-22 – 2018-03-27 (×6): 10 mg via ORAL
  Filled 2018-03-22 (×6): qty 1

## 2018-03-22 MED ORDER — FUROSEMIDE 40 MG PO TABS
20.0000 mg | ORAL_TABLET | Freq: Every day | ORAL | Status: DC
  Administered 2018-03-22: 20 mg via ORAL
  Filled 2018-03-22: qty 1

## 2018-03-22 MED ORDER — VITAMIN D (ERGOCALCIFEROL) 1.25 MG (50000 UNIT) PO CAPS
50000.0000 [IU] | ORAL_CAPSULE | ORAL | Status: DC
  Administered 2018-03-23 – 2018-03-26 (×2): 50000 [IU] via ORAL
  Filled 2018-03-22 (×2): qty 1

## 2018-03-22 MED ORDER — POTASSIUM CHLORIDE ER 10 MEQ PO TBCR
10.0000 meq | EXTENDED_RELEASE_TABLET | Freq: Every day | ORAL | Status: DC
  Administered 2018-03-22: 10 meq via ORAL
  Filled 2018-03-22 (×2): qty 1

## 2018-03-22 MED ORDER — METHOCARBAMOL 750 MG PO TABS
750.0000 mg | ORAL_TABLET | Freq: Two times a day (BID) | ORAL | Status: DC | PRN
  Filled 2018-03-22: qty 1

## 2018-03-22 MED ORDER — OLANZAPINE 10 MG PO TABS: 10.0000 mg | ORAL_TABLET | Freq: Every day | ORAL | Status: DC

## 2018-03-22 MED ORDER — BENZTROPINE MESYLATE 1 MG PO TABS
1.0000 mg | ORAL_TABLET | Freq: Every day | ORAL | Status: DC
  Administered 2018-03-22: 1 mg via ORAL
  Filled 2018-03-22: qty 1

## 2018-03-22 MED ORDER — GABAPENTIN 400 MG PO CAPS
800.0000 mg | ORAL_CAPSULE | Freq: Three times a day (TID) | ORAL | Status: DC
  Administered 2018-03-23 – 2018-03-27 (×14): 800 mg via ORAL
  Filled 2018-03-22 (×14): qty 2

## 2018-03-22 MED ORDER — FUROSEMIDE 20 MG PO TABS
20.0000 mg | ORAL_TABLET | Freq: Every day | ORAL | Status: DC
  Administered 2018-03-23 – 2018-03-27 (×5): 20 mg via ORAL
  Filled 2018-03-22 (×5): qty 1

## 2018-03-22 MED ORDER — OLANZAPINE 5 MG PO TBDP
5.0000 mg | ORAL_TABLET | Freq: Every day | ORAL | Status: DC
  Filled 2018-03-22: qty 1

## 2018-03-22 MED ORDER — DIAZEPAM 2 MG PO TABS
5.0000 mg | ORAL_TABLET | Freq: Two times a day (BID) | ORAL | Status: DC
  Administered 2018-03-22 – 2018-03-23 (×3): 5 mg via ORAL
  Filled 2018-03-22 (×3): qty 3

## 2018-03-22 MED ORDER — TRAZODONE HCL 100 MG PO TABS
100.0000 mg | ORAL_TABLET | Freq: Every evening | ORAL | Status: DC | PRN
  Administered 2018-03-23 – 2018-03-26 (×4): 100 mg via ORAL
  Filled 2018-03-22 (×4): qty 1

## 2018-03-22 MED ORDER — VITAMIN D (ERGOCALCIFEROL) 1.25 MG (50000 UNIT) PO CAPS: 50000.0000 [IU] | ORAL_CAPSULE | ORAL | Status: DC

## 2018-03-22 MED ORDER — AMANTADINE HCL 100 MG PO CAPS
100.0000 mg | ORAL_CAPSULE | Freq: Two times a day (BID) | ORAL | Status: DC
  Administered 2018-03-22 – 2018-03-23 (×3): 100 mg via ORAL
  Filled 2018-03-22 (×3): qty 1

## 2018-03-22 MED ORDER — ALUM & MAG HYDROXIDE-SIMETH 200-200-20 MG/5ML PO SUSP: 30.0000 mL | ORAL | Status: DC | PRN

## 2018-03-22 MED ORDER — FLUOXETINE HCL 20 MG PO CAPS
40.0000 mg | ORAL_CAPSULE | Freq: Every day | ORAL | Status: DC
  Administered 2018-03-22: 40 mg via ORAL
  Filled 2018-03-22: qty 2

## 2018-03-22 NOTE — Progress Notes (Signed)
Patient being transferred to CT with patient's RN for safety at this time.

## 2018-03-22 NOTE — ED Notes (Signed)
Patient resting quietly in room. No noted distress or abnormal behaviors noted. Will continue 15 minute checks and observation by security camera for safety. 

## 2018-03-22 NOTE — ED Notes (Signed)
Pt given lunch tray. Hand hygiene encouraged.   

## 2018-03-22 NOTE — Progress Notes (Signed)
D: Patient returned from CT scan of head which was completed. Patient presents voluntarily after mom called EMS because he was seeing people that were not there. He states he saw people cutting the top off of a car that was sitting outside his house. Patient lives with his mother. He acknowledges that he was seeing things that his mother and the other inhabitants of his home could not see. Patient denies SI and HI. Denies hearing voices. When being transferred onto the table for his CT he reported seeing an [place] woman who was not in the room at the time. He does contract for safety. He denies any history of trauma or abuse. He denies street drug use. He says he smokes 1/2 ppd and drinks alcohol socially-1-2 beers. He says his mother is concerned about him and is not sure what triggered his visual hallucinations. He denies pain. Voices no complaints. Oriented to the unit. Skin assessment performed with Arsenio Loader, RN. Patient has a scab to right forearm, three on his back and no other wounds or abrasions noted. He has multiple tattoos. Patient was searched for contraband and no contraband was found. A: Continue to monitor for safety and offer support.  R: Safety maintained.

## 2018-03-22 NOTE — ED Notes (Addendum)
Patient resting quietly in room. No noted distress or abnormal behaviors noted. Will continue 15 minute checks and observation by security camera for safety. 

## 2018-03-22 NOTE — Plan of Care (Signed)
D: Patient admitted voluntarily after mother called EMS after patient was having visual hallucinations. Denies SI and HI. Contracts for safety. Oriented to unit. Patient denies voices, continues to see people that are not there. Restless.Affect flat. Mood is depressed. Rates depression and anxiety 8/10. A: Continue to monitor for safety and offer support. R: Safety maintained.

## 2018-03-22 NOTE — ED Provider Notes (Signed)
[facility] Emergency Department Provider Note   ____________________________________________    I have reviewed the triage vital signs and the nursing notes.   HISTORY  Chief Complaint Altered Mental Status     HPI Jeffrey Lin is a 43 y.o. male who presents with reports of altered mental status/hallucinations.  Per the patient's mother over the last 3 days he is started hallucinating and she attributes this to his history of schizoaffective disorder.  No new medications reported.  No fevers or chills.  The patient reports overall he feels well and has no complaints   Past Medical History:  Diagnosis Date  . Anxiety   . Chronic leg pain   . Depression   . Essential hypertension, benign 10/20/2015  . Schizoaffective disorder [facility])     Patient Active Problem List   Diagnosis Date Noted  . Difficulty urinating 02/03/2018  . Neuropathy 05/07/2017  . Folate deficiency 05/07/2017  . Pleural effusion, left 04/12/2016  . Hoarseness of voice 11/23/2015  . Essential hypertension, benign 10/20/2015  . Schizoaffective disorder, depressive type (HCC) 02/17/2015  . Vitamin D deficiency 02/14/2015  . Low back pain 10/27/2014  . Thoracic back pain 05/21/2013  . 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 Left leg pain 09/13/2010  . History of DVT (deep vein thrombosis) 07/06/2010  . History of patellar fracture 07/06/2010  . HYPERCHOLESTEROLEMIA 10/13/2009  . PREDIABETES 10/13/2009  . Anxiety state 06/02/2006  . TOBACCO ABUSE 06/02/2006  . Chronic back pain 06/02/2006    Past Surgical History:  Procedure Laterality Date  . Block procedure at pain center  03/27/06  . Bone graft from hip to jaw  2004  . Bone graft right side of head to jaw  2006  . 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 Sphenopalatine ganglionic      Prior to Admission medications   Medication Sig Start Date End Date Taking? Authorizing Provider  amantadine  (SYMMETREL) 100 MG capsule Take 100 mg by mouth 2 (two) times daily. 08/06/15  Yes [provider]  amphetamine-dextroamphetamine (ADDERALL) 20 MG tablet Take 20 mg by mouth 3 (three) times daily. 12/07/17  Yes [provider]  atorvastatin (LIPITOR) 40 MG tablet TAKE 1 TABLET BY MOUTH EVERY DAY 04/28/17  Yes Bedsole, Amy E, MD  benztropine (COGENTIN) 1 MG tablet Take 1 mg by mouth at bedtime.  08/28/15  Yes [provider]  diazepam (VALIUM) 10 MG tablet Take 10 mg by mouth 4 (four) times daily.  09/12/15  Yes [provider]  FLUoxetine (PROZAC) 40 MG capsule Take 40 mg by mouth daily. 02/27/17  Yes [provider]  furosemide (LASIX) 20 MG tablet TAKE 1 TABLET BY MOUTH EVERY DAY 10/30/17  Yes Bedsole, Amy E, MD  gabapentin (NEURONTIN) 800 MG tablet Take 800 mg three x a day. 03/17/18  Yes Patel, Donika K, DO  hydrOXYzine (VISTARIL) 25 MG capsule TAKE 1 CAPSULE BY MOUTH 4 TIMES A DAY 08/02/15  Yes [provider]  losartan (COZAAR) 100 MG tablet Take 1 tablet (100 mg total) by mouth daily. Please schedule physical 03/05/18  Yes Bedsole, Amy E, MD  methocarbamol (ROBAXIN) 750 MG tablet TAKE 1 TABLET (750 MG TOTAL) BY MOUTH EVERY 12 (TWELVE) HOURS AS NEEDED FOR MUSCLE SPASMS. 02/02/18  Yes Bedsole, Amy E, MD  metoprolol succinate (TOPROL-XL) 50 MG 24 hr tablet Take 1 tablet (50 mg total) by mouth daily. Take with or immediately following a meal. 04/12/16  Yes Bedsole, Amy E, MD  OLANZapine (ZYPREXA) 10 MG tablet TAKE 1 TABLET BY MOUTH AT BEDTIME (REPLACES SYMBYAX) 02/14/16  Yes [provider]  omeprazole (PRILOSEC) 20 MG capsule Take 20 mg by mouth daily as needed.   Yes [provider]  potassium chloride (K-DUR) 10 MEQ tablet TAKE 1 TABLET BY MOUTH EVERY DAY 02/11/18  Yes Bedsole, Amy E, MD  Vitamin D, Ergocalciferol, (DRISDOL) 50000 units CAPS capsule Take 1 capsule (50,000 Units total) by mouth 2 (two) times a week. 07/01/16  Yes Joaquim Namuncan,  Graham S, MD     Allergies Toradol [ketorolac tromethamine] and Lamotrigine  Family History  Problem Relation Age of Onset  . Hyperlipidemia Mother   . Hypertension Mother   . Depression Mother   . Anxiety disorder Mother   . Depression Brother   . Anxiety disorder Brother   . Bipolar disorder Maternal Grandfather   . Schizophrenia Maternal Grandfather   . Bipolar disorder Paternal Grandfather     Social History Social History   Tobacco Use  . Smoking status: Current Every Day Smoker    Packs/day: 1.00    Years: 23.00    Pack years: 23.00    Types: Cigarettes    Start date: 09/22/1995  . Smokeless tobacco: Former Engineer, waterUser  Substance Use Topics  . Alcohol use: No    Alcohol/week: 0.0 standard drinks  . Drug use: No    Review of Systems  Constitutional: No fever/chills Eyes: No visual changes.  ENT: No sore throat. Cardiovascular: Denies chest pain. Respiratory: Denies shortness of breath. Gastrointestinal: No abdominal pain.  No nausea, no vomiting.   Genitourinary: Negative for dysuria. Musculoskeletal: Negative for back pain. Skin: Negative for rash. Neurological: Negative for headache   ____________________________________________   PHYSICAL EXAM:  VITAL SIGNS: ED Triage Vitals  Enc Vitals Group     BP 03/22/18 0854 (!) 146/95     Pulse Rate 03/22/18 0854 77     Resp 03/22/18 0854 16     Temp 03/22/18 0854 97.9 F (36.6 C)     Temp Source 03/22/18 0854 Oral     SpO2 03/22/18 0847 98 %     Weight 03/22/18 0852 113.4 kg (250 lb)     Height 03/22/18 0852 1.956 m (6\' 5" )     Head Circumference --      Peak Flow --      Pain Score 03/22/18 0852 0     Pain Loc --      Pain Edu? --      Excl. in GC? --    Constitutional: Alert and oriented.  Eyes: Conjunctivae are normal.   Nose: No congestion/rhinnorhea. Mouth/Throat: Mucous membranes are moist.    Cardiovascular: Normal rate, regular rhythm. Grossly normal heart sounds.  Good peripheral  circulation. Respiratory: Normal respiratory effort.  No retractions. Lungs CTAB. Gastrointestinal: Soft and nontender. No distention.  No CVA tenderness.  Musculoskeletal: No lower extremity tenderness nor edema.  Warm and well perfused Neurologic:  Normal speech and language. No gross focal neurologic deficits are appreciated.  Skin:  Skin is warm, dry and intact. No rash noted. Psychiatric: Flat affect l. Speech and behavior are normal.  ____________________________________________   LABS (all labs ordered are listed, but only abnormal results are displayed)  Labs Reviewed  COMPREHENSIVE METABOLIC PANEL - Abnormal; Notable for the following components:      Result Value   Potassium 3.4 (*)    Glucose, Bld 108 (*)    All other components  within normal limits  URINE DRUG SCREEN, QUALITATIVE ([facility] ONLY) - Abnormal; Notable for the following components:   Amphetamines, Ur Screen POSITIVE (*)    Benzodiazepine, Ur Scrn POSITIVE (*)    All other components within normal limits  URINALYSIS, COMPLETE (UACMP) WITH MICROSCOPIC - Abnormal; Notable for the following components:   Color, Urine YELLOW (*)    APPearance CLEAR (*)    Leukocytes,Ua SMALL (*)    Bacteria, UA RARE (*)    All other components within normal limits  CBC  ETHANOL   ____________________________________________  EKG  None ____________________________________________  RADIOLOGY  CT head unremarkable ____________________________________________   PROCEDURES  Procedure(s) performed: No  Procedures   Critical Care performed: No ____________________________________________   INITIAL IMPRESSION / ASSESSMENT AND PLAN / ED COURSE  Pertinent labs & imaging results that were available during my care of the patient were reviewed by me and considered in my medical decision making (see chart for details).  Patient presents with hallucinations x3 days with a history of schizophrenia.  Lab work is overall  quite reassuring, CT head/imaging is unremarkable.  I have consulted psychiatry and TTS  Psychiatry will admit the patient    ____________________________________________   FINAL CLINICAL IMPRESSION(S) / ED DIAGNOSES  Final diagnoses:  Hallucinations        Note:  This document was prepared using Dragon voice recognition software and may include unintentional dictation errors.   Jene Every, MD 03/22/18 1538

## 2018-03-22 NOTE — ED Notes (Signed)
Pt given dinner tray. Hand hygiene encouraged.  

## 2018-03-22 NOTE — BH Assessment (Signed)
Patient is to be admitted to [facility] by Dr. Reita May.  Attending Physician will be Dr. Toni Amend.   Patient has been assigned to room 316, by [facility] Charge Nurse T'Yawn.   Intake Paper Work has been signed and placed on patient chart.  ER staff is aware of the admission:  Ronnie, ER Secretary    Dr. Darnelle Catalan, ER MD   Amy B., Patient's Nurse   [place], Patient Access.

## 2018-03-22 NOTE — BH Assessment (Signed)
Assessment Note  Jeffrey Lin is an 43 y.o. male who presents to the ER due to change in his mental state. Per the report of the patient's mother Jeffrey Lin), his neurologist made a change in his medication and that's when she noticed the change in his behaviors. He's responding to internally stimuli. Several consecutive days he thought someone was outside of his home and had knife to protect himself and his mother. Mother also reports, he's sleep has decreased.  Prior to this he was stable and hadn't been inpatient since 2015.  Majority of the information for this assessment was provided by his mother Jeffrey Lin-[phone]). Mother also reports, the patient walks with a cane and currently "a little unstable."  Diagnosis: Schizoaffective Disorder  Past Medical History:  Past Medical History:  Diagnosis Date  . Anxiety   . Chronic leg pain   . Depression   . Essential hypertension, benign 10/20/2015  . Schizoaffective disorder [facility])     Past Surgical History:  Procedure Laterality Date  . Block procedure at pain center  03/27/06  . Bone graft from hip to jaw  2004  . Bone graft right side of head to jaw  2006  . 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 Sphenopalatine ganglionic      Family History:  Family History  Problem Relation Age of Onset  . Hyperlipidemia Mother   . Hypertension Mother   . Depression Mother   . Anxiety disorder Mother   . Depression Brother   . Anxiety disorder Brother   . Bipolar disorder Maternal Grandfather   . Schizophrenia Maternal Grandfather   . Bipolar disorder Paternal Grandfather     Social History:  reports that he has been smoking cigarettes. He started smoking about 22 years ago. He has a 23.00 pack-year smoking history. He has quit using smokeless tobacco. He reports that he does not drink alcohol or use drugs.  Additional Social History:  Alcohol / Drug Use Pain Medications: See PTA Prescriptions: See PTA Over the  Counter: See PTA History of alcohol / drug use?: Yes Longest period of sobriety (when/how long): Six years Negative Consequences of Use: (n/a) Withdrawal Symptoms: (n/a)  CIWA: CIWA-Ar BP: (!) 127/107 Pulse Rate: 78 COWS:    Allergies:  Allergies  Allergen Reactions  . Toradol [Ketorolac Tromethamine] Swelling  . Lamotrigine Rash    Home Medications: (Not in a hospital admission)   OB/GYN Status:  No LMP for male patient.  General Assessment Data Location of Assessment: [facility] ED TTS Assessment: In system Is this a Tele or Face-to-Face Assessment?: Face-to-Face Is this an Initial Assessment or a Re-assessment for this encounter?: Initial Assessment Patient Accompanied by:: Other(Mother) Language Other than English: No Living Arrangements: Other (Comment)(Private Home) What gender do you identify as?: Male Marital status: Single Pregnancy Status: No Living Arrangements: Other relatives(Live with mother) Can pt return to current living arrangement?: Yes Admission Status: Voluntary Is patient capable of signing voluntary admission?: Yes Referral Source: Self/Family/Friend Insurance type: [employer]  Medical Screening Exam [facility] Walk-in ONLY) Medical Exam completed: Yes  Crisis Care Plan Living Arrangements: Other relatives(Live with mother) Legal Guardian: Other:(Self) Name of Psychiatrist: Dr. Harrel Carina Name of Therapist: Reports of none  Education Status Is patient currently in school?: No Is the patient employed, unemployed or receiving disability?: Unemployed, Receiving disability income  Risk to self with the past 6 months Suicidal Ideation: No Has patient been a risk to self within the past 6 months prior  to admission? : No Suicidal Intent: No Has patient had any suicidal intent within the past 6 months prior to admission? : No Is patient at risk for suicide?: No Suicidal Plan?: No Has patient had any suicidal plan within the past 6 months prior to  admission? : No Access to Means: No What has been your use of drugs/alcohol within the last 12 months?: Reports of none Previous Attempts/Gestures: No Other Self Harm Risks: Reports of none Triggers for Past Attempts: None known Intentional Self Injurious Behavior: None Family Suicide History: No Recent stressful life event(s): Other (Comment)(Medication Change) Persecutory voices/beliefs?: No Depression: Yes Depression Symptoms: Despondent, Insomnia(Per mother) Substance abuse history and/or treatment for substance abuse?: No Suicide prevention information given to non-admitted patients: Not applicable  Risk to Others within the past 6 months Homicidal Ideation: No Does patient have any lifetime risk of violence toward others beyond the six months prior to admission? : No Thoughts of Harm to Others: No Current Homicidal Intent: No Current Homicidal Plan: No Access to Homicidal Means: No Identified Victim: Reports of none History of harm to others?: No Assessment of Violence: None Noted Violent Behavior Description: Reports of none Does patient have access to weapons?: No Criminal Charges Pending?: No Does patient have a court date: No Is patient on probation?: No  Psychosis Hallucinations: Auditory, Visual Delusions: Persecutory  Mental Status Report Appearance/Hygiene: Unremarkable, In scrubs Eye Contact: Poor Motor Activity: Unsteady(Per mother) Speech: Soft Level of Consciousness: Quiet/awake Mood: Suspicious, Sad, Pleasant Affect: Blunted Anxiety Level: Moderate Thought Processes: Flight of Ideas Judgement: Impaired Orientation: Person Obsessive Compulsive Thoughts/Behaviors: None  Cognitive Functioning Concentration: Decreased(Per mother) Memory: Unable to Assess Is patient IDD: No Insight: Fair Impulse Control: Fair Appetite: Good Have you had any weight changes? : No Change Sleep: Decreased Total Hours of Sleep: 4 Vegetative Symptoms:  None  ADLScreening [facility] Assessment Services) Patient's cognitive ability adequate to safely complete daily activities?: Yes Patient able to express need for assistance with ADLs?: Yes Independently performs ADLs?: Yes (appropriate for developmental age)  Prior Inpatient Therapy Prior Inpatient Therapy: Yes Prior Therapy Dates: 2015 & 2012 Prior Therapy Facilty/Provider(s): [facility] & [facility] Reason for Treatment: Schizoaffective Disorder  Prior Outpatient Therapy Prior Outpatient Therapy: Yes Prior Therapy Dates: Current Prior Therapy Facilty/Provider(s): Dr. Harrel Carina (Private Office) Reason for Treatment: Schizoaffective Disorder Does patient have an ACCT team?: No Does patient have Intensive In-House Services?  : No Does patient have [facility] services? : No Does patient have P4CC services?: No  ADL Screening (condition at time of admission) Patient's cognitive ability adequate to safely complete daily activities?: Yes Is the patient deaf or have difficulty hearing?: No Does the patient have difficulty seeing, even when wearing glasses/contacts?: No Does the patient have difficulty concentrating, remembering, or making decisions?: No Patient able to express need for assistance with ADLs?: Yes Does the patient have difficulty dressing or bathing?: No Independently performs ADLs?: Yes (appropriate for developmental age) Does the patient have difficulty walking or climbing stairs?: No Weakness of Legs: None Weakness of Arms/Hands: None  Home Assistive Devices/Equipment Home Assistive Devices/Equipment: None  Therapy Consults (therapy consults require a physician order) PT Evaluation Needed: No OT Evalulation Needed: No SLP Evaluation Needed: No Abuse/Neglect Assessment (Assessment to be complete while patient is alone) Abuse/Neglect Assessment Can Be Completed: Yes Physical Abuse: Denies Verbal Abuse: Denies Sexual Abuse: Denies Exploitation of patient/patient's  resources: Denies Self-Neglect: Denies Values / Beliefs Cultural Requests During Hospitalization: None Spiritual Requests During Hospitalization: None Consults Spiritual  Care Consult Needed: No Social Work Consult Needed: No Merchant navy officer (For Healthcare) Does Patient Have a Medical Advance Directive?: No Would patient like information on creating a medical advance directive?: No - Patient declined       Child/Adolescent Assessment Running Away Risk: Denies(Patient is an adult)  Disposition:  Disposition Initial Assessment Completed for this Encounter: Yes  On Site Evaluation by:   Reviewed with Physician:    Lilyan Gilford MS, LCAS, LPMHCA, NCC, CCSI Therapeutic Triage Specialist 03/22/2018 12:01 PM

## 2018-03-22 NOTE — ED Notes (Signed)
Pt will be transferred to BMU after 7pm.  

## 2018-03-22 NOTE — ED Triage Notes (Signed)
Pt to ED via [facility] from home for altered mental status x 1 week. Per EMS pts mother reports that pt has been having hallucinations and arguing with people who are not there. Mother also reports that pt pt has had increased confusion since his gabapentin was stopped a weeks ago. Pt is alert and oriented x 4 on arrival, in no acute distress.

## 2018-03-22 NOTE — Consult Note (Addendum)
[facility] Face-to-Face Psychiatry Consult   Reason for Consult:  Jeffrey Lin and Visual hallucinations Referring Physician:  Dr. Phineas Lin Patient Identification: Jeffrey Lin MRN:  [id number]   Diagnosis:  Schizoaffective disorder, depressive type, acute on chronic exacerbation Rule out acute encephalopathy History of ADHD Hypertension Hyperlipidemia History of mandibular reconstruction  Total Time spent with patient: 82 minutes.  HPI:   Patient is a 43 year old male with a history of schizophrenia that was diagnosed in 2014; hx of ECTs in the past.  Patient is a poor historian today and was brought to the emergency department due to altered mental status over the past 3 to 4 days.  Patient lives with his mother, and she at his bedside at this time.  Patient is currently on Zyprexa and does see Dr. Evelene Lin in [facility] for psychiatric management..  According to his mother, the patient has been internally preoccupied with increased psychosis, talking to people in the home that were not there.  He told his mother that there is a man in the house.  He has been experiencing some visual hallucinations as well, his mother believes but is uncertain what.  Patient is not able to tell me the specifics, unable to elaborate at this time.  He he does speak softly and slowly and his mother tells me that he had a mandibular reconstruction in 2015.  His mother believes that he has been mildly depressed due to the upcoming 1 year death anniversary of his father who passed away last year.  Patient's grandmother passed away 2 months ago.  Identifies no other stressors.  No falls or traumas to his head.  Patient's mother indicates that he is seeing a neurologist for a spinal cord condition for which he gets infusions of Solu-Medrol once a month.  Patient is being by Dr. Allena Lin at [facility]--she is unsure of the condition.  This morning, the patient's mother states that he was having chills.  In  the emergency department his CBC is within normal limits and he is afebrile.  His urine toxicology is still pending but he denies use of drugs and alcohol.  He is noted to be on 60 mg of Adderall and 40 mg of Valium per day.  His mother indicates that his medications does help and believes that he has been compliant with them but he had been doing better until the Adderall was started in August 2019.  She is unsure if the hallucinations have increased since then.  Also, his gabapentin was recently changed from 1203 times a day to 803 times a day by his primary care physician.  Patient usually sleeps well at night.  He is had chronic problems with poor appetite for many years, only eating 1 meal per day.  Today, patient does endorse auditory and visual hallucinations.  Denies suicidal or homicidal thoughts plan, intent, drive, or preparation.  Denies issues with OCD and PTSD.  Past Psychiatric History:  The patient did have a psychiatric admission in [facility] in 2014.  It is unclear how long he has been on the Prozac and Zyprexa.  Reports medication compliance.  Sees Dr. Evelene Lin for psychiatric services.   Past Medical History:  Past Medical History:  Diagnosis Date  . Anxiety   . Chronic leg pain   . Depression   . Essential hypertension, benign 10/20/2015  . Schizoaffective disorder [facility])     Past Surgical History:  Procedure Laterality Date  . Block procedure at pain center  03/27/06  .  Bone graft from hip to jaw  2004  . Bone graft right side of head to jaw  2006  . 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 Sphenopalatine ganglionic     Family History:  Family History  Problem Relation Age of Onset  . Hyperlipidemia Mother   . Hypertension Mother   . Depression Mother   . Anxiety disorder Mother   . Depression Brother   . Anxiety disorder Brother   . Bipolar disorder Maternal Grandfather   . Schizophrenia Maternal Grandfather   . Bipolar disorder Paternal Grandfather     Family Psychiatric  History:  [place] lives in [place] with His Mother.  He Does Have a Son but Gave up His Parental Rights.  Patient Is on Disability, Last Worked in 2014. Social History:  Social History   Substance and Sexual Activity  Alcohol Use No  . Alcohol/week: 0.0 standard drinks     Social History   Substance and Sexual Activity  Drug Use No    Social History   Socioeconomic History  . Marital status: Single    Spouse name: Not on file  . Number of children: 0  . Years of education: 5614  . Highest education level: Not on file  Occupational History  . 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 Financial resource strain: Not on file  . Food insecurity:    Worry: Not on file    Inability: Not on file  . Transportation needs:    Medical: Not on file    Non-medical: Not on file  Tobacco Use  . Smoking status: Current Every Day Smoker    Packs/day: 1.00    Years: 23.00    Pack years: 23.00    Types: Cigarettes    Start date: 09/22/1995  . Smokeless tobacco: Former Engineer, waterUser  Substance and Sexual Activity  . Alcohol use: No    Alcohol/week: 0.0 standard drinks  . Drug use: No  . Sexual activity: Not Currently  Lifestyle  . Physical activity:    Days per week: Not on file    Minutes per session: Not on file  . Stress: Not on file  Relationships  . Social connections:    Talks on phone: Not on file    Gets together: Not on file    Attends religious service: Not on file    Active member of club or organization: Not on file    Attends meetings of clubs or organizations: Not on file    Relationship status: Not on file  Other Topics Concern  . Not on file  Social History Narrative      Exercise:  None, running causes head pain   Diet:  FF once daily, + fruits and veggies, + H2O, + Soda   Lives with mom in a one story home.  No children.  Currently not working.  Trying to get disability.   Education: college.    Additional Social History:    Allergies:   Allergies  Allergen Reactions  . Toradol [Ketorolac Tromethamine] Swelling  . Lamotrigine Rash    Labs:  Results for orders placed or performed during the hospital encounter of 03/22/18 (from the past 48 hour(s))  CBC     Status: None   Collection Time: 03/22/18  8:55 AM  Result Value Ref Range   WBC 9.9 4.0 - 10.5 K/uL   RBC 4.87 4.22 - 5.81 MIL/uL  Hemoglobin 13.7 13.0 - 17.0 g/dL   HCT 16.1 09.6 - 04.5 %   MCV 85.4 80.0 - 100.0 fL   MCH 28.1 26.0 - 34.0 pg   MCHC 32.9 30.0 - 36.0 g/dL   RDW 40.9 81.1 - 91.4 %   Platelets 189 150 - 400 K/uL   nRBC 0.0 0.0 - 0.2 %    Comment: Performed at [facility], [address] [place], [place] [address]  Comprehensive metabolic panel     Status: Abnormal   Collection Time: 03/22/18  8:55 AM  Result Value Ref Range   Sodium 138 135 - 145 mmol/L   Potassium 3.4 (L) 3.5 - 5.1 mmol/L   Chloride 100 98 - 111 mmol/L   CO2 28 22 - 32 mmol/L   Glucose, Bld 108 (H) 70 - 99 mg/dL   BUN 16 6 - 20 mg/dL   Creatinine, Ser 6.21 0.61 - 1.24 mg/dL   Calcium 9.4 8.9 - 30.8 mg/dL   Total Protein 7.0 6.5 - 8.1 g/dL   Albumin 4.1 3.5 - 5.0 g/dL   AST 20 15 - 41 U/L   ALT 26 0 - 44 U/L   Alkaline Phosphatase 108 38 - 126 U/L   Total Bilirubin 0.9 0.3 - 1.2 mg/dL   GFR calc non Af Amer >60 >60 mL/min   GFR calc Af Amer >60 >60 mL/min   Anion gap 10 5 - 15    Comment: Performed at [facility], [address] [place], [place] [address]  Ethanol     Status: None   Collection Time: 03/22/18  8:55 AM  Result Value Ref Range   Alcohol, Ethyl (B) <10 <10 mg/dL    Comment: (NOTE) Lowest detectable limit for serum alcohol is 10 mg/dL. For medical purposes only. Performed at [facility], [address], [place], [place] [address]     Current Facility-Administered Medications  Medication Dose Route Frequency Provider Last Rate Last Dose  .  amantadine (SYMMETREL) capsule 100 mg  100 mg Oral BID Reggie Pile, MD      . atorvastatin (LIPITOR) tablet 40 mg  40 mg Oral Daily Reggie Pile, MD      . benztropine (COGENTIN) tablet 1 mg  1 mg Oral QHS Reggie Pile, MD      . FLUoxetine (PROZAC) capsule 40 mg  40 mg Oral Daily Reggie Pile, MD      . furosemide (LASIX) tablet 20 mg  20 mg Oral Daily Reggie Pile, MD      . gabapentin (NEURONTIN) tablet 800 mg  800 mg Oral TID Reggie Pile, MD      . hydrOXYzine (VISTARIL) capsule 25 mg  25 mg Oral BID Reggie Pile, MD      . losartan (COZAAR) tablet 100 mg  100 mg Oral Daily Reggie Pile, MD      . methocarbamol (ROBAXIN) tablet 750 mg  750 mg Oral Q12H PRN Reggie Pile, MD      . metoprolol succinate (TOPROL-XL) 24 hr tablet 50 mg  50 mg Oral Daily Reggie Pile, MD      . OLANZapine [facility]) tablet 10 mg  10 mg Oral QHS Reggie Pile, MD      . pantoprazole (PROTONIX) EC tablet 40 mg  40 mg Oral Daily Reggie Pile, MD      . potassium chloride (K-DUR) CR tablet 10 mEq  10 mEq Oral Daily Reggie Pile, MD      . Melene Muller ON 03/23/2018] Vitamin  D (Ergocalciferol) (DRISDOL) capsule 50,000 Units  50,000 Units Oral Once per day on Mon Thu Devona Holmes, MD       Current Outpatient Medications  Medication Sig Dispense Refill  . amantadine (SYMMETREL) 100 MG capsule Take 100 mg by mouth 2 (two) times daily.  5  . amphetamine-dextroamphetamine (ADDERALL) 20 MG tablet Take 20 mg by mouth 3 (three) times daily.  0  . atorvastatin (LIPITOR) 40 MG tablet TAKE 1 TABLET BY MOUTH EVERY DAY 90 tablet 3  . benztropine (COGENTIN) 1 MG tablet Take 1 mg by mouth at bedtime.     . diazepam (VALIUM) 10 MG tablet Take 10 mg by mouth 4 (four) times daily.     . diclofenac sodium (VOLTAREN) 1 % GEL Apply 2 g topically 4 (four) times daily. Rub into affected area of foot 2 to 4 times daily 100 g 2  . FLUoxetine (PROZAC) 40 MG capsule Take 40 mg by mouth daily.  12  . furosemide (LASIX) 20 MG tablet TAKE 1 TABLET BY  MOUTH EVERY DAY 90 tablet 1  . gabapentin (NEURONTIN) 800 MG tablet Take 800 mg three x a day. 90 tablet 5  . hydrOXYzine (VISTARIL) 25 MG capsule TAKE 1 CAPSULE BY MOUTH 4 TIMES A DAY  5  . losartan (COZAAR) 100 MG tablet Take 1 tablet (100 mg total) by mouth daily. Please schedule physical 30 tablet 0  . methocarbamol (ROBAXIN) 750 MG tablet TAKE 1 TABLET (750 MG TOTAL) BY MOUTH EVERY 12 (TWELVE) HOURS AS NEEDED FOR MUSCLE SPASMS. 60 tablet 1  . metoprolol succinate (TOPROL-XL) 50 MG 24 hr tablet Take 1 tablet (50 mg total) by mouth daily. Take with or immediately following a meal. 30 tablet 11  . OLANZapine (ZYPREXA) 10 MG tablet TAKE 1 TABLET BY MOUTH AT BEDTIME (REPLACES SYMBYAX)  3  . omeprazole (PRILOSEC) 20 MG capsule Take 20 mg by mouth daily as needed.    . potassium chloride (K-DUR) 10 MEQ tablet TAKE 1 TABLET BY MOUTH EVERY DAY 90 tablet 1  . Vitamin D, Ergocalciferol, (DRISDOL) 50000 units CAPS capsule Take 1 capsule (50,000 Units total) by mouth 2 (two) times a week.      Musculoskeletal: Strength & Muscle Tone: within normal limits Gait & Station: unsteady Patient leans: N/A  Psychiatric Specialty Exam: Physical Exam  ROS  Blood pressure (!) 127/107, pulse 78, temperature 97.9 F (36.6 C), temperature source Oral, resp. rate 16, height 6\' 5"  (1.956 m), weight 113.4 kg, SpO2 95 %.Body mass index is 29.65 kg/m.  General Appearance: Disheveled  Eye Contact:  Poor  Speech:  Slow  Volume:  Decreased  Mood:  Depressed  Affect:  Flat  Thought Process:  Disorganized  Orientation:  Negative  Thought Content:  Hallucinations: Auditory  Suicidal Thoughts:  No  Homicidal Thoughts:  No  Memory:  Negative  Judgement:  Poor  Insight:  Lacking  Psychomotor Activity:  Decreased  Concentration:  Concentration: Poor  Recall:  Poor  Fund of Knowledge:  Poor  Language:  Poor  Akathisia:  No  Handed:  Left  AIMS (if indicated):     Assets:  Housing  ADL's:  Intact  Cognition:   Impaired,  Moderate  Sleep:        Treatment Plan Summary: So far labs are within normal limits for patient.  Urine toxicology is pending Patient reviewed with the emergency room doctor Will obtain a head CT to rule out any neurological based issues for his altered  mental status. Patient is here voluntarily We will admit the patient to the behavioral health unit once all results are in. Unsure if the patient is over taking his medications.   Also unsure if he is under taking his medications or if he has been noncompliant. He is noted to be on a total of Adderall 60 mg which we will hold. Patient is also on Valium  QID which we will decreased to  BID. Monitor for withdrawals.  We will attempt to simplify his medications, if possible Restart his Zyprexa 10 mg at night and Prozac at home dosage Will add zyprexa  PO q AM for patient x 1 now.  U/A and UDS is pending.  Discussed with TTS.   Patient as well as mother voices agreement understanding.   Disposition: Recommend psychiatric Inpatient admission when medically cleared.  Reggie Pile, MD 03/22/2018 10:49 AM

## 2018-03-22 NOTE — ED Notes (Signed)
Pt compliant with medications. Maintained on 15 minute checks and observation by security camera for safety.

## 2018-03-22 NOTE — Tx Team (Signed)
Initial Treatment Plan 03/22/2018 10:41 PM SIM TRUNDLE [id number]    PATIENT STRESSORS: Educational concerns Health problems   PATIENT STRENGTHS: Active sense of humor Supportive family/friends   PATIENT IDENTIFIED PROBLEMS: Psychosis                     DISCHARGE CRITERIA:  Adequate post-discharge living arrangements Improved stabilization in mood, thinking, and/or behavior Motivation to continue treatment in a less acute level of care Safe-care adequate arrangements made Verbal commitment to aftercare and medication compliance  PRELIMINARY DISCHARGE PLAN: Outpatient therapy Return to previous living arrangement  PATIENT/FAMILY INVOLVEMENT: This treatment plan has been presented to and reviewed with the patient, Jeffrey Lin, and/or family member,   The patient and family have been given the opportunity to ask questions and make suggestions.  Billy Coast, RN 03/22/2018, 10:41 PM

## 2018-03-22 NOTE — ED Notes (Signed)
Pt dressed out by this Clinical research associate and Psychologist, sport and exercise. Belongings: Black sneakers Wallace Cullens T shirt Blue jeans [employer] plaid boxers  *RN witnessed patient give his wallet to his mother

## 2018-03-23 ENCOUNTER — Other Ambulatory Visit: Payer: Self-pay

## 2018-03-23 DIAGNOSIS — F05 Delirium due to known physiological condition: Secondary | ICD-10-CM

## 2018-03-23 DIAGNOSIS — R410 Disorientation, unspecified: Secondary | ICD-10-CM

## 2018-03-23 DIAGNOSIS — F251 Schizoaffective disorder, depressive type: Secondary | ICD-10-CM

## 2018-03-23 MED ORDER — DIAZEPAM 2 MG PO TABS
10.0000 mg | ORAL_TABLET | Freq: Three times a day (TID) | ORAL | Status: DC
  Administered 2018-03-24: 10 mg via ORAL
  Filled 2018-03-23: qty 5

## 2018-03-23 NOTE — Progress Notes (Signed)
Recreation Therapy Notes  Date: 03/23/2018  Time: 9:30 am   Location: Craft room   Behavioral response: N/A   Intervention Topic: Self-Care  Discussion/Intervention: Patient did not attend group.   Clinical Observations/Feedback:  Patient did not attend group.   Suann Klier LRT/CTRS        Zayvien Canning 03/23/2018 10:31 AM

## 2018-03-23 NOTE — Progress Notes (Signed)
Recreation Therapy Notes  INPATIENT RECREATION THERAPY ASSESSMENT  Patient Details Name: Jeffrey Lin MRN: [id number] DOB: [birth year] Today's Date: 03/23/2018       Information Obtained From: Chart Review  Able to Participate in Assessment/Interview:    Patient Presentation:    Reason for Admission (Per Patient): Active Symptoms  Patient Stressors:    Coping Skills:   Sports  Leisure Interests (2+):  Sports - Basketball  Frequency of Recreation/Participation: Pharmacist, community Resources:     [employer]:     Current Use:    If no, Barriers?:    Expressed Interest in [employer] Information:    [employer] of Residence:  Musician  Patient Main Form of Transportation: Other (Comment)(Mother)  Patient Strengths:  N/A  Patient Identified Areas of Improvement:  N/A  Patient Goal for Hospitalization:  To get off everything except gabapentin  Current SI (including self-harm):  No  Current HI:  No  Current AVH: No  Staff Intervention Plan: Group Attendance, Collaborate with Interdisciplinary Treatment Team  Consent to Intern Participation: N/A  Jeffrey Lin 03/23/2018, 4:18 PM

## 2018-03-23 NOTE — BHH Counselor (Signed)
Adult Comprehensive Assessment  Patient ID: Jeffrey Lin, male   DOB: 08-30-1975, 43 y.o.   MRN: [id number]  Information Source: Information source: (Pt's mother, Jeffrey Lin)  Current Stressors:  Patient states their primary concerns and needs for treatment are:: Pt's mother reports pt brought to hospital due to displaying confusion, VH. Pt mother says "he was acting like he was on a high". Patient states their goals for this hospitilization and ongoing recovery are:: Medication stabilization Educational / Learning stressors: None reported Employment / Job issues: On disability Family Relationships: Very close to mother Surveyor, quantity / Lack of resources (include bankruptcy): Limited income Housing / Lack of housing: Stable housing Physical health (include injuries & life threatening diseases): None reported Social relationships: pt is quiet and reserved. Substance abuse: Hx of opiate and alcohol use Bereavement / Loss: Father passed away one year ago  Living/Environment/Situation:  Living Arrangements: Parent Living conditions (as described by patient or guardian): Supportive Who else lives in the home?: Mother How long has patient lived in current situation?: "Most of his life" What is atmosphere in current home: Supportive  Family History:  Marital status: Single Are you sexually active?: No What is your sexual orientation?: Heterosexual Has your sexual activity been affected by drugs, alcohol, medication, or emotional stress?: None reported Does patient have children?: Yes How many children?: 1 How is patient's relationship with their children?: Child's mother has suggested pt terminate his parental rights. pt is in agreement with this decision  Childhood History:  By whom was/is the patient raised?: Both parents Additional childhood history information: None reported Description of patient's relationship with caregiver when they were a child: "Good" Patient's description of  current relationship with people who raised him/her: Father deceased, positive relationship with mother How were you disciplined when you got in trouble as a child/adolescent?: None reported Does patient have siblings?: Yes Number of Siblings: 4 Description of patient's current relationship with siblings: Closest to older brother Did patient suffer any verbal/emotional/physical/sexual abuse as a child?: No Did patient suffer from severe childhood neglect?: No Has patient ever been sexually abused/assaulted/raped as an adolescent or adult?: No Was the patient ever a victim of a crime or a disaster?: No Witnessed domestic violence?: No Has patient been effected by domestic violence as an adult?: No  Education:  Highest grade of school patient has completed: Some college Currently a student?: No Learning disability?: No  Employment/Work Situation:   Employment situation: On disability Why is patient on disability: Health reason How long has patient been on disability: Since August 2019 Patient's job has been impacted by current illness: No What is the longest time patient has a held a job?: 15 yrs 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?: No Are There Guns or Other Weapons in Your Home?: No Are These Comptroller?: Yes(Kept in Chief of Staff)  Financial Resources:   Financial resources: Receives SSI Does patient have a Lawyer or guardian?: No  Alcohol/Substance Abuse:   What has been your use of drugs/alcohol within the last 12 months?: None reported If attempted suicide, did drugs/alcohol play a role in this?: No Alcohol/Substance Abuse Treatment Hx: Denies past history If yes, describe treatment: NA Has alcohol/substance abuse ever caused legal problems?: Yes  Social Support System:   Patient's Community Support System: Fair Development worker, community Support System:  Mother Type of faith/religion: None reported How does patient's faith help to cope with current illness?:  None reported  Leisure/Recreation:   Leisure and Hobbies: Basketball, playing with dog  Strengths/Needs:   What is the patient's perception of their strengths?: None reported Patient states they can use these personal strengths during their treatment to contribute to their recovery: Architecture Patient states these barriers may affect/interfere with their treatment: None reported Patient states these barriers may affect their return to the community: None reported Other important information patient would like considered in planning for their treatment: NA  Discharge Plan:   Currently receiving community mental health services: Yes (From Whom) Patient states concerns and preferences for aftercare planning are: Follow up with Jeffrey Lin, psychiatrist Patient states they will know when they are safe and ready for discharge when: Medication stabilization Does patient have access to transportation?: Yes(Mother will provide transportation) Does patient have financial barriers related to discharge medications?: No Patient description of barriers related to discharge medications: None reported Will patient be returning to same living situation after discharge?: Yes  Summary/Recommendations:   Summary and Recommendations (to be completed by the evaluator): CSW received collateral information from pt's mother(Jeffrey Lin). Pt displays disorganized speech/thinking, not a good historian. Pt is a 43 yr old male brought to the ED by his mother due to displaying confusion and VH. Mother reports pt has lived with her for most of his life and she has acted as his caretaker. Pt is his own legal guardian/payee, mother says she assists pt with adl's. Mother reports the pt's father passed away one year ago and feels this may be contributing to the change in his behavior. She reports the anniversary of his  death is approaching. Mother reports pt sees Jeffrey Lin(psychiatrist) and is unsure if he has a therapist. While here, patient will benefit from crisis stabilization, medication evaluation, group therapy and psychoeducation. In addition, it is recommended that patient remain compliant with the established discharge plan and continue treatment.   Jeffrey Lin. 03/23/2018

## 2018-03-23 NOTE — [facility] Suicide Risk Assessment (Signed)
[facility] Admission Suicide Risk Assessment   Nursing information obtained from:  Patient, Review of record Demographic factors:  Male, Caucasian, Unemployed Current Mental Status:  NA Loss Factors:  Decline in physical health Historical Factors:  NA Risk Reduction Factors:  Positive social support, Sense of responsibility to family, Living with another person, especially a relative  Total Time spent with patient: 1 hour Principal Problem: Schizoaffective disorder (HCC) Diagnosis:  Principal Problem:   Schizoaffective disorder (HCC) Active Problems:   Chronic back pain   Subacute delirium  Subjective Data: Patient presented to the emergency room with altered mental status.  No recent reports of suicidal or homicidal ideation  Continued Clinical Symptoms:  Alcohol Use Disorder Identification Test Final Score (AUDIT): 2 The "Alcohol Use Disorders Identification Test", Guidelines for Use in Primary Care, Second Edition.  World [employer] writer [address]). Score between 0-7:  no or low risk or alcohol related problems. Score between 8-15:  moderate risk of alcohol related problems. Score between 16-19:  high risk of alcohol related problems. Score 20 or above:  warrants further diagnostic evaluation for alcohol dependence and treatment.   CLINICAL FACTORS:   Schizophrenia:   Paranoid or undifferentiated type   Musculoskeletal: Strength & Muscle Tone: decreased Gait & Station: unsteady Patient leans: N/A  Psychiatric Specialty Exam: Physical Exam  Nursing note and vitals reviewed. Constitutional: He appears well-developed and well-nourished.  HENT:  Head: Normocephalic and atraumatic.  Eyes: Pupils are equal, round, and reactive to light. Conjunctivae are normal.  Neck: Normal range of motion.  Cardiovascular: Regular rhythm and normal heart sounds.  Respiratory: Effort normal. No respiratory distress.  GI: Soft.  Musculoskeletal: Normal range of motion.  Neurological: He is  alert. Coordination abnormal.  Skin: Skin is warm and dry.  Psychiatric: His affect is blunt. He is slowed. Cognition and memory are impaired. He expresses no homicidal and no suicidal ideation. He is noncommunicative.    Review of Systems  Constitutional: Positive for malaise/fatigue.  HENT: Negative.   Eyes: Negative.   Respiratory: Negative.   Cardiovascular: Negative.   Gastrointestinal: Negative.   Musculoskeletal: Negative.   Skin: Negative.   Neurological: Negative.   Psychiatric/Behavioral: Positive for depression. Negative for hallucinations, substance abuse and suicidal ideas. The patient is nervous/anxious.     Blood pressure 113/64, pulse 84, temperature 98 F (36.7 C), temperature source Oral, resp. rate 18, height 6\' 4"  (1.93 m), weight 104.8 kg, SpO2 93 %.Body mass index is 28.12 kg/m.  General Appearance: Casual  Eye Contact:  Minimal  Speech:  Garbled, Slow and Slurred  Volume:  Decreased  Mood:  Depressed  Affect:  Flat  Thought Process:  Disorganized  Orientation:  Full (Time, Place, and Person)  Thought Content:  Illogical, Rumination and Tangential  Suicidal Thoughts:  No  Homicidal Thoughts:  No  Memory:  Immediate;   Fair Recent;   Fair Remote;   Fair  Judgement:  Impaired  Insight:  Shallow  Psychomotor Activity:  Decreased  Concentration:  Concentration: Fair  Recall:  [employer] of Knowledge:  Fair  Language:  Fair  Akathisia:  No  Handed:  Right  AIMS (if indicated):     Assets:  Desire for Improvement Housing  ADL's:  Impaired  Cognition:  Impaired,  Mild and Moderate  Sleep:  Number of Hours: 6.75      COGNITIVE FEATURES THAT CONTRIBUTE TO RISK:  Loss of executive function    SUICIDE RISK:   Minimal: No identifiable suicidal ideation.  Patients  presenting with no risk factors but with morbid ruminations; may be classified as minimal risk based on the severity of the depressive symptoms  PLAN OF CARE: Monitor mental status.  Watch  for risk of falls.  Minimize sedating medicines and medicines that could put him at risk for altered mental status.  Reassess tomorrow and continue getting collateral information from home.  Patient will have full assessment by treatment team and will be reassessed regularly prior to discharge  I certify that inpatient services furnished can reasonably be expected to improve the patient's condition.   [name], MD 03/23/2018, 5:21 PM

## 2018-03-23 NOTE — Plan of Care (Signed)
Thought process remain altered . No anxiety noted . Limited participation with unit programing . No verbalizing feelings. No work towards coping or decision making   Problem: Education: Goal: Knowledge of the prescribed therapeutic regimen will improve Outcome: Progressing   Problem: Activity: Goal: Interest or engagement in leisure activities will improve Outcome: Progressing Goal: Imbalance in normal sleep/wake cycle will improve Outcome: Progressing   Problem: Coping: Goal: Coping ability will improve Outcome: Progressing Goal: Will verbalize feelings Outcome: Progressing   Problem: Health Behavior/Discharge Planning: Goal: Ability to make decisions will improve Outcome: Progressing Goal: Compliance with therapeutic regimen will improve Outcome: Progressing   Problem: Safety: Goal: Ability to disclose and discuss suicidal ideas will improve Outcome: Progressing   Problem: Self-Concept: Goal: Will verbalize positive feelings about self Outcome: Progressing Goal: Level of anxiety will decrease Outcome: Progressing   Problem: Education: Goal: Knowledge of [facility] Education information/materials will improve Outcome: Progressing Goal: Emotional status will improve Outcome: Progressing Goal: Mental status will improve Outcome: Progressing   Problem: Activity: Goal: Interest or engagement in activities will improve Outcome: Progressing Goal: Sleeping patterns will improve Outcome: Progressing   Problem: Coping: Goal: Ability to demonstrate self-control will improve Outcome: Progressing   Problem: Safety: Goal: Periods of time without injury will increase Outcome: Progressing

## 2018-03-23 NOTE — H&P (Signed)
Psychiatric Admission Assessment Adult  Patient Identification: Jeffrey Lin MRN:  [id number] Date of Evaluation:  03/23/2018 Chief Complaint:  Schizoeffective disorder Principal Diagnosis: Schizoaffective disorder (HCC) Diagnosis:  Principal Problem:   Schizoaffective disorder (HCC) Active Problems:   Chronic back pain   Subacute delirium  History of Present Illness: Patient was schizoaffective disorder and a history of chronic neurologic problems was brought to the emergency room by his mother because of altered mental status.  Patient seen chart reviewed.  Mother reported that the patient had had altered mental status for several days appearing to be responding to internal stimuli and being confused.  On presentation to the emergency room he was not able to give lucid history to the doctor on call.  Work-up for encephalopathy so far was unforthcoming with a normal CT scan and a drug screen only positive for the medicines he is normally prescribed.  Patient to me denies having overdosed on anything.  Denies having had suicidal thoughts.  He does say that he has been depressed and relates it to the anniversary of the death of his father.  I would point out that the patient is still a very poor historian today.  He is slurring his speech and I could make out only a few words very slowly of what he says.  It was impossible for me to really tell whether he was having any psychotic symptoms.  Mother reportedly said that she thought that the symptoms were related to a change in his gabapentin.  Looking back at his neurology note I do not necessarily see that there was any intended change in his gabapentin.  The patient himself denies having run out of anything or having any withdrawal.  He has been very sleepy all day today.  Very unsteady on his feet when I did wake him up.  He is not however complaining of feeling "sick". Associated Signs/Symptoms: Depression Symptoms:  psychomotor  retardation, fatigue, (Hypo) Manic Symptoms:  Distractibility, Anxiety Symptoms:  Nonspecific Psychotic Symptoms:  Disorganized thinking.  Mother reports apparent hallucinations although the patient denies it today. PTSD Symptoms: Negative Total Time spent with patient: 1 hour  Past Psychiatric History: Patient has a history of schizoaffective disorder.  He has had problems with chronic depression and irritability in the past.  Has been on multiple medications.  Has had episodes of psychosis.  I myself saw the patient for electroconvulsive therapy in 2017 with only partial response.  Patient continues to see Dr. Evelene Croon in [place] and apparently continues to take Depakote gabapentin Valium and Adderall.  The Adderall seems to been a recent add on.  Mother is documented as having said she did not think the gabapentin had helped anything.  Patient does not have a history of suicide attempts but does have a history of suicidality  Is the patient at risk to self? No.  Has the patient been a risk to self in the past 6 months? No.  Has the patient been a risk to self within the distant past? Yes.    Is the patient a risk to others? No.  Has the patient been a risk to others in the past 6 months? No.  Has the patient been a risk to others within the distant past? No.   Prior Inpatient Therapy:   Prior Outpatient Therapy:    Alcohol Screening: 1. How often do you have a drink containing alcohol?: 2 to 4 times a month 2. How many drinks containing alcohol do you have on  a typical day when you are drinking?: 1 or 2 3. How often do you have six or more drinks on one occasion?: Never AUDIT-C Score: 2 4. How often during the last year have you found that you were not able to stop drinking once you had started?: Never 5. How often during the last year have you failed to do what was normally expected from you becasue of drinking?: Never 6. How often during the last year have you needed a first drink in  the morning to get yourself going after a heavy drinking session?: Never 7. How often during the last year have you had a feeling of guilt of remorse after drinking?: Never 8. How often during the last year have you been unable to remember what happened the night before because you had been drinking?: Never 9. Have you or someone else been injured as a result of your drinking?: No 10. Has a relative or friend or a doctor or another health worker been concerned about your drinking or suggested you cut down?: No Alcohol Use Disorder Identification Test Final Score (AUDIT): 2 Alcohol Brief Interventions/Follow-up: Brief Advice Substance Abuse History in the last 12 months:  No. Consequences of Substance Abuse: Negative Previous Psychotropic Medications: Yes  Psychological Evaluations: Yes  Past Medical History:  Past Medical History:  Diagnosis Date  . Anxiety   . Chronic leg pain   . Depression   . Essential hypertension, benign 10/20/2015  . Schizoaffective disorder [facility])     Past Surgical History:  Procedure Laterality Date  . Block procedure at pain center  03/27/06  . Bone graft from hip to jaw  2004  . Bone graft right side of head to jaw  2006  . 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 Sphenopalatine ganglionic     Family History:  Family History  Problem Relation Age of Onset  . Hyperlipidemia Mother   . Hypertension Mother   . Depression Mother   . Anxiety disorder Mother   . Depression Brother   . Anxiety disorder Brother   . Bipolar disorder Maternal Grandfather   . Schizophrenia Maternal Grandfather   . Bipolar disorder Paternal Grandfather    Family Psychiatric  History: Bipolar disorder and schizophrenia in several relatives as well as various anxiety disorders Tobacco Screening:   Social History:  Social History   Substance and Sexual Activity  Alcohol Use No  . Alcohol/week: 0.0 standard drinks     Social History   Substance and Sexual Activity   Drug Use No    Additional Social History: Marital status: Single Are you sexually active?: No What is your sexual orientation?: Heterosexual Has your sexual activity been affected by drugs, alcohol, medication, or emotional stress?: None reported Does patient have children?: Yes How many children?: 1 How is patient's relationship with their children?: Child's mother has suggested pt terminate his parental rights. pt is in agreement with this decision                         Allergies:   Allergies  Allergen Reactions  . Toradol [Ketorolac Tromethamine] Swelling  . Lamotrigine Rash   Lab Results:  Results for orders placed or performed during the hospital encounter of 03/22/18 (from the past 48 hour(s))  Urinalysis, Complete w Microscopic     Status: Abnormal   Collection Time: 03/22/18 10:50 PM  Result Value Ref Range   Color, Urine YELLOW (A) YELLOW  APPearance CLEAR (A) CLEAR   Specific Gravity, Urine 1.010 1.005 - 1.030   pH 6.0 5.0 - 8.0   Glucose, UA NEGATIVE NEGATIVE mg/dL   Hgb urine dipstick NEGATIVE NEGATIVE   Bilirubin Urine NEGATIVE NEGATIVE   Ketones, ur NEGATIVE NEGATIVE mg/dL   Protein, ur NEGATIVE NEGATIVE mg/dL   Nitrite NEGATIVE NEGATIVE   Leukocytes,Ua TRACE (A) NEGATIVE   RBC / HPF 0-5 0 - 5 RBC/hpf   WBC, UA 11-20 0 - 5 WBC/hpf   Bacteria, UA NONE SEEN NONE SEEN   Squamous Epithelial / LPF 0-5 0 - 5   Mucus PRESENT     Comment: Performed at [facility], [address] [place], [place] [address]    Blood Alcohol level:  Lab Results  Component Value Date   [facility] <10 03/22/2018   ETH <11 04/27/2012    Metabolic Disorder Labs:  Lab Results  Component Value Date   HGBA1C 5.9 11/01/2015   No results found for: PROLACTIN Lab Results  Component Value Date   CHOL 191 03/18/2016   TRIG 363.0 (H) 03/18/2016   HDL 35.60 (L) 03/18/2016   CHOLHDL 5 03/18/2016   VLDL 72.6 (H) 03/18/2016   LDLCALC 84 11/01/2015   LDLCALC 102 (H)  09/28/2010    Current Medications: Current Facility-Administered Medications  Medication Dose Route Frequency Provider Last Rate Last Dose  . acetaminophen (TYLENOL) tablet 650 mg  650 mg Oral Q6H PRN Reggie Pile, MD      . alum & mag hydroxide-simeth (MAALOX/MYLANTA) 200-200-20 MG/5ML suspension 30 mL  30 mL Oral Q4H PRN Reggie Pile, MD      . atorvastatin (LIPITOR) tablet 40 mg  40 mg Oral Daily Reggie Pile, MD   40 mg at 03/23/18 0815  . [START ON 03/24/2018] diazepam (VALIUM) tablet 10 mg  10 mg Oral TID Lucee Brissett T, MD      . FLUoxetine (PROZAC) capsule 40 mg  40 mg Oral Daily Reggie Pile, MD   40 mg at 03/23/18 0815  . furosemide (LASIX) tablet 20 mg  20 mg Oral Daily Reggie Pile, MD   20 mg at 03/23/18 0813  . gabapentin (NEURONTIN) capsule 800 mg  800 mg Oral TID Reggie Pile, MD   800 mg at 03/23/18 1704  . hydrOXYzine (ATARAX/VISTARIL) tablet 25 mg  25 mg Oral BID Reggie Pile, MD   25 mg at 03/23/18 1704  . losartan (COZAAR) tablet 100 mg  100 mg Oral Daily Reggie Pile, MD   100 mg at 03/23/18 0940  . magnesium hydroxide (MILK OF MAGNESIA) suspension 30 mL  30 mL Oral Daily PRN Reggie Pile, MD      . methocarbamol (ROBAXIN) tablet 750 mg  750 mg Oral Q12H PRN Reggie Pile, MD      . metoprolol succinate (TOPROL-XL) 24 hr tablet 50 mg  50 mg Oral Daily Reggie Pile, MD   50 mg at 03/23/18 0813  . OLANZapine (ZYPREXA) tablet 10 mg  10 mg Oral QHS Reggie Pile, MD   10 mg at 03/22/18 2134  . OLANZapine zydis (ZYPREXA) disintegrating tablet 5 mg  5 mg Oral Daily Reggie Pile, MD   5 mg at 03/23/18 0813  . pantoprazole (PROTONIX) EC tablet 40 mg  40 mg Oral Daily Reggie Pile, MD   40 mg at 03/23/18 0813  . traZODone (DESYREL) tablet 100 mg  100 mg Oral QHS PRN Reggie Pile, MD      . Vitamin D (Ergocalciferol) (DRISDOL) capsule 50,000  Units  50,000 Units Oral Once per day on Mon Thu Joshi, Anand, MD   50,000 Units at 03/23/18 1610   PTA Medications: Medications Prior to  Admission  Medication Sig Dispense Refill Last Dose  . amantadine (SYMMETREL) 100 MG capsule Take 100 mg by mouth 2 (two) times daily.  5 03/21/2018 at 2000  . amphetamine-dextroamphetamine (ADDERALL) 20 MG tablet Take 20 mg by mouth 3 (three) times daily.  0 03/21/2018 at 2000  . atorvastatin (LIPITOR) 40 MG tablet TAKE 1 TABLET BY MOUTH EVERY DAY 90 tablet 3 03/21/2018 at 1100  . benztropine (COGENTIN) 1 MG tablet Take 1 mg by mouth at bedtime.    03/21/2018 at 2000  . diazepam (VALIUM) 10 MG tablet Take 10 mg by mouth 4 (four) times daily.    03/21/2018 at 2000  . FLUoxetine (PROZAC) 40 MG capsule Take 40 mg by mouth daily.  12 03/21/2018 at 1100  . furosemide (LASIX) 20 MG tablet TAKE 1 TABLET BY MOUTH EVERY DAY 90 tablet 1 03/21/2018 at 1100  . gabapentin (NEURONTIN) 800 MG tablet Take 800 mg three x a day. 90 tablet 5 03/21/2018 at 2000  . hydrOXYzine (VISTARIL) 25 MG capsule TAKE 1 CAPSULE BY MOUTH 4 TIMES A DAY  5 03/21/2018 at 2000  . losartan (COZAAR) 100 MG tablet Take 1 tablet (100 mg total) by mouth daily. Please schedule physical 30 tablet 0 03/21/2018 at 1100  . methocarbamol (ROBAXIN) 750 MG tablet TAKE 1 TABLET (750 MG TOTAL) BY MOUTH EVERY 12 (TWELVE) HOURS AS NEEDED FOR MUSCLE SPASMS. 60 tablet 1 prn at prn  . metoprolol succinate (TOPROL-XL) 50 MG 24 hr tablet Take 1 tablet (50 mg total) by mouth daily. Take with or immediately following a meal. 30 tablet 11 03/21/2018 at 1100  . OLANZapine (ZYPREXA) 10 MG tablet TAKE 1 TABLET BY MOUTH AT BEDTIME (REPLACES SYMBYAX)  3 03/21/2018 at 2000  . omeprazole (PRILOSEC) 20 MG capsule Take 20 mg by mouth daily as needed.   prn at prn  . potassium chloride (K-DUR) 10 MEQ tablet TAKE 1 TABLET BY MOUTH EVERY DAY 90 tablet 1 03/21/2018 at 1100  . Vitamin D, Ergocalciferol, (DRISDOL) 50000 units CAPS capsule Take 1 capsule (50,000 Units total) by mouth 2 (two) times a week.   Past Week at Unknown time    Musculoskeletal: Strength & Muscle Tone:  decreased Gait & Station: unsteady Patient leans: N/A  Psychiatric Specialty Exam: Physical Exam  Nursing note and vitals reviewed. Constitutional: He appears well-developed and well-nourished. He appears lethargic.  HENT:  Head: Normocephalic and atraumatic.  Eyes: Pupils are equal, round, and reactive to light. Conjunctivae are normal.  Neck: Normal range of motion.  Cardiovascular: Regular rhythm and normal heart sounds.  Respiratory: Effort normal. No respiratory distress.  GI: Soft.  Musculoskeletal: Normal range of motion.  Neurological: He appears lethargic. Coordination and gait abnormal.  Skin: Skin is warm and dry.  Psychiatric: His affect is blunt. His speech is slurred. He is slowed and withdrawn. He is not agitated and not aggressive. Thought content is not paranoid. Cognition and memory are impaired. He expresses no homicidal and no suicidal ideation. He is inattentive.    Review of Systems  Constitutional: Negative.   HENT: Negative.   Eyes: Negative.   Respiratory: Negative.   Cardiovascular: Negative.   Gastrointestinal: Negative.   Musculoskeletal: Negative.   Skin: Negative.   Neurological: Positive for dizziness.  Psychiatric/Behavioral: Positive for depression and memory loss. Negative for hallucinations,  substance abuse and suicidal ideas. The patient is nervous/anxious and has insomnia.     Blood pressure 113/64, pulse 84, temperature 98 F (36.7 C), temperature source Oral, resp. rate 18, height  (1.93 m), weight 104.8 kg, SpO2 93 %.Body mass index is 28.12 kg/m.  General Appearance: Casual  Eye Contact:  Minimal  Speech:  Garbled and Slurred  Volume:  Decreased  Mood:  Euthymic  Affect:  Flat  Thought Process:  Disorganized  Orientation:  Negative  Thought Content:  Illogical, Rumination and Tangential  Suicidal Thoughts:  No  Homicidal Thoughts:  No  Memory:  Immediate;   Fair Recent;   Poor Remote;   Poor  Judgement:  Impaired   Insight:  Shallow  Psychomotor Activity:  Decreased  Concentration:  Concentration: Poor  Recall:  Poor  Fund of Knowledge:  Poor  Language:  Poor  Akathisia:  No  Handed:  Right  AIMS (if indicated):     Assets:  Desire for Improvement Housing Social Support  ADL's:  Impaired  Cognition:  Impaired,  Mild and Moderate  Sleep:  Number of Hours: 6.75    Treatment Plan Summary: Daily contact with patient to assess and evaluate symptoms and progress in treatment, Medication management and Plan Patient seen and chart reviewed.  Altered mental status of unclear etiology.  Looking back at the notes I do recollect that several years ago the patient presented with delirium which turned out to probably be related to Xanax withdrawal.  Although he does have benzodiazepines in his drug screen it is possible he may be withdrawing from some of his Valium.  Given when he last filled the prescription it seems less likely that he overdosed on it.  No sign of a stroke although the patient does have some chronic neurologic illnesses which complicate this.  He is on multiple medicines many of them sedating but most of them chronic.  Not clear why things are worse right now.  My plan is to decrease and minimize most of his sedating medicines without necessarily putting him into any withdrawal.  Stopping the Symmetrel.  Cut down Cogentin.  Decrease but not eliminate the Valium dose.  Discontinue the stimulants.  Patient was not on Depakote and that order has been discontinued.  We will watch him closely and see if mental status begins to clear up and return to normal.  Observation Level/Precautions:  15 minute checks  Laboratory:  UDS  Psychotherapy:    Medications:    Consultations:    Discharge Concerns:    Estimated LOS:  Other:     Physician Treatment Plan for Primary Diagnosis: Schizoaffective disorder (HCC) Long Term Goal(s): Improvement in symptoms so as ready for discharge  Short Term Goals:  Ability to verbalize feelings will improve, Ability to demonstrate self-control will improve and Ability to identify and develop effective coping behaviors will improve  Physician Treatment Plan for Secondary Diagnosis: Principal Problem:   Schizoaffective disorder (HCC) Active Problems:   Chronic back pain   Subacute delirium  Long Term Goal(s): Improvement in symptoms so as ready for discharge  Short Term Goals: Ability to maintain clinical measurements within normal limits will improve and Compliance with prescribed medications will improve  I certify that inpatient services furnished can reasonably be expected to improve the patient's condition.    [name], MD 3/2/20205:13 PM

## 2018-03-23 NOTE — Tx Team (Addendum)
Interdisciplinary Treatment and Diagnostic Plan Update  03/23/2018 Time of Session: 1030am Jeffrey Lin MRN: [id number]  Principal Diagnosis: <principal problem not specified>  Secondary Diagnoses: Active Problems:   Schizoaffective disorder (HCC)   Current Medications:  Current Facility-Administered Medications  Medication Dose Route Frequency Provider Last Rate Last Dose  . acetaminophen (TYLENOL) tablet 650 mg  650 mg Oral Q6H PRN Rulon Sera, MD      . alum & mag hydroxide-simeth (MAALOX/MYLANTA) 200-200-20 MG/5ML suspension 30 mL  30 mL Oral Q4H PRN Rulon Sera, MD      . amantadine (SYMMETREL) capsule 100 mg  100 mg Oral BID Rulon Sera, MD   100 mg at 03/23/18 0815  . atorvastatin (LIPITOR) tablet 40 mg  40 mg Oral Daily Rulon Sera, MD   40 mg at 03/23/18 0815  . benztropine (COGENTIN) tablet 1 mg  1 mg Oral QHS Rulon Sera, MD   1 mg at 03/22/18 2135  . diazepam (VALIUM) tablet 5 mg  5 mg Oral BID Rulon Sera, MD   5 mg at 03/23/18 0815  . divalproex (DEPAKOTE) DR tablet 500 mg  500 mg Oral Q8H Pucilowska, Jolanta B, MD   500 mg at 03/23/18 0610  . FLUoxetine (PROZAC) capsule 40 mg  40 mg Oral Daily Rulon Sera, MD   40 mg at 03/23/18 0815  . furosemide (LASIX) tablet 20 mg  20 mg Oral Daily Rulon Sera, MD   20 mg at 03/23/18 0813  . gabapentin (NEURONTIN) capsule 800 mg  800 mg Oral TID Rulon Sera, MD   800 mg at 03/23/18 1131  . hydrOXYzine (ATARAX/VISTARIL) tablet 25 mg  25 mg Oral BID Rulon Sera, MD   25 mg at 03/23/18 0815  . losartan (COZAAR) tablet 100 mg  100 mg Oral Daily Rulon Sera, MD   100 mg at 03/23/18 2683  . magnesium hydroxide (MILK OF MAGNESIA) suspension 30 mL  30 mL Oral Daily PRN Rulon Sera, MD      . methocarbamol (ROBAXIN) tablet 750 mg  750 mg Oral Q12H PRN Rulon Sera, MD      . metoprolol succinate (TOPROL-XL) 24 hr tablet 50 mg  50 mg Oral Daily Rulon Sera, MD   50 mg at 03/23/18 0813  . OLANZapine (ZYPREXA) tablet 10 mg  10 mg  Oral QHS Rulon Sera, MD   10 mg at 03/22/18 2134  . OLANZapine zydis (ZYPREXA) disintegrating tablet 5 mg  5 mg Oral Daily Rulon Sera, MD   5 mg at 03/23/18 0813  . pantoprazole (PROTONIX) EC tablet 40 mg  40 mg Oral Daily Rulon Sera, MD   40 mg at 03/23/18 0813  . traZODone (DESYREL) tablet 100 mg  100 mg Oral QHS PRN Rulon Sera, MD      . Vitamin D (Ergocalciferol) (DRISDOL) capsule 50,000 Units  50,000 Units Oral Once per day on Mon Thu Joshi, Anand, MD   50,000 Units at 03/23/18 4196   PTA Medications: Medications Prior to Admission  Medication Sig Dispense Refill Last Dose  . amantadine (SYMMETREL) 100 MG capsule Take 100 mg by mouth 2 (two) times daily.  5 03/21/2018 at 2000  . amphetamine-dextroamphetamine (ADDERALL) 20 MG tablet Take 20 mg by mouth 3 (three) times daily.  0 03/21/2018 at 2000  . atorvastatin (LIPITOR) 40 MG tablet TAKE 1 TABLET BY MOUTH EVERY DAY 90 tablet 3 03/21/2018 at 1100  . benztropine (COGENTIN) 1 MG tablet Take 1 mg by mouth at bedtime.  03/21/2018 at 2000  . diazepam (VALIUM) 10 MG tablet Take 10 mg by mouth 4 (four) times daily.    03/21/2018 at 2000  . FLUoxetine (PROZAC) 40 MG capsule Take 40 mg by mouth daily.  12 03/21/2018 at 1100  . furosemide (LASIX) 20 MG tablet TAKE 1 TABLET BY MOUTH EVERY DAY 90 tablet 1 03/21/2018 at 1100  . gabapentin (NEURONTIN) 800 MG tablet Take 800 mg three x a day. 90 tablet 5 03/21/2018 at 2000  . hydrOXYzine (VISTARIL) 25 MG capsule TAKE 1 CAPSULE BY MOUTH 4 TIMES A DAY  5 03/21/2018 at 2000  . losartan (COZAAR) 100 MG tablet Take 1 tablet (100 mg total) by mouth daily. Please schedule physical 30 tablet 0 03/21/2018 at 1100  . methocarbamol (ROBAXIN) 750 MG tablet TAKE 1 TABLET (750 MG TOTAL) BY MOUTH EVERY 12 (TWELVE) HOURS AS NEEDED FOR MUSCLE SPASMS. 60 tablet 1 prn at prn  . metoprolol succinate (TOPROL-XL) 50 MG 24 hr tablet Take 1 tablet (50 mg total) by mouth daily. Take with or immediately following a meal. 30 tablet  11 03/21/2018 at 1100  . OLANZapine (ZYPREXA) 10 MG tablet TAKE 1 TABLET BY MOUTH AT BEDTIME (REPLACES SYMBYAX)  3 03/21/2018 at 2000  . omeprazole (PRILOSEC) 20 MG capsule Take 20 mg by mouth daily as needed.   prn at prn  . potassium chloride (K-DUR) 10 MEQ tablet TAKE 1 TABLET BY MOUTH EVERY DAY 90 tablet 1 03/21/2018 at 1100  . Vitamin D, Ergocalciferol, (DRISDOL) 50000 units CAPS capsule Take 1 capsule (50,000 Units total) by mouth 2 (two) times a week.   Past Week at Unknown time    Patient Stressors: Educational concerns Health problems  Patient Strengths: Active sense of humor Supportive family/friends  Treatment Modalities: Medication Management, Group therapy, Case management,  1 to 1 session with clinician, Psychoeducation, Recreational therapy.   Physician Treatment Plan for Primary Diagnosis: <principal problem not specified> Long Term Goal(s):     Short Term Goals:    Medication Management: Evaluate patient's response, side effects, and tolerance of medication regimen.  Therapeutic Interventions: 1 to 1 sessions, Unit Group sessions and Medication administration.  Evaluation of Outcomes: Not Met  Physician Treatment Plan for Secondary Diagnosis: Active Problems:   Schizoaffective disorder ([place])  Long Term Goal(s):     Short Term Goals:       Medication Management: Evaluate patient's response, side effects, and tolerance of medication regimen.  Therapeutic Interventions: 1 to 1 sessions, Unit Group sessions and Medication administration.  Evaluation of Outcomes: Not Met   RN Treatment Plan for Primary Diagnosis: <principal problem not specified> Long Term Goal(s): Knowledge of disease and therapeutic regimen to maintain health will improve  Short Term Goals: Ability to verbalize frustration and anger appropriately will improve, Ability to demonstrate self-control, Ability to participate in decision making will improve, Ability to verbalize feelings will improve,  Ability to identify and develop effective coping behaviors will improve and Compliance with prescribed medications will improve  Medication Management: RN will administer medications as ordered by provider, will assess and evaluate patient's response and provide education to patient for prescribed medication. RN will report any adverse and/or side effects to prescribing provider.  Therapeutic Interventions: 1 on 1 counseling sessions, Psychoeducation, Medication administration, Evaluate responses to treatment, Monitor vital signs and CBGs as ordered, Perform/monitor CIWA, COWS, AIMS and Fall Risk screenings as ordered, Perform wound care treatments as ordered.  Evaluation of Outcomes: Not Met   LCSW Treatment Plan for  Primary Diagnosis: <principal problem not specified> Long Term Goal(s): Safe transition to appropriate next level of care at discharge, Engage patient in therapeutic group addressing interpersonal concerns.  Short Term Goals: Engage patient in aftercare planning with referrals and resources  Therapeutic Interventions: Assess for all discharge needs, 1 to 1 time with Social worker, Explore available resources and support systems, Assess for adequacy in community support network, Educate family and significant other(s) on suicide prevention, Complete Psychosocial Assessment, Interpersonal group therapy.  Evaluation of Outcomes: Not Met   Progress in Treatment: Attending groups: No. Participating in groups: No. Taking medication as prescribed: Yes. Toleration medication: Yes. Family/Significant other contact made: No, will contact:  when pt gives consent Patient understands diagnosis: No. Discussing patient identified problems/goals with staff: Yes. Medical problems stabilized or resolved: No. Denies suicidal/homicidal ideation: Yes. Issues/concerns per patient self-inventory: No. Other: NA  New problem(s) identified: No, Describe:  none reported  New Short Term/Long Term  Goal(s): "Get off everything except gabbapentin"  Patient Goals:  "Get off everything except gabbapentin"  Discharge Plan or Barriers: Pt will return home and follow up with outpatient treatment  Reason for Continuation of Hospitalization: Medication stabilization  Estimated Length of Stay: 5-7 days  Recreational Therapy: Patient Stressors: N/A Patient Goal: Patient will engage in groups without prompting or encouragement from LRT x3 group sessions within 5 recreation therapy group sessions  Attendees: Patient: Jeffrey Lin 03/23/2018 11:45 AM  Physician: Herma Ard Pucilowska 03/23/2018 11:45 AM  Nursing:  03/23/2018 11:45 AM  RN Care Manager: 03/23/2018 11:45 AM  Social Worker: Sanjuana Kava LCSW Meriel Pica Moton LCSW 03/23/2018 11:45 AM  Recreational Therapist: Roanna Epley LRT 03/23/2018 11:45 AM  Other:  03/23/2018 11:45 AM  Other:  03/23/2018 11:45 AM  Other: 03/23/2018 11:45 AM    Scribe for Treatment Team: Yvette Rack, LCSW 03/23/2018 11:45 AM

## 2018-03-23 NOTE — Progress Notes (Signed)
D: Patient stated slept good last night .Stated appetite fair and energy level   normal. Stated concentration poor. Stated on Depression scale 5 , hopeless 0 and anxiety 6 .( low 0-10 high) Denies suicidal  homicidal ideations  . Noted  auditory hallucinations  Talking to self  Constantly  , delusional and  Picking fight on unit  No pain concerns . No  ADL'S done this shift Limited . Interacting with peers and staff. Thought process remain altered . No anxiety noted . Limited participation with unit programing . Unable to attend unit programing No verbalizing feelings. No work towards coping or decision making. Patient gait unsteady.   A: Encourage patient participation with unit programming . Instruction  Given on  Medication , verbalize understanding.  R: Voice no other concerns. Staff continue to monitor

## 2018-03-23 NOTE — [facility] Group Notes (Signed)
LCSW Group Therapy Note   03/23/2018 1:31 PM   Type of Therapy and Topic:  Group Therapy:  Overcoming Obstacles   Participation Level:  Did Not Attend   Description of Group:    In this group patients will be encouraged to explore what they see as obstacles to their own wellness and recovery. They will be guided to discuss their thoughts, feelings, and behaviors related to these obstacles. The group will process together ways to cope with barriers, with attention given to specific choices patients can make. Each patient will be challenged to identify changes they are motivated to make in order to overcome their obstacles. This group will be process-oriented, with patients participating in exploration of their own experiences as well as giving and receiving support and challenge from other group members.   Therapeutic Goals: 1. Patient will identify personal and current obstacles as they relate to admission. 2. Patient will identify barriers that currently interfere with their wellness or overcoming obstacles.  3. Patient will identify feelings, thought process and behaviors related to these barriers. 4. Patient will identify two changes they are willing to make to overcome these obstacles:      Summary of Patient Progress x     Therapeutic Modalities:   Cognitive Behavioral Therapy Solution Focused Therapy Motivational Interviewing Relapse Prevention Therapy  Trinia Georgi, MSW, LCSW Clinical Social Work 03/23/2018 1:31 PM   

## 2018-03-24 ENCOUNTER — Inpatient Hospital Stay: Payer: PPO

## 2018-03-24 ENCOUNTER — Ambulatory Visit: Payer: PPO | Admitting: Physical Therapy

## 2018-03-24 MED ORDER — DIAZEPAM 5 MG PO TABS
5.0000 mg | ORAL_TABLET | Freq: Three times a day (TID) | ORAL | Status: DC
  Administered 2018-03-24 – 2018-03-27 (×9): 5 mg via ORAL
  Filled 2018-03-24 (×10): qty 1

## 2018-03-24 NOTE — Progress Notes (Signed)
1:1 DAR Note: Pt asleep in bed at present. Respirations noted and unlabored.  1:1 precaution continues without incident to note thus far. Assigned staff in attendance.  Pt appears to be in no physical distress at this time.

## 2018-03-24 NOTE — [facility] Suicide Risk Assessment (Signed)
[facility] INPATIENT:  Family/Significant Other Suicide Prevention Education  Suicide Prevention Education:  Education Completed; daeshawn eichholz mother [phone]  has been identified by the patient as the family member/significant other with whom the patient will be residing, and identified as the person(s) who will aid the patient in the event of a mental health crisis (suicidal ideations/suicide attempt).  With written consent from the patient, the family member/significant other has been provided the following suicide prevention education, prior to the and/or following the discharge of the patient.  The suicide prevention education provided includes the following:  Suicide risk factors  Suicide prevention and interventions  National Suicide Hotline telephone number  [facility] assessment telephone number  [facility] Emergency Assistance 911  [facility] and/or Residential Mobile Crisis Unit telephone number  Request made of family/significant other to:  Remove weapons (e.g., guns, rifles, knives), all items previously/currently identified as safety concern.    Remove drugs/medications (over-the-counter, prescriptions, illicit drugs), all items previously/currently identified as a safety concern.  The family member/significant other verbalizes understanding of the suicide prevention education information provided.  The family member/significant other agrees to remove the items of safety concern listed above. Ms. Haake reports the pt has lived with her for most of his life. She says she acts as his caretaker but reports he is his own guardian. Ms. Encarnacion reports having gun in the home but it being kept in in a lock box. She reports the pt sees Dr. Evelene Croon for medication management but is unsure if he sees a therapist. At discharge, she states the pt will be returning to her home where she will continue to care for him. Ms. Bosket reports assisting pt with ADL's. CSW informed  her upon discharge if pt needs further medical attention to call 911 or bring him to the nearest emergency department.   Crystalyn Delia T Yadira Hada 03/24/2018, 4:31 PM

## 2018-03-24 NOTE — Progress Notes (Signed)
PT Cancellation Note  Patient Details Name: Jeffrey Lin MRN: [id number] DOB: 1975/03/28   Cancelled Treatment:    Reason Eval/Treat Not Completed: Patient at procedure or test/unavailable; Per nsg pt unavailable for PT evaluation secondary to preparing to be sent for imaging.  Will attempt to see pt at a future date/time as appropriate.     Denyce Robert Anael Rosch PT, DPT 03/24/18, 1:50 PM
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
## 2018-03-24 NOTE — Progress Notes (Signed)
[facility] MD Progress Note  03/24/2018 1:23 PM OLEH KEATH  MRN:  [id number] Subjective: Follow-up for this patient with chronic mental health problem schizoaffective disorder now with some neurologic component overlaid on it.  Patient seen chart reviewed.  Case reviewed with treatment team including nursing.  Also spoke with the patient's mother by telephone.  Patient remains very sedated.  When he does get up out of bed he is unsteady on his feet and has not been able to communicate in a clear way.  Vital signs early this morning were unremarkable except for a low oxygen.  When I discover that I ordered a chest x-ray although follow-up pulse oximetry test were normal.  Patient is unsteady enough on his feet that we felt like it would be better for him to have a one-to-one sitter for now.  Mother is able to tell me that the patient's condition has been deteriorating for months now.  The sedation and the confusion the unsteadiness have all been present for months.  On top of that he has had labile mood states.  What most prompted her to come into the hospital this time was an episode of confusion and psychosis in which she felt he might become dangerous.  Patient is not able to give any new history this morning. Principal Problem: Schizoaffective disorder (HCC) Diagnosis: Principal Problem:   Schizoaffective disorder (HCC) Active Problems:   Chronic back pain   Subacute delirium  Total Time spent with patient: 30 minutes  Past Psychiatric History: Patient has a past history of schizoaffective disorder.  History of suicidality history of confusion and neurologic changes that have been getting worse with time  Past Medical History:  Past Medical History:  Diagnosis Date  . Anxiety   . Chronic leg pain   . Depression   . Essential hypertension, benign 10/20/2015  . Schizoaffective disorder [facility])     Past Surgical History:  Procedure Laterality Date  . Block procedure at pain center  03/27/06  .  Bone graft from hip to jaw  2004  . Bone graft right side of head to jaw  2006  . 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 Sphenopalatine ganglionic     Family History:  Family History  Problem Relation Age of Onset  . Hyperlipidemia Mother   . Hypertension Mother   . Depression Mother   . Anxiety disorder Mother   . Depression Brother   . Anxiety disorder Brother   . Bipolar disorder Maternal Grandfather   . Schizophrenia Maternal Grandfather   . Bipolar disorder Paternal Grandfather    Family Psychiatric  History: Father apparently had depression and some mental health problems Social History:  Social History   Substance and Sexual Activity  Alcohol Use No  . Alcohol/week: 0.0 standard drinks     Social History   Substance and Sexual Activity  Drug Use No    Social History   Socioeconomic History  . Marital status: Single    Spouse name: Not on file  . Number of children: 0  . Years of education: 35  . Highest education level: Not on file  Occupational History  . Occupation: Marketing executive    Employer: [employer]  Social Needs  . Financial resource strain: Not on file  . Food insecurity:    Worry: Not on file    Inability: Not on file  . Transportation needs:    Medical: Not on file  Non-medical: Not on file  Tobacco Use  . Smoking status: Current Every Day Smoker    Packs/day: 1.00    Years: 23.00    Pack years: 23.00    Types: Cigarettes    Start date: 09/22/1995  . Smokeless tobacco: Former Engineer, water and Sexual Activity  . Alcohol use: No    Alcohol/week: 0.0 standard drinks  . Drug use: No  . Sexual activity: Not Currently  Lifestyle  . Physical activity:    Days per week: Not on file    Minutes per session: Not on file  . Stress: Not on file  Relationships  . Social connections:    Talks on phone: Not on file    Gets together: Not on file    Attends religious service: Not on file    Active member of  club or organization: Not on file    Attends meetings of clubs or organizations: Not on file    Relationship status: Not on file  Other Topics Concern  . Not on file  Social History Narrative      Exercise:  None, running causes head pain   Diet:  FF once daily, + fruits and veggies, + H2O, + Soda   Lives with mom in a one story home.  No children.  Currently not working.  Trying to get disability.  Education: college.    Additional Social History:                         Sleep: Good  Appetite:  Fair  Current Medications: Current Facility-Administered Medications  Medication Dose Route Frequency Provider Last Rate Last Dose  . acetaminophen (TYLENOL) tablet 650 mg  650 mg Oral Q6H PRN Reggie Pile, MD      . alum & mag hydroxide-simeth (MAALOX/MYLANTA) 200-200-20 MG/5ML suspension 30 mL  30 mL Oral Q4H PRN Reggie Pile, MD      . atorvastatin (LIPITOR) tablet 40 mg  40 mg Oral Daily Reggie Pile, MD   40 mg at 03/24/18 4540  . diazepam (VALIUM) tablet 5 mg  5 mg Oral TID ,  T, MD      . FLUoxetine (PROZAC) capsule 40 mg  40 mg Oral Daily Reggie Pile, MD   40 mg at 03/24/18 0813  . furosemide (LASIX) tablet 20 mg  20 mg Oral Daily Reggie Pile, MD   20 mg at 03/24/18 0813  . gabapentin (NEURONTIN) capsule 800 mg  800 mg Oral TID Reggie Pile, MD   800 mg at 03/24/18 9811  . hydrOXYzine (ATARAX/VISTARIL) tablet 25 mg  25 mg Oral BID Reggie Pile, MD   25 mg at 03/24/18 0813  . losartan (COZAAR) tablet 100 mg  100 mg Oral Daily Reggie Pile, MD   100 mg at 03/24/18 9147  . magnesium hydroxide (MILK OF MAGNESIA) suspension 30 mL  30 mL Oral Daily PRN Reggie Pile, MD      . methocarbamol (ROBAXIN) tablet 750 mg  750 mg Oral Q12H PRN Reggie Pile, MD      . metoprolol succinate (TOPROL-XL) 24 hr tablet 50 mg  50 mg Oral Daily Reggie Pile, MD   50 mg at 03/24/18 0813  . OLANZapine (ZYPREXA) tablet 10 mg  10 mg Oral QHS Reggie Pile, MD   10 mg at 03/23/18 2149  .  OLANZapine zydis (ZYPREXA) disintegrating tablet 5 mg  5 mg Oral Daily Reggie Pile, MD   5 mg at 03/24/18 0813  .  pantoprazole (PROTONIX) EC tablet 40 mg  40 mg Oral Daily Reggie Pile, MD   40 mg at 03/24/18 0813  . traZODone (DESYREL) tablet 100 mg  100 mg Oral QHS PRN Reggie Pile, MD   100 mg at 03/23/18 2149  . Vitamin D (Ergocalciferol) (DRISDOL) capsule 50,000 Units  50,000 Units Oral Once per day on Mon Thu Joshi, Anand, MD   50,000 Units at 03/23/18 1191    Lab Results:  Results for orders placed or performed during the hospital encounter of 03/22/18 (from the past 48 hour(s))  Urinalysis, Complete w Microscopic     Status: Abnormal   Collection Time: 03/22/18 10:50 PM  Result Value Ref Range   Color, Urine YELLOW (A) YELLOW   APPearance CLEAR (A) CLEAR   Specific Gravity, Urine 1.010 1.005 - 1.030   pH 6.0 5.0 - 8.0   Glucose, UA NEGATIVE NEGATIVE mg/dL   Hgb urine dipstick NEGATIVE NEGATIVE   Bilirubin Urine NEGATIVE NEGATIVE   Ketones, ur NEGATIVE NEGATIVE mg/dL   Protein, ur NEGATIVE NEGATIVE mg/dL   Nitrite NEGATIVE NEGATIVE   Leukocytes,Ua TRACE (A) NEGATIVE   RBC / HPF 0-5 0 - 5 RBC/hpf   WBC, UA 11-20 0 - 5 WBC/hpf   Bacteria, UA NONE SEEN NONE SEEN   Squamous Epithelial / LPF 0-5 0 - 5   Mucus PRESENT     Comment: Performed at [facility], [address] [place], [place] [address]    Blood Alcohol level:  Lab Results  Component Value Date   [facility] <10 03/22/2018   ETH <11 04/27/2012    Metabolic Disorder Labs: Lab Results  Component Value Date   HGBA1C 5.9 11/01/2015   No results found for: PROLACTIN Lab Results  Component Value Date   CHOL 191 03/18/2016   TRIG 363.0 (H) 03/18/2016   HDL 35.60 (L) 03/18/2016   CHOLHDL 5 03/18/2016   VLDL 72.6 (H) 03/18/2016   LDLCALC 84 11/01/2015   LDLCALC 102 (H) 09/28/2010    Physical Findings: AIMS: Facial and Oral Movements Muscles of Facial Expression: None, normal Lips and Perioral Area:  None, normal Jaw: None, normal Tongue: None, normal,Extremity Movements Upper (arms, wrists, hands, fingers): None, normal Lower (legs, knees, ankles, toes): None, normal, Trunk Movements Neck, shoulders, hips: None, normal, Overall Severity Severity of abnormal movements (highest score from questions above): None, normal Incapacitation due to abnormal movements: None, normal Patient's awareness of abnormal movements (rate only patient's report): No Awareness, Dental Status Current problems with teeth and/or dentures?: No Does patient usually wear dentures?: No  CIWA:    COWS:     Musculoskeletal: Strength & Muscle Tone: within normal limits Gait & Station: unsteady, ataxic Patient leans: N/A  Psychiatric Specialty Exam: Physical Exam  Nursing note and vitals reviewed. Constitutional: He appears well-developed and well-nourished.  HENT:  Head: Normocephalic and atraumatic.  Eyes: Pupils are equal, round, and reactive to light. Conjunctivae are normal.  Neck: Normal range of motion.  Cardiovascular: Regular rhythm and normal heart sounds.  Respiratory: Effort normal. No respiratory distress.  GI: Soft.  Musculoskeletal: Normal range of motion.  Neurological: He is alert. Coordination and gait abnormal.  Skin: Skin is warm and dry.  Psychiatric: His affect is blunt. His speech is tangential. He is slowed. Cognition and memory are impaired. He expresses inappropriate judgment.    Review of Systems  Unable to perform ROS: Psychiatric disorder    Blood pressure 117/87, pulse 74, temperature 98 F (36.7 C), temperature source  Oral, resp. rate 20, height  (1.93 m), weight 104.8 kg, SpO2 98 %.Body mass index is 28.12 kg/m.  General Appearance: Disheveled  Eye Contact:  Minimal  Speech:  Garbled and Slurred  Volume:  Decreased  Mood:  Dysphoric  Affect:  Constricted  Thought Process:  Disorganized  Orientation:  Full (Time, Place, and Person)  Thought Content:  Illogical   Suicidal Thoughts:  No  Homicidal Thoughts:  No  Memory:  Immediate;   Fair Recent;   Fair Remote;   Poor  Judgement:  Impaired  Insight:  Shallow  Psychomotor Activity:  Decreased  Concentration:  Concentration: Poor  Recall:  Poor  Fund of Knowledge:  Poor  Language:  Poor  Akathisia:  No  Handed:  Right  AIMS (if indicated):     Assets:  Desire for Improvement Resilience Social Support  ADL's:  Impaired  Cognition:  Impaired,  Mild  Sleep:  Number of Hours: 7.75     Treatment Plan Summary: Daily contact with patient to assess and evaluate symptoms and progress in treatment, Medication management and Plan Still not clear how much of his current presentation is direct mental illness and how much is the effect of medication weather too much or too little.  Mother was able to confirm for me that she thought that his condition worsened when he was taking Adderall.  I have agreed with her that we are stopping the Adderall for now.  I have been concerned in the past about whether too much or withdrawal from benzodiazepines is related to some of this.  Because he is so sedated I am going to continue cutting down on it by half thing the Valium dose again.  I ordered a chest x-ray which has not yet been done and we will follow-up with that.  I am considering ordering an MRI scan if it looks like he would be cooperative with the treatment but we can hold off on that for the moment.  Case reviewed with treatment team.  Continue monitoring and attempts to engage patient one-to-one and in groups.  [name], MD 03/24/2018, 1:23 PM

## 2018-03-24 NOTE — Progress Notes (Signed)
DAR Note 1:1 Pt A & O to self and place. Denies SI, HI, AVH and pain. However, pt observed responding to internal stimuli. Writer noted pt talking to his mother who was not in the room with him, picking up ants from the floor. Pt became irritable when redirection was prompted. Proceed to walking out of his room demanding d/c. Gait is very unsteady. Pt is confused, easily agitated when redirections attempted. 1:1 initiated for safety per order.  All medications given per provider's orders. Emotional support and encouragement offered to pt as needed. 1:1 precaution maintained with assigned staff in attendance at all time. No self harm gestures or falls to note at this time.  Pt continues to require redirections. POC continues for safety and mood stability.

## 2018-03-24 NOTE — Plan of Care (Signed)
°  Problem: Education: °Goal: Utilization of techniques to improve thought processes will improve °Outcome: Progressing °Goal: Knowledge of the prescribed therapeutic regimen will improve °Outcome: Progressing °  °Problem: Activity: °Goal: Interest or engagement in leisure activities will improve °Outcome: Progressing °Goal: Imbalance in normal sleep/wake cycle will improve °Outcome: Progressing °  °Problem: Coping: °Goal: Coping ability will improve °Outcome: Progressing °Goal: Will verbalize feelings °Outcome: Progressing °  °Problem: Health Behavior/Discharge Planning: °Goal: Ability to make decisions will improve °Outcome: Progressing °Goal: Compliance with therapeutic regimen will improve °Outcome: Progressing °  °Problem: Role Relationship: °Goal: Will demonstrate positive changes in social behaviors and relationships °Outcome: Progressing °  °Problem: Safety: °Goal: Ability to disclose and discuss suicidal ideas will improve °Outcome: Progressing °Goal: Ability to identify and utilize support systems that promote safety will improve °Outcome: Progressing °  °Problem: Self-Concept: °Goal: Will verbalize positive feelings about self °Outcome: Progressing °Goal: Level of anxiety will decrease °Outcome: Progressing °  °Problem: Education: °Goal: Knowledge of [place] [facility] Education information/materials will improve °Outcome: Progressing °Goal: Emotional status will improve °Outcome: Progressing °Goal: Mental status will improve °Outcome: Progressing °Goal: Verbalization of understanding the information provided will improve °Outcome: Progressing °  °Problem: Activity: °Goal: Interest or engagement in activities will improve °Outcome: Progressing °Goal: Sleeping patterns will improve °Outcome: Progressing °  °Problem: Coping: °Goal: Ability to verbalize frustrations and anger appropriately will improve °Outcome: Progressing °Goal: Ability to demonstrate self-control will improve °Outcome: Progressing °   °Problem: Health Behavior/Discharge Planning: °Goal: Identification of resources available to assist in meeting health care needs will improve °Outcome: Progressing °Goal: Compliance with treatment plan for underlying cause of condition will improve °Outcome: Progressing °  °Problem: Physical Regulation: °Goal: Ability to maintain clinical measurements within normal limits will improve °Outcome: Progressing °  °Problem: Safety: °Goal: Periods of time without injury will increase °Outcome: Progressing °  °

## 2018-03-24 NOTE — Progress Notes (Signed)
Patient is exhibiting symptoms of generalized weakness,fatique,  shuffling his feet  And looks sedated most of the evening, encouraged coping skills and safety is maintained, medication is taken with out any side effects.  Patient contract for safety of self and others, did not attend groups appetite is poor , mood and affects is blunt, denies any SI/HI/AVH,  Sleep is continuous, only  requires routine visual checks every 15 minutes for safety, support and education is provided no distress noted.

## 2018-03-24 NOTE — Progress Notes (Signed)
PT Cancellation Note  Patient Details Name: Jeffrey Lin MRN: [id number] DOB: 12-08-1975   Cancelled Treatment:    Reason Eval/Treat Not Completed: Fatigue/lethargy limiting ability to participate.  Pt found sleeping heavily upon entry.  Pt would not respond to verbal stimuli and with both tactile and verbal stimuli pt would only briefly open his eyes and fall immediately back to sleep without speaking.  Will attempt to see pt at a future date/time as appropriate.    Ovidio Hanger PT, DPT 03/24/18, 11:24 AM

## 2018-03-24 NOTE — Progress Notes (Signed)
Recreation Therapy Notes  Date: 03/24/2018  Time: 9:30 am   Location: Craft room   Behavioral response: N/A   Intervention Topic: Team work  Discussion/Intervention: Patient did not attend group.   Clinical Observations/Feedback:  Patient did not attend group.   Rosaleah Person LRT/CTRS        Dariella Gillihan 03/24/2018 10:53 AM

## 2018-03-24 NOTE — [facility] Group Notes (Signed)
Feelings Around Diagnosis 03/24/2018 1PM  Type of Therapy/Topic:  Group Therapy:  Feelings about Diagnosis  Participation Level:  Did Not Attend   Description of Group:   This group will allow patients to explore their thoughts and feelings about diagnoses they have received. Patients will be guided to explore their level of understanding and acceptance of these diagnoses. Facilitator will encourage patients to process their thoughts and feelings about the reactions of others to their diagnosis and will guide patients in identifying ways to discuss their diagnosis with significant others in their lives. This group will be process-oriented, with patients participating in exploration of their own experiences, giving and receiving support, and processing challenge from other group members.   Therapeutic Goals: 1. Patient will demonstrate understanding of diagnosis as evidenced by identifying two or more symptoms of the disorder 2. Patient will be able to express two feelings regarding the diagnosis 3. Patient will demonstrate their ability to communicate their needs through discussion and/or role play  Summary of Patient Progress:       Therapeutic Modalities:   Cognitive Behavioral Therapy Brief Therapy Feelings Identification    Suzan Slick, LCSW 03/24/2018 2:28 PM

## 2018-03-25 NOTE — Progress Notes (Signed)
Recreation Therapy Notes  Date: 03/25/2018  Time: 9:30 am  Location: Craft Room  Behavioral response: Appropriate  Intervention Topic: Coping Skills  Discussion/Intervention:  Group content on today was focused on coping skills. The group defined what coping skills are and when they can be used. Individuals described how they normally cope with thing and the coping skills they normally use. Patients expressed why it is important to cope with things and how not coping with things can affect you. The group participated in the intervention "Exploring coping skills" where they had a chance to test new coping skills they could use in the future.  Clinical Observations/Feedback:  Patient came to group and identified drawing, reading magazines and painting as healthy coping skills he participates in. He was focused on what peers and staff had to say about coping skills. Daruis Swaim LRT/CTRS         Richy Spradley 03/25/2018 11:32 AM

## 2018-03-25 NOTE — Plan of Care (Signed)
Pt. Is complaint with medications and unit procedures. Pt. Denies si/hi/avh, can contract for safety. Pt. Is monitored with 1:1 for safety.   Problem: Health Behavior/Discharge Planning: Goal: Compliance with therapeutic regimen will improve Outcome: Progressing   Problem: Health Behavior/Discharge Planning: Goal: Compliance with treatment plan for underlying cause of condition will improve Outcome: Progressing

## 2018-03-25 NOTE — [facility] Group Notes (Signed)
LCSW Group Therapy Note  03/25/2018 12:37 PM  Type of Therapy/Topic:  Group Therapy:  Emotion Regulation  Participation Level:  Did Not Attend   Description of Group:   The purpose of this group is to assist patients in learning to regulate negative emotions and experience positive emotions. Patients will be guided to discuss ways in which they have been vulnerable to their negative emotions. These vulnerabilities will be juxtaposed with experiences of positive emotions or situations, and patients will be challenged to use positive emotions to combat negative ones. Special emphasis will be placed on coping with negative emotions in conflict situations, and patients will process healthy conflict resolution skills.  Therapeutic Goals: 1. Patient will identify two positive emotions or experiences to reflect on in order to balance out negative emotions 2. Patient will label two or more emotions that they find the most difficult to experience 3. Patient will demonstrate positive conflict resolution skills through discussion and/or role plays  Summary of Patient Progress: x   Therapeutic Modalities:   Cognitive Behavioral Therapy Feelings Identification Dialectical Behavioral Therapy   Iris Pert, MSW, LCSW Clinical Social Work 03/25/2018 12:37 PM

## 2018-03-25 NOTE — Progress Notes (Signed)
Patient  Sleeps  through the night wit out any interruptions 1:1 is maintained no distress.

## 2018-03-25 NOTE — Progress Notes (Signed)
1:1 Observation Note  0700  0800 Patient observed in his room at this time with 1:1 present with no distress noted.  0900 Patient observed at this time in wheelchair with 1:1 present in the hallway near nursing station. No distress noted.  1000 Patient observed in the hallway near nursing station with 1:1 present for safety. No distress noted.  1100 Patient observed in the dayroom with 1:1 present for safety. No distress noted.  1200 Patient observed in his room resting at this time with 1:1 present for safety. No distress noted.  1300 Patient observed in his room resting at this time with 1:1 present for safety. No distress noted. 1400 Patient observed in his room resting at this time with 1:1 present for safety. No distress noted. 1500 Patient observed in his room resting at this time with 1:1 present for safety. No distress noted. 1600 Patient observed in his room resting at this time with 1:1 present for safety. No distress noted. 1700 Patient observed in his room resting at this time with 1:1 present for safety. No distress noted. 1800 Patient observed in his room resting at this time with 1:1 present for safety. No distress noted.

## 2018-03-25 NOTE — Progress Notes (Signed)
D: Pt. During assessments denies si/hi/avh, can contract for safety. Pt. Endorses a normal mood when asked. Pt. Speech is garbled still. Pt. Affect is still mostly flat and blunted. Pt. Thought process is disorganized often still.   A: Q x 1 hour observation checks were completed for safety. Patient was provided with education. Patient was given/offered medications per orders. Patient  was encourage to attend groups, participate in unit activities and continue with plan of care. Pt. Chart and plans of care reviewed. Pt. Given support and encouragement.   R: Patient is complaint with medications and unit procedures. Pt. Given shower today with staff aid and cloths changed. Pt. Given extensive high falls risk education. Pt. Seen by PT today. Pt. Spent a majority of the day isolative and withdrawn to his room resting. Pt. Is very lethargic throughout the day, MD aware of this.            Precautionary 1:1 for safety maintained, room free of safety hazards, patient sustains no injury or falls during this shift. Will endorse care to next shift.

## 2018-03-25 NOTE — Progress Notes (Signed)
[facility] MD Progress Note  03/25/2018 4:24 PM Jeffrey Lin  MRN:  [id number] Subjective: Follow-up for this patient with schizoaffective disorder with altered mental status of unclear etiology.  Patient remains very sleepy and withdrawn much of the day.  He did get up and participate with physical therapy.  He required a lot of assistance to walk even with a walker which apparently is quite different from how he was recently.  Patient is not able to give much lucid history because of how slurred his speech is and how slow his thinking is.  Later in the afternoon I came to try to wake him up and could barely arouse him. Principal Problem: Schizoaffective disorder (HCC) Diagnosis: Principal Problem:   Schizoaffective disorder (HCC) Active Problems:   Chronic back pain   Subacute delirium  Total Time spent with patient: 20 minutes  Past Psychiatric History: Patient has a history of schizoaffective disorder with medication management ECT all with only partial benefit.  Recent change in mental status of unclear etiology  Past Medical History:  Past Medical History:  Diagnosis Date  . Anxiety   . Chronic leg pain   . Depression   . Essential hypertension, benign 10/20/2015  . Schizoaffective disorder [facility])     Past Surgical History:  Procedure Laterality Date  . Block procedure at pain center  03/27/06  . Bone graft from hip to jaw  2004  . Bone graft right side of head to jaw  2006  . 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 Sphenopalatine ganglionic     Family History:  Family History  Problem Relation Age of Onset  . Hyperlipidemia Mother   . Hypertension Mother   . Depression Mother   . Anxiety disorder Mother   . Depression Brother   . Anxiety disorder Brother   . Bipolar disorder Maternal Grandfather   . Schizophrenia Maternal Grandfather   . Bipolar disorder Paternal Grandfather    Family Psychiatric  History: See previous Social History:  Social History   Substance  and Sexual Activity  Alcohol Use No  . Alcohol/week: 0.0 standard drinks     Social History   Substance and Sexual Activity  Drug Use No    Social History   Socioeconomic History  . Marital status: Single    Spouse name: Not on file  . Number of children: 0  . Years of education: 33  . Highest education level: Not on file  Occupational History  . Occupation: Marketing executive    Employer: [employer]  Social Needs  . Financial resource strain: Not on file  . Food insecurity:    Worry: Not on file    Inability: Not on file  . Transportation needs:    Medical: Not on file    Non-medical: Not on file  Tobacco Use  . Smoking status: Current Every Day Smoker    Packs/day: 1.00    Years: 23.00    Pack years: 23.00    Types: Cigarettes    Start date: 09/22/1995  . Smokeless tobacco: Former Engineer, water and Sexual Activity  . Alcohol use: No    Alcohol/week: 0.0 standard drinks  . Drug use: No  . Sexual activity: Not Currently  Lifestyle  . Physical activity:    Days per week: Not on file    Minutes per session: Not on file  . Stress: Not on file  Relationships  . Social connections:    Talks  on phone: Not on file    Gets together: Not on file    Attends religious service: Not on file    Active member of club or organization: Not on file    Attends meetings of clubs or organizations: Not on file    Relationship status: Not on file  Other Topics Concern  . Not on file  Social History Narrative      Exercise:  None, running causes head pain   Diet:  FF once daily, + fruits and veggies, + H2O, + Soda   Lives with mom in a one story home.  No children.  Currently not working.  Trying to get disability.  Education: college.    Additional Social History:                         Sleep: Fair  Appetite:  Fair  Current Medications: Current Facility-Administered Medications  Medication Dose Route Frequency Provider Last Rate Last Dose   . acetaminophen (TYLENOL) tablet 650 mg  650 mg Oral Q6H PRN Reggie Pile, MD      . alum & mag hydroxide-simeth (MAALOX/MYLANTA) 200-200-20 MG/5ML suspension 30 mL  30 mL Oral Q4H PRN Reggie Pile, MD      . atorvastatin (LIPITOR) tablet 40 mg  40 mg Oral Daily Reggie Pile, MD   40 mg at 03/25/18 0800  . diazepam (VALIUM) tablet 5 mg  5 mg Oral TID Clapacs, Jackquline Denmark, MD   5 mg at 03/25/18 1145  . FLUoxetine (PROZAC) capsule 40 mg  40 mg Oral Daily Reggie Pile, MD   40 mg at 03/25/18 0800  . furosemide (LASIX) tablet 20 mg  20 mg Oral Daily Reggie Pile, MD   20 mg at 03/25/18 0801  . gabapentin (NEURONTIN) capsule 800 mg  800 mg Oral TID Reggie Pile, MD   800 mg at 03/25/18 1145  . hydrOXYzine (ATARAX/VISTARIL) tablet 25 mg  25 mg Oral BID Reggie Pile, MD   25 mg at 03/25/18 0800  . losartan (COZAAR) tablet 100 mg  100 mg Oral Daily Reggie Pile, MD   100 mg at 03/25/18 0800  . magnesium hydroxide (MILK OF MAGNESIA) suspension 30 mL  30 mL Oral Daily PRN Reggie Pile, MD      . methocarbamol (ROBAXIN) tablet 750 mg  750 mg Oral Q12H PRN Reggie Pile, MD      . metoprolol succinate (TOPROL-XL) 24 hr tablet 50 mg  50 mg Oral Daily Reggie Pile, MD   50 mg at 03/25/18 0800  . OLANZapine (ZYPREXA) tablet 10 mg  10 mg Oral QHS Reggie Pile, MD   10 mg at 03/24/18 2107  . OLANZapine zydis (ZYPREXA) disintegrating tablet 5 mg  5 mg Oral Daily Reggie Pile, MD   5 mg at 03/25/18 0800  . pantoprazole (PROTONIX) EC tablet 40 mg  40 mg Oral Daily Reggie Pile, MD   40 mg at 03/25/18 0800  . traZODone (DESYREL) tablet 100 mg  100 mg Oral QHS PRN Reggie Pile, MD   100 mg at 03/24/18 2107  . Vitamin D (Ergocalciferol) (DRISDOL) capsule 50,000 Units  50,000 Units Oral Once per day on Mon Thu Joshi, Anand, MD   50,000 Units at 03/23/18 7356    Lab Results: No results found for this or any previous visit (from the past 48 hour(s)).  Blood Alcohol level:  Lab Results  Component Value Date   ETH <10  03/22/2018   ETH <  11 04/27/2012    Metabolic Disorder Labs: Lab Results  Component Value Date   HGBA1C 5.9 11/01/2015   No results found for: PROLACTIN Lab Results  Component Value Date   CHOL 191 03/18/2016   TRIG 363.0 (H) 03/18/2016   HDL 35.60 (L) 03/18/2016   CHOLHDL 5 03/18/2016   VLDL 72.6 (H) 03/18/2016   LDLCALC 84 11/01/2015   LDLCALC 102 (H) 09/28/2010    Physical Findings: AIMS: Facial and Oral Movements Muscles of Facial Expression: None, normal Lips and Perioral Area: None, normal Jaw: None, normal Tongue: None, normal,Extremity Movements Upper (arms, wrists, hands, fingers): None, normal Lower (legs, knees, ankles, toes): None, normal, Trunk Movements Neck, shoulders, hips: None, normal, Overall Severity Severity of abnormal movements (highest score from questions above): None, normal Incapacitation due to abnormal movements: None, normal Patient's awareness of abnormal movements (rate only patient's report): No Awareness, Dental Status Current problems with teeth and/or dentures?: No Does patient usually wear dentures?: No  CIWA:    COWS:     Musculoskeletal: Strength & Muscle Tone: decreased Gait & Station: unsteady Patient leans: N/A  Psychiatric Specialty Exam: Physical Exam  Nursing note and vitals reviewed. Constitutional: He appears well-developed and well-nourished.  HENT:  Head: Normocephalic and atraumatic.  Eyes: Pupils are equal, round, and reactive to light. Conjunctivae are normal.  Neck: Normal range of motion.  Cardiovascular: Regular rhythm and normal heart sounds.  Respiratory: Effort normal. No respiratory distress.  GI: Soft.  Musculoskeletal: Normal range of motion.  Neurological: He is alert. Coordination abnormal.  Skin: Skin is warm and dry.  Psychiatric: His affect is blunt. His speech is slurred. He is slowed. Cognition and memory are impaired. He expresses inappropriate judgment. He expresses no homicidal and no  suicidal ideation. He is noncommunicative.    Review of Systems  Constitutional: Negative.   HENT: Negative.   Eyes: Negative.   Respiratory: Negative.   Cardiovascular: Negative.   Gastrointestinal: Negative.   Musculoskeletal: Negative.   Skin: Negative.   Neurological: Positive for dizziness, focal weakness and weakness.  Psychiatric/Behavioral: Positive for depression.    Blood pressure 119/86, pulse 93, temperature 98.8 F (37.1 C), temperature source Oral, resp. rate 18, height  (1.93 m), weight 104.8 kg, SpO2 92 %.Body mass index is 28.12 kg/m.  General Appearance: Disheveled  Eye Contact:  Minimal  Speech:  Slow  Volume:  Decreased  Mood:  Dysphoric  Affect:  Congruent  Thought Process:  Disorganized  Orientation:  Full (Time, Place, and Person)  Thought Content:  Illogical and Rumination  Suicidal Thoughts:  No  Homicidal Thoughts:  No  Memory:  Negative  Judgement:  Negative  Insight:  Negative  Psychomotor Activity:  Shuffling Gait  Concentration:  Concentration: Poor  Recall:  Poor  Fund of Knowledge:  Poor  Language:  Poor  Akathisia:  No  Handed:  Right  AIMS (if indicated):     Assets:  Desire for Improvement Housing Social Support  ADL's:  Impaired  Cognition:  Impaired,  Moderate  Sleep:  Number of Hours: 7.5     Treatment Plan Summary: Daily contact with patient to assess and evaluate symptoms and progress in treatment, Medication management and Plan Truly unclear what is going on.  Remains with altered mental status of unclear etiology.  Consider getting an MRI today but it had been my understanding previously that the patient had a implanted hardware that would make this impossible.  We will have to research this further.  I  will reassess completely in the next day and look at his medication.  May ask neurology to get involved.  Unclear what if any labs would be most appropriate next.  [name], MD 03/25/2018, 4:24 PM

## 2018-03-25 NOTE — Progress Notes (Signed)
Patient is placed on 1:1 due to fall risk with poor gait and in compliance with fall  Precautions, tolerance is good no distress.

## 2018-03-25 NOTE — Progress Notes (Signed)
Patient is in bed with eyes closed restfully 1:1 is maintained for fall precautions, medication is taken before sleep noted.

## 2018-03-25 NOTE — Evaluation (Signed)
Physical Therapy Evaluation Patient Details Name: Jeffrey Lin MRN: [id number] DOB: 24-Mar-1975 Today's Date: 03/25/2018   History of Present Illness  From MD H&P: Pt is a 43 yo male with schizoaffective disorder and a history of chronic neurologic problems was brought to the emergency room by his mother because of altered mental status.  Mother reported that the patient had had altered mental status for several days appearing to be responding to internal stimuli and being confused.  On presentation to the emergency room he was not able to give lucid history to the doctor on call.  Patient denied having overdosed on anything.  Pt was a very poor historian.  He slurred his speech and could make out only a few words very slowly of what he said.  The patient himself denied having run out of anything or having any withdrawal.      Clinical Impression  Pt presents with deficits in strength, transfers, mobility, gait, balance, and activity tolerance.  Pt was Mod Ind with bed mobility tasks with extra time and effort required.  Pt required min A and several rocking attempts to stand from a low height surface.  In standing pt presented with posterior instability requiring min A to correct until he held a RW at which time pt was CGA with static standing.  Pt was able to amb 100' with a RW and +2 assist with w/c follow for safety.  Pt ambulated with a slow, cautious cadence with short B step length and occasional ataxic LE movements causing the need for Min A for stability, most notably during turns.  Pt will benefit from HHPT services upon discharge to safely address above deficits for decreased caregiver assistance and eventual return to PLOF.      Follow Up Recommendations Home health PT;Supervision for mobility/OOB    Equipment Recommendations  None recommended by PT    Recommendations for Other Services       Precautions / Restrictions Precautions Precautions: Fall Restrictions Weight Bearing  Restrictions: No      Mobility  Bed Mobility Overal bed mobility: Modified Independent             General bed mobility comments: Extra time and effort required but no physical assistance needed  Transfers Overall transfer level: Needs assistance Equipment used: Rolling walker (2 wheeled) Transfers: Sit to/from Stand Sit to Stand: +2 safety/equipment;Min assist         General transfer comment: Multiple attempts and min A needed to stand although from a low height surface  Ambulation/Gait Ambulation/Gait assistance: Min assist;+2 safety/equipment(w/c follow) Gait Distance (Feet): 100 Feet Assistive device: Rolling walker (2 wheeled) Gait Pattern/deviations: Step-through pattern;Ataxic;Decreased step length - right;Decreased step length - left;Trunk flexed Gait velocity: decreased   General Gait Details: Mod verbal cues for upright posture and amb closer to the RW with occasional mild ataxia and min A for stability   Stairs            Wheelchair Mobility    Modified Rankin (Stroke Patients Only)       Balance Overall balance assessment: Needs assistance Sitting-balance support: Feet supported;Bilateral upper extremity supported Sitting balance-Leahy Scale: Good     Standing balance support: Bilateral upper extremity supported;During functional activity Standing balance-Leahy Scale: Poor Standing balance comment: Occasional min A for stability in standing with a RW                             Pertinent  Vitals/Pain Pain Assessment: 0-10 Pain Score: 8  Pain Location: Low back, chronic per pt Pain Descriptors / Indicators: Sore;Aching Pain Intervention(s): Monitored during session    Home Living Family/patient expects to be discharged to:: Private residence(History from patient with reliability uncertain, no family available to confirm) Living Arrangements: Parent Available Help at Discharge: Family;Available 24 hours/day Type of Home:  House Home Access: Stairs to enter Entrance Stairs-Rails: Right;Left;Can reach both Entrance Stairs-Number of Steps: 4 Home Layout: One level Home Equipment: Walker - 2 wheels;Cane - single point      Prior Function Level of Independence: Independent with assistive device(s)         Comments: Mod Ind amb with a SPC limited community distances, no fall history, Ind with ADLs     Hand Dominance        Extremity/Trunk Assessment   Upper Extremity Assessment Upper Extremity Assessment: Generalized weakness    Lower Extremity Assessment Lower Extremity Assessment: Generalized weakness       Communication   Communication: Expressive difficulties  Cognition Arousal/Alertness: Lethargic Behavior During Therapy: Flat affect Overall Cognitive Status: No family/caregiver present to determine baseline cognitive functioning                                        General Comments      Exercises     Assessment/Plan    PT Assessment Patient needs continued PT services  PT Problem List Decreased strength;Decreased activity tolerance;Decreased balance;Decreased knowledge of use of DME       PT Treatment Interventions DME instruction;Gait training;Stair training;Functional mobility training;Therapeutic activities;Therapeutic exercise;Balance training;Patient/family education    PT Goals (Current goals can be found in the Care Plan section)  Acute Rehab PT Goals Patient Stated Goal: To get stronger PT Goal Formulation: With patient Time For Goal Achievement: 04/07/18 Potential to Achieve Goals: Good    Frequency Min 2X/week   Barriers to discharge        Co-evaluation               AM-PAC PT "6 Clicks" Mobility  Outcome Measure Help needed turning from your back to your side while in a flat bed without using bedrails?: None Help needed moving from lying on your back to sitting on the side of a flat bed without using bedrails?: None Help  needed moving to and from a bed to a chair (including a wheelchair)?: A Little Help needed standing up from a chair using your arms (e.g., wheelchair or bedside chair)?: A Little Help needed to walk in hospital room?: A Little Help needed climbing 3-5 steps with a railing? : A Lot 6 Click Score: 19    End of Session Equipment Utilized During Treatment: Gait belt Activity Tolerance: Patient tolerated treatment well Patient left: in bed;with call bell/phone within reach;with nursing/sitter in room(1:1 sitter in room) Nurse Communication: Mobility status PT Visit Diagnosis: Unsteadiness on feet (R26.81);Difficulty in walking, not elsewhere classified (R26.2);Muscle weakness (generalized) (M62.81)    Time: 7371-0626 PT Time Calculation (min) (ACUTE ONLY): 17 min   Charges:   PT Evaluation $PT Eval Low Complexity: 1 Low         D. Scott Saysha Menta PT, DPT 03/25/18, 1:56 PM

## 2018-03-26 ENCOUNTER — Encounter: Payer: Self-pay | Admitting: Family Medicine

## 2018-03-26 ENCOUNTER — Other Ambulatory Visit: Payer: Self-pay | Admitting: Family Medicine

## 2018-03-26 NOTE — Progress Notes (Signed)
Patient continues to be on 1:1 no distress noted, resting in bed with eyes closed, will continue  to monitor

## 2018-03-26 NOTE — Progress Notes (Signed)
Recreation Therapy Notes  Date: 03/26/2018  Time: 9:30 am  Location: Craft Room  Behavioral response: Appropriate  Intervention Topic: Relaxation   Discussion/Intervention:  Group content today was focused on relaxation. The group defined relaxation and identified healthy ways to relax. Individuals expressed how much time they spend relaxing. Patients expressed how much their life would be if they did not make time for themselves to relax. The group stated ways they could improve their relaxation techniques in the future.  Individuals participated in the intervention "Time to Relax" where they had a chance to experience different relaxation techniques.  Clinical Observations/Feedback:  Patient came to group late due to unknown reasons. He participated in the intervention during group but left early and never returned.  Nefertiti Mohamad LRT/CTRS         Farris Geiman 03/26/2018 11:04 AM

## 2018-03-26 NOTE — Plan of Care (Signed)
  Problem: Education: Goal: Utilization of techniques to improve thought processes will improve Outcome: Progressing  Patient has improved thought process.

## 2018-03-26 NOTE — Progress Notes (Signed)
1:1 Observation Note  0700 Patient observed in his room at this time resting with 1:1 present for safety. No distress noted.  0800 Patient observed in the day room eating breakfast at this time with 1:1 present for safety. No distress noted. 0900 Patient observed in group with 1:1 present for safety. No distress noted.  1000 Patient observed in group with 1:1 present for safety. No distress noted.  1100 Patient observed in the bathroom at this time with 1:1 present for safety. No distress noted.  1200 Patient observed in his room at this time resting with 1:1 present for safety. No distress noted.  1300 Patient observed in his room at this time resting with 1:1 present for safety. No distress noted.  1400 Patient observed in his room at this time resting with 1:1 present for safety. No distress noted.  1500 Patient observed in his room at this time resting with 1:1 present for safety. No distress noted.

## 2018-03-26 NOTE — Plan of Care (Signed)
Pt. Participation with unit activities and groups is improved. Pt. Complaint with medications. Pt. Denies si/hi/avh, can contract for safety. Pt. Is less lethargic this morning, able to participate better with this Clinical research associate. Pt. Monitored for safety with 1:1 safety sitter.    Problem: Activity: Goal: Interest or engagement in leisure activities will improve Outcome: Progressing   Problem: Health Behavior/Discharge Planning: Goal: Compliance with therapeutic regimen will improve Outcome: Progressing   Problem: Safety: Goal: Ability to disclose and discuss suicidal ideas will improve Outcome: Progressing   Problem: Education: Goal: Mental status will improve Outcome: Progressing   Problem: Safety: Goal: Periods of time without injury will increase Outcome: Progressing

## 2018-03-26 NOTE — Plan of Care (Signed)
  Problem: Education: Goal: Knowledge of the prescribed therapeutic regimen will improve Outcome: Progressing  Patient is complaint with prescribed therapeutic regimen.  

## 2018-03-26 NOTE — Progress Notes (Signed)
[facility] MD Progress Note  03/26/2018 4:07 PM Jeffrey Lin  MRN:  [id number] Subjective: Follow-up for this patient with a history of schizoaffective disorder who was in the hospital with something that seemed almost more like a delirium when he presented.  The full understanding of what is going on with him has been hard to come by.  Patient has a history of being very sensitive to medications and to withdrawal from medicines.  Today he seems slightly better.  He was awake a fairly good part of the day.  I spoke with him and I was able to understand most of what he said.  He denied feeling depressed.  He denied having hallucinations.  He still was a little confused as to his situation but did not make any bizarre comments.  Certainly has not been aggressive and largely has been cooperative on the unit.  Vitals remain unremarkable.  No new findings on lab test. Principal Problem: Schizoaffective disorder (HCC) Diagnosis: Principal Problem:   Schizoaffective disorder (HCC) Active Problems:   Chronic back pain   Subacute delirium  Total Time spent with patient: 30 minutes  Past Psychiatric History: Patient has a history of schizoaffective disorder with recent decline also in cognitive function.  History of withdrawal problems coming off of benzodiazepines.  History of suicidality in the past.  Past Medical History:  Past Medical History:  Diagnosis Date  . Anxiety   . Chronic leg pain   . Depression   . Essential hypertension, benign 10/20/2015  . Schizoaffective disorder [facility])     Past Surgical History:  Procedure Laterality Date  . Block procedure at pain center  03/27/06  . Bone graft from hip to jaw  2004  . Bone graft right side of head to jaw  2006  . 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 Sphenopalatine ganglionic     Family History:  Family History  Problem Relation Age of Onset  . Hyperlipidemia Mother   . Hypertension Mother   . Depression Mother   . Anxiety disorder  Mother   . Depression Brother   . Anxiety disorder Brother   . Bipolar disorder Maternal Grandfather   . Schizophrenia Maternal Grandfather   . Bipolar disorder Paternal Grandfather    Family Psychiatric  History: See previous Social History:  Social History   Substance and Sexual Activity  Alcohol Use No  . Alcohol/week: 0.0 standard drinks     Social History   Substance and Sexual Activity  Drug Use No    Social History   Socioeconomic History  . Marital status: Single    Spouse name: Not on file  . Number of children: 0  . Years of education: 46  . Highest education level: Not on file  Occupational History  . Occupation: Marketing executive    Employer: [employer]  Social Needs  . Financial resource strain: Not on file  . Food insecurity:    Worry: Not on file    Inability: Not on file  . Transportation needs:    Medical: Not on file    Non-medical: Not on file  Tobacco Use  . Smoking status: Current Every Day Smoker    Packs/day: 1.00    Years: 23.00    Pack years: 23.00    Types: Cigarettes    Start date: 09/22/1995  . Smokeless tobacco: Former Engineer, water and Sexual Activity  . Alcohol use: No    Alcohol/week: 0.0 standard  drinks  . Drug use: No  . Sexual activity: Not Currently  Lifestyle  . Physical activity:    Days per week: Not on file    Minutes per session: Not on file  . Stress: Not on file  Relationships  . Social connections:    Talks on phone: Not on file    Gets together: Not on file    Attends religious service: Not on file    Active member of club or organization: Not on file    Attends meetings of clubs or organizations: Not on file    Relationship status: Not on file  Other Topics Concern  . Not on file  Social History Narrative      Exercise:  None, running causes head pain   Diet:  FF once daily, + fruits and veggies, + H2O, + Soda   Lives with mom in a one story home.  No children.  Currently not  working.  Trying to get disability.  Education: college.    Additional Social History:                         Sleep: Fair  Appetite:  Fair  Current Medications: Current Facility-Administered Medications  Medication Dose Route Frequency Provider Last Rate Last Dose  . acetaminophen (TYLENOL) tablet 650 mg  650 mg Oral Q6H PRN Reggie Pile, MD      . alum & mag hydroxide-simeth (MAALOX/MYLANTA) 200-200-20 MG/5ML suspension 30 mL  30 mL Oral Q4H PRN Reggie Pile, MD      . atorvastatin (LIPITOR) tablet 40 mg  40 mg Oral Daily Reggie Pile, MD   40 mg at 03/26/18 0729  . diazepam (VALIUM) tablet 5 mg  5 mg Oral TID Oree Mirelez, Jackquline Denmark, MD   5 mg at 03/26/18 1142  . FLUoxetine (PROZAC) capsule 40 mg  40 mg Oral Daily Reggie Pile, MD   40 mg at 03/26/18 4098  . furosemide (LASIX) tablet 20 mg  20 mg Oral Daily Reggie Pile, MD   20 mg at 03/26/18 0729  . gabapentin (NEURONTIN) capsule 800 mg  800 mg Oral TID Reggie Pile, MD   800 mg at 03/26/18 1142  . hydrOXYzine (ATARAX/VISTARIL) tablet 25 mg  25 mg Oral BID Reggie Pile, MD   25 mg at 03/26/18 1191  . losartan (COZAAR) tablet 100 mg  100 mg Oral Daily Reggie Pile, MD   100 mg at 03/26/18 4782  . magnesium hydroxide (MILK OF MAGNESIA) suspension 30 mL  30 mL Oral Daily PRN Reggie Pile, MD      . methocarbamol (ROBAXIN) tablet 750 mg  750 mg Oral Q12H PRN Reggie Pile, MD      . metoprolol succinate (TOPROL-XL) 24 hr tablet 50 mg  50 mg Oral Daily Reggie Pile, MD   50 mg at 03/26/18 9562  . OLANZapine (ZYPREXA) tablet 10 mg  10 mg Oral QHS Reggie Pile, MD   10 mg at 03/25/18 2153  . OLANZapine zydis (ZYPREXA) disintegrating tablet 5 mg  5 mg Oral Daily Reggie Pile, MD   5 mg at 03/26/18 0729  . pantoprazole (PROTONIX) EC tablet 40 mg  40 mg Oral Daily Reggie Pile, MD   40 mg at 03/26/18 1308  . traZODone (DESYREL) tablet 100 mg  100 mg Oral QHS PRN Reggie Pile, MD   100 mg at 03/25/18 2153  . Vitamin D (Ergocalciferol)  (DRISDOL) capsule 50,000 Units  50,000 Units Oral Once per  day on Mon Thu Joshi, Anand, MD   50,000 Units at 03/26/18 0454    Lab Results: No results found for this or any previous visit (from the past 48 hour(s)).  Blood Alcohol level:  Lab Results  Component Value Date   [facility] <10 03/22/2018   ETH <11 04/27/2012    Metabolic Disorder Labs: Lab Results  Component Value Date   HGBA1C 5.9 11/01/2015   No results found for: PROLACTIN Lab Results  Component Value Date   CHOL 191 03/18/2016   TRIG 363.0 (H) 03/18/2016   HDL 35.60 (L) 03/18/2016   CHOLHDL 5 03/18/2016   VLDL 72.6 (H) 03/18/2016   LDLCALC 84 11/01/2015   LDLCALC 102 (H) 09/28/2010    Physical Findings: AIMS: Facial and Oral Movements Muscles of Facial Expression: None, normal Lips and Perioral Area: None, normal Jaw: None, normal Tongue: None, normal,Extremity Movements Upper (arms, wrists, hands, fingers): None, normal Lower (legs, knees, ankles, toes): None, normal, Trunk Movements Neck, shoulders, hips: None, normal, Overall Severity Severity of abnormal movements (highest score from questions above): None, normal Incapacitation due to abnormal movements: None, normal Patient's awareness of abnormal movements (rate only patient's report): No Awareness, Dental Status Current problems with teeth and/or dentures?: No Does patient usually wear dentures?: No  CIWA:    COWS:     Musculoskeletal: Strength & Muscle Tone: decreased Gait & Station: unsteady Patient leans: N/A  Psychiatric Specialty Exam: Physical Exam  Nursing note and vitals reviewed. Constitutional: He appears well-developed and well-nourished.  HENT:  Head: Normocephalic and atraumatic.  Eyes: Pupils are equal, round, and reactive to light. Conjunctivae are normal.  Neck: Normal range of motion.  Cardiovascular: Regular rhythm and normal heart sounds.  Respiratory: Effort normal. No respiratory distress.  GI: Soft.  Musculoskeletal:  Normal range of motion.  Neurological: He is alert.  Skin: Skin is warm and dry.  Psychiatric: Judgment normal. His affect is blunt. His speech is delayed and tangential. He is slowed. He is not agitated and not aggressive. Thought content is not paranoid. Cognition and memory are impaired. He expresses no homicidal and no suicidal ideation.    Review of Systems  Constitutional: Negative.   HENT: Negative.   Eyes: Negative.   Respiratory: Negative.   Cardiovascular: Negative.   Gastrointestinal: Negative.   Musculoskeletal: Negative.   Skin: Negative.   Neurological: Positive for focal weakness.  Psychiatric/Behavioral: Positive for memory loss. Negative for depression, hallucinations, substance abuse and suicidal ideas. The patient is not nervous/anxious and does not have insomnia.     Blood pressure 103/72, pulse 82, temperature 97.8 F (36.6 C), resp. rate 20, height  (1.93 m), weight 104.8 kg, SpO2 98 %.Body mass index is 28.12 kg/m.  General Appearance: Disheveled  Eye Contact:  Minimal  Speech:  Garbled, Slow and Slurred  Volume:  Decreased  Mood:  Euthymic  Affect:  Constricted  Thought Process:  Coherent  Orientation:  Negative  Thought Content:  Rumination and Tangential  Suicidal Thoughts:  No  Homicidal Thoughts:  No  Memory:  Immediate;   Fair Recent;   Fair Remote;   Fair  Judgement:  Fair  Insight:  Fair  Psychomotor Activity:  Decreased  Concentration:  Concentration: Poor  Recall:  [employer] of Knowledge:  Fair  Language:  Fair  Akathisia:  No  Handed:  Right  AIMS (if indicated):     Assets:  Desire for Improvement Housing Social Support  ADL's:  Impaired  Cognition:  Impaired,  Mild and Moderate  Sleep:  Number of Hours: 8.5     Treatment Plan Summary: Daily contact with patient to assess and evaluate symptoms and progress in treatment, Medication management and Plan Reviewed lab reports reviewed treatment plans.  Spoke with treatment  team.  I had considered requesting an MRI but the patient has a prohibitive amount of implanted metal hardware in his jaw.  Overall he seems to be getting slightly better.  He was certainly more alert and interactive today than he was yesterday.  He himself has no specific complaints.  For now I think I will not change his medicine.  I am satisfied with the decreased dose of diazepam in his current antidepressant and antipsychotic medicine.  I called his mother today and left a voicemail message letting her know that I wanted her thoughts about how he was doing and to check-in.  Continue to think the length of stay is probably on the order of 3 or 4 days.  [name], MD 03/26/2018, 4:07 PM

## 2018-03-26 NOTE — Plan of Care (Signed)
  Problem: Education: Goal: Knowledge of the prescribed therapeutic regimen will improve Outcome: Progressing  Patient is complaint with prescribed medication regimen.

## 2018-03-26 NOTE — Progress Notes (Signed)
Patient continues to be on 1:1 sitter at bedside, resting quietly in bed no distress noted, 15 minutes safety checks maintained will continue to monitor.

## 2018-03-26 NOTE — Progress Notes (Signed)
Patient continues to be on 1:1 no distress noted, respiration even non labored ,laying in bed with eyes closed, 15 minutes safety checks maintained will continue to monitor.Jeffrey Lin

## 2018-03-26 NOTE — Progress Notes (Signed)
D:Pt. During assessments denies si/hi/avh, can contract for safety. Pt. Is discharge fixated and states, "I'm ready to go home already". Pt. Describes a dysphoric mood, but is mostly pleasant. Pt. Speech continues to be garbled, soft, and slow. Pt. Thought process continues to be frequently disorganized. Pt. Denies anxiety and or depression.    A: Q x 1 hour observation checks were completed for safety. Patient was provided with education.Patient was given/offered medications per orders. Patient was encourage to attend groups, participate in unit activities and continue with plan of care. Pt. Chart and plans of care reviewed. Pt. Given support and encouragement.  R:Patient is complaint with medications and unit procedures with direction and encouragement. Pt. Given extensive high falls risk education. Pt. Is less lethargic today overall, able to participate more and is less isolative and withdrawn to room overall.            Precautionary 1:1 for safety maintained, room free of safety hazards, patient sustains no injury or falls during this shift. Will endorse care to next shift.

## 2018-03-26 NOTE — Progress Notes (Addendum)
Patient is alert and oriented x 3, his thoughts are organized, speech is garbled, affect is flat but brightens during conversation, he denies SI/HI/AVH and pain. Patient is on 1:1 at bedside per safety precaution, he is noted to be withdrawn, isolates to self even when encouraged to come out of his room, he was sleepy most of the shift. Patient was offered emotional support and he was receptive. 15 minutes safety checks maintained will continue to monitor

## 2018-03-26 NOTE — [facility] Group Notes (Signed)
LCSW Group Therapy Note  03/26/2018 1:00 PM  Type of Therapy/Topic:  Group Therapy:  Balance in Life  Participation Level:  Did Not Attend  Description of Group:    This group will address the concept of balance and how it feels and looks when one is unbalanced. Patients will be encouraged to process areas in their lives that are out of balance and identify reasons for remaining unbalanced. Facilitators will guide patients in utilizing problem-solving interventions to address and correct the stressor making their life unbalanced. Understanding and applying boundaries will be explored and addressed for obtaining and maintaining a balanced life. Patients will be encouraged to explore ways to assertively make their unbalanced needs known to significant others in their lives, using other group members and facilitator for support and feedback.  Therapeutic Goals: 1. Patient will identify two or more emotions or situations they have that consume much of in their lives. 2. Patient will identify signs/triggers that life has become out of balance:  3. Patient will identify two ways to set boundaries in order to achieve balance in their lives:  4. Patient will demonstrate ability to communicate their needs through discussion and/or role plays  Summary of Patient Progress:  X    Therapeutic Modalities:   Cognitive Behavioral Therapy Solution-Focused Therapy Assertiveness Training  Penni Homans MSW, LCSW 03/26/2018 2:30 PM

## 2018-03-26 NOTE — Telephone Encounter (Signed)
Last office visit 02/03/2018 difficulty urinating.  Last refilled 02/02/2018 for #60 with 1 refill.  Currently admitted in hospital.  No future appointments with PCP.  Refill?

## 2018-03-27 ENCOUNTER — Ambulatory Visit: Payer: Self-pay

## 2018-03-27 MED ORDER — AMOXICILLIN 500 MG PO CAPS
500.0000 mg | ORAL_CAPSULE | Freq: Three times a day (TID) | ORAL | Status: DC
  Administered 2018-03-27 – 2018-03-28 (×2): 500 mg via ORAL
  Filled 2018-03-27 (×2): qty 1

## 2018-03-27 NOTE — Progress Notes (Addendum)
Patient is alert and oriented x 4, denies SI/HI/AVH, sitting in room comfortably, denies pain, complaint with medication regimen. Patients thoughts are organized, speech is garbled. 15 minutes safety checks maintained will continue to monitor.

## 2018-03-27 NOTE — Progress Notes (Signed)
1:1 Patient Rounding Note:  0700: Patient is in his room asleep with his assigned safety sitter present.  1100: Patient is in his room changing clothes with his assigned safety sitter present.  1500: Patient is asleep in his room, with his assigned safety sitter present at bedside.  1900: Patient is being wheeled back to his room from the community room after visiting with his mother and brother. Patient's assigned safety sitter present.

## 2018-03-27 NOTE — Tx Team (Signed)
Interdisciplinary Treatment and Diagnostic Plan Update  03/27/2018 Time of Session: 830am Jeffrey Lin MRN: [id number]  Principal Diagnosis: Schizoaffective disorder [facility](HCC)  Secondary Diagnoses: Principal Problem:   Schizoaffective disorder (HCC) Active Problems:   Chronic back pain   Subacute delirium   Current Medications:  Current Facility-Administered Medications  Medication Dose Route Frequency Provider Last Rate Last Dose  . acetaminophen (TYLENOL) tablet 650 mg  650 mg Oral Q6H PRN Reggie PileJoshi, Anand, MD      . alum & mag hydroxide-simeth (MAALOX/MYLANTA) 200-200-20 MG/5ML suspension 30 mL  30 mL Oral Q4H PRN Reggie PileJoshi, Anand, MD      . atorvastatin (LIPITOR) tablet 40 mg  40 mg Oral Daily Reggie PileJoshi, Anand, MD   40 mg at 03/27/18 0910  . diazepam (VALIUM) tablet 5 mg  5 mg Oral TID Clapacs, Jackquline DenmarkJohn T, MD   5 mg at 03/27/18 1207  . FLUoxetine (PROZAC) capsule 40 mg  40 mg Oral Daily Reggie PileJoshi, Anand, MD   40 mg at 03/27/18 [id number]  . furosemide (LASIX) tablet 20 mg  20 mg Oral Daily Reggie PileJoshi, Anand, MD   20 mg at 03/27/18 0910  . gabapentin (NEURONTIN) capsule 800 mg  800 mg Oral TID Reggie PileJoshi, Anand, MD   800 mg at 03/27/18 1207  . hydrOXYzine (ATARAX/VISTARIL) tablet 25 mg  25 mg Oral BID Reggie PileJoshi, Anand, MD   25 mg at 03/27/18 0911  . losartan (COZAAR) tablet 100 mg  100 mg Oral Daily Reggie PileJoshi, Anand, MD   100 mg at 03/27/18 0911  . magnesium hydroxide (MILK OF MAGNESIA) suspension 30 mL  30 mL Oral Daily PRN Reggie PileJoshi, Anand, MD      . methocarbamol (ROBAXIN) tablet 750 mg  750 mg Oral Q12H PRN Reggie PileJoshi, Anand, MD      . metoprolol succinate (TOPROL-XL) 24 hr tablet 50 mg  50 mg Oral Daily Reggie PileJoshi, Anand, MD   50 mg at 03/26/18 [id number]  . OLANZapine (ZYPREXA) tablet 10 mg  10 mg Oral QHS Reggie PileJoshi, Anand, MD   10 mg at 03/26/18 2142  . OLANZapine zydis (ZYPREXA) disintegrating tablet 5 mg  5 mg Oral Daily Reggie PileJoshi, Anand, MD   5 mg at 03/27/18 0910  . pantoprazole (PROTONIX) EC tablet 40 mg  40 mg Oral Daily Reggie PileJoshi, Anand, MD   40  mg at 03/27/18 0910  . traZODone (DESYREL) tablet 100 mg  100 mg Oral QHS PRN Reggie PileJoshi, Anand, MD   100 mg at 03/26/18 2141  . Vitamin D (Ergocalciferol) (DRISDOL) capsule 50,000 Units  50,000 Units Oral Once per day on Mon Thu Joshi, Anand, MD   50,000 Units at 03/26/18 0802   PTA Medications: Medications Prior to Admission  Medication Sig Dispense Refill Last Dose  . amantadine (SYMMETREL) 100 MG capsule Take 100 mg by mouth 2 (two) times daily.  5 03/21/2018 at 2000  . amphetamine-dextroamphetamine (ADDERALL) 20 MG tablet Take 20 mg by mouth 3 (three) times daily.  0 03/21/2018 at 2000  . atorvastatin (LIPITOR) 40 MG tablet TAKE 1 TABLET BY MOUTH EVERY DAY 90 tablet 3 03/21/2018 at 1100  . benztropine (COGENTIN) 1 MG tablet Take 1 mg by mouth at bedtime.    03/21/2018 at 2000  . diazepam (VALIUM) 10 MG tablet Take 10 mg by mouth 4 (four) times daily.    03/21/2018 at 2000  . FLUoxetine (PROZAC) 40 MG capsule Take 40 mg by mouth daily.  12 03/21/2018 at 1100  . furosemide (LASIX) 20 MG tablet TAKE 1 TABLET BY  MOUTH EVERY DAY 90 tablet 1 03/21/2018 at 1100  . gabapentin (NEURONTIN) 800 MG tablet Take 800 mg three x a day. 90 tablet 5 03/21/2018 at 2000  . hydrOXYzine (VISTARIL) 25 MG capsule TAKE 1 CAPSULE BY MOUTH 4 TIMES A DAY  5 03/21/2018 at 2000  . losartan (COZAAR) 100 MG tablet Take 1 tablet (100 mg total) by mouth daily. Please schedule physical 30 tablet 0 03/21/2018 at 1100  . methocarbamol (ROBAXIN) 750 MG tablet TAKE 1 TABLET (750 MG TOTAL) BY MOUTH EVERY 12 (TWELVE) HOURS AS NEEDED FOR MUSCLE SPASMS. 60 tablet 1 prn at prn  . metoprolol succinate (TOPROL-XL) 50 MG 24 hr tablet Take 1 tablet (50 mg total) by mouth daily. Take with or immediately following a meal. 30 tablet 11 03/21/2018 at 1100  . OLANZapine (ZYPREXA) 10 MG tablet TAKE 1 TABLET BY MOUTH AT BEDTIME (REPLACES SYMBYAX)  3 03/21/2018 at 2000  . omeprazole (PRILOSEC) 20 MG capsule Take 20 mg by mouth daily as needed.   prn at prn  .  potassium chloride (K-DUR) 10 MEQ tablet TAKE 1 TABLET BY MOUTH EVERY DAY 90 tablet 1 03/21/2018 at 1100  . Vitamin D, Ergocalciferol, (DRISDOL) 50000 units CAPS capsule Take 1 capsule (50,000 Units total) by mouth 2 (two) times a week.   Past Week at Unknown time    Patient Stressors: Educational concerns Health problems  Patient Strengths: Active sense of humor Supportive family/friends  Treatment Modalities: Medication Management, Group therapy, Case management,  1 to 1 session with clinician, Psychoeducation, Recreational therapy.   Physician Treatment Plan for Primary Diagnosis: Schizoaffective disorder (HCC) Long Term Goal(s): Improvement in symptoms so as ready for discharge Improvement in symptoms so as ready for discharge   Short Term Goals: Ability to verbalize feelings will improve Ability to demonstrate self-control will improve Ability to identify and develop effective coping behaviors will improve Ability to maintain clinical measurements within normal limits will improve Compliance with prescribed medications will improve  Medication Management: Evaluate patient's response, side effects, and tolerance of medication regimen.  Therapeutic Interventions: 1 to 1 sessions, Unit Group sessions and Medication administration.  Evaluation of Outcomes: Progressing  Physician Treatment Plan for Secondary Diagnosis: Principal Problem:   Schizoaffective disorder (HCC) Active Problems:   Chronic back pain   Subacute delirium  Long Term Goal(s): Improvement in symptoms so as ready for discharge Improvement in symptoms so as ready for discharge   Short Term Goals: Ability to verbalize feelings will improve Ability to demonstrate self-control will improve Ability to identify and develop effective coping behaviors will improve Ability to maintain clinical measurements within normal limits will improve Compliance with prescribed medications will improve     Medication  Management: Evaluate patient's response, side effects, and tolerance of medication regimen.  Therapeutic Interventions: 1 to 1 sessions, Unit Group sessions and Medication administration.  Evaluation of Outcomes: Progressing   RN Treatment Plan for Primary Diagnosis: Schizoaffective disorder (HCC) Long Term Goal(s): Knowledge of disease and therapeutic regimen to maintain health will improve  Short Term Goals: Ability to verbalize frustration and anger appropriately will improve, Ability to demonstrate self-control, Ability to participate in decision making will improve, Ability to verbalize feelings will improve, Ability to identify and develop effective coping behaviors will improve and Compliance with prescribed medications will improve  Medication Management: RN will administer medications as ordered by provider, will assess and evaluate patient's response and provide education to patient for prescribed medication. RN will report any adverse and/or side effects  to prescribing provider.  Therapeutic Interventions: 1 on 1 counseling sessions, Psychoeducation, Medication administration, Evaluate responses to treatment, Monitor vital signs and CBGs as ordered, Perform/monitor CIWA, COWS, AIMS and Fall Risk screenings as ordered, Perform wound care treatments as ordered.  Evaluation of Outcomes: Progressing   LCSW Treatment Plan for Primary Diagnosis: Schizoaffective disorder (HCC) Long Term Goal(s): Safe transition to appropriate next level of care at discharge, Engage patient in therapeutic group addressing interpersonal concerns.  Short Term Goals: Engage patient in aftercare planning with referrals and resources  Therapeutic Interventions: Assess for all discharge needs, 1 to 1 time with Social worker, Explore available resources and support systems, Assess for adequacy in community support network, Educate family and significant other(s) on suicide prevention, Complete Psychosocial  Assessment, Interpersonal group therapy.  Evaluation of Outcomes: Progressing   Progress in Treatment: Attending groups: No. Participating in groups: No. Taking medication as prescribed: Yes. Toleration medication: Yes. Family/Significant other contact made: Yes, individual(s) contacted:  Phillips Hay, mother Patient understands diagnosis: No. Discussing patient identified problems/goals with staff: Yes. Medical problems stabilized or resolved: No. Denies suicidal/homicidal ideation: Yes. Issues/concerns per patient self-inventory: No. Other: NA  New problem(s) identified: No, Describe:  none reported  New Short Term/Long Term Goal(s): "Get off everything except gabbapentin"  Patient Goals:  "Get off everything except gabbapentin"  Discharge Plan or Barriers: Pt will return home and follow up with outpatient treatment  Reason for Continuation of Hospitalization: Medication stabilization  Estimated Length of Stay: 5-7 days  Recreational Therapy: Patient Stressors: N/A Patient Goal: Patient will engage in groups without prompting or encouragement from LRT x3 group sessions within 5 recreation therapy group sessions  Attendees: Patient:  03/27/2018 1:38 PM  Physician: [name] 03/27/2018 1:38 PM  Nursing: Faylene Million 03/27/2018 1:38 PM  RN Care Manager: 03/27/2018 1:38 PM  Social Worker: Carliss Porcaro Kalman Jewels Moton  [name] So  03/27/2018 1:38 PM  Recreational Therapist:  03/27/2018 1:38 PM  Other:  03/27/2018 1:38 PM  Other:  03/27/2018 1:38 PM  Other: 03/27/2018 1:38 PM    Scribe for Treatment Team: Suzan Slick, LCSW 03/27/2018 1:38 PM

## 2018-03-27 NOTE — Progress Notes (Signed)
This Clinical research associate held patient's blood pressure medication because his BP was 113/72 and he is still unsteady on his feet. This Clinical research associate notified MD.

## 2018-03-27 NOTE — Progress Notes (Addendum)
This Clinical research associate held patient's Valium because he has been drowsy throughout the day. This Clinical research associate notified MD.

## 2018-03-27 NOTE — Progress Notes (Signed)
[facility] MD Progress Note  03/27/2018 8:17 PM Jeffrey Lin  MRN:  [id number] Subjective: Follow-up for this patient with multiple medical problems and history of schizoaffective disorder.  Patient seen and I spoke with the patient and his family.  Patient reports he is feeling better.  He still has made reports at times of seeing things but less frequently.  He is more awake alert and interactive today than he has been.  Not agitated or aggressive.  Mother says he is not yet back to his baseline but is getting closer.  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 Principal Problem: Schizoaffective disorder (HCC) Diagnosis: Principal Problem:   Schizoaffective disorder (HCC) Active Problems:   Chronic back pain   Subacute delirium  Total Time spent with patient: 30 minutes  Past Psychiatric History: Patient has a history of schizoaffective disorder also cognitive problems and neurologic illness that has been progressive.  Past Medical History:  Past Medical History:  Diagnosis Date  . Anxiety   . Chronic leg pain   . Depression   . Essential hypertension, benign 10/20/2015  . Schizoaffective disorder [facility])     Past Surgical History:  Procedure Laterality Date  . Block procedure at pain center  03/27/06  . Bone graft from hip to jaw  2004  . Bone graft right side of head to jaw  2006  . 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 Sphenopalatine ganglionic     Family History:  Family History  Problem Relation Age of Onset  . Hyperlipidemia Mother   . Hypertension Mother   . Depression Mother   . Anxiety disorder Mother   . Depression Brother   . Anxiety disorder Brother   . Bipolar disorder Maternal Grandfather   . Schizophrenia Maternal Grandfather   . Bipolar disorder Paternal Grandfather    Family Psychiatric  History: See previous Social History:  Social History   Substance and Sexual Activity  Alcohol Use No  . Alcohol/week:  0.0 standard drinks     Social History   Substance and Sexual Activity  Drug Use No    Social History   Socioeconomic History  . Marital status: Single    Spouse name: Not on file  . Number of children: 0  . Years of education: 66  . Highest education level: Not on file  Occupational History  . Occupation: Marketing executive    Employer: [employer]  Social Needs  . Financial resource strain: Not on file  . Food insecurity:    Worry: Not on file    Inability: Not on file  . Transportation needs:    Medical: Not on file    Non-medical: Not on file  Tobacco Use  . Smoking status: Current Every Day Smoker    Packs/day: 1.00    Years: 23.00    Pack years: 23.00    Types: Cigarettes    Start date: 09/22/1995  . Smokeless tobacco: Former Engineer, water and Sexual Activity  . Alcohol use: No    Alcohol/week: 0.0 standard drinks  . Drug use: No  . Sexual activity: Not Currently  Lifestyle  . Physical activity:    Days per week: Not on file    Minutes per session: Not on file  . Stress: Not on file  Relationships  . Social connections:    Talks on phone: Not on file    Gets together: Not on file  Attends religious service: Not on file    Active member of club or organization: Not on file    Attends meetings of clubs or organizations: Not on file    Relationship status: Not on file  Other Topics Concern  . Not on file  Social History Narrative      Exercise:  None, running causes head pain   Diet:  FF once daily, + fruits and veggies, + H2O, + Soda   Lives with mom in a one story home.  No children.  Currently not working.  Trying to get disability.  Education: college.    Additional Social History:                         Sleep: Fair  Appetite:  Fair  Current Medications: Current Facility-Administered Medications  Medication Dose Route Frequency Provider Last Rate Last Dose  . acetaminophen (TYLENOL) tablet 650 mg  650 mg Oral  Q6H PRN Reggie Pile, MD      . alum & mag hydroxide-simeth (MAALOX/MYLANTA) 200-200-20 MG/5ML suspension 30 mL  30 mL Oral Q4H PRN Reggie Pile, MD      . amoxicillin (AMOXIL) capsule 500 mg  500 mg Oral Q8H ,  T, MD      . atorvastatin (LIPITOR) tablet 40 mg  40 mg Oral Daily Reggie Pile, MD   40 mg at 03/27/18 0910  . diazepam (VALIUM) tablet 5 mg  5 mg Oral TID , Jackquline Denmark, MD   Stopped at 03/27/18 1743  . FLUoxetine (PROZAC) capsule 40 mg  40 mg Oral Daily Reggie Pile, MD   40 mg at 03/27/18 1610  . furosemide (LASIX) tablet 20 mg  20 mg Oral Daily Reggie Pile, MD   20 mg at 03/27/18 0910  . gabapentin (NEURONTIN) capsule 800 mg  800 mg Oral TID Reggie Pile, MD   800 mg at 03/27/18 1743  . hydrOXYzine (ATARAX/VISTARIL) tablet 25 mg  25 mg Oral BID Reggie Pile, MD   25 mg at 03/27/18 1743  . losartan (COZAAR) tablet 100 mg  100 mg Oral Daily Reggie Pile, MD   100 mg at 03/27/18 0911  . magnesium hydroxide (MILK OF MAGNESIA) suspension 30 mL  30 mL Oral Daily PRN Reggie Pile, MD      . methocarbamol (ROBAXIN) tablet 750 mg  750 mg Oral Q12H PRN Reggie Pile, MD      . metoprolol succinate (TOPROL-XL) 24 hr tablet 50 mg  50 mg Oral Daily Reggie Pile, MD   50 mg at 03/26/18 9604  . OLANZapine (ZYPREXA) tablet 10 mg  10 mg Oral QHS Reggie Pile, MD   10 mg at 03/26/18 2142  . OLANZapine zydis (ZYPREXA) disintegrating tablet 5 mg  5 mg Oral Daily Reggie Pile, MD   5 mg at 03/27/18 0910  . pantoprazole (PROTONIX) EC tablet 40 mg  40 mg Oral Daily Reggie Pile, MD   40 mg at 03/27/18 0910  . traZODone (DESYREL) tablet 100 mg  100 mg Oral QHS PRN Reggie Pile, MD   100 mg at 03/26/18 2141  . Vitamin D (Ergocalciferol) (DRISDOL) capsule 50,000 Units  50,000 Units Oral Once per day on Mon Thu Joshi, Anand, MD   50,000 Units at 03/26/18 5409    Lab Results: No results found for this or any previous visit (from the past 48 hour(s)).  Blood Alcohol level:  Lab Results   Component Value Date   ETH <10 03/22/2018  ETH <11 04/27/2012    Metabolic Disorder Labs: Lab Results  Component Value Date   HGBA1C 5.9 11/01/2015   No results found for: PROLACTIN Lab Results  Component Value Date   CHOL 191 03/18/2016   TRIG 363.0 (H) 03/18/2016   HDL 35.60 (L) 03/18/2016   CHOLHDL 5 03/18/2016   VLDL 72.6 (H) 03/18/2016   LDLCALC 84 11/01/2015   LDLCALC 102 (H) 09/28/2010    Physical Findings: AIMS: Facial and Oral Movements Muscles of Facial Expression: None, normal Lips and Perioral Area: None, normal Jaw: None, normal Tongue: None, normal,Extremity Movements Upper (arms, wrists, hands, fingers): None, normal Lower (legs, knees, ankles, toes): None, normal, Trunk Movements Neck, shoulders, hips: None, normal, Overall Severity Severity of abnormal movements (highest score from questions above): None, normal Incapacitation due to abnormal movements: None, normal Patient's awareness of abnormal movements (rate only patient's report): No Awareness, Dental Status Current problems with teeth and/or dentures?: No Does patient usually wear dentures?: No  CIWA:    COWS:     Musculoskeletal: Strength & Muscle Tone: within normal limits Gait & Station: unable to stand Patient leans: N/A  Psychiatric Specialty Exam: Physical Exam  Nursing note and vitals reviewed. Constitutional: He appears well-developed and well-nourished.  HENT:  Head: Normocephalic and atraumatic.  Eyes: Pupils are equal, round, and reactive to light. Conjunctivae are normal.  Neck: Normal range of motion.  Cardiovascular: Regular rhythm and normal heart sounds.  Respiratory: Effort normal. No respiratory distress.  GI: Soft.  Musculoskeletal: Normal range of motion.  Neurological: He is alert.  Skin: Skin is warm and dry.  Psychiatric: His affect is blunt. His speech is delayed. He is slowed. Cognition and memory are impaired. He expresses impulsivity. He expresses no  homicidal and no suicidal ideation.    Review of Systems  Constitutional: Negative.   HENT: Negative.   Eyes: Negative.   Respiratory: Negative.   Cardiovascular: Negative.   Gastrointestinal: Negative.   Musculoskeletal: Negative.   Skin: Negative.   Neurological: Negative.   Psychiatric/Behavioral: Positive for memory loss. Negative for depression, hallucinations, substance abuse and suicidal ideas. The patient has insomnia. The patient is not nervous/anxious.     Blood pressure 113/72, pulse 89, temperature 97.6 F (36.4 C), temperature source Oral, resp. rate 18, height  (1.93 m), weight 104.8 kg, SpO2 93 %.Body mass index is 28.12 kg/m.  General Appearance: Casual  Eye Contact:  Minimal  Speech:  Slow  Volume:  Decreased  Mood:  Dysphoric  Affect:  Flat  Thought Process:  Coherent  Orientation:  Full (Time, Place, and Person)  Thought Content:  Logical  Suicidal Thoughts:  No  Homicidal Thoughts:  No  Memory:  Immediate;   Fair Recent;   Poor Remote;   Poor  Judgement:  Impaired  Insight:  Shallow  Psychomotor Activity:  Decreased  Concentration:  Concentration: Poor  Recall:  Poor  Fund of Knowledge:  Fair  Language:  Fair  Akathisia:  No  Handed:  Right  AIMS (if indicated):     Assets:  Desire for Improvement Housing Social Support  ADL's:  Impaired  Cognition:  Impaired,  Mild  Sleep:  Number of Hours: 2.75     Treatment Plan Summary: Daily contact with patient to assess and evaluate symptoms and progress in treatment, Medication management and Plan Patient seen chart reviewed.  Gradually getting better and improving.  Does not sleep much at night but is sleeping during the day still.  Patient has not  suicidal but still has some intermittent psychosis.  Medical problems may be contributing as well.  Added antibiotic for the possibility of a pneumonia although he is not running a fever and has not been coughing.  Continue monitoring without any other  changes and expect possible discharge by the end of the weekend.  [name], MD 03/27/2018, 8:17 PM

## 2018-03-27 NOTE — Plan of Care (Signed)
Patient alert and oriented to person and situation, however, he is disoriented to place and time. Patient presents in a pleasant mood on assessment stating that he slept ok last night and had no major complaints to voice to this Clinical research associate. Patient's speech is garbled/incoherent at times, but he can also form meaningful sentences. Patient denies SI, HI, AVH, and pain at this time. Patient also denies any signs/symptoms of depression and anxiety to this Clinical research associate. Patient's goal for today is to "get my nephew a present". Patient has been present in the milieu for meals as well as for visitation without any issues thus far. Patient has been in compliance with his prescribed therapeutic regimen. Patient has been free from injury thus far and remains safe on the unit at this time.  Problem: Education: Goal: Utilization of techniques to improve thought processes will improve Outcome: Progressing Goal: Knowledge of the prescribed therapeutic regimen will improve Outcome: Progressing   Problem: Activity: Goal: Interest or engagement in leisure activities will improve Outcome: Progressing Goal: Imbalance in normal sleep/wake cycle will improve Outcome: Progressing   Problem: Coping: Goal: Coping ability will improve Outcome: Progressing Goal: Will verbalize feelings Outcome: Progressing   Problem: Health Behavior/Discharge Planning: Goal: Ability to make decisions will improve Outcome: Progressing Goal: Compliance with therapeutic regimen will improve Outcome: Progressing   Problem: Role Relationship: Goal: Will demonstrate positive changes in social behaviors and relationships Outcome: Progressing   Problem: Safety: Goal: Ability to disclose and discuss suicidal ideas will improve Outcome: Progressing Goal: Ability to identify and utilize support systems that promote safety will improve Outcome: Progressing   Problem: Self-Concept: Goal: Will verbalize positive feelings about self Outcome:  Progressing Goal: Level of anxiety will decrease Outcome: Progressing   Problem: Education: Goal: Knowledge of [facility] Education information/materials will improve Outcome: Progressing Goal: Emotional status will improve Outcome: Progressing Goal: Mental status will improve Outcome: Progressing Goal: Verbalization of understanding the information provided will improve Outcome: Progressing   Problem: Activity: Goal: Interest or engagement in activities will improve Outcome: Progressing Goal: Sleeping patterns will improve Outcome: Progressing   Problem: Coping: Goal: Ability to verbalize frustrations and anger appropriately will improve Outcome: Progressing Goal: Ability to demonstrate self-control will improve Outcome: Progressing   Problem: Health Behavior/Discharge Planning: Goal: Identification of resources available to assist in meeting health care needs will improve Outcome: Progressing Goal: Compliance with treatment plan for underlying cause of condition will improve Outcome: Progressing   Problem: Physical Regulation: Goal: Ability to maintain clinical measurements within normal limits will improve Outcome: Progressing   Problem: Safety: Goal: Periods of time without injury will increase Outcome: Progressing

## 2018-03-27 NOTE — Progress Notes (Signed)
Recreation Therapy Notes   Date: 03/27/2018  Time: 9:30 am   Location: Craft room   Behavioral response: N/A   Intervention Topic: Leisure  Discussion/Intervention: Patient did not attend group.   Clinical Observations/Feedback:  Patient did not attend group.   Tyia Binford LRT/CTRS        Fabian Coca 03/27/2018 11:32 AM

## 2018-03-27 NOTE — Progress Notes (Signed)
Patient awake no distress noted , patient continues to be on 1:1, sitter at bedside, patient was noted agitated, anxious he kept saying " I am going home today, l should have gone yesterday" patient was advised that there is no present order to go home. 15 minutes safety checks maintained will continue monitor

## 2018-03-27 NOTE — Progress Notes (Addendum)
D- Patient alert and oriented to person and situation, however, he is disoriented to place and time. Patient presents in a pleasant mood on assessment stating that he slept ok last night and had no major complaints to voice to this Clinical research associate. Patient's speech is garbled/incoherent at times, but he can also form meaningful sentences. Patient denies SI, HI, AVH, and pain at this time. Patient also denies any signs/symptoms of depression and anxiety to this Clinical research associate. Patient's goal for today is to "get my nephew a present".  A- Scheduled medications administered to patient, per MD orders. Support and encouragement provided.  Routine safety checks conducted every 15 minutes.  Patient informed to notify staff with problems or concerns.   R- No adverse drug reactions noted. Patient contracts for safety at this time. Patient compliant with medications and treatment plan. Patient receptive, calm, and cooperative. Patient interacts well with others on the unit.  Patient remains safe at this time.

## 2018-03-27 NOTE — Progress Notes (Signed)
Patient on, 1:1 sitter at bedside, resting quietly in bed no distress noted, 15 minutes safety checks maintained will continue to monitor.

## 2018-03-28 ENCOUNTER — Encounter: Payer: Self-pay | Admitting: Radiology

## 2018-03-28 ENCOUNTER — Inpatient Hospital Stay: Payer: PPO

## 2018-03-28 ENCOUNTER — Other Ambulatory Visit: Payer: Self-pay

## 2018-03-28 ENCOUNTER — Inpatient Hospital Stay
Admission: AD | Admit: 2018-03-28 | Discharge: 2018-04-08 | DRG: 871 | Disposition: A | Discharge status: Home Health | Payer: PPO | Attending: Family Medicine | Admitting: Family Medicine

## 2018-03-28 DIAGNOSIS — A419 Sepsis, unspecified organism: Principal | ICD-10-CM | POA: Diagnosis present

## 2018-03-28 DIAGNOSIS — G9341 Metabolic encephalopathy: Secondary | ICD-10-CM | POA: Diagnosis present

## 2018-03-28 DIAGNOSIS — R0902 Hypoxemia: Secondary | ICD-10-CM

## 2018-03-28 DIAGNOSIS — K5900 Constipation, unspecified: Secondary | ICD-10-CM | POA: Diagnosis not present

## 2018-03-28 DIAGNOSIS — R339 Retention of urine, unspecified: Secondary | ICD-10-CM | POA: Diagnosis not present

## 2018-03-28 DIAGNOSIS — F419 Anxiety disorder, unspecified: Secondary | ICD-10-CM | POA: Diagnosis not present

## 2018-03-28 DIAGNOSIS — F259 Schizoaffective disorder, unspecified: Secondary | ICD-10-CM | POA: Diagnosis not present

## 2018-03-28 DIAGNOSIS — M79606 Pain in leg, unspecified: Secondary | ICD-10-CM | POA: Diagnosis present

## 2018-03-28 DIAGNOSIS — F13239 Sedative, hypnotic or anxiolytic dependence with withdrawal, unspecified: Secondary | ICD-10-CM | POA: Diagnosis present

## 2018-03-28 DIAGNOSIS — Z79899 Other long term (current) drug therapy: Secondary | ICD-10-CM

## 2018-03-28 DIAGNOSIS — J9811 Atelectasis: Secondary | ICD-10-CM | POA: Diagnosis not present

## 2018-03-28 DIAGNOSIS — J9601 Acute respiratory failure with hypoxia: Secondary | ICD-10-CM | POA: Diagnosis present

## 2018-03-28 DIAGNOSIS — F258 Other schizoaffective disorders: Secondary | ICD-10-CM

## 2018-03-28 DIAGNOSIS — J942 Hemothorax: Secondary | ICD-10-CM | POA: Diagnosis present

## 2018-03-28 DIAGNOSIS — R948 Abnormal results of function studies of other organs and systems: Secondary | ICD-10-CM | POA: Diagnosis present

## 2018-03-28 DIAGNOSIS — G8929 Other chronic pain: Secondary | ICD-10-CM | POA: Diagnosis present

## 2018-03-28 DIAGNOSIS — J189 Pneumonia, unspecified organism: Secondary | ICD-10-CM | POA: Diagnosis not present

## 2018-03-28 DIAGNOSIS — R440 Auditory hallucinations: Secondary | ICD-10-CM | POA: Diagnosis present

## 2018-03-28 DIAGNOSIS — Z8249 Family history of ischemic heart disease and other diseases of the circulatory system: Secondary | ICD-10-CM

## 2018-03-28 DIAGNOSIS — Z8349 Family history of other endocrine, nutritional and metabolic diseases: Secondary | ICD-10-CM | POA: Diagnosis not present

## 2018-03-28 DIAGNOSIS — F329 Major depressive disorder, single episode, unspecified: Secondary | ICD-10-CM | POA: Diagnosis not present

## 2018-03-28 DIAGNOSIS — T50905A Adverse effect of unspecified drugs, medicaments and biological substances, initial encounter: Secondary | ICD-10-CM | POA: Diagnosis present

## 2018-03-28 DIAGNOSIS — F17210 Nicotine dependence, cigarettes, uncomplicated: Secondary | ICD-10-CM | POA: Diagnosis present

## 2018-03-28 DIAGNOSIS — I10 Essential (primary) hypertension: Secondary | ICD-10-CM | POA: Diagnosis not present

## 2018-03-28 DIAGNOSIS — R4781 Slurred speech: Secondary | ICD-10-CM | POA: Diagnosis not present

## 2018-03-28 DIAGNOSIS — R338 Other retention of urine: Secondary | ICD-10-CM | POA: Diagnosis not present

## 2018-03-28 DIAGNOSIS — R000 Tachycardia, unspecified: Secondary | ICD-10-CM | POA: Diagnosis not present

## 2018-03-28 DIAGNOSIS — F05 Delirium due to known physiological condition: Secondary | ICD-10-CM | POA: Diagnosis not present

## 2018-03-28 DIAGNOSIS — N201 Calculus of ureter: Secondary | ICD-10-CM | POA: Diagnosis not present

## 2018-03-28 DIAGNOSIS — K567 Ileus, unspecified: Secondary | ICD-10-CM | POA: Diagnosis not present

## 2018-03-28 DIAGNOSIS — J440 Chronic obstructive pulmonary disease with acute lower respiratory infection: Secondary | ICD-10-CM | POA: Diagnosis present

## 2018-03-28 DIAGNOSIS — R410 Disorientation, unspecified: Secondary | ICD-10-CM | POA: Diagnosis present

## 2018-03-28 DIAGNOSIS — R748 Abnormal levels of other serum enzymes: Secondary | ICD-10-CM | POA: Diagnosis not present

## 2018-03-28 DIAGNOSIS — Z818 Family history of other mental and behavioral disorders: Secondary | ICD-10-CM

## 2018-03-28 DIAGNOSIS — J90 Pleural effusion, not elsewhere classified: Secondary | ICD-10-CM | POA: Diagnosis not present

## 2018-03-28 DIAGNOSIS — K5903 Drug induced constipation: Secondary | ICD-10-CM | POA: Diagnosis not present

## 2018-03-28 DIAGNOSIS — I4581 Long QT syndrome: Secondary | ICD-10-CM | POA: Diagnosis not present

## 2018-03-28 DIAGNOSIS — F250 Schizoaffective disorder, bipolar type: Secondary | ICD-10-CM | POA: Diagnosis not present

## 2018-03-28 LAB — BLOOD GAS, ARTERIAL
Acid-Base Excess: 14.4 mmol/L — ABNORMAL HIGH (ref 0.0–2.0)
Bicarbonate: 41.8 mmol/L — ABNORMAL HIGH (ref 20.0–28.0)
FIO2: 0.28
O2 Saturation: 93.5 %
Patient temperature: 37
pCO2 arterial: 63 mmHg — ABNORMAL HIGH (ref 32.0–48.0)
pH, Arterial: 7.43 (ref 7.350–7.450)
pO2, Arterial: 67 mmHg — ABNORMAL LOW (ref 83.0–108.0)

## 2018-03-28 LAB — COMPREHENSIVE METABOLIC PANEL WITH GFR
ALT: 24 U/L (ref 0–44)
AST: 25 U/L (ref 15–41)
Albumin: 3.5 g/dL (ref 3.5–5.0)
Alkaline Phosphatase: 109 U/L (ref 38–126)
Anion gap: 12 (ref 5–15)
BUN: 24 mg/dL — ABNORMAL HIGH (ref 6–20)
CO2: 35 mmol/L — ABNORMAL HIGH (ref 22–32)
Calcium: 8.8 mg/dL — ABNORMAL LOW (ref 8.9–10.3)
Chloride: 94 mmol/L — ABNORMAL LOW (ref 98–111)
Creatinine, Ser: 1.28 mg/dL — ABNORMAL HIGH (ref 0.61–1.24)
GFR calc Af Amer: >60 mL/min
GFR calc non Af Amer: >60 mL/min
Glucose, Bld: 122 mg/dL — ABNORMAL HIGH (ref 70–99)
Potassium: 3.6 mmol/L (ref 3.5–5.1)
Sodium: 141 mmol/L (ref 135–145)
Total Bilirubin: 0.8 mg/dL (ref 0.3–1.2)
Total Protein: 6.8 g/dL (ref 6.5–8.1)

## 2018-03-28 LAB — TSH: TSH: 0.568 u[IU]/mL (ref 0.350–4.500)

## 2018-03-28 LAB — VITAMIN B12: Vitamin B-12: 4778 pg/mL — ABNORMAL HIGH (ref 180–914)

## 2018-03-28 LAB — CBC
HCT: 44.7 % (ref 39.0–52.0)
Hemoglobin: 14.6 g/dL (ref 13.0–17.0)
MCH: 28.1 pg (ref 26.0–34.0)
MCHC: 32.7 g/dL (ref 30.0–36.0)
MCV: 86.1 fL (ref 80.0–100.0)
Platelets: 229 10*3/uL (ref 150–400)
RBC: 5.19 MIL/uL (ref 4.22–5.81)
RDW: 13.6 % (ref 11.5–15.5)
WBC: 12.6 10*3/uL — ABNORMAL HIGH (ref 4.0–10.5)
nRBC: 0 % (ref 0.0–0.2)

## 2018-03-28 LAB — AMMONIA: Ammonia: 23 umol/L (ref 9–35)

## 2018-03-28 LAB — FIBRIN DERIVATIVES D-DIMER (ARMC ONLY): Fibrin derivatives D-dimer (ARMC): 661.47 ng{FEU}/mL — ABNORMAL HIGH (ref 0.00–499.00)

## 2018-03-28 LAB — TROPONIN I: Troponin I: <0.03 ng/mL

## 2018-03-28 LAB — BRAIN NATRIURETIC PEPTIDE: B Natriuretic Peptide: 49 pg/mL (ref 0.0–100.0)

## 2018-03-28 MED ORDER — ONDANSETRON HCL 4 MG PO TABS: 4.0000 mg | ORAL_TABLET | Freq: Four times a day (QID) | ORAL | Status: DC | PRN

## 2018-03-28 MED ORDER — SODIUM CHLORIDE 0.9 % IV SOLN: 500.0000 mL | Freq: Once | INTRAVENOUS | Status: DC

## 2018-03-28 MED ORDER — IPRATROPIUM-ALBUTEROL 0.5-2.5 (3) MG/3ML IN SOLN: 3.0000 mL | RESPIRATORY_TRACT | Status: DC

## 2018-03-28 MED ORDER — ENOXAPARIN SODIUM 40 MG/0.4ML ~~LOC~~ SOLN
40.0000 mg | SUBCUTANEOUS | Status: DC
  Administered 2018-03-28 – 2018-04-02 (×6): 40 mg via SUBCUTANEOUS
  Filled 2018-03-28 (×6): qty 0.4

## 2018-03-28 MED ORDER — ONDANSETRON HCL 4 MG/2ML IJ SOLN: 4.0000 mg | Freq: Four times a day (QID) | INTRAMUSCULAR | Status: DC | PRN

## 2018-03-28 MED ORDER — MIDAZOLAM HCL 2 MG/2ML IJ SOLN: 2.0000 mg | Freq: Once | INTRAMUSCULAR | Status: DC

## 2018-03-28 MED ORDER — LEVOFLOXACIN IN D5W 750 MG/150ML IV SOLN
750.0000 mg | INTRAVENOUS | Status: AC
  Administered 2018-03-28 – 2018-03-31 (×4): 750 mg via INTRAVENOUS
  Filled 2018-03-28 (×4): qty 150

## 2018-03-28 MED ORDER — LACTATED RINGERS IV SOLN
INTRAVENOUS | Status: DC
  Administered 2018-03-28 – 2018-03-29 (×2): via INTRAVENOUS

## 2018-03-28 MED ORDER — ALBUTEROL SULFATE (2.5 MG/3ML) 0.083% IN NEBU: 2.5000 mg | INHALATION_SOLUTION | RESPIRATORY_TRACT | Status: DC | PRN

## 2018-03-28 MED ORDER — ACETAMINOPHEN 650 MG RE SUPP: 650.0000 mg | Freq: Four times a day (QID) | RECTAL | Status: DC | PRN

## 2018-03-28 MED ORDER — POLYETHYLENE GLYCOL 3350 17 G PO PACK: 17.0000 g | PACK | Freq: Every day | ORAL | Status: DC | PRN

## 2018-03-28 MED ORDER — OLANZAPINE 5 MG PO TABS: 5.0000 mg | ORAL_TABLET | Freq: Every day | ORAL | Status: DC

## 2018-03-28 MED ORDER — IOHEXOL 350 MG/ML SOLN
75.0000 mL | Freq: Once | INTRAVENOUS | Status: AC | PRN
  Administered 2018-03-28: 75 mL via INTRAVENOUS

## 2018-03-28 MED ORDER — ACETAMINOPHEN 325 MG PO TABS
650.0000 mg | ORAL_TABLET | Freq: Four times a day (QID) | ORAL | Status: DC | PRN
  Administered 2018-03-30: 650 mg via ORAL
  Filled 2018-03-28: qty 2

## 2018-03-28 NOTE — Progress Notes (Signed)
[facility] MD Progress Note  03/28/2018 9:23 AM Jeffrey Lin  MRN:  [id number]   CC: not stated  HPI: Staff report over the past 24 hours the patient has had difficulty with altered mental status, he has been on 1:1 due to inability to ambulate or participate in his ADL's. They noted that he has been unable to take medications without them being crushed. Reportedly the patient was hypoxic ~ 64 early this morning, unclear why this was not reported/rapid response called during this period  This morning the staff reported the patient has continued to be lethargic but arousable, pulse ox ~ 80 while he is alert, but this is not sustained for longer than a few moments. He was able to state his full name, and briefly noted that he was in a hospital. Other responses were unintelligible. A rapid response was called due to altered mental status and hypoxia. Will plan on discharging the patient to the medical floor for the time being, will follow as psychiatry consult.  Principal Problem: Schizoaffective disorder (HCC) Diagnosis: Principal Problem:   Schizoaffective disorder (HCC) Active Problems:   Chronic back pain   Subacute delirium  Total Time spent with patient: 45 minutes  Past Psychiatric History: history of Schizoaffective disorder and unclear cognitive disorders   Past Medical History:  Past Medical History:  Diagnosis Date  . Anxiety   . Chronic leg pain   . Depression   . Essential hypertension, benign 10/20/2015  . Schizoaffective disorder [facility])     Past Surgical History:  Procedure Laterality Date  . Block procedure at pain center  03/27/06  . Bone graft from hip to jaw  2004  . Bone graft right side of head to jaw  2006  . 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 Sphenopalatine ganglionic     Family History:  Family History  Problem Relation Age of Onset  . Hyperlipidemia Mother   . Hypertension Mother   . Depression Mother   . Anxiety disorder Mother   . Depression  Brother   . Anxiety disorder Brother   . Bipolar disorder Maternal Grandfather   . Schizophrenia Maternal Grandfather   . Bipolar disorder Paternal Grandfather    Family Psychiatric  History: see h+p Social History:  Social History   Substance and Sexual Activity  Alcohol Use No  . Alcohol/week: 0.0 standard drinks     Social History   Substance and Sexual Activity  Drug Use No    Social History   Socioeconomic History  . Marital status: Single    Spouse name: Not on file  . Number of children: 0  . Years of education: 23  . Highest education level: Not on file  Occupational History  . Occupation: Marketing executive    Employer: [employer]  Social Needs  . Financial resource strain: Not on file  . Food insecurity:    Worry: Not on file    Inability: Not on file  . Transportation needs:    Medical: Not on file    Non-medical: Not on file  Tobacco Use  . Smoking status: Current Every Day Smoker    Packs/day: 1.00    Years: 23.00    Pack years: 23.00    Types: Cigarettes    Start date: 09/22/1995  . Smokeless tobacco: Former Engineer, water and Sexual Activity  . Alcohol use: No    Alcohol/week: 0.0 standard drinks  . Drug use: No  .  Sexual activity: Not Currently  Lifestyle  . Physical activity:    Days per week: Not on file    Minutes per session: Not on file  . Stress: Not on file  Relationships  . Social connections:    Talks on phone: Not on file    Gets together: Not on file    Attends religious service: Not on file    Active member of club or organization: Not on file    Attends meetings of clubs or organizations: Not on file    Relationship status: Not on file  Other Topics Concern  . Not on file  Social History Narrative      Exercise:  None, running causes head pain   Diet:  FF once daily, + fruits and veggies, + H2O, + Soda   Lives with mom in a one story home.  No children.  Currently not working.  Trying to get  disability.  Education: college.    Additional Social History:                         Sleep: Unable to assess subjective report  Appetite:  Unable to assess subjective report  Current Medications: Current Facility-Administered Medications  Medication Dose Route Frequency Provider Last Rate Last Dose  . acetaminophen (TYLENOL) tablet 650 mg  650 mg Oral Q6H PRN Reggie PileJoshi, Anand, MD      . alum & mag hydroxide-simeth (MAALOX/MYLANTA) 200-200-20 MG/5ML suspension 30 mL  30 mL Oral Q4H PRN Reggie PileJoshi, Anand, MD      . amoxicillin (AMOXIL) capsule 500 mg  500 mg Oral Q8H Clapacs, Jackquline DenmarkJohn T, MD   500 mg at 03/28/18 [id number]  . atorvastatin (LIPITOR) tablet 40 mg  40 mg Oral Daily Reggie PileJoshi, Anand, MD   40 mg at 03/27/18 0910  . FLUoxetine (PROZAC) capsule 40 mg  40 mg Oral Daily Reggie PileJoshi, Anand, MD   40 mg at 03/27/18 [id number]  . furosemide (LASIX) tablet 20 mg  20 mg Oral Daily Reggie PileJoshi, Anand, MD   20 mg at 03/27/18 0910  . losartan (COZAAR) tablet 100 mg  100 mg Oral Daily Reggie PileJoshi, Anand, MD   100 mg at 03/27/18 0911  . magnesium hydroxide (MILK OF MAGNESIA) suspension 30 mL  30 mL Oral Daily PRN Reggie PileJoshi, Anand, MD      . metoprolol succinate (TOPROL-XL) 24 hr tablet 50 mg  50 mg Oral Daily Reggie PileJoshi, Anand, MD   50 mg at 03/26/18 [id number]  . OLANZapine (ZYPREXA) tablet 5 mg  5 mg Oral QHS Idelle Reimann R, DO      . pantoprazole (PROTONIX) EC tablet 40 mg  40 mg Oral Daily Reggie PileJoshi, Anand, MD   40 mg at 03/27/18 0910  . Vitamin D (Ergocalciferol) (DRISDOL) capsule 50,000 Units  50,000 Units Oral Once per day on Mon Thu Joshi, Theadora RamaAnand, MD   50,000 Units at 03/26/18 0802    Lab Results:  Results for orders placed or performed during the hospital encounter of 03/22/18 (from the past 48 hour(s))  CBC     Status: Abnormal   Collection Time: 03/28/18  6:27 AM  Result Value Ref Range   WBC 12.6 (H) 4.0 - 10.5 K/uL   RBC 5.19 4.22 - 5.81 MIL/uL   Hemoglobin 14.6 13.0 - 17.0 g/dL   HCT 81.144.7 91.439.0 - 78.252.0 %   MCV 86.1 80.0 - 100.0  fL   MCH 28.1 26.0 - 34.0 pg   MCHC 32.7 30.0 -  36.0 g/dL   RDW 16.1 09.6 - 04.5 %   Platelets 229 150 - 400 K/uL   nRBC 0.0 0.0 - 0.2 %    Comment: Performed at [facility], [address] [place], [place] [address]  Blood gas, arterial     Status: Abnormal   Collection Time: 03/28/18  9:06 AM  Result Value Ref Range   FIO2 0.28    Delivery systems NASAL CANNULA    pH, Arterial 7.43 7.350 - 7.450   pCO2 arterial 63 (H) 32.0 - 48.0 mmHg   pO2, Arterial 67 (L) 83.0 - 108.0 mmHg   Bicarbonate 41.8 (H) 20.0 - 28.0 mmol/L   Acid-Base Excess 14.4 (H) 0.0 - 2.0 mmol/L   O2 Saturation 93.5 %   Patient temperature 37.0    Collection site RIGHT BRACHIAL    Sample type ARTERIAL DRAW    Allens test (pass/fail) PASS PASS    Comment: Performed at [facility], [address] [place], [place] [address]    Blood Alcohol level:  Lab Results  Component Value Date   [facility] <10 03/22/2018   ETH <11 04/27/2012    Metabolic Disorder Labs: Lab Results  Component Value Date   HGBA1C 5.9 11/01/2015   No results found for: PROLACTIN Lab Results  Component Value Date   CHOL 191 03/18/2016   TRIG 363.0 (H) 03/18/2016   HDL 35.60 (L) 03/18/2016   CHOLHDL 5 03/18/2016   VLDL 72.6 (H) 03/18/2016   LDLCALC 84 11/01/2015   LDLCALC 102 (H) 09/28/2010    Physical Findings: AIMS: Facial and Oral Movements Muscles of Facial Expression: None, normal Lips and Perioral Area: None, normal Jaw: None, normal Tongue: None, normal,Extremity Movements Upper (arms, wrists, hands, fingers): None, normal Lower (legs, knees, ankles, toes): None, normal, Trunk Movements Neck, shoulders, hips: None, normal, Overall Severity Severity of abnormal movements (highest score from questions above): None, normal Incapacitation due to abnormal movements: None, normal Patient's awareness of abnormal movements (rate only patient's report): No Awareness, Dental Status Current problems with teeth  and/or dentures?: No Does patient usually wear dentures?: No  CIWA:    COWS:     Musculoskeletal: Strength & Muscle Tone: decreased Gait & Station: unable to stand Patient leans: N/A  Psychiatric Specialty Exam: Physical Exam  ROS  Blood pressure 106/74, pulse (!) 110, temperature 97.6 F (36.4 C), temperature source Oral, resp. rate 14, height  (1.93 m), weight 104.8 kg, SpO2 (!) 80 %.Body mass index is 28.12 kg/m.  General Appearance: Disheveled  Eye Contact:  Poor  Speech:  Garbled, Slow and Slurred  Volume:  Decreased  Mood:  not stated  Affect:  lethargic  Thought Process:  Disorganized  Orientation:  Other:  person, place  Thought Content:  Unable to assess  Suicidal Thoughts:  unable to assess  Homicidal Thoughts:  Unable to assess  Memory:  impaired  Judgement:  Impaired  Insight:  impaired  Psychomotor Activity:  Retardation/lethargy  Concentration:  poor  Recall:  unable to assess, likely impaired  Fund of Knowledge:  Poor  Language:  Poor  Akathisia:  NA  Handed:  Right  AIMS (if indicated):     Assets:  Social Support  ADL's:  Impaired  Cognition:  Impaired,  Severe  Sleep:  Number of Hours: 2.75     Treatment Plan Summary:  Reviewed patient medications, Discontinued medications that could contributed to his altered mental status/respiratory functioning (vistaril, olanazapine, valium, PRN muscle relaxer)  Per rapid response  will discharge the patient to the medical floor, will follow as psychiatric consult.    Luciano Cutter, DO 03/28/2018, 9:23 AM

## 2018-03-28 NOTE — H&P (Addendum)
[facility] [place] at [facility]   PATIENT NAME: Jeffrey Lin    MR#:  [id number]  DATE OF BIRTH:  10/22/1975  DATE OF ADMISSION:  03/28/2018  PRIMARY CARE PHYSICIAN: Excell Seltzer, MD   REQUESTING/REFERRING PHYSICIAN: Dr. Katrinka Blazing  CHIEF COMPLAINT:  No chief complaint on file.  Hypoxia  HISTORY OF PRESENT ILLNESS:  Jeffrey Lin  is a 43 y.o. male with a known history of schedule affective disorder, hypertension, depression admitted to behavioral health unit over the last 2 days and is being treated with some sedative medications was found to be extremely drowsy and cyanotic.  Was placed on status oxygen after rapid response was called.  Found to be hypoxic in the 80s on room air.  Patient opens his eyes but sleeps.  Stat ABG was checked which shows normal pH with PCO2 63 and bicarb of 41.  He is afebrile.  Tachycardic up to 110. Patient unable to give any history History obtained from behavioral health unit staff, psychiatrist Dr. Katrinka Blazing, old records are reviewed  PAST MEDICAL HISTORY:   Past Medical History:  Diagnosis Date  . Anxiety   . Chronic leg pain   . Depression   . Essential hypertension, benign 10/20/2015  . Schizoaffective disorder (HCC)     PAST SURGICAL HISTORY:   Past Surgical History:  Procedure Laterality Date  . Block procedure at pain center  03/27/06  . Bone graft from hip to jaw  2004  . Bone graft right side of head to jaw  2006  . 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 Sphenopalatine ganglionic      SOCIAL HISTORY:   Social History   Tobacco Use  . Smoking status: Current Every Day Smoker    Packs/day: 1.00    Years: 23.00    Pack years: 23.00    Types: Cigarettes    Start date: 09/22/1995  . Smokeless tobacco: Former Engineer, water Use Topics  . Alcohol use: No    Alcohol/week: 0.0 standard drinks    FAMILY HISTORY:   Family History  Problem Relation Age of Onset  . Hyperlipidemia Mother   . Hypertension  Mother   . Depression Mother   . Anxiety disorder Mother   . Depression Brother   . Anxiety disorder Brother   . Bipolar disorder Maternal Grandfather   . Schizophrenia Maternal Grandfather   . Bipolar disorder Paternal Grandfather     DRUG ALLERGIES:   Allergies  Allergen Reactions  . Toradol [Ketorolac Tromethamine] Swelling  . Lamotrigine Rash    REVIEW OF SYSTEMS:   Review of Systems  Unable to perform ROS: Mental status change    MEDICATIONS AT HOME:   Prior to Admission medications   Medication Sig Start Date End Date Taking? Authorizing Provider  amantadine (SYMMETREL) 100 MG capsule Take 100 mg by mouth 2 (two) times daily. 08/06/15   [provider]  amphetamine-dextroamphetamine (ADDERALL) 20 MG tablet Take 20 mg by mouth 3 (three) times daily. 12/07/17   [provider]  atorvastatin (LIPITOR) 40 MG tablet TAKE 1 TABLET BY MOUTH EVERY DAY 04/28/17   Bedsole, Amy E, MD  benztropine (COGENTIN) 1 MG tablet Take 1 mg by mouth at bedtime.  08/28/15   [provider]  diazepam (VALIUM) 10 MG tablet Take 10 mg by mouth 4 (four) times daily.  09/12/15   [provider]  FLUoxetine (PROZAC) 40 MG capsule Take 40 mg by mouth daily. 02/27/17  [provider]  furosemide (LASIX) 20 MG tablet TAKE 1 TABLET BY MOUTH EVERY DAY 10/30/17   Bedsole, Amy E, MD  gabapentin (NEURONTIN) 800 MG tablet Take 800 mg three x a day. 03/17/18   Patel, Roxana Hires K, DO  hydrOXYzine (VISTARIL) 25 MG capsule TAKE 1 CAPSULE BY MOUTH 4 TIMES A DAY 08/02/15   [provider]  losartan (COZAAR) 100 MG tablet Take 1 tablet (100 mg total) by mouth daily. Please schedule physical 03/05/18   Bedsole, Amy E, MD  methocarbamol (ROBAXIN) 750 MG tablet TAKE 1 TABLET (750 MG TOTAL) BY MOUTH EVERY 12 (TWELVE) HOURS AS NEEDED FOR MUSCLE SPASMS. 02/02/18   Bedsole, Amy E, MD  metoprolol succinate (TOPROL-XL) 50 MG 24 hr tablet Take 1 tablet (50 mg total) by mouth daily. Take  with or immediately following a meal. 04/12/16   Bedsole, Amy E, MD  OLANZapine (ZYPREXA) 10 MG tablet TAKE 1 TABLET BY MOUTH AT BEDTIME (REPLACES SYMBYAX) 02/14/16   [provider]  omeprazole (PRILOSEC) 20 MG capsule Take 20 mg by mouth daily as needed.    [provider]  potassium chloride (K-DUR) 10 MEQ tablet TAKE 1 TABLET BY MOUTH EVERY DAY 02/11/18   Bedsole, Amy E, MD  Vitamin D, Ergocalciferol, (DRISDOL) 50000 units CAPS capsule Take 1 capsule (50,000 Units total) by mouth 2 (two) times a week. 07/01/16   Joaquim Nam, MD     VITAL SIGNS:  There were no vitals taken for this visit.  PHYSICAL EXAMINATION:  Physical Exam  GENERAL:  43 y.o.-year-old patient lying in the bed with no acute distress.  EYES: Pupils equal, round, reactive to light and accommodation. No scleral icterus. Extraocular muscles intact.  HEENT: Head atraumatic, normocephalic. Oropharynx and nasopharynx clear. No oropharyngeal erythema, moist oral mucosa  NECK:  Supple, no jugular venous distention. No thyroid enlargement, no tenderness.  LUNGS: Normal breath sounds bilaterally, no wheezing, rales, rhonchi. No use of accessory muscles of respiration.  CARDIOVASCULAR: S1, S2 normal. No murmurs, rubs, or gallops.  ABDOMEN: Soft, nontender, nondistended. Bowel sounds present. No organomegaly or mass.  EXTREMITIES: No pedal edema, cyanosis, or clubbing. + 2 pedal & radial pulses b/l.   NEUROLOGIC: Moves all 4 extremitites PSYCHIATRIC: The patient is drowzy SKIN: No obvious rash, lesion, or ulcer.   LABORATORY PANEL:   CBC Recent Labs  Lab 03/28/18 0627  WBC 12.6*  HGB 14.6  HCT 44.7  PLT 229   ------------------------------------------------------------------------------------------------------------------  Chemistries  Recent Labs  Lab 03/28/18 0627  NA 141  K 3.6  CL 94*  CO2 35*  GLUCOSE 122*  BUN 24*  CREATININE 1.28*  CALCIUM 8.8*  AST 25  ALT 24  ALKPHOS 109   BILITOT 0.8   ------------------------------------------------------------------------------------------------------------------  Cardiac Enzymes Recent Labs  Lab 03/28/18 0931  TROPONINI <0.03   ------------------------------------------------------------------------------------------------------------------  RADIOLOGY:  Dg Chest Port 1 View  Result Date: 03/28/2018 CLINICAL DATA:  Hypoxia. Smoker. EXAM: PORTABLE CHEST 1 VIEW COMPARISON:  03/24/2018. FINDINGS: Poor inspiration with crowding of the lung markings. The cardiac silhouette is near the upper limit of normal in size. Small to moderate-sized left pleural effusion with a mild increase in amount. Mild increase in patchy and linear density in the left lower lobe. Interval mild atelectasis at the right lung base. Unremarkable bones. IMPRESSION: 1. Poor inspiration with mild right basilar atelectasis. 2. Mildly increased left lower lobe atelectasis or pneumonia. 3. Mild increase in size of a small to moderate-sized left pleural effusion. Electronically Signed  By: Beckie Salts M.D.   On: 03/28/2018 10:09   IMPRESSION AND PLAN:   * Left lower lobe pneumonia Start levaquin Sepsis POA. Tachycardia, leucocytosis Pneumonia order set used IVF  CTA chest ordered to rule out PE due to elevated d-dimer  * Acute metabolic encephalopathy Due to medications and hypoxia Should improve Hold meds  *Schizoaffective disorder with depression Hold sedating medications.  Consult psychiatry.  DVT prophylaxis with Lovenox  All the records are reviewed and case discussed with ED provider. Management plans discussed with the patient, family and they are in agreement.  CODE STATUS: CODE STATUS  TOTAL CRITICAL CARE TIME TAKING CARE OF THIS PATIENT: 40 minutes.   Molinda Bailiff Tahirih Lair M.D on 03/28/2018 at 1:28 PM  Between 7am to 6pm - Pager - [phone]  After 6pm go to [email or web address] - password [email or web address]  [facility]  Office   [phone]  CC: Primary care physician; Excell Seltzer, MD  Note: This dictation was prepared with Dragon dictation along with smaller phrase technology. Any transcriptional errors that result from this process are unintentional.

## 2018-03-28 NOTE — Significant Event (Signed)
Rapid Response Event Note  Overview: Time Called: 0843 Arrival Time: 0847 Event Type: Neurologic, Respiratory  Initial Focused Assessment: called for RR on pt lethargic and decreased 02 sats. Pt laying in bed, responds to repeated stimuli, lips bluish in color.    Interventions: 02 [place] 2L, stat ABG, labs and CXR. Dr Elpidio Anis down to assess pt, along with Psychiatrist Dr Katrinka Blazing who was in room during RR. Will transfer to Tele floor for further workup and treatment. Transferred to room 250 accompanied by sitter Chief Strategy Officer) and [employer]. Shylon RN, received report from [employer].  Plan of Care (if not transferred):  Event Summary: Name of Physician Notified: Sudini at [phone]    at    Outcome: Transferred (Comment)(tx to tele floor for further treatment)  Event End Time: 0930  Sandralee Tarkington A

## 2018-03-28 NOTE — Progress Notes (Signed)
Pharmacy Antibiotic Note  Jeffrey Lin is a 43 y.o. male admitted on 03/28/2018.  Pharmacy has been consulted for levofloxacin dosing for CAP.  Plan: Levofloxacin 750 mg IV q24h     Temp (24hrs), Avg:97.5 F (36.4 C), Min:97.5 F (36.4 C), Max:97.5 F (36.4 C)  Recent Labs  Lab 03/22/18 0855 03/28/18 0627  WBC 9.9 12.6*  CREATININE 1.03 1.28*    Estimated Creatinine Clearance: 101 mL/min (A) (by C-G formula based on SCr of 1.28 mg/dL (H)).    Allergies  Allergen Reactions  . Toradol [Ketorolac Tromethamine] Swelling  . Lamotrigine Rash    Antimicrobials this admission: Levofloxacin 3/7 >>   Dose adjustments this admission: NA  Microbiology results:   Thank you for allowing pharmacy to be a part of this patient's care.  Pricilla Riffle, PharmD Pharmacy Resident  03/28/2018 12:43 PM

## 2018-03-28 NOTE — Plan of Care (Signed)
Patient is on 1;1 and is renewed no distress

## 2018-03-28 NOTE — Progress Notes (Addendum)
Patient ID: Jeffrey Lin, male   DOB: [birth year], 43 y.o.   MRN: [id number]    D: Pt remains on a 1:1 for safety and high fall risk. Reassessed pts vitals signs, his vital signs were  BP 106/74, HR 110, Resp 14, and O2 sat 80%. Pt was very lethargic, he was only oriented to self. Pt has also been incontinent of stool and urine. Dr. Katrinka Blazing made aware of situation, he assessed pt and is working on a medical consult. Dr. Katrinka Blazing also gave orders to hold morning medications.  A: 1:1 continued for patient safety. R: Pts safety maintained.

## 2018-03-28 NOTE — B[facility] Suicide Risk Assessment (Signed)
[facility] Discharge Suicide Risk Assessment   Principal Problem: Schizoaffective disorder [facility]) Discharge Diagnoses: Principal Problem:   Schizoaffective disorder (HCC) Active Problems:   Chronic back pain   Subacute delirium   Total Time spent with patient: 1 hour  Musculoskeletal: Strength & Muscle Tone: decreased Gait & Station: unable to stand Patient leans: N/A  Psychiatric Specialty Exam: Review of Systems  Unable to perform ROS: Patient unresponsive    Blood pressure 106/74, pulse (!) 110, temperature 97.6 F (36.4 C), temperature source Oral, resp. rate 14, height 6\' 4"  (1.93 m), weight 104.8 kg, SpO2 (!) 80 %.Body mass index is 28.12 kg/m.  Psychiatric Specialty Exam: Physical Exam  ROS  Blood pressure 106/74, pulse (!) 110, temperature 97.6 F (36.4 C), temperature source Oral, resp. rate 14, height 6\' 4"  (1.93 m), weight 104.8 kg, SpO2 (!) 80 %.Body mass index is 28.12 kg/m.  General Appearance: Disheveled  Eye Contact:  Poor  Speech:  Garbled, Slow and Slurred  Volume:  Decreased  Mood:  not stated  Affect:  lethargic  Thought Process:  Disorganized  Orientation:  Other:  person, place  Thought Content:  Unable to assess  Suicidal Thoughts:  unable to assess  Homicidal Thoughts:  Unable to assess  Memory:  impaired  Judgement:  Impaired  Insight:  impaired  Psychomotor Activity:  Retardation/lethargy  Concentration:  poor  Recall:  unable to assess, likely impaired  Fund of Knowledge:  Poor  Language:  Poor  Akathisia:  NA  Handed:  Right  AIMS (if indicated):     Assets:  Social Support  ADL's:  Impaired  Cognition:  Impaired,  Severe  Sleep:  Number of Hours: 2.75      Mental Status Per Nursing Assessment::   On Admission:  NA  Demographic Factors:  Caucasian and Low socioeconomic status  Loss Factors: Decline in physical health  Historical Factors: Family history of mental illness or substance abuse  Risk Reduction Factors:   Positive  social support and Positive coping skills or problem solving skills  Continued Clinical Symptoms:  Schizophrenia:   Depressive state Paranoid or undifferentiated type Chronic Pain Previous Psychiatric Diagnoses and Treatments Medical Diagnoses and Treatments/Surgeries  Cognitive Features That Contribute To Risk:  Loss of executive function    Suicide Risk:  Mild:  Suicidal ideation of limited frequency, intensity, duration, and specificity.  There are no identifiable plans, no associated intent, mild dysphoria and related symptoms, good self-control (both objective and subjective assessment), few other risk factors, and identifiable protective factors, including available and accessible social support.  Follow-up Information    Dr. Milagros Evener. Go on 05/13/2018.   Why:  Please follow up with Dr. Evelene Croon on Wednesday, May 13, 2018 at 945am. Contact information: [address] [place] [address] Ph: [phone]          Plan Of Care/Follow-up recommendations:  Discharging to medicine   Luciano Cutter, DO 03/28/2018, 9:38 AM

## 2018-03-28 NOTE — Discharge Summary (Signed)
Physician Discharge Summary Note  Patient:  Jeffrey Lin is an 43 y.o., male MRN:  [id number] DOB:  08-02-1975 Patient phone:  [phone] (home)  Patient address:   [address] [place] [address],  Total Time spent with patient: 1 hour  Date of Admission:  03/22/2018 Date of Discharge: 03/28/18  Reason for Admission:  Psychosis/altered mental status  Principal Problem: Schizoaffective disorder [facility]) Discharge Diagnoses: Principal Problem:   Schizoaffective disorder (HCC) Active Problems:   Chronic back pain   Subacute delirium   Past Psychiatric History: Per admission note  Patient has a history of schizoaffective disorder.  He has had problems with chronic depression and irritability in the past.  Has been on multiple medications.  Has had episodes of psychosis.  I myself saw the patient for electroconvulsive therapy in 2017 with only partial response.  Patient continues to see Dr. Evelene Croon in [place] and apparently continues to take Depakote gabapentin Valium and Adderall.  The Adderall seems to been a recent add on.  Mother is documented as having said she did not think the gabapentin had helped anything.  Patient does not have a history of suicide attempts but does have a history of suicidality  Past Medical History:  Past Medical History:  Diagnosis Date  . Anxiety   . Chronic leg pain   . Depression   . Essential hypertension, benign 10/20/2015  . Schizoaffective disorder [facility])     Past Surgical History:  Procedure Laterality Date  . Block procedure at pain center  03/27/06  . Bone graft from hip to jaw  2004  . Bone graft right side of head to jaw  2006  . 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 Sphenopalatine ganglionic     Family History:  Family History  Problem Relation Age of Onset  . Hyperlipidemia Mother   . Hypertension Mother   . Depression Mother   . Anxiety disorder Mother   . Depression Brother   . Anxiety disorder Brother   .  Bipolar disorder Maternal Grandfather   . Schizophrenia Maternal Grandfather   . Bipolar disorder Paternal Grandfather    Family Psychiatric  History: per admission: History: Bipolar disorder and schizophrenia in several relatives as well as various anxiety disorders Social History:  Social History   Substance and Sexual Activity  Alcohol Use No  . Alcohol/week: 0.0 standard drinks     Social History   Substance and Sexual Activity  Drug Use No    Social History   Socioeconomic History  . Marital status: Single    Spouse name: Not on file  . Number of children: 0  . Years of education: 57  . Highest education level: Not on file  Occupational History  . Occupation: Marketing executive    Employer: [employer]  Social Needs  . Financial resource strain: Not on file  . Food insecurity:    Worry: Not on file    Inability: Not on file  . Transportation needs:    Medical: Not on file    Non-medical: Not on file  Tobacco Use  . Smoking status: Current Every Day Smoker    Packs/day: 1.00    Years: 23.00    Pack years: 23.00    Types: Cigarettes    Start date: 09/22/1995  . Smokeless tobacco: Former Engineer, water and Sexual Activity  . Alcohol use: No    Alcohol/week: 0.0 standard drinks  . Drug use: No  .  Sexual activity: Not Currently  Lifestyle  . Physical activity:    Days per week: Not on file    Minutes per session: Not on file  . Stress: Not on file  Relationships  . Social connections:    Talks on phone: Not on file    Gets together: Not on file    Attends religious service: Not on file    Active member of club or organization: Not on file    Attends meetings of clubs or organizations: Not on file    Relationship status: Not on file  Other Topics Concern  . Not on file  Social History Narrative      Exercise:  None, running causes head pain   Diet:  FF once daily, + fruits and veggies, + H2O, + Soda   Lives with mom in a one story  home.  No children.  Currently not working.  Trying to get disability.  Education: college.     Hospital Course:   The majority of his hospital course is compiled from chart review from earlier this week: The patient was brought to the emergency department by EMS, at that time his mother was reporting altered mental status and psychosis x 1 week. His labwork and head CT were unremarkable at the time of admission. During his initial presentation he was observed by staff to be responding to vivid visual hallucinations. He was placed Valium 10mg  TID, prozac 40mg  po q daily, lasix 20 mg po q daily, gabapentin 800mg  TID, vistaril 25mg  bid, cozaar 100 mg q daily, olanzapine 5mg  po qam and 10mg  po qhs, and protonix 40mg  po q daily. His home medications of symmetrel and aderall were discontinued on admission.The general treatment plan at that time was to decrease his sedating medications slowly while under observation.  Over the course of the next few days he continued to have intermittent worsening of his AVH, notes also report that he was intermittently agitated and paranoid. On 03/24/18 he was ambulatory, however with unsteady gait, and he was placed on a 1:1 order at that time. Notes indicate that he was periodically having hypoxia at that time. His valium was decreased again to 5mg  po TID, the patient continued to present increasingly lethargic and seemingly obtunded with intermittent points of improvement in his mentation. A chest xray from 03/24/18 showed a left basilar infiltrate and effusion, he was placed on amoxacillin on 3/6 and he received two doses.  He was reported to have intermittent hypoxia throughout his stay, first noted on 3/3 per the flowsheets. On the morning on 3/7 at 6:08AM the patient was logged with a pulse ox of 64, there was a rechecked value of 90 logged at 6:39AM. Nursing staff informed the on call physician of the patient's vital sign abnormalities around 8AM, shortly thereafter a rapid  response was called and the patient was placed on oxygen, and transferred to the medical floor.  Physical Findings: AIMS: Facial and Oral Movements Muscles of Facial Expression: None, normal Lips and Perioral Area: None, normal Jaw: None, normal Tongue: None, normal,Extremity Movements Upper (arms, wrists, hands, fingers): None, normal Lower (legs, knees, ankles, toes): None, normal, Trunk Movements Neck, shoulders, hips: None, normal, Overall Severity Severity of abnormal movements (highest score from questions above): None, normal Incapacitation due to abnormal movements: None, normal Patient's awareness of abnormal movements (rate only patient's report): No Awareness, Dental Status Current problems with teeth and/or dentures?: No Does patient usually wear dentures?: No  CIWA:    COWS:  Musculoskeletal: Strength & Muscle Tone: decreased Gait & Station: unable to stand Patient leans: N/A Psychiatric Specialty Exam: Review of Systems  Unable to perform ROS: Patient unresponsive    Blood pressure 106/74, pulse (!) 110, temperature 97.6 F (36.4 C), temperature source Oral, resp. rate 14, height 6\' 4"  (1.93 m), weight 104.8 kg, SpO2 (!) 80 %.Body mass index is 28.12 kg/m.  Psychiatric Specialty Exam: Physical Exam  ROS  Blood pressure 106/74, pulse (!) 110, temperature 97.6 F (36.4 C), temperature source Oral, resp. rate 14, height 6\' 4"  (1.93 m), weight 104.8 kg, SpO2 (!) 80 %.Body mass index is 28.12 kg/m.  General Appearance:Disheveled  Eye Contact:Poor  Speech:Garbled, Slow and Slurred  Volume:Decreased  Mood:not stated  Affect:lethargic  Thought Process:Disorganized  Orientation:Other:person, place  Thought Content:Unable to assess  Suicidal Thoughts:unable to assess  Homicidal Thoughts:Unable to assess  Memory:impaired  Judgement:Impaired  Insight:impaired  Psychomotor Activity:Retardation/lethargy  Concentration:poor   Recall:unable to assess, likely impaired  Fund of Knowledge:Poor  Language:Poor  Akathisia:NA  Handed:Right  AIMS (if indicated):   Assets:Social Support  ADL's:Impaired  Cognition:Impaired,Severe  Sleep:Number of Hours: 2.75           Has this patient used any form of tobacco in the last 30 days? (Cigarettes, Smokeless Tobacco, Cigars, and/or Pipes) Yes, Yes, Prescription not provided because: the patient was discharged to the medical floor  Blood Alcohol level:  Lab Results  Component Value Date   [facility] <10 03/22/2018   ETH <11 04/27/2012    Metabolic Disorder Labs:  Lab Results  Component Value Date   HGBA1C 5.9 11/01/2015   No results found for: PROLACTIN Lab Results  Component Value Date   CHOL 191 03/18/2016   TRIG 363.0 (H) 03/18/2016   HDL 35.60 (L) 03/18/2016   CHOLHDL 5 03/18/2016   VLDL 72.6 (H) 03/18/2016   LDLCALC 84 11/01/2015   LDLCALC 102 (H) 09/28/2010    See Psychiatric Specialty Exam and Suicide Risk Assessment completed by Attending Physician prior to discharge.  Discharge destination:  Other:  Medical bed  Is patient on multiple antipsychotic therapies at discharge:  No   Has Patient had three or more failed trials of antipsychotic monotherapy by history:  No  Recommended Plan for Multiple Antipsychotic Therapies: NA    Follow-up Information    Dr. Milagros Evener. Go on 05/13/2018.   Why:  Please follow up with Dr. Evelene Croon on Wednesday, May 13, 2018 at 945am. Contact information: [address] [place] [address] Ph: [phone]          Comments:  Will continue to follow on psychiatric consultation service  Signed: Luciano Cutter, DO 03/28/2018, 9:38 AM

## 2018-03-28 NOTE — Progress Notes (Signed)
Pts mother called back to the BMU for a update on pt, she was made aware that pt was transferred to 2A room 250. Jeffrey Lin is the pts mother.

## 2018-03-28 NOTE — Progress Notes (Signed)
Patient ID: Jeffrey Lin, male   DOB: 1975-10-01, 43 y.o.   MRN: [id number]   Pts O2 sats remained in the 80's on room air, once at the bedside it was observed that his lips were blue. Per Dr. Katrinka Blazing who was also at bedside, a rapid response needed to be called. A rapid response was called at 9am, pt was also placed on 2 liter of O2. Pt vitals sign were rechecked: BP 96/77, Resp 16, HR 106, and O2 sats 96% on 2l. Rapid at bedside, orders noted to transfer pt to medical floor. Report called to Nurse [facility].

## 2018-03-28 NOTE — Progress Notes (Signed)
   03/28/18 1000  Clinical Encounter Type  Visited With Patient  Visit Type Initial;Spiritual support  Referral From Nurse  Spiritual Encounters  Spiritual Needs Prayer

## 2018-03-28 NOTE — Progress Notes (Signed)
Patient continued on 1;1  Monitoring for safety no distress noted.

## 2018-03-28 NOTE — Progress Notes (Signed)
Rapid response called, pt on 2lpm [place], sats 92%, respiratory rate 16/min, ABG drawn and reported to CCU charge nurse.

## 2018-03-28 NOTE — Progress Notes (Signed)
Patient is on 1:1 care to  Prevent fall   And gate remain unstable, gets around on wheel chair with assist, prescribed medications are given, no distress , speech is mumbled and not coherent, patient is safe and monitored.

## 2018-03-28 NOTE — Progress Notes (Signed)
Patient is in bed restfully and on !:1 for safety and fall prevention noted.

## 2018-03-28 NOTE — Plan of Care (Signed)
Problem: Education: Goal: Utilization of techniques to improve thought processes will improve 03/28/2018 0413 by Lelan Pons, RN Outcome: Progressing 03/28/2018 0409 by Lelan Pons, RN Outcome: Progressing Goal: Knowledge of the prescribed therapeutic regimen will improve 03/28/2018 0413 by Lelan Pons, RN Outcome: Progressing 03/28/2018 0409 by Lelan Pons, RN Outcome: Progressing   Problem: Activity: Goal: Interest or engagement in leisure activities will improve 03/28/2018 0413 by Lelan Pons, RN Outcome: Progressing 03/28/2018 0409 by Lelan Pons, RN Outcome: Progressing Goal: Imbalance in normal sleep/wake cycle will improve 03/28/2018 0413 by Lelan Pons, RN Outcome: Progressing 03/28/2018 0409 by Lelan Pons, RN Outcome: Progressing   Problem: Coping: Goal: Coping ability will improve 03/28/2018 0413 by Lelan Pons, RN Outcome: Progressing 03/28/2018 0409 by Lelan Pons, RN Outcome: Progressing Goal: Will verbalize feelings 03/28/2018 0413 by Lelan Pons, RN Outcome: Progressing 03/28/2018 0409 by Lelan Pons, RN Outcome: Progressing   Problem: Health Behavior/Discharge Planning: Goal: Ability to make decisions will improve 03/28/2018 0413 by Lelan Pons, RN Outcome: Progressing 03/28/2018 0409 by Lelan Pons, RN Outcome: Progressing Goal: Compliance with therapeutic regimen will improve 03/28/2018 0413 by Lelan Pons, RN Outcome: Progressing 03/28/2018 0409 by Lelan Pons, RN Outcome: Progressing   Problem: Role Relationship: Goal: Will demonstrate positive changes in social behaviors and relationships 03/28/2018 0413 by Lelan Pons, RN Outcome: Progressing 03/28/2018 0409 by Lelan Pons, RN Outcome: Progressing   Problem: Safety: Goal: Ability to disclose and discuss suicidal ideas will improve 03/28/2018 0413 by Lelan Pons, RN Outcome:  Progressing 03/28/2018 0409 by Lelan Pons, RN Outcome: Progressing Goal: Ability to identify and utilize support systems that promote safety will improve 03/28/2018 0413 by Lelan Pons, RN Outcome: Progressing 03/28/2018 0409 by Lelan Pons, RN Outcome: Progressing   Problem: Self-Concept: Goal: Will verbalize positive feelings about self 03/28/2018 0413 by Lelan Pons, RN Outcome: Progressing 03/28/2018 0409 by Lelan Pons, RN Outcome: Progressing Goal: Level of anxiety will decrease 03/28/2018 0413 by Lelan Pons, RN Outcome: Progressing 03/28/2018 0409 by Lelan Pons, RN Outcome: Progressing   Problem: Education: Goal: Knowledge of [facility] Education information/materials will improve 03/28/2018 0413 by Lelan Pons, RN Outcome: Progressing 03/28/2018 0409 by Lelan Pons, RN Outcome: Progressing Goal: Emotional status will improve 03/28/2018 0413 by Lelan Pons, RN Outcome: Progressing 03/28/2018 0409 by Lelan Pons, RN Outcome: Progressing Goal: Mental status will improve 03/28/2018 0413 by Lelan Pons, RN Outcome: Progressing 03/28/2018 0409 by Lelan Pons, RN Outcome: Progressing Goal: Verbalization of understanding the information provided will improve 03/28/2018 0413 by Lelan Pons, RN Outcome: Progressing 03/28/2018 0409 by Lelan Pons, RN Outcome: Progressing   Problem: Activity: Goal: Interest or engagement in activities will improve 03/28/2018 0413 by Lelan Pons, RN Outcome: Progressing 03/28/2018 0409 by Lelan Pons, RN Outcome: Progressing Goal: Sleeping patterns will improve 03/28/2018 0413 by Lelan Pons, RN Outcome: Progressing 03/28/2018 0409 by Lelan Pons, RN Outcome: Progressing   Problem: Coping: Goal: Ability to verbalize frustrations and anger appropriately will improve 03/28/2018 0413 by Lelan Pons, RN Outcome:  Progressing 03/28/2018 0409 by Lelan Pons, RN Outcome: Progressing Goal: Ability to demonstrate self-control will improve 03/28/2018 0413 by Lelan Pons, RN Outcome: Progressing 03/28/2018 0409 by Lelan Pons, RN Outcome: Progressing   Problem: Health Behavior/Discharge Planning: Goal: Identification of resources available to assist in meeting health care needs will improve 03/28/2018 0413 by Lelan Pons, RN Outcome: Progressing 03/28/2018 0409 by Lelan Pons, RN Outcome: Progressing Goal: Compliance with treatment plan for underlying cause of condition will improve 03/28/2018 0413 by Lelan Pons, RN Outcome: Progressing 03/28/2018  4008 by Lelan Pons, RN Outcome: Progressing   Problem: Physical Regulation: Goal: Ability to maintain clinical measurements within normal limits will improve 03/28/2018 0413 by Lelan Pons, RN Outcome: Progressing 03/28/2018 0409 by Lelan Pons, RN Outcome: Progressing   Problem: Safety: Goal: Periods of time without injury will increase 03/28/2018 0413 by Lelan Pons, RN Outcome: Progressing 03/28/2018 0409 by Lelan Pons, RN Outcome: Progressing

## 2018-03-29 ENCOUNTER — Other Ambulatory Visit: Payer: Self-pay | Admitting: Family Medicine

## 2018-03-29 LAB — CBC
HCT: 43.1 % (ref 39.0–52.0)
Hemoglobin: 13.8 g/dL (ref 13.0–17.0)
MCH: 28 pg (ref 26.0–34.0)
MCHC: 32 g/dL (ref 30.0–36.0)
MCV: 87.6 fL (ref 80.0–100.0)
Platelets: 235 10*3/uL (ref 150–400)
RBC: 4.92 MIL/uL (ref 4.22–5.81)
RDW: 13.8 % (ref 11.5–15.5)
WBC: 9.6 10*3/uL (ref 4.0–10.5)
nRBC: 0 % (ref 0.0–0.2)

## 2018-03-29 LAB — BASIC METABOLIC PANEL
Anion gap: 10 (ref 5–15)
BUN: 17 mg/dL (ref 6–20)
CO2: 35 mmol/L — AB (ref 22–32)
Calcium: 9.2 mg/dL (ref 8.9–10.3)
Chloride: 92 mmol/L — ABNORMAL LOW (ref 98–111)
Creatinine, Ser: 1.02 mg/dL (ref 0.61–1.24)
GFR calc Af Amer: >60 mL/min (ref >60)
GFR calc non Af Amer: >60 mL/min (ref >60)
Glucose, Bld: 100 mg/dL — ABNORMAL HIGH (ref 70–99)
Potassium: 3.6 mmol/L (ref 3.5–5.1)
Sodium: 137 mmol/L (ref 135–145)

## 2018-03-29 MED ORDER — LORAZEPAM 1 MG PO TABS
1.0000 mg | ORAL_TABLET | Freq: Four times a day (QID) | ORAL | Status: AC | PRN
  Administered 2018-03-29: 1 mg via ORAL
  Filled 2018-03-29: qty 1

## 2018-03-29 MED ORDER — SODIUM CHLORIDE 0.9 % IV SOLN
INTRAVENOUS | Status: DC | PRN
  Administered 2018-03-29: 500 mL via INTRAVENOUS

## 2018-03-29 MED ORDER — LORAZEPAM 2 MG/ML IJ SOLN
1.0000 mg | Freq: Four times a day (QID) | INTRAMUSCULAR | Status: AC | PRN
  Administered 2018-04-01 (×2): 1 mg via INTRAVENOUS
  Filled 2018-03-29 (×3): qty 1

## 2018-03-29 MED ORDER — RISPERIDONE 0.5 MG PO TBDP
0.5000 mg | ORAL_TABLET | Freq: Two times a day (BID) | ORAL | Status: DC
  Administered 2018-03-29: 0.5 mg via ORAL
  Administered 2018-03-29: 22:00:00 via ORAL
  Administered 2018-03-30: 0.5 mg via ORAL
  Filled 2018-03-29 (×4): qty 0.5

## 2018-03-29 NOTE — Plan of Care (Signed)
  Problem: Safety: Goal: Ability to remain free from injury will improve Outcome: Progressing   

## 2018-03-29 NOTE — Progress Notes (Signed)
[facility] - [place] [facility]   PATIENT NAME: Jeffrey Lin    MR#:  [id number]  DATE OF BIRTH:  [birth year]  SUBJECTIVE:  CHIEF COMPLAINT:  No chief complaint on file.  Awake and alert today Continues to have hallucinations.  Mother at bedside.  Afebrile.  Saturations 85% on room air.  Needing 2 to 3 L oxygen.  REVIEW OF SYSTEMS:    Review of Systems  Unable to perform ROS: Mental status change    DRUG ALLERGIES:   Allergies  Allergen Reactions  . Toradol [Ketorolac Tromethamine] Swelling  . Lamotrigine Rash    VITALS:  Blood pressure (!) 132/91, pulse 100, temperature 98.5 F (36.9 C), temperature source Oral, resp. rate 16, height 6\' 1"  (1.854 m), weight 104.2 kg, SpO2 93 %.  PHYSICAL EXAMINATION:   Physical Exam  GENERAL:  43 y.o.-year-old patient lying in the bed with no acute distress.  EYES: Pupils equal, round, reactive to light and accommodation. No scleral icterus. Extraocular muscles intact.  HEENT: Head atraumatic, normocephalic. Oropharynx and nasopharynx clear.  NECK:  Supple, no jugular venous distention. No thyroid enlargement, no tenderness.  LUNGS: Bibasilar rhonchi CARDIOVASCULAR: S1, S2 normal. No murmurs, rubs, or gallops.  ABDOMEN: Soft, nontender, nondistended. Bowel sounds present. No organomegaly or mass.  EXTREMITIES: No cyanosis, clubbing or edema b/l.    NEUROLOGIC: Cranial nerves II through XII are intact. No focal Motor or sensory deficits b/l.   PSYCHIATRIC: The patient is alert and awake.  Confused SKIN: No obvious rash, lesion, or ulcer.   LABORATORY PANEL:   CBC Recent Labs  Lab 03/29/18 0508  WBC 9.6  HGB 13.8  HCT 43.1  PLT 235   ------------------------------------------------------------------------------------------------------------------ Chemistries  Recent Labs  Lab 03/28/18 0627 03/29/18 0508  NA 141 137  K 3.6 3.6  CL 94* 92*  CO2 35* 35*  GLUCOSE 122* 100*  BUN 24* 17  CREATININE  1.28* 1.02  CALCIUM 8.8* 9.2  AST 25  --   ALT 24  --   ALKPHOS 109  --   BILITOT 0.8  --    ------------------------------------------------------------------------------------------------------------------  Cardiac Enzymes Recent Labs  Lab 03/28/18 0931  TROPONINI <0.03   ------------------------------------------------------------------------------------------------------------------  RADIOLOGY:  Ct Angio Chest Pe W Or Wo Contrast  Result Date: 03/28/2018 CLINICAL DATA:  Shortness of breath. EXAM: CT ANGIOGRAPHY CHEST WITH CONTRAST TECHNIQUE: Multidetector CT imaging of the chest was performed using the standard protocol during bolus administration of intravenous contrast. Multiplanar CT image reconstructions and MIPs were obtained to evaluate the vascular anatomy. CONTRAST:  36mL OMNIPAQUE IOHEXOL 350 MG/ML SOLN COMPARISON:  None. FINDINGS: Cardiovascular:  Heart size is normal.  Aortic arch is unremarkable. Pulmonary artery opacification is excellent. No focal filling defects are present to suggest pulmonary emboli. Pulmonary arteries are of normal size. Mediastinum/Nodes: No significant mediastinal, hilar, or axillary adenopathy is present. The esophagus is normal. Thoracic inlet is within normal limits. Lungs/Pleura: Bilateral posterior lower lobe airspace disease is present. There is some volume loss in both lung bases. No other focal nodule, mass, or upper lobe airspace disease is present. No significant pleural or pericardial effusion is present. There is no pneumothorax. Upper Abdomen: The visualized solid organs are within normal limits. Musculoskeletal: Vertebral body heights and alignment are maintained. No focal lytic or blastic lesions are present. Review of the MIP images confirms the above findings. IMPRESSION: 1. No pulmonary embolus. 2. Bilateral lower lobe airspace disease, concerning for pneumonia or aspiration. Electronically Signed   By: Cristal Deer  Mattern M.D.   On:  03/28/2018 15:40   Dg Chest Port 1 View  Result Date: 03/28/2018 CLINICAL DATA:  Hypoxia. Smoker. EXAM: PORTABLE CHEST 1 VIEW COMPARISON:  03/24/2018. FINDINGS: Poor inspiration with crowding of the lung markings. The cardiac silhouette is near the upper limit of normal in size. Small to moderate-sized left pleural effusion with a mild increase in amount. Mild increase in patchy and linear density in the left lower lobe. Interval mild atelectasis at the right lung base. Unremarkable bones. IMPRESSION: 1. Poor inspiration with mild right basilar atelectasis. 2. Mildly increased left lower lobe atelectasis or pneumonia. 3. Mild increase in size of a small to moderate-sized left pleural effusion. Electronically Signed   By: Beckie Salts M.D.   On: 03/28/2018 10:09     ASSESSMENT AND PLAN:   *Bibasilar pneumonia with acute hypoxic respiratory failure and sepsis On IV Levaquin. Cx pending Sepsis resolved Continue oxygen  * Acute metabolic encephalopathy Due to medications IMproving  *Schizoaffective disorder with depression Hold sedating medications.  Consulted psychiatry. Discussed with Dr. Katrinka Blazing yesterday  DVT prophylaxis with Lovenox  All the records are reviewed and case discussed with Care Management/Social Workerr. Management plans discussed with the patient, family and they are in agreement.  CODE STATUS: Full code  DVT Prophylaxis: SCDs  TOTAL TIME TAKING CARE OF THIS PATIENT: 30 minutes.   POSSIBLE D/C IN 1-2 DAYS, DEPENDING ON CLINICAL CONDITION.  Molinda Bailiff Kerney Hopfensperger M.D on 03/29/2018 at 11:56 AM  Between 7am to 6pm - Pager - [phone]  After 6pm go to [email or web address] - password [email or web address] [facility]  [facility] [place] [facility]  Office  [phone]  CC: Primary care physician; Excell Seltzer, MD  Note: This dictation was prepared with Dragon dictation along with smaller phrase technology. Any transcriptional errors that result from this process are unintentional.

## 2018-03-29 NOTE — Progress Notes (Signed)
Advance care planning  Purpose of Encounter Pneumonia, acute hypoxic respiratory failure  Parties in Attendance Patient, mother at bedside  Patients Decisional capacity Patient with hallucinations and confusion.  Unable to make decisions.  Mother is the healthcare power of attorney and making decisions at this time.  Discussed with her at bedside.  We discussed regarding patient's pneumonia, acute hypoxic respiratory failure.  Explained oxygen is likely temporarily needed with his pneumonia and would be weaned off as he improves.  Mother very concerned  She also explains that his hallucinations are similar to when he arrived and would like this addressed during the hospital stay.  Explained that patient will be transferred to behavioral health unit once he is improved from the pneumonia.  All questions answered.  CODE STATUS full code.  Requesting aggressive full treatment at this time  Time spent - 17 minutes

## 2018-03-29 NOTE — Consult Note (Addendum)
[facility] Face-to-Face Psychiatry Consult   Reason for Consult: Follow-up from transfer from the behavioral health unit Referring Physician: Dr. Elpidio Anis Patient Identification: Jeffrey Lin MRN:  [id number] Principal Diagnosis: Delirium Diagnosis:  Active Problems:   Pneumonia   Total Time spent with patient: 1 hour  Subjective:   Jeffrey Lin is a 43 y.o. male patient admitted with pneumonia, acute hypoxic respiratory failure, COPD, delirium, history of schizoaffective disorder, and history of polysubstance use disorders.  He was transferred to the medical floor after being found hypoxic early on Saturday morning.  HPI: Per staff the patient has remained confused but is more alert than previously, his family have been present at the bedside.  They were present during the course of his follow-up interview.  They had several questions regarding his course of care and underlying pathology, a significant amount of his interview was spent educating/informing the patient's family of his treatment during the past few days.   On interview [name] continues to be significantly confused, he appear to be responding to internal stimuli of the event visual hallucinations consistent with his prior delirious presentation, he did not appear to be distressed.  He spoke in a slurred soft voice.  He was only oriented to himself and the hospital.  His RASS score was 0.  Cognitively he was able to name 2 four-legged animals in 60 seconds., He did not demonstrate immediate recall when given 3 words, he failed vision A's.  Considering the extent of his confusion and difficulty understanding instruction further cognitive examination was not continued as he was becoming distressed  Past Psychiatric History: Diagnosed with schizoaffective disorder, extensive medication history, history of ECT in 2017.  He sees Dr. Helane Rima in [place].  He has a history of suicidal ideation without any attempts  Past Medical History:   Past Medical History:  Diagnosis Date  . Anxiety   . Chronic leg pain   . Depression   . Essential hypertension, benign 10/20/2015  . Schizoaffective disorder [facility])     Past Surgical History:  Procedure Laterality Date  . Block procedure at pain center  03/27/06  . Bone graft from hip to jaw  2004  . Bone graft right side of head to jaw  2006  . 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 Sphenopalatine ganglionic     Family History:  Family History  Problem Relation Age of Onset  . Hyperlipidemia Mother   . Hypertension Mother   . Depression Mother   . Anxiety disorder Mother   . Depression Brother   . Anxiety disorder Brother   . Bipolar disorder Maternal Grandfather   . Schizophrenia Maternal Grandfather   . Bipolar disorder Paternal Grandfather    Family Psychiatric  History: Unknown Social History:  Social History   Substance and Sexual Activity  Alcohol Use No  . Alcohol/week: 0.0 standard drinks     Social History   Substance and Sexual Activity  Drug Use No    Social History   Socioeconomic History  . Marital status: Single    Spouse name: Not on file  . Number of children: 0  . Years of education: 66  . Highest education level: Not on file  Occupational History  . Occupation: Marketing executive    Employer: [employer]  Social Needs  . Financial resource strain: Not on file  . Food insecurity:    Worry: Not on file    Inability: Not on file  .  Transportation needs:    Medical: Not on file    Non-medical: Not on file  Tobacco Use  . Smoking status: Current Every Day Smoker    Packs/day: 1.00    Years: 23.00    Pack years: 23.00    Types: Cigarettes    Start date: 09/22/1995  . Smokeless tobacco: Former Engineer, waterUser  Substance and Sexual Activity  . Alcohol use: No    Alcohol/week: 0.0 standard drinks  . Drug use: No  . Sexual activity: Not Currently  Lifestyle  . Physical activity:    Days per week: Not on file    Minutes  per session: Not on file  . Stress: Not on file  Relationships  . Social connections:    Talks on phone: Not on file    Gets together: Not on file    Attends religious service: Not on file    Active member of club or organization: Not on file    Attends meetings of clubs or organizations: Not on file    Relationship status: Not on file  Other Topics Concern  . Not on file  Social History Narrative      Exercise:  None, running causes head pain   Diet:  FF once daily, + fruits and veggies, + H2O, + Soda   Lives with mom in a one story home.  No children.  Currently not working.  Trying to get disability.  Education: college.    Additional Social History:    Allergies:   Allergies  Allergen Reactions  . Toradol [Ketorolac Tromethamine] Swelling  . Lamotrigine Rash    Labs:  Results for orders placed or performed during the hospital encounter of 03/28/18 (from the past 48 hour(s))  Culture, blood (routine x 2) Call MD if unable to obtain prior to antibiotics being given     Status: None (Preliminary result)   Collection Time: 03/28/18  1:53 PM  Result Value Ref Range   Specimen Description BLOOD LEFT ANTECUBITAL    Special Requests      BOTTLES DRAWN AEROBIC AND ANAEROBIC Blood Culture adequate volume   Culture      NO GROWTH < 24 HOURS Performed at [facility], [address] [place], [place] [address]    Report Status PENDING   Culture, blood (routine x 2) Call MD if unable to obtain prior to antibiotics being given     Status: None (Preliminary result)   Collection Time: 03/28/18  1:53 PM  Result Value Ref Range   Specimen Description BLOOD RIGHT ANTECUBITAL    Special Requests      BOTTLES DRAWN AEROBIC AND ANAEROBIC Blood Culture adequate volume   Culture      NO GROWTH < 24 HOURS Performed at [facility], [address] [place], [place] [address]    Report Status PENDING   Basic metabolic panel     Status: Abnormal   Collection Time:  03/29/18  5:08 AM  Result Value Ref Range   Sodium 137 135 - 145 mmol/L   Potassium 3.6 3.5 - 5.1 mmol/L   Chloride 92 (L) 98 - 111 mmol/L   CO2 35 (H) 22 - 32 mmol/L   Glucose, Bld 100 (H) 70 - 99 mg/dL   BUN 17 6 - 20 mg/dL   Creatinine, Ser 2.951.02 0.61 - 1.24 mg/dL   Calcium 9.2 8.9 - 62.110.3 mg/dL   GFR calc non Af Amer >60 >60 mL/min   GFR calc Af Amer >60 >60 mL/min  Anion gap 10 5 - 15    Comment: Performed at [facility], [address] [place], [place] [address]  CBC     Status: None   Collection Time: 03/29/18  5:08 AM  Result Value Ref Range   WBC 9.6 4.0 - 10.5 K/uL   RBC 4.92 4.22 - 5.81 MIL/uL   Hemoglobin 13.8 13.0 - 17.0 g/dL   HCT 60.4 54.0 - 98.1 %   MCV 87.6 80.0 - 100.0 fL   MCH 28.0 26.0 - 34.0 pg   MCHC 32.0 30.0 - 36.0 g/dL   RDW 19.1 47.8 - 29.5 %   Platelets 235 150 - 400 K/uL   nRBC 0.0 0.0 - 0.2 %    Comment: Performed at [facility], [address] [place], [place] [address]    Current Facility-Administered Medications  Medication Dose Route Frequency Provider Last Rate Last Dose  . 0.9 %  sodium chloride infusion  500 mL Intravenous Once Clapacs, John T, MD      . 0.9 %  sodium chloride infusion   Intravenous PRN Milagros Loll, MD 10 mL/hr at 03/29/18 1346 500 mL at 03/29/18 1346  . acetaminophen (TYLENOL) tablet 650 mg  650 mg Oral Q6H PRN Milagros Loll, MD       Or  . acetaminophen (TYLENOL) suppository 650 mg  650 mg Rectal Q6H PRN Sudini, Srikar, MD      . albuterol (PROVENTIL) (2.5 MG/3ML) 0.083% nebulizer solution 2.5 mg  2.5 mg Nebulization Q2H PRN Sudini, Srikar, MD      . enoxaparin (LOVENOX) injection 40 mg  40 mg Subcutaneous Q24H Milagros Loll, MD   40 mg at 03/28/18 2051  . levofloxacin (LEVAQUIN) IVPB 750 mg  750 mg Intravenous Q24H Sudini, Wardell Heath, MD 100 mL/hr at 03/29/18 1347 750 mg at 03/29/18 1347  . LORazepam (ATIVAN) tablet 1 mg  1 mg Oral Q6H PRN Luciano Cutter, DO       Or  . LORazepam (ATIVAN)  injection 1 mg  1 mg Intravenous Q6H PRN Donata Clay R, DO      . ondansetron (ZOFRAN) tablet 4 mg  4 mg Oral Q6H PRN Milagros Loll, MD       Or  . ondansetron (ZOFRAN) injection 4 mg  4 mg Intravenous Q6H PRN Sudini, Srikar, MD      . polyethylene glycol (MIRALAX / GLYCOLAX) packet 17 g  17 g Oral Daily PRN Sudini, Wardell Heath, MD      . risperiDONE (RISPERDAL M-TABS) disintegrating tablet 0.5 mg  0.5 mg Oral BID Donata Clay R, DO   0.5 mg at 03/29/18 1600    Musculoskeletal: Strength & Muscle Tone: decreased Gait & Station: unable to stand Patient leans: N/A  Psychiatric Specialty Exam: Physical Exam  Constitutional: He appears well-developed and well-nourished.  HENT:  Head: Normocephalic and atraumatic.  Respiratory: Effort normal.  Neurological: He is alert.    Review of Systems  Unable to perform ROS: Psychiatric disorder    Blood pressure 106/75, pulse (!) 118, temperature 98.6 F (37 C), temperature source Oral, resp. rate 16, height  (1.854 m), weight 104.2 kg, SpO2 93 %.Body mass index is 30.31 kg/m.  General Appearance: Disheveled  Eye Contact:  Fair  Speech:  Garbled  Volume:  Decreased  Mood:  "Great thank you"  Affect:  Blunted, confused  Thought Process:  Disorganized  Orientation:  Other:  To self and place  Thought Content:  Hallucinations: Visual  Suicidal Thoughts:  No  Homicidal Thoughts:  No  Memory:  Immediate;   Poor  Judgement:  Poor  Insight:  Lacking  Psychomotor Activity:  Decreased  Concentration:  Concentration: Poor  Recall:  Poor  Fund of Knowledge:  Poor  Language:  Fair  Akathisia:  No  Handed:  Right  AIMS (if indicated):     Assets:  Housing Social Support  ADL's:  Impaired  Cognition:  Impaired,  Moderate secondary to his delirium  Sleep:   Not recorded     Treatment Plan Summary: Daily contact with patient to assess and evaluate symptoms and progress in treatment, Medication management and Plan As follows   The  patient is considerably more alert compared to yesterday, he may need some time in order to clear up.  We will start 0.5 mg of the Risperdal p.o. twice daily in order to assist with his delirium.  This should be less sedating.  We will place him on a CIWA, with as needed Lorazepam for potential benzodiazepine withdrawal.  Would hold off on doing a scheduled taper considering his recent lethargy, and ongoing delirium.  His family were at bedside and were briefed on his current psychiatric picture and ongoing plan.   Disposition: No evidence of imminent risk to self or others at present.   Recommend psychiatric Inpatient admission when medically cleared.  Luciano Cutter, DO 03/29/2018 4:34 PM

## 2018-03-30 DIAGNOSIS — F250 Schizoaffective disorder, bipolar type: Secondary | ICD-10-CM | POA: Diagnosis not present

## 2018-03-30 DIAGNOSIS — J189 Pneumonia, unspecified organism: Secondary | ICD-10-CM | POA: Diagnosis not present

## 2018-03-30 DIAGNOSIS — R410 Disorientation, unspecified: Secondary | ICD-10-CM | POA: Diagnosis not present

## 2018-03-30 DIAGNOSIS — J9601 Acute respiratory failure with hypoxia: Secondary | ICD-10-CM | POA: Diagnosis not present

## 2018-03-30 MED ORDER — RISPERIDONE 1 MG PO TBDP
1.0000 mg | ORAL_TABLET | Freq: Every day | ORAL | Status: DC
  Administered 2018-03-31: 1 mg via ORAL
  Filled 2018-03-30 (×2): qty 1

## 2018-03-30 MED ORDER — PREDNISONE 50 MG PO TABS
50.0000 mg | ORAL_TABLET | Freq: Every day | ORAL | Status: DC
  Administered 2018-03-30 – 2018-04-03 (×5): 50 mg via ORAL
  Filled 2018-03-30 (×5): qty 1

## 2018-03-30 NOTE — Progress Notes (Signed)
[facility] - [place] [facility]   PATIENT NAME: Jeffrey Lin    MR#:  [id number]  DATE OF BIRTH:  [birth year]  SUBJECTIVE:  CHIEF COMPLAINT:  No chief complaint on file.  Awake and alert. Confused  REVIEW OF SYSTEMS:    Review of Systems  Unable to perform ROS: Mental status change   DRUG ALLERGIES:   Allergies  Allergen Reactions  . Toradol [Ketorolac Tromethamine] Swelling  . Lamotrigine Rash   VITALS:  Blood pressure 133/76, pulse 95, temperature 98 F (36.7 C), temperature source Oral, resp. rate 20, height 6\' 1"  (1.854 m), weight 105 kg, SpO2 90 %.  PHYSICAL EXAMINATION:   Physical Exam  GENERAL:  43 y.o.-year-old patient lying in the bed with no acute distress.  EYES: Pupils equal, round, reactive to light and accommodation. No scleral icterus. Extraocular muscles intact.  HEENT: Head atraumatic, normocephalic. Oropharynx and nasopharynx clear.  NECK:  Supple, no jugular venous distention. No thyroid enlargement, no tenderness.  LUNGS: Bibasilar rhonchi CARDIOVASCULAR: S1, S2 normal. No murmurs, rubs, or gallops.  ABDOMEN: Soft, nontender, nondistended. Bowel sounds present. No organomegaly or mass.  EXTREMITIES: No cyanosis, clubbing or edema b/l.    NEUROLOGIC: Cranial nerves II through XII are intact. No focal Motor or sensory deficits b/l.   PSYCHIATRIC: The patient is alert and awake.  Confused SKIN: No obvious rash, lesion, or ulcer.   LABORATORY PANEL:   CBC Recent Labs  Lab 03/29/18 0508  WBC 9.6  HGB 13.8  HCT 43.1  PLT 235   ------------------------------------------------------------------------------------------------------------------ Chemistries  Recent Labs  Lab 03/28/18 0627 03/29/18 0508  NA 141 137  K 3.6 3.6  CL 94* 92*  CO2 35* 35*  GLUCOSE 122* 100*  BUN 24* 17  CREATININE 1.28* 1.02  CALCIUM 8.8* 9.2  AST 25  --   ALT 24  --   ALKPHOS 109  --   BILITOT 0.8  --     ------------------------------------------------------------------------------------------------------------------  Cardiac Enzymes Recent Labs  Lab 03/28/18 0931  TROPONINI <0.03  ----------------------------------------------------------------------------------------------------------------- RADIOLOGY:  Ct Angio Chest Pe W Or Wo Contrast  Result Date: 03/28/2018 CLINICAL DATA:  Shortness of breath. EXAM: CT ANGIOGRAPHY CHEST WITH CONTRAST TECHNIQUE: Multidetector CT imaging of the chest was performed using the standard protocol during bolus administration of intravenous contrast. Multiplanar CT image reconstructions and MIPs were obtained to evaluate the vascular anatomy. CONTRAST:  6mL OMNIPAQUE IOHEXOL 350 MG/ML SOLN COMPARISON:  None. FINDINGS: Cardiovascular:  Heart size is normal.  Aortic arch is unremarkable. Pulmonary artery opacification is excellent. No focal filling defects are present to suggest pulmonary emboli. Pulmonary arteries are of normal size. Mediastinum/Nodes: No significant mediastinal, hilar, or axillary adenopathy is present. The esophagus is normal. Thoracic inlet is within normal limits. Lungs/Pleura: Bilateral posterior lower lobe airspace disease is present. There is some volume loss in both lung bases. No other focal nodule, mass, or upper lobe airspace disease is present. No significant pleural or pericardial effusion is present. There is no pneumothorax. Upper Abdomen: The visualized solid organs are within normal limits. Musculoskeletal: Vertebral body heights and alignment are maintained. No focal lytic or blastic lesions are present. Review of the MIP images confirms the above findings. IMPRESSION: 1. No pulmonary embolus. 2. Bilateral lower lobe airspace disease, concerning for pneumonia or aspiration. Electronically Signed   By: Marin Roberts M.D.   On: 03/28/2018 15:40   ASSESSMENT AND PLAN:   *Bibasilar pneumonia with acute hypoxic respiratory failure  and sepsis On IV Levaquin.  Cx Negative Sepsis resolved Continue oxygen  * Acute metabolic encephalopathy Due to medications Improving  *Schizoaffective disorder with depression Hold sedating medications.  Consulted psychiatry. Discussed with Dr. Katrinka Blazing on admission Started on Risperidone  DVT prophylaxis with Lovenox  All the records are reviewed and case discussed with Care Management/Social Workerr. Management plans discussed with the patient, family and they are in agreement.  CODE STATUS: Full code  DVT Prophylaxis: SCDs  TOTAL TIME TAKING CARE OF THIS PATIENT: 30 minutes.   POSSIBLE D/C IN 1-2 DAYS, DEPENDING ON CLINICAL CONDITION.  Molinda Bailiff Zoii Florer M.D on 03/30/2018 at 12:56 PM  Between 7am to 6pm - Pager - [phone]  After 6pm go to [email or web address] - password [email or web address] [facility]  [facility] [place] [facility]  Office  [phone]  CC: Primary care physician; Excell Seltzer, MD  Note: This dictation was prepared with Dragon dictation along with smaller phrase technology. Any transcriptional errors that result from this process are unintentional.

## 2018-03-30 NOTE — Progress Notes (Signed)
Bladder scan showing 320 mls of urine as patient still has not voided on his own. MD notified.

## 2018-03-30 NOTE — Consult Note (Signed)
[facility] Face-to-Face Psychiatry Consult   Reason for Consult: Follow-up from transfer from the behavioral health unit Referring Physician: Dr. Elpidio Anis Patient Identification: Jeffrey Lin MRN:  [id number] Principal Diagnosis: Delirium Diagnosis:  Active Problems:   Pneumonia   Total Time spent with patient: 15 minutes  Subjective:   Jeffrey Lin is a 43 y.o. male patient admitted with pneumonia, acute hypoxic respiratory failure, COPD, delirium, history of schizoaffective disorder, and history of polysubstance use disorders.  He was transferred to the medical floor after being found hypoxic early on Saturday morning.  HPI: Per staff the patient has remained confused and somnolent.   Patient is seen with aunt and cousin at bedside.   Patient does not arouse to voice, and minimally to stimulation. Sitter at bedside reports that patient has had fluctuation in level of consciousness, and was agitated overnight.  She reports he has been sleeping and calm for the past hour.  Past Psychiatric History: Diagnosed with schizoaffective disorder, extensive medication history, history of ECT in 2017.  He sees Dr. Helane Rima in [place].  He has a history of suicidal ideation without any attempts  Past Medical History:  Past Medical History:  Diagnosis Date  . Anxiety   . Chronic leg pain   . Depression   . Essential hypertension, benign 10/20/2015  . Schizoaffective disorder [facility])     Past Surgical History:  Procedure Laterality Date  . Block procedure at pain center  03/27/06  . Bone graft from hip to jaw  2004  . Bone graft right side of head to jaw  2006  . 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 Sphenopalatine ganglionic     Family History:  Family History  Problem Relation Age of Onset  . Hyperlipidemia Mother   . Hypertension Mother   . Depression Mother   . Anxiety disorder Mother   . Depression Brother   . Anxiety disorder Brother   . Bipolar disorder Maternal Grandfather    . Schizophrenia Maternal Grandfather   . Bipolar disorder Paternal Grandfather    Family Psychiatric  History: Unknown Social History:  Social History   Substance and Sexual Activity  Alcohol Use No  . Alcohol/week: 0.0 standard drinks     Social History   Substance and Sexual Activity  Drug Use No    Social History   Socioeconomic History  . Marital status: Single    Spouse name: Not on file  . Number of children: 0  . Years of education: 67  . Highest education level: Not on file  Occupational History  . Occupation: Marketing executive    Employer: [employer]  Social Needs  . Financial resource strain: Not on file  . Food insecurity:    Worry: Not on file    Inability: Not on file  . Transportation needs:    Medical: Not on file    Non-medical: Not on file  Tobacco Use  . Smoking status: Current Every Day Smoker    Packs/day: 1.00    Years: 23.00    Pack years: 23.00    Types: Cigarettes    Start date: 09/22/1995  . Smokeless tobacco: Former Engineer, water and Sexual Activity  . Alcohol use: No    Alcohol/week: 0.0 standard drinks  . Drug use: No  . Sexual activity: Not Currently  Lifestyle  . Physical activity:    Days per week: Not on file    Minutes per session: Not on  file  . Stress: Not on file  Relationships  . Social connections:    Talks on phone: Not on file    Gets together: Not on file    Attends religious service: Not on file    Active member of club or organization: Not on file    Attends meetings of clubs or organizations: Not on file    Relationship status: Not on file  Other Topics Concern  . Not on file  Social History Narrative      Exercise:  None, running causes head pain   Diet:  FF once daily, + fruits and veggies, + H2O, + Soda   Lives with mom in a one story home.  No children.  Currently not working.  Trying to get disability.  Education: college.    Additional Social History:    Allergies:    Allergies  Allergen Reactions  . Toradol [Ketorolac Tromethamine] Swelling  . Lamotrigine Rash    Labs:  Results for orders placed or performed during the hospital encounter of 03/28/18 (from the past 48 hour(s))  Culture, blood (routine x 2) Call MD if unable to obtain prior to antibiotics being given     Status: None (Preliminary result)   Collection Time: 03/28/18  1:53 PM  Result Value Ref Range   Specimen Description BLOOD LEFT ANTECUBITAL    Special Requests      BOTTLES DRAWN AEROBIC AND ANAEROBIC Blood Culture adequate volume   Culture      NO GROWTH 2 DAYS Performed at [facility], [address], [place], [place] [address]    Report Status PENDING   Culture, blood (routine x 2) Call MD if unable to obtain prior to antibiotics being given     Status: None (Preliminary result)   Collection Time: 03/28/18  1:53 PM  Result Value Ref Range   Specimen Description BLOOD RIGHT ANTECUBITAL    Special Requests      BOTTLES DRAWN AEROBIC AND ANAEROBIC Blood Culture adequate volume   Culture      NO GROWTH 2 DAYS Performed at [facility], [address] [place], [place] [address]    Report Status PENDING   Basic metabolic panel     Status: Abnormal   Collection Time: 03/29/18  5:08 AM  Result Value Ref Range   Sodium 137 135 - 145 mmol/L   Potassium 3.6 3.5 - 5.1 mmol/L   Chloride 92 (L) 98 - 111 mmol/L   CO2 35 (H) 22 - 32 mmol/L   Glucose, Bld 100 (H) 70 - 99 mg/dL   BUN 17 6 - 20 mg/dL   Creatinine, Ser 0.98 0.61 - 1.24 mg/dL   Calcium 9.2 8.9 - 11.9 mg/dL   GFR calc non Af Amer >60 >60 mL/min   GFR calc Af Amer >60 >60 mL/min   Anion gap 10 5 - 15    Comment: Performed at [facility], [address] [place], [place] [address]  CBC     Status: None   Collection Time: 03/29/18  5:08 AM  Result Value Ref Range   WBC 9.6 4.0 - 10.5 K/uL   RBC 4.92 4.22 - 5.81 MIL/uL   Hemoglobin 13.8 13.0 - 17.0 g/dL   HCT 95.6 21.3 - 08.6 %   MCV  87.6 80.0 - 100.0 fL   MCH 28.0 26.0 - 34.0 pg   MCHC 32.0 30.0 - 36.0 g/dL   RDW 57.8 46.9 - 62.9 %   Platelets 235  150 - 400 K/uL   nRBC 0.0 0.0 - 0.2 %    Comment: Performed at [facility], [address] [place], [place] [address]    Current Facility-Administered Medications  Medication Dose Route Frequency Provider Last Rate Last Dose  . 0.9 %  sodium chloride infusion  500 mL Intravenous Once Clapacs, John T, MD      . 0.9 %  sodium chloride infusion   Intravenous PRN Milagros LollSudini, Srikar, MD 10 mL/hr at 03/29/18 1346 500 mL at 03/29/18 1346  . acetaminophen (TYLENOL) tablet 650 mg  650 mg Oral Q6H PRN Milagros LollSudini, Srikar, MD       Or  . acetaminophen (TYLENOL) suppository 650 mg  650 mg Rectal Q6H PRN Sudini, Srikar, MD      . albuterol (PROVENTIL) (2.5 MG/3ML) 0.083% nebulizer solution 2.5 mg  2.5 mg Nebulization Q2H PRN Sudini, Srikar, MD      . enoxaparin (LOVENOX) injection 40 mg  40 mg Subcutaneous Q24H Milagros LollSudini, Srikar, MD   40 mg at 03/29/18 2229  . levofloxacin (LEVAQUIN) IVPB 750 mg  750 mg Intravenous Q24H Sudini, Wardell HeathSrikar, MD 100 mL/hr at 03/29/18 1347 750 mg at 03/29/18 1347  . LORazepam (ATIVAN) tablet 1 mg  1 mg Oral Q6H PRN Donata ClaySmith, Justin R, DO   1 mg at 03/29/18 2228   Or  . LORazepam (ATIVAN) injection 1 mg  1 mg Intravenous Q6H PRN Donata ClaySmith, Justin R, DO      . ondansetron (ZOFRAN) tablet 4 mg  4 mg Oral Q6H PRN Milagros LollSudini, Srikar, MD       Or  . ondansetron (ZOFRAN) injection 4 mg  4 mg Intravenous Q6H PRN Sudini, Srikar, MD      . polyethylene glycol (MIRALAX / GLYCOLAX) packet 17 g  17 g Oral Daily PRN Sudini, Srikar, MD      . risperiDONE (RISPERDAL M-TABS) disintegrating tablet 0.5 mg  0.5 mg Oral BID Luciano CutterSmith, Justin R, DO        Musculoskeletal: Strength & Muscle Tone: decreased Gait & Station: unable to stand Patient leans: N/A  Psychiatric Specialty Exam: Physical Exam  Constitutional: He appears well-developed and well-nourished.  HENT:  Head: Normocephalic  and atraumatic.  Respiratory: Effort normal.  Neurological: He is alert.    Review of Systems  Unable to perform ROS: Psychiatric disorder    Blood pressure 108/85, pulse (!) 108, temperature 98 F (36.7 C), temperature source Oral, resp. rate 20, height 6\' 1"  (1.854 m), weight 105 kg, SpO2 90 %.Body mass index is 30.54 kg/m.  General Appearance: Disheveled  Eye Contact:  None  Speech:  Garbled  Volume:  Decreased  Mood:  NA  Affect:  sedated  Thought Process:  Disorganized and NA  Orientation:  Other:  To self and place when last awake  Thought Content:  Hallucinations: Visual  when last awake  Suicidal Thoughts:  No  when last awake  Homicidal Thoughts:  No  when last awake  Memory:  NA  Judgement:  NA  Insight:  NA  Psychomotor Activity:  Decreased  Concentration:  Concentration: NA   Recall:  NA  Fund of Knowledge:  NA  Language:  NA  Akathisia:  No  Handed:  Right  AIMS (if indicated):     Assets:  Housing Social Support  ADL's:  Impaired  Cognition:  Impaired,  Moderate secondary to his delirium  Sleep:   Not recorded     Treatment Plan Summary: Daily contact with patient to assess  and evaluate symptoms and progress in treatment, Medication management and Plan As follows   The patient is sedated. Change Risperdal 1 mg p.o. HS to assist with his delirium, improve night time sedation and minimize daytime sedation. Will continue to monitor.  Continue CIWA, with as needed Lorazepam for potential benzodiazepine withdrawal.  Would hold off on doing a scheduled taper considering his recent lethargy, and ongoing delirium.   His family at bedside and were briefed on his current psychiatric picture and ongoing plan.   Disposition: No evidence of imminent risk to self or others at present.   Recommend psychiatric Inpatient admission when medically cleared.  Mariel Craft, MD 03/30/2018 9:46 AM

## 2018-03-30 NOTE — Progress Notes (Signed)
Pt resting comfortably, pt is septic d/t pneumonia he has had no acute changes today

## 2018-03-31 ENCOUNTER — Inpatient Hospital Stay: Payer: PPO

## 2018-03-31 DIAGNOSIS — F250 Schizoaffective disorder, bipolar type: Secondary | ICD-10-CM

## 2018-03-31 DIAGNOSIS — J9601 Acute respiratory failure with hypoxia: Secondary | ICD-10-CM

## 2018-03-31 LAB — HIV ANTIBODY (ROUTINE TESTING W REFLEX): HIV Screen 4th Generation wRfx: NONREACTIVE

## 2018-03-31 MED ORDER — METOPROLOL SUCCINATE ER 50 MG PO TB24
50.0000 mg | ORAL_TABLET | Freq: Every day | ORAL | Status: DC
  Administered 2018-03-31 – 2018-04-08 (×9): 50 mg via ORAL
  Filled 2018-03-31 (×9): qty 1

## 2018-03-31 MED ORDER — TAMSULOSIN HCL 0.4 MG PO CAPS
0.4000 mg | ORAL_CAPSULE | Freq: Every day | ORAL | Status: DC
  Administered 2018-03-31 – 2018-04-08 (×9): 0.4 mg via ORAL
  Filled 2018-03-31 (×9): qty 1

## 2018-03-31 MED ORDER — FUROSEMIDE 40 MG PO TABS
40.0000 mg | ORAL_TABLET | Freq: Once | ORAL | Status: AC
  Administered 2018-03-31: 40 mg via ORAL
  Filled 2018-03-31: qty 1

## 2018-03-31 MED ORDER — LEVOFLOXACIN 750 MG PO TABS
750.0000 mg | ORAL_TABLET | Freq: Every day | ORAL | Status: AC
  Administered 2018-04-01 – 2018-04-03 (×3): 750 mg via ORAL
  Filled 2018-03-31 (×3): qty 1

## 2018-03-31 NOTE — Progress Notes (Signed)
[facility] [place] [facility]   PATIENT NAME: Jeffrey Lin    MR#:  [id number]  DATE OF BIRTH:  05-04-1975  SUBJECTIVE:  CHIEF COMPLAINT:  No chief complaint on file.  Awake and alert. Confused  Mother at bedside  REVIEW OF SYSTEMS:    Review of Systems  Unable to perform ROS: Mental status change   DRUG ALLERGIES:   Allergies  Allergen Reactions  . Toradol [Ketorolac Tromethamine] Swelling  . Lamotrigine Rash   VITALS:  Blood pressure 121/90, pulse (!) 112, temperature 98.4 F (36.9 C), temperature source Oral, resp. rate 18, height 6\' 1"  (1.854 m), weight 101.1 kg, SpO2 93 %.  PHYSICAL EXAMINATION:   Physical Exam  GENERAL:  43 y.o.-year-old patient lying in the bed with no acute distress.  EYES: Pupils equal, round, reactive to light and accommodation. No scleral icterus. Extraocular muscles intact.  HEENT: Head atraumatic, normocephalic. Oropharynx and nasopharynx clear.  NECK:  Supple, no jugular venous distention. No thyroid enlargement, no tenderness.  LUNGS: Bibasilar rhonchi CARDIOVASCULAR: S1, S2 normal. No murmurs, rubs, or gallops.  ABDOMEN: Soft, nontender, nondistended. Bowel sounds present. No organomegaly or mass.  EXTREMITIES: No cyanosis, clubbing or edema b/l.    NEUROLOGIC: Cranial nerves II through XII are intact. No focal Motor or sensory deficits b/l.   PSYCHIATRIC: The patient is alert and awake.  Confused SKIN: No obvious rash, lesion, or ulcer.   LABORATORY PANEL:   CBC Recent Labs  Lab 03/29/18 0508  WBC 9.6  HGB 13.8  HCT 43.1  PLT 235   ------------------------------------------------------------------------------------------------------------------ Chemistries  Recent Labs  Lab 03/28/18 0627 03/29/18 0508  NA 141 137  K 3.6 3.6  CL 94* 92*  CO2 35* 35*  GLUCOSE 122* 100*  BUN 24* 17  CREATININE 1.28* 1.02  CALCIUM 8.8* 9.2  AST 25  --   ALT 24  --   ALKPHOS 109  --   BILITOT 0.8  --     ------------------------------------------------------------------------------------------------------------------  Cardiac Enzymes Recent Labs  Lab 03/28/18 0931  TROPONINI <0.03  ----------------------------------------------------------------------------------------------------------------- RADIOLOGY:  No results found. ASSESSMENT AND PLAN:   *Bibasilar pneumonia with acute hypoxic respiratory failure and sepsis On IV Levaquin. Cx Negative Sepsis resolved Continue oxygen. Still needing 2 L O2  Repeat CXR  * Acute metabolic encephalopathy Due to medications Resolved  *Schizoaffective disorder with depression Hold sedating medications.  Consulted psychiatry. Started on Risperidone  DVT prophylaxis with Lovenox  All the records are reviewed and case discussed with Care Management/Social Workerr. Management plans discussed with the patient, family and they are in agreement.  CODE STATUS: Full code  DVT Prophylaxis: SCDs  TOTAL TIME TAKING CARE OF THIS PATIENT: 30 minutes.   POSSIBLE D/C IN 1-2 DAYS, DEPENDING ON CLINICAL CONDITION.  Molinda Bailiff Kayani Rapaport M.D on 03/31/2018 at 1:37 PM  Between 7am to 6pm - Pager - [phone]  After 6pm go to [email or web address] - password [email or web address] [facility]  [facility] [place] [facility]  Office  [phone]  CC: Primary care physician; Excell Seltzer, MD  Note: This dictation was prepared with Dragon dictation along with smaller phrase technology. Any transcriptional errors that result from this process are unintentional.

## 2018-03-31 NOTE — Consult Note (Signed)
[facility] Face-to-Face Psychiatry Consult   Reason for Consult: Follow-up from transfer from the behavioral health unit Referring Physician: Dr. Elpidio Anis Patient Identification: Jeffrey Lin MRN:  [id number] Principal Diagnosis: Delirium Diagnosis:  Active Problems:   Pneumonia   Total Time spent with patient: 35 min.  Subjective:   Jeffrey Lin is a 43 y.o. male patient admitted with pneumonia, acute hypoxic respiratory failure, COPD, delirium, history of schizoaffective disorder, and history of polysubstance use disorders.  He was transferred to the medical floor after being found hypoxic early on Saturday morning.  HPI: Per staff the patient slept well last night and has been more awake this morning, however remains confused. Patient had trial of oxygen weaning to 1 liter via [place], however he had desaturation to 85% and oxygen was resumed to 2 liters [place] with saturation able to maintain > 90%. Patient makes minimal eye contact, and is slow in response.  He only states yes and no ma'am and OK. He appears internally pre-occupied. He denies SI, HI.  Past Psychiatric History: Diagnosed with schizoaffective disorder, extensive medication history, history of ECT in 2017.  He sees Dr. Helane Rima in [place].  He has a history of suicidal ideation without any attempts  Past Medical History:  Past Medical History:  Diagnosis Date  . Anxiety   . Chronic leg pain   . Depression   . Essential hypertension, benign 10/20/2015  . Schizoaffective disorder [facility])     Past Surgical History:  Procedure Laterality Date  . Block procedure at pain center  03/27/06  . Bone graft from hip to jaw  2004  . Bone graft right side of head to jaw  2006  . 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 Sphenopalatine ganglionic     Family History:  Family History  Problem Relation Age of Onset  . Hyperlipidemia Mother   . Hypertension Mother   . Depression Mother   . Anxiety disorder Mother   . Depression  Brother   . Anxiety disorder Brother   . Bipolar disorder Maternal Grandfather   . Schizophrenia Maternal Grandfather   . Bipolar disorder Paternal Grandfather    Family Psychiatric  History: Unknown Social History:  Social History   Substance and Sexual Activity  Alcohol Use No  . Alcohol/week: 0.0 standard drinks     Social History   Substance and Sexual Activity  Drug Use No    Social History   Socioeconomic History  . Marital status: Single    Spouse name: Not on file  . Number of children: 0  . Years of education: 86  . Highest education level: Not on file  Occupational History  . Occupation: Marketing executive    Employer: [employer]  Social Needs  . Financial resource strain: Not on file  . Food insecurity:    Worry: Not on file    Inability: Not on file  . Transportation needs:    Medical: Not on file    Non-medical: Not on file  Tobacco Use  . Smoking status: Current Every Day Smoker    Packs/day: 1.00    Years: 23.00    Pack years: 23.00    Types: Cigarettes    Start date: 09/22/1995  . Smokeless tobacco: Former Engineer, water and Sexual Activity  . Alcohol use: No    Alcohol/week: 0.0 standard drinks  . Drug use: No  . Sexual activity: Not Currently  Lifestyle  . Physical activity:  Days per week: Not on file    Minutes per session: Not on file  . Stress: Not on file  Relationships  . Social connections:    Talks on phone: Not on file    Gets together: Not on file    Attends religious service: Not on file    Active member of club or organization: Not on file    Attends meetings of clubs or organizations: Not on file    Relationship status: Not on file  Other Topics Concern  . Not on file  Social History Narrative      Exercise:  None, running causes head pain   Diet:  FF once daily, + fruits and veggies, + H2O, + Soda   Lives with mom in a one story home.  No children.  Currently not working.  Trying to get  disability.  Education: college.    Additional Social History:    Allergies:   Allergies  Allergen Reactions  . Toradol [Ketorolac Tromethamine] Swelling  . Lamotrigine Rash    Labs:  No results found for this or any previous visit (from the past 48 hour(s)).  Current Facility-Administered Medications  Medication Dose Route Frequency Provider Last Rate Last Dose  . 0.9 %  sodium chloride infusion  500 mL Intravenous Once Clapacs, John T, MD      . 0.9 %  sodium chloride infusion   Intravenous PRN Milagros Loll, MD 10 mL/hr at 03/29/18 1346 500 mL at 03/29/18 1346  . acetaminophen (TYLENOL) tablet 650 mg  650 mg Oral Q6H PRN Milagros Loll, MD   650 mg at 03/30/18 1314   Or  . acetaminophen (TYLENOL) suppository 650 mg  650 mg Rectal Q6H PRN Sudini, Wardell Heath, MD      . albuterol (PROVENTIL) (2.5 MG/3ML) 0.083% nebulizer solution 2.5 mg  2.5 mg Nebulization Q2H PRN Sudini, Srikar, MD      . enoxaparin (LOVENOX) injection 40 mg  40 mg Subcutaneous Q24H Milagros Loll, MD   40 mg at 03/30/18 2200  . levofloxacin (LEVAQUIN) IVPB 750 mg  750 mg Intravenous Q24H Sudini, Wardell Heath, MD 100 mL/hr at 03/30/18 1408 750 mg at 03/30/18 1408  . LORazepam (ATIVAN) tablet 1 mg  1 mg Oral Q6H PRN Donata Clay R, DO   1 mg at 03/29/18 2228   Or  . LORazepam (ATIVAN) injection 1 mg  1 mg Intravenous Q6H PRN Donata Clay R, DO      . ondansetron (ZOFRAN) tablet 4 mg  4 mg Oral Q6H PRN Milagros Loll, MD       Or  . ondansetron (ZOFRAN) injection 4 mg  4 mg Intravenous Q6H PRN Sudini, Srikar, MD      . polyethylene glycol (MIRALAX / GLYCOLAX) packet 17 g  17 g Oral Daily PRN Sudini, Srikar, MD      . predniSONE (DELTASONE) tablet 50 mg  50 mg Oral Q breakfast Milagros Loll, MD   50 mg at 03/31/18 0928  . risperiDONE (RISPERDAL M-TABS) disintegrating tablet 1 mg  1 mg Oral QHS Mariel Craft, MD        Musculoskeletal: Strength & Muscle Tone: decreased Gait & Station: unable to stand Patient leans:  N/A  Psychiatric Specialty Exam: Physical Exam  Nursing note and vitals reviewed. Constitutional: He appears well-developed and well-nourished.  HENT:  Head: Normocephalic and atraumatic.  Cardiovascular:  Tachycardia  Respiratory: Effort normal.  Neurological:  sedated    Review of Systems  Unable to perform ROS: Psychiatric  disorder    Blood pressure 121/90, pulse (!) 112, temperature 98.4 F (36.9 C), temperature source Oral, resp. rate 18, height 6\' 1"  (1.854 m), weight 101.1 kg, SpO2 92 %.Body mass index is 29.41 kg/m.  General Appearance: Disheveled  Eye Contact:  Minimal  Speech:  Garbled  Volume:  Decreased  Mood:  NA  Affect:  Blunt  Thought Process:  Disorganized  Orientation:  Other:  To self  Thought Content:  Hallucinations: Visual   Suicidal Thoughts:  No   Homicidal Thoughts:  No   Memory:  poor  Judgement:  Impaired  Insight:  Lacking  Psychomotor Activity:  Decreased  Concentration:  Concentration: Poor   Recall:  Poor  Fund of Knowledge:  NA  Language:  NA  Akathisia:  No  Handed:  Right  AIMS (if indicated):     Assets:  Housing Social Support  ADL's:  Impaired  Cognition:  Impaired,  Moderate secondary to his delirium  Sleep:   Not recorded     Treatment Plan Summary: Daily contact with patient to assess and evaluate symptoms and progress in treatment, Medication management and Plan As follows   Continue Risperdal 1 mg p.o. HS to assist with his delirium, improve night time sedation and minimize daytime sedation. Will continue to monitor.  Continue CIWA, with as needed Lorazepam for potential benzodiazepine withdrawal.  Would hold off on doing a scheduled taper considering his recent lethargy, and ongoing delirium.    Disposition: No evidence of imminent risk to self or others at present.   Recommend psychiatric Inpatient admission when medically cleared. and delirium cleared in order that patient can be interactive in milieu, group therapy  and inpatient psychiatric treatment programming.  Patient is unable to maintain adequate oxygenation saturation without supplemental oxygen.     Mariel Craft, MD 03/31/2018 9:39 AM

## 2018-03-31 NOTE — Plan of Care (Signed)
  Problem: Nutrition: Goal: Adequate nutrition will be maintained Outcome: Progressing   

## 2018-03-31 NOTE — Care Management Important Message (Signed)
Copy of signed Medicare IM left with patient in room. 

## 2018-04-01 ENCOUNTER — Ambulatory Visit: Payer: Self-pay

## 2018-04-01 LAB — CREATININE, SERUM
Creatinine, Ser: 0.88 mg/dL (ref 0.61–1.24)
GFR calc Af Amer: >60 mL/min (ref >60)
GFR calc non Af Amer: >60 mL/min (ref >60)

## 2018-04-01 MED ORDER — RISPERIDONE 1 MG PO TABS
1.0000 mg | ORAL_TABLET | Freq: Once | ORAL | Status: AC
  Administered 2018-04-01: 1 mg via ORAL
  Filled 2018-04-01: qty 1

## 2018-04-01 MED ORDER — RISPERIDONE 1 MG PO TABS
2.0000 mg | ORAL_TABLET | Freq: Every day | ORAL | Status: DC
  Administered 2018-04-01 – 2018-04-02 (×2): 2 mg via ORAL
  Filled 2018-04-01 (×4): qty 2

## 2018-04-01 MED ORDER — HALOPERIDOL LACTATE 5 MG/ML IJ SOLN
2.0000 mg | Freq: Once | INTRAMUSCULAR | Status: AC
  Administered 2018-04-01: 2 mg via INTRAVENOUS
  Filled 2018-04-01: qty 1

## 2018-04-01 MED ORDER — LORAZEPAM 1 MG PO TABS
1.0000 mg | ORAL_TABLET | Freq: Four times a day (QID) | ORAL | Status: DC | PRN
  Administered 2018-04-02 (×3): 1 mg via ORAL
  Filled 2018-04-01 (×3): qty 1

## 2018-04-01 MED ORDER — LORAZEPAM 2 MG/ML IJ SOLN
1.0000 mg | Freq: Four times a day (QID) | INTRAMUSCULAR | Status: DC | PRN
  Administered 2018-04-01: 1 mg via INTRAVENOUS

## 2018-04-01 NOTE — Progress Notes (Signed)
Talked to Dr. Elpidio Anis about patient's increase anxiety, sitter say that patient is pulling his tele lead. Per MD's note to hold sedatives. Patient is receiving Ativan here PRN, ok to reorder medication. RN will continue to monitor.

## 2018-04-01 NOTE — Progress Notes (Signed)
Notified Dr. Sheryle Hail of increased agitation and anxiety. Gave 1mg  IV Ativan which had no effect on pnt. Notified MD again due to need for additional medication to decrease agitation and anxiety. Gave Haldol per ordered. Pnt had much better relief with haldol.   Overnight pnt was oriented to name only. Most of speech was incomprehensible.

## 2018-04-01 NOTE — Progress Notes (Signed)
Pt very agitated, crying, hearing voices, feels like he is in danger. Ativan 1mg  iv given per md orders.  WIll monitor patient.

## 2018-04-01 NOTE — Plan of Care (Signed)
  Problem: Education: Goal: Knowledge of General Education information will improve Description Including pain rating scale, medication(s)/side effects and non-pharmacologic comfort measures Outcome: Progressing   Problem: Health Behavior/Discharge Planning: Goal: Ability to manage health-related needs will improve Outcome: Progressing   Problem: Clinical Measurements: Goal: Ability to maintain clinical measurements within normal limits will improve Outcome: Progressing Goal: Will remain free from infection Outcome: Progressing Note:  Remains afebrile, continues on IV antibiotics Goal: Diagnostic test results will improve Outcome: Progressing Goal: Respiratory complications will improve Outcome: Progressing Goal: Cardiovascular complication will be avoided Outcome: Progressing   Problem: Activity: Goal: Risk for activity intolerance will decrease Outcome: Progressing

## 2018-04-01 NOTE — Progress Notes (Signed)
[facility] - [place] [facility]   PATIENT NAME: Jeffrey Lin    MR#:  [id number]  DATE OF BIRTH:  December 26, 1975  SUBJECTIVE:  CHIEF COMPLAINT:  No chief complaint on file.  Awake but confused. Mother at bedside.  Afebrile Still has O2  REVIEW OF SYSTEMS:    Review of Systems  Unable to perform ROS: Mental status change   DRUG ALLERGIES:   Allergies  Allergen Reactions  . Toradol [Ketorolac Tromethamine] Swelling  . Lamotrigine Rash   VITALS:  Blood pressure 125/87, pulse 95, temperature 99.2 F (37.3 C), temperature source Oral, resp. rate 18, height 6\' 1"  (1.854 m), weight 100.1 kg, SpO2 91 %.  PHYSICAL EXAMINATION:   Physical Exam  GENERAL:  43 y.o.-year-old patient lying in the bed with no acute distress.  EYES: Pupils equal, round, reactive to light and accommodation. No scleral icterus. Extraocular muscles intact.  HEENT: Head atraumatic, normocephalic. Oropharynx and nasopharynx clear.  NECK:  Supple, no jugular venous distention. No thyroid enlargement, no tenderness.  LUNGS: Clear to auscultation bilaterally CARDIOVASCULAR: S1, S2 normal. No murmurs, rubs, or gallops.  ABDOMEN: Soft, nontender, nondistended. Bowel sounds present. No organomegaly or mass.  EXTREMITIES: No cyanosis, clubbing or edema b/l.    NEUROLOGIC: Cranial nerves II through XII are intact. No focal Motor or sensory deficits b/l.   PSYCHIATRIC: The patient is alert and awake.  Confused SKIN: No obvious rash, lesion, or ulcer.   LABORATORY PANEL:   CBC Recent Labs  Lab 03/29/18 0508  WBC 9.6  HGB 13.8  HCT 43.1  PLT 235   ------------------------------------------------------------------------------------------------------------------ Chemistries  Recent Labs  Lab 03/28/18 0627 03/29/18 0508 04/01/18 0358  NA 141 137  --   K 3.6 3.6  --   CL 94* 92*  --   CO2 35* 35*  --   GLUCOSE 122* 100*  --   BUN 24* 17  --   CREATININE 1.28* 1.02 0.88  CALCIUM  8.8* 9.2  --   AST 25  --   --   ALT 24  --   --   ALKPHOS 109  --   --   BILITOT 0.8  --   --    ------------------------------------------------------------------------------------------------------------------  Cardiac Enzymes Recent Labs  Lab 03/28/18 0931  TROPONINI <0.03  ----------------------------------------------------------------------------------------------------------------- RADIOLOGY:  Dg Chest 2 View  Result Date: 03/31/2018 CLINICAL DATA:  Hypoxia EXAM: CHEST - 2 VIEW COMPARISON:  March 28, 2018 FINDINGS: No pneumothorax. Left-sided pleural effusion with underlying opacity. Mild atelectasis in the right base. The cardiomediastinal silhouette is stable. No other interval changes. IMPRESSION: No change in the left-sided pleural effusion with underlying opacity with right basilar atelectasis. Electronically Signed   By: Gerome Sam III M.D   On: 03/31/2018 13:42   ASSESSMENT AND PLAN:   *Bibasilar pneumonia with acute hypoxic respiratory failure and sepsis On Levaquin Cx Negative Sepsis resolved Continue oxygen. Still needing 2 L O2. Via chest x-ray shows infiltrate.  * Acute metabolic encephalopathy Due to medications Resolved  *Schizoaffective disorder with depression Sedative medications held.   Consulted psychiatry.  Following Started on Risperidone  DVT prophylaxis with Lovenox  All the records are reviewed and case discussed with Care Management/Social Worker Management plans discussed with the patient, family and they are in agreement.  CODE STATUS: Full code   DVT Prophylaxis: SCDs  TOTAL TIME TAKING CARE OF THIS PATIENT: 30 minutes.   POSSIBLE D/C IN 1-2 DAYS, DEPENDING ON CLINICAL CONDITION.  Sruthi Maurer R Sissi Padia M.D  on 04/01/2018 at 2:17 PM  Between 7am to 6pm - Pager - [phone]  After 6pm go to [email or web address] - password [email or web address] [facility]  [facility]  Office  [phone]  CC: Primary care physician; Excell Seltzer,  MD  Note: This dictation was prepared with Dragon dictation along with smaller phrase technology. Any transcriptional errors that result from this process are unintentional.

## 2018-04-01 NOTE — Consult Note (Signed)
[facility] Face-to-Face Psychiatry Consult   Reason for Consult: Follow-up from transfer from the behavioral health unit Referring Physician: Dr. Elpidio Anis Patient Identification: Jeffrey Lin MRN:  [id number] Principal Diagnosis: Delirium Diagnosis:  Active Problems:   Pneumonia   Total Time spent with patient: 35 min.  Subjective:   Jeffrey Lin is a 43 y.o. male patient admitted with pneumonia, acute hypoxic respiratory failure, COPD, delirium, history of schizoaffective disorder, and history of polysubstance use disorders.  He was transferred to the medical floor after being found hypoxic early on Saturday morning.  HPI: Per staff the patient slept well last night and has been more awake today.  They have noted him to be having hallucinations all through the day, as well as episodes of agitation. Today, patient is awake and alert.  He is oriented to time with prompting.  He is fully conversant, however still some confused.  He complains of having auditory and visual hallucinations that are disturbing to him.  He appears internally pre-occupied. He denies SI, HI.  Past Psychiatric History: Diagnosed with schizoaffective disorder, extensive medication history, history of ECT in 2017.  He sees Dr. Helane Rima in [place].  He has a history of suicidal ideation without any attempts  Past Medical History:  Past Medical History:  Diagnosis Date  . Anxiety   . Chronic leg pain   . Depression   . Essential hypertension, benign 10/20/2015  . Schizoaffective disorder [facility])     Past Surgical History:  Procedure Laterality Date  . Block procedure at pain center  03/27/06  . Bone graft from hip to jaw  2004  . Bone graft right side of head to jaw  2006  . 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 Sphenopalatine ganglionic     Family History:  Family History  Problem Relation Age of Onset  . Hyperlipidemia Mother   . Hypertension Mother   . Depression Mother   . Anxiety disorder Mother   .  Depression Brother   . Anxiety disorder Brother   . Bipolar disorder Maternal Grandfather   . Schizophrenia Maternal Grandfather   . Bipolar disorder Paternal Grandfather    Family Psychiatric  History: Unknown Social History:  Social History   Substance and Sexual Activity  Alcohol Use No  . Alcohol/week: 0.0 standard drinks     Social History   Substance and Sexual Activity  Drug Use No    Social History   Socioeconomic History  . Marital status: Single    Spouse name: Not on file  . Number of children: 0  . Years of education: 45  . Highest education level: Not on file  Occupational History  . Occupation: Marketing executive    Employer: [employer]  Social Needs  . Financial resource strain: Not on file  . Food insecurity:    Worry: Not on file    Inability: Not on file  . Transportation needs:    Medical: Not on file    Non-medical: Not on file  Tobacco Use  . Smoking status: Current Every Day Smoker    Packs/day: 1.00    Years: 23.00    Pack years: 23.00    Types: Cigarettes    Start date: 09/22/1995  . Smokeless tobacco: Former Engineer, water and Sexual Activity  . Alcohol use: No    Alcohol/week: 0.0 standard drinks  . Drug use: No  . Sexual activity: Not Currently  Lifestyle  . Physical activity:  Days per week: Not on file    Minutes per session: Not on file  . Stress: Not on file  Relationships  . Social connections:    Talks on phone: Not on file    Gets together: Not on file    Attends religious service: Not on file    Active member of club or organization: Not on file    Attends meetings of clubs or organizations: Not on file    Relationship status: Not on file  Other Topics Concern  . Not on file  Social History Narrative      Exercise:  None, running causes head pain   Diet:  FF once daily, + fruits and veggies, + H2O, + Soda   Lives with mom in a one story home.  No children.  Currently not working.  Trying to  get disability.  Education: college.    Additional Social History:    Allergies:   Allergies  Allergen Reactions  . Toradol [Ketorolac Tromethamine] Swelling  . Lamotrigine Rash    Labs:  Results for orders placed or performed during the hospital encounter of 03/28/18 (from the past 48 hour(s))  Creatinine, serum     Status: None   Collection Time: 04/01/18  3:58 AM  Result Value Ref Range   Creatinine, Ser 0.88 0.61 - 1.24 mg/dL   GFR calc non Af Amer >60 >60 mL/min   GFR calc Af Amer >60 >60 mL/min    Comment: Performed at [facility], [address], [place], [place] [address]    Current Facility-Administered Medications  Medication Dose Route Frequency Provider Last Rate Last Dose  . acetaminophen (TYLENOL) tablet 650 mg  650 mg Oral Q6H PRN Milagros Loll, MD   650 mg at 03/30/18 1308   Or  . acetaminophen (TYLENOL) suppository 650 mg  650 mg Rectal Q6H PRN Sudini, Wardell Heath, MD      . albuterol (PROVENTIL) (2.5 MG/3ML) 0.083% nebulizer solution 2.5 mg  2.5 mg Nebulization Q2H PRN Sudini, Srikar, MD      . enoxaparin (LOVENOX) injection 40 mg  40 mg Subcutaneous Q24H Milagros Loll, MD   40 mg at 03/31/18 2255  . levofloxacin (LEVAQUIN) tablet 750 mg  750 mg Oral Daily Milagros Loll, MD   750 mg at 04/01/18 1528  . LORazepam (ATIVAN) tablet 1 mg  1 mg Oral Q6H PRN Milagros Loll, MD       Or  . LORazepam (ATIVAN) injection 1 mg  1 mg Intravenous Q6H PRN Milagros Loll, MD   1 mg at 04/01/18 1822  . metoprolol succinate (TOPROL-XL) 24 hr tablet 50 mg  50 mg Oral Daily Milagros Loll, MD   50 mg at 04/01/18 0910  . ondansetron (ZOFRAN) tablet 4 mg  4 mg Oral Q6H PRN Milagros Loll, MD       Or  . ondansetron (ZOFRAN) injection 4 mg  4 mg Intravenous Q6H PRN Sudini, Srikar, MD      . polyethylene glycol (MIRALAX / GLYCOLAX) packet 17 g  17 g Oral Daily PRN Sudini, Srikar, MD      . predniSONE (DELTASONE) tablet 50 mg  50 mg Oral Q breakfast Milagros Loll, MD   50 mg  at 04/01/18 0909  . risperiDONE (RISPERDAL) tablet 2 mg  2 mg Oral QHS Mariel Craft, MD      . tamsulosin [facility]) capsule 0.4 mg  0.4 mg Oral Daily Milagros Loll, MD   0.4 mg at 04/01/18 [phone]  Musculoskeletal: Strength & Muscle Tone: decreased Gait & Station: unable to stand Patient leans: N/A  Psychiatric Specialty Exam: Physical Exam  Nursing note and vitals reviewed. Constitutional: He is oriented to person, place, and time. He appears well-developed and well-nourished. No distress.  HENT:  Head: Normocephalic and atraumatic.  Neck: Normal range of motion.  Cardiovascular: Regular rhythm.  Respiratory: Effort normal.  Musculoskeletal: Normal range of motion.  Neurological: He is alert and oriented to person, place, and time.  Skin: Skin is warm and dry.    Review of Systems  Constitutional: Negative.   HENT: Negative.   Respiratory: Negative.   Cardiovascular: Negative.   Gastrointestinal: Negative.   Neurological: Negative.   Psychiatric/Behavioral: Positive for hallucinations and memory loss. Negative for depression, substance abuse and suicidal ideas. The patient is nervous/anxious and has insomnia.     Blood pressure 118/86, pulse 99, temperature 98.5 F (36.9 C), temperature source Oral, resp. rate 20, height 6\' 1"  (1.854 m), weight 100.1 kg, SpO2 93 %.Body mass index is 29.12 kg/m.  General Appearance: Disheveled  Eye Contact:  Good  Speech:  Clear and Coherent and Normal Rate  Volume:  Decreased  Mood:  Anxious and Dysphoric  Affect:  Blunt  Thought Process:  Coherent  Orientation:  Other:  To self and place  Thought Content:  Hallucinations: Auditory Visual   Suicidal Thoughts:  No   Homicidal Thoughts:  No   Memory:  poor  Judgement:  Impaired  Insight:  Lacking  Psychomotor Activity:  Decreased  Concentration:  Concentration: Poor   Recall:  Poor  Fund of Knowledge:  Fair  Language:  Fair  Akathisia:  No  Handed:  Right  AIMS (if  indicated):     Assets:  Housing Social Support  ADL's:  Impaired  Cognition:  Impaired,  Mild secondary to his delirium, improving  Sleep:   Not recorded     Treatment Plan Summary: Daily contact with patient to assess and evaluate symptoms and progress in treatment, Medication management and Plan As follows   Give additional Risperdal 1 mg p.o. today to assist with hallucinations Increase Risperdal to 2 mg p.o. HS to assist with his delirium, improve night time sedation and minimize daytime sedation. Will continue to monitor.  Continue CIWA, with as needed Lorazepam for potential benzodiazepine withdrawal.  Would hold off on doing a scheduled taper considering his recent lethargy, and ongoing delirium.   If patient continues to not need benzodiazepine coverage would recommend discontinuation of CIWA.  Disposition: No evidence of imminent risk to self or others at present.   Recommend psychiatric Inpatient admission when medically cleared. and delirium cleared in order that patient can be interactive in milieu, group therapy and inpatient psychiatric treatment programming.  Patient is unable to maintain adequate oxygenation saturation without supplemental oxygen.     Mariel Craft, MD 04/01/2018 8:36 PM

## 2018-04-01 NOTE — Progress Notes (Signed)
PT Cancellation Note  Patient Details Name: Jeffrey Lin MRN: [id number] DOB: 08/25/1975   Cancelled Treatment:    Reason Eval/Treat Not Completed: Other (comment).  Pt is confused and hallucinating, nursing asked PT to wait for evaluation due to just getting pt back to bed and settling him.  Try again in the AM.   Ivar Drape 04/01/2018, 6:14 PM   Samul Dada, PT MS Acute Rehab Dept. Number: [facility] [id number] and [facility] [phone]

## 2018-04-02 ENCOUNTER — Encounter (HOSPITAL_COMMUNITY): Payer: Self-pay

## 2018-04-02 ENCOUNTER — Ambulatory Visit: Payer: PPO | Admitting: Podiatry

## 2018-04-02 ENCOUNTER — Ambulatory Visit: Payer: PPO | Admitting: Orthotics

## 2018-04-02 LAB — CULTURE, BLOOD (ROUTINE X 2)
CULTURE: NO GROWTH
Culture: NO GROWTH
Special Requests: ADEQUATE
Special Requests: ADEQUATE

## 2018-04-02 MED ORDER — LORAZEPAM 1 MG PO TABS: 1.0000 mg | ORAL_TABLET | ORAL | Status: DC | PRN

## 2018-04-02 MED ORDER — NICOTINE 14 MG/24HR TD PT24
14.0000 mg | MEDICATED_PATCH | Freq: Every day | TRANSDERMAL | Status: DC
  Administered 2018-04-02 – 2018-04-08 (×7): 14 mg via TRANSDERMAL
  Filled 2018-04-02 (×7): qty 1

## 2018-04-02 MED ORDER — LORAZEPAM 2 MG/ML IJ SOLN
1.0000 mg | INTRAMUSCULAR | Status: DC | PRN
  Administered 2018-04-02 – 2018-04-03 (×2): 1 mg via INTRAVENOUS
  Filled 2018-04-02 (×2): qty 1

## 2018-04-02 MED ORDER — HALOPERIDOL 1 MG PO TABS
1.0000 mg | ORAL_TABLET | Freq: Once | ORAL | Status: AC
  Administered 2018-04-02: 1 mg via ORAL
  Filled 2018-04-02: qty 1

## 2018-04-02 NOTE — Evaluation (Signed)
Physical Therapy Evaluation Patient Details Name: Jeffrey Lin MRN: [id number] DOB: 1975-01-25 Today's Date: 04/02/2018   History of Present Illness  From MD H&P: Pt is a 43 yo male with schizoaffective disorder and a history of chronic neurologic problems was brought to the emergency room by his mother because of altered mental status.  Mother reported that the patient had had altered mental status for several days appearing to be responding to internal stimuli and being confused.  On presentation to the emergency room he was not able to give lucid history to the doctor on call.  Patient denied having overdosed on anything.  Pt was a very poor historian.  He slurred his speech and could make out only a few words very slowly of what he said.  The patient himself denied having run out of anything or having any withdrawal.  Patient was then transferred out of behavioral med unit due to respiratory difficulty.     Clinical Impression  Patient received in bed, sitter and mother present for evaluation. Patient alert, oriented to place and self, however speaking nonsensically. Patient agrees to walk. Requires mod assist with supine to sit transfers and sit to stand transfers with +2 assist for safety/equipment. Patient requires increased time and effort to perform all mobility tasks. Patient initially attempted standing without AD, required mod assist and was very unsteady in standing. On second attempt patient stood with RW. Improved stability. Performed marching in place, then ambulated 12 feet with mod assist. Unsteady with gait, min LOB. Patient will benefit from continued skilled PT while here to improve mobility, ambulation, safety for hopeful return home at discharge.        Follow Up Recommendations SNF    Equipment Recommendations  Rolling walker with 5" wheels    Recommendations for Other Services       Precautions / Restrictions Precautions Precautions: Fall Restrictions Weight Bearing  Restrictions: No      Mobility  Bed Mobility Overal bed mobility: Needs Assistance             General bed mobility comments: min assist raising trunk from bed, increased effort and time needed to complete task.    Transfers Overall transfer level: Needs assistance Equipment used: Rolling walker (2 wheeled) Transfers: Sit to/from Stand Sit to Stand: Mod assist;From elevated surface;+2 safety/equipment         General transfer comment: Patient initially stood without AD, very unsteady requiring mod assist to maintain balance and safety.    Ambulation/Gait Ambulation/Gait assistance: Min assist;+2 physical assistance Gait Distance (Feet): 12 Feet Assistive device: Rolling walker (2 wheeled) Gait Pattern/deviations: Step-to pattern;Ataxic;Decreased step length - right;Decreased step length - left;Shuffle Gait velocity: decreased   General Gait Details: mod assist needed for safety and balance, +2 for safety, equipment.   Stairs            Wheelchair Mobility    Modified Rankin (Stroke Patients Only)       Balance Overall balance assessment: Needs assistance Sitting-balance support: Feet supported Sitting balance-Leahy Scale: Fair     Standing balance support: Bilateral upper extremity supported;During functional activity Standing balance-Leahy Scale: Poor Standing balance comment: Mod assist at times for balance with dynamic standing. Increased sway.                              Pertinent Vitals/Pain Pain Assessment: No/denies pain    Home Living Family/patient expects to be discharged to:: Private residence  Living Arrangements: Parent Available Help at Discharge: Family;Available 24 hours/day Type of Home: House Home Access: Stairs to enter Entrance Stairs-Rails: Right;Left;Can reach both Entrance Stairs-Number of Steps: 4 Home Layout: One level Home Equipment: Walker - 2 wheels;Cane - single point Additional Comments: Patient used  cane prior to admission    Prior Function Level of Independence: Independent with assistive device(s)         Comments: Mod Ind amb with a SPC limited community distances, no fall history, Ind with ADLs     Hand Dominance        Extremity/Trunk Assessment   Upper Extremity Assessment Upper Extremity Assessment: Generalized weakness    Lower Extremity Assessment Lower Extremity Assessment: Generalized weakness       Communication   Communication: Expressive difficulties(garbled, non sensical speech)  Cognition Arousal/Alertness: Lethargic Behavior During Therapy: Flat affect Overall Cognitive Status: Impaired/Different from baseline Area of Impairment: Safety/judgement;Attention;Awareness;Memory;Problem solving                 Orientation Level: Disoriented to;Situation;Time Current Attention Level: Alternating Memory: Decreased short-term memory;Decreased recall of precautions Following Commands: Follows one step commands inconsistently;Follows one step commands with increased time Safety/Judgement: Decreased awareness of safety;Decreased awareness of deficits Awareness: Intellectual Problem Solving: Slow processing;Requires verbal cues;Requires tactile cues;Difficulty sequencing        General Comments      Exercises     Assessment/Plan    PT Assessment Patient needs continued PT services  PT Problem List Decreased strength;Decreased balance;Decreased cognition;Decreased knowledge of precautions;Decreased mobility;Decreased knowledge of use of DME;Cardiopulmonary status limiting activity;Decreased activity tolerance;Decreased coordination;Decreased safety awareness       PT Treatment Interventions DME instruction;Functional mobility training;Balance training;Patient/family education;Gait training;Therapeutic activities;Neuromuscular re-education;Stair training;Therapeutic exercise    PT Goals (Current goals can be found in the Care Plan section)   Acute Rehab PT Goals Patient Stated Goal: none stated by patient, to return home with mother PT Goal Formulation: With family Time For Goal Achievement: 04/09/18 Potential to Achieve Goals: Fair    Frequency Min 2X/week   Barriers to discharge        Co-evaluation               AM-PAC PT "6 Clicks" Mobility  Outcome Measure Help needed turning from your back to your side while in a flat bed without using bedrails?: A Little Help needed moving from lying on your back to sitting on the side of a flat bed without using bedrails?: A Little Help needed moving to and from a bed to a chair (including a wheelchair)?: A Lot Help needed standing up from a chair using your arms (e.g., wheelchair or bedside chair)?: A Lot Help needed to walk in hospital room?: A Lot Help needed climbing 3-5 steps with a railing? : Total 6 Click Score: 13    End of Session Equipment Utilized During Treatment: Gait belt;Oxygen Activity Tolerance: Patient limited by lethargy;Other (comment)(decreased safety ) Patient left: in bed;with call bell/phone within reach;with nursing/sitter in room;with family/visitor present Nurse Communication: Mobility status PT Visit Diagnosis: Other abnormalities of gait and mobility (R26.89);Muscle weakness (generalized) (M62.81);Unsteadiness on feet (R26.81);Ataxic gait (R26.0)    Time: 1130-1157 PT Time Calculation (min) (ACUTE ONLY): 27 min   Charges:   PT Evaluation $PT Eval Moderate Complexity: 1 Mod PT Treatments $Gait Training: 8-22 mins        Salisha Bardsley, PT, GCS 04/02/18,12:15 PM

## 2018-04-02 NOTE — Consult Note (Signed)
[facility] Face-to-Face Psychiatry Consult   Reason for Consult: Follow-up from transfer from the behavioral health unit Referring Physician: Dr. Elpidio Anis Patient Identification: Jeffrey Lin MRN:  [id number] Principal Diagnosis: Delirium Diagnosis:  Active Problems:   Pneumonia   Total Time spent with patient: 35 min.  Subjective:   Jeffrey Lin is a 43 y.o. male patient admitted with pneumonia, acute hypoxic respiratory failure, COPD, delirium, history of schizoaffective disorder, and history of polysubstance use disorders.  He was transferred to the medical floor after being found hypoxic early on Saturday morning.  HPI: Per staff the patient "had a rough night last night slept well last night."  Mother is at patient's bedside and reports that he had been agitated this morning but is now calm down.  Patient is calm, speaking softly.  He continues to have auditory and visual hallucinations. He denies SI, HI.  Past Psychiatric History: Diagnosed with schizoaffective disorder, extensive medication history, history of ECT in 2017.  He sees Dr. Helane Rima in [place].  He has a history of suicidal ideation without any attempts  Past Medical History:  Past Medical History:  Diagnosis Date  . Anxiety   . Chronic leg pain   . Depression   . Essential hypertension, benign 10/20/2015  . Schizoaffective disorder [facility])     Past Surgical History:  Procedure Laterality Date  . Block procedure at pain center  03/27/06  . Bone graft from hip to jaw  2004  . Bone graft right side of head to jaw  2006  . 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 Sphenopalatine ganglionic     Family History:  Family History  Problem Relation Age of Onset  . Hyperlipidemia Mother   . Hypertension Mother   . Depression Mother   . Anxiety disorder Mother   . Depression Brother   . Anxiety disorder Brother   . Bipolar disorder Maternal Grandfather   . Schizophrenia Maternal Grandfather   . Bipolar disorder  Paternal Grandfather    Family Psychiatric  History: Unknown Social History:  Social History   Substance and Sexual Activity  Alcohol Use No  . Alcohol/week: 0.0 standard drinks     Social History   Substance and Sexual Activity  Drug Use No    Social History   Socioeconomic History  . Marital status: Single    Spouse name: Not on file  . Number of children: 0  . Years of education: 49  . Highest education level: Not on file  Occupational History  . Occupation: Marketing executive    Employer: [employer]  Social Needs  . Financial resource strain: Not on file  . Food insecurity:    Worry: Not on file    Inability: Not on file  . Transportation needs:    Medical: Not on file    Non-medical: Not on file  Tobacco Use  . Smoking status: Current Every Day Smoker    Packs/day: 1.00    Years: 23.00    Pack years: 23.00    Types: Cigarettes    Start date: 09/22/1995  . Smokeless tobacco: Former Engineer, water and Sexual Activity  . Alcohol use: No    Alcohol/week: 0.0 standard drinks  . Drug use: No  . Sexual activity: Not Currently  Lifestyle  . Physical activity:    Days per week: Not on file    Minutes per session: Not on file  . Stress: Not on file  Relationships  .  Social connections:    Talks on phone: Not on file    Gets together: Not on file    Attends religious service: Not on file    Active member of club or organization: Not on file    Attends meetings of clubs or organizations: Not on file    Relationship status: Not on file  Other Topics Concern  . Not on file  Social History Narrative      Exercise:  None, running causes head pain   Diet:  FF once daily, + fruits and veggies, + H2O, + Soda   Lives with mom in a one story home.  No children.  Currently not working.  Trying to get disability.  Education: college.    Additional Social History:    Allergies:   Allergies  Allergen Reactions  . Toradol [Ketorolac  Tromethamine] Swelling  . Lamotrigine Rash    Labs:  Results for orders placed or performed during the hospital encounter of 03/28/18 (from the past 48 hour(s))  Creatinine, serum     Status: None   Collection Time: 04/01/18  3:58 AM  Result Value Ref Range   Creatinine, Ser 0.88 0.61 - 1.24 mg/dL   GFR calc non Af Amer >60 >60 mL/min   GFR calc Af Amer >60 >60 mL/min    Comment: Performed at [facility], [address], [place], [place] [address]    Current Facility-Administered Medications  Medication Dose Route Frequency Provider Last Rate Last Dose  . acetaminophen (TYLENOL) tablet 650 mg  650 mg Oral Q6H PRN Milagros Loll, MD   650 mg at 03/30/18 6063   Or  . acetaminophen (TYLENOL) suppository 650 mg  650 mg Rectal Q6H PRN Sudini, Wardell Heath, MD      . albuterol (PROVENTIL) (2.5 MG/3ML) 0.083% nebulizer solution 2.5 mg  2.5 mg Nebulization Q2H PRN Sudini, Srikar, MD      . enoxaparin (LOVENOX) injection 40 mg  40 mg Subcutaneous Q24H Milagros Loll, MD   40 mg at 04/01/18 2051  . levofloxacin (LEVAQUIN) tablet 750 mg  750 mg Oral Daily Milagros Loll, MD   750 mg at 04/02/18 1410  . LORazepam (ATIVAN) tablet 1 mg  1 mg Oral Q6H PRN Milagros Loll, MD   1 mg at 04/02/18 1415   Or  . LORazepam (ATIVAN) injection 1 mg  1 mg Intravenous Q6H PRN Milagros Loll, MD   1 mg at 04/01/18 1822  . metoprolol succinate (TOPROL-XL) 24 hr tablet 50 mg  50 mg Oral Daily Milagros Loll, MD   50 mg at 04/02/18 1041  . nicotine (NICODERM CQ - dosed in mg/24 hours) patch 14 mg  14 mg Transdermal Daily Milagros Loll, MD   14 mg at 04/02/18 1124  . ondansetron (ZOFRAN) tablet 4 mg  4 mg Oral Q6H PRN Milagros Loll, MD       Or  . ondansetron (ZOFRAN) injection 4 mg  4 mg Intravenous Q6H PRN Sudini, Srikar, MD      . polyethylene glycol (MIRALAX / GLYCOLAX) packet 17 g  17 g Oral Daily PRN Sudini, Srikar, MD      . predniSONE (DELTASONE) tablet 50 mg  50 mg Oral Q breakfast Milagros Loll, MD    50 mg at 04/02/18 1041  . risperiDONE (RISPERDAL) tablet 2 mg  2 mg Oral QHS Mariel Craft, MD   2 mg at 04/01/18 2159  . tamsulosin (FLOMAX) capsule 0.4 mg  0.4 mg Oral Daily Milagros Loll, MD  0.4 mg at 04/02/18 1042    Musculoskeletal: Strength & Muscle Tone: decreased Gait & Station: unable to stand Patient leans: N/A  Psychiatric Specialty Exam: Physical Exam  Nursing note and vitals reviewed. Constitutional: He is oriented to person, place, and time. He appears well-developed and well-nourished. No distress.  HENT:  Head: Normocephalic and atraumatic.  Neck: Normal range of motion.  Cardiovascular: Regular rhythm.  Respiratory: Effort normal.  Musculoskeletal: Normal range of motion.  Neurological: He is alert and oriented to person, place, and time.  Skin: Skin is warm and dry.    Review of Systems  Constitutional: Negative.   HENT: Negative.   Respiratory: Negative.   Cardiovascular: Negative.   Gastrointestinal: Negative.   Neurological: Negative.   Psychiatric/Behavioral: Positive for hallucinations and memory loss. Negative for depression, substance abuse and suicidal ideas. The patient is nervous/anxious and has insomnia.     Blood pressure (!) 132/97, pulse (!) 103, temperature 98.3 F (36.8 C), temperature source Oral, resp. rate 18, height  (1.854 m), weight 99.5 kg, SpO2 92 %.Body mass index is 28.94 kg/m.  General Appearance: Disheveled  Eye Contact:  Good  Speech:  Clear and Coherent and Normal Rate  Volume:  Decreased  Mood:  Anxious and Dysphoric  Affect:  Blunt  Thought Process:  Coherent  Orientation:  Other:  To self and place  Thought Content:  Hallucinations: Auditory Visual   Suicidal Thoughts:  No   Homicidal Thoughts:  No   Memory:  poor  Judgement:  Impaired  Insight:  Lacking  Psychomotor Activity:  Decreased  Concentration:  Concentration: Poor   Recall:  Poor  Fund of Knowledge:  Fair  Language:  Fair  Akathisia:  No   Handed:  Right  AIMS (if indicated):     Assets:  Housing Social Support  ADL's:  Impaired  Cognition:  Impaired,  Mild secondary to his delirium, improving  Sleep:   Not recorded     Treatment Plan Summary: Daily contact with patient to assess and evaluate symptoms and progress in treatment, Medication management and Plan As follows  Continue Risperdal to 2 mg p.o. HS to assist with his delirium, improve night time sedation and minimize daytime sedation. Will continue to monitor.  Continue CIWA, with as needed Lorazepam for potential benzodiazepine withdrawal.  Would hold off on doing a scheduled taper considering his recent lethargy, and ongoing delirium.   If patient continues to not need benzodiazepine coverage would recommend discontinuation of CIWA. All other psychotropic medications have been held.  Discussed with patient and mother long-acting injectable antipsychotic for maintenance treatment after patient is medically clear and back to mental health baseline.  Both patient and mother are agreeable to this plan.  Disposition: No evidence of imminent risk to self or others at present.   Recommend psychiatric Inpatient admission when medically cleared. and delirium cleared in order that patient can be interactive in milieu, group therapy and inpatient psychiatric treatment programming.  Patient is unable to maintain adequate oxygenation saturation without supplemental oxygen.     Mariel Craft, MD 04/02/2018 4:13 PM

## 2018-04-02 NOTE — Progress Notes (Addendum)
Patient becoming more restless and agitated, trying to get out of bed. On-call MD paged.  1648 - per Dr. Amado Coe, give patient 1mg  PO haldol now and continue to monitor.

## 2018-04-02 NOTE — Progress Notes (Signed)
[facility] [facility] [place] [facility]   PATIENT NAME: Jeffrey Lin    MR#:  [id number]  DATE OF BIRTH:  Feb 29, 1975  SUBJECTIVE:  CHIEF COMPLAINT:  No chief complaint on file.  Awake but confused. Mother at bedside.  sats 87% on RA and now on 2 L O2  REVIEW OF SYSTEMS:    Review of Systems  Unable to perform ROS: Mental status change   DRUG ALLERGIES:   Allergies  Allergen Reactions  . Toradol [Ketorolac Tromethamine] Swelling  . Lamotrigine Rash   VITALS:  Blood pressure (!) 132/97, pulse (!) 103, temperature 98.3 F (36.8 C), temperature source Oral, resp. rate 18, height 6\' 1"  (1.854 m), weight 99.5 kg, SpO2 92 %.  PHYSICAL EXAMINATION:   Physical Exam  GENERAL:  43 y.o.-year-old patient lying in the bed with no acute distress.  EYES: Pupils equal, round, reactive to light and accommodation. No scleral icterus. Extraocular muscles intact.  HEENT: Head atraumatic, normocephalic. Oropharynx and nasopharynx clear.  NECK:  Supple, no jugular venous distention. No thyroid enlargement, no tenderness.  LUNGS: Clear to auscultation bilaterally CARDIOVASCULAR: S1, S2 normal. No murmurs, rubs, or gallops.  ABDOMEN: Soft, nontender, nondistended. Bowel sounds present. No organomegaly or mass.  EXTREMITIES: No cyanosis, clubbing or edema b/l.    NEUROLOGIC: Cranial nerves II through XII are intact. No focal Motor or sensory deficits b/l.   PSYCHIATRIC: The patient is alert and awake.  Confused SKIN: No obvious rash, lesion, or ulcer.   LABORATORY PANEL:   CBC Recent Labs  Lab 03/29/18 0508  WBC 9.6  HGB 13.8  HCT 43.1  PLT 235   ------------------------------------------------------------------------------------------------------------------ Chemistries  Recent Labs  Lab 03/28/18 0627 03/29/18 0508 04/01/18 0358  NA 141 137  --   K 3.6 3.6  --   CL 94* 92*  --   CO2 35* 35*  --   GLUCOSE 122* 100*  --   BUN 24* 17  --   CREATININE 1.28* 1.02  0.88  CALCIUM 8.8* 9.2  --   AST 25  --   --   ALT 24  --   --   ALKPHOS 109  --   --   BILITOT 0.8  --   --    ------------------------------------------------------------------------------------------------------------------  Cardiac Enzymes Recent Labs  Lab 03/28/18 0931  TROPONINI <0.03  ----------------------------------------------------------------------------------------------------------------- RADIOLOGY:  No results found. ASSESSMENT AND PLAN:   *Bibasilar pneumonia with acute hypoxic respiratory failure and sepsis CT scan - No PE On Levaquin PO Cx Negative Sepsis resolved Continue oxygen. Still needing 2 L O2. Repeat chest x-ray shows infiltrate.  * Acute metabolic encephalopathy Due to medications Resolved  *Schizoaffective disorder with depression Sedative medications held.   Consulted psychiatry.  Following Started on Risperidone  Patient weak and PT suggested skilled nursing facility.  Patient was using a wheelchair and behavioral health unit prior to admission.  Was unsteady at baseline per mother.  Patient cannot be discharged to behavioral health unit on oxygen.  Will have to try and wean oxygen.  DVT prophylaxis with Lovenox  All the records are reviewed and case discussed with Care Management/Social Worker Management plans discussed with the patient, family and they are in agreement.  CODE STATUS: Full code   DVT Prophylaxis: SCDs  TOTAL TIME TAKING CARE OF THIS PATIENT: 30 minutes.   POSSIBLE D/C IN 1-2 DAYS, DEPENDING ON CLINICAL CONDITION.  Orie Fisherman M.D on 04/02/2018 at 2:56 PM  Between 7am to 6pm - Pager -  [phone]  After 6pm go to [email or web address] - password [email or web address]  [facility]  Office  [phone]  CC: Primary care physician; Excell Seltzer, MD  Note: This dictation was prepared with Dragon dictation along with smaller phrase technology. Any transcriptional errors that result from this process are  unintentional.

## 2018-04-02 NOTE — Progress Notes (Addendum)
Bladder scan . No discomfort. Dr. Elpidio Anis notified. Per MD, continue to monitor and if repeat scans show at least retention, replace foley catheter.  Patient with intermittent agitation and hallucinations, not time for PRN dose of ativan. Per Dr. Elpidio Anis, change ativan order to every 4 hours PRN.

## 2018-04-02 NOTE — Progress Notes (Signed)
Foley removed per Dr. Eddie North order. Will monitor output.

## 2018-04-03 ENCOUNTER — Inpatient Hospital Stay: Payer: PPO

## 2018-04-03 ENCOUNTER — Ambulatory Visit: Payer: Self-pay

## 2018-04-03 ENCOUNTER — Other Ambulatory Visit: Payer: Self-pay

## 2018-04-03 DIAGNOSIS — F05 Delirium due to known physiological condition: Secondary | ICD-10-CM

## 2018-04-03 LAB — CBC
HCT: 43.5 % (ref 39.0–52.0)
Hemoglobin: 14.4 g/dL (ref 13.0–17.0)
MCH: 28 pg (ref 26.0–34.0)
MCHC: 33.1 g/dL (ref 30.0–36.0)
MCV: 84.5 fL (ref 80.0–100.0)
Platelets: 305 K/uL (ref 150–400)
RBC: 5.15 MIL/uL (ref 4.22–5.81)
RDW: 13.6 % (ref 11.5–15.5)
WBC: 11.2 K/uL — ABNORMAL HIGH (ref 4.0–10.5)
nRBC: 0 % (ref 0.0–0.2)

## 2018-04-03 LAB — LACTATE DEHYDROGENASE, PLEURAL OR PERITONEAL FLUID: LD, Fluid: 506 U/L — ABNORMAL HIGH (ref 3–23)

## 2018-04-03 LAB — PROTEIN, PLEURAL OR PERITONEAL FLUID: Total protein, fluid: 5.9 g/dL

## 2018-04-03 MED ORDER — HALOPERIDOL LACTATE 5 MG/ML IJ SOLN
1.0000 mg | INTRAMUSCULAR | Status: DC | PRN
  Administered 2018-04-03 – 2018-04-06 (×6): 1 mg via INTRAVENOUS
  Filled 2018-04-03 (×7): qty 0.2

## 2018-04-03 MED ORDER — PALIPERIDONE ER 3 MG PO TB24
3.0000 mg | ORAL_TABLET | Freq: Every day | ORAL | Status: DC
  Filled 2018-04-03: qty 1

## 2018-04-03 MED ORDER — PALIPERIDONE ER 3 MG PO TB24
3.0000 mg | ORAL_TABLET | Freq: Every day | ORAL | Status: DC
  Administered 2018-04-03 – 2018-04-05 (×3): 3 mg via ORAL
  Filled 2018-04-03 (×4): qty 1

## 2018-04-03 MED ORDER — PREDNISONE 20 MG PO TABS
20.0000 mg | ORAL_TABLET | Freq: Every day | ORAL | Status: DC
  Administered 2018-04-04: 20 mg via ORAL
  Filled 2018-04-03: qty 1

## 2018-04-03 MED ORDER — ENOXAPARIN SODIUM 40 MG/0.4ML ~~LOC~~ SOLN
40.0000 mg | SUBCUTANEOUS | Status: DC
  Administered 2018-04-04 – 2018-04-07 (×4): 40 mg via SUBCUTANEOUS
  Filled 2018-04-03 (×4): qty 0.4

## 2018-04-03 MED ORDER — LACTULOSE 10 GM/15ML PO SOLN
30.0000 g | Freq: Two times a day (BID) | ORAL | Status: DC
  Administered 2018-04-03 – 2018-04-04 (×4): 30 g via ORAL
  Filled 2018-04-03 (×4): qty 60

## 2018-04-03 MED ORDER — LACTULOSE 10 GM/15ML PO SOLN: 30.0000 g | Freq: Every day | ORAL | Status: DC | PRN

## 2018-04-03 MED ORDER — HALOPERIDOL LACTATE 5 MG/ML IJ SOLN
1.0000 mg | Freq: Four times a day (QID) | INTRAMUSCULAR | Status: DC | PRN
  Administered 2018-04-03: 1 mg via INTRAVENOUS
  Filled 2018-04-03 (×2): qty 0.2

## 2018-04-03 MED ORDER — POLYETHYLENE GLYCOL 3350 17 G PO PACK
17.0000 g | PACK | Freq: Every day | ORAL | Status: DC
  Administered 2018-04-03 – 2018-04-04 (×2): 17 g via ORAL
  Filled 2018-04-03 (×2): qty 1

## 2018-04-03 NOTE — Consult Note (Signed)
[facility] Face-to-Face Psychiatry Consult   Reason for Consult: Follow-up consult for this 43 year old man with a history of schizophrenia or schizoaffective disorder who is currently suffering from delirium and confusion while recovering from pneumonia. Referring Physician: Fonnie Birkenhead Patient Identification: Jeffrey Lin MRN:  [id number] Principal Diagnosis: Subacute delirium Diagnosis:  Principal Problem:   Subacute delirium Active Problems:   Schizoaffective disorder (HCC)   Pneumonia   Total Time spent with patient: 30 minutes  Subjective:   Jeffrey Lin is a 43 y.o. male patient admitted with patient really not able to give a lucid history but this gentleman was transferred to medicine from the psychiatry ward last weekend because of rapidly worsening hypoxia.  HPI: Patient seen chart reviewed.  Spoke with the patient and with his mother.  Patient familiar to me from earlier in his hospital stay.  Complicated psychiatric patient with schizoaffective disorder history of substance abuse complications at times of polypharmacy and of withdrawal.  Last weekend developed hypoxia from pneumonia.  Transferred to medicine.  Continued course has been complicated by frequent agitation and hallucinations.  Patient continues to have hallucinations intermittently through the day.  His agitation waxes and wanes.  Mother thinks to some extent it has to do with who the sitter is who is with him at a particular time.  During the time I was there the patient is still getting very confused and seeing things.  No report of suicidality or violence to others.  Seems to respond well to antipsychotic medicine.  Currently on 2 mg of Risperdal in the evening and as needed IV Haldol.  Past Psychiatric History: Long history of schizoaffective disorder complicated by neurologic problems and substance abuse and polypharmacy.  Risk to Self:   Risk to Others:   Prior Inpatient Therapy:   Prior Outpatient Therapy:     Past Medical History:  Past Medical History:  Diagnosis Date  . Anxiety   . Chronic leg pain   . Depression   . Essential hypertension, benign 10/20/2015  . Schizoaffective disorder [facility])     Past Surgical History:  Procedure Laterality Date  . Block procedure at pain center  03/27/06  . Bone graft from hip to jaw  2004  . Bone graft right side of head to jaw  2006  . 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 Sphenopalatine ganglionic     Family History:  Family History  Problem Relation Age of Onset  . Hyperlipidemia Mother   . Hypertension Mother   . Depression Mother   . Anxiety disorder Mother   . Depression Brother   . Anxiety disorder Brother   . Bipolar disorder Maternal Grandfather   . Schizophrenia Maternal Grandfather   . Bipolar disorder Paternal Grandfather    Family Psychiatric  History: See previous Social History:  Social History   Substance and Sexual Activity  Alcohol Use No  . Alcohol/week: 0.0 standard drinks     Social History   Substance and Sexual Activity  Drug Use No    Social History   Socioeconomic History  . Marital status: Single    Spouse name: Not on file  . Number of children: 0  . Years of education: 20  . Highest education level: Not on file  Occupational History  . Occupation: Marketing executive    Employer: [employer]  Social Needs  . Financial resource strain: Not on file  . Food insecurity:    Worry: Not on file  Inability: Not on file  . Transportation needs:    Medical: Not on file    Non-medical: Not on file  Tobacco Use  . Smoking status: Current Every Day Smoker    Packs/day: 1.00    Years: 23.00    Pack years: 23.00    Types: Cigarettes    Start date: 09/22/1995  . Smokeless tobacco: Former Engineer, water and Sexual Activity  . Alcohol use: No    Alcohol/week: 0.0 standard drinks  . Drug use: No  . Sexual activity: Not Currently  Lifestyle  . Physical activity:    Days per  week: Not on file    Minutes per session: Not on file  . Stress: Not on file  Relationships  . Social connections:    Talks on phone: Not on file    Gets together: Not on file    Attends religious service: Not on file    Active member of club or organization: Not on file    Attends meetings of clubs or organizations: Not on file    Relationship status: Not on file  Other Topics Concern  . Not on file  Social History Narrative      Exercise:  None, running causes head pain   Diet:  FF once daily, + fruits and veggies, + H2O, + Soda   Lives with mom in a one story home.  No children.  Currently not working.  Trying to get disability.  Education: college.    Additional Social History:    Allergies:   Allergies  Allergen Reactions  . Toradol [Ketorolac Tromethamine] Swelling  . Lamotrigine Rash    Labs:  Results for orders placed or performed during the hospital encounter of 03/28/18 (from the past 48 hour(s))  CBC     Status: Abnormal   Collection Time: 04/03/18  5:07 AM  Result Value Ref Range   WBC 11.2 (H) 4.0 - 10.5 K/uL   RBC 5.15 4.22 - 5.81 MIL/uL   Hemoglobin 14.4 13.0 - 17.0 g/dL   HCT 60.4 54.0 - 98.1 %   MCV 84.5 80.0 - 100.0 fL   MCH 28.0 26.0 - 34.0 pg   MCHC 33.1 30.0 - 36.0 g/dL   RDW 19.1 47.8 - 29.5 %   Platelets 305 150 - 400 K/uL   nRBC 0.0 0.0 - 0.2 %    Comment: Performed at [facility], [address], [place], [place] [address]  Lactate dehydrogenase (pleural or peritoneal fluid)     Status: Abnormal   Collection Time: 04/03/18  2:05 PM  Result Value Ref Range   LD, Fluid 506 (H) 3 - 23 U/L    Comment: HEMOLYSIS AT THIS LEVEL MAY AFFECT RESULT (NOTE) Results should be evaluated in conjunction with serum values    Fluid Type-FLDH CYTOPLEU     Comment: Performed at [facility], [address] [place], [place] [address]  Protein, pleural or peritoneal fluid     Status: None   Collection Time: 04/03/18  2:05 PM  Result  Value Ref Range   Total protein, fluid 5.9 g/dL    Comment: (NOTE) No normal range established for this test Results should be evaluated in conjunction with serum values    Fluid Type-FTP CYTOPLEU     Comment: Performed at [facility], [address] [place], [place] [address]    Current Facility-Administered Medications  Medication Dose Route Frequency Provider Last Rate Last Dose  . acetaminophen (TYLENOL) tablet 650 mg  650 mg Oral Q6H PRN Milagros Loll, MD   650 mg at 03/30/18 0737   Or  . acetaminophen (TYLENOL) suppository 650 mg  650 mg Rectal Q6H PRN Sudini, Wardell Heath, MD      . albuterol (PROVENTIL) (2.5 MG/3ML) 0.083% nebulizer solution 2.5 mg  2.5 mg Nebulization Q2H PRN Milagros Loll, MD      . Melene Muller ON 04/04/2018] enoxaparin (LOVENOX) injection 40 mg  40 mg Subcutaneous Q24H Wieting, Richard, MD      . haloperidol lactate (HALDOL) injection 1 mg  1 mg Intravenous Q4H PRN Dashel Goines T, MD      . lactulose (CHRONULAC) 10 GM/15ML solution 30 g  30 g Oral BID Alford Highland, MD   30 g at 04/03/18 1457  . metoprolol succinate (TOPROL-XL) 24 hr tablet 50 mg  50 mg Oral Daily Sudini, Srikar, MD   50 mg at 04/03/18 0929  . nicotine (NICODERM CQ - dosed in mg/24 hours) patch 14 mg  14 mg Transdermal Daily Milagros Loll, MD   14 mg at 04/03/18 0931  . ondansetron (ZOFRAN) tablet 4 mg  4 mg Oral Q6H PRN Milagros Loll, MD       Or  . ondansetron (ZOFRAN) injection 4 mg  4 mg Intravenous Q6H PRN Sudini, Srikar, MD      . paliperidone (INVEGA) 24 hr tablet 3 mg  3 mg Oral QHS Brittiny Levitz T, MD      . polyethylene glycol (MIRALAX / GLYCOLAX) packet 17 g  17 g Oral Daily Alford Highland, MD   17 g at 04/03/18 1457  . [START ON 04/04/2018] predniSONE (DELTASONE) tablet 20 mg  20 mg Oral Q breakfast Wieting, Richard, MD      . tamsulosin [facility]) capsule 0.4 mg  0.4 mg Oral Daily Milagros Loll, MD   0.4 mg at 04/03/18 1062    Musculoskeletal: Strength & Muscle Tone:  within normal limits Gait & Station: unable to stand Patient leans: N/A  Psychiatric Specialty Exam: Physical Exam  Nursing note and vitals reviewed. Constitutional: He appears well-developed and well-nourished.  HENT:  Head: Normocephalic and atraumatic.  Eyes: Pupils are equal, round, and reactive to light. Conjunctivae are normal.  Neck: Normal range of motion.  Cardiovascular: Regular rhythm and normal heart sounds.  Respiratory: Effort normal. No respiratory distress.  GI: Soft.  Musculoskeletal: Normal range of motion.  Neurological: He is alert.  Skin: Skin is warm and dry.  Psychiatric: His affect is blunt. His speech is tangential. He is actively hallucinating. He is not agitated and not aggressive. Thought content is delusional. Cognition and memory are impaired. He expresses inappropriate judgment.    Review of Systems  Constitutional: Positive for malaise/fatigue.  HENT: Negative.   Eyes: Negative.   Respiratory: Positive for shortness of breath.   Cardiovascular: Negative.   Gastrointestinal: Negative.   Musculoskeletal: Negative.   Skin: Negative.   Neurological: Negative.   Psychiatric/Behavioral: Positive for depression, hallucinations and memory loss. Negative for substance abuse and suicidal ideas. The patient is nervous/anxious and has insomnia.     Blood pressure 122/68, pulse (!) 119, temperature (!) 97.5 F (36.4 C), temperature source Oral, resp. rate 20, height 6\' 1"  (1.854 m), weight 97.7 kg, SpO2 94 %.Body mass index is 28.42 kg/m.  General Appearance: Casual  Eye Contact:  Minimal  Speech:  Garbled, Slow and Slurred  Volume:  Decreased  Mood:  Dysphoric  Affect:  Constricted  Thought Process:  Disorganized  Orientation:  Negative  Thought  Content:  Illogical, Hallucinations: Auditory Visual and Rumination  Suicidal Thoughts:  No  Homicidal Thoughts:  No  Memory:  Immediate;   Fair Recent;   Poor Remote;   Poor  Judgement:  Impaired   Insight:  Shallow  Psychomotor Activity:  Decreased and Restlessness  Concentration:  Concentration: Poor  Recall:  Poor  Fund of Knowledge:  Fair  Language:  Fair  Akathisia:  No  Handed:  Right  AIMS (if indicated):     Assets:  Desire for Improvement Financial Resources/Insurance Housing Social Support  ADL's:  Impaired  Cognition:  Impaired,  Moderate  Sleep:        Treatment Plan Summary: Daily contact with patient to assess and evaluate symptoms and progress in treatment, Medication management and Plan Patient not currently able to have the capacity to engage in conversation about his illness.  I spent some time talking with the mother.  It is clear that antipsychotic medicine is helping better than anything else he is getting particularly better than benzodiazepines.  We talked about increasing and strengthening the antipsychotic dose because of his ongoing hallucinations and delirium during the day.  I have increased the frequency with which the Haldol can be given.  I also after talking with her and changing his antipsychotic from Risperdal to [facility] with the hope that he could then ultimately be transferred to a long-acting injectable.  I will sign this out to the doctors over the weekend.  Disposition: Supportive therapy provided about ongoing stressors. Discussed crisis plan, support from social network, calling 911, coming to the Emergency Department, and calling Suicide Hotline.  [name], MD 04/03/2018 5:51 PM

## 2018-04-03 NOTE — Procedures (Signed)
Left effusion  S/p [place] LEFT THORACENTESIS  ONLY 3CC BLOOD ASPIRATED C/W HEMOTHORAX FLUID COMPLEX BY [place] AND WILL NOT DRAIN BY THORACENTESIS  SAMPLE SENT TO LAB CXR PENDING FULL REPORT IN PACS

## 2018-04-03 NOTE — Progress Notes (Signed)
Patient ID: Jeffrey Lin, male   DOB: 1976/01/19, 43 y.o.   MRN: [id number]  [employer] PROGRESS NOTE  DARRELL EMRICH CZY:[id number] DOB: 11-Oct-1975 DOA: 03/28/2018 PCP: Excell Seltzer, MD  HPI/Subjective: Called to see patient because of agitation.  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  He stated that his son was over in the corner.  His mother was in the room.  Patient having difficulty urinating and with constipation.  Was transferred up from psychiatry unit because he requires oxygen.  Patient having problems hearing voices.  Objective: Vitals:   04/03/18 1201 04/03/18 1203  BP: 122/88   Pulse: (!) 112   Resp: 20   Temp: (!) 97.5 F (36.4 C)   SpO2: 90% 90%    Filed Weights   04/02/18 0556 04/02/18 0558 04/03/18 0500  Weight: 99.5 kg 99.5 kg 97.7 kg    ROS: Review of Systems  Constitutional: Negative for chills and fever.  Eyes: Negative for blurred vision.  Respiratory: Positive for cough and shortness of breath.   Cardiovascular: Negative for chest pain.  Gastrointestinal: Positive for constipation. Negative for abdominal pain, diarrhea, nausea and vomiting.  Genitourinary: Positive for dysuria.  Musculoskeletal: Negative for joint pain.  Neurological: Negative for dizziness and headaches.  Psychiatric/Behavioral: Positive for hallucinations.   Exam: Physical Exam  HENT:  Nose: No mucosal edema.  Mouth/Throat: No oropharyngeal exudate or posterior oropharyngeal edema.  Eyes: Pupils are equal, round, and reactive to light. Conjunctivae, EOM and lids are normal.  Neck: No JVD present. Carotid bruit is not present. No edema present. No thyroid mass and no thyromegaly present.  Cardiovascular: S1 normal and S2 normal. Exam reveals no gallop.  No murmur heard. Pulses:      Dorsalis pedis pulses are 2+ on the right side and 2+ on the left side.  Respiratory: No respiratory distress. He has decreased breath sounds in the left lower field. He has  no wheezes. He has no rhonchi. He has no rales.  GI: Soft. Bowel sounds are normal. There is no abdominal tenderness.  Musculoskeletal:     Right ankle: He exhibits no swelling.     Left ankle: He exhibits no swelling.  Lymphadenopathy:    He has no cervical adenopathy.  Neurological: He is alert.  Patient moves all extremities without a problem.  Skin: Skin is warm. No rash noted. Nails show no clubbing.  Psychiatric:  Patient answers some questions appropriately and other questions not appropriately.      Data Reviewed: Basic Metabolic Panel: Recent Labs  Lab 03/28/18 0627 03/29/18 0508 04/01/18 0358  NA 141 137  --   K 3.6 3.6  --   CL 94* 92*  --   CO2 35* 35*  --   GLUCOSE 122* 100*  --   BUN 24* 17  --   CREATININE 1.28* 1.02 0.88  CALCIUM 8.8* 9.2  --    Liver Function Tests: Recent Labs  Lab 03/28/18 0627  AST 25  ALT 24  ALKPHOS 109  BILITOT 0.8  PROT 6.8  ALBUMIN 3.5   Recent Labs  Lab 03/28/18 0928  AMMONIA 23   CBC: Recent Labs  Lab 03/28/18 0627 03/29/18 0508 04/03/18 0507  WBC 12.6* 9.6 11.2*  HGB 14.6 13.8 14.4  HCT 44.7 43.1 43.5  MCV 86.1 87.6 84.5  PLT 229 235 305   Cardiac Enzymes: Recent Labs  Lab 03/28/18 0931  TROPONINI <0.03   BNP (last 3 results)  Recent Labs    03/28/18 0931  BNP 49.0      Recent Results (from the past 240 hour(s))  Culture, blood (routine x 2) Call MD if unable to obtain prior to antibiotics being given     Status: None   Collection Time: 03/28/18  1:53 PM  Result Value Ref Range Status   Specimen Description BLOOD LEFT ANTECUBITAL  Final   Special Requests   Final    BOTTLES DRAWN AEROBIC AND ANAEROBIC Blood Culture adequate volume   Culture   Final    NO GROWTH 5 DAYS Performed at [facility], [address], [place], [place] [address]    Report Status 04/02/2018 FINAL  Final  Culture, blood (routine x 2) Call MD if unable to obtain prior to antibiotics being given     Status:  None   Collection Time: 03/28/18  1:53 PM  Result Value Ref Range Status   Specimen Description BLOOD RIGHT ANTECUBITAL  Final   Special Requests   Final    BOTTLES DRAWN AEROBIC AND ANAEROBIC Blood Culture adequate volume   Culture   Final    NO GROWTH 5 DAYS Performed at [facility], [address] [place], [place] [address]    Report Status 04/02/2018 FINAL  Final      Scheduled Meds: . enoxaparin (LOVENOX) injection  40 mg Subcutaneous Q24H  . levofloxacin  750 mg Oral Daily  . metoprolol succinate  50 mg Oral Daily  . nicotine  14 mg Transdermal Daily  . polyethylene glycol  17 g Oral Daily  . [START ON 04/04/2018] predniSONE  20 mg Oral Q breakfast  . risperiDONE  2 mg Oral QHS  . tamsulosin  0.4 mg Oral Daily   Continuous Infusions:  Assessment/Plan:  1.  Acute hypoxic respiratory failure.  Patient was having difficulty coming off the oxygen.  Looks like at rest now off oxygen. 2.  Clinical sepsis with bibasilar pneumonia.  Finishing up course of p.o. Levaquin.  Taper off steroids quickly. 3.  Left pleural effusion.  Interventional radiology only able to draw off 3 cc of blood. 4.  Urinary retention.  Started on Flomax.  In and out catheterizations.  May end up getting a CT renal scan protocol tomorrow morning. 5.  Constipation.  Give lactulose until bowel movement.  6.  Acute metabolic encephalopathy and delirium with auditory hallucinations.  Follow-up with psychiatry.  Stop IV Ativan.  PRN IV Haldol.  On low-dose risperidone. 7.  Weakness.  Physical therapy evaluation.  Ambulate on room air.      Code Status:     Code Status Orders  (From admission, onward)         Start     Ordered   03/28/18 1136  Full code  Continuous     03/28/18 1135        Code Status History    Date Active Date Inactive Code Status Order ID Comments User Context   03/22/2018 2009 03/28/2018 1011 Full Code [id number]  Reggie Pile, MD Inpatient     Family Communication:  Mother at bedside Disposition Plan: To be determined  Consultants:  Psychiatry  Time spent: 28 minutes  Dahlila Pfahler [employer]

## 2018-04-03 NOTE — Progress Notes (Signed)
Spoke with pt earlier in shift about wearing Bipap, pt stated he would try a nasal mask. At this time pt is refusing to try Bipap unless he can walk around. RN aware of pt refusal.

## 2018-04-03 NOTE — Care Management Important Message (Signed)
Important Message  Patient Details  Name: Jeffrey Lin MRN: [id number] Date of Birth: 09/08/1975   Medicare Important Message Given:  Yes    [name] 04/03/2018, 10:17 AM

## 2018-04-04 ENCOUNTER — Inpatient Hospital Stay: Payer: PPO

## 2018-04-04 LAB — CBC
HCT: 44.2 % (ref 39.0–52.0)
Hemoglobin: 14.5 g/dL (ref 13.0–17.0)
MCH: 27.7 pg (ref 26.0–34.0)
MCHC: 32.8 g/dL (ref 30.0–36.0)
MCV: 84.4 fL (ref 80.0–100.0)
Platelets: 277 10*3/uL (ref 150–400)
RBC: 5.24 MIL/uL (ref 4.22–5.81)
RDW: 13.7 % (ref 11.5–15.5)
WBC: 10.4 10*3/uL (ref 4.0–10.5)
nRBC: 0 % (ref 0.0–0.2)

## 2018-04-04 LAB — BASIC METABOLIC PANEL
Anion gap: 8 (ref 5–15)
BUN: 28 mg/dL — AB (ref 6–20)
CO2: 30 mmol/L (ref 22–32)
Calcium: 9.1 mg/dL (ref 8.9–10.3)
Chloride: 101 mmol/L (ref 98–111)
Creatinine, Ser: 1.06 mg/dL (ref 0.61–1.24)
GFR calc Af Amer: >60 mL/min (ref >60)
GFR calc non Af Amer: >60 mL/min (ref >60)
Glucose, Bld: 95 mg/dL (ref 70–99)
Potassium: 3.6 mmol/L (ref 3.5–5.1)
Sodium: 139 mmol/L (ref 135–145)

## 2018-04-04 MED ORDER — IOPAMIDOL (ISOVUE-300) INJECTION 61%
15.0000 mL | INTRAVENOUS | Status: AC
  Administered 2018-04-04 (×2): 15 mL via ORAL

## 2018-04-04 MED ORDER — DIPHENHYDRAMINE HCL 25 MG PO CAPS
25.0000 mg | ORAL_CAPSULE | Freq: Four times a day (QID) | ORAL | Status: DC | PRN
  Administered 2018-04-04 – 2018-04-06 (×3): 25 mg via ORAL
  Filled 2018-04-04 (×3): qty 1

## 2018-04-04 MED ORDER — IOHEXOL 300 MG/ML  SOLN
100.0000 mL | Freq: Once | INTRAMUSCULAR | Status: AC | PRN
  Administered 2018-04-04: 100 mL via INTRAVENOUS

## 2018-04-04 NOTE — Progress Notes (Signed)
SATURATION QUALIFICATIONS: (This note is used to comply with regulatory documentation for home oxygen)  Patient Saturations on Room Air at Rest = 90  Patient Saturations on Room Air while Ambulating = 78  Patient Saturations on 1 Liters of oxygen while Ambulating = 94  Please briefly explain why patient needs home oxygen:

## 2018-04-04 NOTE — Progress Notes (Signed)
Patient ID: Jeffrey Lin, male   DOB: 1975/08/08, 43 y.o.   MRN: [id number]  [employer] [facility] PROGRESS NOTE  Jeffrey Lin PNT:[id number] DOB: [birth year] DOA: 03/28/2018 PCP: Excell Seltzer, MD  HPI/Subjective: Patient feeling okay.  Better with responses today than yesterday.  Still shortness of breath minimal cough.  Objective: Vitals:   04/04/18 1204 04/04/18 1209  BP: 124/89 116/84  Pulse: (!) 106 (!) 103  Resp: 16 17  Temp:  98.1 F (36.7 C)  SpO2: 93% 93%    Filed Weights   04/02/18 0558 04/03/18 0500 04/04/18 0657  Weight: 99.5 kg 97.7 kg 97.8 kg    ROS: Review of Systems  Constitutional: Negative for chills and fever.  Eyes: Negative for blurred vision.  Respiratory: Positive for cough and shortness of breath.   Cardiovascular: Negative for chest pain.  Gastrointestinal: Positive for constipation. Negative for abdominal pain, diarrhea, nausea and vomiting.  Genitourinary: Negative for dysuria.  Musculoskeletal: Negative for joint pain.  Neurological: Negative for dizziness and headaches.   Exam: Physical Exam  HENT:  Nose: No mucosal edema.  Mouth/Throat: No oropharyngeal exudate or posterior oropharyngeal edema.  Eyes: Pupils are equal, round, and reactive to light. Conjunctivae, EOM and lids are normal.  Neck: No JVD present. Carotid bruit is not present. No edema present. No thyroid mass and no thyromegaly present.  Cardiovascular: S1 normal and S2 normal. Exam reveals no gallop.  No murmur heard. Pulses:      Dorsalis pedis pulses are 2+ on the right side and 2+ on the left side.  Respiratory: No respiratory distress. He has decreased breath sounds in the left lower field. He has no wheezes. He has no rhonchi. He has no rales.  GI: Soft. Bowel sounds are normal. There is no abdominal tenderness.  Musculoskeletal:     Right ankle: He exhibits no swelling.     Left ankle: He exhibits no swelling.  Lymphadenopathy:    He has no cervical adenopathy.   Neurological: He is alert.  Patient moves all extremities without a problem.  Skin: Skin is warm. No rash noted. Nails show no clubbing.  Psychiatric:  Patient answers some questions appropriately and other questions not appropriately.      Data Reviewed: Basic Metabolic Panel: Recent Labs  Lab 03/29/18 0508 04/01/18 0358 04/04/18 0631  NA 137  --  139  K 3.6  --  3.6  CL 92*  --  101  CO2 35*  --  30  GLUCOSE 100*  --  95  BUN 17  --  28*  CREATININE 1.02 0.88 1.06  CALCIUM 9.2  --  9.1  CBC: Recent Labs  Lab 03/29/18 0508 04/03/18 0507 04/04/18 0631  WBC 9.6 11.2* 10.4  HGB 13.8 14.4 14.5  HCT 43.1 43.5 44.2  MCV 87.6 84.5 84.4  PLT 235 305 277   BNP (last 3 results) Recent Labs    03/28/18 0931  BNP 49.0      Recent Results (from the past 240 hour(s))  Culture, blood (routine x 2) Call MD if unable to obtain prior to antibiotics being given     Status: None   Collection Time: 03/28/18  1:53 PM  Result Value Ref Range Status   Specimen Description BLOOD LEFT ANTECUBITAL  Final   Special Requests   Final    BOTTLES DRAWN AEROBIC AND ANAEROBIC Blood Culture adequate volume   Culture   Final    NO GROWTH 5 DAYS Performed at [facility]  [facility], [address] [place], [place] [address]    Report Status 04/02/2018 FINAL  Final  Culture, blood (routine x 2) Call MD if unable to obtain prior to antibiotics being given     Status: None   Collection Time: 03/28/18  1:53 PM  Result Value Ref Range Status   Specimen Description BLOOD RIGHT ANTECUBITAL  Final   Special Requests   Final    BOTTLES DRAWN AEROBIC AND ANAEROBIC Blood Culture adequate volume   Culture   Final    NO GROWTH 5 DAYS Performed at [facility], [address], [place], [place] [address]    Report Status 04/02/2018 FINAL  Final  Body fluid culture     Status: None (Preliminary result)   Collection Time: 04/03/18  2:26 PM  Result Value Ref Range Status   Specimen  Description   Final    PLEURAL Performed at [facility], [address], [place], [place] [address]    Special Requests   Final    NONE Performed at [facility], [address] [place], [place] [address]    Gram Stain   Final    FEW WBC PRESENT, PREDOMINANTLY MONONUCLEAR NO ORGANISMS SEEN    Culture   Final    NO GROWTH < 24 HOURS Performed at [facility], [address], [place], [place] [address]    Report Status PENDING  Incomplete      Scheduled Meds: . enoxaparin (LOVENOX) injection  40 mg Subcutaneous Q24H  . lactulose  30 g Oral BID  . metoprolol succinate  50 mg Oral Daily  . nicotine  14 mg Transdermal Daily  . paliperidone  3 mg Oral Daily  . polyethylene glycol  17 g Oral Daily  . predniSONE  20 mg Oral Q breakfast  . tamsulosin  0.4 mg Oral Daily   Continuous Infusions:  Assessment/Plan:  1.  Acute hypoxic respiratory failure.  Patient's pulse ox dropped down with ambulation into the 70s.  Still requires oxygen. 2.  Clinical sepsis with bibasilar pneumonia.  Finished up Levaquin.  Discontinue steroids. 3.  Left pleural effusion.  Interventional radiology only able to draw off 3 cc of blood. 4.  Urinary retention.  Started on Flomax.  In and out catheterizations.  We will get CT scan of the abdomen pelvis for further evaluation 5.  Constipation.  Give lactulose until bowel movement.  6.  Acute metabolic encephalopathy and delirium with auditory hallucinations.  Follow-up with psychiatry.  Stopped IV Ativan.  PRN IV Haldol.  On low-dose risperidone. 7.  Weakness.  Physical therapy evaluation.  Ambulate on room air.      Code Status:     Code Status Orders  (From admission, onward)         Start     Ordered   03/28/18 1136  Full code  Continuous     03/28/18 1135        Code Status History    Date Active Date Inactive Code Status Order ID Comments User Context   03/22/2018 2009 03/28/2018 1011 Full Code [id number]  Jeffrey Pile, MD Inpatient     Family Communication: Mother at bedside Disposition Plan: To be determined  Consultants:  Psychiatry  Time spent: 27 minutes  Vernice Mannina [employer]

## 2018-04-04 NOTE — Progress Notes (Signed)
Per MD okay for pt go back to his regular diet after CT scan.

## 2018-04-05 ENCOUNTER — Encounter: Payer: Self-pay | Admitting: Urology

## 2018-04-05 DIAGNOSIS — N201 Calculus of ureter: Secondary | ICD-10-CM

## 2018-04-05 DIAGNOSIS — K5903 Drug induced constipation: Secondary | ICD-10-CM

## 2018-04-05 DIAGNOSIS — I4581 Long QT syndrome: Secondary | ICD-10-CM

## 2018-04-05 DIAGNOSIS — R338 Other retention of urine: Secondary | ICD-10-CM

## 2018-04-05 LAB — URINALYSIS, COMPLETE (UACMP) WITH MICROSCOPIC
Bilirubin Urine: NEGATIVE
Glucose, UA: NEGATIVE mg/dL
Ketones, ur: NEGATIVE mg/dL
LEUKOCYTE UA: NEGATIVE
Nitrite: NEGATIVE
PH: 5 (ref 5.0–8.0)
Protein, ur: 30 mg/dL — AB
Specific Gravity, Urine: 1.034 — ABNORMAL HIGH (ref 1.005–1.030)
Squamous Epithelial / HPF: NONE SEEN (ref 0–5)

## 2018-04-05 LAB — MAGNESIUM: Magnesium: 2.3 mg/dL (ref 1.7–2.4)

## 2018-04-05 LAB — PHOSPHORUS: Phosphorus: 3.5 mg/dL (ref 2.5–4.6)

## 2018-04-05 LAB — GLUCOSE, CAPILLARY: Glucose-Capillary: 99 mg/dL (ref 70–99)

## 2018-04-05 MED ORDER — FLEET ENEMA 7-19 GM/118ML RE ENEM: 1.0000 | ENEMA | Freq: Every day | RECTAL | Status: DC | PRN

## 2018-04-05 MED ORDER — SODIUM CHLORIDE 0.9 % IV SOLN
INTRAVENOUS | Status: DC
  Administered 2018-04-05 – 2018-04-06 (×2): via INTRAVENOUS

## 2018-04-05 MED ORDER — FLEET ENEMA 7-19 GM/118ML RE ENEM: 1.0000 | ENEMA | Freq: Every day | RECTAL | Status: DC

## 2018-04-05 MED ORDER — MAGNESIUM CITRATE PO SOLN
1.0000 | Freq: Once | ORAL | Status: AC
  Administered 2018-04-05: 1 via ORAL
  Filled 2018-04-05: qty 296

## 2018-04-05 MED ORDER — BISACODYL 10 MG RE SUPP
10.0000 mg | Freq: Every day | RECTAL | Status: DC
  Administered 2018-04-05 – 2018-04-07 (×3): 10 mg via RECTAL
  Filled 2018-04-05 (×4): qty 1

## 2018-04-05 MED ORDER — POLYETHYLENE GLYCOL 3350 17 G PO PACK
17.0000 g | PACK | Freq: Two times a day (BID) | ORAL | Status: DC
  Administered 2018-04-06 – 2018-04-08 (×6): 17 g via ORAL
  Filled 2018-04-05 (×6): qty 1

## 2018-04-05 MED ORDER — PALIPERIDONE ER 6 MG PO TB24
6.0000 mg | ORAL_TABLET | Freq: Every day | ORAL | Status: DC
  Administered 2018-04-06 – 2018-04-08 (×3): 6 mg via ORAL
  Filled 2018-04-05 (×3): qty 1

## 2018-04-05 NOTE — Progress Notes (Signed)
Pt refuses to wear Bipap. No distress noted.

## 2018-04-05 NOTE — Progress Notes (Addendum)
[facility] MD Progress Note  04/05/2018 4:46 PM Jeffrey Lin  MRN:  [id number] Subjective: Patient is lying in bed no acute distress states his breathing is a bit shallower short, he has a sitter with him.  He denies active hallucinations at this time but states his mood is "up and down" and he would like to see if increase in the medication will help.  He has how long it will take.  He denies any intent of hurting himself at this point.  He denies side effects, he increase Invega to 6 mg daily. Principal Problem: Subacute delirium Diagnosis: Principal Problem:   Subacute delirium Active Problems:   Schizoaffective disorder (HCC)   Pneumonia  Total Time spent with patient: 25 min  Past Psychiatric History: Long history of schizoaffective disorder complicated by neurologic problems and substance abuse and polypharmacy.  Past Medical History:  Past Medical History:  Diagnosis Date  . Anxiety   . Chronic leg pain   . Depression   . Essential hypertension, benign 10/20/2015  . Schizoaffective disorder [facility])     Past Surgical History:  Procedure Laterality Date  . Block procedure at pain center  03/27/06  . Bone graft from hip to jaw  2004  . Bone graft right side of head to jaw  2006  . 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 Sphenopalatine ganglionic     Family History:  Family History  Problem Relation Age of Onset  . Hyperlipidemia Mother   . Hypertension Mother   . Depression Mother   . Anxiety disorder Mother   . Depression Brother   . Anxiety disorder Brother   . Bipolar disorder Maternal Grandfather   . Schizophrenia Maternal Grandfather   . Bipolar disorder Paternal Grandfather     Social History:  Social History   Substance and Sexual Activity  Alcohol Use No  . Alcohol/week: 0.0 standard drinks     Social History   Substance and Sexual Activity  Drug Use No    Social History   Socioeconomic History  . Marital status: Single    Spouse name: Not on  file  . Number of children: 0  . Years of education: 47  . Highest education level: Not on file  Occupational History  . Occupation: Marketing executive    Employer: [employer]  Social Needs  . Financial resource strain: Not on file  . Food insecurity:    Worry: Not on file    Inability: Not on file  . Transportation needs:    Medical: Not on file    Non-medical: Not on file  Tobacco Use  . Smoking status: Current Every Day Smoker    Packs/day: 1.00    Years: 23.00    Pack years: 23.00    Types: Cigarettes    Start date: 09/22/1995  . Smokeless tobacco: Former Engineer, water and Sexual Activity  . Alcohol use: No    Alcohol/week: 0.0 standard drinks  . Drug use: No  . Sexual activity: Not Currently  Lifestyle  . Physical activity:    Days per week: Not on file    Minutes per session: Not on file  . Stress: Not on file  Relationships  . Social connections:    Talks on phone: Not on file    Gets together: Not on file    Attends religious service: Not on file    Active member of club or organization: Not on file  Attends meetings of clubs or organizations: Not on file    Relationship status: Not on file  Other Topics Concern  . Not on file  Social History Narrative      Exercise:  None, running causes head pain   Diet:  FF once daily, + fruits and veggies, + H2O, + Soda   Lives with mom in a one story home.  No children.  Currently not working.  Trying to get disability.  Education: college.    Additional Social History:                         Sleep: Poor  Appetite:  Good  Current Medications: Current Facility-Administered Medications  Medication Dose Route Frequency Provider Last Rate Last Dose  . 0.9 %  sodium chloride infusion   Intravenous Continuous Alford HighlandWieting, Richard, MD 50 mL/hr at 04/05/18 0754    . acetaminophen (TYLENOL) tablet 650 mg  650 mg Oral Q6H PRN Milagros LollSudini, Srikar, MD   650 mg at 03/30/18 [id number]   Or  .  acetaminophen (TYLENOL) suppository 650 mg  650 mg Rectal Q6H PRN Sudini, Wardell HeathSrikar, MD      . albuterol (PROVENTIL) (2.5 MG/3ML) 0.083% nebulizer solution 2.5 mg  2.5 mg Nebulization Q2H PRN Sudini, Srikar, MD      . bisacodyl (DULCOLAX) suppository 10 mg  10 mg Rectal Daily Alford HighlandWieting, Richard, MD   10 mg at 04/05/18 0740  . diphenhydrAMINE (BENADRYL) capsule 25 mg  25 mg Oral Q6H PRN Alford HighlandWieting, Richard, MD   25 mg at 04/04/18 1235  . enoxaparin (LOVENOX) injection 40 mg  40 mg Subcutaneous Q24H Wieting, Richard, MD   40 mg at 04/05/18 1244  . haloperidol lactate (HALDOL) injection 1 mg  1 mg Intravenous Q4H PRN Clapacs, Jackquline DenmarkJohn T, MD   1 mg at 04/05/18 1527  . metoprolol succinate (TOPROL-XL) 24 hr tablet 50 mg  50 mg Oral Daily Milagros LollSudini, Srikar, MD   50 mg at 04/05/18 1243  . nicotine (NICODERM CQ - dosed in mg/24 hours) patch 14 mg  14 mg Transdermal Daily Milagros LollSudini, Srikar, MD   14 mg at 04/05/18 1244  . ondansetron (ZOFRAN) tablet 4 mg  4 mg Oral Q6H PRN Milagros LollSudini, Srikar, MD       Or  . ondansetron (ZOFRAN) injection 4 mg  4 mg Intravenous Q6H PRN Sudini, Srikar, MD      . paliperidone (INVEGA) 24 hr tablet 3 mg  3 mg Oral Daily Bertram SavinSimpson, Michael L, RPH   3 mg at 04/05/18 1521  . polyethylene glycol (MIRALAX / GLYCOLAX) packet 17 g  17 g Oral BID Allegra LaiVanga, Loel Dubonnetohini Reddy, MD      . sodium phosphate (FLEET) 7-19 GM/118ML enema 1 enema  1 enema Rectal QHS Renae GlossWieting, Richard, MD      . tamsulosin [facility](FLOMAX) capsule 0.4 mg  0.4 mg Oral Daily Milagros LollSudini, Srikar, MD   0.4 mg at 04/05/18 1243    Lab Results:  Results for orders placed or performed during the hospital encounter of 03/28/18 (from the past 48 hour(s))  CBC     Status: None   Collection Time: 04/04/18  6:31 AM  Result Value Ref Range   WBC 10.4 4.0 - 10.5 K/uL   RBC 5.24 4.22 - 5.81 MIL/uL   Hemoglobin 14.5 13.0 - 17.0 g/dL   HCT 56.244.2 13.039.0 - 86.552.0 %   MCV 84.4 80.0 - 100.0 fL   MCH 27.7 26.0 - 34.0 pg  MCHC 32.8 30.0 - 36.0 g/dL   RDW 10.9 60.4 - 54.0 %    Platelets 277 150 - 400 K/uL   nRBC 0.0 0.0 - 0.2 %    Comment: Performed at [facility], [address] [place], [place] [address]  Basic metabolic panel     Status: Abnormal   Collection Time: 04/04/18  6:31 AM  Result Value Ref Range   Sodium 139 135 - 145 mmol/L   Potassium 3.6 3.5 - 5.1 mmol/L   Chloride 101 98 - 111 mmol/L   CO2 30 22 - 32 mmol/L   Glucose, Bld 95 70 - 99 mg/dL   BUN 28 (H) 6 - 20 mg/dL   Creatinine, Ser 1.47 0.61 - 1.24 mg/dL   Calcium 9.1 8.9 - 82.9 mg/dL   GFR calc non Af Amer >60 >60 mL/min   GFR calc Af Amer >60 >60 mL/min   Anion gap 8 5 - 15    Comment: Performed at [facility], [address] [place], [place] [address]  Glucose, capillary     Status: None   Collection Time: 04/05/18  5:57 AM  Result Value Ref Range   Glucose-Capillary 99 70 - 99 mg/dL   Comment 1 Notify RN   Magnesium     Status: None   Collection Time: 04/05/18  2:28 PM  Result Value Ref Range   Magnesium 2.3 1.7 - 2.4 mg/dL    Comment: Performed at [facility], [address] [place], [place] [address]  Phosphorus     Status: None   Collection Time: 04/05/18  2:28 PM  Result Value Ref Range   Phosphorus 3.5 2.5 - 4.6 mg/dL    Comment: Performed at [facility], [address] [place], [place] [address]    Blood Alcohol level:  Lab Results  Component Value Date   [facility] <10 03/22/2018   ETH <11 04/27/2012    Metabolic Disorder Labs: Lab Results  Component Value Date   HGBA1C 5.9 11/01/2015   No results found for: PROLACTIN Lab Results  Component Value Date   CHOL 191 03/18/2016   TRIG 363.0 (H) 03/18/2016   HDL 35.60 (L) 03/18/2016   CHOLHDL 5 03/18/2016   VLDL 72.6 (H) 03/18/2016   LDLCALC 84 11/01/2015   LDLCALC 102 (H) 09/28/2010    Physical Findings: AIMS:  , ,  ,  ,    CIWA:  CIWA-Ar Total: 0 COWS:     Musculoskeletal: Strength & Muscle Tone: within normal limits Gait & Station: in bed Patient leans:  N/A  Psychiatric Specialty Exam: Physical Exam  ROS  Blood pressure 105/72, pulse 94, temperature 98.7 F (37.1 C), temperature source Oral, resp. rate 20, height  (1.854 m), weight 98.9 kg, SpO2 95 %.Body mass index is 28.76 kg/m.  General Appearance: Casual  Eye Contact:  Poor  Speech:  Slow  Volume:  Decreased  Mood:  Anxious and Dysphoric  Affect:  Congruent  Thought Process:  Disorganized  Orientation:  Other:  name situation  Thought Content:  Possibly decreased hallucinations today although he seems eager to try increased medication hoping it will help with his mood going up and down and I suspect that he probably is having some hallucinations, he seems guarded  Suicidal Thoughts:  No  Homicidal Thoughts:  No  Memory:  Immediate;   Poor Recent;   Poor Remote;   Poor  Judgement:  Impaired  Insight:  Shallow  Psychomotor Activity:  Decreased  Concentration:  Concentration: Poor and Attention Span: Poor  Recall:  [employer] of Knowledge:  Poor  Language:  Fair  Akathisia:  No  Handed:  Right  AIMS (if indicated):     Assets:  Financial Resources/Insurance Housing Social Support  ADL's:  Impaired  Cognition:  Impaired,  Moderate  Sleep:        Treatment Plan Summary: Medication management and Plan Titrate Invega to 6 mg daily supportive therapy if stabilizes outpatient treatment however if needed consider psychiatric inpatient readmission.  Prior EKG QTc 469 ms mildly prolonged repeat EKG on 04/08/2018 concerning QTC on Invega.   Terance Hart, MD 04/05/2018, 4:46 PM

## 2018-04-05 NOTE — Progress Notes (Signed)
Patient ID: ROB NIDIFFER, male   DOB: 1975/05/19, 43 y.o.   MRN: [id number]  [employer] [facility] PROGRESS NOTE  STEFON BENALLY WUJ:[id number] DOB: 1975/09/24 DOA: 03/28/2018 PCP: Excell Seltzer, MD  HPI/Subjective: Patient still with difficulty urinating.  Still did not have a bowel movement until suppository given today.  No further abdominal discomfort.  Objective: Vitals:   04/04/18 2010 04/05/18 0555  BP: 126/86 105/72  Pulse: (!) 104 94  Resp: 20 20  Temp: 98.4 F (36.9 C) 98.7 F (37.1 C)  SpO2: 93% 95%    Filed Weights   04/03/18 0500 04/04/18 0657 04/05/18 0430  Weight: 97.7 kg 97.8 kg 98.9 kg    ROS: Review of Systems  Constitutional: Negative for chills and fever.  Eyes: Negative for blurred vision.  Respiratory: Positive for cough and shortness of breath.   Cardiovascular: Negative for chest pain.  Gastrointestinal: Positive for constipation. Negative for abdominal pain, diarrhea, nausea and vomiting.  Musculoskeletal: Negative for joint pain.  Neurological: Negative for dizziness and headaches.   Exam: Physical Exam  HENT:  Nose: No mucosal edema.  Mouth/Throat: No oropharyngeal exudate or posterior oropharyngeal edema.  Eyes: Pupils are equal, round, and reactive to light. Conjunctivae, EOM and lids are normal.  Neck: No JVD present. Carotid bruit is not present. No edema present. No thyroid mass and no thyromegaly present.  Cardiovascular: S1 normal and S2 normal. Exam reveals no gallop.  No murmur heard. Pulses:      Dorsalis pedis pulses are 2+ on the right side and 2+ on the left side.  Respiratory: No respiratory distress. He has decreased breath sounds in the left lower field. He has no wheezes. He has no rhonchi. He has no rales.  GI: Soft. Bowel sounds are normal. There is no abdominal tenderness.  Musculoskeletal:     Right ankle: He exhibits no swelling.     Left ankle: He exhibits no swelling.  Lymphadenopathy:    He has no cervical adenopathy.   Neurological: He is alert.  Patient moves all extremities without a problem.  Skin: Skin is warm. No rash noted. Nails show no clubbing.  Psychiatric:  Patient answers some questions appropriately and other questions not appropriately.      Data Reviewed: Basic Metabolic Panel: Recent Labs  Lab 04/01/18 0358 04/04/18 0631  NA  --  139  K  --  3.6  CL  --  101  CO2  --  30  GLUCOSE  --  95  BUN  --  28*  CREATININE 0.88 1.06  CALCIUM  --  9.1  CBC: Recent Labs  Lab 04/03/18 0507 04/04/18 0631  WBC 11.2* 10.4  HGB 14.4 14.5  HCT 43.5 44.2  MCV 84.5 84.4  PLT 305 277   BNP (last 3 results) Recent Labs    03/28/18 0931  BNP 49.0      Recent Results (from the past 240 hour(s))  Culture, blood (routine x 2) Call MD if unable to obtain prior to antibiotics being given     Status: None   Collection Time: 03/28/18  1:53 PM  Result Value Ref Range Status   Specimen Description BLOOD LEFT ANTECUBITAL  Final   Special Requests   Final    BOTTLES DRAWN AEROBIC AND ANAEROBIC Blood Culture adequate volume   Culture   Final    NO GROWTH 5 DAYS Performed at [facility], [address], [place], [place] [address]    Report Status 04/02/2018 FINAL  Final  Culture, blood (routine x 2) Call MD if unable to obtain prior to antibiotics being given     Status: None   Collection Time: 03/28/18  1:53 PM  Result Value Ref Range Status   Specimen Description BLOOD RIGHT ANTECUBITAL  Final   Special Requests   Final    BOTTLES DRAWN AEROBIC AND ANAEROBIC Blood Culture adequate volume   Culture   Final    NO GROWTH 5 DAYS Performed at [facility], [address], [place], [place] [address]    Report Status 04/02/2018 FINAL  Final  Body fluid culture     Status: None (Preliminary result)   Collection Time: 04/03/18  2:26 PM  Result Value Ref Range Status   Specimen Description   Final    PLEURAL Performed at [facility], [address],  [place], [place] [address]    Special Requests   Final    NONE Performed at [facility], [address] [place], [place] [address]    Gram Stain   Final    FEW WBC PRESENT, PREDOMINANTLY MONONUCLEAR NO ORGANISMS SEEN    Culture   Final    NO GROWTH 2 DAYS Performed at [facility], [address], [place], [place] [address]    Report Status PENDING  Incomplete      Scheduled Meds: . bisacodyl  10 mg Rectal Daily  . enoxaparin (LOVENOX) injection  40 mg Subcutaneous Q24H  . magnesium citrate  1 Bottle Oral Once  . metoprolol succinate  50 mg Oral Daily  . nicotine  14 mg Transdermal Daily  . paliperidone  3 mg Oral Daily  . polyethylene glycol  17 g Oral BID  . tamsulosin  0.4 mg Oral Daily   Continuous Infusions: . sodium chloride 50 mL/hr at 04/05/18 0754    Assessment/Plan:  1.  Acute hypoxic respiratory failure.  Patient's pulse ox dropped down with ambulation into the 70s.  Still requires oxygen. 2.  Clinical sepsis with bibasilar pneumonia.  Finished up Levaquin.  Discontinued steroids. 3.  Left pleural effusion.  Interventional radiology only able to draw off 3 cc of blood.  Fluid culture negative.  No need for further antibiotics 4.  Urinary retention.  Started on Flomax.  In and out catheterizations.  CT scan of the abdomen showed a 3 mm stone in the right UVJ junction without hydronephrosis.  Started on some gentle IV fluids.  Urology consultation ordered. 5.  Colonic distention with constipation.  Had bowel movement after suppository.  Will give enema later.  Continue lactulose. 6.  Acute metabolic encephalopathy and delirium with auditory hallucinations.  Mental status a little bit better today.  Follow-up with psychiatry.  PRN IV Haldol.  On low-dose risperidone. 7.  Weakness.  Physical therapy evaluation.  Ambulate on room air to try to get off oxygen peer     Code Status:     Code Status Orders  (From admission, onward)         Start      Ordered   03/28/18 1136  Full code  Continuous     03/28/18 1135        Code Status History    Date Active Date Inactive Code Status Order ID Comments User Context   03/22/2018 2009 03/28/2018 1011 Full Code [id number]  Reggie Pile, MD Inpatient     Family Communication: Mother at bedside Disposition Plan: To be determined  Consultants:  Psychiatry  Time spent: 14  minutes  Ivalene Platte [employer]

## 2018-04-05 NOTE — Consult Note (Signed)
Subjective: CC: Urinary retention.  Hx:  Jeffrey Lin is a 43 yo WM who I was asked to see in consultation by Dr. Renae Gloss for a 73mm right UVJ stone and urinary retention.  The patient was admitted with acute delerium, hypoxia with sepsis and pneumonia on 3/7.   He had a foley placed on admission and it was removed on 3/12.  He has required CIC since.   He had a  CT on 3/14 and a 73mm non-obstructing right UVJ stone noted.   He denies prior stones or GU surgery.   He is medicated and unable to provide a clear history.  He denies significant pain.  He may have had some urinary hesitancy prior to admission but that is not clear.   ROS:  Review of Systems  Unable to perform ROS: Mental acuity    Allergies  Allergen Reactions  . Toradol [Ketorolac Tromethamine] Swelling  . Lamotrigine Rash    Past Medical History:  Diagnosis Date  . Anxiety   . Chronic leg pain   . Depression   . Essential hypertension, benign 10/20/2015  . Schizoaffective disorder [facility])     Past Surgical History:  Procedure Laterality Date  . Block procedure at pain center  03/27/06  . Bone graft from hip to jaw  2004  . Bone graft right side of head to jaw  2006  . 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 Sphenopalatine ganglionic      Social History   Socioeconomic History  . Marital status: Single    Spouse name: Not on file  . Number of children: 0  . Years of education: 21  . Highest education level: Not on file  Occupational History  . Occupation: Marketing executive    Employer: [employer]  Social Needs  . Financial resource strain: Not on file  . Food insecurity:    Worry: Not on file    Inability: Not on file  . Transportation needs:    Medical: Not on file    Non-medical: Not on file  Tobacco Use  . Smoking status: Current Every Day Smoker    Packs/day: 1.00    Years: 23.00    Pack years: 23.00    Types: Cigarettes    Start date: 09/22/1995  . Smokeless tobacco: Former  Engineer, water and Sexual Activity  . Alcohol use: No    Alcohol/week: 0.0 standard drinks  . Drug use: No  . Sexual activity: Not Currently  Lifestyle  . Physical activity:    Days per week: Not on file    Minutes per session: Not on file  . Stress: Not on file  Relationships  . Social connections:    Talks on phone: Not on file    Gets together: Not on file    Attends religious service: Not on file    Active member of club or organization: Not on file    Attends meetings of clubs or organizations: Not on file    Relationship status: Not on file  . Intimate partner violence:    Fear of current or ex partner: Not on file    Emotionally abused: Not on file    Physically abused: Not on file    Forced sexual activity: Not on file  Other Topics Concern  . Not on file  Social History Narrative      Exercise:  None, running causes head pain   Diet:  FF once daily, +  fruits and veggies, + H2O, + Soda   Lives with mom in a one story home.  No children.  Currently not working.  Trying to get disability.  Education: college.     Family History  Problem Relation Age of Onset  . Hyperlipidemia Mother   . Hypertension Mother   . Depression Mother   . Anxiety disorder Mother   . Depression Brother   . Anxiety disorder Brother   . Bipolar disorder Maternal Grandfather   . Schizophrenia Maternal Grandfather   . Bipolar disorder Paternal Grandfather     Anti-infectives: Anti-infectives (From admission, onward)   Start     Dose/Rate Route Frequency Ordered Stop   04/01/18 1400  levofloxacin (LEVAQUIN) tablet 750 mg     750 mg Oral Daily 03/31/18 1440 04/03/18 1458   03/28/18 1400  levofloxacin (LEVAQUIN) IVPB 750 mg     750 mg 100 mL/hr over 90 Minutes Intravenous Every 24 hours 03/28/18 1241 03/31/18 1536      Current Facility-Administered Medications  Medication Dose Route Frequency Provider Last Rate Last Dose  . 0.9 %  sodium chloride infusion   Intravenous Continuous  Alford HighlandWieting, Richard, MD 50 mL/hr at 04/05/18 0754    . acetaminophen (TYLENOL) tablet 650 mg  650 mg Oral Q6H PRN Milagros LollSudini, Srikar, MD   650 mg at 03/30/18 [id number]   Or  . acetaminophen (TYLENOL) suppository 650 mg  650 mg Rectal Q6H PRN Sudini, Wardell HeathSrikar, MD      . albuterol (PROVENTIL) (2.5 MG/3ML) 0.083% nebulizer solution 2.5 mg  2.5 mg Nebulization Q2H PRN Sudini, Srikar, MD      . bisacodyl (DULCOLAX) suppository 10 mg  10 mg Rectal Daily Alford HighlandWieting, Richard, MD   10 mg at 04/05/18 0740  . diphenhydrAMINE (BENADRYL) capsule 25 mg  25 mg Oral Q6H PRN Alford HighlandWieting, Richard, MD   25 mg at 04/04/18 1235  . enoxaparin (LOVENOX) injection 40 mg  40 mg Subcutaneous Q24H Wieting, Richard, MD   40 mg at 04/05/18 1244  . haloperidol lactate (HALDOL) injection 1 mg  1 mg Intravenous Q4H PRN Clapacs, Jackquline DenmarkJohn T, MD   1 mg at 04/05/18 1527  . metoprolol succinate (TOPROL-XL) 24 hr tablet 50 mg  50 mg Oral Daily Milagros LollSudini, Srikar, MD   50 mg at 04/05/18 1243  . nicotine (NICODERM CQ - dosed in mg/24 hours) patch 14 mg  14 mg Transdermal Daily Milagros LollSudini, Srikar, MD   14 mg at 04/05/18 1244  . ondansetron (ZOFRAN) tablet 4 mg  4 mg Oral Q6H PRN Milagros LollSudini, Srikar, MD       Or  . ondansetron (ZOFRAN) injection 4 mg  4 mg Intravenous Q6H PRN Milagros LollSudini, Srikar, MD      . Melene Muller[START ON 04/06/2018] paliperidone (INVEGA) 24 hr tablet 6 mg  6 mg Oral Daily Katheran AweStephens, Wayland C, MD      . polyethylene glycol (MIRALAX / GLYCOLAX) packet 17 g  17 g Oral BID Vanga, Loel Dubonnetohini Reddy, MD      . sodium phosphate (FLEET) 7-19 GM/118ML enema 1 enema  1 enema Rectal QHS Renae GlossWieting, Richard, MD      . tamsulosin [facility](FLOMAX) capsule 0.4 mg  0.4 mg Oral Daily Milagros LollSudini, Srikar, MD   0.4 mg at 04/05/18 1243     Objective: Vital signs in last 24 hours: Temp:  [98.4 F (36.9 C)-98.7 F (37.1 C)] 98.7 F (37.1 C) (03/15 0555) Pulse Rate:  [94-104] 94 (03/15 0555) Resp:  [20] 20 (03/15 0555) BP: (105-126)/(72-86) 105/72 (  03/15 0555) SpO2:  [93 %-95 %] 95 % (03/15  0555) Weight:  [98.9 kg] 98.9 kg (03/15 0430)  Intake/Output from previous day: 03/14 0701 - 03/15 0700 In: 120 [P.O.:120] Out: 965 [Urine:965] Intake/Output this shift: Total I/O In: -  Out: 400 [Urine:400]   Physical Exam Constitutional:      Comments: WD, WN, heavily medicated but somewhat alert.   HENT:     Head: Normocephalic and atraumatic.  Neck:     Musculoskeletal: Normal range of motion and neck supple.  Cardiovascular:     Rate and Rhythm: Normal rate.     Heart sounds: Normal heart sounds.  Pulmonary:     Effort: Pulmonary effort is normal.     Comments: Decreased BS with poor effort.  Abdominal:     Palpations: Abdomen is soft. There is no mass.     Tenderness: There is right CVA tenderness (mild).  Genitourinary:    Penis: Normal.      Scrotum/Testes: Normal.     Prostate: Normal.     Rectum: Normal.     Comments: Prostate is small and benign.  Musculoskeletal: Normal range of motion.        General: No swelling or tenderness.  Lymphadenopathy:     Cervical: No cervical adenopathy.  Skin:    General: Skin is warm and dry.  Neurological:     General: No focal deficit present.     Mental Status: He is oriented to person, place, and time.  Psychiatric:        Mood and Affect: Mood normal.        Behavior: Behavior normal.     Lab Results:  Recent Labs    04/03/18 0507 04/04/18 0631  WBC 11.2* 10.4  HGB 14.4 14.5  HCT 43.5 44.2  PLT 305 277   BMET Recent Labs    04/04/18 0631  NA 139  K 3.6  CL 101  CO2 30  GLUCOSE 95  BUN 28*  CREATININE 1.06  CALCIUM 9.1   PT/INR No results for input(s): LABPROT, INR in the last 72 hours. ABG No results for input(s): PHART, HCO3 in the last 72 hours.  Invalid input(s): PCO2, PO2  Studies/Results: Ct Abdomen Pelvis W Contrast  Result Date: 04/04/2018 CLINICAL DATA:  43 year old male with acute abdominal and pelvic pain with abdominal distention. EXAM: CT ABDOMEN AND PELVIS WITH CONTRAST  TECHNIQUE: Multidetector CT imaging of the abdomen and pelvis was performed using the standard protocol following bolus administration of intravenous contrast. CONTRAST:  OMNIPAQUE IOHEXOL 300 MG/ML  SOLN COMPARISON:  None. FINDINGS: Lower chest: Mild bibasilar atelectasis noted. Hepatobiliary: The liver and gallbladder are unremarkable. No biliary dilatation. 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 There is a 3 mm RIGHT UVJ calculus without hydronephrosis. No other urinary abnormalities are identified. The adrenal glands are unremarkable. Stomach/Bowel: Moderate gaseous distention of the colon with moderate stool noted likely representing an ileus. Appendix is normal. No bowel obstruction, definite bowel wall thickening or inflammatory changes. Vascular/Lymphatic: Aortic atherosclerosis. No enlarged abdominal or pelvic lymph nodes. Reproductive: Prostate is unremarkable. Other: No ascites, abscess or pneumoperitoneum. Musculoskeletal: No acute or suspicious bony abnormality. A RIGHT L5 pars defect is noted without spondylolisthesis. IMPRESSION: 1. Moderate colonic distension likely representing colonic ileus. No evidence of bowel obstruction or pneumoperitoneum. 2. 3 mm  RIGHT UVJ calculus without hydronephrosis. 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 3. Mild bibasilar atelectasis. 4.  Aortic Atherosclerosis (ICD10-I70.0). Electronically Signed   By: Harmon Pier M.D.   On: 04/04/2018 14:28   I have reviewed the pertinent labs, imaging and notes.    Assessment: 1. Urinary retention of uncertain etiology.   Place foley and continue tamsulosin.   He will need f/u in the office in a week for a voiding  trial.  2. Right distal ureteral stone.  The stone is 3 mm at the UVJ and has a high probability of passing.   Continue tamsulosin and strain urine.   KUB prior to f/u visit.    CC: Dr. Alford Highland.      Jeffrey Lin 04/05/2018 [phone]

## 2018-04-05 NOTE — Consult Note (Signed)
Cephas Darby, MD [address]  [place]  [place], [place] [address]  Main: [phone]  Fax: [phone] Pager: [phone]   Consultation  Referring Provider:     No ref. provider found Primary Care Physician:  Jinny Sanders, MD Primary Gastroenterologist: None         Reason for Consultation:     Abdominal distention  Date of Admission:  03/28/2018 Date of Consultation:  04/05/2018         HPI:   Jeffrey Lin is a 43 y.o. male with history of schizoaffective disorder, hypertension, depression who was initially admitted to behavioral health on 03/22/2018, later admitted to medicine floor on 03/28/2018 secondary to hypoxic respiratory failure, secondary to left parapneumonic effusion complicated by hemothorax status post thoracentesis on 04/03/2018. He is being treated for it and slowly improving. He has worsening abdominal distension, underwent CT abdomen yesterday which revealed moderate colonic stool burden with colonic ileus with no evidence of obstruction or perforation.  He reported that his last bowel movement was about 2 to 3 weeks ago and generally suffers from chronic constipation probably secondary to his psychiatric medications.  He has been receiving lactulose 30 g twice daily, finally had a bowel movement this morning.  He has a Actuary who told me that it was large hard balls of stools, moderate amount. He is tolerating clear liquid diet, sitting in a chair.  He reports abdominal discomfort but feels softer when he pushes compared to few days ago.  Patient denies rectal bleeding, melena, hematochezia.  He denies family history of GI malignancy including colon cancer Hemoglobin normal during entire stay of this hospitalization   Past Medical History:  Diagnosis Date  . Anxiety   . Chronic leg pain   . Depression   . Essential hypertension, benign 10/20/2015  . Schizoaffective disorder [facility])     Past Surgical History:  Procedure Laterality Date  . Block  procedure at pain center  03/27/06  . Bone graft from hip to jaw  2004  . Bone graft right side of head to jaw  2006  . 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 Sphenopalatine ganglionic      Prior to Admission medications   Medication Sig Start Date End Date Taking? Authorizing Provider  amantadine (SYMMETREL) 100 MG capsule Take 100 mg by mouth 2 (two) times daily. 08/06/15  Yes [provider]  amphetamine-dextroamphetamine (ADDERALL) 20 MG tablet Take 20 mg by mouth 3 (three) times daily. 12/07/17  Yes [provider]  atorvastatin (LIPITOR) 40 MG tablet TAKE 1 TABLET BY MOUTH EVERY DAY 04/28/17  Yes Bedsole, Amy E, MD  benztropine (COGENTIN) 1 MG tablet Take 1 mg by mouth at bedtime.  08/28/15  Yes [provider]  diazepam (VALIUM) 10 MG tablet Take 10 mg by mouth 4 (four) times daily.  09/12/15  Yes [provider]  FLUoxetine (PROZAC) 40 MG capsule Take 40 mg by mouth daily. 02/27/17  Yes [provider]  furosemide (LASIX) 20 MG tablet TAKE 1 TABLET BY MOUTH EVERY DAY 10/30/17  Yes Bedsole, Amy E, MD  gabapentin (NEURONTIN) 800 MG tablet Take 800 mg three x a day. 03/17/18  Yes Patel, Donika K, DO  hydrOXYzine (VISTARIL) 25 MG capsule TAKE 1 CAPSULE BY MOUTH 4 TIMES A DAY 08/02/15  Yes [provider]  losartan (COZAAR) 100 MG tablet Take 1 tablet (100 mg total) by mouth daily. Please schedule physical 03/05/18  Yes Bedsole, Amy E, MD  OLANZapine (ZYPREXA) 10 MG tablet TAKE 1 TABLET BY MOUTH AT BEDTIME (REPLACES SYMBYAX) 02/14/16  Yes [provider]  omeprazole (PRILOSEC) 20 MG capsule Take 20 mg by mouth daily as needed.   Yes [provider]  potassium chloride (K-DUR) 10 MEQ tablet TAKE 1 TABLET BY MOUTH EVERY DAY 02/11/18  Yes Bedsole, Amy E, MD  Vitamin D, Ergocalciferol, (DRISDOL) 50000 units CAPS capsule Take 1 capsule (50,000 Units total) by mouth 2 (two) times a week. 07/01/16  Yes Tonia Ghent, MD   methocarbamol (ROBAXIN) 750 MG tablet TAKE 1 TABLET (750 MG TOTAL) BY MOUTH EVERY 12 (TWELVE) HOURS AS NEEDED FOR MUSCLE SPASMS. Patient not taking: Reported on 04/02/2018 02/02/18   Jinny Sanders, MD  metoprolol succinate (TOPROL-XL) 50 MG 24 hr tablet Take 1 tablet (50 mg total) by mouth daily. Take with or immediately following a meal. Patient not taking: Reported on 04/02/2018 04/12/16   Jinny Sanders, MD   Current Facility-Administered Medications:  .  0.9 %  sodium chloride infusion, , Intravenous, Continuous, Wieting, Richard, MD, Last Rate: 50 mL/hr at 04/05/18 0754 .  acetaminophen (TYLENOL) tablet 650 mg, 650 mg, Oral, Q6H PRN, 650 mg at 03/30/18 0954 **OR** acetaminophen (TYLENOL) suppository 650 mg, 650 mg, Rectal, Q6H PRN, Sudini, Srikar, MD .  albuterol (PROVENTIL) (2.5 MG/3ML) 0.083% nebulizer solution 2.5 mg, 2.5 mg, Nebulization, Q2H PRN, Sudini, Srikar, MD .  bisacodyl (DULCOLAX) suppository 10 mg, 10 mg, Rectal, Daily, Leslye Peer, Richard, MD, 10 mg at 04/05/18 0740 .  diphenhydrAMINE (BENADRYL) capsule 25 mg, 25 mg, Oral, Q6H PRN, Loletha Grayer, MD, 25 mg at 04/04/18 1235 .  enoxaparin (LOVENOX) injection 40 mg, 40 mg, Subcutaneous, Q24H, Wieting, Richard, MD, 40 mg at 04/05/18 1244 .  haloperidol lactate (HALDOL) injection 1 mg, 1 mg, Intravenous, Q4H PRN, Clapacs, John T, MD, 1 mg at 04/05/18 1527 .  metoprolol succinate (TOPROL-XL) 24 hr tablet 50 mg, 50 mg, Oral, Daily, Sudini, Srikar, MD, 50 mg at 04/05/18 1243 .  nicotine (NICODERM CQ - dosed in mg/24 hours) patch 14 mg, 14 mg, Transdermal, Daily, Sudini, Srikar, MD, 14 mg at 04/05/18 1244 .  ondansetron (ZOFRAN) tablet 4 mg, 4 mg, Oral, Q6H PRN **OR** ondansetron (ZOFRAN) injection 4 mg, 4 mg, Intravenous, Q6H PRN, Sudini, Srikar, MD .  paliperidone (INVEGA) 24 hr tablet 3 mg, 3 mg, Oral, Daily, Charlett Nose, RPH, 3 mg at 04/05/18 1521 .  polyethylene glycol (MIRALAX / GLYCOLAX) packet 17 g, 17 g, Oral, BID, Vanga,  Tally Due, MD .  sodium phosphate (FLEET) 7-19 GM/118ML enema 1 enema, 1 enema, Rectal, QHS, Wieting, Richard, MD .  tamsulosin [facility]) capsule 0.4 mg, 0.4 mg, Oral, Daily, Sudini, Srikar, MD, 0.4 mg at 04/05/18 1243   Family History  Problem Relation Age of Onset  . Hyperlipidemia Mother   . Hypertension Mother   . Depression Mother   . Anxiety disorder Mother   . Depression Brother   . Anxiety disorder Brother   . Bipolar disorder Maternal Grandfather   . Schizophrenia Maternal Grandfather   . Bipolar disorder Paternal Grandfather      Social History   Tobacco Use  . Smoking status: Current Every Day Smoker    Packs/day: 1.00    Years: 23.00    Pack years: 23.00    Types: Cigarettes    Start date: 09/22/1995  . Smokeless tobacco: Former Network engineer Use Topics  . Alcohol use: No  Alcohol/week: 0.0 standard drinks  . Drug use: No    Allergies as of 03/28/2018 - Review Complete 03/28/2018  Allergen Reaction Noted  . Toradol [ketorolac tromethamine] Swelling 08/11/2015  . Lamotrigine Rash 09/29/2015    Review of Systems:    All systems reviewed and negative except where noted in HPI.   Physical Exam:  Vital signs in last 24 hours: Temp:  [98.4 F (36.9 C)-98.7 F (37.1 C)] 98.7 F (37.1 C) (03/15 0555) Pulse Rate:  [94-104] 94 (03/15 0555) Resp:  [20] 20 (03/15 0555) BP: (105-126)/(72-86) 105/72 (03/15 0555) SpO2:  [93 %-95 %] 95 % (03/15 0555) Weight:  [98.9 kg] 98.9 kg (03/15 0430) Last BM Date: (pt unsure) General:   Pleasant, cooperative in NAD Head:  Normocephalic and atraumatic. Eyes:   No icterus.   Conjunctiva pink. PERRLA. Ears:  Normal auditory acuity. Neck:  Supple; no masses or thyroidomegaly Lungs: Respirations even and unlabored. Lungs clear to auscultation bilaterally.   No wheezes, crackles, or rhonchi.  Heart:  Regular rate and rhythm;  Without murmur, clicks, rubs or gallops Abdomen:  Soft, moderately distended, tympanic to  percussion, nontender. Normal bowel sounds. No appreciable masses or hepatomegaly.  No rebound or guarding.  Rectal:  Not performed. Msk:  Symmetrical without gross deformities.  Strength generalized weakness Extremities:  Without edema, cyanosis or clubbing. Neurologic:  Alert and oriented x3;  grossly normal neurologically. Skin:  Intact without significant lesions or rashes. Cervical Nodes:  No significant cervical adenopathy. Psych:  Alert and cooperative. Normal affect.  LAB RESULTS: CBC Latest Ref Rng & Units 04/04/2018 04/03/2018 03/29/2018  WBC 4.0 - 10.5 K/uL 10.4 11.2(H) 9.6  Hemoglobin 13.0 - 17.0 g/dL 14.5 14.4 13.8  Hematocrit 39.0 - 52.0 % 44.2 43.5 43.1  Platelets 150 - 400 K/uL 277 305 235    BMET BMP Latest Ref Rng & Units 04/04/2018 04/01/2018 03/29/2018  Glucose 70 - 99 mg/dL 95 - 100(H)  BUN 6 - 20 mg/dL 28(H) - 17  Creatinine 0.61 - 1.24 mg/dL 1.06 0.88 1.02  Sodium 135 - 145 mmol/L 139 - 137  Potassium 3.5 - 5.1 mmol/L 3.6 - 3.6  Chloride 98 - 111 mmol/L 101 - 92(L)  CO2 22 - 32 mmol/L 30 - 35(H)  Calcium 8.9 - 10.3 mg/dL 9.1 - 9.2    LFT Hepatic Function Latest Ref Rng & Units 03/28/2018 03/22/2018 05/14/2017  Total Protein 6.5 - 8.1 g/dL 6.8 7.0 -  Albumin 3.5 - 5.0 g/dL 3.5 4.1 4.5  AST 15 - 41 U/L 25 20 -  ALT 0 - 44 U/L 24 26 -  Alk Phosphatase 38 - 126 U/L 109 108 -  Total Bilirubin 0.3 - 1.2 mg/dL 0.8 0.9 -  Bilirubin, Direct <=0.2 mg/dL - - -     STUDIES: Ct Abdomen Pelvis W Contrast  Result Date: 04/04/2018 CLINICAL DATA:  43 year old male with acute abdominal and pelvic pain with abdominal distention. EXAM: CT ABDOMEN AND PELVIS WITH CONTRAST TECHNIQUE: Multidetector CT imaging of the abdomen and pelvis was performed using the standard protocol following bolus administration of intravenous contrast. CONTRAST:  138m OMNIPAQUE IOHEXOL 300 MG/ML  SOLN COMPARISON:  None. FINDINGS: Lower chest: Mild bibasilar atelectasis noted. Hepatobiliary: The liver and  gallbladder are unremarkable. No biliary dilatation. 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 There is a  3 mm RIGHT UVJ calculus without hydronephrosis. No other urinary abnormalities are identified. The adrenal glands are unremarkable. Stomach/Bowel: Moderate gaseous distention of the colon with moderate stool noted likely representing an ileus. Appendix is normal. No bowel obstruction, definite bowel wall thickening or inflammatory changes. Vascular/Lymphatic: Aortic atherosclerosis. No enlarged abdominal or pelvic lymph nodes. Reproductive: Prostate is unremarkable. Other: No ascites, abscess or pneumoperitoneum. Musculoskeletal: No acute or suspicious bony abnormality. A RIGHT L5 pars defect is noted without spondylolisthesis. IMPRESSION: 1. Moderate colonic distension likely representing colonic ileus. No evidence of bowel obstruction or pneumoperitoneum. 2. 3 mm RIGHT UVJ calculus without hydronephrosis. 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 3. Mild bibasilar atelectasis. 4.  Aortic Atherosclerosis (ICD10-I70.0). Electronically Signed   By: Margarette Canada M.D.   On: 04/04/2018 14:28      Impression / Plan:   KAIS MONJE is a 43 y.o. male with with history of schizoaffective disorder, depression who was initially admitted to behavioral medicine about 2 weeks ago, later transferred to internal medicine secondary to hypoxic respiratory failure found to have left parapneumonic effusion, hemothorax.  He also has worsening abdominal distention which he is secondary to severe constipation, CT abdomen and pelvis is reassuring Recommend to manage as severe constipation at this time.  No endoscopy intervention is  required at this time Patient had one hard bowel movement today Aggressive bowel regimen Discontinue lactulose as it can worsen ileus Check electrolytes including potassium, magnesium and phosphorus, replete as needed Magnesium titrate x1 ordered MiraLAX 17 g twice daily Fleet enema as needed Encourage patient to drink plenty of water, MiraLAX twice daily and incorporate more fiber in his diet upon discharge Advance diet as tolerated  Thank you for involving me in the care of this patient.  Will sign off at this time, please call GI back with questions or concerns    LOS: 8 days   Sherri Sear, MD  04/05/2018, 3:34 PM   Note: This dictation was prepared with Dragon dictation along with smaller phrase technology. Any transcriptional errors that result from this process are unintentional.

## 2018-04-06 ENCOUNTER — Inpatient Hospital Stay: Payer: PPO

## 2018-04-06 LAB — BODY FLUID CULTURE: Culture: NO GROWTH

## 2018-04-06 LAB — PH, BODY FLUID: pH, Body Fluid: 8

## 2018-04-06 MED ORDER — FLUTICASONE PROPIONATE 50 MCG/ACT NA SUSP
2.0000 | Freq: Every day | NASAL | Status: DC
  Administered 2018-04-06 – 2018-04-08 (×3): 2 via NASAL
  Filled 2018-04-06: qty 16

## 2018-04-06 NOTE — Progress Notes (Signed)
Pt did not get enema as he was asleep, however he had a BM during the morning.

## 2018-04-06 NOTE — Progress Notes (Signed)
Pt refusing Bipap.

## 2018-04-06 NOTE — Progress Notes (Signed)
Physical Therapy Treatment Patient Details Name: Jeffrey Lin MRN: [id number] DOB: January 05, 1976 Today's Date: 04/06/2018    History of Present Illness From MD H&P: Pt is a 43 yo male with schizoaffective disorder and a history of chronic neurologic problems was brought to the emergency room by his mother because of altered mental status.  Mother reported that the patient had had altered mental status for several days appearing to be responding to internal stimuli and being confused.  On presentation to the emergency room he was not able to give lucid history to the doctor on call.  Patient denied having overdosed on anything.  Pt was a very poor historian.  He slurred his speech and could make out only a few words very slowly of what he said.  The patient himself denied having run out of anything or having any withdrawal.  Patient was then transferred out of behavioral med unit due to respiratory difficulty.     PT Comments    Patient up in chair with mother and sitter in room, agreeable to PT. With RN consent, pt placed on room air. Patient able to follow one step commands with extended time this session, behavior WFLs. Patient able to transfer without AD, extensive use of hands. Patient exhibited deficits in initial standing balance, and mild LOB with standing marching. Safety/balance much improved with RW.Patient ambulated >172ft with RW and CGA-minAx1. Occasional minA due to multiple LOB during ambulation, pt ambulated with very narrow base of support, educated about balance strategies and proper gait mechanics. The patient would benefit from further skilled PT to continue to improve functional abilities/mobility. Discharge recommendation updated to HHPT with 24/7 supervision.  spO2 monitored throughout session, pt on room air with transfers and ambulation. 2-3 instances of desatting to 88%, able to recover within seconds with cues for standing rest breaks and PLB >90%. RN aware.     Follow Up  Recommendations  Home health PT;Supervision/Assistance - 24 hour     Equipment Recommendations  Rolling walker with 5" wheels    Recommendations for Other Services       Precautions / Restrictions Precautions Precautions: Fall Restrictions Weight Bearing Restrictions: No    Mobility  Bed Mobility               General bed mobility comments: deferred pt up in recliner  Transfers Overall transfer level: Needs assistance Equipment used: Rolling walker (2 wheeled) Transfers: Sit to/from Stand Sit to Stand: Min guard         General transfer comment: Able to stand without RW, some instability noted. Performed with RW with much improved intitial standing balance.  Ambulation/Gait Ambulation/Gait assistance: +2 safety/equipment;Min guard;Min assist   Assistive device: Rolling walker (2 wheeled)   Gait velocity: decreased   General Gait Details: occasional minA due to LOB during ambulation, pt ambulates with very narrow base of support, educated about balance strategies.   Stairs             Wheelchair Mobility    Modified Rankin (Stroke Patients Only)       Balance   Sitting-balance support: Feet supported Sitting balance-Leahy Scale: Fair     Standing balance support: Bilateral upper extremity supported Standing balance-Leahy Scale: Poor Standing balance comment: increased sway noted without RW, improved balance with bilateral UE support, occasional minA for LOB                            Cognition Arousal/Alertness:  Awake/alert Behavior During Therapy: Flat affect Overall Cognitive Status: Impaired/Different from baseline Area of Impairment: Safety/judgement                     Memory: Decreased short-term memory Following Commands: Follows one step commands with increased time Safety/Judgement: Decreased awareness of safety   Problem Solving: Slow processing;Requires verbal cues        Exercises      General  Comments        Pertinent Vitals/Pain Pain Assessment: No/denies pain    Home Living                      Prior Function            PT Goals (current goals can now be found in the care plan section) Progress towards PT goals: Progressing toward goals    Frequency    Min 2X/week      PT Plan Discharge plan needs to be updated    Co-evaluation              AM-PAC PT "6 Clicks" Mobility   Outcome Measure  Help needed turning from your back to your side while in a flat bed without using bedrails?: A Little Help needed moving from lying on your back to sitting on the side of a flat bed without using bedrails?: A Little Help needed moving to and from a bed to a chair (including a wheelchair)?: A Little Help needed standing up from a chair using your arms (e.g., wheelchair or bedside chair)?: A Little Help needed to walk in hospital room?: A Little Help needed climbing 3-5 steps with a railing? : A Lot 6 Click Score: 17    End of Session Equipment Utilized During Treatment: Gait belt Activity Tolerance: Patient tolerated treatment well Patient left: in chair;with nursing/sitter in room;with family/visitor present;with call bell/phone within reach Nurse Communication: Mobility status;Other (comment)(oxygen status) PT Visit Diagnosis: Other abnormalities of gait and mobility (R26.89);Muscle weakness (generalized) (M62.81);Unsteadiness on feet (R26.81);Ataxic gait (R26.0)     Time: 1010-1043 PT Time Calculation (min) (ACUTE ONLY): 33 min  Charges:  $Gait Training: 8-22 mins $Therapeutic Activity: 8-22 mins                     Olga Coaster PT, DPT 11:02 AM,04/06/18 [phone]

## 2018-04-06 NOTE — Progress Notes (Signed)
MD notified: The patient was wean off oxygen. He ambulated in the hallway with the physical therapist. He would desat the lowest at 88% yet with rest he would recover. Oxygen stayed at 91% while ambulating most of the time. He still needs one person assist to ambulate. Sitting down oxygen is >93%. If he will be transfer down to psych he is not able to go down with a foley catheter. Urologist note indicate they want him to keep the foley.

## 2018-04-06 NOTE — Progress Notes (Signed)
Patient ID: Jeffrey Lin, male   DOB: 1975-12-02, 43 y.o.   MRN: [id number]  [facility] PROGRESS NOTE  TUSHAR DENNEE HQR:[id number] DOB: 03-Sep-1975 DOA: 03/28/2018 PCP: Excell Seltzer, MD  HPI/Subjective: Patient feeling a little bit better.  Breathing a little bit better.  Had plenty of bowel movements yesterday and overnight.  No abdominal pain.  Foley catheter placed by urology.  Patient with sniffling with his nose probably secondary to the oxygen  Objective: Vitals:   04/06/18 1231 04/06/18 1318  BP:    Pulse:    Resp:    Temp:    SpO2: 96% 97%    Filed Weights   04/04/18 0657 04/05/18 0430 04/06/18 0500  Weight: 97.8 kg 98.9 kg 99.9 kg    ROS: Review of Systems  Constitutional: Negative for chills and fever.  Eyes: Negative for blurred vision.  Respiratory: Positive for cough and shortness of breath.   Cardiovascular: Negative for chest pain.  Gastrointestinal: Negative for abdominal pain, constipation, diarrhea, nausea and vomiting.  Musculoskeletal: Negative for joint pain.  Neurological: Negative for dizziness and headaches.   Exam: Physical Exam  HENT:  Nose: No mucosal edema.  Mouth/Throat: No oropharyngeal exudate or posterior oropharyngeal edema.  Eyes: Pupils are equal, round, and reactive to light. Conjunctivae, EOM and lids are normal.  Neck: No JVD present. Carotid bruit is not present. No edema present. No thyroid mass and no thyromegaly present.  Cardiovascular: S1 normal and S2 normal. Exam reveals no gallop.  No murmur heard. Pulses:      Dorsalis pedis pulses are 2+ on the right side and 2+ on the left side.  Respiratory: No respiratory distress. He has decreased breath sounds in the left lower field. He has no wheezes. He has no rhonchi. He has no rales.  GI: Soft. Bowel sounds are normal. There is no abdominal tenderness.  Musculoskeletal:     Right ankle: He exhibits no swelling.     Left ankle: He exhibits no swelling.  Lymphadenopathy:     He has no cervical adenopathy.  Neurological: He is alert.  Patient moves all extremities without a problem.  Skin: Skin is warm. No rash noted. Nails show no clubbing.  Psychiatric:  Patient answers some questions appropriately and other questions not appropriately.      Data Reviewed: Basic Metabolic Panel: Recent Labs  Lab 04/01/18 0358 04/04/18 0631 04/05/18 1428  NA  --  139  --   K  --  3.6  --   CL  --  101  --   CO2  --  30  --   GLUCOSE  --  95  --   BUN  --  28*  --   CREATININE 0.88 1.06  --   CALCIUM  --  9.1  --   MG  --   --  2.3  PHOS  --   --  3.5  CBC: Recent Labs  Lab 04/03/18 0507 04/04/18 0631  WBC 11.2* 10.4  HGB 14.4 14.5  HCT 43.5 44.2  MCV 84.5 84.4  PLT 305 277   BNP (last 3 results) Recent Labs    03/28/18 0931  BNP 49.0      Recent Results (from the past 240 hour(s))  Culture, blood (routine x 2) Call MD if unable to obtain prior to antibiotics being given     Status: None   Collection Time: 03/28/18  1:53 PM  Result Value Ref Range Status   Specimen Description  BLOOD LEFT ANTECUBITAL  Final   Special Requests   Final    BOTTLES DRAWN AEROBIC AND ANAEROBIC Blood Culture adequate volume   Culture   Final    NO GROWTH 5 DAYS Performed at [facility], [address] [place], [place] [address]    Report Status 04/02/2018 FINAL  Final  Culture, blood (routine x 2) Call MD if unable to obtain prior to antibiotics being given     Status: None   Collection Time: 03/28/18  1:53 PM  Result Value Ref Range Status   Specimen Description BLOOD RIGHT ANTECUBITAL  Final   Special Requests   Final    BOTTLES DRAWN AEROBIC AND ANAEROBIC Blood Culture adequate volume   Culture   Final    NO GROWTH 5 DAYS Performed at [facility], [address] [place], [place] [address]    Report Status 04/02/2018 FINAL  Final  Body fluid culture     Status: None   Collection Time: 04/03/18  2:26 PM  Result Value Ref Range  Status   Specimen Description   Final    PLEURAL Performed at [facility], [address], [place], [place] [address]    Special Requests   Final    NONE Performed at [facility], [address] [place], [place] [address]    Gram Stain   Final    FEW WBC PRESENT, PREDOMINANTLY MONONUCLEAR NO ORGANISMS SEEN    Culture   Final    NO GROWTH 3 DAYS Performed at [facility], [address], [place], [place] [address]    Report Status 04/06/2018 FINAL  Final      Scheduled Meds: . bisacodyl  10 mg Rectal Daily  . enoxaparin (LOVENOX) injection  40 mg Subcutaneous Q24H  . fluticasone  2 spray Each Nare Daily  . metoprolol succinate  50 mg Oral Daily  . nicotine  14 mg Transdermal Daily  . paliperidone  6 mg Oral Daily  . polyethylene glycol  17 g Oral BID  . sodium phosphate  1 enema Rectal QHS  . tamsulosin  0.4 mg Oral Daily   Continuous Infusions: . sodium chloride 50 mL/hr at 04/06/18 0452    Assessment/Plan:  1.  Acute hypoxic respiratory failure.  Patient able to come off the oxygen today.  Held saturations with ambulation. 2.  Clinical sepsis with bibasilar pneumonia.  Finished up Levaquin.  Discontinued steroids. 3.  Left pleural effusion.  Interventional radiology only able to draw off 3 cc of blood.  Fluid culture negative.  No need for further antibiotics 4.  Urinary retention.  Started on Flomax.  CT scan of the abdomen showed a 3 mm stone in the right UVJ junction without hydronephrosis.  Started on some gentle IV fluids.  Urology placed Foley catheter.  On the psychiatry floor they were unable to take patients with Foley catheters.  They will check to see if they are able to do in and out catheterizations. 5.  Colonic distention with constipation.  Patient had quite a few bowel movements.  Will continue MiraLAX and make other medications as needed. 6.  Acute metabolic encephalopathy and delirium with auditory hallucinations.  Mental  status better today.  On Invega. 7.  Weakness.  Continued physical therapy evaluation.     Code Status:     Code Status Orders  (From admission, onward)         Start     Ordered   03/28/18 1136  Full code  Continuous     03/28/18 1135        Code Status History    Date Active Date Inactive Code Status Order ID Comments User Context   03/22/2018 2009 03/28/2018 1011 Full Code [id number]  Reggie Pile, MD Inpatient     Family Communication: Mother at bedside Disposition Plan: To be determined  Consultants:  Psychiatry  Time spent: 27 minutes  Katena Petitjean [employer]

## 2018-04-06 NOTE — Consult Note (Signed)
[facility] Face-to-Face Psychiatry Consult   Reason for Consult: Follow-up consult for this 43 year old man with a history of schizophrenia or schizoaffective disorder who is currently suffering from delirium and confusion while recovering from pneumonia. Referring Physician: Fonnie Birkenhead Patient Identification: Jeffrey Lin MRN:  [id number] Principal Diagnosis: Subacute delirium Diagnosis:  Principal Problem:   Subacute delirium Active Problems:   Schizoaffective disorder (HCC)   Pneumonia   Total Time spent with patient: 30 minutes  Subjective:   Jeffrey Lin is a 43 y.o. male patient transferred to medical floor from Behavioral health on 03/28/2018 with pneumonia, acute hypoxic respiratory failure, COPD, delirium, history of schizoaffective disorder, and history of polysubstance use disorders.  He was transferred to the medical floor on 03/28/2018 after being found hypoxic.   HPI: Patient was seen chart reviewed.  Patient was asleep upon entry to room; sitter at bedside.  Pt easily awakened to verbal and light tactile stimuli (tapping of leg).  Patient reports he's feeling better today. Per sitter and RN pt has been having a pretty good day.  No agitation this morning.  He's been up ambulating with staff assistance and sitting up in chair.  At time of evaluation, pt was alerted to person and place, but not to time (pt stated it was April 01, 2012).  Pt provided with correct date--will ask him tomorrow to see if he remembers.    Pt with schizoaffective disorder history and substance abuse complications at times of polypharmacy and of withdrawal.  03/28/2018--pt developed hypoxia from pneumonia.  Transferred to medicine.  Continued course has been complicated by frequent agitation and hallucinations, but was seemingly showing signs of improvement over past day.   His agitation waxes and wanes. Pt denied hallucinations at time of today's assessment, but about 1 hour after I saw him, pt's RN reports pt  awakened and was having hallucinations.  Pt received IV haldol 1 mg.  Pt is currently on oral Invega 6 mg daily- (increased from 3 mg to 6 mg today, 04/06/2018).    Past Psychiatric History: Long history of schizoaffective disorder complicated by neurologic problems and substance abuse and polypharmacy.  Risk to Self:   Risk to Others:   Prior Inpatient Therapy:   Prior Outpatient Therapy:    Past Medical History:  Past Medical History:  Diagnosis Date  . Anxiety   . Chronic leg pain   . Depression   . Essential hypertension, benign 10/20/2015  . Schizoaffective disorder [facility])     Past Surgical History:  Procedure Laterality Date  . Block procedure at pain center  03/27/06  . Bone graft from hip to jaw  2004  . Bone graft right side of head to jaw  2006  . 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 Sphenopalatine ganglionic     Family History:  Family History  Problem Relation Age of Onset  . Hyperlipidemia Mother   . Hypertension Mother   . Depression Mother   . Anxiety disorder Mother   . Depression Brother   . Anxiety disorder Brother   . Bipolar disorder Maternal Grandfather   . Schizophrenia Maternal Grandfather   . Bipolar disorder Paternal Grandfather    Family Psychiatric  History: See previous Social History:  Social History   Substance and Sexual Activity  Alcohol Use No  . Alcohol/week: 0.0 standard drinks     Social History   Substance and Sexual Activity  Drug Use No    Social History   Socioeconomic History  .  Marital status: Single    Spouse name: Not on file  . Number of children: 0  . Years of education: 83  . Highest education level: Not on file  Occupational History  . Occupation: Marketing executive    Employer: [employer]  Social Needs  . Financial resource strain: Not on file  . Food insecurity:    Worry: Not on file    Inability: Not on file  . Transportation needs:    Medical: Not on file    Non-medical:  Not on file  Tobacco Use  . Smoking status: Current Every Day Smoker    Packs/day: 1.00    Years: 23.00    Pack years: 23.00    Types: Cigarettes    Start date: 09/22/1995  . Smokeless tobacco: Former Engineer, water and Sexual Activity  . Alcohol use: No    Alcohol/week: 0.0 standard drinks  . Drug use: No  . Sexual activity: Not Currently  Lifestyle  . Physical activity:    Days per week: Not on file    Minutes per session: Not on file  . Stress: Not on file  Relationships  . Social connections:    Talks on phone: Not on file    Gets together: Not on file    Attends religious service: Not on file    Active member of club or organization: Not on file    Attends meetings of clubs or organizations: Not on file    Relationship status: Not on file  Other Topics Concern  . Not on file  Social History Narrative      Exercise:  None, running causes head pain   Diet:  FF once daily, + fruits and veggies, + H2O, + Soda   Lives with mom in a one story home.  No children.  Currently not working.  Trying to get disability.  Education: college.    Additional Social History:    Allergies:   Allergies  Allergen Reactions  . Toradol [Ketorolac Tromethamine] Swelling  . Lamotrigine Rash    Labs:  Results for orders placed or performed during the hospital encounter of 03/28/18 (from the past 48 hour(s))  Glucose, capillary     Status: None   Collection Time: 04/05/18  5:57 AM  Result Value Ref Range   Glucose-Capillary 99 70 - 99 mg/dL   Comment 1 Notify RN   Magnesium     Status: None   Collection Time: 04/05/18  2:28 PM  Result Value Ref Range   Magnesium 2.3 1.7 - 2.4 mg/dL    Comment: Performed at [facility], [address] [place], [place] [address]  Phosphorus     Status: None   Collection Time: 04/05/18  2:28 PM  Result Value Ref Range   Phosphorus 3.5 2.5 - 4.6 mg/dL    Comment: Performed at [facility], [address] [place], [place]  [address]  Urinalysis, Complete w Microscopic     Status: Abnormal   Collection Time: 04/05/18  6:50 PM  Result Value Ref Range   Color, Urine AMBER (A) YELLOW    Comment: BIOCHEMICALS MAY BE AFFECTED BY COLOR   APPearance HAZY (A) CLEAR   Specific Gravity, Urine 1.034 (H) 1.005 - 1.030   pH 5.0 5.0 - 8.0   Glucose, UA NEGATIVE NEGATIVE mg/dL   Hgb urine dipstick MODERATE (A) NEGATIVE   Bilirubin Urine NEGATIVE NEGATIVE   Ketones, ur NEGATIVE NEGATIVE mg/dL   Protein, ur  30 (A) NEGATIVE mg/dL   Nitrite NEGATIVE NEGATIVE   Leukocytes,Ua NEGATIVE NEGATIVE   RBC / HPF >50 (H) 0 - 5 RBC/hpf   WBC, UA 0-5 0 - 5 WBC/hpf   Bacteria, UA RARE (A) NONE SEEN   Squamous Epithelial / LPF NONE SEEN 0 - 5   Mucus PRESENT     Comment: Performed at [facility], [address], [place], [place] [address]    Current Facility-Administered Medications  Medication Dose Route Frequency Provider Last Rate Last Dose  . acetaminophen (TYLENOL) tablet 650 mg  650 mg Oral Q6H PRN Milagros Loll, MD   650 mg at 03/30/18 8295   Or  . acetaminophen (TYLENOL) suppository 650 mg  650 mg Rectal Q6H PRN Sudini, Wardell Heath, MD      . albuterol (PROVENTIL) (2.5 MG/3ML) 0.083% nebulizer solution 2.5 mg  2.5 mg Nebulization Q2H PRN Sudini, Srikar, MD      . bisacodyl (DULCOLAX) suppository 10 mg  10 mg Rectal Daily Alford Highland, MD   10 mg at 04/06/18 1711  . diphenhydrAMINE (BENADRYL) capsule 25 mg  25 mg Oral Q6H PRN Alford Highland, MD   25 mg at 04/06/18 1538  . enoxaparin (LOVENOX) injection 40 mg  40 mg Subcutaneous Q24H Alford Highland, MD   40 mg at 04/06/18 1332  . fluticasone (FLONASE) 50 MCG/ACT nasal spray 2 spray  2 spray Each Nare Daily Wieting, Richard, MD   2 spray at 04/06/18 1100  . haloperidol lactate (HALDOL) injection 1 mg  1 mg Intravenous Q4H PRN Clapacs, Jackquline Denmark, MD   1 mg at 04/06/18 1530  . metoprolol succinate (TOPROL-XL) 24 hr tablet 50 mg  50 mg Oral Daily Sudini, Srikar, MD   50 mg  at 04/06/18 1056  . nicotine (NICODERM CQ - dosed in mg/24 hours) patch 14 mg  14 mg Transdermal Daily Milagros Loll, MD   14 mg at 04/06/18 1000  . ondansetron (ZOFRAN) tablet 4 mg  4 mg Oral Q6H PRN Milagros Loll, MD       Or  . ondansetron (ZOFRAN) injection 4 mg  4 mg Intravenous Q6H PRN Sudini, Srikar, MD      . paliperidone (INVEGA) 24 hr tablet 6 mg  6 mg Oral Daily Terance Hart, MD   6 mg at 04/06/18 1056  . polyethylene glycol (MIRALAX / GLYCOLAX) packet 17 g  17 g Oral BID Toney Reil, MD   17 g at 04/06/18 1056  . tamsulosin (FLOMAX) capsule 0.4 mg  0.4 mg Oral Daily Milagros Loll, MD   0.4 mg at 04/06/18 1056    Musculoskeletal: Strength & Muscle Tone: unable to assess today Gait & Station: did not assess today, but per sitter, pt walked with staff today Patient leans: unknown  Psychiatric Specialty Exam: Physical Exam  Nursing note and vitals reviewed. Constitutional: He appears well-developed and well-nourished.  HENT:  Head: Normocephalic.  Respiratory: Effort normal.  Neurological: He is alert.  Psychiatric: He is actively hallucinating (of note, was not hallucinating at time of assessment, but received message from RN that pt started hallucinating after he woke up later in the afternoon). He is not agitated and not aggressive. Cognition and memory are impaired. He expresses inappropriate judgment.    Review of Systems  Constitutional: Positive for malaise/fatigue.  HENT: Negative.   Eyes: Negative.   Respiratory:       Effort normal during assessment, but per staff pt desat'ed while ambulating   Cardiovascular: Negative.  Skin: Negative.        Multiple tattoos on upper and lower extremities    Neurological:       Oriented to self and place, not date   Psychiatric/Behavioral: Positive for hallucinations and memory loss. Negative for suicidal ideas.    Blood pressure 107/62, pulse 96, temperature 98.2 F (36.8 C), temperature source Oral,  resp. rate 18, height 6\' 1"  (1.854 m), weight 99.9 kg, SpO2 92 %.Body mass index is 29.06 kg/m.  General Appearance: Casual  Eye Contact:  Minimal  Speech:  Garbled, Slow and Slurred  Volume:  Decreased  Mood:  Dysphoric  Affect:  Constricted  Thought Process:  Disorganized  Orientation:  Negative  Thought Content:  Illogical, Hallucinations: Auditory Visual and Rumination  Suicidal Thoughts:  No  Homicidal Thoughts:  No  Memory:  Immediate;   Fair Recent;   Poor Remote;   Poor  Judgement:  Impaired  Insight:  Shallow  Psychomotor Activity:  Decreased and Restlessness  Concentration:  Concentration: Poor  Recall:  Poor  Fund of Knowledge:  Fair  Language:  Fair  Akathisia:  No  Handed:  Right  AIMS (if indicated):     Assets:  Desire for Improvement Financial Resources/Insurance Housing Social Support  ADL's:  Impaired  Cognition:  Impaired,  Moderate  Sleep:   sleeping per primary team     Treatment Plan Summary: Daily contact with patient to assess and evaluate symptoms and progress in treatment, Medication management and Plan Patient currently does not have the capacity to engage in conversation about his illness.  He continue to have waxing and waning of his cognition.  Per staff and pt, he is feeling better today, physically and psychologically.  Will continue with Invega 6 mg daily because of his intermittent hallucinations and delirium.    Disposition: Patient currently does not meet criteria to inpatient behaivoral health unit due to his medical acuity.  Psychiatry will continue to follow patient due to his delirium and intermittent hallucinations.   Hessie Knows, MD 04/06/2018 7:36 PM

## 2018-04-06 NOTE — Progress Notes (Signed)
A second dose of haldol given. As the patient indicated he was seen and hearing  things " his brother working in the roof talking to him". The patient responds appropriately neuro questions.

## 2018-04-06 NOTE — Progress Notes (Signed)
Psychiatrist messaged: So the patient was oriented x4 this morning. He just woke up but is hallusinating. He is awake but looking at his abdomen and digging in it indicating he is trying to get to work by trying to get out of the tunnel. He is oriented to self, place, disoriented to time and situation. I'm going to provide his PRN haldol to help calm him down.

## 2018-04-06 NOTE — Progress Notes (Signed)
The patient is alert and oriented x4 after haldol given and itching medication. The patient is still a little drowsy yet is able to respond correctly.

## 2018-04-06 NOTE — Progress Notes (Signed)
1L of oxygen provided for comfort, When sleeping oxygen desats to 88%. On 1L oxygen level is at 92%.

## 2018-04-06 NOTE — Progress Notes (Signed)
MD notified: The psychiatrist came to see the patient and spoke with the psych unit charge nurse which indicated they do not accept patient's with foley catheter or who need in/out catherization.

## 2018-04-06 NOTE — Care Management Important Message (Signed)
Important Message  Patient Details  Name: Jeffrey Lin MRN: [id number] Date of Birth: 1975/12/10   Medicare Important Message Given:  Yes    [name] 04/06/2018, 11:23 AM

## 2018-04-07 DIAGNOSIS — F259 Schizoaffective disorder, unspecified: Secondary | ICD-10-CM

## 2018-04-07 LAB — URINALYSIS, COMPLETE (UACMP) WITH MICROSCOPIC
Bilirubin Urine: NEGATIVE
Glucose, UA: NEGATIVE mg/dL
KETONES UR: NEGATIVE mg/dL
Nitrite: NEGATIVE
Protein, ur: NEGATIVE mg/dL
Specific Gravity, Urine: 1.009 (ref 1.005–1.030)
Squamous Epithelial / HPF: NONE SEEN (ref 0–5)
pH: 7 (ref 5.0–8.0)

## 2018-04-07 LAB — URINE CULTURE
Culture: NO GROWTH
Special Requests: NORMAL

## 2018-04-07 LAB — CREATININE, SERUM
Creatinine, Ser: 0.92 mg/dL (ref 0.61–1.24)
GFR calc Af Amer: >60 mL/min (ref >60)
GFR calc non Af Amer: >60 mL/min (ref >60)

## 2018-04-07 NOTE — Progress Notes (Signed)
Nutrition Brief Note  Patient identified for LOS day 88  43 y.o. male patient transferred to medical floor from Behavioral health on 03/28/2018 with pneumonia, acute hypoxic respiratory failure, COPD, delirium, history of schizoaffective disorder, and history of polysubstance use disorders. He was transferred to the medical floor on 03/28/2018 after being found hypoxic.   Wt Readings from Last 15 Encounters:  04/07/18 101.1 kg  03/22/18 104.8 kg  03/22/18 113.4 kg  03/06/18 109 kg  02/05/18 113.4 kg  02/03/18 113.7 kg  01/08/18 108.9 kg  12/04/17 108.9 kg  11/06/17 108.9 kg  10/09/17 108.9 kg  09/11/17 108.9 kg  09/02/17 117.9 kg  08/06/17 108.9 kg  08/05/17 108.9 kg  05/07/17 115.9 kg    Body mass index is 29.41 kg/m. Patient meets criteria for overweight based on current BMI.   Current diet order is regular, patient is consuming approximately 100% of meals at this time. Labs and medications reviewed.   No nutrition interventions warranted at this time. If nutrition issues arise, please consult RD.   Betsey Holiday MS, RD, LDN Pager #- [phone] Office#- [phone] After Hours Pager: [phone]

## 2018-04-07 NOTE — Progress Notes (Signed)
The patient has not reported any hallucinations today. Urine output has been higher today then yesterday. No haldol needed. UA was sent off as the patient started to have white clots in the catheter tubing. It seem to be mucus like. Vitals stable at this time. Ambulated two times in the hallway. Set up in the chair for about 2 hours two times. No falls. 1:1 sitter. IVC patient. Alert and oriented x4.

## 2018-04-07 NOTE — Progress Notes (Signed)
Pt refuses Bipap

## 2018-04-07 NOTE — Consult Note (Signed)
[facility] Face-to-Face Psychiatry Consult   Reason for Consult: Follow-up consult for this 43 year old man with a history of schizophrenia or schizoaffective disorder who is currently suffering from delirium and confusion while recovering from Lin. Referring Physician: Fonnie Birkenhead Patient Identification: Jeffrey Lin MRN:  [id number] Principal Diagnosis: Subacute delirium Diagnosis:  Principal Problem:   Subacute delirium Active Problems:   Schizoaffective disorder (HCC)   Lin   Total Time spent with patient, discussing plan of care with patient's mother, reviewing chart and discussing plan of care with pt's treatment team: 30 minutes  Subjective:   Jeffrey Lin is a 43 y.o. male patient transferred to medical floor from Behavioral health on 03/28/2018 with Lin, acute hypoxic respiratory failure, COPD, delirium, history of schizoaffective disorder, and history of polysubstance use disorders.  He was transferred to the medical floor on 03/28/2018 after being found hypoxic.   HPI: Patient was seen and chart reviewed.  Patient was sitting up in chair and eating breakfast upon entry to room; sitter at bedside.  Mother also present.   Patient reports he's feeling better today. Patient reports feeling better today.  So far today, pt has not experience any hallucinations.  Discussed his current medication regimen with pt and mother.   They agree with continuing invega 6 mg daily for now and low dose PRN IV haldol for breakthrough hallucinations/delirium symptoms.  No agitation this morning.   Pt oriented to self, place, date and reason for admission.  He answered questions appropriately today.  Discussed with pt and mother that pt's father passed away ~1 year ago. This has been a stressor for pt. Also mother shares that pt's maternal grandfather had psychological issues and committed suicide years ago.    Past Psychiatric History: Long history of schizoaffective disorder complicated by  neurologic problems and substance abuse and polypharmacy. Current psychiatrist is Dr. Evelene Croon in [facility] Therapist is "Britta Mccreedy".   Risk to Self:  pt denies SI Risk to Others:  pt denies HI Prior Inpatient Therapy:  yes Prior Outpatient Therapy:  yes  Past Medical History:  Past Medical History:  Diagnosis Date  . Anxiety   . Chronic leg pain   . Depression   . Essential hypertension, benign 10/20/2015  . Schizoaffective disorder [facility])     Past Surgical History:  Procedure Laterality Date  . Block procedure at pain center  03/27/06  . Bone graft from hip to jaw  2004  . Bone graft right side of head to jaw  2006  . 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 Sphenopalatine ganglionic     Family History:  Family History  Problem Relation Age of Onset  . Hyperlipidemia Mother   . Hypertension Mother   . Depression Mother   . Anxiety disorder Mother   . Depression Brother   . Anxiety disorder Brother   . Bipolar disorder Maternal Grandfather   . Schizophrenia Maternal Grandfather   . Bipolar disorder Paternal Grandfather    Family Psychiatric  History:  Maternal grandfather had "psychological issues" and committed suicide years ago.   Social History:  Social History   Substance and Sexual Activity  Alcohol Use No  . Alcohol/week: 0.0 standard drinks     Social History   Substance and Sexual Activity  Drug Use No    Social History   Socioeconomic History  . Marital status: Single    Spouse name: Not on file  . Number of children: 0  . Years of education: 46  .  Highest education level: Not on file  Occupational History  . Occupation: Marketing executive    Employer: [employer]  Social Needs  . Financial resource strain: Not on file  . Food insecurity:    Worry: Not on file    Inability: Not on file  . Transportation needs:    Medical: Not on file    Non-medical: Not on file  Tobacco Use  . Smoking status: Current Every Day Smoker     Packs/day: 1.00    Years: 23.00    Pack years: 23.00    Types: Cigarettes    Start date: 09/22/1995  . Smokeless tobacco: Former Engineer, water and Sexual Activity  . Alcohol use: No    Alcohol/week: 0.0 standard drinks  . Drug use: No  . Sexual activity: Not Currently  Lifestyle  . Physical activity:    Days per week: Not on file    Minutes per session: Not on file  . Stress: Not on file  Relationships  . Social connections:    Talks on phone: Not on file    Gets together: Not on file    Attends religious service: Not on file    Active member of club or organization: Not on file    Attends meetings of clubs or organizations: Not on file    Relationship status: Not on file  Other Topics Concern  . Not on file  Social History Narrative      Exercise:  None, running causes head pain   Diet:  FF once daily, + fruits and veggies, + H2O, + Soda   Lives with mom in a one story home.  No children.  Currently not working.  Trying to get disability.  Education: college.    Additional Social History:    Allergies:   Allergies  Allergen Reactions  . Toradol [Ketorolac Tromethamine] Swelling  . Lamotrigine Rash    Labs:  Results for orders placed or performed during the hospital encounter of 03/28/18 (from the past 48 hour(s))  Magnesium     Status: None   Collection Time: 04/05/18  2:28 PM  Result Value Ref Range   Magnesium 2.3 1.7 - 2.4 mg/dL    Comment: Performed at [facility], [address] [place], [place] [address]  Phosphorus     Status: None   Collection Time: 04/05/18  2:28 PM  Result Value Ref Range   Phosphorus 3.5 2.5 - 4.6 mg/dL    Comment: Performed at [facility], [address] [place], [place] [address]  Urinalysis, Complete w Microscopic     Status: Abnormal   Collection Time: 04/05/18  6:50 PM  Result Value Ref Range   Color, Urine AMBER (A) YELLOW    Comment: BIOCHEMICALS MAY BE AFFECTED BY COLOR   APPearance HAZY (A) CLEAR    Specific Gravity, Urine 1.034 (H) 1.005 - 1.030   pH 5.0 5.0 - 8.0   Glucose, UA NEGATIVE NEGATIVE mg/dL   Hgb urine dipstick MODERATE (A) NEGATIVE   Bilirubin Urine NEGATIVE NEGATIVE   Ketones, ur NEGATIVE NEGATIVE mg/dL   Protein, ur 30 (A) NEGATIVE mg/dL   Nitrite NEGATIVE NEGATIVE   Leukocytes,Ua NEGATIVE NEGATIVE   RBC / HPF >50 (H) 0 - 5 RBC/hpf   WBC, UA 0-5 0 - 5 WBC/hpf   Bacteria, UA RARE (A) NONE SEEN   Squamous Epithelial / LPF NONE SEEN 0 - 5   Mucus PRESENT     Comment:  Performed at [facility], [address], [place], [place] [address]  Urine Culture     Status: None   Collection Time: 04/05/18  6:50 PM  Result Value Ref Range   Specimen Description      URINE, CATHETERIZED Performed at [facility], [address], [place], [place] [address]    Special Requests      Normal Performed at [facility], [address] [place], [place] [address]    Culture      NO GROWTH Performed at [facility], [address], [place], [place] [address]    Report Status 04/07/2018 FINAL   Creatinine, serum     Status: None   Collection Time: 04/07/18  3:01 AM  Result Value Ref Range   Creatinine, Ser 0.92 0.61 - 1.24 mg/dL   GFR calc non Af Amer >60 >60 mL/min   GFR calc Af Amer >60 >60 mL/min    Comment: Performed at [facility], [address] [place], [place] [address]    Current Facility-Administered Medications  Medication Dose Route Frequency Provider Last Rate Last Dose  . acetaminophen (TYLENOL) tablet 650 mg  650 mg Oral Q6H PRN Milagros Loll, MD   650 mg at 03/30/18 2355   Or  . acetaminophen (TYLENOL) suppository 650 mg  650 mg Rectal Q6H PRN Sudini, Wardell Heath, MD      . albuterol (PROVENTIL) (2.5 MG/3ML) 0.083% nebulizer solution 2.5 mg  2.5 mg Nebulization Q2H PRN Sudini, Srikar, MD      . bisacodyl (DULCOLAX) suppository 10 mg  10 mg Rectal Daily Alford Highland, MD   10 mg at 04/07/18 1026  .  diphenhydrAMINE (BENADRYL) capsule 25 mg  25 mg Oral Q6H PRN Alford Highland, MD   25 mg at 04/06/18 2328  . enoxaparin (LOVENOX) injection 40 mg  40 mg Subcutaneous Q24H Alford Highland, MD   40 mg at 04/06/18 1332  . fluticasone (FLONASE) 50 MCG/ACT nasal spray 2 spray  2 spray Each Nare Daily Wieting, Richard, MD   2 spray at 04/07/18 1022  . haloperidol lactate (HALDOL) injection 1 mg  1 mg Intravenous Q4H PRN Clapacs, Jackquline Denmark, MD   1 mg at 04/06/18 1943  . metoprolol succinate (TOPROL-XL) 24 hr tablet 50 mg  50 mg Oral Daily Milagros Loll, MD   50 mg at 04/07/18 1021  . nicotine (NICODERM CQ - dosed in mg/24 hours) patch 14 mg  14 mg Transdermal Daily Milagros Loll, MD   14 mg at 04/07/18 1022  . ondansetron (ZOFRAN) tablet 4 mg  4 mg Oral Q6H PRN Milagros Loll, MD       Or  . ondansetron (ZOFRAN) injection 4 mg  4 mg Intravenous Q6H PRN Sudini, Srikar, MD      . paliperidone (INVEGA) 24 hr tablet 6 mg  6 mg Oral Daily Terance Hart, MD   6 mg at 04/07/18 1021  . polyethylene glycol (MIRALAX / GLYCOLAX) packet 17 g  17 g Oral BID Toney Reil, MD   17 g at 04/07/18 1021  . tamsulosin (FLOMAX) capsule 0.4 mg  0.4 mg Oral Daily Milagros Loll, MD   0.4 mg at 04/07/18 1021    Musculoskeletal: Strength & Muscle Tone: not formally tested Gait & Station: staff to ambulate with pt today Patient leans: unknown  Psychiatric Specialty Exam: Physical Exam  Nursing note and vitals reviewed. Constitutional: He is oriented to person, place, and time. He appears well-developed and  well-nourished.  HENT:  Head: Normocephalic.  Respiratory: Effort normal.  Neurological: He is alert and oriented to person, place, and time.  Psychiatric: He is not agitated, not aggressive and not actively hallucinating (of note, was not hallucinating at time of assessment, but received message from RN that pt started hallucinating after he woke up later in the afternoon). Cognition and memory are not  impaired. He expresses no homicidal and no suicidal ideation.  Mood: pt is feeling better today; misses deceased father, but overall pt is improving; affect-appears withdrawn, sad Speech-low volume but appropriate speech    Review of Systems  Constitutional: Positive for malaise/fatigue.  HENT: Negative.   Eyes: Negative.   Respiratory:       Effort normal during assessment, but per staff pt desat'ed while sleeping   Cardiovascular: Negative.   Skin: Negative.        Multiple tattoos on upper and lower extremities    Neurological:       Oriented to self, time, place and condition    Psychiatric/Behavioral: Negative for hallucinations and suicidal ideas.    Blood pressure 121/78, pulse 81, temperature 98.3 F (36.8 C), temperature source Oral, resp. rate 18, height 6\' 1"  (1.854 m), weight 101.1 kg, SpO2 92 %.Body mass index is 29.41 kg/m.  General Appearance: Casual  Eye Contact:  Minimal  Speech:  Clear and Coherent  Volume:  Decreased  Mood:  "better"  Affect:  Constricted  Thought Process:  Coherent and Linear  Orientation:  Full (Time, Place, and Person)  Thought Content:  Logical  Suicidal Thoughts:  No; pt denies SI  Homicidal Thoughts:  No; pt denies HI  Memory:  grossly intact  Judgement:  Fair  Insight:  Fair  Psychomotor Activity:  Decreased  Concentration:  Concentration: improving  Recall:  improving  Fund of Knowledge:  Fair  Language:  Fair  Akathisia:  No  Handed:  Right  AIMS (if indicated):     Assets:  Desire for Improvement Financial Resources/Insurance Housing Social Support  ADL's:  Impaired  Cognition:  improving  Sleep:    sleeping good per primary team     Treatment Plan Summary: Daily contact with patient to assess and evaluate symptoms and progress in treatment, Medication management and Plan Patient is showing cognitive improvement.  He denies hallucinations today.  Will continue with Invega 6 mg daily.  Disposition: Patient  currently does not meet criteria to inpatient behaivoral health unit due to his medical acuity.  Hopefully, patient will be able to return home after medical clearance.   However, psychiatry will continue to follow patient due to his delirium and intermittent hallucinations.  Risks and benefits of antipsychotic medication discussed with patient and his mother and they voice understanding and consent to the current medication and treatment plan.   Once pt is medically cleared and if patient does not get readmitted to Psychiatric unit, he will need follow up with his psychiatrist, Dr. Evelene Croon in [facility] and Therapist "Britta Mccreedy".     Hessie Knows, MD 04/07/2018 11:19 AM

## 2018-04-07 NOTE — Progress Notes (Signed)
Patient ID: Jeffrey Lin, male   DOB: 1975-12-08, 43 y.o.   MRN: [id number]  [employer] PROGRESS NOTE  Jeffrey Lin:[id number] DOB: 1975/03/30 DOA: 03/28/2018 PCP: Excell Seltzer, MD  HPI/Subjective: Patient feeling okay.  Sitting up in chair.  No abdominal pain.  Having bowel movements.  Still with Foley catheter in.  Objective: Vitals:   04/07/18 0503 04/07/18 0956  BP: 118/74 121/78  Pulse: 92 81  Resp:    Temp:  98.3 F (36.8 C)  SpO2: 92% 92%    Filed Weights   04/05/18 0430 04/06/18 0500 04/07/18 0503  Weight: 98.9 kg 99.9 kg 101.1 kg    ROS: Review of Systems  Constitutional: Negative for chills and fever.  Eyes: Negative for blurred vision.  Respiratory: Negative for cough and shortness of breath.   Cardiovascular: Negative for chest pain.  Gastrointestinal: Negative for abdominal pain, constipation, diarrhea, nausea and vomiting.  Musculoskeletal: Negative for joint pain.  Neurological: Negative for dizziness and headaches.   Exam: Physical Exam  HENT:  Nose: No mucosal edema.  Mouth/Throat: No oropharyngeal exudate or posterior oropharyngeal edema.  Eyes: Pupils are equal, round, and reactive to light. Conjunctivae, EOM and lids are normal.  Neck: No JVD present. Carotid bruit is not present. No edema present. No thyroid mass and no thyromegaly present.  Cardiovascular: S1 normal and S2 normal. Exam reveals no gallop.  No murmur heard. Pulses:      Dorsalis pedis pulses are 2+ on the right side and 2+ on the left side.  Respiratory: No respiratory distress. He has decreased breath sounds in the left lower field. He has no wheezes. He has no rhonchi. He has no rales.  GI: Soft. Bowel sounds are normal. There is no abdominal tenderness.  Musculoskeletal:     Right ankle: He exhibits no swelling.     Left ankle: He exhibits no swelling.  Lymphadenopathy:    He has no cervical adenopathy.  Neurological: He is alert.  Patient moves all extremities  without a problem.  Skin: Skin is warm. No rash noted. Nails show no clubbing.  Psychiatric:  Patient answers some questions appropriately and other questions not appropriately.      Data Reviewed: Basic Metabolic Panel: Recent Labs  Lab 04/01/18 0358 04/04/18 0631 04/05/18 1428 04/07/18 0301  NA  --  139  --   --   K  --  3.6  --   --   CL  --  101  --   --   CO2  --  30  --   --   GLUCOSE  --  95  --   --   BUN  --  28*  --   --   CREATININE 0.88 1.06  --  0.92  CALCIUM  --  9.1  --   --   MG  --   --  2.3  --   PHOS  --   --  3.5  --   CBC: Recent Labs  Lab 04/03/18 0507 04/04/18 0631  WBC 11.2* 10.4  HGB 14.4 14.5  HCT 43.5 44.2  MCV 84.5 84.4  PLT 305 277   BNP (last 3 results) Recent Labs    03/28/18 0931  BNP 49.0      Recent Results (from the past 240 hour(s))  Culture, blood (routine x 2) Call MD if unable to obtain prior to antibiotics being given     Status: None   Collection Time: 03/28/18  1:53 PM  Result Value Ref Range Status   Specimen Description BLOOD LEFT ANTECUBITAL  Final   Special Requests   Final    BOTTLES DRAWN AEROBIC AND ANAEROBIC Blood Culture adequate volume   Culture   Final    NO GROWTH 5 DAYS Performed at [facility], [address], [place], [place] [address]    Report Status 04/02/2018 FINAL  Final  Culture, blood (routine x 2) Call MD if unable to obtain prior to antibiotics being given     Status: None   Collection Time: 03/28/18  1:53 PM  Result Value Ref Range Status   Specimen Description BLOOD RIGHT ANTECUBITAL  Final   Special Requests   Final    BOTTLES DRAWN AEROBIC AND ANAEROBIC Blood Culture adequate volume   Culture   Final    NO GROWTH 5 DAYS Performed at [facility], [address], [place], [place] [address]    Report Status 04/02/2018 FINAL  Final  Body fluid culture     Status: None   Collection Time: 04/03/18  2:26 PM  Result Value Ref Range Status   Specimen Description    Final    PLEURAL Performed at [facility], [address] [place], [place] [address]    Special Requests   Final    NONE Performed at [facility], [address] [place], [place] [address]    Gram Stain   Final    FEW WBC PRESENT, PREDOMINANTLY MONONUCLEAR NO ORGANISMS SEEN    Culture   Final    NO GROWTH 3 DAYS Performed at [facility], [address] [place], [place] [address]    Report Status 04/06/2018 FINAL  Final  Urine Culture     Status: None   Collection Time: 04/05/18  6:50 PM  Result Value Ref Range Status   Specimen Description   Final    URINE, CATHETERIZED Performed at [facility], [address], [place], [place] [address]    Special Requests   Final    Normal Performed at [facility], [address] [place], [place] [address]    Culture   Final    NO GROWTH Performed at [facility], [address] [place], [place] [address]    Report Status 04/07/2018 FINAL  Final      Scheduled Meds: . bisacodyl  10 mg Rectal Daily  . enoxaparin (LOVENOX) injection  40 mg Subcutaneous Q24H  . fluticasone  2 spray Each Nare Daily  . metoprolol succinate  50 mg Oral Daily  . nicotine  14 mg Transdermal Daily  . paliperidone  6 mg Oral Daily  . polyethylene glycol  17 g Oral BID  . tamsulosin  0.4 mg Oral Daily   Continuous Infusions:   Assessment/Plan:  1.  Acute hypoxic respiratory failure.  Patient able to come off oxygen at rest and with ambulation.  Still has some borderline saturations of 88 with sleeping. 2.  Clinical sepsis with bibasilar pneumonia.  Finished antibiotics 3.  Left pleural effusion.  Interventional radiology only able to draw off 3 cc of blood.  Fluid culture negative.  No need for further antibiotics 4.  Urinary retention.  Started on Flomax.  CT scan of the abdomen showed a 3 mm stone in the right UVJ junction without hydronephrosis. Urology placed Foley catheter.  The patient  cannot go to the psychiatry floor with a Foley catheter or in and out catheterizations. 5.  Colonic distention with constipation.  Patient had quite a few bowel movements.  Will continue MiraLAX and make other medications as needed.  Patient having bowel movements. 6.  Acute metabolic encephalopathy and delirium with auditory hallucinations.  Patient received some IV Haldol yesterday.  Mental status better today.  On Invega. 7.  Weakness.  Continued physical therapy evaluation.     Code Status:     Code Status Orders  (From admission, onward)         Start     Ordered   03/28/18 1136  Full code  Continuous     03/28/18 1135        Code Status History    Date Active Date Inactive Code Status Order ID Comments User Context   03/22/2018 2009 03/28/2018 1011 Full Code [id number]  Reggie Pile, MD Inpatient     Family Communication: Mother at bedside Disposition Plan: To be determined  Consultants:  Psychiatry  Time spent: 26 minutes  Woodley Petzold [employer]

## 2018-04-08 ENCOUNTER — Ambulatory Visit: Payer: Self-pay

## 2018-04-08 MED ORDER — TAMSULOSIN HCL 0.4 MG PO CAPS: 0.4000 mg | ORAL_CAPSULE | Freq: Every day | ORAL | 0 refills | Status: DC

## 2018-04-08 MED ORDER — NICOTINE 14 MG/24HR TD PT24: 14.0000 mg | MEDICATED_PATCH | Freq: Every day | TRANSDERMAL | 0 refills | Status: DC

## 2018-04-08 MED ORDER — PALIPERIDONE ER 6 MG PO TB24: 6.0000 mg | ORAL_TABLET | Freq: Every day | ORAL | 0 refills | Status: DC

## 2018-04-08 MED ORDER — FLUTICASONE PROPIONATE 50 MCG/ACT NA SUSP: 2.0000 | Freq: Every day | NASAL | 0 refills | Status: DC

## 2018-04-08 NOTE — Progress Notes (Signed)
The patient was discharged with a urinary catheter. Care education was provided.

## 2018-04-08 NOTE — TOC Transition Note (Signed)
Transition of Care [facility]) - CM/SW Discharge Note   Patient Details  Name: Jeffrey Lin MRN: [id number] Date of Birth: [birth year]  [facility] CM/SW Contact:  Chapman Fitch, RN Phone Number: 04/08/2018, 11:38 AM   Clinical Narrative:     Patient to discharge home today.  Patient live at home with mother.  She is at the bedside for assessment.  PCP bedsole.  Pharmacy [facility].  Denies issues with transportation or obtaining medications.  Mother provides transportation.   Patient has been assessed by PT, recommendations for PT.  Patient agreeable.  CMS Medicare.gov Compare Post Acute Care list reviewed with patient and mother.  State they do not have a preference of agency.  Referral made to [facility] with [facility].  Patient states he has a RW and cane in the home.  Psych follow up was with Dr. Evelene Croon on 7/30, it has been moved up to 3/23    Final next level of care: Home w Home Health Services Barriers to Discharge: Barriers Resolved   Patient Goals and CMS Choice Patient states their goals for this hospitalization and ongoing recovery are:: "If you can get my psych appointment  bumped up that would be good" CMS Medicare.gov Compare Post Acute Care list provided to:: Patient Choice offered to / list presented to : Patient, Parent  Discharge Placement                       Discharge Plan and Services Discharge Planning Services: CM Consult Post Acute Care Choice: Home Health              HH Arranged: RN, PT, Nurse's Aide HH Agency: [facility]   Social Determinants of Health (SDOH) Interventions     Readmission Risk Interventions No flowsheet data found.

## 2018-04-08 NOTE — Discharge Summary (Signed)
[facility] [place] at [facility]   PATIENT NAME: Jeffrey Lin    MR#:  [id number]  DATE OF BIRTH:  11-15-1975  DATE OF ADMISSION:  03/28/2018 ADMITTING PHYSICIAN: Milagros Loll, MD  DATE OF DISCHARGE: No discharge date for patient encounter.  PRIMARY CARE PHYSICIAN: Excell Seltzer, MD    ADMISSION DIAGNOSIS:  Acute Respiratory Failure  DISCHARGE DIAGNOSIS:  Principal Problem:   Subacute delirium Active Problems:   Schizoaffective disorder (HCC)   Pneumonia   SECONDARY DIAGNOSIS:   Past Medical History:  Diagnosis Date  . Anxiety   . Chronic leg pain   . Depression   . Essential hypertension, benign 10/20/2015  . Schizoaffective disorder [facility])     HOSPITAL COURSE:   *Acute hypoxic respiratory failure Resolved Secondary to pneumonia Successfully weaned off oxygen  *Acute sepsis  Due to bibasilar pneumonia Treated on our sepsis protocol, completed course of antibiotics  *Acute bibasilar pneumonia Treated with course of antibiotics  *Acute left pleural effusion Interventional radiology only able to draw off 3 cc of blood.  Fluid culture negative.   *Acute urinary retention Resolved with Foley placement by urology-outpatient follow-up for voiding trial in 1 week with urology Continue Flomax  *Acute abnormal CT scan of the abdomen  Noted for 3 mm stone in the right UVJ junction without hydronephrosis Urology did see patient while in house-recommended conservative management as this was most likely the past, recommended continued straining of urine, Flomax, outpatient follow-up with urology 1 week for reevaluation status post discharge  *Acute Colonic distention with constipation Resolved with appropriate bowel regiment  *Acute metabolic encephalopathy and delirium with auditory hallucinations Markedly improved Psychiatry did see patient while in house, treated with Hinda Glatter, okay for discharge to home with outpatient follow-up with  psychiatry Dr Evelene Croon as well as therapist Britta Mccreedy per psychiatry haiku messaging  *Acute weakness  Physical therapy did see patient while in house-recommended home health PT status post discharge   DISCHARGE CONDITIONS:   stable  CONSULTS OBTAINED:  Treatment Team:  Audery Amel, MD Terance Hart, MD Bjorn Pippin, MD  DRUG ALLERGIES:   Allergies  Allergen Reactions  . Toradol [Ketorolac Tromethamine] Swelling  . Lamotrigine Rash    DISCHARGE MEDICATIONS:   Allergies as of 04/08/2018      Reactions   Toradol [ketorolac Tromethamine] Swelling   Lamotrigine Rash      Medication List    STOP taking these medications   amantadine 100 MG capsule Commonly known as:  SYMMETREL   amphetamine-dextroamphetamine 20 MG tablet Commonly known as:  ADDERALL   benztropine 1 MG tablet Commonly known as:  COGENTIN   diazepam 10 MG tablet Commonly known as:  VALIUM   FLUoxetine 40 MG capsule Commonly known as:  PROZAC   gabapentin 800 MG tablet Commonly known as:  NEURONTIN   hydrOXYzine 25 MG capsule Commonly known as:  VISTARIL   methocarbamol 750 MG tablet Commonly known as:  ROBAXIN   OLANZapine 10 MG tablet Commonly known as:  ZYPREXA     TAKE these medications   atorvastatin 40 MG tablet Commonly known as:  LIPITOR TAKE 1 TABLET BY MOUTH EVERY DAY   fluticasone 50 MCG/ACT nasal spray Commonly known as:  FLONASE Place 2 sprays into both nostrils daily. Start taking on:  April 09, 2018   furosemide 20 MG tablet Commonly known as:  LASIX TAKE 1 TABLET BY MOUTH EVERY DAY   losartan 100 MG tablet Commonly known as:  COZAAR  Take 1 tablet (100 mg total) by mouth daily. Please schedule physical   metoprolol succinate 50 MG 24 hr tablet Commonly known as:  TOPROL-XL Take 1 tablet (50 mg total) by mouth daily. Take with or immediately following a meal.   nicotine 14 mg/24hr patch Commonly known as:  NICODERM CQ - dosed in mg/24 hours Place 1 patch  (14 mg total) onto the skin daily. Start taking on:  April 09, 2018   omeprazole 20 MG capsule Commonly known as:  PRILOSEC Take 20 mg by mouth daily as needed.   paliperidone 6 MG 24 hr tablet Commonly known as:  INVEGA Take 1 tablet (6 mg total) by mouth daily. Start taking on:  April 09, 2018   potassium chloride 10 MEQ tablet Commonly known as:  K-DUR TAKE 1 TABLET BY MOUTH EVERY DAY   tamsulosin 0.4 MG Caps capsule Commonly known as:  FLOMAX Take 1 capsule (0.4 mg total) by mouth daily. Start taking on:  April 09, 2018   Vitamin D (Ergocalciferol) 1.25 MG (50000 UT) Caps capsule Commonly known as:  DRISDOL Take 1 capsule (50,000 Units total) by mouth 2 (two) times a week.        DISCHARGE INSTRUCTIONS:      If you experience worsening of your admission symptoms, develop shortness of breath, life threatening emergency, suicidal or homicidal thoughts you must seek medical attention immediately by calling 911 or calling your MD immediately  if symptoms less severe.  You Must read complete instructions/literature along with all the possible adverse reactions/side effects for all the Medicines you take and that have been prescribed to you. Take any new Medicines after you have completely understood and accept all the possible adverse reactions/side effects.   Please note  You were cared for by a hospitalist during your hospital stay. If you have any questions about your discharge medications or the care you received while you were in the hospital after you are discharged, you can call the unit and asked to speak with the hospitalist on call if the hospitalist that took care of you is not available. Once you are discharged, your primary care physician will handle any further medical issues. Please note that NO REFILLS for any discharge medications will be authorized once you are discharged, as it is imperative that you return to your primary care physician (or establish a  relationship with a primary care physician if you do not have one) for your aftercare needs so that they can reassess your need for medications and monitor your lab values.    Today   CHIEF COMPLAINT:  No chief complaint on file.   HISTORY OF PRESENT ILLNESS:  43 y.o. male with a known history of schedule affective disorder, hypertension, depression admitted to behavioral health unit over the last 2 days and is being treated with some sedative medications was found to be extremely drowsy and cyanotic.  Was placed on status oxygen after rapid response was called.  Found to be hypoxic in the 80s on room air.  Patient opens his eyes but sleeps.  Stat ABG was checked which shows normal pH with PCO2 63 and bicarb of 41.  He is afebrile.  Tachycardic up to 110. Patient unable to give any history History obtained from behavioral health unit staff, psychiatrist Dr. Katrinka Blazing, old records are reviewed VITAL SIGNS:  Blood pressure 129/85, pulse 70, temperature 97.7 F (36.5 C), temperature source Oral, resp. rate 20, height 6\' 1"  (1.854 m), weight 101 kg, SpO2  92 %.  I/O:    Intake/Output Summary (Last 24 hours) at 04/08/2018 1039 Last data filed at 04/07/2018 1900 Gross per 24 hour  Intake 240 ml  Output 850 ml  Net -610 ml    PHYSICAL EXAMINATION:  GENERAL:  [age] y.o.-year-old patient lying in the bed with no acute distress.  EYES: Pupils equal, round, reactive to light and accommodation. No scleral icterus. Extraocular muscles intact.  HEENT: Head atraumatic, normocephalic. Oropharynx and nasopharynx clear.  NECK:  Supple, no jugular venous distention. No thyroid enlargement, no tenderness.  LUNGS: Normal breath sounds bilaterally, no wheezing, rales,rhonchi or crepitation. No use of accessory muscles of respiration.  CARDIOVASCULAR: S1, S2 normal. No murmurs, rubs, or gallops.  ABDOMEN: Soft, non-tender, non-distended. Bowel sounds present. No organomegaly or mass.  EXTREMITIES: No pedal  edema, cyanosis, or clubbing.  NEUROLOGIC: Cranial nerves II through XII are intact. Muscle strength 5/5 in all extremities. Sensation intact. Gait not checked.  PSYCHIATRIC: The patient is alert and oriented x 3.  SKIN: No obvious rash, lesion, or ulcer.   DATA REVIEW:   CBC Recent Labs  Lab 04/04/18 0631  WBC 10.4  HGB 14.5  HCT 44.2  PLT 277    Chemistries  Recent Labs  Lab 04/04/18 0631 04/05/18 1428 04/07/18 0301  NA 139  --   --   K 3.6  --   --   CL 101  --   --   CO2 30  --   --   GLUCOSE 95  --   --   BUN 28*  --   --   CREATININE 1.06  --  0.92  CALCIUM 9.1  --   --   MG  --  2.3  --     Cardiac Enzymes No results for input(s): TROPONINI in the last 168 hours.  Microbiology Results  Results for orders placed or performed during the hospital encounter of 03/28/18  Culture, blood (routine x 2) Call MD if unable to obtain prior to antibiotics being given     Status: None   Collection Time: 03/28/18  1:53 PM  Result Value Ref Range Status   Specimen Description BLOOD LEFT ANTECUBITAL  Final   Special Requests   Final    BOTTLES DRAWN AEROBIC AND ANAEROBIC Blood Culture adequate volume   Culture   Final    NO GROWTH 5 DAYS Performed at [facility], [address] [place], [place] [address]    Report Status 04/02/2018 FINAL  Final  Culture, blood (routine x 2) Call MD if unable to obtain prior to antibiotics being given     Status: None   Collection Time: 03/28/18  1:53 PM  Result Value Ref Range Status   Specimen Description BLOOD RIGHT ANTECUBITAL  Final   Special Requests   Final    BOTTLES DRAWN AEROBIC AND ANAEROBIC Blood Culture adequate volume   Culture   Final    NO GROWTH 5 DAYS Performed at [facility], [address] [place], [place] [address]    Report Status 04/02/2018 FINAL  Final  Body fluid culture     Status: None   Collection Time: 04/03/18  2:26 PM  Result Value Ref Range Status   Specimen Description    Final    PLEURAL Performed at [facility], [address] [place], [place] [address]    Special Requests   Final    NONE Performed at [facility], [address] [place], [place]  [address]    Gram Stain   Final    FEW WBC PRESENT, PREDOMINANTLY MONONUCLEAR NO ORGANISMS SEEN    Culture   Final    NO GROWTH 3 DAYS Performed at [facility], [address] [place], [place] [address]    Report Status 04/06/2018 FINAL  Final  Urine Culture     Status: None   Collection Time: 04/05/18  6:50 PM  Result Value Ref Range Status   Specimen Description   Final    URINE, CATHETERIZED Performed at [facility], [address] [place], [place] [address]    Special Requests   Final    Normal Performed at [facility], [address] [place], [place] [address]    Culture   Final    NO GROWTH Performed at [facility], [address] [place], [place], [place] [address]    Report Status 04/07/2018 FINAL  Final    RADIOLOGY:  No results found.  EKG:   Orders placed or performed during the hospital encounter of 03/28/18  . EKG 12-Lead  . EKG 12-Lead  . EKG 12-Lead  . EKG 12-Lead  . EKG 12-Lead  . EKG 12-Lead      Management plans discussed with the patient, family and they are in agreement.  CODE STATUS:     Code Status Orders  (From admission, onward)         Start     Ordered   03/28/18 1136  Full code  Continuous     03/28/18 1135        Code Status History    Date Active Date Inactive Code Status Order ID Comments User Context   03/22/2018 2009 03/28/2018 1011 Full Code [id number]  Reggie Pile, MD Inpatient      TOTAL TIME TAKING CARE OF THIS PATIENT: 40 minutes.    Evelena Asa Brayley Mackowiak M.D on 04/08/2018 at 10:39 AM  Between 7am to 6pm - Pager - [phone]  After 6pm go to [email or web address] - password [email or web address]  [facility]  Office  [phone]  CC: Primary care physician; Excell Seltzer,  MD   Note: This dictation was prepared with Dragon dictation along with smaller phrase technology. Any transcriptional errors that result from this process are unintentional.

## 2018-04-09 ENCOUNTER — Telehealth: Payer: Self-pay | Admitting: Family Medicine

## 2018-04-09 NOTE — Telephone Encounter (Signed)
Transition Care Management Follow-up Telephone Call  How have you been since you were released from the hospital? Patient say she feels restless.   Do you understand why you were in the hospital? yes   Do you understand the discharge instrcutions? yes  Items Reviewed:  Medications reviewed: yes  Allergies reviewed: yes  Dietary changes reviewed: yes  Referrals reviewed: yes   Functional Questionnaire:   Activities of Daily Living (ADLs):   He states they are independent in the following: ambulation, bathing and hygiene, feeding, continence, grooming, toileting and dressing States they require assistance with the following: No assistance    Any transportation issues/concerns?: no   Any patient concerns? no   Confirmed importance and date/time of follow-up visits scheduled: yes   Confirmed with patient if condition begins to worsen call PCP or go to the ER.  Patient was given the Call-a-Nurse line [phone]: yes

## 2018-04-10 ENCOUNTER — Ambulatory Visit: Payer: Self-pay

## 2018-04-10 LAB — CYTOLOGY - NON PAP

## 2018-04-13 ENCOUNTER — Telehealth: Payer: Self-pay | Admitting: Family Medicine

## 2018-04-13 ENCOUNTER — Ambulatory Visit: Payer: Self-pay

## 2018-04-13 NOTE — Telephone Encounter (Signed)
Pt has hospital follow up appointment 3/24 with dr Retia Passe to keep? Or do you want pt to r/s appointment

## 2018-04-14 ENCOUNTER — Other Ambulatory Visit: Payer: Self-pay

## 2018-04-14 ENCOUNTER — Encounter: Payer: Self-pay | Admitting: Family Medicine

## 2018-04-14 ENCOUNTER — Ambulatory Visit (INDEPENDENT_AMBULATORY_CARE_PROVIDER_SITE_OTHER): Payer: PPO | Admitting: Family Medicine

## 2018-04-14 VITALS — BP 118/70 | HR 117 | Temp 97.7°F | Ht 76.0 in | Wt 243.2 lb

## 2018-04-14 DIAGNOSIS — R39198 Other difficulties with micturition: Secondary | ICD-10-CM | POA: Diagnosis not present

## 2018-04-14 DIAGNOSIS — Z8701 Personal history of pneumonia (recurrent): Secondary | ICD-10-CM | POA: Diagnosis not present

## 2018-04-14 DIAGNOSIS — F17200 Nicotine dependence, unspecified, uncomplicated: Secondary | ICD-10-CM

## 2018-04-14 DIAGNOSIS — F259 Schizoaffective disorder, unspecified: Secondary | ICD-10-CM

## 2018-04-14 DIAGNOSIS — F251 Schizoaffective disorder, depressive type: Secondary | ICD-10-CM

## 2018-04-14 DIAGNOSIS — I10 Essential (primary) hypertension: Secondary | ICD-10-CM

## 2018-04-14 DIAGNOSIS — J189 Pneumonia, unspecified organism: Secondary | ICD-10-CM

## 2018-04-14 DIAGNOSIS — J90 Pleural effusion, not elsewhere classified: Secondary | ICD-10-CM

## 2018-04-14 MED ORDER — NICOTINE 14 MG/24HR TD PT24: 14.0000 mg | MEDICATED_PATCH | Freq: Every day | TRANSDERMAL | 0 refills | Status: DC

## 2018-04-14 MED ORDER — METHOCARBAMOL 750 MG PO TABS: 750.0000 mg | ORAL_TABLET | Freq: Two times a day (BID) | ORAL | 0 refills | Status: DC

## 2018-04-14 NOTE — Assessment & Plan Note (Signed)
Improved control on new regimen. Follow up per psychiatry.

## 2018-04-14 NOTE — Progress Notes (Signed)
Subjective:    Patient ID: Jeffrey Lin, male    DOB: [birth year], 43 y.o.   MRN: [id number]  HPI  43 year old male with history of schizoaffective disorderpresents for follow up  hospitalization in the last month for altered mental status and psycosis.   First admitted to [facility] health  03/22/2018-3/72020 His labwork and head CT were unremarkable at the time of admission. During his initial presentation he was observed by staff to be responding to vivid visual hallucinations. He was placed Valium 10mg  TID, prozac 40mg  po q daily, lasix 20 mg po q daily, gabapentin 800mg  TID, vistaril 25mg  bid, cozaar 100 mg q daily, olanzapine 5mg  po qam and 10mg  po qhs, and protonix 40mg  po q daily. His home medications of symmetrel and aderall were discontinued on admission.The general treatment plan at that time was to decrease his sedating medications slowly while under observation.  Over the course of the next few days he continued to have intermittent worsening of his AVH, notes also report that he was intermittently agitated and paranoid. On 03/24/18 he was ambulatory, however with unsteady gait, and he was placed on a 1:1 order at that time. Notes indicate that he was periodically having hypoxia at that time. His valium was decreased again to 5mg  po TID, the patient continued to present increasingly lethargic and seemingly obtunded with intermittent points of improvement in his mentation. A chest xray from 03/24/18 showed a left basilar infiltrate and effusion, he was placed on amoxicillin on 3/6 and he received two doses.  He was reported to have intermittent hypoxia throughout his stay, first noted on 3/3 per the flowsheets. On the morning on 3/7 at 6:08AM the patient was logged with a pulse ox of 64, there was a rechecked value of 90 logged at 6:39AM. Nursing staff informed the on call physician of the patient's vital sign abnormalities around 8AM, shortly thereafter a rapid response was called and the  patient was placed on oxygen, and transferred to the medical floor.   Admitted to medical floor 03/28/2018-04/08/2018 Dx subacute delerium and CA PNA resulting in acute hypoxic respiratory failure. Treated with antibiotics  *Acute left pleural effusion.. fluid culture negative. *Acute urinary retention Resolved with Foley placement by urology-outpatient follow-up for voiding trial in 1 week with urology Continue Flomax  *Acute abnormalCT scan of the abdomen  Noted for 3 mm stone in the right UVJ junction without hydronephrosis Urology did see patient while in house-recommended conservative management as this was most likely  passed, recommended continued straining of urine, Flomax, outpatient follow-up with urology 1 week for reevaluation status post discharge  *AcuteColonic distention with constipation Resolved with appropriate bowel regiment  *Acute metabolic encephalopathy and delirium with auditory hallucinations Markedly improved Psychiatry did see patient while in house, treated with Hinda Glatter, okay for discharge to home with outpatient follow-up with psychiatry Dr Evelene Croon as well as therapist Britta Mccreedy per psychiatry haiku messaging.  Today 04/14/18 He presents with his mother who assists with history. He has been doing much better, thinking clearly.   Now BMs regular .Marland Kitchen occurring daily. Eating better overall, three meals a day.  Has urinary catheter in until appt at urology in 2 days. Has held lasix and losartan since hospital... BP good today.. not checked at home.  Has completed antibiotics.  No fever, no cough, no congestion. Blood and urine cultures negative.  No abdominal pain, no flank pain.   Working on quitting smoking.. needs nicotine patch refill.  Social History /Family History/Past  Medical History reviewed in detail and updated in EMR if needed. Blood pressure 118/70, pulse (!) 117, temperature 97.7 F (36.5 C), temperature source Oral, height 6\' 4"  (1.93 m),  weight 243 lb 4 oz (110.3 kg), SpO2 95 %.  Review of Systems  Constitutional: Negative for fatigue and fever.  HENT: Negative for ear pain.   Eyes: Negative for pain.  Respiratory: Negative for cough and shortness of breath.   Cardiovascular: Negative for chest pain, palpitations and leg swelling.  Gastrointestinal: Negative for abdominal pain.  Genitourinary: Negative for dysuria.  Musculoskeletal: Negative for arthralgias.  Neurological: Negative for syncope, light-headedness and headaches.  Psychiatric/Behavioral: Negative for dysphoric mood.       Objective:   Physical Exam Constitutional:      Appearance: He is well-developed.     Comments: Central obesity  HENT:     Head: Normocephalic.     Right Ear: Hearing normal.     Left Ear: Hearing normal.     Nose: Nose normal.  Neck:     Thyroid: No thyroid mass or thyromegaly.     Vascular: No carotid bruit.     Trachea: Trachea normal.  Cardiovascular:     Rate and Rhythm: Normal rate and regular rhythm.     Pulses: Normal pulses.     Heart sounds: Heart sounds not distant. No murmur. No friction rub. No gallop.      Comments: 1 plus pitting edema bilaterally  Pulmonary:     Effort: Pulmonary effort is normal. No respiratory distress.     Breath sounds: Normal breath sounds.  Skin:    General: Skin is warm and dry.     Findings: No rash.          Comments: Non-erythematous, nonpainful 1 cm mobile cyst on right temple where glasses rub.  Psychiatric:        Mood and Affect: Affect is blunt.        Speech: Speech normal.        Behavior: Behavior is slowed and withdrawn.        Thought Content: Thought content normal.           Assessment & Plan:

## 2018-04-14 NOTE — Assessment & Plan Note (Signed)
Use lasix prn peripheral edema.  Hold losartan but follow BP and restart if BP > 140/90.

## 2018-04-14 NOTE — Telephone Encounter (Signed)
Patient arrived for his appointment on 04/14/2018 as scheduled.

## 2018-04-14 NOTE — Assessment & Plan Note (Signed)
Catheter in place and funtioning well. Has follow up for voiding trial in 2 days with URO.

## 2018-04-14 NOTE — Assessment & Plan Note (Signed)
Smoking cessation instruction/counseling given:  commended patient for quitting and reviewed strategies for preventing relapses. Filled second stage of  nicotine patches.

## 2018-04-14 NOTE — Patient Instructions (Addendum)
Followed BP at home 2-3 times a week.. if BP > 140/90 regularly.. restart losartan.  Okay to use  Lasix as needed for peripheral swelling.  Keep appt with urology for catheter.  Warm compresses 2-3 times daily and apply topical antibiotics.  Call if the are on right temple not resolving on its own... we refer to dermatology.  Restart cholesterol medication.

## 2018-04-14 NOTE — Assessment & Plan Note (Signed)
Resolved s/p antibiotics. 

## 2018-04-15 ENCOUNTER — Other Ambulatory Visit: Payer: Self-pay | Admitting: Family Medicine

## 2018-04-15 DIAGNOSIS — Z8701 Personal history of pneumonia (recurrent): Secondary | ICD-10-CM | POA: Diagnosis not present

## 2018-04-15 DIAGNOSIS — Z466 Encounter for fitting and adjustment of urinary device: Secondary | ICD-10-CM | POA: Diagnosis not present

## 2018-04-15 DIAGNOSIS — M79605 Pain in left leg: Secondary | ICD-10-CM | POA: Diagnosis not present

## 2018-04-15 DIAGNOSIS — G8929 Other chronic pain: Secondary | ICD-10-CM | POA: Diagnosis not present

## 2018-04-15 DIAGNOSIS — M545 Low back pain: Secondary | ICD-10-CM | POA: Diagnosis not present

## 2018-04-15 DIAGNOSIS — F419 Anxiety disorder, unspecified: Secondary | ICD-10-CM | POA: Diagnosis not present

## 2018-04-15 DIAGNOSIS — F0390 Unspecified dementia without behavioral disturbance: Secondary | ICD-10-CM | POA: Diagnosis not present

## 2018-04-15 DIAGNOSIS — K5900 Constipation, unspecified: Secondary | ICD-10-CM | POA: Diagnosis not present

## 2018-04-15 DIAGNOSIS — Z6830 Body mass index (BMI) 30.0-30.9, adult: Secondary | ICD-10-CM | POA: Diagnosis not present

## 2018-04-15 DIAGNOSIS — F329 Major depressive disorder, single episode, unspecified: Secondary | ICD-10-CM | POA: Diagnosis not present

## 2018-04-15 DIAGNOSIS — R339 Retention of urine, unspecified: Secondary | ICD-10-CM | POA: Diagnosis not present

## 2018-04-15 DIAGNOSIS — I10 Essential (primary) hypertension: Secondary | ICD-10-CM | POA: Diagnosis not present

## 2018-04-15 DIAGNOSIS — F259 Schizoaffective disorder, unspecified: Secondary | ICD-10-CM | POA: Diagnosis not present

## 2018-04-16 ENCOUNTER — Telehealth: Payer: Self-pay

## 2018-04-16 ENCOUNTER — Ambulatory Visit (INDEPENDENT_AMBULATORY_CARE_PROVIDER_SITE_OTHER): Payer: PPO | Admitting: Urology

## 2018-04-16 ENCOUNTER — Ambulatory Visit
Admission: RE | Admit: 2018-04-16 | Discharge: 2018-04-16 | Disposition: A | Discharge status: Home / Self Care | Payer: PPO | Attending: Urology | Admitting: Urology

## 2018-04-16 ENCOUNTER — Ambulatory Visit: Payer: Self-pay | Admitting: Urology

## 2018-04-16 ENCOUNTER — Other Ambulatory Visit: Payer: Self-pay

## 2018-04-16 ENCOUNTER — Ambulatory Visit: Payer: Self-pay

## 2018-04-16 ENCOUNTER — Ambulatory Visit: Payer: PPO | Admitting: Podiatry

## 2018-04-16 ENCOUNTER — Other Ambulatory Visit: Payer: PPO | Admitting: Orthotics

## 2018-04-16 ENCOUNTER — Encounter: Payer: Self-pay | Admitting: Urology

## 2018-04-16 ENCOUNTER — Ambulatory Visit
Admission: RE | Admit: 2018-04-16 | Discharge: 2018-04-16 | Disposition: A | Discharge status: Home / Self Care | Payer: PPO | Source: Ambulatory Visit | Attending: Urology | Admitting: Urology

## 2018-04-16 VITALS — BP 126/81 | HR 121 | Ht 76.0 in | Wt 243.0 lb

## 2018-04-16 DIAGNOSIS — Z466 Encounter for fitting and adjustment of urinary device: Secondary | ICD-10-CM

## 2018-04-16 DIAGNOSIS — N200 Calculus of kidney: Secondary | ICD-10-CM

## 2018-04-16 DIAGNOSIS — N201 Calculus of ureter: Secondary | ICD-10-CM | POA: Diagnosis not present

## 2018-04-16 LAB — BLADDER SCAN AMB NON-IMAGING

## 2018-04-16 MED ORDER — TAMSULOSIN HCL 0.4 MG PO CAPS: 0.4000 mg | ORAL_CAPSULE | Freq: Every day | ORAL | 0 refills | Status: DC

## 2018-04-16 MED ORDER — CIPROFLOXACIN HCL 500 MG PO TABS
500.0000 mg | ORAL_TABLET | Freq: Once | ORAL | Status: AC
  Administered 2018-04-16: 500 mg via ORAL

## 2018-04-16 NOTE — Progress Notes (Signed)
04/16/2018 11:39 AM   Elza Rafter 08-16-1975 [id number]  Referring provider: Jinny Sanders, MD [place], [place] [address]  CC: Right 28m distal ureteral stone, urinary retention  HPI: I saw Mr. SAlonsoin urology clinic today after recent hospitalization for pneumonia at which time he was incidentally found to have a right distal 3 mm ureteral stone, as well as urinary retention requiring Foley catheter.  His past medical history is notable for severe anxiety and schizoaffective disorder.  He reports that over the 6 months preceding this hospitalization he had difficulty urinating with weak stream and feeling of incomplete emptying.  Prostate measures 32 cc on recent CT scan.  He was also incidentally found to have a right distal 3 mm ureteral stone and a duplicated system.  He currently denies any flank pain, nausea, or vomiting, or fevers or chills.  He denies any history of gross hematuria or urinary tract infections.  He has been on Flomax since discharge from the hospital.  He denies any prior history of nephrolithiasis.  There are no aggravating or alleviating factors.  Severity is moderate.  DRE performed by Dr. WJeffie Pollockat time of his recent hospitalization was benign.   PMH: Past Medical History:  Diagnosis Date  . Anxiety   . Chronic leg pain   . Depression   . Essential hypertension, benign 10/20/2015  . Schizoaffective disorder ([facility]     Surgical History: Past Surgical History:  Procedure Laterality Date  . Block procedure at pain center  03/27/06  . Bone graft from hip to jaw  2004  . Bone graft right side of head to jaw  2006  . 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 Sphenopalatine ganglionic      Allergies:  Allergies  Allergen Reactions  . Toradol [Ketorolac Tromethamine] Swelling  . Lamotrigine Rash    Family History: Family History  Problem Relation Age of Onset  . Hyperlipidemia Mother   . Hypertension Mother   . Depression  Mother   . Anxiety disorder Mother   . Depression Brother   . Anxiety disorder Brother   . Bipolar disorder Maternal Grandfather   . Schizophrenia Maternal Grandfather   . Bipolar disorder Paternal Grandfather     Social History:  reports that he has been smoking cigarettes. He started smoking about 22 years ago. He has a 23.00 pack-year smoking history. He has quit using smokeless tobacco. He reports that he does not drink alcohol or use drugs.  ROS: Please see flowsheet from today's date for complete review of systems.  Physical Exam: BP 126/81   Pulse (!) 121   Ht 6' 4"  (1.93 m)   Wt 243 lb (110.2 kg)   BMI 29.58 kg/m    Constitutional:  Alert and oriented, No acute distress. Cardiovascular: No clubbing, cyanosis, or edema. Respiratory: Normal respiratory effort, no increased work of breathing. GI: Abdomen is soft, nontender, nondistended, no abdominal masses GU: No CVA tenderness, urine clear yellow per Foley Lymph: No cervical or inguinal lymphadenopathy. Skin: No rashes, bruises or suspicious lesions. Neurologic: Grossly intact, no focal deficits, moving all 4 extremities. Psychiatric: Normal mood and affect.  Laboratory Data: Serum creatinine 0.92, eGFR greater than 60  Pertinent Imaging: I have personally reviewed the CT 04/04/2018.  3 mm right UVJ stone, duplicated right collecting system and ureters.  Prostate measures 32 cc. KUB today reviewed, persistence of right 3 mm distal ureteral stone  Assessment & Plan: In summary,  the patient is a 43 year old male with extensive psychiatric history with recent hospitalization for pneumonia who developed urinary retention requiring Foley catheter placement, as well as incidentally found 3 mm asymptomatic right UVJ stone on CT 3/14.  KUB today suggest persistence of stone, but he denies any flank pain, nausea, or vomiting.  He passed a void trial today with a PVR of 7 cc.    We discussed various treatment options for  urolithiasis including observation with or without medical expulsive therapy, shockwave lithotripsy (SWL), ureteroscopy and laser lithotripsy with stent placement, and percutaneous nephrolithotomy.  We discussed that management is based on stone size, location, density, patient co-morbidities, and patient preference.   Stones <25m in size have a >80% spontaneous passage rate. Data surrounding the use of tamsulosin for medical expulsive therapy is controversial, but meta analyses suggests it is most efficacious for distal stones between 5-121min size. Possible side effects include dizziness/lightheadedness, and retrograde ejaculation.  SWL has a lower stone free rate in a single procedure, but also a lower complication rate compared to ureteroscopy and avoids a stent and associated stent related symptoms. Possible complications include renal hematoma, steinstrasse, and need for additional treatment.  Ureteroscopy with laser lithotripsy and stent placement has a higher stone free rate than SWL in a single procedure, however increased complication rate including possible infection, ureteral injury, bleeding, and stent related morbidity. Common stent related symptoms include dysuria, urgency/frequency, and flank pain.  After an extensive discussion of the risks and benefits of the above treatment options, the patient would like to proceed with medical expulsive therapy.  RTC for KUB to confirm stone passage in 3 to 4 weeks, PVR same day Continue FlCarrollMD  [place] Point[address] AvenueSuBalticuParadise[place] [id number]

## 2018-04-16 NOTE — Telephone Encounter (Signed)
Jeffrey Lin notified as instructed by telephone.  Patient states understanding.  

## 2018-04-16 NOTE — Telephone Encounter (Signed)
Pt's mom called to say his legs, feet, and ankles are swollen and she is asking what they should be doing. Please call Slater Gettler at [phone].

## 2018-04-16 NOTE — Telephone Encounter (Signed)
See below

## 2018-04-16 NOTE — Telephone Encounter (Signed)
Has he restarted lasix as we discussed? Did he take lasix today, what dose? What is his BP running?

## 2018-04-16 NOTE — Telephone Encounter (Signed)
Have him increase to 2 tabs of 20 mg lasix daily x 3 days then back to 20 mg daily, elevate feet above heart when sitting and make sure getting exercise. If not improving by Monday call back.  If BP goes above 140/90 at home.. restart his BP med losartan at 1/2 tab/dose ( 50 mg daily)

## 2018-04-16 NOTE — Progress Notes (Signed)
Fill and Pull Catheter Removal  Patient is present today for a catheter removal.  Patient was cleaned and prepped in a sterile fashion of sterile water/ saline was instilled into the bladder when the patient felt the urge to urinate and experienced large bladder spasm. 53ml of water was then drained from the balloon.  A 16FR foley cath was removed from the bladder no complications were noted .  Patient as then given some time to void on their own.  Patient can void  on their own after some time.  Patient tolerated well.  Preformed by: Delena Bali, CMA  Patient instructed to return to clinic this afternoon for PVR.   Patient returned to clinic at 2:00pm for PVR. 62ml.

## 2018-04-16 NOTE — Telephone Encounter (Signed)
Spoke with [place].  He states he restarted the Lasix on Tuesday at 20 mg daily.  His blood pressure at his urology appointment this morning was 121/82.

## 2018-04-17 ENCOUNTER — Telehealth: Payer: Self-pay

## 2018-04-17 NOTE — Telephone Encounter (Signed)
Needs verbal orders for PT 2 x week for 4 weeks, 1 x week for 3 weeks For Balance and strength  Call Vernona Rieger with verbal orders [phone]. Can Leave message

## 2018-04-17 NOTE — Telephone Encounter (Signed)
Left message for Jeffrey Lin giving verbal orders for PT 2 x week for 4 weeks then 1 x week for 3 weeks for balance and strengthening.

## 2018-04-19 ENCOUNTER — Other Ambulatory Visit: Payer: Self-pay | Admitting: Family Medicine

## 2018-04-20 ENCOUNTER — Ambulatory Visit: Payer: Self-pay

## 2018-04-23 DIAGNOSIS — Z466 Encounter for fitting and adjustment of urinary device: Secondary | ICD-10-CM

## 2018-04-23 DIAGNOSIS — M545 Low back pain: Secondary | ICD-10-CM

## 2018-04-23 DIAGNOSIS — R339 Retention of urine, unspecified: Secondary | ICD-10-CM

## 2018-04-23 DIAGNOSIS — I10 Essential (primary) hypertension: Secondary | ICD-10-CM

## 2018-04-23 DIAGNOSIS — Z8701 Personal history of pneumonia (recurrent): Secondary | ICD-10-CM

## 2018-04-23 DIAGNOSIS — K5900 Constipation, unspecified: Secondary | ICD-10-CM

## 2018-04-23 DIAGNOSIS — F259 Schizoaffective disorder, unspecified: Secondary | ICD-10-CM

## 2018-04-23 DIAGNOSIS — F329 Major depressive disorder, single episode, unspecified: Secondary | ICD-10-CM

## 2018-04-23 DIAGNOSIS — F0390 Unspecified dementia without behavioral disturbance: Secondary | ICD-10-CM

## 2018-04-23 DIAGNOSIS — F419 Anxiety disorder, unspecified: Secondary | ICD-10-CM

## 2018-04-23 DIAGNOSIS — M79605 Pain in left leg: Secondary | ICD-10-CM

## 2018-04-23 DIAGNOSIS — G8929 Other chronic pain: Secondary | ICD-10-CM

## 2018-04-23 DIAGNOSIS — Z6830 Body mass index (BMI) 30.0-30.9, adult: Secondary | ICD-10-CM

## 2018-04-24 ENCOUNTER — Ambulatory Visit: Payer: Self-pay

## 2018-04-25 ENCOUNTER — Other Ambulatory Visit: Payer: Self-pay | Admitting: Family Medicine

## 2018-05-07 ENCOUNTER — Ambulatory Visit
Admission: RE | Admit: 2018-05-07 | Discharge: 2018-05-07 | Disposition: A | Discharge status: Home / Self Care | Payer: PPO | Source: Ambulatory Visit | Attending: Urology | Admitting: Urology

## 2018-05-07 ENCOUNTER — Other Ambulatory Visit: Payer: Self-pay

## 2018-05-07 ENCOUNTER — Encounter: Payer: Self-pay | Admitting: Urology

## 2018-05-07 ENCOUNTER — Ambulatory Visit (INDEPENDENT_AMBULATORY_CARE_PROVIDER_SITE_OTHER): Payer: PPO | Admitting: Urology

## 2018-05-07 ENCOUNTER — Ambulatory Visit
Admission: RE | Admit: 2018-05-07 | Discharge: 2018-05-07 | Disposition: A | Discharge status: Home / Self Care | Payer: PPO | Attending: Urology | Admitting: Urology

## 2018-05-07 VITALS — BP 119/80 | HR 91 | Ht 76.0 in | Wt 250.0 lb

## 2018-05-07 DIAGNOSIS — N200 Calculus of kidney: Secondary | ICD-10-CM | POA: Diagnosis not present

## 2018-05-07 DIAGNOSIS — Z87442 Personal history of urinary calculi: Secondary | ICD-10-CM | POA: Diagnosis not present

## 2018-05-07 DIAGNOSIS — R82998 Other abnormal findings in urine: Secondary | ICD-10-CM | POA: Diagnosis not present

## 2018-05-07 LAB — BLADDER SCAN AMB NON-IMAGING

## 2018-05-07 NOTE — Patient Instructions (Signed)
Dietary Guidelines to Help Prevent Kidney Stones  Kidney stones are deposits of minerals and salts that form inside your kidneys. Your risk of developing kidney stones may be greater depending on your diet, your lifestyle, the medicines you take, and whether you have certain medical conditions. Most people can reduce their chances of developing kidney stones by following the instructions below. Depending on your overall health and the type of kidney stones you tend to develop, your dietitian may give you more specific instructions.  What are tips for following this plan?  Reading food labels   Choose foods with "no salt added" or "low-salt" labels. Limit your sodium intake to less than 1500 mg per day.   Choose foods with calcium for each meal and snack. Try to eat about 300 mg of calcium at each meal. Foods that contain 200-500 mg of calcium per serving include:  ? 8 oz (237 ml) of milk, fortified nondairy milk, and fortified fruit juice.  ? 8 oz (237 ml) of kefir, yogurt, and soy yogurt.  ? 4 oz (118 ml) of tofu.  ? 1 oz of cheese.  ? 1 cup (300 g) of dried figs.  ? 1 cup (91 g) of cooked broccoli.  ? 1-3 oz can of sardines or mackerel.   Most people need 1000 to 1500 mg of calcium each day. Talk to your dietitian about how much calcium is recommended for you.  Shopping   Buy plenty of fresh fruits and vegetables. Most people do not need to avoid fruits and vegetables, even if they contain nutrients that may contribute to kidney stones.   When shopping for convenience foods, choose:  ? Whole pieces of fruit.  ? Premade salads with dressing on the side.  ? Low-fat fruit and yogurt smoothies.   Avoid buying frozen meals or prepared deli foods.   Look for foods with live cultures, such as yogurt and kefir.  Cooking   Do not add salt to food when cooking. Place a salt shaker on the table and allow each person to add his or her own salt to taste.   Use vegetable protein, such as beans, textured vegetable  protein (TVP), or tofu instead of meat in pasta, casseroles, and soups.  Meal planning     Eat less salt, if told by your dietitian. To do this:  ? Avoid eating processed or premade food.  ? Avoid eating fast food.   Eat less animal protein, including cheese, meat, poultry, or fish, if told by your dietitian. To do this:  ? Limit the number of times you have meat, poultry, fish, or cheese each week. Eat a diet free of meat at least 2 days a week.  ? Eat only one serving each day of meat, poultry, fish, or seafood.  ? When you prepare animal protein, cut pieces into small portion sizes. For most meat and fish, one serving is about the size of one deck of cards.   Eat at least 5 servings of fresh fruits and vegetables each day. To do this:  ? Keep fruits and vegetables on hand for snacks.  ? Eat 1 piece of fruit or a handful of berries with breakfast.  ? Have a salad and fruit at lunch.  ? Have two kinds of vegetables at dinner.   Limit foods that are high in a substance called oxalate. These include:  ? Spinach.  ? Rhubarb.  ? Beets.  ? Potato chips and french fries.  ? Nuts.     If you regularly take a diuretic medicine, make sure to eat at least 1-2 fruits or vegetables high in potassium each day. These include:  ? Avocado.  ? Banana.  ? Orange, prune, carrot, or tomato juice.  ? Baked potato.  ? Cabbage.  ? Beans and split peas.  General instructions     Drink enough fluid to keep your urine clear or pale yellow. This is the most important thing you can do.   Talk to your health care provider and dietitian about taking daily supplements. Depending on your health and the cause of your kidney stones, you may be advised:  ? Not to take supplements with vitamin C.  ? To take a calcium supplement.  ? To take a daily probiotic supplement.  ? To take other supplements such as magnesium, fish oil, or vitamin B6.   Take all medicines and supplements as told by your health care provider.   Limit alcohol intake to no  more than 1 drink a day for nonpregnant women and 2 drinks a day for men. One drink equals 12 oz of beer, 5 oz of wine, or 1 oz of hard liquor.   Lose weight if told by your health care provider. Work with your dietitian to find strategies and an eating plan that works best for you.  What foods are not recommended?  Limit your intake of the following foods, or as told by your dietitian. Talk to your dietitian about specific foods you should avoid based on the type of kidney stones and your overall health.  Grains  Breads. Bagels. Rolls. Baked goods. Salted crackers. Cereal. Pasta.  Vegetables  Spinach. Rhubarb. Beets. Canned vegetables. Pickles. Olives.  Meats and other protein foods  Nuts. Nut butters. Large portions of meat, poultry, or fish. Salted or cured meats. Deli meats. Hot dogs. Sausages.  Dairy  Cheese.  Beverages  Regular soft drinks. Regular vegetable juice.  Seasonings and other foods  Seasoning blends with salt. Salad dressings. Canned soups. Soy sauce. Ketchup. Barbecue sauce. Canned pasta sauce. Casseroles. Pizza. Lasagna. Frozen meals. Potato chips. French fries.  Summary   You can reduce your risk of kidney stones by making changes to your diet.   The most important thing you can do is drink enough fluid. You should drink enough fluid to keep your urine clear or pale yellow.   Ask your health care provider or dietitian how much protein from animal sources you should eat each day, and also how much salt and calcium you should have each day.  This information is not intended to replace advice given to you by your health care provider. Make sure you discuss any questions you have with your health care provider.  Document Released: 05/04/2010 Document Revised: 12/19/2015 Document Reviewed: 12/19/2015  Elsevier Interactive Patient Education  2019 Elsevier Inc[employer]

## 2018-05-07 NOTE — Progress Notes (Signed)
   05/07/2018 10:09 AM   [name] [birth year] [id number]  Reason for visit: Follow up right distal ureteral stone, history of urinary retention  HPI: I saw [name] back in urology clinic today in follow-up.  Briefly, he is a 43 year old male with an extensive psychiatric history who I saw 04/16/2018 after he was hospitalized for pneumonia complicated by urinary retention requiring Foley placement.  He passed a void trial at that visit with a PVR of 7 cc.  He was also on Flomax for an asymptomatic 3 mm right distal ureteral stone that was incidentally found on CT scan and confirmed on KUB.  Prostate measured 32 cc on his CT scan.  He denies any flank pain or difficulty voiding today.  He is voiding with a strong stream.  PVR in clinic today is 20 cc.  He denies any hematuria, dysuria, urgency, or frequency.  KUB today shows interval passage of his small right distal ureteral stone.   ROS: Please see flowsheet from today's date for complete review of systems.  Physical Exam: BP 119/80   Pulse 91   Ht 6\' 4"  (1.93 m)   Wt 250 lb (113.4 kg)   BMI 30.43 kg/m    Constitutional:  Alert and oriented, No acute distress. Respiratory: Normal respiratory effort, no increased work of breathing. GI: Abdomen is soft, nontender, nondistended, no abdominal masses GU: No CVA tenderness Skin: No rashes, bruises or suspicious lesions. Neurologic: Grossly intact, no focal deficits, moving all 4 extremities. Psychiatric: Normal mood and affect  Pertinent Imaging: I have personally reviewed the KUB today, no right distal ureteral stone seen  Assessment & Plan:   In summary, the patient is a 43 year old male with extensive psychiatric history recently hospitalized for pneumonia complicated by urinary retention requiring Foley placement, as well as incidentally found right 3 mm distal ureteral stone.  His catheter is since been removed and he is voiding well on Flomax with a low PVR, and his  small right distal ureteral stone has passed and is no longer visible on KUB today.  I suspect his urinary retention was related to his pneumonia, as opposed to true BPH in this young patient.  I recommended stopping Flomax at this time.  He can always resume Flomax in the future if his urinary symptoms return.  -RTC 1 year for PVR -Consider resuming Flomax if worsening urinary symptoms or recurrent retention  A total of 15 minutes were spent face-to-face with the patient, greater than 50% was spent in patient education, counseling, and coordination of care regarding history of urinary retention and right ureteral stone.   Sondra Come, MD  [facility] [address], [address] [place], [place] [address] [phone]

## 2018-05-08 ENCOUNTER — Other Ambulatory Visit: Payer: Self-pay | Admitting: Family Medicine

## 2018-05-08 NOTE — Telephone Encounter (Signed)
Last office visit 04/14/2018 for hospital follow up.  Last refilled 04/14/2018 for #60 with no refills.  CPE scheduled for 08/11/2018.

## 2018-05-12 ENCOUNTER — Telehealth: Payer: Self-pay | Admitting: Urology

## 2018-05-12 NOTE — Telephone Encounter (Signed)
Pt called and states that he has just noticed some brown spots on his testicles, he states that he pulled one off thinking it was dirt and it bled for a while. He wants to know who he should see for advice.Please advise.

## 2018-05-12 NOTE — Telephone Encounter (Signed)
Returned call to patient and advised that a dermatologist handles skin related issues. Patient verbalized understanding.

## 2018-05-14 ENCOUNTER — Encounter: Payer: Self-pay | Admitting: *Deleted

## 2018-05-14 ENCOUNTER — Encounter: Payer: Self-pay | Admitting: Neurology

## 2018-05-14 ENCOUNTER — Other Ambulatory Visit: Payer: Self-pay

## 2018-05-14 ENCOUNTER — Other Ambulatory Visit: Payer: Self-pay | Admitting: *Deleted

## 2018-05-14 ENCOUNTER — Telehealth (INDEPENDENT_AMBULATORY_CARE_PROVIDER_SITE_OTHER): Payer: PPO | Admitting: Neurology

## 2018-05-14 VITALS — Ht 78.0 in | Wt 250.0 lb

## 2018-05-14 DIAGNOSIS — G6181 Chronic inflammatory demyelinating polyneuritis: Secondary | ICD-10-CM

## 2018-05-14 DIAGNOSIS — R29898 Other symptoms and signs involving the musculoskeletal system: Secondary | ICD-10-CM

## 2018-05-14 DIAGNOSIS — E519 Thiamine deficiency, unspecified: Secondary | ICD-10-CM

## 2018-05-14 DIAGNOSIS — E538 Deficiency of other specified B group vitamins: Secondary | ICD-10-CM

## 2018-05-14 DIAGNOSIS — M5416 Radiculopathy, lumbar region: Secondary | ICD-10-CM

## 2018-05-14 NOTE — Progress Notes (Signed)
MRI ordered

## 2018-05-14 NOTE — Progress Notes (Signed)
   Virtual Visit via Video Note The purpose of this virtual visit is to provide medical care while limiting exposure to the novel coronavirus.    Consent was obtained for video visit:  Yes.   Answered questions that patient had about telehealth interaction:  Yes.   I discussed the limitations, risks, security and privacy concerns of performing an evaluation and management service by telemedicine. I also discussed with the patient that there may be a patient responsible charge related to this service. The patient expressed understanding and agreed to proceed.  Pt location: Home Physician Location: office Name of referring provider:  Excell Seltzer, MD I connected with [name] at patients initiation/request on 05/14/2018 at  1:00 PM EDT by video enabled telemedicine application and verified that I am speaking with the correct person using two identifiers. Pt MRN:  [id number] Pt DOB:  24-Oct-1975 Video Participants:  [name]   History of Present Illness: This is a 43 y.o. male with history of schizoaffective disorder, depression, hypertension, prediabetes, and tobacco use returning for follow-up of CIDP.  Over the past month, he reports having worsening low back pain and tingling into the legs.  He has weakness with toe extension on the left.  He does not have any arm weakness or paresthesias.  He continues to have shooting pain in between his scapula.   Observations/Objective:   Vitals:   05/14/18 1247  Weight: 250 lb (113.4 kg)  Height: 6\' 6"  (1.981 m)   Patient is awake, alert, and appears comfortable.  Severely blunted affect oriented x 4.   Extraocular muscles are intact. No ptosis.  Face is symmetric.  Speech is not dysarthric.  Antigravity in all extremities.  He is able to dorsiflex and plantarflex the feet.  Toe extensor range of motion is limited.  No pronator drift.   Assessment and Plan:  1.  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  He was started on IVMP in July 2019 and had improved left foot strength and return of distal reflexes, so steroid infusions were stopped in February.  He continues to have painful paresthesias and takes gabapentin 600mg  three times daily.  2.  Low back pain radiating into the legs - new.  Recommend MRI lumbar spine wwo contrast to evaluate for structural pathology, such as lumbar radiculopathy or nerve root enhancement.  I would not expect CIDP to manifest with ongoing low back pain, and the fact that his reflexes are improved, may suggest upper motor neuron involvement (spinal stenosis).  If MRI lumbar spine is unrevealing, he will need MRI thoracic spine given his mid back pain.  3.  Vitamin B12, vitamin B1, and folate deficiency.    Continue vitamin B1 daily, thiamine 100mg  daily, and folic acid 1mg  daily.    Follow Up Instructions:   I discussed the assessment and treatment plan with the patient. The patient was provided an opportunity to ask questions and all were answered. The patient agreed with the plan and demonstrated an understanding of the instructions.   The patient was advised to call back or seek an in-person evaluation if the symptoms worsen or if the condition fails to improve as anticipated.  Follow-up after testing   Glendale Chard, DO

## 2018-05-22 ENCOUNTER — Other Ambulatory Visit: Payer: Self-pay | Admitting: Family Medicine

## 2018-05-22 NOTE — Telephone Encounter (Signed)
Last office visit 04/14/2018 for hospital follow up.  Last refilled 05/08/2018 for #60 with no refills.  CPE scheduled for 08/11/2018.

## 2018-05-25 ENCOUNTER — Other Ambulatory Visit: Payer: Self-pay

## 2018-05-25 MED ORDER — TAMSULOSIN HCL 0.4 MG PO CAPS: 0.4000 mg | ORAL_CAPSULE | Freq: Every day | ORAL | 6 refills | Status: DC

## 2018-05-28 ENCOUNTER — Other Ambulatory Visit: Payer: Self-pay | Admitting: Family Medicine

## 2018-05-28 MED ORDER — FUROSEMIDE 20 MG PO TABS: 40.0000 mg | ORAL_TABLET | Freq: Every day | ORAL | 1 refills | Status: DC

## 2018-05-28 NOTE — Telephone Encounter (Signed)
Patient stated that he needs refill on his LASIX.   The pharmacy advised him that it was to early to refill and he would need to contact our office for a new updated prescription   Patient stated he is suppose to be taking this 2x daily now and the prescription at the pharmacy is still written for 1x daily  Patient has a few tablets left.   BEST NUMBER FOR PATIENT [phone]     [facility] [address]- [place]

## 2018-06-01 ENCOUNTER — Other Ambulatory Visit: Payer: Self-pay

## 2018-06-03 ENCOUNTER — Ambulatory Visit
Admission: RE | Admit: 2018-06-03 | Discharge: 2018-06-03 | Disposition: A | Discharge status: Home / Self Care | Payer: PPO | Source: Ambulatory Visit | Attending: Neurology | Admitting: Neurology

## 2018-06-03 ENCOUNTER — Other Ambulatory Visit: Payer: Self-pay

## 2018-06-03 DIAGNOSIS — G6181 Chronic inflammatory demyelinating polyneuritis: Secondary | ICD-10-CM

## 2018-06-03 DIAGNOSIS — M5416 Radiculopathy, lumbar region: Secondary | ICD-10-CM

## 2018-06-03 DIAGNOSIS — M5136 Other intervertebral disc degeneration, lumbar region: Secondary | ICD-10-CM | POA: Diagnosis not present

## 2018-06-03 MED ORDER — GADOBENATE DIMEGLUMINE 529 MG/ML IV SOLN
20.0000 mL | Freq: Once | INTRAVENOUS | Status: AC | PRN
  Administered 2018-06-03: 20 mL via INTRAVENOUS

## 2018-06-04 ENCOUNTER — Other Ambulatory Visit: Payer: Self-pay

## 2018-06-04 DIAGNOSIS — G6181 Chronic inflammatory demyelinating polyneuritis: Secondary | ICD-10-CM

## 2018-06-04 DIAGNOSIS — M5416 Radiculopathy, lumbar region: Secondary | ICD-10-CM

## 2018-06-08 ENCOUNTER — Other Ambulatory Visit: Payer: Self-pay

## 2018-06-08 ENCOUNTER — Ambulatory Visit (INDEPENDENT_AMBULATORY_CARE_PROVIDER_SITE_OTHER): Payer: PPO | Admitting: Orthotics

## 2018-06-08 ENCOUNTER — Ambulatory Visit: Payer: PPO | Admitting: Podiatry

## 2018-06-08 VITALS — Temp 96.6°F

## 2018-06-08 DIAGNOSIS — G629 Polyneuropathy, unspecified: Secondary | ICD-10-CM

## 2018-06-08 DIAGNOSIS — B351 Tinea unguium: Secondary | ICD-10-CM | POA: Diagnosis not present

## 2018-06-08 DIAGNOSIS — M722 Plantar fascial fibromatosis: Secondary | ICD-10-CM | POA: Diagnosis not present

## 2018-06-08 DIAGNOSIS — M79674 Pain in right toe(s): Secondary | ICD-10-CM

## 2018-06-08 DIAGNOSIS — M7742 Metatarsalgia, left foot: Secondary | ICD-10-CM | POA: Diagnosis not present

## 2018-06-08 DIAGNOSIS — M79675 Pain in left toe(s): Secondary | ICD-10-CM | POA: Diagnosis not present

## 2018-06-08 DIAGNOSIS — M7741 Metatarsalgia, right foot: Secondary | ICD-10-CM | POA: Diagnosis not present

## 2018-06-08 NOTE — Progress Notes (Signed)

## 2018-06-09 NOTE — Progress Notes (Signed)
Subjective: 43 year old male presents the office today for concerns of bilateral foot pain.  He states he still has some pain to the heels as well as the arch of his foot.  He also still taking gabapentin for neuropathy, nerve pain.  He states that he does not get significant relief from this he takes 3 gabapentin's at a time.  He describes a burning, cold to his feet.  No claudication symptoms.  He has been to follow-up with Dr. Allena Katz of neurology.  The gabapentin was originally prescribed by his psychiatrist.  Also his nails are thickened elongated asking me to be trimmed. Denies any systemic complaints such as fevers, chills, nausea, vomiting. No acute changes since last appointment, and no other complaints at this time.   Objective: AAO x3, NAD DP/PT pulses palpable bilaterally, CRT less than 3 seconds Subjective back in some discomfort along the medial band plantar fashion the arch of the foot there is no tenderness today.  No pain in the heel today but he does occasionally get pain to the anterior portion of calcaneus.  No pain with lateral compression of calcaneus.  No pain on the Achilles tendon.  There is no edema, erythema.   Nails are hypertrophic, dystrophic, brittle, discolored, elongated 10. No surrounding redness or drainage. Tenderness nails 1-5 bilaterally. No open lesions or pre-ulcerative lesions are identified today. No open lesions or pre-ulcerative lesions.  No pain with calf compression, swelling, warmth, erythema  Assessment: Symptomatic onychomycosis, symptomatic neuropathy, plantar fasciitis  Plan: -All treatment options discussed with the patient including all alternatives, risks, complications.  -He did see Rick for orthotics today.  Do think if no better support will be helpful.  Continue with shoe modifications and stretching exercises.  We discussed a steroid injection will be held off today because his pain is minimal. -Continue gabapentin as prescribed.  Discussed  with him taking medication as prescribed do not try to take extra without the permission of his psychiatrist/neurologist. We discussed a TENS unit vs neurogenix at PT. He will consider this.  -Minimal sharp debridement x10 without any complications or bleeding. -Patient encouraged to call the office with any questions, concerns, change in symptoms.   Vivi Barrack DPM

## 2018-06-11 ENCOUNTER — Telehealth: Payer: Self-pay | Admitting: Neurology

## 2018-06-11 NOTE — Telephone Encounter (Signed)
Dr. Allena Katz, Will you please look at Dr. Kenna Gilbert last OV note. He wanted pt to get permission from you before increasing Gabapentin for his plantar fascitis. He is currently taking 600mg  1-2 qid.

## 2018-06-11 NOTE — Telephone Encounter (Signed)
Patient called wanting to know if Dr. Loreta Ave had contacted Dr. Allena Katz regarding increasing is Gabapentin? Please Call. Thanks

## 2018-06-11 NOTE — Telephone Encounter (Signed)
He should not be taking more than 3600mg /d, as this is the maximal dose of this medication per day.  If he is taking 1200mg  four times daily, this is too much and he needs to reduce it to 1200mg  three times daily.    If he is having ongoing pain, we can offer Cymbalta 30mg  daily in addition to gabapentin 1200mg  three times daily.

## 2018-06-12 ENCOUNTER — Ambulatory Visit: Payer: Self-pay | Admitting: Neurology

## 2018-06-12 MED ORDER — DULOXETINE HCL 30 MG PO CPEP: 30.0000 mg | ORAL_CAPSULE | Freq: Every day | ORAL | 3 refills | Status: DC

## 2018-06-12 NOTE — Telephone Encounter (Signed)
Pt states that he is taking 8 tablets per day of 600mg  Gabapentin.  I instructed him to decrease that to no more than 3600mg  a day. He understood.  He agrees to try Cymbalta 30mg  daily.  I will [facility] in new Rx [facility] [place]. Pt will call back in 2 weeks with an update.

## 2018-06-13 ENCOUNTER — Ambulatory Visit
Admission: RE | Admit: 2018-06-13 | Discharge: 2018-06-13 | Disposition: A | Discharge status: Home / Self Care | Payer: PPO | Source: Ambulatory Visit | Attending: Neurology | Admitting: Neurology

## 2018-06-13 ENCOUNTER — Other Ambulatory Visit: Payer: PPO

## 2018-06-13 ENCOUNTER — Other Ambulatory Visit: Payer: Self-pay

## 2018-06-13 DIAGNOSIS — M546 Pain in thoracic spine: Secondary | ICD-10-CM | POA: Diagnosis not present

## 2018-06-13 DIAGNOSIS — G6181 Chronic inflammatory demyelinating polyneuritis: Secondary | ICD-10-CM

## 2018-06-13 DIAGNOSIS — M5416 Radiculopathy, lumbar region: Secondary | ICD-10-CM

## 2018-06-25 ENCOUNTER — Other Ambulatory Visit: Payer: Self-pay | Admitting: Family Medicine

## 2018-06-26 ENCOUNTER — Other Ambulatory Visit: Payer: Self-pay

## 2018-06-26 NOTE — Telephone Encounter (Signed)
Last office visit 04/14/2018 for hospital follow up.  Last refilled 05/22/2018 for #60 with no refills.  CPE scheduled for 08/11/2018.

## 2018-06-29 ENCOUNTER — Other Ambulatory Visit: Payer: Self-pay

## 2018-06-29 NOTE — Telephone Encounter (Signed)
Duplicate request

## 2018-06-30 ENCOUNTER — Other Ambulatory Visit: Payer: Self-pay

## 2018-06-30 ENCOUNTER — Ambulatory Visit (INDEPENDENT_AMBULATORY_CARE_PROVIDER_SITE_OTHER): Payer: PPO | Admitting: Orthotics

## 2018-06-30 DIAGNOSIS — M722 Plantar fascial fibromatosis: Secondary | ICD-10-CM

## 2018-06-30 DIAGNOSIS — G629 Polyneuropathy, unspecified: Secondary | ICD-10-CM

## 2018-06-30 MED ORDER — METHOCARBAMOL 750 MG PO TABS: 750.0000 mg | ORAL_TABLET | Freq: Two times a day (BID) | ORAL | 1 refills | Status: DC

## 2018-07-20 ENCOUNTER — Other Ambulatory Visit: Payer: Self-pay | Admitting: Family Medicine

## 2018-07-21 ENCOUNTER — Other Ambulatory Visit: Payer: Self-pay | Admitting: Family Medicine

## 2018-07-21 NOTE — Telephone Encounter (Signed)
Last office visit 04/14/2018 for hospital follow up.  Last refilled 06/25/2018 for #28 with no refills.  CPE 08/11/2018.

## 2018-08-01 ENCOUNTER — Other Ambulatory Visit: Payer: Self-pay | Admitting: Neurology

## 2018-08-05 ENCOUNTER — Other Ambulatory Visit (INDEPENDENT_AMBULATORY_CARE_PROVIDER_SITE_OTHER): Payer: PPO

## 2018-08-05 ENCOUNTER — Telehealth: Payer: Self-pay | Admitting: Family Medicine

## 2018-08-05 ENCOUNTER — Ambulatory Visit: Payer: Self-pay

## 2018-08-05 DIAGNOSIS — E7800 Pure hypercholesterolemia, unspecified: Secondary | ICD-10-CM

## 2018-08-05 DIAGNOSIS — E559 Vitamin D deficiency, unspecified: Secondary | ICD-10-CM | POA: Diagnosis not present

## 2018-08-05 DIAGNOSIS — R7309 Other abnormal glucose: Secondary | ICD-10-CM

## 2018-08-05 LAB — COMPREHENSIVE METABOLIC PANEL
ALT: 16 U/L (ref 0–53)
AST: 14 U/L (ref 0–37)
Albumin: 4.4 g/dL (ref 3.5–5.2)
Alkaline Phosphatase: 127 U/L — ABNORMAL HIGH (ref 39–117)
BUN: 6 mg/dL (ref 6–23)
CO2: 41 mEq/L — ABNORMAL HIGH (ref 19–32)
Calcium: 9.6 mg/dL (ref 8.4–10.5)
Chloride: 93 mEq/L — ABNORMAL LOW (ref 96–112)
Creatinine, Ser: 1.02 mg/dL (ref 0.40–1.50)
GFR: 80.02 mL/min (ref >60.00)
Glucose, Bld: 103 mg/dL — ABNORMAL HIGH (ref 70–99)
Potassium: 3.8 mEq/L (ref 3.5–5.1)
Sodium: 142 mEq/L (ref 135–145)
Total Bilirubin: 0.3 mg/dL (ref 0.2–1.2)
Total Protein: 7 g/dL (ref 6.0–8.3)

## 2018-08-05 LAB — LIPID PANEL
Cholesterol: 178 mg/dL (ref 0–200)
HDL: 40.5 mg/dL (ref >39.00)
NonHDL: 137.2
Total CHOL/HDL Ratio: 4
Triglycerides: 257 mg/dL — ABNORMAL HIGH (ref 0.0–149.0)
VLDL: 51.4 mg/dL — ABNORMAL HIGH (ref 0.0–40.0)

## 2018-08-05 LAB — LDL CHOLESTEROL, DIRECT: Direct LDL: 111 mg/dL

## 2018-08-05 LAB — VITAMIN D 25 HYDROXY (VIT D DEFICIENCY, FRACTURES): VITD: 59.75 ng/mL (ref 30.00–100.00)

## 2018-08-05 LAB — HEMOGLOBIN A1C: Hgb A1c MFr Bld: 6.4 % (ref 4.6–6.5)

## 2018-08-05 NOTE — Telephone Encounter (Signed)
Orders

## 2018-08-11 ENCOUNTER — Ambulatory Visit (INDEPENDENT_AMBULATORY_CARE_PROVIDER_SITE_OTHER): Payer: PPO | Admitting: Family Medicine

## 2018-08-11 ENCOUNTER — Encounter: Payer: Self-pay | Admitting: Family Medicine

## 2018-08-11 ENCOUNTER — Other Ambulatory Visit: Payer: Self-pay

## 2018-08-11 VITALS — BP 122/86 | HR 91 | Temp 98.6°F | Ht 76.5 in | Wt 272.2 lb

## 2018-08-11 DIAGNOSIS — F251 Schizoaffective disorder, depressive type: Secondary | ICD-10-CM | POA: Diagnosis not present

## 2018-08-11 DIAGNOSIS — I10 Essential (primary) hypertension: Secondary | ICD-10-CM

## 2018-08-11 DIAGNOSIS — R7309 Other abnormal glucose: Secondary | ICD-10-CM | POA: Diagnosis not present

## 2018-08-11 DIAGNOSIS — E559 Vitamin D deficiency, unspecified: Secondary | ICD-10-CM

## 2018-08-11 DIAGNOSIS — Z0000 Encounter for general adult medical examination without abnormal findings: Secondary | ICD-10-CM

## 2018-08-11 DIAGNOSIS — E7800 Pure hypercholesterolemia, unspecified: Secondary | ICD-10-CM

## 2018-08-11 DIAGNOSIS — M549 Dorsalgia, unspecified: Secondary | ICD-10-CM | POA: Diagnosis not present

## 2018-08-11 MED ORDER — TIZANIDINE HCL 2 MG PO CAPS: 2.0000 mg | ORAL_CAPSULE | Freq: Three times a day (TID) | ORAL | 0 refills | Status: DC

## 2018-08-11 MED ORDER — METAXALONE 400 MG PO TABS: 400.0000 mg | ORAL_TABLET | Freq: Three times a day (TID) | ORAL | 0 refills | Status: DC

## 2018-08-11 NOTE — Assessment & Plan Note (Signed)
tresolved on supplement

## 2018-08-11 NOTE — Assessment & Plan Note (Signed)
EWork on low carb diet. Stop soda and starchy foods. Reviewed diet changes needed.

## 2018-08-11 NOTE — Assessment & Plan Note (Signed)
Will try a different muscle relaxant and start home PT.

## 2018-08-11 NOTE — Patient Instructions (Addendum)
Stop  Robaxin and try a trial of tizanidine 2 mg three times daily.  Start upper back stretching.  Quit smoking!  Get back to regular exercise and low carb diet.

## 2018-08-11 NOTE — Assessment & Plan Note (Signed)
Followed by Dr Kaur.   

## 2018-08-11 NOTE — Assessment & Plan Note (Signed)
Well controlled. Continue current medication.  

## 2018-08-11 NOTE — Assessment & Plan Note (Signed)
Good control on atorvastatin. 

## 2018-08-11 NOTE — Progress Notes (Signed)
Chief Complaint  Patient presents with  . Medicare Wellness    History of Present Illness: HPI   The patient presents for annual medicare wellness, complete physical and review of chronic health problems. He/She also has the following acute concerns today: upper back pain  I have personally reviewed the Medicare Annual Wellness questionnaire and have noted 1. The patient's medical and social history 2. Their use of alcohol, tobacco or illicit drugs 3. Their current medications and supplements 4. The patient's functional ability including ADL's, fall risks, home safety risks and hearing or visual             impairment. 5. Diet and physical activities 6. Evidence for depression or mood disorders 7.         Updated provider list Cognitive evaluation was performed and recorded on pt medicare questionnaire form. The patients weight, height, BMI and visual acuity have been recorded in the chart  I have made referrals, counseling and provided education to the patient based review of the above and I have provided the pt with a written personalized care plan for preventive services.   Documentation of this information was scanned into the electronic record under the media tab.   Advance directives and end of life planning reviewed in detail with patient and documented in EMR. Patient given handout on advance care directives if needed. HCPOA and living will updated if needed.   Hearing Screening   125Hz  250Hz  500Hz  1000Hz  2000Hz  3000Hz  4000Hz  6000Hz  8000Hz   Right ear:   20 20 20  20     Left ear:   20 20 20  20       Visual Acuity Screening   Right eye Left eye Both eyes  Without correction: 20/50 20/50 20/40   With correction:       Fall Risk  08/11/2018 05/14/2018 03/06/2018 03/06/2018 09/02/2017  Falls in the past year? 0 1 1 0 Yes  Number falls in past yr: - 1 1 0 1  Injury with Fall? - 0 0 0 No  Risk Factor Category  - - - - -  Risk for fall due to : - Impaired balance/gait;Impaired  mobility Impaired balance/gait;Impaired mobility - Impaired balance/gait;Impaired mobility  Follow up - Falls evaluation completed;Education provided;Falls prevention discussed Falls evaluation completed;Education provided Falls evaluation completed Falls evaluation completed;Education provided;Falls prevention discussed   Depression screen [facility] 2[facility]9 08/11/2018 03/20/2017  Decreased Interest 0 1  Down, Depressed, Hopeless 1 1  PHQ - 2 Score 1 2  Altered sleeping - 1  Tired, decreased energy - 1  Change in appetite - 1  Feeling bad or failure about yourself  - 1  Trouble concentrating - 1  Moving slowly or fidgety/restless - 1  Suicidal thoughts - 2  PHQ-9 Score - 10  Difficult doing work/chores - Somewhat difficult  Some encounter information is confidential and restricted. Go to Review Flowsheets activity to see all data.  Some recent data might be hidden   Hypertension:   Good control.  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 he blames risperdal.  Prediabetes  Lab Results  Component Value Date   HGBA1C 6.4 08/05/2018   Schizoaffective disorder: Followed by psychiatry Dr. Evelene CroonKaur. Now started on risperdal  Continued lowback pain, now with upper back pain: Did fall 3 months ago. Started left shoulder blade with arm movement at that time. He requests a different muscle relaxer than robaxin or  cyclobenzaprine  COVID 19 screen No recent travel or known exposure to COVID19 The patient denies respiratory symptoms of COVID 19 at this time.  The importance of social distancing was discussed today.   Review of Systems  Constitutional: Negative for chills and fever.  HENT: Negative for congestion and ear pain.   Eyes: Negative for pain and redness.  Respiratory: Negative for cough and shortness of breath.   Cardiovascular: Negative for chest pain, palpitations and leg swelling.  Gastrointestinal: Negative for abdominal pain, blood in stool, constipation, diarrhea, nausea and vomiting.  Genitourinary: Negative for dysuria.  Musculoskeletal: Negative for falls and myalgias.  Skin: Negative for rash.  Neurological: Negative for dizziness.  Psychiatric/Behavioral: Negative for depression. The patient is not nervous/anxious.       Past Medical History:  Diagnosis Date  . Anxiety   . Chronic leg pain   . Depression   . Essential hypertension, benign 10/20/2015  . Schizoaffective disorder (HCC)     reports that he has been smoking cigarettes. He started smoking about 22 years ago. He has a 23.00 pack-year smoking history. He has quit using smokeless tobacco. He reports that he does not drink alcohol or use drugs.   Current Outpatient Medications:  .  amantadine (SYMMETREL) 100 MG capsule, Take 100 mg by mouth 2 (two) times daily., Disp: , Rfl:  .  atorvastatin (LIPITOR) 40 MG tablet, TAKE 1 TABLET BY MOUTH EVERY DAY, Disp: 90 tablet, Rfl: 3 .  diazepam (VALIUM) 10 MG tablet, Take 10 mg by mouth 4 (four) times daily., Disp: , Rfl:  .  DULoxetine (CYMBALTA) 30 MG capsule, TAKE 1 CAPSULE BY MOUTH EVERY DAY, Disp: 90 capsule, Rfl: 0 .  fluticasone (FLONASE) 50 MCG/ACT nasal spray, Place 2 sprays into both nostrils daily. (Patient taking differently: Place 2 sprays into both nostrils daily. As needed), Disp: 1 g, Rfl: 0 .  furosemide (LASIX) 20 MG tablet, Take 2 tablets (40 mg total) by  mouth daily., Disp: 180 tablet, Rfl: 1 .  losartan (COZAAR) 100 MG tablet, TAKE 1 TABLET (100 MG TOTAL) BY MOUTH DAILY. PLEASE SCHEDULE PHYSICAL (Patient taking differently: Take 100 mg by mouth daily. As needed), Disp: 90 tablet, Rfl: 0 .  methocarbamol (ROBAXIN) 750 MG tablet, Take 1 tablet (750 mg total) by mouth 2 (two) times daily. (Patient taking differently: Take 750 mg by mouth 2 (two) times daily. Takes as needed), Disp: 60 tablet, Rfl: 1 .  Metoprolol Succinate 50 MG CS24, Take by mouth daily., Disp: , Rfl:  .  omeprazole (PRILOSEC) 20 MG capsule, Take 20 mg by mouth daily as needed., Disp: , Rfl:  .  potassium chloride (K-DUR) 10 MEQ tablet, TAKE 1 TABLET BY MOUTH EVERY DAY, Disp: 90 tablet, Rfl: 1 .  propranolol (INDERAL) 20 MG tablet, Take 20 mg by mouth 4 (four) times daily., Disp: , Rfl:  .  Vitamin D, Ergocalciferol, (DRISDOL) 50000 units CAPS capsule, Take 1 capsule (50,000 Units total) by mouth 2 (two) times a week., Disp: , Rfl:  .  amphetamine-dextroamphetamine (ADDERALL)  20 MG tablet, Take 1 tablet by mouth 2 (two) times daily., Disp: , Rfl:  .  gabapentin (NEURONTIN) 600 MG tablet, Take 600 mg by mouth 3 (three) times daily. Patient is taking 2 tablets TID, Disp: , Rfl:  .  risperiDONE (RISPERDAL) 1 MG tablet, Take 1 tablet by mouth at bedtime., Disp: , Rfl:    Observations/Objective: Blood pressure 122/86, pulse 91, temperature 98.6 F (37 C), temperature source Temporal, height 6' 4.5" (1.943 m), weight 272 lb 4 oz (123.5 kg), SpO2 93 %.  Physical Exam Constitutional:      General: He is not in acute distress.    Appearance: Normal appearance. He is well-developed. He is not ill-appearing or toxic-appearing.  HENT:     Head: Normocephalic and atraumatic.     Right Ear: Hearing, tympanic membrane, ear canal and external ear normal.     Left Ear: Hearing, tympanic membrane, ear canal and external ear normal.     Nose: Nose normal.     Mouth/Throat:     Pharynx: Uvula  midline.  Eyes:     General: Lids are normal. Lids are everted, no foreign bodies appreciated.     Conjunctiva/sclera: Conjunctivae normal.     Pupils: Pupils are equal, round, and reactive to light.  Neck:     Musculoskeletal: Normal range of motion and neck supple.     Thyroid: No thyroid mass or thyromegaly.     Vascular: No carotid bruit.     Trachea: Trachea and phonation normal.  Cardiovascular:     Rate and Rhythm: Normal rate and regular rhythm.     Pulses: Normal pulses.     Heart sounds: S1 normal and S2 normal. No murmur. No gallop.   Pulmonary:     Breath sounds: Normal breath sounds. No wheezing, rhonchi or rales.  Abdominal:     General: Bowel sounds are normal.     Palpations: Abdomen is soft.     Tenderness: There is no abdominal tenderness. There is no guarding or rebound.     Hernia: No hernia is present. There is no hernia in the left inguinal area.  Genitourinary:    Penis: Normal. No paraphimosis or tenderness.      Scrotum/Testes: Normal.        Right: Mass or tenderness not present.        Left: Mass or tenderness not present.     Prostate: Normal. Not enlarged and not tender.     Rectum: Guaiac result negative. No mass, tenderness, anal fissure, external hemorrhoid or internal hemorrhoid. Normal anal tone.  Musculoskeletal:     Cervical back: Normal.     Thoracic back: He exhibits decreased range of motion.     Comments: ttp in upper back over paraspinous muscles  bilaterally  Lymphadenopathy:     Cervical: No cervical adenopathy.  Skin:    General: Skin is warm and dry.     Findings: No rash.  Neurological:     Mental Status: He is alert.     Cranial Nerves: No cranial nerve deficit.     Sensory: No sensory deficit.     Gait: Gait normal.     Deep Tendon Reflexes: Reflexes are normal and symmetric.  Psychiatric:        Speech: Speech normal.        Behavior: Behavior normal.        Judgment: Judgment normal.      Assessment and Plan   The  patient's preventative maintenance  and recommended screening tests for an annual wellness exam were reviewed in full today. Brought up to date unless services declined.  Counselled on the importance of diet, exercise, and its role in overall health and mortality. The patient's FH and SH was reviewed, including their home life, tobacco status, and drug and alcohol status.   Vaccines:  Uptodate tdap. HIV: 3.2020 No early family history of colon cancer/prostate. Smoker.. 1/2 pack a day now    Eliezer Lofts, MD

## 2018-08-27 ENCOUNTER — Telehealth: Payer: Self-pay | Admitting: Neurology

## 2018-08-27 NOTE — Telephone Encounter (Signed)
Patient left vm wanting to speak with the nurse. Thanks!  °

## 2018-08-28 NOTE — Telephone Encounter (Signed)
Please have him set up VV to discuss his symptoms and management options.

## 2018-08-28 NOTE — Telephone Encounter (Signed)
Pt is schedule for a VV on 09-02-18  °

## 2018-08-28 NOTE — Telephone Encounter (Signed)
Jeffrey Lin,  Will you please call pt and schedule? Thank you.

## 2018-08-28 NOTE — Telephone Encounter (Signed)
Recent EMG on legs  Has warm sensation that goes down right leg.  The tingling has aslo come back in the right leg.  Makes bottom of both feet feel numb.  Only change in medication is a muscle relaxant. methocarbomal Which he stopped on 7/22  Continues Gabapentin 600mg  TID and cymbalta 30mg  qd  Thinks he made need to restart infusions.

## 2018-09-01 ENCOUNTER — Encounter: Payer: Self-pay | Admitting: Neurology

## 2018-09-02 ENCOUNTER — Other Ambulatory Visit: Payer: Self-pay

## 2018-09-02 ENCOUNTER — Telehealth (INDEPENDENT_AMBULATORY_CARE_PROVIDER_SITE_OTHER): Payer: PPO | Admitting: Neurology

## 2018-09-02 VITALS — BP 122/86 | Ht 77.0 in | Wt 270.0 lb

## 2018-09-02 DIAGNOSIS — G6181 Chronic inflammatory demyelinating polyneuritis: Secondary | ICD-10-CM

## 2018-09-02 NOTE — Progress Notes (Signed)
Order place Pt scheduled for 09/04/18 @ 8:00 am  [facility] 3rd floor  Pt aware

## 2018-09-02 NOTE — Addendum Note (Signed)
Addended by: Ranae Plumber on: 09/02/2018 02:58 PM   Modules accepted: Orders

## 2018-09-02 NOTE — Progress Notes (Signed)
Virtual Visit via Video Note The purpose of this virtual visit is to provide medical care while limiting exposure to the novel coronavirus.    Consent was obtained for video visit:  Yes.   Answered questions that patient had about telehealth interaction:  Yes.   I discussed the limitations, risks, security and privacy concerns of performing an evaluation and management service by telemedicine. I also discussed with the patient that there may be a patient responsible charge related to this service. The patient expressed understanding and agreed to proceed.  Pt location: Home Physician Location: office Name of referring provider:  Excell SeltzerBedsole, Amy E, MD I connected with Linde GillisBobby G Pardon at patients initiation/request on 09/02/2018 at  9:10 AM EDT by video enabled telemedicine application and verified that I am speaking with the correct person using two identifiers. Pt MRN:  [id number] Pt DOB:  03/09/1975 Video Participants:  Linde GillisBobby G Wassel   History of Present Illness: This is a 43 y.o. male with history of schizoaffective disorder, depression, hypertension, prediabetes, and tobacco use returning for follow-up of CIDP.  He reports having increased tingling and burning pain over the right leg and into the foot.  He does not have similar symptoms in the left leg.  He takes gabapentin 600 mg 3 times daily.  He denies any new weakness in the legs or feet.  He is able to dorsiflex and plantarflex the ankles and has toe extension and flexion, which remains slightly weaker on the left.  He is inquiring whether it would be possible to restart Solu-Medrol infusions, as he felt symptoms are better controlled when he was on this. His imaging of the thoracic and lumbar spine did not show any significant disease to explain his chronic back pain. He is compliant with taking his vitamin supplements.  Observations/Objective:   Vitals:   09/01/18 1313  BP: 122/86  Weight: 270 lb (122.5 kg)  Height: 6\' 5"  (1.956  m)   Patient is awake, alert, and appears comfortable.  Severely blunted affect oriented x 4.   Extraocular muscles are intact. No ptosis.  Face is symmetric.  Speech is not dysarthric.  Antigravity in all extremities.  Good range of motion with bilateral dorsiflexion and plantar flexion. He is able to extend and flex the toes some, there is reduced range of motion, but at least antigravity. No pronator drift.  MRI thoracic spine wwo contrast 06/13/2018: 1.  No acute osseous injury of the thoracic spine. 2. No thoracic spine disc protrusion, foraminal stenosis or central canal stenosis.  MRI lumbar spine wwo contrast 06/03/2018: 1. Mild lumbar degenerative change. 2. Small central disc protrusion L4-5 with mild subarticular stenosis bilaterally and mild left foraminal stenosis. 3. Unilateral pars defect to the right of L5 without significant stenosis. 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  Assessment and Plan:  1.  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  He completed IVMP from July 2019 - February 2020 and had improved left foot strength and return of reflexes.  Over the past several weeks, he has noticed increased right leg paresthesias and pain.  No worsening weakness or imbalance.  Restart IVMP 1g every 28 days Continue gabapentin 600mg  TID, may need to further titrate this if pain persists despite starting corticosteroids  2.  Chronic low back pain due to lumbar strain.  There is no significant structural disease on MRI thoracic or lumbar spine to explain pain.  3.  Vitamin B12, vitamin B1,  and folate deficiency.    He is compliant with vitamin B1 1048mcg daily, thiamine 100mg  daily, and folic acid 1mg  daily.  I will plan to check levels at his next visit.    Follow Up Instructions:   I discussed the assessment and treatment plan with the patient. The patient was provided an opportunity to ask questions and all  were answered. The patient agreed with the plan and demonstrated an understanding of the instructions.   The patient was advised to call back or seek an in-person evaluation if the symptoms worsen or if the condition fails to improve as anticipated.  Return to clinic in 3 months  Time spent:  28 min  [place], DO

## 2018-09-04 ENCOUNTER — Ambulatory Visit (HOSPITAL_COMMUNITY)
Admission: RE | Admit: 2018-09-04 | Discharge: 2018-09-04 | Disposition: A | Discharge status: Home / Self Care | Payer: PPO | Source: Ambulatory Visit | Attending: Neurology | Admitting: Neurology

## 2018-09-04 ENCOUNTER — Other Ambulatory Visit: Payer: Self-pay | Admitting: Family Medicine

## 2018-09-04 ENCOUNTER — Other Ambulatory Visit: Payer: Self-pay

## 2018-09-04 DIAGNOSIS — G6181 Chronic inflammatory demyelinating polyneuritis: Secondary | ICD-10-CM | POA: Insufficient documentation

## 2018-09-04 MED ORDER — SODIUM CHLORIDE 0.9 % IV SOLN
1000.0000 mg | INTRAVENOUS | Status: DC
  Administered 2018-09-04: 09:00:00 1000 mg via INTRAVENOUS
  Filled 2018-09-04: qty 1000

## 2018-09-04 MED ORDER — SODIUM CHLORIDE 0.9 % IV SOLN
INTRAVENOUS | Status: DC | PRN
  Administered 2018-09-04: 09:00:00 250 mL via INTRAVENOUS

## 2018-09-04 NOTE — Progress Notes (Signed)
PATIENT CARE CENTER NOTE   Provider:  Patel, Donika, DO   Procedure: IV Solu-medrol    Note: Patient received Solu-medrol infusion. Tolerated well. Vital signs stable. Discharge instructions given. Patient to come back every 28 days for infusion. Alert, oriented and ambulatory at discharge.   

## 2018-09-04 NOTE — Discharge Instructions (Signed)
Methylprednisolone Solution for Injection What is this medicine? METHYLPREDNISOLONE (meth ill pred NISS oh lone) is a corticosteroid. It is commonly used to treat inflammation of the skin, joints, lungs, and other organs. Common conditions treated include asthma, allergies, and arthritis. It is also used for other conditions, such as blood disorders and diseases of the adrenal glands. This medicine may be used for other purposes; ask your health care provider or pharmacist if you have questions. COMMON BRAND NAME(S): A-Methapred, Solu-Medrol What should I tell my health care provider before I take this medicine? They need to know if you have any of these conditions:  Cushing's syndrome  eye disease, vision problems  diabetes  glaucoma  heart disease  high blood pressure  infection (especially a virus infection such as chickenpox, cold sores, or herpes)  liver disease  mental illness  myasthenia gravis  osteoporosis  recently received or scheduled to receive a vaccine  seizures  stomach or intestine problems  thyroid disease  an unusual or allergic reaction to lactose, methylprednisolone, other medicines, foods, dyes, or preservatives  pregnant or trying to get pregnant  breast-feeding How should I use this medicine? This medicine is for injection or infusion into a vein. It is also for injection into a muscle. It is given by a health care professional in a hospital or clinic setting. Talk to your pediatrician regarding the use of this medicine in children. While this drug may be prescribed for selected conditions, precautions do apply. Overdosage: If you think you have taken too much of this medicine contact a poison control center or emergency room at once. NOTE: This medicine is only for you. Do not share this medicine with others. What if I miss a dose? This does not apply. What may interact with this medicine? Do not take this medicine with any of the following  medications:  alefacept  echinacea  iopamidol  live virus vaccines  metyrapone  mifepristone This medicine may also interact with the following medications:  amphotericin B  aspirin and aspirin-like medicines  certain antibiotics like erythromycin, clarithromycin, troleandomycin  certain medicines for diabetes  certain medicines for fungal infection like ketoconazole  certain medicines for seizures like carbamazepine, phenobarbital, phenytoin  certain medicines that treat or prevent blood clots like warfarin  cyclosporine  digoxin  diuretics  male hormones, like estrogens and birth control pills  isoniazid  NSAIDS, medicines for pain and inflammation, like ibuprofen or naproxen  other medicines for myasthenia gravis  rifampin  vaccines This list may not describe all possible interactions. Give your health care provider a list of all the medicines, herbs, non-prescription drugs, or dietary supplements you use. Also tell them if you smoke, drink alcohol, or use illegal drugs. Some items may interact with your medicine. What should I watch for while using this medicine? Tell your doctor or healthcare professional if your symptoms do not start to get better or if they get worse. Do not stop taking except on your doctor's advice. You may develop a severe reaction. Your doctor will tell you how much medicine to take. Your condition will be monitored carefully while you are receiving this medicine. This medicine may increase your risk of getting an infection. Tell your doctor or health care professional if you are around anyone with measles or chickenpox, or if you develop sores or blisters that do not heal properly. This medicine may increase blood sugar. Ask your healthcare provider if changes in diet or medicines are needed if you have diabetes. Tell   your doctor or health care professional right away if you have any change in your eyesight. Using this medicine for a  long time may increase your risk of low bone mass. Talk to your doctor about bone health. What side effects may I notice from receiving this medicine? Side effects that you should report to your doctor or health care professional as soon as possible:  allergic reactions like skin rash, itching or hives, swelling of the face, lips, or tongue  bloody or tarry stools  hallucination, loss of contact with reality  muscle cramps  muscle pain  palpitations  signs and symptoms of high blood sugar such as being more thirsty or hungry or having to urinate more than normal. You may also feel very tired or have blurry vision.  signs and symptoms of infection like fever or chills; cough; sore throat; pain or trouble passing urine  trouble passing urine Side effects that usually do not require medical attention (report to your doctor or health care professional if they continue or are bothersome):  changes in emotions or mood  constipation  diarrhea  excessive hair growth on the face or body  headache  nausea, vomiting  pain, redness, or irritation at site where injected  trouble sleeping  weight gain This list may not describe all possible side effects. Call your doctor for medical advice about side effects. You may report side effects to FDA at 1-800-FDA-1088. Where should I keep my medicine? This drug is given in a hospital or clinic and will not be stored at home. NOTE: This sheet is a summary. It may not cover all possible information. If you have questions about this medicine, talk to your doctor, pharmacist, or health care provider.  2020 Elsevier/Gold Standard (2017-10-09 09:12:19)  

## 2018-09-04 NOTE — Telephone Encounter (Signed)
Last office visit 08/11/2018 for Medicare Wellness.  Last refilled 08/11/2018 for #90 with no refills.  No future appointments.

## 2018-09-27 ENCOUNTER — Other Ambulatory Visit: Payer: Self-pay | Admitting: Family Medicine

## 2018-09-29 NOTE — Telephone Encounter (Signed)
Last office visit 08/11/2018 for Medicare Wellness.  Last refilled 09/04/2018 for #90 with no refills.  No future appointments with PCP.

## 2018-10-02 ENCOUNTER — Ambulatory Visit (HOSPITAL_COMMUNITY)
Admission: RE | Admit: 2018-10-02 | Discharge: 2018-10-02 | Disposition: A | Discharge status: Home / Self Care | Payer: PPO | Source: Ambulatory Visit | Attending: Neurology | Admitting: Neurology

## 2018-10-02 ENCOUNTER — Other Ambulatory Visit: Payer: Self-pay

## 2018-10-02 DIAGNOSIS — G6181 Chronic inflammatory demyelinating polyneuritis: Secondary | ICD-10-CM | POA: Diagnosis not present

## 2018-10-02 MED ORDER — SODIUM CHLORIDE 0.9 % IV SOLN
1000.0000 mg | INTRAVENOUS | Status: DC
  Administered 2018-10-02: 09:00:00 1000 mg via INTRAVENOUS
  Filled 2018-10-02: qty 8

## 2018-10-02 MED ORDER — SODIUM CHLORIDE 0.9 % IV SOLN
INTRAVENOUS | Status: DC | PRN
  Administered 2018-10-02: 08:00:00 250 mL via INTRAVENOUS

## 2018-10-02 NOTE — Discharge Instructions (Signed)
Methylprednisolone Solution for Injection What is this medicine? METHYLPREDNISOLONE (meth ill pred NISS oh lone) is a corticosteroid. It is commonly used to treat inflammation of the skin, joints, lungs, and other organs. Common conditions treated include asthma, allergies, and arthritis. It is also used for other conditions, such as blood disorders and diseases of the adrenal glands. This medicine may be used for other purposes; ask your health care provider or pharmacist if you have questions. COMMON BRAND NAME(S): A-Methapred, Solu-Medrol What should I tell my health care provider before I take this medicine? They need to know if you have any of these conditions:  Cushing's syndrome  eye disease, vision problems  diabetes  glaucoma  heart disease  high blood pressure  infection (especially a virus infection such as chickenpox, cold sores, or herpes)  liver disease  mental illness  myasthenia gravis  osteoporosis  recently received or scheduled to receive a vaccine  seizures  stomach or intestine problems  thyroid disease  an unusual or allergic reaction to lactose, methylprednisolone, other medicines, foods, dyes, or preservatives  pregnant or trying to get pregnant  breast-feeding How should I use this medicine? This medicine is for injection or infusion into a vein. It is also for injection into a muscle. It is given by a health care professional in a hospital or clinic setting. Talk to your pediatrician regarding the use of this medicine in children. While this drug may be prescribed for selected conditions, precautions do apply. Overdosage: If you think you have taken too much of this medicine contact a poison control center or emergency room at once. NOTE: This medicine is only for you. Do not share this medicine with others. What if I miss a dose? This does not apply. What may interact with this medicine? Do not take this medicine with any of the following  medications:  alefacept  echinacea  iopamidol  live virus vaccines  metyrapone  mifepristone This medicine may also interact with the following medications:  amphotericin B  aspirin and aspirin-like medicines  certain antibiotics like erythromycin, clarithromycin, troleandomycin  certain medicines for diabetes  certain medicines for fungal infection like ketoconazole  certain medicines for seizures like carbamazepine, phenobarbital, phenytoin  certain medicines that treat or prevent blood clots like warfarin  cyclosporine  digoxin  diuretics  male hormones, like estrogens and birth control pills  isoniazid  NSAIDS, medicines for pain and inflammation, like ibuprofen or naproxen  other medicines for myasthenia gravis  rifampin  vaccines This list may not describe all possible interactions. Give your health care provider a list of all the medicines, herbs, non-prescription drugs, or dietary supplements you use. Also tell them if you smoke, drink alcohol, or use illegal drugs. Some items may interact with your medicine. What should I watch for while using this medicine? Tell your doctor or healthcare professional if your symptoms do not start to get better or if they get worse. Do not stop taking except on your doctor's advice. You may develop a severe reaction. Your doctor will tell you how much medicine to take. Your condition will be monitored carefully while you are receiving this medicine. This medicine may increase your risk of getting an infection. Tell your doctor or health care professional if you are around anyone with measles or chickenpox, or if you develop sores or blisters that do not heal properly. This medicine may increase blood sugar. Ask your healthcare provider if changes in diet or medicines are needed if you have diabetes. Tell   your doctor or health care professional right away if you have any change in your eyesight. Using this medicine for a  long time may increase your risk of low bone mass. Talk to your doctor about bone health. What side effects may I notice from receiving this medicine? Side effects that you should report to your doctor or health care professional as soon as possible:  allergic reactions like skin rash, itching or hives, swelling of the face, lips, or tongue  bloody or tarry stools  hallucination, loss of contact with reality  muscle cramps  muscle pain  palpitations  signs and symptoms of high blood sugar such as being more thirsty or hungry or having to urinate more than normal. You may also feel very tired or have blurry vision.  signs and symptoms of infection like fever or chills; cough; sore throat; pain or trouble passing urine  trouble passing urine Side effects that usually do not require medical attention (report to your doctor or health care professional if they continue or are bothersome):  changes in emotions or mood  constipation  diarrhea  excessive hair growth on the face or body  headache  nausea, vomiting  pain, redness, or irritation at site where injected  trouble sleeping  weight gain This list may not describe all possible side effects. Call your doctor for medical advice about side effects. You may report side effects to FDA at 1-800-FDA-1088. Where should I keep my medicine? This drug is given in a hospital or clinic and will not be stored at home. NOTE: This sheet is a summary. It may not cover all possible information. If you have questions about this medicine, talk to your doctor, pharmacist, or health care provider.  2020 Elsevier/Gold Standard (2017-10-09 09:12:19)  

## 2018-10-02 NOTE — Progress Notes (Signed)
PATIENT CARE CENTER NOTE   Provider:  Patel, Donika, DO   Procedure: IV Solu-medrol    Note: Patient received Solu-medrol infusion. Tolerated well. Vital signs stable. Discharge instructions given. Patient to come back every 28 days for infusion. Alert, oriented and ambulatory at discharge.   

## 2018-10-05 ENCOUNTER — Other Ambulatory Visit: Payer: Self-pay | Admitting: Family Medicine

## 2018-10-12 ENCOUNTER — Other Ambulatory Visit: Payer: Self-pay

## 2018-10-12 NOTE — Telephone Encounter (Signed)
Last office visit 08/11/2018 for [place].  Last refilled 09/29/2018 for #90 with no refills.  No future appointments with PCP.

## 2018-10-13 ENCOUNTER — Other Ambulatory Visit: Payer: Self-pay

## 2018-10-13 MED ORDER — TIZANIDINE HCL 2 MG PO CAPS: 2.0000 mg | ORAL_CAPSULE | Freq: Three times a day (TID) | ORAL | 0 refills | Status: DC

## 2018-10-13 NOTE — Telephone Encounter (Signed)
Duplicate Request.  Awaiting Dr. Rometta Emery approval.

## 2018-10-14 NOTE — Telephone Encounter (Signed)
[facility] Night - Client Nonclinical Telephone Record AccessNurse Client [facility] Night - Client Client Site [place] Physician Eliezer Lofts - MD Contact Type Call Who Is Calling Patient / Member / Family / Caregiver Caller Name [place] Phone Number [phone] Patient Name Jeffrey Lin Patient DOB 1975/02/26 Call Type Message Only Information Provided Reason for Call Request for General Office Information Initial Comment Caller states that he had tried to get a refill on his muscle relaxers and his Mychart is saying it's been denied. Additional Comment Caller states that it is a new number. Office hours given. Call Closed By: Electa Sniff Transaction Date/Time: 10/13/2018 5:58:34 PM (ET)

## 2018-10-14 NOTE — Telephone Encounter (Signed)
Jeffrey Lin notified by phone that we received 2 request.  One was approved and the other was denied.  He states after he called he did get another message on his MyChart letting him know it had been approved and he has picked up his prescription.

## 2018-10-26 ENCOUNTER — Ambulatory Visit (HOSPITAL_COMMUNITY)
Admission: RE | Admit: 2018-10-26 | Discharge: 2018-10-26 | Disposition: A | Discharge status: Home / Self Care | Payer: PPO | Source: Ambulatory Visit | Attending: Neurology | Admitting: Neurology

## 2018-10-26 ENCOUNTER — Other Ambulatory Visit: Payer: Self-pay

## 2018-10-26 DIAGNOSIS — G6181 Chronic inflammatory demyelinating polyneuritis: Secondary | ICD-10-CM | POA: Insufficient documentation

## 2018-10-26 MED ORDER — SODIUM CHLORIDE 0.9 % IV SOLN
INTRAVENOUS | Status: DC | PRN
  Administered 2018-10-26: 250 mL via INTRAVENOUS

## 2018-10-26 MED ORDER — SODIUM CHLORIDE 0.9 % IV SOLN
1000.0000 mg | INTRAVENOUS | Status: DC
  Administered 2018-10-26: 10:00:00 1000 mg via INTRAVENOUS
  Filled 2018-10-26: qty 8

## 2018-10-26 NOTE — Progress Notes (Signed)
PATIENT CARE CENTER NOTE   Provider:  Patel, Donika, DO   Procedure: IV Solu-medrol    Note: Patient received Solu-medrol infusion. Tolerated well. Vital signs stable. Discharge instructions given. Patient to come back every 28 days for infusion. Alert, oriented and ambulatory at discharge.   

## 2018-10-26 NOTE — Discharge Instructions (Signed)
Methylprednisolone Solution for Injection What is this medicine? METHYLPREDNISOLONE (meth ill pred NISS oh lone) is a corticosteroid. It is commonly used to treat inflammation of the skin, joints, lungs, and other organs. Common conditions treated include asthma, allergies, and arthritis. It is also used for other conditions, such as blood disorders and diseases of the adrenal glands. This medicine may be used for other purposes; ask your health care provider or pharmacist if you have questions. COMMON BRAND NAME(S): A-Methapred, Solu-Medrol What should I tell my health care provider before I take this medicine? They need to know if you have any of these conditions:  Cushing's syndrome  eye disease, vision problems  diabetes  glaucoma  heart disease  high blood pressure  infection (especially a virus infection such as chickenpox, cold sores, or herpes)  liver disease  mental illness  myasthenia gravis  osteoporosis  recently received or scheduled to receive a vaccine  seizures  stomach or intestine problems  thyroid disease  an unusual or allergic reaction to lactose, methylprednisolone, other medicines, foods, dyes, or preservatives  pregnant or trying to get pregnant  breast-feeding How should I use this medicine? This medicine is for injection or infusion into a vein. It is also for injection into a muscle. It is given by a health care professional in a hospital or clinic setting. Talk to your pediatrician regarding the use of this medicine in children. While this drug may be prescribed for selected conditions, precautions do apply. Overdosage: If you think you have taken too much of this medicine contact a poison control center or emergency room at once. NOTE: This medicine is only for you. Do not share this medicine with others. What if I miss a dose? This does not apply. What may interact with this medicine? Do not take this medicine with any of the following  medications:  alefacept  echinacea  iopamidol  live virus vaccines  metyrapone  mifepristone This medicine may also interact with the following medications:  amphotericin B  aspirin and aspirin-like medicines  certain antibiotics like erythromycin, clarithromycin, troleandomycin  certain medicines for diabetes  certain medicines for fungal infection like ketoconazole  certain medicines for seizures like carbamazepine, phenobarbital, phenytoin  certain medicines that treat or prevent blood clots like warfarin  cyclosporine  digoxin  diuretics  male hormones, like estrogens and birth control pills  isoniazid  NSAIDS, medicines for pain and inflammation, like ibuprofen or naproxen  other medicines for myasthenia gravis  rifampin  vaccines This list may not describe all possible interactions. Give your health care provider a list of all the medicines, herbs, non-prescription drugs, or dietary supplements you use. Also tell them if you smoke, drink alcohol, or use illegal drugs. Some items may interact with your medicine. What should I watch for while using this medicine? Tell your doctor or healthcare professional if your symptoms do not start to get better or if they get worse. Do not stop taking except on your doctor's advice. You may develop a severe reaction. Your doctor will tell you how much medicine to take. Your condition will be monitored carefully while you are receiving this medicine. This medicine may increase your risk of getting an infection. Tell your doctor or health care professional if you are around anyone with measles or chickenpox, or if you develop sores or blisters that do not heal properly. This medicine may increase blood sugar. Ask your healthcare provider if changes in diet or medicines are needed if you have diabetes. Tell   your doctor or health care professional right away if you have any change in your eyesight. Using this medicine for a  long time may increase your risk of low bone mass. Talk to your doctor about bone health. What side effects may I notice from receiving this medicine? Side effects that you should report to your doctor or health care professional as soon as possible:  allergic reactions like skin rash, itching or hives, swelling of the face, lips, or tongue  bloody or tarry stools  hallucination, loss of contact with reality  muscle cramps  muscle pain  palpitations  signs and symptoms of high blood sugar such as being more thirsty or hungry or having to urinate more than normal. You may also feel very tired or have blurry vision.  signs and symptoms of infection like fever or chills; cough; sore throat; pain or trouble passing urine  trouble passing urine Side effects that usually do not require medical attention (report to your doctor or health care professional if they continue or are bothersome):  changes in emotions or mood  constipation  diarrhea  excessive hair growth on the face or body  headache  nausea, vomiting  pain, redness, or irritation at site where injected  trouble sleeping  weight gain This list may not describe all possible side effects. Call your doctor for medical advice about side effects. You may report side effects to FDA at 1-800-FDA-1088. Where should I keep my medicine? This drug is given in a hospital or clinic and will not be stored at home. NOTE: This sheet is a summary. It may not cover all possible information. If you have questions about this medicine, talk to your doctor, pharmacist, or health care provider.  2020 Elsevier/Gold Standard (2017-10-09 09:12:19)  

## 2018-10-29 ENCOUNTER — Encounter (HOSPITAL_COMMUNITY): Payer: PPO

## 2018-10-30 ENCOUNTER — Other Ambulatory Visit: Payer: Self-pay | Admitting: Neurology

## 2018-11-27 ENCOUNTER — Ambulatory Visit (HOSPITAL_COMMUNITY)
Admission: RE | Admit: 2018-11-27 | Discharge: 2018-11-27 | Disposition: A | Discharge status: Home / Self Care | Payer: PPO | Source: Ambulatory Visit | Attending: Neurology | Admitting: Neurology

## 2018-11-27 ENCOUNTER — Other Ambulatory Visit: Payer: Self-pay

## 2018-11-27 DIAGNOSIS — G6181 Chronic inflammatory demyelinating polyneuritis: Secondary | ICD-10-CM | POA: Insufficient documentation

## 2018-11-27 MED ORDER — SODIUM CHLORIDE 0.9 % IV SOLN
1000.0000 mg | INTRAVENOUS | Status: DC
  Administered 2018-11-27: 09:00:00 1000 mg via INTRAVENOUS
  Filled 2018-11-27: qty 8

## 2018-11-27 MED ORDER — SODIUM CHLORIDE 0.9 % IV SOLN
INTRAVENOUS | Status: DC | PRN
  Administered 2018-11-27: 250 mL via INTRAVENOUS

## 2018-11-27 NOTE — Progress Notes (Signed)
PATIENT CARE CENTER NOTE   Provider:  Patel, Donika, DO   Procedure: IV Solu-medrol    Note: Patient received Solu-medrol infusion. Tolerated well. Vital signs stable. Discharge instructions given. Patient to come back every 28 days for infusion. Alert, oriented and ambulatory at discharge.   

## 2018-11-27 NOTE — Discharge Instructions (Signed)
Methylprednisolone Solution for Injection What is this medicine? METHYLPREDNISOLONE (meth ill pred NISS oh lone) is a corticosteroid. It is commonly used to treat inflammation of the skin, joints, lungs, and other organs. Common conditions treated include asthma, allergies, and arthritis. It is also used for other conditions, such as blood disorders and diseases of the adrenal glands. This medicine may be used for other purposes; ask your health care provider or pharmacist if you have questions. COMMON BRAND NAME(S): A-Methapred, Solu-Medrol What should I tell my health care provider before I take this medicine? They need to know if you have any of these conditions:  Cushing's syndrome  eye disease, vision problems  diabetes  glaucoma  heart disease  high blood pressure  infection (especially a virus infection such as chickenpox, cold sores, or herpes)  liver disease  mental illness  myasthenia gravis  osteoporosis  recently received or scheduled to receive a vaccine  seizures  stomach or intestine problems  thyroid disease  an unusual or allergic reaction to lactose, methylprednisolone, other medicines, foods, dyes, or preservatives  pregnant or trying to get pregnant  breast-feeding How should I use this medicine? This medicine is for injection or infusion into a vein. It is also for injection into a muscle. It is given by a health care professional in a hospital or clinic setting. Talk to your pediatrician regarding the use of this medicine in children. While this drug may be prescribed for selected conditions, precautions do apply. Overdosage: If you think you have taken too much of this medicine contact a poison control center or emergency room at once. NOTE: This medicine is only for you. Do not share this medicine with others. What if I miss a dose? This does not apply. What may interact with this medicine? Do not take this medicine with any of the following  medications:  alefacept  echinacea  iopamidol  live virus vaccines  metyrapone  mifepristone This medicine may also interact with the following medications:  amphotericin B  aspirin and aspirin-like medicines  certain antibiotics like erythromycin, clarithromycin, troleandomycin  certain medicines for diabetes  certain medicines for fungal infection like ketoconazole  certain medicines for seizures like carbamazepine, phenobarbital, phenytoin  certain medicines that treat or prevent blood clots like warfarin  cyclosporine  digoxin  diuretics  male hormones, like estrogens and birth control pills  isoniazid  NSAIDS, medicines for pain and inflammation, like ibuprofen or naproxen  other medicines for myasthenia gravis  rifampin  vaccines This list may not describe all possible interactions. Give your health care provider a list of all the medicines, herbs, non-prescription drugs, or dietary supplements you use. Also tell them if you smoke, drink alcohol, or use illegal drugs. Some items may interact with your medicine. What should I watch for while using this medicine? Tell your doctor or healthcare professional if your symptoms do not start to get better or if they get worse. Do not stop taking except on your doctor's advice. You may develop a severe reaction. Your doctor will tell you how much medicine to take. Your condition will be monitored carefully while you are receiving this medicine. This medicine may increase your risk of getting an infection. Tell your doctor or health care professional if you are around anyone with measles or chickenpox, or if you develop sores or blisters that do not heal properly. This medicine may increase blood sugar. Ask your healthcare provider if changes in diet or medicines are needed if you have diabetes. Tell   your doctor or health care professional right away if you have any change in your eyesight. Using this medicine for a  long time may increase your risk of low bone mass. Talk to your doctor about bone health. What side effects may I notice from receiving this medicine? Side effects that you should report to your doctor or health care professional as soon as possible:  allergic reactions like skin rash, itching or hives, swelling of the face, lips, or tongue  bloody or tarry stools  hallucination, loss of contact with reality  muscle cramps  muscle pain  palpitations  signs and symptoms of high blood sugar such as being more thirsty or hungry or having to urinate more than normal. You may also feel very tired or have blurry vision.  signs and symptoms of infection like fever or chills; cough; sore throat; pain or trouble passing urine  trouble passing urine Side effects that usually do not require medical attention (report to your doctor or health care professional if they continue or are bothersome):  changes in emotions or mood  constipation  diarrhea  excessive hair growth on the face or body  headache  nausea, vomiting  pain, redness, or irritation at site where injected  trouble sleeping  weight gain This list may not describe all possible side effects. Call your doctor for medical advice about side effects. You may report side effects to FDA at 1-800-FDA-1088. Where should I keep my medicine? This drug is given in a hospital or clinic and will not be stored at home. NOTE: This sheet is a summary. It may not cover all possible information. If you have questions about this medicine, talk to your doctor, pharmacist, or health care provider.  2020 Elsevier/Gold Standard (2017-10-09 09:12:19)  

## 2018-12-03 NOTE — Progress Notes (Signed)
Follow-up Visit   Date: 12/04/18    Jeffrey Lin MRN: [id number] DOB: [birth year]   Interim History: Jeffrey Lin is a 43 y.o. right-handed Caucasian male with depression, hypertension, schizoaffective disorder, tobacco use, prediabetes, and anxiety returning to the clinic for follow-up of CIDP.  The patient was accompanied to the clinic by self.  History of present illness: Starting around 2014, he began noticing sharp pain in the heel as well as firey cold sensation of the toes.  He also feels that there are compression stocking over his lower legs.  He takes gabapentin 1261m three times daily which provides about 50% relief in pain. He has some weakness of the left foot.  He has chronic low back pain.  He was seeing Dr. WJacqualyn Posey podiatry, who referred him for electrodiagnostic testing of the legs.  These findings show chronic sensorimotor polyneuropathy, axon loss and demyelinating. Recent labs show normal vitamin B12, TSH, and low folate.  He is not taking supplements for this.   He smokes 1 ppd for about 20 years.  He was previously drinking 6 pack over the weekend, but has quit this since 2014.  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   He has noticed improved low back pain when he gets steroids and improved left leg movement.  He continues to have burning paresthesias over the feet and recently began having severe pain in the mid-back region.     UPDATE 12/03/2018:  He is here for follow-up visit.  Despite restarted steroids, he has not noticed any change in the severity of the paresthesias in the legs.  He takes gabapentin 6032m2 tablets three times daily, which is prescribed by his psychiatrist. I have advised him  to take a lower dose for his neuropathy. He also takes cymbalta 3058maily.  He denies any new weakness or falls.    Medications:  Current Outpatient Medications on File Prior to Visit  Medication Sig Dispense Refill  . ALPRAZolam (XANAX) 1 MG tablet TAKE 1 TABLET 4 TIMES A DAY AS NEEDED *TBF 9/28, 10/28, 11/27, 12/27, 1/26, 2/25*STOP VALIUM*    . amantadine (SYMMETREL) 100 MG capsule Take 100 mg by mouth 2 (two) times daily.    . aMarland Kitchenphetamine-dextroamphetamine (ADDERALL) 20 MG tablet Take 1 tablet by mouth 2 (two) times daily.    . aMarland Kitchenorvastatin (LIPITOR) 40 MG tablet TAKE 1 TABLET BY MOUTH EVERY DAY 90 tablet 3  . diazepam (VALIUM) 10 MG tablet Take 10 mg by mouth 4 (four) times daily.    . fluticasone (FLONASE) 50 MCG/ACT nasal spray Place 2 sprays into both nostrils daily. (Patient taking differently: Place 2 sprays into both nostrils daily. As needed) 1 g 0  . furosemide (LASIX) 20 MG tablet Take 2 tablets (40 mg total) by mouth daily. 180 tablet 1  . gabapentin (NEURONTIN) 600 MG tablet Take 600 mg by mouth 4 (four) times daily. Patient is taking 2 tablets TID    . losartan (COZAAR) 100 MG tablet TAKE 1 TABLET (100 MG TOTAL) BY MOUTH DAILY. PLEASE SCHEDULE PHYSICAL (Patient taking differently: Take 100 mg by mouth daily. As needed) 90 tablet 0  . Metoprolol Succinate 50 MG CS24 Take by mouth daily.    .Marland Kitchen  omeprazole (PRILOSEC) 20 MG capsule Take 20 mg by mouth daily as needed.    . potassium chloride (K-DUR) 10 MEQ tablet TAKE 1 TABLET BY MOUTH EVERY DAY 90 tablet 1  . propranolol (INDERAL) 20 MG tablet Take 20 mg by mouth 4 (four) times daily.    . risperiDONE (RISPERDAL) 1 MG tablet Take 1 tablet by mouth at bedtime.    . risperiDONE (RISPERDAL) 2 MG tablet Take 2 mg by mouth at bedtime.    . tizanidine (ZANAFLEX) 2 MG capsule Take 1 capsule (2 mg total) by mouth 3 (three) times daily. 90 capsule 0  . Vitamin D, Ergocalciferol, (DRISDOL) 50000 units CAPS capsule Take 1 capsule (50,000 Units  total) by mouth 2 (two) times a week.     No current facility-administered medications on file prior to visit.     Allergies:  Allergies  Allergen Reactions  . Toradol [Ketorolac Tromethamine] Swelling  . Lamotrigine Rash    Review of Systems:  CONSTITUTIONAL: No fevers, chills, night sweats, or weight loss.  EYES: No visual changes or eye pain ENT: No hearing changes.  No history of nose bleeds.   RESPIRATORY: No cough, wheezing and shortness of breath.   CARDIOVASCULAR: Negative for chest pain, and palpitations.   GI: Negative for abdominal discomfort, blood in stools or black stools.  No recent change in bowel habits.   GU:  No history of incontinence.   MUSCLOSKELETAL: No history of joint pain or swelling.  No myalgias.   SKIN: Negative for lesions, rash, and itching.   ENDOCRINE: Negative for cold or heat intolerance, polydipsia or goiter.   PSYCH:  + depression +anxiety symptoms.   NEURO: As Above.   Vital Signs:  BP 131/85   Pulse (!) 103   Ht _0  (1.981 m)   Wt 267 lb (121.1 kg)   SpO2 97%   BMI 30.85 kg/m   General Medical Exam:   General:  Appears comfortable, heavily tatooed Eyes/ENT: see cranial nerve examination.   Neck: Full range of motion without tenderness. No carotid bruits. Respiratory:  Clear to auscultation, good air entry bilaterally.   Cardiac:  Regular rate and rhythm, no murmur.   Ext:  No edema   Neurological Exam: MENTAL STATUS including orientation to time, place, person, recent and remote memory, attention span and concentration, language, and fund of knowledge is normal.  Speech is not dysarthric. Poor eye contact.  CRANIAL NERVES: Pupils equal round and reactive to light.  Normal conjugate, extra-ocular eye movements in all directions of gaze.  No ptosis.   MOTOR:  Motor strength is 5/5 in all extremities, including distally in the the left foot.  No atrophy, fasciculations or abnormal movements.  No pronator drift.  Tone is normal.     MSRs:  Reflexes are 2+/4 throughout, except 1+/4 in the left ankle  SENSORY:  Vibration is trace at the great toe, intact at ankles.   COORDINATION/GAIT:  Gait is mildly wide-based, stable, unassisted  Data: NCS/EMG of the legs 02/06/2017:   The electrophysiologic findings are most consistent with a chronic sensorimotor polyneuropathy, axon loss and demyelinating in type, affecting the lower extremities.   Labs 03/17/2017:  HbA1c 6.0, vitamin B12 312, folate 2.8*, TSH 1.50, vitamin D 93 Labs 05/07/2017:  Heavy metal screen neg, SPEP with IFE no M protein, copper 107, vitamin B1 <6*, MMA 325*, CRP 0.4, ESR 13 CSF 05/14/2017:  R0 W1 P71* G69  IgG index 0.6, no OCB, ACE neg, Lyme neg, cytology  neg  CT head 09/09/2013:  Neg  MRI thoracic spine wo contrast 06/14/2018:  Normal. MRI lumbar spine wo contrast 06/03/2018:  1. Mild lumbar degenerative change. 2. Small central disc protrusion L4-5 with mild subarticular stenosis bilaterally and mild left foraminal stenosis. 3. 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  IMPRESSION/PLAN 1.  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  IVMP was started in July 2019 - February 2020.  Starting in August 2020, steroids were restarted due to paresthesias, but there has been no change in symptoms. Discontinue future steroid treatments Continue to manage paresthesias symptomatically.  Increase Cymbalta to 27m daily Adjust gabapentin to 120104mBID.  This is also managed by his psychiatrist who is writing the medication  2.  Vitamin B-12, vitamin B1, and folate deficiency.  Check levels.  Continue taking vitamin B12 100023mdaily, B1 100114YWily, and folic acid 1mg102mily.  Return to clinic in 6 months  Greater than 50% of this 25 minute  visit was spent in counseling, explanation of diagnosis, planning of further management, and coordination of care.   Thank you for allowing me to participate in patient's care.  If I can answer any additional questions, I would be pleased to do so.    Sincerely,    Rayquan Amrhein K. PatePosey Pronto

## 2018-12-04 ENCOUNTER — Encounter: Payer: Self-pay | Admitting: Neurology

## 2018-12-04 ENCOUNTER — Other Ambulatory Visit (INDEPENDENT_AMBULATORY_CARE_PROVIDER_SITE_OTHER): Payer: PPO

## 2018-12-04 ENCOUNTER — Ambulatory Visit (INDEPENDENT_AMBULATORY_CARE_PROVIDER_SITE_OTHER): Payer: PPO | Admitting: Neurology

## 2018-12-04 ENCOUNTER — Telehealth: Payer: Self-pay

## 2018-12-04 ENCOUNTER — Other Ambulatory Visit: Payer: Self-pay

## 2018-12-04 VITALS — BP 131/85 | HR 103 | Ht 78.0 in | Wt 267.0 lb

## 2018-12-04 DIAGNOSIS — E538 Deficiency of other specified B group vitamins: Secondary | ICD-10-CM

## 2018-12-04 DIAGNOSIS — G6181 Chronic inflammatory demyelinating polyneuritis: Secondary | ICD-10-CM

## 2018-12-04 DIAGNOSIS — E519 Thiamine deficiency, unspecified: Secondary | ICD-10-CM

## 2018-12-04 MED ORDER — DULOXETINE HCL 60 MG PO CPEP: 60.0000 mg | ORAL_CAPSULE | Freq: Every day | ORAL | 1 refills | Status: DC

## 2018-12-04 NOTE — Telephone Encounter (Signed)
I contacted patient care to cancel any updated appt for steriod injections.

## 2018-12-04 NOTE — Patient Instructions (Addendum)
Increase Cymbalta to 60mg  daily  Adjust gabapentin to 1200mg  twice daily  Discontinue steroid infusions  Check folate and vitamin B1 levels  Your provider has requested that you have labwork completed today. Please go to [facility] ([id number]) on the second floor of this building before leaving the office today. You do not need to check in. If you are not called within 15 minutes please check with the front desk.  Return to clinic in 6 months

## 2018-12-07 ENCOUNTER — Telehealth: Payer: Self-pay

## 2018-12-07 LAB — FOLATE: Folate: 4.8 ng/mL — ABNORMAL LOW

## 2018-12-07 LAB — VITAMIN B1: Vitamin B1 (Thiamine): <6 nmol/L — ABNORMAL LOW (ref 8–30)

## 2018-12-07 NOTE — Telephone Encounter (Signed)
Unable to contact patient to inform him of results and recommendations.

## 2018-12-07 NOTE — Progress Notes (Signed)
Attempted to reach patient.  Unable to leave message.  Will attempt again.

## 2018-12-11 ENCOUNTER — Encounter: Payer: Self-pay | Admitting: Family Medicine

## 2018-12-23 ENCOUNTER — Telehealth: Payer: Self-pay

## 2018-12-23 NOTE — Telephone Encounter (Signed)
Appointment schedule to discuss medications and concerns.

## 2018-12-23 NOTE — Telephone Encounter (Signed)
Please ask patient to make follow-up visit to discuss options.

## 2018-12-23 NOTE — Telephone Encounter (Signed)
Patient wanted to report that the 60mg  cymbalta has not seemed to make a difference.  Patient still experiencing tingling in his face and feet.  He feels like he is sweating but hes not.

## 2018-12-23 NOTE — Telephone Encounter (Signed)
Spoke with patient and advised him to increase his supplement.  He is understandable.

## 2018-12-28 ENCOUNTER — Encounter: Payer: Self-pay | Admitting: Family Medicine

## 2018-12-28 ENCOUNTER — Ambulatory Visit (INDEPENDENT_AMBULATORY_CARE_PROVIDER_SITE_OTHER): Payer: PPO | Admitting: Family Medicine

## 2018-12-28 ENCOUNTER — Other Ambulatory Visit: Payer: Self-pay

## 2018-12-28 ENCOUNTER — Telehealth: Payer: Self-pay

## 2018-12-28 VITALS — BP 132/86 | HR 99 | Temp 97.8°F | Ht 76.5 in | Wt 279.2 lb

## 2018-12-28 DIAGNOSIS — R609 Edema, unspecified: Secondary | ICD-10-CM

## 2018-12-28 DIAGNOSIS — E519 Thiamine deficiency, unspecified: Secondary | ICD-10-CM | POA: Diagnosis not present

## 2018-12-28 DIAGNOSIS — F17200 Nicotine dependence, unspecified, uncomplicated: Secondary | ICD-10-CM

## 2018-12-28 DIAGNOSIS — Z86718 Personal history of other venous thrombosis and embolism: Secondary | ICD-10-CM

## 2018-12-28 DIAGNOSIS — F259 Schizoaffective disorder, unspecified: Secondary | ICD-10-CM

## 2018-12-28 DIAGNOSIS — E538 Deficiency of other specified B group vitamins: Secondary | ICD-10-CM

## 2018-12-28 DIAGNOSIS — I10 Essential (primary) hypertension: Secondary | ICD-10-CM | POA: Diagnosis not present

## 2018-12-28 LAB — POC URINALSYSI DIPSTICK (AUTOMATED)
Bilirubin, UA: NEGATIVE
Glucose, UA: NEGATIVE
Ketones, UA: NEGATIVE
Leukocytes, UA: NEGATIVE
Nitrite, UA: NEGATIVE
Protein, UA: NEGATIVE
Spec Grav, UA: 1.015 (ref 1.010–1.025)
Urobilinogen, UA: 0.2 E.U./dL
pH, UA: 7.5 (ref 5.0–8.0)

## 2018-12-28 MED ORDER — FOLIC ACID 1 MG PO TABS: 2.0000 mg | ORAL_TABLET | Freq: Every day | ORAL | Status: DC

## 2018-12-28 MED ORDER — THIAMINE HCL 100 MG PO TABS: 200.0000 mg | ORAL_TABLET | Freq: Every day | ORAL | Status: DC

## 2018-12-28 MED ORDER — TIZANIDINE HCL 2 MG PO CAPS: 2.0000 mg | ORAL_CAPSULE | Freq: Three times a day (TID) | ORAL | 0 refills | Status: DC | PRN

## 2018-12-28 NOTE — Telephone Encounter (Signed)
Will see then. 

## 2018-12-28 NOTE — Patient Instructions (Signed)
labwork today, urine check today.  I don't think this is from infection or blood clot.  Continue elevating legs Avoid salt in diet, increase water intake.  We will be in touch with results

## 2018-12-28 NOTE — Assessment & Plan Note (Signed)
Endorses compliance with 200mg  thiamine (B1) OTC daily.

## 2018-12-28 NOTE — Assessment & Plan Note (Signed)
Continued smoker. Continue to encourage cessation.

## 2018-12-28 NOTE — Assessment & Plan Note (Signed)
Endorses compliance with folate 2mg  daily.

## 2018-12-28 NOTE — Progress Notes (Addendum)
This visit was conducted in person.  BP 132/86 (BP Location: Right Arm, Patient Position: Sitting, Cuff Size: Large)   Pulse 99   Temp 97.8 F (36.6 C) (Temporal)   Ht 6' 4.5" (1.943 m)   Wt 279 lb 4 oz (126.7 kg)   SpO2 94%   BMI 33.55 kg/m    CC: B lower leg swelling/redness Subjective:    Patient ID: Jeffrey Lin, male    DOB: 08/16/1975, 43 y.o.   MRN: [id number]  HPI: Jeffrey GillisBobby G Daigneault is a 43 y.o. male presenting on 12/28/2018 for Leg Swelling (C/o bilateral low leg swelling and redness.  Also, c/o bilateral cold feet.  Started 12/25/18 in in ankles. )   3d h/o R>L ankle pain and swelling. Did improve after he slept overnight with legs elevated. Initial redness, not anymore. Avoids NSAIDs.   No increased salt in diet - no canned soups, recent pork/ham, occasional frozen dinners.  Denies significant alcohol intake.  Denies dizziness, HA, chest pain, dyspnea, fevers/chills. Inciting trauma.   He is on lasix 40mg  daily for ankle swelling.   Ongoing paresthesias of legs and cold sensation/burning of feet. He is taking gabapentin for this. cymbalta recently increased to 60mg  a few weeks ago - he decreased back to 30mg . Dr Allena KatzPatel is managing periph neuropathy.   Current smoker <1ppd.  Med list reviewed - not taking metoprolol, only uses losartan PRN.      Relevant past medical, surgical, family and social history reviewed and updated as indicated. Interim medical history since our last visit reviewed. Allergies and medications reviewed and updated. Outpatient Medications Prior to Visit  Medication Sig Dispense Refill  . ALPRAZolam (XANAX) 1 MG tablet TAKE 1 TABLET 4 TIMES A DAY AS NEEDED *TBF 9/28, 10/28, 11/27, 12/27, 1/26, 2/25*STOP VALIUM*    . amantadine (SYMMETREL) 100 MG capsule Take 100 mg by mouth 2 (two) times daily.    Marland Kitchen. amphetamine-dextroamphetamine (ADDERALL) 20 MG tablet Take 1 tablet by mouth 2 (two) times daily.    Marland Kitchen. atorvastatin (LIPITOR) 40 MG tablet TAKE 1  TABLET BY MOUTH EVERY DAY 90 tablet 3  . diazepam (VALIUM) 10 MG tablet Take 10 mg by mouth 4 (four) times daily.    . DULoxetine (CYMBALTA) 60 MG capsule Take 1 capsule (60 mg total) by mouth daily. 90 capsule 1  . fluticasone (FLONASE) 50 MCG/ACT nasal spray Place 2 sprays into both nostrils daily. (Patient taking differently: Place 2 sprays into both nostrils daily. As needed) 1 g 0  . furosemide (LASIX) 20 MG tablet Take 2 tablets (40 mg total) by mouth daily. 180 tablet 1  . gabapentin (NEURONTIN) 600 MG tablet Take 600 mg by mouth 4 (four) times daily. Patient is taking 2 tablets TID    . losartan (COZAAR) 100 MG tablet TAKE 1 TABLET (100 MG TOTAL) BY MOUTH DAILY. PLEASE SCHEDULE PHYSICAL (Patient taking differently: Take 100 mg by mouth daily. As needed) 90 tablet 0  . Metoprolol Succinate 50 MG CS24 Take by mouth daily.    Marland Kitchen. omeprazole (PRILOSEC) 20 MG capsule Take 20 mg by mouth daily as needed.    . potassium chloride (K-DUR) 10 MEQ tablet TAKE 1 TABLET BY MOUTH EVERY DAY 90 tablet 1  . propranolol (INDERAL) 20 MG tablet Take 20 mg by mouth 4 (four) times daily.    . risperiDONE (RISPERDAL) 2 MG tablet Take 2 mg by mouth at bedtime.    . Vitamin D, Ergocalciferol, (DRISDOL) 50000 units CAPS capsule  Take 1 capsule (50,000 Units total) by mouth 2 (two) times a week.    . risperiDONE (RISPERDAL) 1 MG tablet Take 1 tablet by mouth at bedtime.    . tizanidine (ZANAFLEX) 2 MG capsule Take 1 capsule (2 mg total) by mouth 3 (three) times daily. 90 capsule 0   No facility-administered medications prior to visit.      Per HPI unless specifically indicated in ROS section below Review of Systems Objective:    BP 132/86 (BP Location: Right Arm, Patient Position: Sitting, Cuff Size: Large)   Pulse 99   Temp 97.8 F (36.6 C) (Temporal)   Ht 6' 4.5" (1.943 m)   Wt 279 lb 4 oz (126.7 kg)   SpO2 94%   BMI 33.55 kg/m   Wt Readings from Last 3 Encounters:  12/28/18 279 lb 4 oz (126.7 kg)   12/04/18 267 lb (121.1 kg)  09/01/18 270 lb (122.5 kg)    Physical Exam Vitals signs and nursing note reviewed.  Constitutional:      Appearance: Normal appearance. He is obese. He is not ill-appearing.  Eyes:     Extraocular Movements: Extraocular movements intact.     Pupils: Pupils are equal, round, and reactive to light.  Cardiovascular:     Rate and Rhythm: Normal rate and regular rhythm.     Pulses: Normal pulses.     Heart sounds: No murmur.  Pulmonary:     Effort: Pulmonary effort is normal. No respiratory distress.     Breath sounds: Normal breath sounds. No wheezing, rhonchi or rales.  Abdominal:     General: Abdomen is flat. There is distension.     Palpations: Abdomen is soft. There is no mass.     Tenderness: There is no abdominal tenderness. There is no guarding or rebound.     Hernia: No hernia is present.  Musculoskeletal:        General: No tenderness.     Right lower leg: Edema (1+ to knee) present.     Left lower leg: Edema (1+ to knee) present.  Skin:    General: Skin is warm and dry.     Findings: No erythema or rash.  Neurological:     Mental Status: He is alert.  Psychiatric:        Mood and Affect: Mood normal.        Behavior: Behavior normal.       Results for orders placed or performed in visit on 12/28/18  POCT Urinalysis Dipstick (Automated)  Result Value Ref Range   Color, UA light yellow    Clarity, UA clear    Glucose, UA Negative Negative   Bilirubin, UA negative    Ketones, UA negative    Spec Grav, UA 1.015 1.010 - 1.025   Blood, UA +/-    pH, UA 7.5 5.0 - 8.0   Protein, UA Negative Negative   Urobilinogen, UA 0.2 0.2 or 1.0 E.U./dL   Nitrite, UA negative    Leukocytes, UA Negative Negative   Lab Results  Component Value Date   CREATININE 1.02 08/05/2018   BUN 6 08/05/2018   NA 142 08/05/2018   K 3.8 08/05/2018   CL 93 (L) 08/05/2018   CO2 41 (H) 08/05/2018    Lab Results  Component Value Date   TSH 0.568 03/28/2018     Lab Results  Component Value Date   WBC 10.4 04/04/2018   HGB 14.5 04/04/2018   HCT 44.2 04/04/2018   MCV 84.4  04/04/2018   PLT 277 04/04/2018    Assessment & Plan:  This visit occurred during the SARS-CoV-2 public health emergency.  Safety protocols were in place, including screening questions prior to the visit, additional usage of staff PPE, and extensive cleaning of exam room while observing appropriate contact time as indicated for disinfecting solutions.   Problem List Items Addressed This Visit    TOBACCO ABUSE    Continued smoker. Continue to encourage cessation.       Thiamine deficiency    Endorses compliance with 200mg  thiamine (B1) OTC daily.      Schizoaffective disorder ([place])   Peripheral edema - Primary    Unclear cause. Not consistent with DVT or cellulitis. Encouraged caution with dietary sodium intake, rec increase water intake. He also continues lasix 40mg  daily (he states previously started for pedal edema). Check labwork including urinalysis - will be in touch with results. Will notify pt with lab work results.       Relevant Orders   TSH   CBC with Differential   Comprehensive metabolic panel   Sedimentation rate   POCT Urinalysis Dipstick (Automated) (Completed)   D-dimer, quantitative (not at [facility])   History of DVT (deep vein thrombosis)    Will monitor closely. Add D dimer.       Relevant Orders   D-dimer, quantitative (not at [facility])   Folate deficiency    Endorses compliance with folate 2mg  daily.      Essential hypertension, benign    Seems he's only taking propranolol and lasix, states doesn't think he's taking losartan or metoprolol - will verify when he gets home.           Meds ordered this encounter  Medications  . tizanidine (ZANAFLEX) 2 MG capsule    Sig: Take 1 capsule (2 mg total) by mouth 3 (three) times daily as needed for muscle spasms.    Dispense:  90 capsule    Refill:  0  . folic acid (FOLVITE) 1 MG tablet    Sig:  Take 2 tablets (2 mg total) by mouth daily.  Marland Kitchen thiamine 100 MG tablet    Sig: Take 2 tablets (200 mg total) by mouth daily.   Orders Placed This Encounter  Procedures  . TSH  . CBC with Differential  . Comprehensive metabolic panel  . Sedimentation rate  . D-dimer, quantitative (not at [facility])  . POCT Urinalysis Dipstick (Automated)    Patient Instructions  labwork today, urine check today.  I don't think this is from infection or blood clot.  Continue elevating legs Avoid salt in diet, increase water intake.  We will be in touch with results    Follow up plan: Return if symptoms worsen or fail to improve.  Ria Bush, MD

## 2018-12-28 NOTE — Telephone Encounter (Signed)
Pt left v/m that both legs from the knees down are swollen since 12/25/18. Last night the swelling in lower legs went down small amt while pt had legs up overnight. Pt had medicare wellness exam on 08/11/18. Pt denies pain or warmth in lower legs. Pt does not see any red streaks on either leg. Pt left lower leg is red from above ankle to mid calf. Pt right lower leg is red from above ankle to mid calf also. Pt has no covid symptoms, no travel and no known exposure to + covid. Pt does not have CP,SOB,H/A or dizziness. Pt scheduled in office visit with Dr Darnell Level 12/28/18 at 2;45pm. UC & ED precautions given to pt and pt voiced understanding.

## 2018-12-28 NOTE — Assessment & Plan Note (Signed)
Seems he's only taking propranolol and lasix, states doesn't think he's taking losartan or metoprolol - will verify when he gets home.

## 2018-12-28 NOTE — Assessment & Plan Note (Signed)
Will monitor closely. Add D dimer.

## 2018-12-28 NOTE — Assessment & Plan Note (Signed)
Unclear cause. Not consistent with DVT or cellulitis. Encouraged caution with dietary sodium intake, rec increase water intake. He also continues lasix 40mg  daily (he states previously started for pedal edema). Check labwork including urinalysis - will be in touch with results. Will notify pt with lab work results.

## 2018-12-29 ENCOUNTER — Telehealth: Payer: Self-pay | Admitting: Family Medicine

## 2018-12-29 LAB — D-DIMER, QUANTITATIVE: D-Dimer, Quant: 0.48 mcg/mL FEU (ref <0.50)

## 2018-12-29 LAB — COMPREHENSIVE METABOLIC PANEL
ALT: 25 U/L (ref 0–53)
AST: 18 U/L (ref 0–37)
Albumin: 4 g/dL (ref 3.5–5.2)
Alkaline Phosphatase: 122 U/L — ABNORMAL HIGH (ref 39–117)
BUN: 4 mg/dL — ABNORMAL LOW (ref 6–23)
CO2: 39 mEq/L — ABNORMAL HIGH (ref 19–32)
Calcium: 9.1 mg/dL (ref 8.4–10.5)
Chloride: 95 mEq/L — ABNORMAL LOW (ref 96–112)
Creatinine, Ser: 0.94 mg/dL (ref 0.40–1.50)
GFR: 87.76 mL/min (ref >60.00)
Glucose, Bld: 87 mg/dL (ref 70–99)
Potassium: 4 mEq/L (ref 3.5–5.1)
Sodium: 140 mEq/L (ref 135–145)
Total Bilirubin: 0.4 mg/dL (ref 0.2–1.2)
Total Protein: 6.3 g/dL (ref 6.0–8.3)

## 2018-12-29 LAB — CBC WITH DIFFERENTIAL/PLATELET
Basophils Absolute: 0 10*3/uL (ref 0.0–0.1)
Basophils Relative: 0.6 % (ref 0.0–3.0)
Eosinophils Absolute: 0.2 10*3/uL (ref 0.0–0.7)
Eosinophils Relative: 3.4 % (ref 0.0–5.0)
HCT: 44.9 % (ref 39.0–52.0)
Hemoglobin: 15.5 g/dL (ref 13.0–17.0)
Lymphocytes Relative: 34.3 % (ref 12.0–46.0)
Lymphs Abs: 2.2 10*3/uL (ref 0.7–4.0)
MCHC: 34.4 g/dL (ref 30.0–36.0)
MCV: 93.2 fl (ref 78.0–100.0)
Monocytes Absolute: 0.6 10*3/uL (ref 0.1–1.0)
Monocytes Relative: 8.6 % (ref 3.0–12.0)
Neutro Abs: 3.5 10*3/uL (ref 1.4–7.7)
Neutrophils Relative %: 53.1 % (ref 43.0–77.0)
Platelets: 225 10*3/uL (ref 150.0–400.0)
RBC: 4.82 Mil/uL (ref 4.22–5.81)
RDW: 14.1 % (ref 11.5–15.5)
WBC: 6.5 10*3/uL (ref 4.0–10.5)

## 2018-12-29 LAB — TSH: TSH: 0.84 u[IU]/mL (ref 0.35–4.50)

## 2018-12-29 LAB — SEDIMENTATION RATE: Sed Rate: 22 mm/hr — ABNORMAL HIGH (ref 0–15)

## 2018-12-29 NOTE — Telephone Encounter (Signed)
Pt dropped of long term disability form to be filled out  In dr Diona Browner in box

## 2018-12-31 NOTE — Telephone Encounter (Signed)
In outbox

## 2019-01-04 NOTE — Telephone Encounter (Signed)
Paperwork faxed °Pt aware °Copy for pt °Copy for scan °Copy for billing °

## 2019-01-07 NOTE — Progress Notes (Signed)
Virtual Visit via Telephone Note The purpose of this virtual visit is to provide medical care while limiting exposure to the novel coronavirus.    Consent was obtained for telephone visit:  Yes.   Answered questions that patient had about telehealth interaction:  Yes.   I discussed the limitations, risks, security and privacy concerns of performing an evaluation and management service by telemedicine. I also discussed with the patient that there may be a patient responsible charge related to this service. The patient expressed understanding and agreed to proceed.  Pt location: Home Physician Location: office Name of referring provider:  Jinny Sanders, MD I connected with Jeffrey Lin at patients initiation/request on 01/08/2019 at  8:30 AM EST by video enabled telemedicine application and verified that I am speaking with the correct person using two identifiers. Pt MRN:  [id number] Pt DOB:  Oct 29, 1975 Video Participants:  Jeffrey Lin   History of Present Illness: This is a 43 y.o. male with history of schizoaffective disorder, depression, hypertension, prediabetes, and tobacco use returning for follow-up of [facility]. He complains of sensation that his feet are numbness and wet, as if his socks and pants are heavy and wet on his legs.  He does not have any worsening pain, such as burning, tingling, or stinging.  He takes gabapentin 600mg  (2) tablet in the morning, (1) in the afternoon, and (1) at bedtime which controls his tingling pain.  He does not feel that Cymbalta is helping these symptoms.  He also complains of bilateral leg and ankle swelling and saw his PCP for this last week.  He denies any weakness of the feet.    Observations/Objective:   Vitals:   01/08/19 0821  Weight: 270 lb (122.5 kg)  Height: 6' 4.5" (1.943 m)  Patient is awake answers questions appropriately.   Assessment and Plan:  1.  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  He completed IVMP from July 2019 - February 2020 and had improved left foot strength and return of reflexes.  In August 2020, steroids were restarted due to increased paresthesias and stopped again as there was no benefit.  Long discussion with patient addressing the role of medications such as gabapentin and Cymbalta, such that it will only help with painful paresthesias and there are no medications to help with negative paresthesias (numbness, heavy, wet sensation).  Therefore, I will stop his Cymbalta and keep him on currently regimen of gabapentin 1200mg  in the morning, 600mg  in the afternoon, and 600mg  at bedtime. Patient informed that gabapentin can cause swelling of the legs and we can consider reducing the dose, but he reports being on gabapentin for many years and never having this problem, so prefers to stay on it.   2.  Vitamin B12, vitamin B1, and folate deficiency.   Increase folic acid to 2mg  and thiamine to 200mg  daily due to low levels despite compliance.  Continue vitamin B12 1017mcg daily.  Check levels at next visit.    Follow Up Instructions:   I discussed the assessment and treatment plan with the patient. The patient was provided an opportunity to ask questions and all were answered. The patient agreed with the plan and demonstrated an understanding of the instructions.   The patient was advised to call back or seek an in-person evaluation if the symptoms worsen or if the condition fails to improve as anticipated.  Return to clinic in 3 months  Time spent:  12 min  Alda Berthold, DO

## 2019-01-08 ENCOUNTER — Encounter: Payer: Self-pay | Admitting: Neurology

## 2019-01-08 ENCOUNTER — Telehealth (INDEPENDENT_AMBULATORY_CARE_PROVIDER_SITE_OTHER): Payer: PPO | Admitting: Neurology

## 2019-01-08 ENCOUNTER — Other Ambulatory Visit: Payer: Self-pay

## 2019-01-08 VITALS — Ht 76.5 in | Wt 270.0 lb

## 2019-01-08 DIAGNOSIS — E538 Deficiency of other specified B group vitamins: Secondary | ICD-10-CM | POA: Diagnosis not present

## 2019-01-08 DIAGNOSIS — G6181 Chronic inflammatory demyelinating polyneuritis: Secondary | ICD-10-CM

## 2019-01-08 DIAGNOSIS — E519 Thiamine deficiency, unspecified: Secondary | ICD-10-CM | POA: Diagnosis not present

## 2019-01-24 ENCOUNTER — Other Ambulatory Visit: Payer: Self-pay | Admitting: Family Medicine

## 2019-01-25 NOTE — Telephone Encounter (Signed)
Last filled on 12/28/2018 #90 with 0 refill. LOV 12/28/2018

## 2019-02-05 ENCOUNTER — Other Ambulatory Visit: Payer: Self-pay | Admitting: Neurology

## 2019-02-19 ENCOUNTER — Other Ambulatory Visit: Payer: Self-pay | Admitting: Family Medicine

## 2019-02-19 NOTE — Telephone Encounter (Signed)
Last office visit 12/28/2018 with Dr. Reece Agar for peripheral edema.  Last refilled 01/25/2019 for #90 with no refills.  No future appointments with PCP.

## 2019-03-02 ENCOUNTER — Other Ambulatory Visit: Payer: Self-pay | Admitting: Family Medicine

## 2019-03-02 NOTE — Telephone Encounter (Signed)
Last office visit 12/28/2018 with Dr. Reece Agar for peripheral edema.  Last refilled 02/19/2019 for #90 with no refills. No future appointments.  Pharmacy is asking for a 90 day supply.  Ok to fill for #270?

## 2019-03-18 ENCOUNTER — Other Ambulatory Visit: Payer: Self-pay | Admitting: Family Medicine

## 2019-04-13 ENCOUNTER — Other Ambulatory Visit: Payer: Self-pay | Admitting: Family Medicine

## 2019-04-26 ENCOUNTER — Ambulatory Visit: Payer: PPO | Admitting: Neurology

## 2019-05-07 ENCOUNTER — Ambulatory Visit (INDEPENDENT_AMBULATORY_CARE_PROVIDER_SITE_OTHER): Payer: PPO | Admitting: Family Medicine

## 2019-05-07 ENCOUNTER — Encounter: Payer: Self-pay | Admitting: Family Medicine

## 2019-05-07 ENCOUNTER — Other Ambulatory Visit: Payer: Self-pay

## 2019-05-07 VITALS — BP 150/102 | HR 102 | Temp 98.1°F | Ht 76.5 in | Wt 296.8 lb

## 2019-05-07 DIAGNOSIS — R609 Edema, unspecified: Secondary | ICD-10-CM | POA: Diagnosis not present

## 2019-05-07 DIAGNOSIS — I10 Essential (primary) hypertension: Secondary | ICD-10-CM

## 2019-05-07 DIAGNOSIS — R1900 Intra-abdominal and pelvic swelling, mass and lump, unspecified site: Secondary | ICD-10-CM

## 2019-05-07 DIAGNOSIS — R600 Localized edema: Secondary | ICD-10-CM

## 2019-05-07 DIAGNOSIS — L03116 Cellulitis of left lower limb: Secondary | ICD-10-CM

## 2019-05-07 MED ORDER — CEFTRIAXONE SODIUM 1 G IJ SOLR
1.0000 g | Freq: Once | INTRAMUSCULAR | Status: AC
  Administered 2019-05-07: 17:00:00 1 g via INTRAMUSCULAR

## 2019-05-07 MED ORDER — CEPHALEXIN 500 MG PO CAPS: 500.0000 mg | ORAL_CAPSULE | Freq: Three times a day (TID) | ORAL | 0 refills | Status: DC

## 2019-05-07 NOTE — Progress Notes (Signed)
Chief Complaint  Patient presents with  . Edema    "all over"    History of Present Illness: HPI   44 year old male with schizoaffective disorder, HTN presents with new onset swelling " all over"  He has been on lasix  40 mg in past for peripheral edema . Swelling in legs is significantly worse in last several weeks. Yesterday noted left lower leg more red than right.. sore and warm to touch.  no fever.  No new SOB, no CP.  His blood pressure is up in the office today ans he is no longer taking losartan 100 mg daily. He had stopped that because it makes him dizzy. He is not checking BP at home.  He is taking metoprolol daily.  BP Readings from Last 3 Encounters:  05/07/19 (!) 150/102  12/28/18 132/86  12/04/18 131/85    He has gained 26 lbs in last 4 months. Wt Readings from Last 3 Encounters:  05/07/19 296 lb 12 oz (134.6 kg)  01/08/19 270 lb (122.5 kg)  12/28/18 279 lb 4 oz (126.7 kg)   No increased salt in diet - no canned soups, recent pork/ham, occasional frozen dinners.  Denies significant alcohol intake.  CT abd pelvis 03/2018 :  Hepatobiliary: The liver and gallbladder are unremarkable. No biliary dilatation.  This visit occurred during the SARS-CoV-2 public health emergency.  Safety protocols were in place, including screening questions prior to the visit, additional usage of staff PPE, and extensive cleaning of exam room while observing appropriate contact time as indicated for disinfecting solutions.   COVID 19 screen:  No recent travel or known exposure to COVID19 The patient denies respiratory symptoms of COVID 19 at this time. The importance of social distancing was discussed today.     Review of Systems  Constitutional: Negative for chills and fever.  HENT: Negative for congestion and ear pain.   Eyes: Negative for pain and redness.  Respiratory: Negative for cough and shortness of breath.   Cardiovascular: Positive for leg swelling. Negative for  chest pain and palpitations.  Gastrointestinal: Negative for abdominal pain, blood in stool, constipation, diarrhea, nausea and vomiting.  Genitourinary: Negative for dysuria.  Musculoskeletal: Negative for falls and myalgias.  Skin: Negative for rash.  Neurological: Negative for dizziness.  Psychiatric/Behavioral: Negative for depression. The patient is not nervous/anxious.       Past Medical History:  Diagnosis Date  . Anxiety   . Chronic leg pain   . Depression   . Essential hypertension, benign 10/20/2015  . Schizoaffective disorder (HCC)     reports that he has been smoking cigarettes. He started smoking about 23 years ago. He has a 23.00 pack-year smoking history. He has quit using smokeless tobacco. He reports that he does not drink alcohol or use drugs.   Current Outpatient Medications:  .  amantadine (SYMMETREL) 100 MG capsule, Take 100 mg by mouth 2 (two) times daily., Disp: , Rfl:  .  amphetamine-dextroamphetamine (ADDERALL) 20 MG tablet, Take 1 tablet by mouth in the morning, at noon, in the evening, and at bedtime. , Disp: , Rfl:  .  atorvastatin (LIPITOR) 40 MG tablet, TAKE 1 TABLET BY MOUTH EVERY DAY, Disp: 90 tablet, Rfl: 3 .  diazepam (VALIUM) 10 MG tablet, Take 10 mg by mouth 4 (four) times daily., Disp: , Rfl:  .  furosemide (LASIX) 20 MG tablet, TAKE 2 TABLETS BY MOUTH EVERY DAY, Disp: 180 tablet, Rfl: 0 .  gabapentin (NEURONTIN) 600 MG tablet, Take  1,200 mg by mouth 3 (three) times daily. , Disp: , Rfl:  .  omeprazole (PRILOSEC) 20 MG capsule, Take 20 mg by mouth daily as needed., Disp: , Rfl:  .  potassium chloride (KLOR-CON) 10 MEQ tablet, TAKE 1 TABLET BY MOUTH EVERY DAY, Disp: 90 tablet, Rfl: 1 .  propranolol (INDERAL) 20 MG tablet, Take 20 mg by mouth 4 (four) times daily., Disp: , Rfl:  .  risperiDONE (RISPERDAL) 2 MG tablet, Take 2 mg by mouth at bedtime., Disp: , Rfl:  .  tizanidine (ZANAFLEX) 2 MG capsule, TAKE 1 CAPSULE BY MOUTH 3 TIMES DAILY AS NEEDED FOR  MUSCLE SPASMS., Disp: 270 capsule, Rfl: 0 .  Vitamin D, Ergocalciferol, (DRISDOL) 50000 units CAPS capsule, Take 1 capsule (50,000 Units total) by mouth 2 (two) times a week., Disp: , Rfl:  .  ALPRAZolam (XANAX) 1 MG tablet, TAKE 1 TABLET 4 TIMES A DAY AS NEEDED *TBF 9/28, 10/28, 11/27, 12/27, 1/26, 2/25*STOP VALIUM*, Disp: , Rfl:  .  losartan (COZAAR) 100 MG tablet, TAKE 1 TABLET (100 MG TOTAL) BY MOUTH DAILY. PLEASE SCHEDULE PHYSICAL (Patient not taking: Reported on 05/07/2019), Disp: 90 tablet, Rfl: 0 .  Metoprolol Succinate 50 MG CS24, Take by mouth daily., Disp: , Rfl:    Observations/Objective: Blood pressure (!) 150/102, pulse (!) 102, temperature 98.1 F (36.7 C), temperature source Temporal, height 6' 4.5" (1.943 m), weight 296 lb 12 oz (134.6 kg), SpO2 90 %.  Physical Exam Vitals and nursing note reviewed.  Constitutional:      Appearance: Normal appearance. He is obese. He is not ill-appearing.  Eyes:     Extraocular Movements: Extraocular movements intact.     Pupils: Pupils are equal, round, and reactive to light.  Cardiovascular:     Rate and Rhythm: Normal rate and regular rhythm.     Pulses: Normal pulses.     Heart sounds: No murmur.  Pulmonary:     Effort: Pulmonary effort is normal. No respiratory distress.     Breath sounds: Normal breath sounds. No wheezing, rhonchi or rales.  Abdominal:     General: Abdomen is flat. There is distension.     Palpations: Abdomen is soft. There is no mass.     Tenderness: There is no abdominal tenderness. There is no guarding or rebound.     Hernia: No hernia is present.  Musculoskeletal:        General: No tenderness.     Right lower leg: Edema (1+ to knee) present.     Left lower leg: Edema (1+ to knee) present.  Skin:    General: Skin is warm and dry.     Findings: No erythema or rash.     Comments: Left lower leg with erythema anteriorly, marked with pen.  Neurological:     Mental Status: He is alert.  Psychiatric:         Mood and Affect: Mood normal.        Behavior: Behavior normal.      Assessment and Plan Essential hypertension, benign Likely poor control contributing to edema. Restart losartan. Follow BP.  Left leg cellulitis Treat with IM ceftriaxone and oral antibiotics. Close follow up.  Elevated foot and reduce edema to facilitate healing.   Peripheral edema Eval for secondary cause.. BNP, thyroid, CBC and CMET.  Swelling abdomen May need further eval.. no clear fluid wave at this time.         Eliezer Lofts, MD

## 2019-05-07 NOTE — Patient Instructions (Addendum)
Start antibiotics tomorrow orally.  Please stop at the lab to have labs drawn. Elevate legs as much  as possible when sitting above heart.  Restart losartan  50 mg daily ( 1/2 tabs of 100 mg ).  Take lasix 40 mg everyday.  Follow blood pressure daily at home.

## 2019-05-08 LAB — CBC WITH DIFFERENTIAL/PLATELET
Absolute Monocytes: 506 cells/uL (ref 200–950)
Basophils Absolute: 37 cells/uL (ref 0–200)
Basophils Relative: 0.4 %
Eosinophils Absolute: 147 cells/uL (ref 15–500)
Eosinophils Relative: 1.6 %
HCT: 50.6 % — ABNORMAL HIGH (ref 38.5–50.0)
Hemoglobin: 17.3 g/dL — ABNORMAL HIGH (ref 13.2–17.1)
Lymphs Abs: 3018 cells/uL (ref 850–3900)
MCH: 32 pg (ref 27.0–33.0)
MCHC: 34.2 g/dL (ref 32.0–36.0)
MCV: 93.7 fL (ref 80.0–100.0)
MPV: 10.7 fL (ref 7.5–12.5)
Monocytes Relative: 5.5 %
Neutro Abs: 5492 cells/uL (ref 1500–7800)
Neutrophils Relative %: 59.7 %
Platelets: 272 10*3/uL (ref 140–400)
RBC: 5.4 10*6/uL (ref 4.20–5.80)
RDW: 14.1 % (ref 11.0–15.0)
Total Lymphocyte: 32.8 %
WBC: 9.2 10*3/uL (ref 3.8–10.8)

## 2019-05-08 LAB — COMPREHENSIVE METABOLIC PANEL
AG Ratio: 1.6 (calc) (ref 1.0–2.5)
ALT: 36 U/L (ref 9–46)
AST: 21 U/L (ref 10–40)
Albumin: 4.2 g/dL (ref 3.6–5.1)
Alkaline phosphatase (APISO): 129 U/L (ref 36–130)
BUN: 7 mg/dL (ref 7–25)
CO2: 31 mmol/L (ref 20–32)
Calcium: 9.9 mg/dL (ref 8.6–10.3)
Chloride: 99 mmol/L (ref 98–110)
Creat: 0.97 mg/dL (ref 0.60–1.35)
Globulin: 2.7 g/dL (calc) (ref 1.9–3.7)
Glucose, Bld: 92 mg/dL (ref 65–99)
Potassium: 4.8 mmol/L (ref 3.5–5.3)
Sodium: 143 mmol/L (ref 135–146)
Total Bilirubin: 0.6 mg/dL (ref 0.2–1.2)
Total Protein: 6.9 g/dL (ref 6.1–8.1)

## 2019-05-08 LAB — BRAIN NATRIURETIC PEPTIDE: Brain Natriuretic Peptide: 16 pg/mL (ref <100)

## 2019-05-08 LAB — TSH: TSH: 1 mIU/L (ref 0.40–4.50)

## 2019-05-10 ENCOUNTER — Ambulatory Visit: Payer: Self-pay | Admitting: Urology

## 2019-05-11 ENCOUNTER — Other Ambulatory Visit: Payer: Self-pay

## 2019-05-11 ENCOUNTER — Ambulatory Visit (INDEPENDENT_AMBULATORY_CARE_PROVIDER_SITE_OTHER): Payer: PPO | Admitting: Family Medicine

## 2019-05-11 ENCOUNTER — Encounter: Payer: Self-pay | Admitting: Family Medicine

## 2019-05-11 VITALS — BP 120/90 | HR 94 | Temp 98.0°F | Ht 76.5 in | Wt 297.5 lb

## 2019-05-11 DIAGNOSIS — R609 Edema, unspecified: Secondary | ICD-10-CM

## 2019-05-11 DIAGNOSIS — I10 Essential (primary) hypertension: Secondary | ICD-10-CM | POA: Diagnosis not present

## 2019-05-11 DIAGNOSIS — L03116 Cellulitis of left lower limb: Secondary | ICD-10-CM | POA: Diagnosis not present

## 2019-05-11 DIAGNOSIS — R39198 Other difficulties with micturition: Secondary | ICD-10-CM | POA: Diagnosis not present

## 2019-05-11 DIAGNOSIS — R1900 Intra-abdominal and pelvic swelling, mass and lump, unspecified site: Secondary | ICD-10-CM | POA: Diagnosis not present

## 2019-05-11 MED ORDER — LOSARTAN POTASSIUM 100 MG PO TABS: 50.0000 mg | ORAL_TABLET | Freq: Every day | ORAL | 1 refills | Status: DC

## 2019-05-11 NOTE — Assessment & Plan Note (Signed)
Has not restarted BP med.. start losartan and call if SE back on 1/2 previous dose.

## 2019-05-11 NOTE — Assessment & Plan Note (Signed)
Improving on day 3 of keflex.Marland Kitchen elevate legs and increase exercise as tolerated.

## 2019-05-11 NOTE — Assessment & Plan Note (Signed)
Has follow up with urology. May consider flomax to treat as this helped pt in past with similar symptoms.

## 2019-05-11 NOTE — Assessment & Plan Note (Signed)
Has follow up with uro tommorow, uncelra if bladder retention given decreased UOP per pt despite lasix.   Abd pelvic CT last year showed no liver masses or ascites... but consider re-imaging if not improving.

## 2019-05-11 NOTE — Patient Instructions (Signed)
Complete antibiotics.  Elevated legs above heart.  Start losartan 50 mg daily for BP control.  Walk as much as able.

## 2019-05-11 NOTE — Assessment & Plan Note (Signed)
BNP nml and 2018 ECHO normal Labs unremarkable.  If minimal improvement  With improved BP control back on losartan 50 mg and UOP improving consider increase in lasix, abd pelvic imaging or referral to vascular.

## 2019-05-11 NOTE — Progress Notes (Signed)
Chief Complaint  Patient presents with  . Follow-up    Cellulitis    History of Present Illness: HPI  44 year old male with HTN presents for follow up left leg cellulitis and bilateral peripheral edema .   At last OV  Losartan was restarted at 1/2 dose... but he did not have it. BP Readings from Last 3 Encounters:  05/11/19 120/90  05/07/19 (!) 150/102  12/28/18 132/86    Started on  TID keflex for cellulitis... no SE.  Encouraged leg elevation and daily lasix.  Labs showed nml BNP, elevated Hg likely due to smoking, normal wbc, nml tsh and stable renal and liver function. He has appt tommorow to see urologist.. he is having troublke urinate  Wt Readings from Last 3 Encounters:  05/11/19 297 lb 8 oz (134.9 kg)  05/07/19 296 lb 12 oz (134.6 kg)  01/08/19 270 lb (122.5 kg)    2018 ECHO nml EF  CT abd Pelvis 2020: reviewed.  per last years uro note.. flomax was use for urinary retention  This visit occurred during the SARS-CoV-2 public health emergency.  Safety protocols were in place, including screening questions prior to the visit, additional usage of staff PPE, and extensive cleaning of exam room while observing appropriate contact time as indicated for disinfecting solutions.   COVID 19 screen:  No recent travel or known exposure to COVID19 The patient denies respiratory symptoms of COVID 19 at this time. The importance of social distancing was discussed today.     Review of Systems  Constitutional: Negative for chills and fever.  HENT: Negative for congestion and ear pain.   Eyes: Negative for pain and redness.  Respiratory: Negative for cough and shortness of breath.   Cardiovascular: Positive for leg swelling. Negative for chest pain and palpitations.  Gastrointestinal: Negative for abdominal pain, blood in stool, constipation, diarrhea, nausea and vomiting.  Genitourinary: Negative for dysuria.       Straining to urinate  Musculoskeletal: Negative for falls  and myalgias.  Skin: Negative for rash.  Neurological: Negative for dizziness.  Psychiatric/Behavioral: Negative for depression. The patient is not nervous/anxious.       Past Medical History:  Diagnosis Date  . Anxiety   . Chronic leg pain   . Depression   . Essential hypertension, benign 10/20/2015  . Schizoaffective disorder (HCC)     reports that he has been smoking cigarettes. He started smoking about 23 years ago. He has a 23.00 pack-year smoking history. He has quit using smokeless tobacco. He reports that he does not drink alcohol or use drugs.   Current Outpatient Medications:  .  ALPRAZolam (XANAX) 1 MG tablet, TAKE 1 TABLET 4 TIMES A DAY AS NEEDED *TBF 9/28, 10/28, 11/27, 12/27, 1/26, 2/25*STOP VALIUM*, Disp: , Rfl:  .  amantadine (SYMMETREL) 100 MG capsule, Take 100 mg by mouth 2 (two) times daily., Disp: , Rfl:  .  amphetamine-dextroamphetamine (ADDERALL) 20 MG tablet, Take 1 tablet by mouth in the morning, at noon, in the evening, and at bedtime. , Disp: , Rfl:  .  atorvastatin (LIPITOR) 40 MG tablet, TAKE 1 TABLET BY MOUTH EVERY DAY, Disp: 90 tablet, Rfl: 3 .  cephALEXin (KEFLEX) 500 MG capsule, Take 1 capsule (500 mg total) by mouth 3 (three) times daily., Disp: 21 capsule, Rfl: 0 .  diazepam (VALIUM) 10 MG tablet, Take 10 mg by mouth 4 (four) times daily., Disp: , Rfl:  .  furosemide (LASIX) 20 MG tablet, TAKE 2 TABLETS BY  MOUTH EVERY DAY, Disp: 180 tablet, Rfl: 0 .  gabapentin (NEURONTIN) 600 MG tablet, Take 1,200 mg by mouth 3 (three) times daily. , Disp: , Rfl:  .  Metoprolol Succinate 50 MG CS24, Take by mouth daily., Disp: , Rfl:  .  omeprazole (PRILOSEC) 20 MG capsule, Take 20 mg by mouth daily as needed., Disp: , Rfl:  .  potassium chloride (KLOR-CON) 10 MEQ tablet, TAKE 1 TABLET BY MOUTH EVERY DAY, Disp: 90 tablet, Rfl: 1 .  propranolol (INDERAL) 20 MG tablet, Take 20 mg by mouth 4 (four) times daily., Disp: , Rfl:  .  risperiDONE (RISPERDAL) 2 MG tablet, Take 2  mg by mouth at bedtime., Disp: , Rfl:  .  tizanidine (ZANAFLEX) 2 MG capsule, TAKE 1 CAPSULE BY MOUTH 3 TIMES DAILY AS NEEDED FOR MUSCLE SPASMS., Disp: 270 capsule, Rfl: 0 .  Vitamin D, Ergocalciferol, (DRISDOL) 50000 units CAPS capsule, Take 1 capsule (50,000 Units total) by mouth 2 (two) times a week., Disp: , Rfl:  .  losartan (COZAAR) 100 MG tablet, TAKE 1 TABLET (100 MG TOTAL) BY MOUTH DAILY. PLEASE SCHEDULE PHYSICAL (Patient not taking: Reported on 05/11/2019), Disp: 90 tablet, Rfl: 0   Observations/Objective: Blood pressure 120/90, pulse 94, temperature 98 F (36.7 C), temperature source Temporal, height 6' 4.5" (1.943 m), weight 297 lb 8 oz (134.9 kg), SpO2 95 %.  Physical Exam Constitutional:      Appearance: He is well-developed.     Comments: Neck bent over  HENT:     Head: Normocephalic.     Right Ear: Hearing normal.     Left Ear: Hearing normal.     Nose: Nose normal.  Neck:     Thyroid: No thyroid mass or thyromegaly.     Vascular: No carotid bruit.     Trachea: Trachea normal.  Cardiovascular:     Rate and Rhythm: Normal rate and regular rhythm.     Pulses: Normal pulses.     Heart sounds: Heart sounds not distant. No murmur. No friction rub. No gallop.      Comments: No peripheral edema Pulmonary:     Effort: Pulmonary effort is normal. No respiratory distress.     Breath sounds: Normal breath sounds.  Abdominal:     General: Abdomen is protuberant. Bowel sounds are normal. There is distension.     Palpations: There is no shifting dullness or fluid wave.     Tenderness: There is no abdominal tenderness.  Skin:    General: Skin is warm and dry.     Findings: No rash.     Comments: Erythema decreasing in left leg... has not extended beyond marker line   improving peripheral edema in bilateral legs.. still 2 plus pitting, decreased ROM of left knee given swelling up to knees  Psychiatric:        Speech: Speech normal.        Behavior: Behavior normal.         Thought Content: Thought content normal.      Assessment and Plan   Swelling abdomen Has follow up with uro tommorow, uncelra if bladder retention given decreased UOP per pt despite lasix.   Abd pelvic CT last year showed no liver masses or ascites... but consider re-imaging if not improving.  Left leg cellulitis Improving on day 3 of keflex.Marland Kitchen elevate legs and increase exercise as tolerated.  Difficulty urinating Has follow up with urology. May consider flomax to treat as this helped pt in past with similar  symptoms.  Peripheral edema BNP nml and 2018 ECHO normal Labs unremarkable.  If minimal improvement  With improved BP control back on losartan 50 mg and UOP improving consider increase in lasix, abd pelvic imaging or referral to vascular.  Essential hypertension, benign Has not restarted BP med.. start losartan and call if SE back on 1/2 previous dose.     Eliezer Lofts, MD

## 2019-05-12 ENCOUNTER — Ambulatory Visit: Payer: PPO | Admitting: Urology

## 2019-05-15 ENCOUNTER — Emergency Department: Payer: PPO

## 2019-05-15 ENCOUNTER — Inpatient Hospital Stay: Payer: PPO

## 2019-05-15 ENCOUNTER — Other Ambulatory Visit: Payer: Self-pay

## 2019-05-15 ENCOUNTER — Inpatient Hospital Stay
Admission: EM | Admit: 2019-05-15 | Discharge: 2019-05-17 | DRG: 602 | Disposition: A | Discharge status: Home / Self Care | Payer: PPO | Attending: Student | Admitting: Student

## 2019-05-15 DIAGNOSIS — I872 Venous insufficiency (chronic) (peripheral): Secondary | ICD-10-CM | POA: Diagnosis not present

## 2019-05-15 DIAGNOSIS — I10 Essential (primary) hypertension: Secondary | ICD-10-CM | POA: Diagnosis present

## 2019-05-15 DIAGNOSIS — M7989 Other specified soft tissue disorders: Secondary | ICD-10-CM

## 2019-05-15 DIAGNOSIS — Z79899 Other long term (current) drug therapy: Secondary | ICD-10-CM

## 2019-05-15 DIAGNOSIS — R0989 Other specified symptoms and signs involving the circulatory and respiratory systems: Secondary | ICD-10-CM

## 2019-05-15 DIAGNOSIS — N400 Enlarged prostate without lower urinary tract symptoms: Secondary | ICD-10-CM | POA: Diagnosis present

## 2019-05-15 DIAGNOSIS — Z818 Family history of other mental and behavioral disorders: Secondary | ICD-10-CM

## 2019-05-15 DIAGNOSIS — F17200 Nicotine dependence, unspecified, uncomplicated: Secondary | ICD-10-CM | POA: Diagnosis not present

## 2019-05-15 DIAGNOSIS — E559 Vitamin D deficiency, unspecified: Secondary | ICD-10-CM | POA: Diagnosis not present

## 2019-05-15 DIAGNOSIS — F419 Anxiety disorder, unspecified: Secondary | ICD-10-CM | POA: Diagnosis present

## 2019-05-15 DIAGNOSIS — I361 Nonrheumatic tricuspid (valve) insufficiency: Secondary | ICD-10-CM | POA: Diagnosis not present

## 2019-05-15 DIAGNOSIS — K5900 Constipation, unspecified: Secondary | ICD-10-CM | POA: Diagnosis not present

## 2019-05-15 DIAGNOSIS — R918 Other nonspecific abnormal finding of lung field: Secondary | ICD-10-CM | POA: Diagnosis not present

## 2019-05-15 DIAGNOSIS — F251 Schizoaffective disorder, depressive type: Secondary | ICD-10-CM | POA: Diagnosis present

## 2019-05-15 DIAGNOSIS — R609 Edema, unspecified: Secondary | ICD-10-CM | POA: Diagnosis present

## 2019-05-15 DIAGNOSIS — Z03818 Encounter for observation for suspected exposure to other biological agents ruled out: Secondary | ICD-10-CM | POA: Diagnosis not present

## 2019-05-15 DIAGNOSIS — F17210 Nicotine dependence, cigarettes, uncomplicated: Secondary | ICD-10-CM | POA: Diagnosis present

## 2019-05-15 DIAGNOSIS — F411 Generalized anxiety disorder: Secondary | ICD-10-CM | POA: Diagnosis not present

## 2019-05-15 DIAGNOSIS — G629 Polyneuropathy, unspecified: Secondary | ICD-10-CM | POA: Diagnosis present

## 2019-05-15 DIAGNOSIS — G8929 Other chronic pain: Secondary | ICD-10-CM | POA: Diagnosis not present

## 2019-05-15 DIAGNOSIS — I739 Peripheral vascular disease, unspecified: Secondary | ICD-10-CM | POA: Diagnosis not present

## 2019-05-15 DIAGNOSIS — E873 Alkalosis: Secondary | ICD-10-CM | POA: Diagnosis not present

## 2019-05-15 DIAGNOSIS — Z86718 Personal history of other venous thrombosis and embolism: Secondary | ICD-10-CM

## 2019-05-15 DIAGNOSIS — Z6833 Body mass index (BMI) 33.0-33.9, adult: Secondary | ICD-10-CM | POA: Diagnosis not present

## 2019-05-15 DIAGNOSIS — R7303 Prediabetes: Secondary | ICD-10-CM | POA: Diagnosis not present

## 2019-05-15 DIAGNOSIS — Z9189 Other specified personal risk factors, not elsewhere classified: Secondary | ICD-10-CM | POA: Diagnosis not present

## 2019-05-15 DIAGNOSIS — Z20822 Contact with and (suspected) exposure to covid-19: Secondary | ICD-10-CM | POA: Diagnosis present

## 2019-05-15 DIAGNOSIS — L03116 Cellulitis of left lower limb: Principal | ICD-10-CM | POA: Diagnosis present

## 2019-05-15 DIAGNOSIS — R739 Hyperglycemia, unspecified: Secondary | ICD-10-CM | POA: Diagnosis not present

## 2019-05-15 DIAGNOSIS — F259 Schizoaffective disorder, unspecified: Secondary | ICD-10-CM | POA: Diagnosis not present

## 2019-05-15 DIAGNOSIS — E669 Obesity, unspecified: Secondary | ICD-10-CM | POA: Diagnosis not present

## 2019-05-15 DIAGNOSIS — J810 Acute pulmonary edema: Secondary | ICD-10-CM | POA: Diagnosis not present

## 2019-05-15 DIAGNOSIS — J811 Chronic pulmonary edema: Secondary | ICD-10-CM

## 2019-05-15 DIAGNOSIS — M549 Dorsalgia, unspecified: Secondary | ICD-10-CM | POA: Diagnosis present

## 2019-05-15 DIAGNOSIS — Z8249 Family history of ischemic heart disease and other diseases of the circulatory system: Secondary | ICD-10-CM

## 2019-05-15 DIAGNOSIS — R140 Abdominal distension (gaseous): Secondary | ICD-10-CM

## 2019-05-15 LAB — CBC WITH DIFFERENTIAL/PLATELET
Abs Immature Granulocytes: 0.03 10*3/uL (ref 0.00–0.07)
Basophils Absolute: 0.1 10*3/uL (ref 0.0–0.1)
Basophils Relative: 1 %
Eosinophils Absolute: 0.2 10*3/uL (ref 0.0–0.5)
Eosinophils Relative: 2 %
HCT: 49.7 % (ref 39.0–52.0)
Hemoglobin: 16.9 g/dL (ref 13.0–17.0)
Immature Granulocytes: 0 %
Lymphocytes Relative: 32 %
Lymphs Abs: 3.2 10*3/uL (ref 0.7–4.0)
MCH: 32.4 pg (ref 26.0–34.0)
MCHC: 34 g/dL (ref 30.0–36.0)
MCV: 95.2 fL (ref 80.0–100.0)
Monocytes Absolute: 0.6 10*3/uL (ref 0.1–1.0)
Monocytes Relative: 6 %
Neutro Abs: 6.1 10*3/uL (ref 1.7–7.7)
Neutrophils Relative %: 59 %
Platelets: 254 10*3/uL (ref 150–400)
RBC: 5.22 MIL/uL (ref 4.22–5.81)
RDW: 14.2 % (ref 11.5–15.5)
WBC: 10.1 10*3/uL (ref 4.0–10.5)
nRBC: 0 % (ref 0.0–0.2)

## 2019-05-15 LAB — LACTIC ACID, PLASMA
Lactic Acid, Venous: 3.1 mmol/L (ref 0.5–1.9)
Lactic Acid, Venous: 2 mmol/L (ref 0.5–1.9)

## 2019-05-15 LAB — COMPREHENSIVE METABOLIC PANEL
ALT: 32 U/L (ref 0–44)
AST: 21 U/L (ref 15–41)
Albumin: 3.8 g/dL (ref 3.5–5.0)
Alkaline Phosphatase: 120 U/L (ref 38–126)
Anion gap: 11 (ref 5–15)
BUN: 8 mg/dL (ref 6–20)
CO2: 34 mmol/L — ABNORMAL HIGH (ref 22–32)
Calcium: 9.3 mg/dL (ref 8.9–10.3)
Chloride: 96 mmol/L — ABNORMAL LOW (ref 98–111)
Creatinine, Ser: 1.08 mg/dL (ref 0.61–1.24)
GFR calc Af Amer: >60 mL/min (ref >60)
GFR calc non Af Amer: >60 mL/min (ref >60)
Glucose, Bld: 131 mg/dL — ABNORMAL HIGH (ref 70–99)
Potassium: 3.9 mmol/L (ref 3.5–5.1)
Sodium: 141 mmol/L (ref 135–145)
Total Bilirubin: 0.6 mg/dL (ref 0.3–1.2)
Total Protein: 7 g/dL (ref 6.5–8.1)

## 2019-05-15 LAB — RESPIRATORY PANEL BY RT PCR (FLU A&B, COVID)
Influenza A by PCR: NEGATIVE
Influenza B by PCR: NEGATIVE
SARS Coronavirus 2 by RT PCR: NEGATIVE

## 2019-05-15 LAB — BRAIN NATRIURETIC PEPTIDE: B Natriuretic Peptide: 30 pg/mL (ref 0.0–100.0)

## 2019-05-15 LAB — PROCALCITONIN: Procalcitonin: <0.1 ng/mL

## 2019-05-15 MED ORDER — POTASSIUM CHLORIDE CRYS ER 20 MEQ PO TBCR
10.0000 meq | EXTENDED_RELEASE_TABLET | Freq: Every day | ORAL | Status: DC
  Administered 2019-05-16 – 2019-05-17 (×2): 10 meq via ORAL
  Filled 2019-05-15 (×2): qty 1

## 2019-05-15 MED ORDER — SODIUM CHLORIDE 0.9 % IV SOLN
1.0000 g | Freq: Once | INTRAVENOUS | Status: AC
  Administered 2019-05-15: 17:00:00 1 g via INTRAVENOUS
  Filled 2019-05-15: qty 10

## 2019-05-15 MED ORDER — AMANTADINE HCL 100 MG PO CAPS
100.0000 mg | ORAL_CAPSULE | Freq: Two times a day (BID) | ORAL | Status: DC
  Administered 2019-05-16 – 2019-05-17 (×3): 100 mg via ORAL
  Filled 2019-05-15 (×4): qty 1

## 2019-05-15 MED ORDER — RISPERIDONE 1 MG PO TABS
2.0000 mg | ORAL_TABLET | Freq: Every day | ORAL | Status: DC
  Administered 2019-05-15 – 2019-05-16 (×2): 2 mg via ORAL
  Filled 2019-05-15 (×4): qty 2

## 2019-05-15 MED ORDER — SODIUM CHLORIDE 0.9 % IV SOLN
250.0000 mL | INTRAVENOUS | Status: DC | PRN
  Administered 2019-05-16: 19:00:00 250 mL via INTRAVENOUS

## 2019-05-15 MED ORDER — ALPRAZOLAM 0.5 MG PO TABS: 0.5000 mg | ORAL_TABLET | Freq: Four times a day (QID) | ORAL | Status: DC

## 2019-05-15 MED ORDER — GABAPENTIN 600 MG PO TABS
1200.0000 mg | ORAL_TABLET | Freq: Three times a day (TID) | ORAL | Status: DC
  Administered 2019-05-15 – 2019-05-17 (×5): 1200 mg via ORAL
  Filled 2019-05-15 (×9): qty 2

## 2019-05-15 MED ORDER — SODIUM CHLORIDE 0.9% FLUSH: 3.0000 mL | Freq: Once | INTRAVENOUS | Status: DC

## 2019-05-15 MED ORDER — ACETAMINOPHEN 650 MG RE SUPP: 650.0000 mg | Freq: Four times a day (QID) | RECTAL | Status: DC | PRN

## 2019-05-15 MED ORDER — SODIUM CHLORIDE 0.9% FLUSH: 3.0000 mL | INTRAVENOUS | Status: DC | PRN

## 2019-05-15 MED ORDER — PROPRANOLOL HCL 20 MG PO TABS
20.0000 mg | ORAL_TABLET | Freq: Four times a day (QID) | ORAL | Status: DC
  Administered 2019-05-15 – 2019-05-17 (×5): 20 mg via ORAL
  Filled 2019-05-15 (×5): qty 1

## 2019-05-15 MED ORDER — LOSARTAN POTASSIUM 50 MG PO TABS
50.0000 mg | ORAL_TABLET | Freq: Every day | ORAL | Status: DC
  Administered 2019-05-16 – 2019-05-17 (×2): 50 mg via ORAL
  Filled 2019-05-15 (×2): qty 1

## 2019-05-15 MED ORDER — FUROSEMIDE 10 MG/ML IJ SOLN
40.0000 mg | Freq: Four times a day (QID) | INTRAMUSCULAR | Status: DC
  Administered 2019-05-15 – 2019-05-16 (×2): 40 mg via INTRAVENOUS
  Filled 2019-05-15 (×2): qty 4

## 2019-05-15 MED ORDER — TAMSULOSIN HCL 0.4 MG PO CAPS
0.4000 mg | ORAL_CAPSULE | Freq: Every day | ORAL | Status: DC
  Administered 2019-05-15 – 2019-05-17 (×3): 0.4 mg via ORAL
  Filled 2019-05-15 (×3): qty 1

## 2019-05-15 MED ORDER — SODIUM CHLORIDE 0.9% FLUSH
3.0000 mL | Freq: Two times a day (BID) | INTRAVENOUS | Status: DC
  Administered 2019-05-15 – 2019-05-17 (×3): 3 mL via INTRAVENOUS

## 2019-05-15 MED ORDER — VANCOMYCIN HCL 2000 MG/400ML IV SOLN
2000.0000 mg | Freq: Once | INTRAVENOUS | Status: AC
  Administered 2019-05-15: 19:00:00 2000 mg via INTRAVENOUS
  Filled 2019-05-15: qty 400

## 2019-05-15 MED ORDER — AMPHETAMINE-DEXTROAMPHETAMINE 10 MG PO TABS
20.0000 mg | ORAL_TABLET | Freq: Three times a day (TID) | ORAL | Status: DC
  Administered 2019-05-16 – 2019-05-17 (×3): 20 mg via ORAL
  Filled 2019-05-15 (×4): qty 2

## 2019-05-15 MED ORDER — ATORVASTATIN CALCIUM 20 MG PO TABS
40.0000 mg | ORAL_TABLET | Freq: Every day | ORAL | Status: DC
  Administered 2019-05-16 – 2019-05-17 (×2): 40 mg via ORAL
  Filled 2019-05-15 (×2): qty 2

## 2019-05-15 MED ORDER — TIZANIDINE HCL 2 MG PO TABS
2.0000 mg | ORAL_TABLET | Freq: Three times a day (TID) | ORAL | Status: DC | PRN
  Filled 2019-05-15: qty 1

## 2019-05-15 MED ORDER — ACETAMINOPHEN 325 MG PO TABS: 650.0000 mg | ORAL_TABLET | Freq: Four times a day (QID) | ORAL | Status: DC | PRN

## 2019-05-15 MED ORDER — PANTOPRAZOLE SODIUM 40 MG PO TBEC
40.0000 mg | DELAYED_RELEASE_TABLET | Freq: Every day | ORAL | Status: DC
  Administered 2019-05-15 – 2019-05-17 (×3): 40 mg via ORAL
  Filled 2019-05-15 (×3): qty 1

## 2019-05-15 MED ORDER — ENOXAPARIN SODIUM 40 MG/0.4ML ~~LOC~~ SOLN
40.0000 mg | SUBCUTANEOUS | Status: DC
  Filled 2019-05-15: qty 0.4

## 2019-05-15 MED ORDER — DIAZEPAM 5 MG PO TABS
10.0000 mg | ORAL_TABLET | Freq: Four times a day (QID) | ORAL | Status: DC
  Administered 2019-05-15 – 2019-05-17 (×5): 10 mg via ORAL
  Filled 2019-05-15 (×5): qty 2

## 2019-05-15 NOTE — ED Notes (Addendum)
Admitting dr at bedside.  

## 2019-05-15 NOTE — Consult Note (Signed)
PHARMACY -  BRIEF ANTIBIOTIC NOTE   Pharmacy has received consult(s) for vancomycin from an ED provider. Patient has already received ceftriaxone 1 g x1 in ED. The patient's profile has been reviewed for ht/wt/allergies/indication/available labs. No antibiotic drug allergies listed in chart.  Patient is a 44 y/o M presenting with lower extremity cellulitis with drainage. Patient was taking cephalexin prior to admission  One time order(s) placed for  -Vancomycin 2 g IV x 1  Further antibiotics/pharmacy consults should be ordered by admitting physician if indicated.                       Thank you, [name]  Pharmacy Resident 05/15/2019  5:54 PM

## 2019-05-15 NOTE — ED Notes (Signed)
Pt placed on 2L [place] due to O2 sats 88-91% when he falls asleep.  O2 currently at 95%.

## 2019-05-15 NOTE — ED Notes (Signed)
Pt transport requested  

## 2019-05-15 NOTE — ED Notes (Signed)
Pt transported to xray 

## 2019-05-15 NOTE — ED Notes (Signed)
EDT Genelle Bal attempted blood draw with no success. Pt then taken to room.

## 2019-05-15 NOTE — ED Notes (Signed)
US at bedside

## 2019-05-15 NOTE — ED Triage Notes (Addendum)
Pt comes via POV from home with c/o left foot and leg pain. Pt states this started several weeks ago. Family states it has since gotten worse over the last 3 weeks.  Pt states he was also prescribed antibiotics for some redness on his left shin. Pt states now he has more swelling to leg and foot. Pt states it has now opened and is draining fluid. Pt unsure if it is clear.  Pt states now his right leg is starting to do the same thing. Pt states possible diabetes.

## 2019-05-15 NOTE — ED Notes (Signed)
ED Provider at bedside. 

## 2019-05-15 NOTE — ED Notes (Signed)
Transported to xray 

## 2019-05-15 NOTE — ED Provider Notes (Signed)
[facility] Emergency Department Provider Note  ____________________________________________   First MD Initiated Contact with Patient 05/15/19 1508     (approximate)  I have reviewed the triage vital signs and the nursing notes.  History  Chief Complaint Leg Pain    HPI Jeffrey Lin is a 44 y.o. male with hx of HTN, schizoaffective disorder, DVT (after patellar fracture, not no anticoagulation) who presents to the emergency department for bilateral leg swelling as well as cellulitis to the LLE.  Patient reports ongoing swelling to the bilateral lower extremities over the last several weeks, but has been particularly worsened over the last week or so.  Since 4/16 he also developed superimposed redness, warmth, cellulitis to the anterior left shin.  He was started on Keflex by his PCP as well as Lasix.  His redness has not extended past the markings placed by the PCP, however he still does not feel like it has improved. Additionally his leg swelling has only persisted, and truthfully seems to have worsened; today when he took off his shoes even just removing his sock caused a skin tear and serous weeping from the top of his foot.  He denies any chest pain or shortness of breath.  No fevers or vomiting.   Past Medical Hx Past Medical History:  Diagnosis Date  . Anxiety   . Chronic leg pain   . Depression   . Essential hypertension, benign 10/20/2015  . Schizoaffective disorder [facility])     Problem List Patient Active Problem List   Diagnosis Date Noted  . Left leg cellulitis 05/11/2019  . Swelling abdomen 05/11/2019  . Thiamine deficiency 12/28/2018  . Pneumonia 03/28/2018  . Subacute delirium 03/23/2018  . Schizoaffective disorder (HCC) 03/22/2018  . Difficulty urinating 02/03/2018  . Peripheral neuropathy 05/07/2017  . Folate deficiency 05/07/2017  . Pleural effusion, left 04/12/2016  . Peripheral edema 02/29/2016  . Hoarseness of voice 11/23/2015   . Essential hypertension, benign 10/20/2015  . Schizoaffective disorder, depressive type (HCC) 02/17/2015  . Vitamin D deficiency 02/14/2015  . Low back pain 10/27/2014  . Acute upper back pain 05/21/2013  . 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 History of DVT (deep vein thrombosis) 07/06/2010  . History of patellar fracture 07/06/2010  . HYPERCHOLESTEROLEMIA 10/13/2009  . PREDIABETES 10/13/2009  . Anxiety state 06/02/2006  . TOBACCO ABUSE 06/02/2006  . Chronic back pain 06/02/2006    Past Surgical Hx Past Surgical History:  Procedure Laterality Date  . Block procedure at pain center  03/27/06  . Bone graft from hip to jaw  2004  . Bone graft right side of head to jaw  2006  . 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 Sphenopalatine ganglionic      Medications Prior to Admission medications   Medication Sig Start Date End Date Taking? Authorizing Provider  ALPRAZolam Prudy Feeler) 1 MG tablet TAKE 1 TABLET 4 TIMES A DAY AS NEEDED *TBF 9/28, 10/28, 11/27, 12/27, 1/26, 2/25*STOP VALIUM* 11/17/18   [provider]  amantadine (SYMMETREL) 100 MG capsule Take 100 mg by mouth 2 (two) times daily.    [provider]  amphetamine-dextroamphetamine (ADDERALL) 20 MG tablet Take 1 tablet by mouth in the morning, at noon, in the evening, and at bedtime.  08/08/18   [provider]  atorvastatin (LIPITOR) 40 MG tablet TAKE 1 TABLET BY MOUTH EVERY DAY 04/20/18   Bedsole, Amy E, MD  cephALEXin (KEFLEX) 500 MG capsule Take 1 capsule (500  mg total) by mouth 3 (three) times daily. 05/07/19   Bedsole, Amy E, MD  diazepam (VALIUM) 10 MG tablet Take 10 mg by mouth 4 (four) times daily. 05/22/18   [provider]  furosemide (LASIX) 20 MG tablet TAKE 2 TABLETS BY MOUTH EVERY DAY 04/13/19   Bedsole, Amy E, MD  gabapentin (NEURONTIN) 600 MG tablet Take 1,200 mg by mouth 3 (three) times daily.     [provider]  losartan (COZAAR) 100 MG tablet Take 0.5 tablets  (50 mg total) by mouth daily. 05/11/19   Bedsole, Amy E, MD  Metoprolol Succinate 50 MG CS24 Take by mouth daily.    [provider]  omeprazole (PRILOSEC) 20 MG capsule Take 20 mg by mouth daily as needed.    [provider]  potassium chloride (KLOR-CON) 10 MEQ tablet TAKE 1 TABLET BY MOUTH EVERY DAY 03/18/19   Bedsole, Amy E, MD  propranolol (INDERAL) 20 MG tablet Take 20 mg by mouth 4 (four) times daily. 06/02/18   [provider]  risperiDONE (RISPERDAL) 2 MG tablet Take 2 mg by mouth at bedtime. 11/24/18   [provider]  tizanidine (ZANAFLEX) 2 MG capsule TAKE 1 CAPSULE BY MOUTH 3 TIMES DAILY AS NEEDED FOR MUSCLE SPASMS. 03/02/19   Bedsole, Amy E, MD  Vitamin D, Ergocalciferol, (DRISDOL) 50000 units CAPS capsule Take 1 capsule (50,000 Units total) by mouth 2 (two) times a week. 07/01/16   Joaquim Nam, MD    Allergies Toradol [ketorolac tromethamine] and Lamotrigine  Family Hx Family History  Problem Relation Age of Onset  . Hyperlipidemia Mother   . Hypertension Mother   . Depression Mother   . Anxiety disorder Mother   . Lung disease Father   . Depression Brother   . Anxiety disorder Brother   . Bipolar disorder Maternal Grandfather   . Schizophrenia Maternal Grandfather   . Bipolar disorder Paternal Grandfather     Social Hx Social History   Tobacco Use  . Smoking status: Current Every Day Smoker    Packs/day: 1.00    Years: 23.00    Pack years: 23.00    Types: Cigarettes    Start date: 09/22/1995  . Smokeless tobacco: Former Engineer, water Use Topics  . Alcohol use: No    Alcohol/week: 0.0 standard drinks  . Drug use: No     Review of Systems  Constitutional: Negative for fever. Negative for chills. Eyes: Negative for visual changes. ENT: Negative for sore throat. Cardiovascular: Negative for chest pain. Respiratory: Negative for shortness of breath. Gastrointestinal: Negative for nausea. Negative for vomiting.   Genitourinary: Negative for dysuria. Musculoskeletal: + leg swelling Skin: + redness to shin, weeping from skin tear to left foot Neurological: Negative for headaches.   Physical Exam  Vital Signs: ED Triage Vitals  Enc Vitals Group     BP 05/15/19 1530 120/81     Pulse Rate 05/15/19 1530 (!) 111     Resp 05/15/19 1848 14     Temp --      Temp src --      SpO2 05/15/19 1530 94 %     Weight 05/15/19 1447 300 lb (136.1 kg)     Height 05/15/19 1447 6\' 6"  (1.981 m)     Head Circumference --      Peak Flow --      Pain Score 05/15/19 1447 9     Pain Loc --      Pain Edu? --  Excl. in [place]? --      Constitutional: Alert and oriented. Obese.  Head: Normocephalic. Atraumatic. Eyes: Conjunctivae clear. Sclera anicteric. Pupils equal and symmetric. Nose: No masses or lesions. No congestion or rhinorrhea. Mouth/Throat: Wearing mask.  Neck: No stridor. Trachea midline.  Cardiovascular: Tachycardia, regular rhythm. Extremities well perfused. Respiratory: Normal respiratory effort. Faint scattered wheezing. No hypoxia.  Gastrointestinal: Soft. Non-distended. Non-tender.  Genitourinary: Deferred. Musculoskeletal: Significant BLE pitting edema up to the level of at least the knee, symmetric.  Neurologic:  Normal speech and language. No gross focal or lateralizing neurologic deficits are appreciated.  Skin: Erythema and warmth of the anterior left shin. TTP. No crepitance, fluctuance, or abscess.  Area has not extended past the markings in place by PCP.  Skin tear to the dorsum of the left foot with serous weeping. Psychiatric: Mood and affect are appropriate for situation.    Radiology  Personally reviewed available imaging myself.   US venous BLE - IMPRESSION:  No femoropopliteal DVT nor evidence of DVT within the visualized  calf veins.   CXR - IMPRESSION:  Mild, diffuse interstitial pulmonary opacity, consistent with edema  or infection.    Procedures  Procedure(s)  performed (including critical care):  Procedures   Initial Impression / Assessment and Plan / MDM / ED Course  44 y.o. male who presents to the ED for bilateral lower extremity swelling as well as ongoing cellulitis to the LLE.  Ddx: DVT, cellulitis, new onset heart failure  Will plan for labs, imaging, ultrasound  Ultrasound negative for DVT.  BNP within normal limits, however CXR concerning for possible interstitial edema.  Clinical picture concerning for possible new onset heart failure.  Normal WBC count, however initial lactic acid elevated at 3.1.  With his persistent cellulitis despite outpatient antibiotics and elevated lactic, concern for failed outpatient treatment of his cellulitis. Will treat with IV antibiotics and plan for admission for further treatment of cellulitis, as well as work-up of his swelling and possible new onset heart failure.  Repeat lactate downtrending to 2.  Patient agreeable with plan and admission. _______________________________   As part of my medical decision making I have reviewed available labs, radiology tests, reviewed old records/performed chart review, obtained additional history from family at bedside.   Final Clinical Impression(s) / ED Diagnosis  Final diagnoses:  Cellulitis of left lower extremity  Leg swelling       Note:  This document was prepared using Dragon voice recognition software and may include unintentional dictation errors.   Lilia Pro., MD 05/15/19 [phone]

## 2019-05-15 NOTE — H&P (Signed)
History and Physical    Jeffrey Lin INO:[id number] DOB: 06-03-1975 DOA: 05/15/2019  PCP: Excell Seltzer, MD (Confirm with patient/family/NH records and if not entered, this has to be entered at [facility] point of entry) Patient coming from: home  I have personally briefly reviewed patient's old medical records in [facility]  Chief Complaint: painful red leg and skin tear  HPI: Jeffrey Lin is a 44 y.o. male with medical history significant of HTN, peripheral edema schizoaffective disorder, obesity who has been followed for several weeks for peripheral edema that has been slow to respond to oral diuretics. He was seen 05/11/19 by Dr. Ermalene Searing, PCP, and diagnosed with cellulitis of the distal left LE and was started on Keflex. He has not had fever or chills. He has taken his medication and the erythema has receded from the ink markings made by Dr. Ermalene Searing. Today when he took off his sock he tore the skin of a blister on the dorsum of his left foot. He has not had any chest pain or respiratory distress. TR[facility] is consulted for admission to continue IV abx and to address mild pulmonary edema and peripheral edema   ED Course: Hemodynamically stable in the ED. For refractory cellulits he was given a dose of VAncomycin and Rocephin. Routine lab notable for BNP 30, Cr 1.08 Lactic acid 3.1 and 2nd draw was 2.0. CXR with mild pulmonary edema  Review of Systems: As per HPI otherwise 10 point review of systems negative. Constipation with last BM 3-4 days ago.    Past Medical History:  Diagnosis Date  . Anxiety   . Chronic leg pain   . Depression   . Essential hypertension, benign 10/20/2015  . Schizoaffective disorder [facility])     Past Surgical History:  Procedure Laterality Date  . Block procedure at pain center  03/27/06  . Bone graft from hip to jaw  2004  . Bone graft right side of head to jaw  2006  . 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 Sphenopalatine ganglionic     Soc Hx- HSG, self  taught machinist. 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 He is on disability 2/2 chronic back problems and schizoaffective disorder.   reports that he has been smoking cigarettes. He started smoking about 23 years ago. He has a 23.00 pack-year smoking history. He has quit using smokeless tobacco. He reports that he does not drink alcohol or use drugs.  Allergies  Allergen Reactions  . Toradol [Ketorolac Tromethamine] Swelling  . Lamotrigine Rash    Family History  Problem Relation Age of Onset  . Hyperlipidemia Mother   . Hypertension Mother   . Depression Mother   . Anxiety disorder Mother   . Lung disease Father   . Depression Brother   . Anxiety disorder Brother   . Bipolar disorder Maternal Grandfather   . Schizophrenia Maternal Grandfather   . Bipolar disorder Paternal Grandfather    Unacceptable: Noncontributory, unremarkable, or negative. Acceptable: Family history reviewed and not pertinent (If you reviewed it)  Prior to Admission medications   Medication Sig Start Date End Date Taking? Authorizing Provider  amantadine (SYMMETREL) 100 MG capsule Take 100 mg by mouth 2 (two) times daily.   Yes [provider]  amphetamine-dextroamphetamine (ADDERALL) 20 MG tablet Take 1 tablet by mouth in the morning, at noon, in the evening, and at bedtime.  08/08/18  Yes [provider]  atorvastatin (LIPITOR) 40 MG tablet TAKE 1 TABLET BY  MOUTH EVERY DAY 04/20/18  Yes Bedsole, Amy E, MD  cephALEXin (KEFLEX) 500 MG capsule Take 1 capsule (500 mg total) by mouth 3 (three) times daily. 05/07/19  Yes Bedsole, Amy E, MD  diazepam (VALIUM) 10 MG tablet Take 10 mg by mouth 4 (four) times daily. 05/22/18  Yes [provider]  furosemide (LASIX) 20 MG tablet TAKE 2 TABLETS BY MOUTH EVERY DAY 04/13/19  Yes Bedsole, Amy E, MD  gabapentin (NEURONTIN) 600 MG tablet Take 1,200 mg by mouth 3 (three) times daily.    Yes [provider]  losartan (COZAAR) 100 MG  tablet Take 0.5 tablets (50 mg total) by mouth daily. 05/11/19  Yes Bedsole, Amy E, MD  omeprazole (PRILOSEC) 20 MG capsule Take 20 mg by mouth daily as needed.   Yes [provider]  potassium chloride (KLOR-CON) 10 MEQ tablet TAKE 1 TABLET BY MOUTH EVERY DAY 03/18/19  Yes Bedsole, Amy E, MD  propranolol (INDERAL) 20 MG tablet Take 20 mg by mouth 4 (four) times daily. 06/02/18  Yes [provider]  risperiDONE (RISPERDAL) 2 MG tablet Take 2 mg by mouth at bedtime. 11/24/18  Yes [provider]  tizanidine (ZANAFLEX) 2 MG capsule TAKE 1 CAPSULE BY MOUTH 3 TIMES DAILY AS NEEDED FOR MUSCLE SPASMS. 03/02/19  Yes Bedsole, Amy E, MD  Vitamin D, Ergocalciferol, (DRISDOL) 50000 units CAPS capsule Take 1 capsule (50,000 Units total) by mouth 2 (two) times a week. 07/01/16  Yes Joaquim Nam, MD  ALPRAZolam Prudy Feeler) 1 MG tablet TAKE 1 TABLET 4 TIMES A DAY AS NEEDED *TBF 9/28, 10/28, 11/27, 12/27, 1/26, 2/25*STOP VALIUM* 11/17/18   [provider]    Physical Exam: Vitals:   05/15/19 1645 05/15/19 1700 05/15/19 1845 05/15/19 1848  BP:    (!) 141/96  Pulse: (!) 109 (!) 113 (!) 115 (!) 111  Resp:    14  SpO2: 92% 96% 91% 100%  Weight:      Height:        Constitutional: NAD, calm, comfortable Vitals:   05/15/19 1645 05/15/19 1700 05/15/19 1845 05/15/19 1848  BP:    (!) 141/96  Pulse: (!) 109 (!) 113 (!) 115 (!) 111  Resp:    14  SpO2: 92% 96% 91% 100%  Weight:      Height:       General - overweight man in no acute distress. He has generaled erythema UE, torso and LE. Eyes: PERRL, lids and conjunctivae normal ENMT: Mucous membranes are moist.  Neck: normal, supple, no masses, no thyromegaly Respiratory: clear to auscultation bilaterally, no wheezing, no crackles. Normal respiratory effort. No accessory muscle use.  Cardiovascular: Regular rate and rhythm, no murmurs / rubs / gallops. No extremity edema. 2+ pedal pulses. No carotid bruits.  Abdomen: Distended,  mild tenderness suprapubic region, no masses palpated. No hepatosplenomegaly but erxam hindered by girth and distention. Bowel sounds hypoative to absent.  Musculoskeletal: no clubbing / cyanosis. No joint deformity upper and lower extremities. Good ROM, no contractures. Normal muscle tone.  Skin: generalized erythema, marked erythem distal Left LE, warm to touch. Ink lines reveal receding erythema from 05/11/19. Small lesion medial aspect with eschar. Pedal edema bilaterally with 2 x3 cm skin tear dorsum left foot. Neurologic: CN 2-12 grossly intact.  Strength 5/5 in all 4.  Psychiatric: Normal judgment and insight. Alert and oriented x 3. Normal mood.     Labs on Admission: I have personally reviewed following labs and imaging studies  CBC:  Recent Labs  Lab 05/15/19 1449  WBC 10.1  NEUTROABS 6.1  HGB 16.9  HCT 49.7  MCV 95.2  PLT 254   Basic Metabolic Panel: Recent Labs  Lab 05/15/19 1449  NA 141  K 3.9  CL 96*  CO2 34*  GLUCOSE 131*  BUN 8  CREATININE 1.08  CALCIUM 9.3   GFR: Estimated Creatinine Clearance: 137.7 mL/min (by C-G formula based on SCr of 1.08 mg/dL). Liver Function Tests: Recent Labs  Lab 05/15/19 1449  AST 21  ALT 32  ALKPHOS 120  BILITOT 0.6  PROT 7.0  ALBUMIN 3.8   No results for input(s): LIPASE, AMYLASE in the last 168 hours. No results for input(s): AMMONIA in the last 168 hours. Coagulation Profile: No results for input(s): INR, PROTIME in the last 168 hours. Cardiac Enzymes: No results for input(s): CKTOTAL, CKMB, CKMBINDEX, TROPONINI in the last 168 hours. BNP (last 3 results) No results for input(s): PROBNP in the last 8760 hours. HbA1C: No results for input(s): HGBA1C in the last 72 hours. CBG: No results for input(s): GLUCAP in the last 168 hours. Lipid Profile: No results for input(s): CHOL, HDL, LDLCALC, TRIG, CHOLHDL, LDLDIRECT in the last 72 hours. Thyroid Function Tests: No results for input(s): TSH, T4TOTAL, FREET4,  T3FREE, THYROIDAB in the last 72 hours. Anemia Panel: No results for input(s): VITAMINB12, FOLATE, FERRITIN, TIBC, IRON, RETICCTPCT in the last 72 hours. Urine analysis:    Component Value Date/Time   COLORURINE YELLOW (A) 04/07/2018 1839   APPEARANCEUR CLEAR (A) 04/07/2018 1839   APPEARANCEUR Clear 02/04/2013 0115   LABSPEC 1.009 04/07/2018 1839   LABSPEC 1.027 02/04/2013 0115   PHURINE 7.0 04/07/2018 1839   GLUCOSEU NEGATIVE 04/07/2018 1839   GLUCOSEU Negative 02/04/2013 0115   HGBUR LARGE (A) 04/07/2018 1839   BILIRUBINUR negative 12/28/2018 1530   BILIRUBINUR Negative 02/04/2013 0115   KETONESUR NEGATIVE 04/07/2018 1839   PROTEINUR Negative 12/28/2018 1530   PROTEINUR NEGATIVE 04/07/2018 1839   UROBILINOGEN 0.2 12/28/2018 1530   UROBILINOGEN 1.0 04/27/2012 0856   NITRITE negative 12/28/2018 1530   NITRITE NEGATIVE 04/07/2018 1839   LEUKOCYTESUR Negative 12/28/2018 1530   LEUKOCYTESUR TRACE (A) 04/07/2018 1839   LEUKOCYTESUR Negative 02/04/2013 0115    Radiological Exams on Admission: DG Chest 2 View  Result Date: 05/15/2019 CLINICAL DATA:  Leg swelling, history of DVT EXAM: CHEST - 2 VIEW COMPARISON:  04/03/2018 FINDINGS: The heart size and mediastinal contours are within normal limits. Mild, diffuse interstitial pulmonary opacity. The visualized skeletal structures are unremarkable. IMPRESSION: Mild, diffuse interstitial pulmonary opacity, consistent with edema or infection. Electronically Signed   By: Lauralyn Primes M.D.   On: 05/15/2019 16:44   US Venous Img Lower Bilateral  Result Date: 05/15/2019 CLINICAL DATA:  44 year old male with bilateral lower extremity swelling and redness. EXAM: Bilateral LOWER EXTREMITY VENOUS DOPPLER ULTRASOUND TECHNIQUE: Gray-scale sonography with compression, as well as color and duplex ultrasound, were performed to evaluate the deep venous system(s) from the level of the common femoral vein through the popliteal and proximal calf veins.  COMPARISON:  None. FINDINGS: Evaluation is limited due to body habitus and suboptimal visualization of the vessels. VENOUS Normal compressibility of the common femoral, superficial femoral, and popliteal veins, as well as the visualized calf veins. Visualized portions of profunda femoral vein and great saphenous vein unremarkable. No filling defects to suggest DVT on grayscale or color Doppler imaging. Doppler waveforms show normal direction of venous flow, normal respiratory phasicity and response to augmentation. OTHER There  is diffuse subcutaneous edema of the calf. Limitations: The calf veins are poorly visualized due to edema. IMPRESSION: No femoropopliteal DVT nor evidence of DVT within the visualized calf veins. If clinical symptoms are inconsistent or if there are persistent or worsening symptoms, further imaging (possibly involving the iliac veins) may be warranted. Electronically Signed   By: Anner Crete M.D.   On: 05/15/2019 16:24    EKG: Independently reviewed. 04/05/19 NSR with incomplete RBBB. No change from 03/29/18  Assessment/Plan Active Problems:   Peripheral edema   Left leg cellulitis   Anxiety state   Schizoaffective disorder, depressive type ([place])   Essential hypertension, benign   Chronic back pain   Cellulitis of left leg  (please populate well all problems here in Problem List. (For example, if patient is on BP meds at home and you resume or decide to hold them, it is a problem that needs to be her. Same for CAD, COPD, HLD and so on)   1. Cellulitis left LE - Patient with some response to keflex but had elevated lactic acid and worsening erythema. He has generalized erythema that seems to have developed after receiving IV Vanc. PLan Med-surg admit  Continue rocephin 1 g q2 x 1 dose than switch to ceftin for outpt management  Dry dressing to skin tear dorsum left foot.   2. Pulmonary edema and peripheral edema. No DVT by u/s. NO prior h/o CHF but now with mild  pulmonary edema on CXR. Risk - weight, LVH, HTN. No respiratory distress. Plan IV lasix 40 mg IV q 6 x 2 doses   F/u CXR 05/16/19  2D echo to reassess cardiac function  3. HTN- continue home meds  4. Psych - patient sees two psychiatrists - in in [place] and one in [place] depending on where he is staying. Plan  Continue present meds - he reports he is currently NOT taking Xanax but is taking   Valium 10 mg QID  5. Urol - slow stream most likely BPH. Had been on flomax in the past Plan Restart flomax  For bladder distention and inability to void he may need foley catheter.  6. Abdolminal distention and absent BS - may be constipation. Last KUB April 2020 Plan 2 view abdomen  DVT prophylaxis: lovenox  Code Status: full code  Family Communication: mother present during exam: all questions answered Disposition Plan: home 24-48 hours  Consults called: none Admission status: inpatient    Adella Hare MD [facility] Pager [phone]  If 7PM-7AM, please contact night-coverage [email or web address] Password [facility]  05/15/2019, 7:24 PM

## 2019-05-16 ENCOUNTER — Inpatient Hospital Stay: Payer: PPO

## 2019-05-16 ENCOUNTER — Inpatient Hospital Stay (HOSPITAL_COMMUNITY)
Admit: 2019-05-16 | Discharge: 2019-05-16 | Disposition: A | Discharge status: Home / Self Care | Payer: PPO | Attending: Internal Medicine | Admitting: Internal Medicine

## 2019-05-16 DIAGNOSIS — Z9189 Other specified personal risk factors, not elsewhere classified: Secondary | ICD-10-CM

## 2019-05-16 DIAGNOSIS — F17200 Nicotine dependence, unspecified, uncomplicated: Secondary | ICD-10-CM

## 2019-05-16 DIAGNOSIS — I361 Nonrheumatic tricuspid (valve) insufficiency: Secondary | ICD-10-CM

## 2019-05-16 DIAGNOSIS — F259 Schizoaffective disorder, unspecified: Secondary | ICD-10-CM

## 2019-05-16 DIAGNOSIS — K5900 Constipation, unspecified: Secondary | ICD-10-CM

## 2019-05-16 DIAGNOSIS — R7303 Prediabetes: Secondary | ICD-10-CM

## 2019-05-16 DIAGNOSIS — R0989 Other specified symptoms and signs involving the circulatory and respiratory systems: Secondary | ICD-10-CM

## 2019-05-16 DIAGNOSIS — R739 Hyperglycemia, unspecified: Secondary | ICD-10-CM

## 2019-05-16 LAB — HEMOGLOBIN A1C
Hgb A1c MFr Bld: 6.3 % — ABNORMAL HIGH (ref 4.8–5.6)
Mean Plasma Glucose: 134.11 mg/dL

## 2019-05-16 LAB — BASIC METABOLIC PANEL
Anion gap: 10 (ref 5–15)
BUN: 8 mg/dL (ref 6–20)
CO2: 36 mmol/L — ABNORMAL HIGH (ref 22–32)
Calcium: 9.3 mg/dL (ref 8.9–10.3)
Chloride: 94 mmol/L — ABNORMAL LOW (ref 98–111)
Creatinine, Ser: 1.2 mg/dL (ref 0.61–1.24)
GFR calc Af Amer: >60 mL/min (ref >60)
GFR calc non Af Amer: >60 mL/min (ref >60)
Glucose, Bld: 120 mg/dL — ABNORMAL HIGH (ref 70–99)
Potassium: 4.3 mmol/L (ref 3.5–5.1)
Sodium: 140 mmol/L (ref 135–145)

## 2019-05-16 LAB — HIV ANTIBODY (ROUTINE TESTING W REFLEX): HIV Screen 4th Generation wRfx: NONREACTIVE

## 2019-05-16 LAB — ECHOCARDIOGRAM COMPLETE
Height: 78 in
Weight: 4687.86 oz

## 2019-05-16 MED ORDER — SENNOSIDES-DOCUSATE SODIUM 8.6-50 MG PO TABS: 1.0000 | ORAL_TABLET | Freq: Two times a day (BID) | ORAL | Status: DC | PRN

## 2019-05-16 MED ORDER — NICOTINE 21 MG/24HR TD PT24
21.0000 mg | MEDICATED_PATCH | Freq: Every day | TRANSDERMAL | Status: DC
  Filled 2019-05-16 (×2): qty 1

## 2019-05-16 MED ORDER — SODIUM CHLORIDE 0.9 % IV SOLN
2.0000 g | INTRAVENOUS | Status: DC
  Administered 2019-05-16: 19:00:00 2 g via INTRAVENOUS
  Filled 2019-05-16: qty 2
  Filled 2019-05-16: qty 20

## 2019-05-16 MED ORDER — POLYETHYLENE GLYCOL 3350 17 G PO PACK
17.0000 g | PACK | Freq: Two times a day (BID) | ORAL | Status: DC | PRN
  Administered 2019-05-17: 09:00:00 17 g via ORAL
  Filled 2019-05-16: qty 1

## 2019-05-16 MED ORDER — FUROSEMIDE 10 MG/ML IJ SOLN
40.0000 mg | Freq: Two times a day (BID) | INTRAMUSCULAR | Status: DC
  Administered 2019-05-16 – 2019-05-17 (×2): 40 mg via INTRAVENOUS
  Filled 2019-05-16 (×3): qty 4

## 2019-05-16 NOTE — Progress Notes (Signed)
PROGRESS NOTE  Jeffrey Lin OVF:[id number] DOB: May 16, 1975   PCP: Excell Seltzer, MD  Patient is from: Home.  Lives with his mother.  DOA: 05/15/2019 LOS: 1  Brief Narrative / Interim history: 44 year old male with history of HTN, edema, schizoaffective disorder, obesity and tobacco use disorder presenting with lower extremity cellulitis, edema and and a skin tear over left foot.  Has been on Lasix for lower extremity edema without significant response.  He was a started on Keflex for cellulitis by PCP on 4/20 without significant response.  In ED, hemodynamically stable.  Afebrile.  CBC unremarkable.  CMP with mild alkalosis with bicarb to 34.  Lactic acid 3.1> 2.0.  Pro-Cal negative.  BNP within normal.  COVID-19 influenza PCR negative.  CXR with some vascular congestion.  Lower extremity venous Doppler negative for DVT.  Started on vancomycin and ceftriaxone.  Admitted for lower extremity cellulitis/edema on IV ceftriaxone and IV Lasix.  Echocardiogram ordered.  Subjective: Seen and examined earlier this morning.  No major events overnight of this morning.  No complaints.  Denies chest pain, dyspnea, orthopnea, PND, nausea, vomiting or abdominal pain.  Has not a bowel movement in 3 days.  Reports some improvement in lower extremity erythema but not edema.  Objective: Vitals:   05/15/19 1848 05/15/19 2050 05/15/19 2346 05/16/19 0740  BP: (!) 141/96 (!) 137/99 (!) 158/111 111/69  Pulse: (!) 111 99 (!) 109 (!) 103  Resp: 14 18 18 18   Temp:  97.8 F (36.6 C) 98.5 F (36.9 C) 98.3 F (36.8 C)  TempSrc:  Oral Oral Oral  SpO2: 100% 98% 97% 91%  Weight:  132.9 kg    Height:       No intake or output data in the 24 hours ending 05/16/19 0955 Filed Weights   05/15/19 1447 05/15/19 2050  Weight: 136.1 kg 132.9 kg    Examination:  GENERAL: No apparent distress. Nontoxic.  HEENT: MMM.  Vision and hearing grossly intact.  NECK: Supple.  No apparent JVD.  RESP:  No IWOB. Good air  movement bilaterally. CVS:  RRR. Heart sounds normal.  ABD/GI/GU: BS present. Soft. Non tender.  MSK/EXT:  Moves extremities. No apparent deformity.  1+ edema bilaterally. SKIN: Erythema over BLE.  Some increased warmth to touch over LLE.  Ruptured bulla over dorsal aspect of left foot.  See picture below for more. NEURO: Awake, alert and oriented appropriately.  No apparent focal neuro deficit. PSYCH: Calm. Normal affect.      Procedures:  None  Microbiology summarized: COVID-19 PCR negative. Influenza PCR negative.  Assessment & Plan: LLE cellulitis-started on Keflex by PCP on 4/20 without significant response.  Now with lactic acidosis and erythema.  No purulence seen.  Lower extremity Doppler negative for DVT. -Continue IV ceftriaxone -WOC consult for wound care. -Encourage leg elevation  Lactic acidosis: Likely due to the above.  Improved. -Management as above  Pulmonary edema and peripheral edema: No history of CHF.  Lower extremity DVT Doppler negative.  Seems to have some venous insufficiency.  Has no cardiopulmonary symptoms.  BNP was normal. -Follow echocardiogram -Continue IV Lasix 40 mg twice daily -Monitor fluid status, renal function and electrolytes. -Encourage leg elevation -Check ABI-May try compression stocking after ABI if normal.  Essential hypertension: Normotensive.  On losartan, Inderal and Lasix at home. -Continue home meds  Schizoaffective disorder: sees two psychiatrists - in in [place] and one in [place] depending on where he is staying.  Stable. -Continue home risperidone, Valium, gabapentin,  amantadine and Adderall.  BPH without LUTS: -Continue home Flomax.  Constipation-had some abdominal distention/discomfort on presentation.  KUB with moderate fecal loading in colon. -MiraLAX and Senokot-S twice daily as needed  At risk for sleep apnea: BMI 33.86.  Mildly elevated bicarb on BMP.  No history of OSA. -Would benefit from sleep  study  Prediabetes/hyperglycemia: A1c 6.3%. -Monitor CBG with daily BMP  Tobacco use disorder: 24-pack-year history.  Currently smokes about a pack a day. -Encourage cessation. -Nicotine patch                DVT prophylaxis: Subcu Lovenox Code Status: Full code Family Communication: Patient and/or RN. Available if any question.  Discharge barrier: Cellulitis requiring IV antibiotics and pulmonary/L LE edema requiring IV Lasix Patient is from: Home Final disposition: Likely home in the next 24 to 48 hours  Consultants:  None   Sch Meds:  Scheduled Meds: . amantadine  100 mg Oral BID  . amphetamine-dextroamphetamine  20 mg Oral TID  . atorvastatin  40 mg Oral Daily  . diazepam  10 mg Oral QID  . enoxaparin (LOVENOX) injection  40 mg Subcutaneous Q24H  . furosemide  40 mg Intravenous BID  . gabapentin  1,200 mg Oral TID  . losartan  50 mg Oral Daily  . pantoprazole  40 mg Oral Daily  . potassium chloride SA  10 mEq Oral Daily  . propranolol  20 mg Oral QID  . risperiDONE  2 mg Oral QHS  . sodium chloride flush  3 mL Intravenous Q12H  . tamsulosin  0.4 mg Oral Daily   Continuous Infusions: . sodium chloride    . cefTRIAXone (ROCEPHIN)  IV     PRN Meds:.sodium chloride, acetaminophen **OR** acetaminophen, polyethylene glycol, senna-docusate, sodium chloride flush, tiZANidine  Antimicrobials: Anti-infectives (From admission, onward)   Start     Dose/Rate Route Frequency Ordered Stop   05/16/19 1000  cefTRIAXone (ROCEPHIN) 2 g in sodium chloride 0.9 % 100 mL IVPB     2 g 200 mL/hr over 30 Minutes Intravenous Every 24 hours 05/16/19 0954     05/15/19 1800  vancomycin (VANCOREADY) IVPB 2000 mg/400 mL     2,000 mg 200 mL/hr over 120 Minutes Intravenous  Once 05/15/19 1753 05/15/19 2034   05/15/19 1700  cefTRIAXone (ROCEPHIN) 1 g in sodium chloride 0.9 % 100 mL IVPB     1 g 200 mL/hr over 30 Minutes Intravenous  Once 05/15/19 1651 05/15/19 1801       I have  personally reviewed the following labs and images: CBC: Recent Labs  Lab 05/15/19 1449  WBC 10.1  NEUTROABS 6.1  HGB 16.9  HCT 49.7  MCV 95.2  PLT 254   BMP &GFR Recent Labs  Lab 05/15/19 1449 05/16/19 0445  NA 141 140  K 3.9 4.3  CL 96* 94*  CO2 34* 36*  GLUCOSE 131* 120*  BUN 8 8  CREATININE 1.08 1.20  CALCIUM 9.3 9.3   Estimated Creatinine Clearance: 122.5 mL/min (by C-G formula based on SCr of 1.2 mg/dL). Liver & Pancreas: Recent Labs  Lab 05/15/19 1449  AST 21  ALT 32  ALKPHOS 120  BILITOT 0.6  PROT 7.0  ALBUMIN 3.8   No results for input(s): LIPASE, AMYLASE in the last 168 hours. No results for input(s): AMMONIA in the last 168 hours. Diabetic: Recent Labs    05/15/19 1847  HGBA1C 6.3*   No results for input(s): GLUCAP in the last 168 hours. Cardiac Enzymes: No  results for input(s): CKTOTAL, CKMB, CKMBINDEX, TROPONINI in the last 168 hours. No results for input(s): PROBNP in the last 8760 hours. Coagulation Profile: No results for input(s): INR, PROTIME in the last 168 hours. Thyroid Function Tests: No results for input(s): TSH, T4TOTAL, FREET4, T3FREE, THYROIDAB in the last 72 hours. Lipid Profile: No results for input(s): CHOL, HDL, LDLCALC, TRIG, CHOLHDL, LDLDIRECT in the last 72 hours. Anemia Panel: No results for input(s): VITAMINB12, FOLATE, FERRITIN, TIBC, IRON, RETICCTPCT in the last 72 hours. Urine analysis:    Component Value Date/Time   COLORURINE YELLOW (A) 04/07/2018 1839   APPEARANCEUR CLEAR (A) 04/07/2018 1839   APPEARANCEUR Clear 02/04/2013 0115   LABSPEC 1.009 04/07/2018 1839   LABSPEC 1.027 02/04/2013 0115   PHURINE 7.0 04/07/2018 1839   GLUCOSEU NEGATIVE 04/07/2018 1839   GLUCOSEU Negative 02/04/2013 0115   HGBUR LARGE (A) 04/07/2018 1839   BILIRUBINUR negative 12/28/2018 1530   BILIRUBINUR Negative 02/04/2013 0115   KETONESUR NEGATIVE 04/07/2018 1839   PROTEINUR Negative 12/28/2018 1530   PROTEINUR NEGATIVE 04/07/2018  1839   UROBILINOGEN 0.2 12/28/2018 1530   UROBILINOGEN 1.0 04/27/2012 0856   NITRITE negative 12/28/2018 1530   NITRITE NEGATIVE 04/07/2018 1839   LEUKOCYTESUR Negative 12/28/2018 1530   LEUKOCYTESUR TRACE (A) 04/07/2018 1839   LEUKOCYTESUR Negative 02/04/2013 0115   Sepsis Labs: Invalid input(s): PROCALCITONIN, [place]  Microbiology: Recent Results (from the past 240 hour(s))  Respiratory Panel by RT PCR (Flu A&B, Covid) - Nasopharyngeal Swab     Status: None   Collection Time: 05/15/19  5:07 PM   Specimen: Nasopharyngeal Swab  Result Value Ref Range Status   SARS Coronavirus 2 by RT PCR NEGATIVE NEGATIVE Final    Comment: (NOTE) SARS-CoV-2 target nucleic acids are NOT DETECTED. The SARS-CoV-2 RNA is generally detectable in upper respiratoy specimens during the acute phase of infection. The lowest concentration of SARS-CoV-2 viral copies this assay can detect is 131 copies/mL. A negative result does not preclude SARS-Cov-2 infection and should not be used as the sole basis for treatment or other patient management decisions. A negative result may occur with  improper specimen collection/handling, submission of specimen other than nasopharyngeal swab, presence of viral mutation(s) within the areas targeted by this assay, and inadequate number of viral copies (<131 copies/mL). A negative result must be combined with clinical observations, patient history, and epidemiological information. The expected result is Negative. Fact Sheet for Patients:  PinkCheek.be Fact Sheet for Healthcare Providers:  GravelBags.it This test is not yet ap proved or cleared by the [place] FDA and  has been authorized for detection and/or diagnosis of SARS-CoV-2 by FDA under an Emergency Use Authorization (EUA). This EUA will remain  in effect (meaning this test can be used) for the duration of the COVID-19 declaration under Section  564(b)(1) of the Act, 21 U.S.C. section 360bbb-3(b)(1), unless the authorization is terminated or revoked sooner.    Influenza A by PCR NEGATIVE NEGATIVE Final   Influenza B by PCR NEGATIVE NEGATIVE Final    Comment: (NOTE) The Xpert Xpress SARS-CoV-2/FLU/RSV assay is intended as an aid in  the diagnosis of influenza from Nasopharyngeal swab specimens and  should not be used as a sole basis for treatment. Nasal washings and  aspirates are unacceptable for Xpert Xpress SARS-CoV-2/FLU/RSV  testing. Fact Sheet for Patients: PinkCheek.be Fact Sheet for Healthcare Providers: GravelBags.it This test is not yet approved or cleared by the [place] FDA and  has been authorized for detection and/or diagnosis of SARS-CoV-2  by  FDA under an Emergency Use Authorization (EUA). This EUA will remain  in effect (meaning this test can be used) for the duration of the  Covid-19 declaration under Section 564(b)(1) of the Act, 21  U.S.C. section 360bbb-3(b)(1), unless the authorization is  terminated or revoked. Performed at [facility], [address], [place], [place] [address]     Radiology Studies: DG Chest 2 View  Result Date: 05/15/2019 CLINICAL DATA:  Leg swelling, history of DVT EXAM: CHEST - 2 VIEW COMPARISON:  04/03/2018 FINDINGS: The heart size and mediastinal contours are within normal limits. Mild, diffuse interstitial pulmonary opacity. The visualized skeletal structures are unremarkable. IMPRESSION: Mild, diffuse interstitial pulmonary opacity, consistent with edema or infection. Electronically Signed   By: Lauralyn Primes M.D.   On: 05/15/2019 16:44   US Venous Img Lower Bilateral  Result Date: 05/15/2019 CLINICAL DATA:  44 year old male with bilateral lower extremity swelling and redness. EXAM: Bilateral LOWER EXTREMITY VENOUS DOPPLER ULTRASOUND TECHNIQUE: Gray-scale sonography with compression, as well as color and  duplex ultrasound, were performed to evaluate the deep venous system(s) from the level of the common femoral vein through the popliteal and proximal calf veins. COMPARISON:  None. FINDINGS: Evaluation is limited due to body habitus and suboptimal visualization of the vessels. VENOUS Normal compressibility of the common femoral, superficial femoral, and popliteal veins, as well as the visualized calf veins. Visualized portions of profunda femoral vein and great saphenous vein unremarkable. No filling defects to suggest DVT on grayscale or color Doppler imaging. Doppler waveforms show normal direction of venous flow, normal respiratory phasicity and response to augmentation. OTHER There is diffuse subcutaneous edema of the calf. Limitations: The calf veins are poorly visualized due to edema. IMPRESSION: No femoropopliteal DVT nor evidence of DVT within the visualized calf veins. If clinical symptoms are inconsistent or if there are persistent or worsening symptoms, further imaging (possibly involving the iliac veins) may be warranted. Electronically Signed   By: Elgie Collard M.D.   On: 05/15/2019 16:24   DG Abd 2 Views  Result Date: 05/15/2019 CLINICAL DATA:  Abdominal distension. EXAM: ABDOMEN - 2 VIEW COMPARISON:  Chest x-ray May 15, 2019 FINDINGS: There appears to be effusion and opacity in the lateral left lung base. The right lung base is normal. No free air, portal venous gas, or pneumatosis. No renal or ureteral stones identified. Moderate fecal loading in the colon. No evidence of bowel obstruction identified. IMPRESSION: Moderate fecal loading in the colon. Possible effusion and opacity in the lateral left lung base. Electronically Signed   By: Gerome Sam III M.D   On: 05/15/2019 19:52      Melvia Matousek T. Atley Neubert [facility]  If 7PM-7AM, please contact night-coverage [email or web address] Password [email or web address] 05/16/2019, 9:55 AM

## 2019-05-16 NOTE — Consult Note (Signed)
WOC Nurse wound consult note Consultation was completed by review of records, images and assistance from the bedside nurse/clinical staff.    Reason for Consult: blisters Wound type: cellulitis LLE; with partial thickness skin loss on the dorsal foot from edema and infectious process  Pressure Injury POA: NA Measurement: see nursing flow sheet Wound bed: clean, pink, partial thickness skin loss Drainage (amount, consistency, odor) unable to assess no dressing in image Periwound: edema, erythema noted pretibial with some mild small areas of blistering, intact blisters with serous fluid Weak distal pulses per nursing flow sheet and MD notes  Noted RLE also with some discoloration; ? Hemosiderin staining with the possibility of venous stasis MD has ordered ABIs; would need results to consider compression safety . I do not think they perform ABIs on this campus; would need to be outpatient work up Dressing procedure/placement/frequency: Single layer non adherent; top with dry dressing.  Elevate legs Consider outpatient work up for venous insufficiency and compression therapy    Re consult if needed, will not follow at this time. Thanks  Rosaelena Kemnitz [employer], RN,CWOCN, CNS, CWON-AP [phone])

## 2019-05-16 NOTE — Progress Notes (Signed)
*  PRELIMINARY RESULTS* Echocardiogram 2D Echocardiogram has been performed.  Jeffrey Lin Grady Mohabir 05/16/2019, 10:36 AM

## 2019-05-16 NOTE — Progress Notes (Signed)
BP 96/52 at 1627.  Lasix held. Valium and inderal 1800 dose will be held.  Dr. Alanda Slim made aware.  No new orders.

## 2019-05-17 ENCOUNTER — Telehealth: Payer: Self-pay

## 2019-05-17 LAB — RENAL FUNCTION PANEL
Albumin: 3.3 g/dL — ABNORMAL LOW (ref 3.5–5.0)
Anion gap: 12 (ref 5–15)
BUN: 15 mg/dL (ref 6–20)
CO2: 31 mmol/L (ref 22–32)
Calcium: 8.8 mg/dL — ABNORMAL LOW (ref 8.9–10.3)
Chloride: 95 mmol/L — ABNORMAL LOW (ref 98–111)
Creatinine, Ser: 1.05 mg/dL (ref 0.61–1.24)
GFR calc Af Amer: >60 mL/min (ref >60)
GFR calc non Af Amer: >60 mL/min (ref >60)
Glucose, Bld: 101 mg/dL — ABNORMAL HIGH (ref 70–99)
Phosphorus: 3.9 mg/dL (ref 2.5–4.6)
Potassium: 4.1 mmol/L (ref 3.5–5.1)
Sodium: 138 mmol/L (ref 135–145)

## 2019-05-17 LAB — CBC
HCT: 47.7 % (ref 39.0–52.0)
Hemoglobin: 15.8 g/dL (ref 13.0–17.0)
MCH: 31.9 pg (ref 26.0–34.0)
MCHC: 33.1 g/dL (ref 30.0–36.0)
MCV: 96.4 fL (ref 80.0–100.0)
Platelets: 207 10*3/uL (ref 150–400)
RBC: 4.95 MIL/uL (ref 4.22–5.81)
RDW: 14.1 % (ref 11.5–15.5)
WBC: 11.6 10*3/uL — ABNORMAL HIGH (ref 4.0–10.5)
nRBC: 0 % (ref 0.0–0.2)

## 2019-05-17 LAB — MAGNESIUM: Magnesium: 1.7 mg/dL (ref 1.7–2.4)

## 2019-05-17 MED ORDER — MAGNESIUM CITRATE PO SOLN
1.0000 | Freq: Once | ORAL | Status: DC
  Filled 2019-05-17: qty 296

## 2019-05-17 MED ORDER — POLYETHYLENE GLYCOL 3350 17 GM/SCOOP PO POWD
17.0000 g | Freq: Two times a day (BID) | ORAL | 0 refills | Status: DC | PRN
Start: 2019-05-17 — End: 2020-03-24

## 2019-05-17 MED ORDER — TAMSULOSIN HCL 0.4 MG PO CAPS: 0.4000 mg | ORAL_CAPSULE | Freq: Every day | ORAL | 1 refills | Status: DC

## 2019-05-17 MED ORDER — SENNOSIDES-DOCUSATE SODIUM 8.6-50 MG PO TABS: 1.0000 | ORAL_TABLET | Freq: Two times a day (BID) | ORAL | 1 refills | Status: DC | PRN

## 2019-05-17 NOTE — Telephone Encounter (Signed)
Transition Care Management Follow-up Telephone Call  Date of discharge and from where: 05/17/2019, [facility]  How have you been since you were released from the hospital? Patient states that he is doing very well since getting home from the hospital.   Any questions or concerns? No   Items Reviewed:  Did the pt receive and understand the discharge instructions provided? Yes   Medications obtained and verified? Yes   Any new allergies since your discharge? No   Dietary orders reviewed? Yes  Do you have support at home? Yes   Functional Questionnaire: (I = Independent and D = Dependent) ADLs: I  Bathing/Dressing- I  Meal Prep- I  Eating- I  Maintaining continence- I  Transferring/Ambulation- I  Managing Meds- I  Follow up appointments reviewed:   PCP Hospital f/u appt confirmed? Yes  Scheduled to see Dr. Ermalene Searing on 05/20/2019 @ 2:40 pm.  Specialist Hospital f/u appt confirmed? N/A   Are transportation arrangements needed? No   If their condition worsens, is the pt aware to call PCP or go to the Emergency Dept.? Yes  Was the patient provided with contact information for the PCP's office or ED? Yes  Was to pt encouraged to call back with questions or concerns? Yes

## 2019-05-17 NOTE — Progress Notes (Signed)
Discharge instructions reviewed with patient and his mother. They verbalized understanding. IV removed. Patient sent with extra dressings and TED hose (he did not want me to apply them stated he could do it when he got home). Patient is now waiting on volunteers to wheel him out.

## 2019-05-17 NOTE — Discharge Summary (Signed)
Physician Discharge Summary  Jeffrey Lin EVO:[id number] DOB: 1975/04/15 DOA: 05/15/2019  PCP: Excell Seltzer, MD  Admit date: 05/15/2019 Discharge date: 05/17/2019  Admitted From: Home Disposition: Home  Recommendations for Outpatient Follow-up:  1. Follow ups as below. 2. Please obtain CBC/BMP/Mag at follow up 3. Please follow up on the following pending results: None  Home Health: None Equipment/Devices: None  Discharge Condition: Stable CODE STATUS: Full code  Follow-up Information    Excell Seltzer, MD. Schedule an appointment as soon as possible for a visit on 05/20/2019.   Specialty: Family Medicine Why: @ 2:40 pm Contact information: [address] [place] [address] [phone]           Hospital Course: 44 year old male with history of HTN, edema, schizoaffective disorder, obesity and tobacco use disorder presenting with lower extremity cellulitis, edema and and a skin tear over left foot.  Has been on Lasix for lower extremity edema without significant response.  He was a started on Keflex for cellulitis by PCP on 4/20 without significant response.  In ED, hemodynamically stable.  Afebrile.  CBC unremarkable.  CMP with mild alkalosis with bicarb to 34.  Lactic acid 3.1> 2.0.  Pro-Cal negative.  BNP within normal.  COVID-19 influenza PCR negative.  CXR with some vascular congestion.  Lower extremity venous Doppler negative for DVT.  Started on vancomycin and ceftriaxone.  Admitted for lower extremity cellulitis/edema on IV ceftriaxone and IV Lasix.  Echocardiogram without significant finding.  Venous ultrasound of bilateral lower extremity negative for DVT.  On the day of discharge, cellulitis and edema improved.  Felt well and ready to go home.  He does discharged on home Keflex to complete course.  He was encouraged to elevate his legs and wear compression socks.  He will follow up with PCP either later this week or early next week.  Assessment and  plan discussed with patient's mother at bedside on the day of discharge.  See individual problem list below for more hospital course.  Discharge Diagnoses:  LLE cellulitis-started on Keflex by PCP on 4/20 without significant response.    Lactic acidosis resolved.  Erythema and swelling improved.   BLE venous ultrasound negative for DVT.  Good DP pulses bilaterally. -IV ceftriaxone for 2 days.  Discharged on home Keflex to complete course. -Encouraged to elevate legs and wear compression hose. -Encouraged follow-up with PCP either later this week or early next week.  Lactic acidosis: Likely due to the above.  Resolved.  Peripheral edema/acute pulmonary vascular congestion: No history of CHF.  BNP and echo within normal.   Albumin 3.3.  Not anemic.  TSH within normal.  Lower extremity DVT Doppler negative.  Seems to have some venous insufficiency.  Has no cardiopulmonary symptoms.  -Discharged on home Lasix 40 mg daily.  Advised to take additional dose if more swelling. -Neck elevation and compression socks as above.  Essential hypertension: Normotensive.   -Discharged on home medications.  Schizoaffective disorder: sees two psychiatrists - in in [place] and one in [place] depending on where he is staying.  Stable. -Continue home risperidone, Valium, gabapentin, amantadine and Adderall.  BPH with occasional LUTS: -Started on Flomax this admission.  Constipation-had some abdominal distention/discomfort on presentation.  KUB with moderate fecal loading in colon.  Had a bowel movement prior to discharge. -MiraLAX and Senokot-S twice daily as needed  At risk for sleep apnea: BMI 33.86.  Mildly elevated bicarb on BMP.  No history of OSA. -Would benefit  from sleep study  Prediabetes/hyperglycemia: A1c 6.3%.  Tobacco use disorder: 24-pack-year history.  Currently smokes about a pack a day. -Encouraged cessation and provided with resources.  Skin wound over RLE -Encouraged to keep  wound clean and dry               Discharge Instructions  Discharge Instructions    Call MD for:  difficulty breathing, headache or visual disturbances   Complete by: As directed    Call MD for:  extreme fatigue   Complete by: As directed    Call MD for:  persistant nausea and vomiting   Complete by: As directed    Call MD for:  redness, tenderness, or signs of infection (pain, swelling, redness, odor or green/yellow discharge around incision site)   Complete by: As directed    Call MD for:  severe uncontrolled pain   Complete by: As directed    Call MD for:  temperature >100.4   Complete by: As directed    Diet - low sodium heart healthy   Complete by: As directed    Discharge instructions   Complete by: As directed    It has been a pleasure taking care of you! You were hospitalized for leg infection (cellulitis and leg swelling). You were treated with antibiotic and fluid medications. We are discharging you on you home antibiotics and lasix. You may take additional dose of lasix 40 mg in the afternoon if you notice more swelling. We recommend elevating your leg and wearing compression socks.  Please review your new medication list and the directions before you take your medications.  Please think about quitting smoking.  This is very important for your health.  You may talk to your primary care doctor. You can also call 1-800-QUIT-NOW [phone]) for free smoking cessation counseling.  Take care,   Increase activity slowly   Complete by: As directed      Allergies as of 05/17/2019      Reactions   Toradol [ketorolac Tromethamine] Swelling   Lamotrigine Rash      Medication List    TAKE these medications   ALPRAZolam 1 MG tablet Commonly known as: XANAX TAKE 1 TABLET 4 TIMES A DAY AS NEEDED *TBF 9/28, 10/28, 11/27, 12/27, 1/26, 2/25*STOP VALIUM* Notes to patient: Not given in hospital   amantadine 100 MG capsule Commonly known as: SYMMETREL Take 100 mg by  mouth 2 (two) times daily. Notes to patient: Last dose given today at 8:35 AM   amphetamine-dextroamphetamine 20 MG tablet Commonly known as: ADDERALL Take 1 tablet by mouth in the morning, at noon, in the evening, and at bedtime. Notes to patient: Last dose given today at 8:34 AM   atorvastatin 40 MG tablet Commonly known as: LIPITOR TAKE 1 TABLET BY MOUTH EVERY DAY Notes to patient: Last dose given today at 8:35 AM   cephALEXin 500 MG capsule Commonly known as: KEFLEX Take 1 capsule (500 mg total) by mouth 3 (three) times daily. Notes to patient: Not given in hospital   diazepam 10 MG tablet Commonly known as: VALIUM Take 10 mg by mouth 4 (four) times daily. Notes to patient: Last dose given today at 8:35 AM   furosemide 20 MG tablet Commonly known as: LASIX TAKE 2 TABLETS BY MOUTH EVERY DAY Notes to patient: Last dose given today at 8:34 AM   gabapentin 600 MG tablet Commonly known as: NEURONTIN Take 1,200 mg by mouth 3 (three) times daily. Notes to patient: Last dose given  today at 8:35 AM   losartan 100 MG tablet Commonly known as: COZAAR Take 0.5 tablets (50 mg total) by mouth daily. Notes to patient: Last dose given today at 8:35 AM   omeprazole 20 MG capsule Commonly known as: PRILOSEC Take 20 mg by mouth daily as needed. Notes to patient: Not given in hospital   polyethylene glycol powder 17 GM/SCOOP powder Commonly known as: MiraLax Take 17 g by mouth 2 (two) times daily as needed for moderate constipation. Notes to patient: Last dose given today at 8:34 AM   potassium chloride 10 MEQ tablet Commonly known as: KLOR-CON TAKE 1 TABLET BY MOUTH EVERY DAY Notes to patient: Last dose given today at 8:36 AM   propranolol 20 MG tablet Commonly known as: INDERAL Take 20 mg by mouth 4 (four) times daily. Notes to patient: Last dose given today at 8:35 AM   risperiDONE 2 MG tablet Commonly known as: RISPERDAL Take 2 mg by mouth at bedtime. Notes to  patient: Last dose given yesterday at 10:13 PM   senna-docusate 8.6-50 MG tablet Commonly known as: Senokot-S Take 1 tablet by mouth 2 (two) times daily as needed for moderate constipation. Notes to patient: Not given in hospital   tamsulosin 0.4 MG Caps capsule Commonly known as: FLOMAX Take 1 capsule (0.4 mg total) by mouth daily. Notes to patient: Last dose given today at 8:36 AM   tizanidine 2 MG capsule Commonly known as: ZANAFLEX TAKE 1 CAPSULE BY MOUTH 3 TIMES DAILY AS NEEDED FOR MUSCLE SPASMS. Notes to patient: Not given in hospital   Vitamin D (Ergocalciferol) 1.25 MG (50000 UNIT) Caps capsule Commonly known as: DRISDOL Take 1 capsule (50,000 Units total) by mouth 2 (two) times a week. Notes to patient: Not given in hospital       Consultations:  None  Procedures/Studies:  2D Echo on 05/16/2019 1. Left ventricular ejection fraction, by estimation, is 60 to 65%. The  left ventricle has normal function. The left ventricle has no regional  wall motion abnormalities. Left ventricular diastolic parameters were  normal.  2. Right ventricular systolic function is moderately reduced. The right  ventricular size is moderately enlarged.  3. Right atrial size was moderately dilated.  4. The mitral valve is normal in structure. No evidence of mitral valve  regurgitation. No evidence of mitral stenosis.  5. The aortic valve is tricuspid. Aortic valve regurgitation is not  visualized. No aortic stenosis is present.  6. The inferior vena cava is normal in size with greater than 50%  respiratory variability, suggesting right atrial pressure of 3 mmHg.    X-ray chest PA and lateral  Result Date: 05/16/2019 CLINICAL DATA:  Swollen lower extremities. EXAM: CHEST - 2 VIEW COMPARISON:  May 15, 2019 FINDINGS: No pneumothorax. Stable cardiomediastinal silhouette. Increasing infiltrate in the lingula. Skin fold over the left chest. No other interval changes. IMPRESSION: 1.  Increasing infiltrate in the lingula worrisome for pneumonia for aspiration. No other interval changes. Electronically Signed   By: Dorise Bullion III M.D   On: 05/16/2019 13:45   DG Chest 2 View  Result Date: 05/15/2019 CLINICAL DATA:  Leg swelling, history of DVT EXAM: CHEST - 2 VIEW COMPARISON:  04/03/2018 FINDINGS: The heart size and mediastinal contours are within normal limits. Mild, diffuse interstitial pulmonary opacity. The visualized skeletal structures are unremarkable. IMPRESSION: Mild, diffuse interstitial pulmonary opacity, consistent with edema or infection. Electronically Signed   By: Eddie Candle M.D.   On: 05/15/2019 16:44  US Venous Img Lower Bilateral  Result Date: 05/15/2019 CLINICAL DATA:  44 year old male with bilateral lower extremity swelling and redness. EXAM: Bilateral LOWER EXTREMITY VENOUS DOPPLER ULTRASOUND TECHNIQUE: Gray-scale sonography with compression, as well as color and duplex ultrasound, were performed to evaluate the deep venous system(s) from the level of the common femoral vein through the popliteal and proximal calf veins. COMPARISON:  None. FINDINGS: Evaluation is limited due to body habitus and suboptimal visualization of the vessels. VENOUS Normal compressibility of the common femoral, superficial femoral, and popliteal veins, as well as the visualized calf veins. Visualized portions of profunda femoral vein and great saphenous vein unremarkable. No filling defects to suggest DVT on grayscale or color Doppler imaging. Doppler waveforms show normal direction of venous flow, normal respiratory phasicity and response to augmentation. OTHER There is diffuse subcutaneous edema of the calf. Limitations: The calf veins are poorly visualized due to edema. IMPRESSION: No femoropopliteal DVT nor evidence of DVT within the visualized calf veins. If clinical symptoms are inconsistent or if there are persistent or worsening symptoms, further imaging (possibly involving the  iliac veins) may be warranted. Electronically Signed   By: Elgie Collard M.D.   On: 05/15/2019 16:24   DG Abd 2 Views  Result Date: 05/15/2019 CLINICAL DATA:  Abdominal distension. EXAM: ABDOMEN - 2 VIEW COMPARISON:  Chest x-ray May 15, 2019 FINDINGS: There appears to be effusion and opacity in the lateral left lung base. The right lung base is normal. No free air, portal venous gas, or pneumatosis. No renal or ureteral stones identified. Moderate fecal loading in the colon. No evidence of bowel obstruction identified. IMPRESSION: Moderate fecal loading in the colon. Possible effusion and opacity in the lateral left lung base. Electronically Signed   By: Gerome Sam III M.D   On: 05/15/2019 19:52   ECHOCARDIOGRAM COMPLETE  Result Date: 05/16/2019    ECHOCARDIOGRAM REPORT   Patient Name:   SHALIN VONBARGEN Date of Exam: 05/16/2019 Medical Rec #:  [id number]       Height:       78.0 in Accession #:    [id number]      Weight:       293.0 lb Date of Birth:  1975/11/10        BSA:          2.655 m Patient Age:    43 years        BP:           111/69 mmHg Patient Gender: M               HR:           104 bpm. Exam Location:  [facility] Procedure: 2D Echo Indications:     Pulmonary Edema  History:         Patient has prior history of Echocardiogram examinations, most                  recent 03/19/2016.  Sonographer:     Wonda Cerise RDCS Referring Phys:  [id number] MICHAEL E NORINS Diagnosing Phys: Charlton Haws MD  Sonographer Comments: Technically challenging study due to limited acoustic windows, Technically difficult study due to poor echo windows, no subcostal window and patient is morbidly obese. Image acquisition challenging due to patient body habitus. IMPRESSIONS  1. Left ventricular ejection fraction, by estimation, is 60 to 65%. The left ventricle has normal function. The left ventricle has no regional wall motion abnormalities. Left ventricular diastolic parameters were normal.  2. Right ventricular systolic  function is moderately reduced. The right ventricular size is moderately enlarged.  3. Right atrial size was moderately dilated.  4. The mitral valve is normal in structure. No evidence of mitral valve regurgitation. No evidence of mitral stenosis.  5. The aortic valve is tricuspid. Aortic valve regurgitation is not visualized. No aortic stenosis is present.  6. The inferior vena cava is normal in size with greater than 50% respiratory variability, suggesting right atrial pressure of 3 mmHg. FINDINGS  Left Ventricle: Left ventricular ejection fraction, by estimation, is 60 to 65%. The left ventricle has normal function. The left ventricle has no regional wall motion abnormalities. The left ventricular internal cavity size was normal in size. There is  no left ventricular hypertrophy. Left ventricular diastolic parameters were normal. Right Ventricle: The right ventricular size is moderately enlarged. Right vetricular wall thickness was not assessed. Right ventricular systolic function is moderately reduced. Left Atrium: Left atrial size was normal in size. Right Atrium: Right atrial size was moderately dilated. Pericardium: There is no evidence of pericardial effusion. Mitral Valve: The mitral valve is normal in structure. Normal mobility of the mitral valve leaflets. No evidence of mitral valve regurgitation. No evidence of mitral valve stenosis. Tricuspid Valve: The tricuspid valve is normal in structure. Tricuspid valve regurgitation is mild . No evidence of tricuspid stenosis. Aortic Valve: The aortic valve is tricuspid. Aortic valve regurgitation is not visualized. No aortic stenosis is present. Aortic valve peak gradient measures 4.4 mmHg. Pulmonic Valve: The pulmonic valve was normal in structure. Pulmonic valve regurgitation is not visualized. No evidence of pulmonic stenosis. Aorta: The aortic root is normal in size and structure. Venous: The inferior vena cava was not well visualized. The inferior vena  cava is normal in size with greater than 50% respiratory variability, suggesting right atrial pressure of 3 mmHg. IAS/Shunts: The interatrial septum was not well visualized.  LEFT VENTRICLE PLAX 2D LVIDd:         4.30 cm  Diastology LVIDs:         3.08 cm  LV e' lateral:   14.80 cm/s LV PW:         1.30 cm  LV E/e' lateral: 3.7 LV IVS:        1.14 cm  LV e' medial:    8.70 cm/s LVOT diam:     2.00 cm  LV E/e' medial:  6.4 LV SV:         27 LV SV Index:   10 LVOT Area:     3.14 cm  RIGHT VENTRICLE RV Basal diam:  4.45 cm RV S prime:     19.90 cm/s TAPSE (M-mode): 2.4 cm LEFT ATRIUM           Index      RIGHT ATRIUM           Index LA diam:      3.00 cm 1.13 cm/m RA Area:     11.90 cm LA Vol (A4C): 16.3 ml 6.14 ml/m RA Volume:   32.10 ml  12.09 ml/m  AORTIC VALVE AV Area (Vmax): 1.44 cm AV Vmax:        105.00 cm/s AV Peak Grad:   4.4 mmHg LVOT Vmax:      48.00 cm/s LVOT Vmean:     30.500 cm/s LVOT VTI:       0.085 m  AORTA Ao Root diam: 3.00 cm Ao Asc diam:  2.70 cm MITRAL VALVE MV Area (PHT): 3.03  cm    SHUNTS MV Decel Time: 250 msec    Systemic VTI:  0.09 m MV E velocity: 55.30 cm/s  Systemic Diam: 2.00 cm MV A velocity: 38.80 cm/s MV E/A ratio:  1.43 Charlton Haws MD Electronically signed by Charlton Haws MD Signature Date/Time: 05/16/2019/1:16:03 PM    Final        Discharge Exam: Vitals:   05/16/19 2059 05/16/19 2323  BP: 134/83 103/82  Pulse:  91  Resp:  18  Temp:  (!) 97.4 F (36.3 C)  SpO2:  93%    GENERAL: No acute distress.  Appears well.  HEENT: MMM.  Vision and hearing grossly intact.  NECK: Supple.  No apparent JVD.  RESP:  No IWOB. Good air movement bilaterally. CVS:  RRR. Heart sounds normal.  ABD/GI/GU: Bowel sounds present. Soft. Non tender.  MSK/EXT:  Moves extremities. No apparent deformity.  Trace edema bilaterally. SKIN: Skin wound over RLE.  Dressing DCI. NEURO: Awake, alert and oriented appropriately.  No apparent focal neuro deficit. PSYCH: Calm. Normal  affect.   The results of significant diagnostics from this hospitalization (including imaging, microbiology, ancillary and laboratory) are listed below for reference.     Microbiology: Recent Results (from the past 240 hour(s))  Respiratory Panel by RT PCR (Flu A&B, Covid) - Nasopharyngeal Swab     Status: None   Collection Time: 05/15/19  5:07 PM   Specimen: Nasopharyngeal Swab  Result Value Ref Range Status   SARS Coronavirus 2 by RT PCR NEGATIVE NEGATIVE Final    Comment: (NOTE) SARS-CoV-2 target nucleic acids are NOT DETECTED. The SARS-CoV-2 RNA is generally detectable in upper respiratoy specimens during the acute phase of infection. The lowest concentration of SARS-CoV-2 viral copies this assay can detect is 131 copies/mL. A negative result does not preclude SARS-Cov-2 infection and should not be used as the sole basis for treatment or other patient management decisions. A negative result may occur with  improper specimen collection/handling, submission of specimen other than nasopharyngeal swab, presence of viral mutation(s) within the areas targeted by this assay, and inadequate number of viral copies (<131 copies/mL). A negative result must be combined with clinical observations, patient history, and epidemiological information. The expected result is Negative. Fact Sheet for Patients:  https://www.moore.com/ Fact Sheet for Healthcare Providers:  https://www.young.biz/ This test is not yet ap proved or cleared by the [place] FDA and  has been authorized for detection and/or diagnosis of SARS-CoV-2 by FDA under an Emergency Use Authorization (EUA). This EUA will remain  in effect (meaning this test can be used) for the duration of the COVID-19 declaration under Section 564(b)(1) of the Act, 21 U.S.C. section 360bbb-3(b)(1), unless the authorization is terminated or revoked sooner.    Influenza A by PCR NEGATIVE NEGATIVE Final    Influenza B by PCR NEGATIVE NEGATIVE Final    Comment: (NOTE) The Xpert Xpress SARS-CoV-2/FLU/RSV assay is intended as an aid in  the diagnosis of influenza from Nasopharyngeal swab specimens and  should not be used as a sole basis for treatment. Nasal washings and  aspirates are unacceptable for Xpert Xpress SARS-CoV-2/FLU/RSV  testing. Fact Sheet for Patients: https://www.moore.com/ Fact Sheet for Healthcare Providers: https://www.young.biz/ This test is not yet approved or cleared by the [place] FDA and  has been authorized for detection and/or diagnosis of SARS-CoV-2 by  FDA under an Emergency Use Authorization (EUA). This EUA will remain  in effect (meaning this test can be used) for the duration of the  Covid-19 declaration under Section 564(b)(1) of the Act, 21  U.S.C. section 360bbb-3(b)(1), unless the authorization is  terminated or revoked. Performed at [facility], [address] [place], [place] [id number]      Labs: BNP (last 3 results) Recent Labs    05/07/19 1714 05/15/19 1449  BNP 16 30.0   Basic Metabolic Panel: Recent Labs  Lab 05/15/19 1449 05/16/19 0445 05/17/19 0554  NA 141 140 138  K 3.9 4.3 4.1  CL 96* 94* 95*  CO2 34* 36* 31  GLUCOSE 131* 120* 101*  BUN 8 8 15   CREATININE 1.08 1.20 1.05  CALCIUM 9.3 9.3 8.8*  MG  --   --  1.7  PHOS  --   --  3.9   Liver Function Tests: Recent Labs  Lab 05/15/19 1449 05/17/19 0554  AST 21  --   ALT 32  --   ALKPHOS 120  --   BILITOT 0.6  --   PROT 7.0  --   ALBUMIN 3.8 3.3*   No results for input(s): LIPASE, AMYLASE in the last 168 hours. No results for input(s): AMMONIA in the last 168 hours. CBC: Recent Labs  Lab 05/15/19 1449 05/17/19 0554  WBC 10.1 11.6*  NEUTROABS 6.1  --   HGB 16.9 15.8  HCT 49.7 47.7  MCV 95.2 96.4  PLT 254 207   Cardiac Enzymes: No results for input(s): CKTOTAL, CKMB, CKMBINDEX, TROPONINI in the last 168  hours. BNP: Invalid input(s): POCBNP CBG: No results for input(s): GLUCAP in the last 168 hours. D-Dimer No results for input(s): DDIMER in the last 72 hours. Hgb A1c Recent Labs    05/15/19 1847  HGBA1C 6.3*   Lipid Profile No results for input(s): CHOL, HDL, LDLCALC, TRIG, CHOLHDL, LDLDIRECT in the last 72 hours. Thyroid function studies No results for input(s): TSH, T4TOTAL, T3FREE, THYROIDAB in the last 72 hours.  Invalid input(s): FREET3 Anemia work up No results for input(s): VITAMINB12, FOLATE, FERRITIN, TIBC, IRON, RETICCTPCT in the last 72 hours. Urinalysis    Component Value Date/Time   COLORURINE YELLOW (A) 04/07/2018 1839   APPEARANCEUR CLEAR (A) 04/07/2018 1839   APPEARANCEUR Clear 02/04/2013 0115   LABSPEC 1.009 04/07/2018 1839   LABSPEC 1.027 02/04/2013 0115   PHURINE 7.0 04/07/2018 1839   GLUCOSEU NEGATIVE 04/07/2018 1839   GLUCOSEU Negative 02/04/2013 0115   HGBUR LARGE (A) 04/07/2018 1839   BILIRUBINUR negative 12/28/2018 1530   BILIRUBINUR Negative 02/04/2013 0115   KETONESUR NEGATIVE 04/07/2018 1839   PROTEINUR Negative 12/28/2018 1530   PROTEINUR NEGATIVE 04/07/2018 1839   UROBILINOGEN 0.2 12/28/2018 1530   UROBILINOGEN 1.0 04/27/2012 0856   NITRITE negative 12/28/2018 1530   NITRITE NEGATIVE 04/07/2018 1839   LEUKOCYTESUR Negative 12/28/2018 1530   LEUKOCYTESUR TRACE (A) 04/07/2018 1839   LEUKOCYTESUR Negative 02/04/2013 0115   Sepsis Labs Invalid input(s): PROCALCITONIN,  WBC,  LACTICIDVEN   Time coordinating discharge: 35 minutes  SIGNED:  Almon Herculesaye T Dezmond Downie, MD  [facility] 05/17/2019, 7:15 PM  If 7PM-7AM, please contact night-coverage [email or web address] Password [email or web address]

## 2019-05-20 ENCOUNTER — Encounter: Payer: Self-pay | Admitting: Family Medicine

## 2019-05-20 ENCOUNTER — Other Ambulatory Visit: Payer: Self-pay

## 2019-05-20 ENCOUNTER — Ambulatory Visit (INDEPENDENT_AMBULATORY_CARE_PROVIDER_SITE_OTHER): Payer: PPO | Admitting: Family Medicine

## 2019-05-20 VITALS — BP 146/78 | HR 126 | Temp 97.8°F | Ht 76.5 in | Wt 296.8 lb

## 2019-05-20 DIAGNOSIS — R0683 Snoring: Secondary | ICD-10-CM

## 2019-05-20 DIAGNOSIS — R609 Edema, unspecified: Secondary | ICD-10-CM

## 2019-05-20 DIAGNOSIS — N401 Enlarged prostate with lower urinary tract symptoms: Secondary | ICD-10-CM

## 2019-05-20 DIAGNOSIS — R39198 Other difficulties with micturition: Secondary | ICD-10-CM | POA: Diagnosis not present

## 2019-05-20 DIAGNOSIS — L03116 Cellulitis of left lower limb: Secondary | ICD-10-CM | POA: Diagnosis not present

## 2019-05-20 NOTE — Patient Instructions (Addendum)
Continue flomax at once daily.Marland Kitchen if urine output is not increased after 2 weeks... plan to increase dose of flomax.  Increase lasix to 80 mg daily ( can take lasix 40 mg in AM and again in 4 hours). Follow weights at home and urine output... call with measurements and your urine output in 3 days.  We may need to  eval  We will call to set up  Sleep referral for sleep specialist  Keep appt with urology.

## 2019-05-20 NOTE — Progress Notes (Signed)
Chief Complaint  Patient presents with  . Hospitalization Follow-up    History of Present Illness: HPI    44 year old male presents following hospitalization for cellulitis.  After last OV her on keflex with Ceftriaxone injection.. he noted worsening swelling and redness in left lower leg. On 05/15/2019 he was admitted to  [facility]. Hospital summary as follows: In ED, hemodynamically stable. Afebrile. CBC unremarkable. CMP with mild alkalosis with bicarb to 34.Lactic acid 3.1>2.0. Pro-Cal negative. BNP within normal. COVID-19 influenza PCR negative. CXR with some vascular congestion.   Started on vancomycin and ceftriaxone.Admitted for lower extremity cellulitis/edema on IV ceftriaxone and IV Lasix.  Echocardiogram without significant finding.  Venous ultrasound of bilateral lower extremity negative for DVT.  Peripheral edema/acute pulmonary vascular congestion: No history of CHF.  BNP and echo within normal.  Albumin 3.3.  Not anemic.  TSH within normal.  Lower extremity DVT Doppler negative. Seems to have some venous insufficiency.Has no cardiopulmonary symptoms.  -Discharged on home Lasix 40 mg daily.  Advised to take additional dose if more swelling.  BPH with occasional LUTS: -Started on Flomax this admission.  On the day of discharge 05/17/2019, cellulitis and edema improved.  Felt well and ready to go home.  He does discharged on home Keflex to complete course.  He was encouraged to elevate his legs and wear compression socks.   Today 05/20/19 He reports he continues to have swelling in lower legs, left greater than right. He feels he is still not urinating well.Marland Kitchen 2-3 times a day.. has to push to get urine out at time, flow starts and stops.  He missed his uro appt and is rescheduled on 05/31/2019.    His oxygen seems to decrease when he goes to sleep... snores at night.  BP Readings from Last 3 Encounters:  05/20/19 (!) 146/78  05/16/19 103/82  05/11/19 120/90     Wt Readings from Last 3 Encounters:  05/20/19 296 lb 12 oz (134.6 kg)  05/17/19 282 lb 3 oz (128 kg)  05/11/19 297 lb 8 oz (134.9 kg)    This visit occurred during the SARS-CoV-2 public health emergency.  Safety protocols were in place, including screening questions prior to the visit, additional usage of staff PPE, and extensive cleaning of exam room while observing appropriate contact time as indicated for disinfecting solutions.   COVID 19 screen:  No recent travel or known exposure to COVID19 The patient denies respiratory symptoms of COVID 19 at this time. The importance of social distancing was discussed today.     Review of Systems  Constitutional: Negative for chills and fever.  HENT: Negative for congestion and ear pain.   Eyes: Negative for pain and redness.  Respiratory: Negative for cough and shortness of breath.        Snoring  Cardiovascular: Positive for leg swelling. Negative for chest pain and palpitations.  Gastrointestinal: Negative for abdominal pain, blood in stool, constipation, diarrhea, nausea and vomiting.  Genitourinary: Negative for dysuria, frequency, hematuria and urgency.       Decreased flow  Musculoskeletal: Negative for falls and myalgias.  Skin: Negative for rash.  Neurological: Negative for dizziness.  Psychiatric/Behavioral: Negative for depression. The patient is not nervous/anxious.       Past Medical History:  Diagnosis Date  . Anxiety   . Chronic leg pain   . Depression   . Essential hypertension, benign 10/20/2015  . Schizoaffective disorder (HCC)     reports that he has been smoking cigarettes. He  started smoking about 23 years ago. He has a 23.00 pack-year smoking history. He has quit using smokeless tobacco. He reports that he does not drink alcohol or use drugs.   Current Outpatient Medications:  .  ALPRAZolam (XANAX) 1 MG tablet, TAKE 1 TABLET 4 TIMES A DAY AS NEEDED *TBF 9/28, 10/28, 11/27, 12/27, 1/26, 2/25*STOP VALIUM*,  Disp: , Rfl:  .  amantadine (SYMMETREL) 100 MG capsule, Take 100 mg by mouth 2 (two) times daily., Disp: , Rfl:  .  amphetamine-dextroamphetamine (ADDERALL) 20 MG tablet, Take 1 tablet by mouth in the morning, at noon, in the evening, and at bedtime. , Disp: , Rfl:  .  atorvastatin (LIPITOR) 40 MG tablet, TAKE 1 TABLET BY MOUTH EVERY DAY, Disp: 90 tablet, Rfl: 3 .  cephALEXin (KEFLEX) 500 MG capsule, Take 1 capsule (500 mg total) by mouth 3 (three) times daily., Disp: 21 capsule, Rfl: 0 .  diazepam (VALIUM) 10 MG tablet, Take 10 mg by mouth 4 (four) times daily., Disp: , Rfl:  .  furosemide (LASIX) 20 MG tablet, TAKE 2 TABLETS BY MOUTH EVERY DAY, Disp: 180 tablet, Rfl: 0 .  gabapentin (NEURONTIN) 600 MG tablet, Take 1,200 mg by mouth 3 (three) times daily. , Disp: , Rfl:  .  losartan (COZAAR) 100 MG tablet, Take 0.5 tablets (50 mg total) by mouth daily., Disp: 45 tablet, Rfl: 1 .  omeprazole (PRILOSEC) 20 MG capsule, Take 20 mg by mouth daily as needed., Disp: , Rfl:  .  polyethylene glycol powder (MIRALAX) 17 GM/SCOOP powder, Take 17 g by mouth 2 (two) times daily as needed for moderate constipation., Disp: 255 g, Rfl: 0 .  potassium chloride (KLOR-CON) 10 MEQ tablet, TAKE 1 TABLET BY MOUTH EVERY DAY, Disp: 90 tablet, Rfl: 1 .  propranolol (INDERAL) 20 MG tablet, Take 20 mg by mouth 4 (four) times daily., Disp: , Rfl:  .  risperiDONE (RISPERDAL) 2 MG tablet, Take 2 mg by mouth at bedtime., Disp: , Rfl:  .  senna-docusate (SENOKOT-S) 8.6-50 MG tablet, Take 1 tablet by mouth 2 (two) times daily as needed for moderate constipation., Disp: 60 tablet, Rfl: 1 .  tamsulosin (FLOMAX) 0.4 MG CAPS capsule, Take 1 capsule (0.4 mg total) by mouth daily., Disp: 90 capsule, Rfl: 1 .  tizanidine (ZANAFLEX) 2 MG capsule, TAKE 1 CAPSULE BY MOUTH 3 TIMES DAILY AS NEEDED FOR MUSCLE SPASMS., Disp: 270 capsule, Rfl: 0 .  Vitamin D, Ergocalciferol, (DRISDOL) 50000 units CAPS capsule, Take 1 capsule (50,000 Units total)  by mouth 2 (two) times a week., Disp: , Rfl:    Observations/Objective: Blood pressure (!) 146/78, pulse (!) 126, temperature 97.8 F (36.6 C), temperature source Temporal, height 6' 4.5" (1.943 m), weight 296 lb 12 oz (134.6 kg), SpO2 94 %.  Physical Exam Constitutional:      Appearance: He is well-developed.     Comments: Neck bent over  HENT:     Head: Normocephalic.     Right Ear: Hearing normal.     Left Ear: Hearing normal.     Nose: Nose normal.  Neck:     Thyroid: No thyroid mass or thyromegaly.     Vascular: No carotid bruit.     Trachea: Trachea normal.  Cardiovascular:     Rate and Rhythm: Normal rate and regular rhythm.     Pulses: Normal pulses.     Heart sounds: Heart sounds not distant. No murmur heard.  No friction rub. No gallop.   Pulmonary:  Effort: Pulmonary effort is normal. No respiratory distress.     Breath sounds: Normal breath sounds.  Abdominal:     General: Abdomen is protuberant. Bowel sounds are normal. There is distension.     Palpations: There is no shifting dullness or fluid wave.     Tenderness: There is no abdominal tenderness.  Musculoskeletal:     Right lower leg: 2+ Pitting Edema present.     Left lower leg: 2+ Pitting Edema present.  Skin:    General: Skin is warm and dry.     Findings: No rash.     Comments: Erythema decreasing in left leg... has not extended beyond marker line   improving peripheral edema in bilateral legs.. still 2 plus pitting, decreased ROM of left knee given swelling up to knees  Psychiatric:        Speech: Speech normal.        Behavior: Behavior normal.        Thought Content: Thought content normal.      Assessment and Plan  Benign prostatic hyperplasia with lower urinary tract symptoms  Followed by urology.   Continue flomax at once daily.Marland Kitchen if urine output is not increased after 2 weeks... plan to increase dose of flomax.   Left leg cellulitis No clear infeciton.. redness at this point likely  due to chronic swelling.   Peripheral edema Increase lasix to 80 mg daily ( can take lasix 40 mg in AM and again in 4 hours). Follow weights at home and urine output... call with measurements and your urine output in 3 days.   Unclear why left > right... if persistent with changes, may need to eval for compression of lymph system with CT abd/pelvis.  Snoring Likely sleep apnea. Eval with sleep study.. referral placed.    Eliezer Lofts, MD

## 2019-05-26 ENCOUNTER — Telehealth: Payer: Self-pay

## 2019-05-26 NOTE — Telephone Encounter (Signed)
Noted  

## 2019-05-26 NOTE — Telephone Encounter (Signed)
Pt said last seen on 05/20/19 and DR Ermalene Searing drew a line on upper and lower lt leg. Pt was to call if redness on leg increased or spread; pt said lt leg was better with redness and swelling but now Lt knee is swollen again, a blister was on top of pts left foot  The size of a 50 cent piece and that has burst and draining clear liquid; foot is swollen and with blister area pt cannot wear a tennis shoe. Lt leg feels warm. Today the redness has gone up leg to the mark at top of leg Dr Ermalene Searing made on 05/20/19. Also rt thigh has swollen which is new symptom. Rt upper leg feels very tender to touch today. Pt does not see red streaks on either leg.  Pt did see improvement after seeing Dr Ermalene Searing on 05/20/19 but now pt has no improvement and condition has worsened as described above.  Pt finished abx last wk pt thinks.(not sure of date finished abx). No fever, CP,SOB,H/a,dizziness, N&V. Pt said for 1 month pt has had problem with double vision and Dr Ermalene Searing was aware of double vision when pt seen 05/20/19. Pt also has new symptom of fatigue but pt thinks it is related to his psychiatric meds. pts mom will take pt to [facility] ED for reevaluation. FYI to Dr Ermalene Searing.

## 2019-05-28 ENCOUNTER — Encounter: Payer: Self-pay | Admitting: Emergency Medicine

## 2019-05-28 ENCOUNTER — Other Ambulatory Visit: Payer: Self-pay

## 2019-05-28 ENCOUNTER — Emergency Department: Payer: PPO

## 2019-05-28 ENCOUNTER — Emergency Department
Admission: EM | Admit: 2019-05-28 | Discharge: 2019-05-28 | Disposition: A | Discharge status: Home / Self Care | Payer: PPO | Attending: Emergency Medicine | Admitting: Emergency Medicine

## 2019-05-28 DIAGNOSIS — Z79899 Other long term (current) drug therapy: Secondary | ICD-10-CM | POA: Diagnosis not present

## 2019-05-28 DIAGNOSIS — I10 Essential (primary) hypertension: Secondary | ICD-10-CM | POA: Insufficient documentation

## 2019-05-28 DIAGNOSIS — R000 Tachycardia, unspecified: Secondary | ICD-10-CM | POA: Diagnosis not present

## 2019-05-28 DIAGNOSIS — M7989 Other specified soft tissue disorders: Secondary | ICD-10-CM | POA: Diagnosis present

## 2019-05-28 DIAGNOSIS — F17210 Nicotine dependence, cigarettes, uncomplicated: Secondary | ICD-10-CM | POA: Insufficient documentation

## 2019-05-28 DIAGNOSIS — L03116 Cellulitis of left lower limb: Secondary | ICD-10-CM

## 2019-05-28 DIAGNOSIS — R600 Localized edema: Secondary | ICD-10-CM | POA: Diagnosis not present

## 2019-05-28 LAB — COMPREHENSIVE METABOLIC PANEL
ALT: 55 U/L — ABNORMAL HIGH (ref 0–44)
AST: 33 U/L (ref 15–41)
Albumin: 4.2 g/dL (ref 3.5–5.0)
Alkaline Phosphatase: 143 U/L — ABNORMAL HIGH (ref 38–126)
Anion gap: 12 (ref 5–15)
BUN: 12 mg/dL (ref 6–20)
CO2: 33 mmol/L — ABNORMAL HIGH (ref 22–32)
Calcium: 9.3 mg/dL (ref 8.9–10.3)
Chloride: 91 mmol/L — ABNORMAL LOW (ref 98–111)
Creatinine, Ser: 1.03 mg/dL (ref 0.61–1.24)
GFR calc Af Amer: >60 mL/min (ref >60)
GFR calc non Af Amer: >60 mL/min (ref >60)
Glucose, Bld: 192 mg/dL — ABNORMAL HIGH (ref 70–99)
Potassium: 3.4 mmol/L — ABNORMAL LOW (ref 3.5–5.1)
Sodium: 136 mmol/L (ref 135–145)
Total Bilirubin: 0.7 mg/dL (ref 0.3–1.2)
Total Protein: 7.8 g/dL (ref 6.5–8.1)

## 2019-05-28 LAB — CBC WITH DIFFERENTIAL/PLATELET
Abs Immature Granulocytes: 0.05 10*3/uL (ref 0.00–0.07)
Basophils Absolute: 0.1 10*3/uL (ref 0.0–0.1)
Basophils Relative: 1 %
Eosinophils Absolute: 0.2 10*3/uL (ref 0.0–0.5)
Eosinophils Relative: 2 %
HCT: 47.1 % (ref 39.0–52.0)
Hemoglobin: 16.3 g/dL (ref 13.0–17.0)
Immature Granulocytes: 1 %
Lymphocytes Relative: 26 %
Lymphs Abs: 2.4 10*3/uL (ref 0.7–4.0)
MCH: 32.3 pg (ref 26.0–34.0)
MCHC: 34.6 g/dL (ref 30.0–36.0)
MCV: 93.3 fL (ref 80.0–100.0)
Monocytes Absolute: 0.4 10*3/uL (ref 0.1–1.0)
Monocytes Relative: 5 %
Neutro Abs: 6.1 10*3/uL (ref 1.7–7.7)
Neutrophils Relative %: 65 %
Platelets: 253 10*3/uL (ref 150–400)
RBC: 5.05 MIL/uL (ref 4.22–5.81)
RDW: 13.9 % (ref 11.5–15.5)
WBC: 9.2 10*3/uL (ref 4.0–10.5)
nRBC: 0 % (ref 0.0–0.2)

## 2019-05-28 LAB — LACTIC ACID, PLASMA
Lactic Acid, Venous: 2.4 mmol/L (ref 0.5–1.9)
Lactic Acid, Venous: 1.3 mmol/L (ref 0.5–1.9)

## 2019-05-28 MED ORDER — LACTATED RINGERS IV BOLUS
1000.0000 mL | Freq: Once | INTRAVENOUS | Status: AC
  Administered 2019-05-28: 1000 mL via INTRAVENOUS

## 2019-05-28 MED ORDER — CEPHALEXIN 500 MG PO CAPS: 500.0000 mg | ORAL_CAPSULE | Freq: Four times a day (QID) | ORAL | 0 refills | Status: DC

## 2019-05-28 MED ORDER — SULFAMETHOXAZOLE-TRIMETHOPRIM 400-80 MG PO TABS: 2.0000 | ORAL_TABLET | Freq: Two times a day (BID) | ORAL | 0 refills | Status: DC

## 2019-05-28 MED ORDER — VANCOMYCIN HCL IN DEXTROSE 1-5 GM/200ML-% IV SOLN
1000.0000 mg | Freq: Once | INTRAVENOUS | Status: AC
  Administered 2019-05-28: 15:00:00 1000 mg via INTRAVENOUS
  Filled 2019-05-28: qty 200

## 2019-05-28 MED ORDER — SODIUM CHLORIDE 0.9 % IV SOLN
1.0000 g | Freq: Once | INTRAVENOUS | Status: AC
  Administered 2019-05-28: 1 g via INTRAVENOUS
  Filled 2019-05-28: qty 10

## 2019-05-28 NOTE — ED Triage Notes (Signed)
Pt  From home with continued leg swelling and pain. He has recently finished a course of antibiotics for cellulitis, but it is worse today than 2 weeks ago when he was here. Pt reports that it was briefly better but "then I ran out of antibiotics."  Pt states his left leg is more swollen and red than his right, but that both are swollen. Pt alert & oriented, nad noted.

## 2019-05-28 NOTE — ED Provider Notes (Signed)
[facility] Emergency Department Provider Note   ____________________________________________   First MD Initiated Contact with Patient 05/28/19 1401     (approximate)  I have reviewed the triage vital signs and the nursing notes.   HISTORY  Chief Complaint Leg Swelling    HPI Jeffrey Lin is a 44 y.o. male with past medical history of hypertension, schizoaffective disorder, and lower extremity edema who presents to the ED complaining of pain and swelling in his legs.  Patient reports he has dealt with a couple of days of increased pain and swelling to his left lower extremity along with redness tracking up his leg.  He was initially admitted for similar cellulitis about 1 week ago, and states he was improving with IV antibiotics while he was in the hospital.  He then states he did not receive a prescription for additional antibiotics when he left the hospital, was prescribed additional Keflex by his PCP.  He completed the antibiotics and was reevaluated by his PCP a couple of days ago, where marking pen was used to mark the extent of cellulitis near his ankle.  He states that the redness has since progressed up his left lower leg and he has had increasing pain.  He denies any fevers, chills, nausea, vomiting, or issues with his right lower extremity.        Past Medical History:  Diagnosis Date  . Anxiety   . Chronic leg pain   . Depression   . Essential hypertension, benign 10/20/2015  . Schizoaffective disorder [facility])     Patient Active Problem List   Diagnosis Date Noted  . Cellulitis of left leg 05/15/2019  . Left leg cellulitis 05/11/2019  . Swelling abdomen 05/11/2019  . Thiamine deficiency 12/28/2018  . Pneumonia 03/28/2018  . Subacute delirium 03/23/2018  . Schizoaffective disorder (HCC) 03/22/2018  . Difficulty urinating 02/03/2018  . Peripheral neuropathy 05/07/2017  . Folate deficiency 05/07/2017  . Pleural effusion, left 04/12/2016   . Peripheral edema 02/29/2016  . Hoarseness of voice 11/23/2015  . Essential hypertension, benign 10/20/2015  . Schizoaffective disorder, depressive type (HCC) 02/17/2015  . Vitamin D deficiency 02/14/2015  . Low back pain 10/27/2014  . Acute upper back pain 05/21/2013  . 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 History of DVT (deep vein thrombosis) 07/06/2010  . History of patellar fracture 07/06/2010  . HYPERCHOLESTEROLEMIA 10/13/2009  . PREDIABETES 10/13/2009  . Anxiety state 06/02/2006  . TOBACCO ABUSE 06/02/2006  . Chronic back pain 06/02/2006    Past Surgical History:  Procedure Laterality Date  . Block procedure at pain center  03/27/06  . Bone graft from hip to jaw  2004  . Bone graft right side of head to jaw  2006  . 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 Sphenopalatine ganglionic      Prior to Admission medications   Medication Sig Start Date End Date Taking? Authorizing Provider  amantadine (SYMMETREL) 100 MG capsule Take 100 mg by mouth 2 (two) times daily.   Yes [provider]  amphetamine-dextroamphetamine (ADDERALL) 20 MG tablet Take 1 tablet by mouth in the morning, at noon, in the evening, and at bedtime.  08/08/18  Yes [provider]  diazepam (VALIUM) 10 MG tablet Take 10 mg by mouth 4 (four) times daily. 05/22/18  Yes [provider]  furosemide (LASIX) 20 MG tablet TAKE 2 TABLETS BY MOUTH EVERY DAY 04/13/19  Yes Bedsole, Amy E, MD  gabapentin (NEURONTIN) 600  MG tablet Take 1,200 mg by mouth 3 (three) times daily.    Yes [provider]  losartan (COZAAR) 100 MG tablet Take 0.5 tablets (50 mg total) by mouth daily. 05/11/19  Yes Bedsole, Amy E, MD  omeprazole (PRILOSEC) 20 MG capsule Take 20 mg by mouth daily as needed.   Yes [provider]  polyethylene glycol powder (MIRALAX) 17 GM/SCOOP powder Take 17 g by mouth 2 (two) times daily as needed for moderate constipation. 05/17/19  Yes Almon Hercules, MD   potassium chloride (KLOR-CON) 10 MEQ tablet TAKE 1 TABLET BY MOUTH EVERY DAY 03/18/19  Yes Bedsole, Amy E, MD  risperiDONE (RISPERDAL) 2 MG tablet Take 2 mg by mouth at bedtime. 11/24/18  Yes [provider]  tamsulosin (FLOMAX) 0.4 MG CAPS capsule Take 1 capsule (0.4 mg total) by mouth daily. 05/17/19  Yes Gonfa, Boyce Medici, MD  tizanidine (ZANAFLEX) 2 MG capsule TAKE 1 CAPSULE BY MOUTH 3 TIMES DAILY AS NEEDED FOR MUSCLE SPASMS. 03/02/19  Yes Bedsole, Amy E, MD  ALPRAZolam (XANAX) 1 MG tablet TAKE 1 TABLET 4 TIMES A DAY AS NEEDED *TBF 9/28, 10/28, 11/27, 12/27, 1/26, 2/25*STOP VALIUM* 11/17/18   [provider]  atorvastatin (LIPITOR) 40 MG tablet TAKE 1 TABLET BY MOUTH EVERY DAY Patient not taking: TAKE 1 TABLET BY MOUTH EVERY DAY 04/20/18   Bedsole, Amy E, MD  cephALEXin (KEFLEX) 500 MG capsule Take 1 capsule (500 mg total) by mouth 4 (four) times daily for 7 days. 05/28/19 06/04/19  Chesley Noon, MD  propranolol (INDERAL) 20 MG tablet Take 20 mg by mouth 4 (four) times daily. 06/02/18   [provider]  senna-docusate (SENOKOT-S) 8.6-50 MG tablet Take 1 tablet by mouth 2 (two) times daily as needed for moderate constipation. 05/17/19   Almon Hercules, MD  sulfamethoxazole-trimethoprim (BACTRIM) 400-80 MG tablet Take 2 tablets by mouth 2 (two) times daily for 7 days. 05/28/19 06/04/19  Chesley Noon, MD  Vitamin D, Ergocalciferol, (DRISDOL) 50000 units CAPS capsule Take 1 capsule (50,000 Units total) by mouth 2 (two) times a week. 07/01/16   Joaquim Nam, MD    Allergies Toradol [ketorolac tromethamine] and Lamotrigine  Family History  Problem Relation Age of Onset  . Hyperlipidemia Mother   . Hypertension Mother   . Depression Mother   . Anxiety disorder Mother   . Lung disease Father   . Depression Brother   . Anxiety disorder Brother   . Bipolar disorder Maternal Grandfather   . Schizophrenia Maternal Grandfather   . Bipolar disorder Paternal Grandfather      Social History Social History   Tobacco Use  . Smoking status: Current Every Day Smoker    Packs/day: 1.00    Years: 23.00    Pack years: 23.00    Types: Cigarettes    Start date: 09/22/1995  . Smokeless tobacco: Former Engineer, water Use Topics  . Alcohol use: No    Alcohol/week: 0.0 standard drinks  . Drug use: No    Review of Systems  Constitutional: No fever/chills Eyes: No visual changes. ENT: No sore throat. Cardiovascular: Denies chest pain. Respiratory: Denies shortness of breath. Gastrointestinal: No abdominal pain.  No nausea, no vomiting.  No diarrhea.  No constipation. Genitourinary: Negative for dysuria. Musculoskeletal: Negative for back pain.  Positive for leg swelling and pain. Skin: Negative for rash. Neurological: Negative for headaches, focal weakness or numbness.  ____________________________________________   PHYSICAL EXAM:  VITAL SIGNS: ED Triage Vitals  Enc  Vitals Group     BP 05/28/19 1214 138/83     Pulse Rate 05/28/19 1214 (!) 125     Resp 05/28/19 1214 (!) 22     Temp 05/28/19 1214 98.1 F (36.7 C)     Temp src --      SpO2 05/28/19 1214 96 %     Weight 05/28/19 1240 300 lb (136.1 kg)     Height 05/28/19 1240 6\' 6"  (1.981 m)     Head Circumference --      Peak Flow --      Pain Score 05/28/19 1239 5     Pain Loc --      Pain Edu? --      Excl. in GC? --     Constitutional: Alert and oriented. Eyes: Conjunctivae are normal. Head: Atraumatic. Nose: No congestion/rhinnorhea. Mouth/Throat: Mucous membranes are moist. Neck: Normal ROM Cardiovascular: Normal rate, regular rhythm. Grossly normal heart sounds. Respiratory: Normal respiratory effort.  No retractions. Lungs CTAB. Gastrointestinal: Soft and nontender. No distention. Genitourinary: deferred Musculoskeletal: Pitting edema to bilateral lower extremities, left greater than right.  Erythema and warmth tracking up left lower extremity to mid calf.  Shallow blister to the  dorsum of left foot with small amount of purulence.  2+ DP pulses bilaterally. Neurologic:  Normal speech and language. No gross focal neurologic deficits are appreciated. Skin:  Skin is warm, dry and intact. No rash noted. Psychiatric: Mood and affect are normal. Speech and behavior are normal.  ____________________________________________   LABS (all labs ordered are listed, but only abnormal results are displayed)  Labs Reviewed  LACTIC ACID, PLASMA - Abnormal; Notable for the following components:      Result Value   Lactic Acid, Venous 2.4 (*)    All other components within normal limits  COMPREHENSIVE METABOLIC PANEL - Abnormal; Notable for the following components:   Potassium 3.4 (*)    Chloride 91 (*)    CO2 33 (*)    Glucose, Bld 192 (*)    ALT 55 (*)    Alkaline Phosphatase 143 (*)    All other components within normal limits  CBC WITH DIFFERENTIAL/PLATELET  LACTIC ACID, PLASMA  URINALYSIS, COMPLETE (UACMP) WITH MICROSCOPIC   ____________________________________________  EKG  ED ECG REPORT I, 07/28/19, the attending physician, personally viewed and interpreted this ECG.   Date: 05/28/2019  EKG Time: 12:23  Rate: 122  Rhythm: sinus tachycardia  Axis: Normal  Intervals:none  ST&T Change: None   PROCEDURES  Procedure(s) performed (including Critical Care):  Procedures   ____________________________________________   INITIAL IMPRESSION / ASSESSMENT AND PLAN / ED COURSE       44 year old male with history of hypertension, schizoaffective disorder, and lower extremity edema presents to the ED with increasing redness, swelling, and pain extending up his left lower extremity.  He has had prior episodes of cellulitis to this left leg and was recently admitted for the same.  It seems he did not fully complete a course of Keflex at that time and now cellulitis has recurred.  While he is tachycardic, he appears overall well and there is no leukocytosis or  fever to suggest sepsis.  I feel that he would be most benefited from admission for IV antibiotics, however patient adamantly declines this.  He prefers to trial p.o. antibiotics with close PCP follow-up.  We will give initial dose of Rocephin and vancomycin, as he would likely benefit from MRSA coverage.  He is agreeable to stay for IV  fluid hydration and repeat lactate given initial lactate is elevated.  If lactate will to normalize, he may be discharged home, however if it remains elevated and he continues to be adamant that he would like to be discharged home, he will need to sign out AMA.  Patient counseled to closely follow-up with his PCP if he were to be discharged home.  Patient turned over to oncoming provider pending antibiotic administration and repeat lactate.      ____________________________________________   FINAL CLINICAL IMPRESSION(S) / ED DIAGNOSES  Final diagnoses:  Left leg cellulitis     ED Discharge Orders         Ordered    cephALEXin (KEFLEX) 500 MG capsule  4 times daily     05/28/19 1614    sulfamethoxazole-trimethoprim (BACTRIM) 400-80 MG tablet  2 times daily     05/28/19 1614           Note:  This document was prepared using Dragon voice recognition software and may include unintentional dictation errors.   Blake Divine, MD 05/28/19 1622

## 2019-05-28 NOTE — ED Notes (Signed)
Lactic 2.4. brandy charge RN notified.

## 2019-05-28 NOTE — ED Provider Notes (Signed)
Procedures     ----------------------------------------- 5:36 PM on 05/28/2019 -----------------------------------------  Assumed care from Dr. Larinda Buttery with plan to recheck the lactate after giving fluids and antibiotics, and if normalized patient will be stable for discharge home as per patient's preference since he objected to being hospitalized.  Repeat lactate is normalized to 1.3.  Vital signs unremarkable, patient feeling well and still chooses to be discharged home.  He has medical decision-making capacity.  Prescriptions for Keflex and Bactrim sent to pharmacy by Dr. Marye Round, Aneta Mins, MD 05/28/19 [phone]

## 2019-05-28 NOTE — ED Notes (Signed)
Esign not available pt verbalized discharge instructions and has no questions at this time. 

## 2019-05-31 ENCOUNTER — Encounter: Payer: Self-pay | Admitting: Urology

## 2019-05-31 ENCOUNTER — Ambulatory Visit (INDEPENDENT_AMBULATORY_CARE_PROVIDER_SITE_OTHER): Payer: PPO | Admitting: Urology

## 2019-05-31 ENCOUNTER — Other Ambulatory Visit: Payer: Self-pay

## 2019-05-31 VITALS — BP 139/85 | HR 120 | Ht 78.0 in | Wt 313.0 lb

## 2019-05-31 DIAGNOSIS — N138 Other obstructive and reflux uropathy: Secondary | ICD-10-CM | POA: Diagnosis not present

## 2019-05-31 DIAGNOSIS — N401 Enlarged prostate with lower urinary tract symptoms: Secondary | ICD-10-CM

## 2019-05-31 DIAGNOSIS — N200 Calculus of kidney: Secondary | ICD-10-CM

## 2019-05-31 LAB — BLADDER SCAN AMB NON-IMAGING

## 2019-05-31 MED ORDER — TAMSULOSIN HCL 0.4 MG PO CAPS: 0.4000 mg | ORAL_CAPSULE | Freq: Every day | ORAL | 3 refills | Status: DC

## 2019-05-31 NOTE — Patient Instructions (Signed)

## 2019-05-31 NOTE — Progress Notes (Signed)
   05/31/2019 1:55 PM   Jeffrey Lin 1975-02-07 [id number]  Reason for visit: Follow up BPH and history of urinary retention, nephrolithiasis  HPI: I saw Jeffrey Lin back in urology clinic today for follow-up of the above issues.  He is a 44 year old male with medical history notable for extensive psych history who I originally met in March 2020 after he was hospitalized for pneumonia complicated by urinary retention requiring Foley catheter as well as an asymptomatic 3 mm right distal ureteral stone seen on CT at that hospitalization.  Prostate measured 32 g on CT, and he ultimately passed the small distal stone spontaneously.  He passed a voiding trial in follow-up and had been voiding with a strong stream and low PVR without any urinary problems.  He is here for routine follow-up today.  He reports that overall his urination is going well and he does not have any significant complaints.  He denies any gross hematuria or UTIs.  He does occasionally feel like he has incomplete bladder emptying.  PVR is relatively normal at 150 mL today.  He recently restarted Flomax as he was having some increased urinary frequency, however he also recently was started on 40 mg Lasix daily.  He would like to continue the Flomax, as he does feel that it helps his urinary stream.  We discussed the side effects.  We discussed behavioral strategies regarding his urinary symptoms, as well as stone prevention strategies of increased fluid intake, citrate-containing beverages, and avoiding high salt foods.  Continue Flomax, refilled Stone prevention strategies discussed RTC 1 year for PVR and symptom check  Billey Co, MD  [place] [address] [place], [place] [address] [phone]

## 2019-06-01 ENCOUNTER — Other Ambulatory Visit: Payer: Self-pay | Admitting: Family Medicine

## 2019-06-01 NOTE — Telephone Encounter (Signed)
Last office visit 05/20/2019 for hospital follow up.  Last refilled 03/02/2019 for #[id number] with no refills.  Next Appt: 06/04/2019 for hospital follow up.

## 2019-06-04 ENCOUNTER — Ambulatory Visit (INDEPENDENT_AMBULATORY_CARE_PROVIDER_SITE_OTHER): Payer: PPO | Admitting: Family Medicine

## 2019-06-04 ENCOUNTER — Encounter: Payer: Self-pay | Admitting: Family Medicine

## 2019-06-04 ENCOUNTER — Other Ambulatory Visit: Payer: Self-pay

## 2019-06-04 VITALS — BP 156/86 | HR 122 | Temp 98.3°F | Ht 76.5 in | Wt 314.0 lb

## 2019-06-04 DIAGNOSIS — R39198 Other difficulties with micturition: Secondary | ICD-10-CM

## 2019-06-04 DIAGNOSIS — R1900 Intra-abdominal and pelvic swelling, mass and lump, unspecified site: Secondary | ICD-10-CM | POA: Diagnosis not present

## 2019-06-04 DIAGNOSIS — L03116 Cellulitis of left lower limb: Secondary | ICD-10-CM

## 2019-06-04 DIAGNOSIS — R609 Edema, unspecified: Secondary | ICD-10-CM | POA: Diagnosis not present

## 2019-06-04 MED ORDER — TORSEMIDE 20 MG PO TABS: 60.0000 mg | ORAL_TABLET | Freq: Every day | ORAL | 0 refills | Status: DC

## 2019-06-04 MED ORDER — SULFAMETHOXAZOLE-TRIMETHOPRIM 400-80 MG PO TABS: 2.0000 | ORAL_TABLET | Freq: Two times a day (BID) | ORAL | 0 refills | Status: AC

## 2019-06-04 MED ORDER — CEPHALEXIN 500 MG PO CAPS: 500.0000 mg | ORAL_CAPSULE | Freq: Four times a day (QID) | ORAL | 0 refills | Status: AC

## 2019-06-04 NOTE — Progress Notes (Signed)
Chief Complaint  Patient presents with  . Hospitalization Follow-up    Left Leg Cellulitis    History of Present Illness: HPI  44 year old male presents following second ER visit for left leg cellulitis. He returned to ED on 5/7 for worsening left leg pain and redness.   Given IM rocephin and vancomycin and IVF. Refused hospitalizaiton for IV antibiotics at that time.  Sent home with keflex and bactrim.  Wt Readings from Last 3 Encounters:  06/04/19 (!) 314 lb (142.4 kg)  05/31/19 (!) 313 lb (142 kg)  05/28/19 300 lb (136.1 kg)     He has noted less redness, less pain. Now both legs red and swollen.  No fever no flu like symptoms.  no CP, no SOB.  No change in edema with lasix 40 mg twice  without much diuresis.  Negative venous doppler in 04/2019  Elevating legs above heart. Stopped compression hose given pain.    This visit occurred during the SARS-CoV-2 public health emergency.  Safety protocols were in place, including screening questions prior to the visit, additional usage of staff PPE, and extensive cleaning of exam room while observing appropriate contact time as indicated for disinfecting solutions.   COVID 19 screen:  No recent travel or known exposure to COVID19 The patient denies respiratory symptoms of COVID 19 at this time. The importance of social distancing was discussed today.     Review of Systems  Constitutional: Negative for chills and fever.  HENT: Negative for congestion and ear pain.   Eyes: Negative for pain and redness.  Respiratory: Negative for cough and shortness of breath.   Cardiovascular: Negative for chest pain, palpitations and leg swelling.  Gastrointestinal: Negative for abdominal pain, blood in stool, constipation, diarrhea, nausea and vomiting.  Genitourinary: Negative for dysuria.  Musculoskeletal: Negative for falls and myalgias.  Skin: Negative for rash.  Neurological: Negative for dizziness.  Psychiatric/Behavioral:  Negative for depression. The patient is not nervous/anxious.       Past Medical History:  Diagnosis Date  . Anxiety   . Chronic leg pain   . Depression   . Essential hypertension, benign 10/20/2015  . Schizoaffective disorder (HCC)     reports that he has been smoking cigarettes. He started smoking about 23 years ago. He has a 23.00 pack-year smoking history. He has quit using smokeless tobacco. He reports that he does not drink alcohol or use drugs.   Current Outpatient Medications:  .  amantadine (SYMMETREL) 100 MG capsule, Take 100 mg by mouth 2 (two) times daily., Disp: , Rfl:  .  amphetamine-dextroamphetamine (ADDERALL) 20 MG tablet, Take 1 tablet by mouth in the morning, at noon, in the evening, and at bedtime. , Disp: , Rfl:  .  atorvastatin (LIPITOR) 40 MG tablet, TAKE 1 TABLET BY MOUTH EVERY DAY, Disp: 90 tablet, Rfl: 3 .  cephALEXin (KEFLEX) 500 MG capsule, Take 1 capsule (500 mg total) by mouth 4 (four) times daily for 7 days., Disp: 28 capsule, Rfl: 0 .  diazepam (VALIUM) 10 MG tablet, Take 10 mg by mouth 4 (four) times daily., Disp: , Rfl:  .  furosemide (LASIX) 20 MG tablet, TAKE 2 TABLETS BY MOUTH EVERY DAY, Disp: 180 tablet, Rfl: 0 .  gabapentin (NEURONTIN) 600 MG tablet, Take 1,200 mg by mouth 3 (three) times daily. , Disp: , Rfl:  .  losartan (COZAAR) 100 MG tablet, Take 0.5 tablets (50 mg total) by mouth daily., Disp: 45 tablet, Rfl: 1 .  omeprazole (  PRILOSEC) 20 MG capsule, Take 20 mg by mouth daily as needed., Disp: , Rfl:  .  polyethylene glycol powder (MIRALAX) 17 GM/SCOOP powder, Take 17 g by mouth 2 (two) times daily as needed for moderate constipation., Disp: 255 g, Rfl: 0 .  potassium chloride (KLOR-CON) 10 MEQ tablet, TAKE 1 TABLET BY MOUTH EVERY DAY, Disp: 90 tablet, Rfl: 1 .  propranolol (INDERAL) 20 MG tablet, Take 20 mg by mouth 4 (four) times daily., Disp: , Rfl:  .  risperiDONE (RISPERDAL) 2 MG tablet, Take 2 mg by mouth at bedtime., Disp: , Rfl:  .   senna-docusate (SENOKOT-S) 8.6-50 MG tablet, Take 1 tablet by mouth 2 (two) times daily as needed for moderate constipation., Disp: 60 tablet, Rfl: 1 .  sulfamethoxazole-trimethoprim (BACTRIM) 400-80 MG tablet, Take 2 tablets by mouth 2 (two) times daily for 7 days., Disp: 28 tablet, Rfl: 0 .  tamsulosin (FLOMAX) 0.4 MG CAPS capsule, Take 1 capsule (0.4 mg total) by mouth daily., Disp: 90 capsule, Rfl: 3 .  tizanidine (ZANAFLEX) 2 MG capsule, TAKE 1 CAPSULE BY MOUTH 3 TIMES DAILY AS NEEDED FOR MUSCLE SPASMS., Disp: 270 capsule, Rfl: 0 .  Vitamin D, Ergocalciferol, (DRISDOL) 50000 units CAPS capsule, Take 1 capsule (50,000 Units total) by mouth 2 (two) times a week., Disp: , Rfl:    Observations/Objective: Blood pressure (!) 156/86, pulse (!) 122, temperature 98.3 F (36.8 C), temperature source Temporal, height 6' 4.5" (1.943 m), weight (!) 314 lb (142.4 kg), SpO2 92 %.  Physical Exam Constitutional:      Appearance: He is well-developed.  HENT:     Head: Normocephalic.     Right Ear: Hearing normal.     Left Ear: Hearing normal.     Nose: Nose normal.  Neck:     Thyroid: No thyroid mass or thyromegaly.     Vascular: No carotid bruit.     Trachea: Trachea normal.  Cardiovascular:     Rate and Rhythm: Normal rate and regular rhythm.     Pulses: Normal pulses.     Heart sounds: Heart sounds not distant. No murmur. No friction rub. No gallop.      Comments: Now bilateral  peripheral edema L>>R  Pulmonary:     Effort: Pulmonary effort is normal. No respiratory distress.     Breath sounds: Normal breath sounds.  Abdominal:     General: There is distension.     Palpations: There is no shifting dullness, fluid wave, hepatomegaly or splenomegaly.     Tenderness: There is no abdominal tenderness.  Feet:     Left foot:     Skin integrity: Skin breakdown present.     Comments: Left dorsal foot with scabbed over blister, no assiciated discharge. Foot uniformely red and swollen Skin:     General: Skin is warm and dry.     Findings: No rash.     Comments: Now bialteral mild erythema and increase warmth in legs associated with edema  Psychiatric:        Speech: Speech normal.        Behavior: Behavior normal.        Thought Content: Thought content normal.      Assessment and Plan  Peripheral edema  Now appears redness ay more likely be due to swelling alone as opposed to infection. Elevated feet, increase walking as able. Unresponsive to furosemide. Change to torsemide  3 tabs daily. Increase potassium to 20 Meq daily.  Close follow up in 1 week.  Left leg cellulitis Now bilateral redness, left leg changes improving off and on.   Will treat edema more aggressively and continue antibiotics to cover for MRSA.  Difficulty urinating Somewhat improved with flomax.  Swelling abdomen More likely      Eliezer Lofts, MD

## 2019-06-04 NOTE — Assessment & Plan Note (Signed)
Now bilateral redness, left leg changes improving off and on.   Will treat edema more aggressively and continue antibiotics to cover for MRSA.

## 2019-06-04 NOTE — Assessment & Plan Note (Signed)
Somewhat improved with flomax.

## 2019-06-04 NOTE — Assessment & Plan Note (Addendum)
More likely  Central obesity as opposed to  Ascites.  Given severe peripheral edema Left > Right. If not improving consider CT pelvis to rule out compression of venous and lymph structures.

## 2019-06-04 NOTE — Assessment & Plan Note (Signed)
Now appears redness ay more likely be due to swelling alone as opposed to infection. Elevated feet, increase walking as able. Unresponsive to furosemide. Change to torsemide  3 tabs daily. Increase potassium to 20 Meq daily.  Close follow up in 1 week.

## 2019-06-04 NOTE — Patient Instructions (Addendum)
Stop lasix change to torsemide 3 tabs daily  Until you follow up in 1 week. Elevating legs above heart. Try to increase walking as much as able.   Continue antibiotics 7 ore days. Increase potassium to 2 tabs daily  until follow up.

## 2019-06-10 ENCOUNTER — Other Ambulatory Visit: Payer: Self-pay

## 2019-06-10 ENCOUNTER — Emergency Department: Payer: PPO

## 2019-06-10 ENCOUNTER — Emergency Department
Admission: EM | Admit: 2019-06-10 | Discharge: 2019-06-11 | Disposition: A | Discharge status: Home / Self Care | Payer: PPO | Attending: Emergency Medicine | Admitting: Emergency Medicine

## 2019-06-10 ENCOUNTER — Encounter: Payer: Self-pay | Admitting: *Deleted

## 2019-06-10 DIAGNOSIS — R000 Tachycardia, unspecified: Secondary | ICD-10-CM | POA: Insufficient documentation

## 2019-06-10 DIAGNOSIS — F17210 Nicotine dependence, cigarettes, uncomplicated: Secondary | ICD-10-CM | POA: Insufficient documentation

## 2019-06-10 DIAGNOSIS — Z5320 Procedure and treatment not carried out because of patient's decision for unspecified reasons: Secondary | ICD-10-CM | POA: Diagnosis not present

## 2019-06-10 DIAGNOSIS — L03116 Cellulitis of left lower limb: Secondary | ICD-10-CM | POA: Diagnosis present

## 2019-06-10 DIAGNOSIS — R0602 Shortness of breath: Secondary | ICD-10-CM | POA: Insufficient documentation

## 2019-06-10 DIAGNOSIS — I872 Venous insufficiency (chronic) (peripheral): Secondary | ICD-10-CM | POA: Diagnosis not present

## 2019-06-10 DIAGNOSIS — R600 Localized edema: Secondary | ICD-10-CM | POA: Diagnosis not present

## 2019-06-10 DIAGNOSIS — R2243 Localized swelling, mass and lump, lower limb, bilateral: Secondary | ICD-10-CM | POA: Diagnosis not present

## 2019-06-10 DIAGNOSIS — Z79899 Other long term (current) drug therapy: Secondary | ICD-10-CM | POA: Diagnosis not present

## 2019-06-10 DIAGNOSIS — R609 Edema, unspecified: Secondary | ICD-10-CM

## 2019-06-10 DIAGNOSIS — I10 Essential (primary) hypertension: Secondary | ICD-10-CM | POA: Diagnosis not present

## 2019-06-10 LAB — COMPREHENSIVE METABOLIC PANEL
ALT: 55 U/L — ABNORMAL HIGH (ref 0–44)
AST: 33 U/L (ref 15–41)
Albumin: 3.5 g/dL (ref 3.5–5.0)
Alkaline Phosphatase: 127 U/L — ABNORMAL HIGH (ref 38–126)
Anion gap: 12 (ref 5–15)
BUN: 11 mg/dL (ref 6–20)
CO2: 33 mmol/L — ABNORMAL HIGH (ref 22–32)
Calcium: 9 mg/dL (ref 8.9–10.3)
Chloride: 92 mmol/L — ABNORMAL LOW (ref 98–111)
Creatinine, Ser: 1.08 mg/dL (ref 0.61–1.24)
GFR calc Af Amer: >60 mL/min (ref >60)
GFR calc non Af Amer: >60 mL/min (ref >60)
Glucose, Bld: 174 mg/dL — ABNORMAL HIGH (ref 70–99)
Potassium: 3.9 mmol/L (ref 3.5–5.1)
Sodium: 137 mmol/L (ref 135–145)
Total Bilirubin: 0.6 mg/dL (ref 0.3–1.2)
Total Protein: 6.7 g/dL (ref 6.5–8.1)

## 2019-06-10 LAB — CBC WITH DIFFERENTIAL/PLATELET
Abs Immature Granulocytes: 0.1 10*3/uL — ABNORMAL HIGH (ref 0.00–0.07)
Basophils Absolute: 0.1 10*3/uL (ref 0.0–0.1)
Basophils Relative: 1 %
Eosinophils Absolute: 0.3 10*3/uL (ref 0.0–0.5)
Eosinophils Relative: 3 %
HCT: 42.8 % (ref 39.0–52.0)
Hemoglobin: 14 g/dL (ref 13.0–17.0)
Immature Granulocytes: 1 %
Lymphocytes Relative: 27 %
Lymphs Abs: 2.3 10*3/uL (ref 0.7–4.0)
MCH: 31.8 pg (ref 26.0–34.0)
MCHC: 32.7 g/dL (ref 30.0–36.0)
MCV: 97.3 fL (ref 80.0–100.0)
Monocytes Absolute: 0.4 10*3/uL (ref 0.1–1.0)
Monocytes Relative: 5 %
Neutro Abs: 5.4 10*3/uL (ref 1.7–7.7)
Neutrophils Relative %: 63 %
Platelets: 217 10*3/uL (ref 150–400)
RBC: 4.4 MIL/uL (ref 4.22–5.81)
RDW: 14.2 % (ref 11.5–15.5)
WBC: 8.5 10*3/uL (ref 4.0–10.5)
nRBC: 0 % (ref 0.0–0.2)

## 2019-06-10 LAB — TROPONIN I (HIGH SENSITIVITY): Troponin I (High Sensitivity): 4 ng/L (ref <18)

## 2019-06-10 LAB — LACTIC ACID, PLASMA: Lactic Acid, Venous: 2.6 mmol/L (ref 0.5–1.9)

## 2019-06-10 LAB — BRAIN NATRIURETIC PEPTIDE: B Natriuretic Peptide: 25 pg/mL (ref 0.0–100.0)

## 2019-06-10 MED ORDER — VANCOMYCIN HCL 2000 MG/400ML IV SOLN
2000.0000 mg | Freq: Once | INTRAVENOUS | Status: AC
  Administered 2019-06-11: 2000 mg via INTRAVENOUS
  Filled 2019-06-10: qty 400

## 2019-06-10 MED ORDER — VANCOMYCIN HCL 500 MG/100ML IV SOLN
500.0000 mg | Freq: Once | INTRAVENOUS | Status: DC
  Filled 2019-06-10: qty 100

## 2019-06-10 MED ORDER — IOHEXOL 350 MG/ML SOLN
75.0000 mL | Freq: Once | INTRAVENOUS | Status: AC | PRN
  Administered 2019-06-10: 75 mL via INTRAVENOUS

## 2019-06-10 MED ORDER — SODIUM CHLORIDE 0.9 % IV SOLN
2.0000 g | INTRAVENOUS | Status: DC
  Administered 2019-06-11: 2 g via INTRAVENOUS
  Filled 2019-06-10: qty 20

## 2019-06-10 NOTE — Progress Notes (Signed)
PHARMACY -  BRIEF ANTIBIOTIC NOTE   Pharmacy has received consult(s) for vancomycin from an ED provider.  The patient's profile has been reviewed for ht/wt/allergies/indication/available labs.    One time order(s) placed for vancomycin 2.5g IV load  Further antibiotics/pharmacy consults should be ordered by admitting physician if indicated.                       Thank you,  Thomasene Ripple, PharmD, BCPS Clinical Pharmacist 06/10/2019  11:45 PM

## 2019-06-10 NOTE — ED Provider Notes (Signed)
Jeffrey Lin  ____________________________________________   First MD Initiated Contact with Patient 06/10/19 2308     (approximate)  I have reviewed the triage vital signs and the nursing notes.   HISTORY  Chief Complaint Cellulitis and Shortness of Breath  Level 5 caveat:  history/ROS limited by acute/critical illness  HPI Jeffrey Lin is a 44 y.o. male with medical history as listed below who was treated about a month ago for left lower extremity cellulitis and who had a left lower extremity Doppler ultrasound negative for DVT.  He presents tonight for worsening symptoms overall that include shortness of breath, rapid heart rate, and bilateral leg swelling and redness.  He is concerned that the infection never got better and spread to both of his legs.  He says that nothing in particular makes the shortness of breath worse but it has become severe over the last couple of days.  He is able to lie flat but exertion makes the symptoms a little bit worse.  He has not had a cough nor shortness of breath.  He denies fever/chills, chest pain, nausea, vomiting, abdominal pain, and dysuria.  He said that the only thing that hurts is his legs and it is now both legs and extending all the way up into his thighs.  He said that he is so swollen in his legs that he cannot wear his usual pants and has to wear loosefitting shorts.  He has had no injury of which she is aware.  Overall he describes his symptoms as severe.  No history of blood clots.  He says he finished his course of antibiotics which was Keflex.  He is a bit of a vague historian and has seen his primary care doctor what sounds like several times since his ED visit.  She showed him how to mark the area of swelling on his leg and he has been doing so and said that it goes up and down intermittently but he is not sure what makes it do so.        Past Medical History:  Diagnosis  Date  . Anxiety   . Chronic leg pain   . Depression   . Essential hypertension, benign 10/20/2015  . Schizoaffective disorder [facility](HCC)     Patient Active Problem List   Diagnosis Date Noted  . Cellulitis of left leg 05/15/2019  . Left leg cellulitis 05/11/2019  . Swelling abdomen 05/11/2019  . Thiamine deficiency 12/28/2018  . Pneumonia 03/28/2018  . Subacute delirium 03/23/2018  . Schizoaffective disorder (HCC) 03/22/2018  . Difficulty urinating 02/03/2018  . Peripheral neuropathy 05/07/2017  . Folate deficiency 05/07/2017  . Pleural effusion, left 04/12/2016  . Peripheral edema 02/29/2016  . Hoarseness of voice 11/23/2015  . Essential hypertension, benign 10/20/2015  . Schizoaffective disorder, depressive type (HCC) 02/17/2015  . Vitamin D deficiency 02/14/2015  . Low back pain 10/27/2014  . Acute upper back pain 05/21/2013  . 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 History of DVT (deep vein thrombosis) 07/06/2010  . History of patellar fracture 07/06/2010  . HYPERCHOLESTEROLEMIA 10/13/2009  . PREDIABETES 10/13/2009  . Anxiety state 06/02/2006  . TOBACCO ABUSE 06/02/2006  . Chronic back pain 06/02/2006    Past Surgical History:  Procedure Laterality Date  . Block procedure at pain center  03/27/06  . Bone graft from hip to jaw  2004  . Bone graft right side of head to jaw  2006  . 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 Sphenopalatine ganglionic      Prior to Admission medications   Medication Sig Start Date End Date Taking? Authorizing Provider  amantadine (SYMMETREL) 100 MG capsule Take 100 mg by mouth 2 (two) times daily.    [provider]  amphetamine-dextroamphetamine (ADDERALL) 20 MG tablet Take 1 tablet by mouth in the morning, at noon, in the evening, and at bedtime.  08/08/18   [provider]  atorvastatin (LIPITOR) 40 MG tablet TAKE 1 TABLET BY MOUTH EVERY DAY 04/20/18   Bedsole, Amy E, MD  cephALEXin (KEFLEX) 500 MG capsule Take 1  capsule (500 mg total) by mouth 4 (four) times daily for 7 days. 06/04/19 06/11/19  Bedsole, Amy E, MD  diazepam (VALIUM) 10 MG tablet Take 10 mg by mouth 4 (four) times daily. 05/22/18   [provider]  gabapentin (NEURONTIN) 600 MG tablet Take 1,200 mg by mouth 3 (three) times daily.     [provider]  losartan (COZAAR) 100 MG tablet Take 0.5 tablets (50 mg total) by mouth daily. 05/11/19   Bedsole, Amy E, MD  omeprazole (PRILOSEC) 20 MG capsule Take 20 mg by mouth daily as needed.    [provider]  polyethylene glycol powder (MIRALAX) 17 GM/SCOOP powder Take 17 g by mouth 2 (two) times daily as needed for moderate constipation. 05/17/19   Almon Hercules, MD  potassium chloride (KLOR-CON) 10 MEQ tablet TAKE 1 TABLET BY MOUTH EVERY DAY 03/18/19   Bedsole, Amy E, MD  propranolol (INDERAL) 20 MG tablet Take 20 mg by mouth 4 (four) times daily. 06/02/18   [provider]  risperiDONE (RISPERDAL) 2 MG tablet Take 2 mg by mouth at bedtime. 11/24/18   [provider]  senna-docusate (SENOKOT-S) 8.6-50 MG tablet Take 1 tablet by mouth 2 (two) times daily as needed for moderate constipation. 05/17/19   Almon Hercules, MD  sulfamethoxazole-trimethoprim (BACTRIM) 400-80 MG tablet Take 2 tablets by mouth 2 (two) times daily for 7 days. 06/04/19 06/11/19  Excell Seltzer, MD  tamsulosin (FLOMAX) 0.4 MG CAPS capsule Take 1 capsule (0.4 mg total) by mouth daily. 05/31/19   Sondra Come, MD  tizanidine (ZANAFLEX) 2 MG capsule TAKE 1 CAPSULE BY MOUTH 3 TIMES DAILY AS NEEDED FOR MUSCLE SPASMS. 06/01/19   Bedsole, Amy E, MD  torsemide (DEMADEX) 20 MG tablet Take 3 tablets (60 mg total) by mouth daily. 06/04/19   Bedsole, Amy E, MD  Vitamin D, Ergocalciferol, (DRISDOL) 50000 units CAPS capsule Take 1 capsule (50,000 Units total) by mouth 2 (two) times a week. 07/01/16   Joaquim Nam, MD    Allergies Toradol [ketorolac tromethamine] and Lamotrigine  Family History  Problem  Relation Age of Onset  . Hyperlipidemia Mother   . Hypertension Mother   . Depression Mother   . Anxiety disorder Mother   . Lung disease Father   . Depression Brother   . Anxiety disorder Brother   . Bipolar disorder Maternal Grandfather   . Schizophrenia Maternal Grandfather   . Bipolar disorder Paternal Grandfather     Social History Social History   Tobacco Use  . Smoking status: Current Every Day Smoker    Packs/day: 1.00    Years: 23.00    Pack years: 23.00    Types: Cigarettes    Start date: 09/22/1995  . Smokeless tobacco: Former Engineer, water Use Topics  . Alcohol use: No    Alcohol/week: 0.0 standard  drinks  . Drug use: No    Review of Systems Constitutional: No fever/chills Eyes: No visual changes. ENT: No sore throat. Cardiovascular: Denies chest pain. Respiratory: +shortness of breath.  Denies orthopnea.  Some exertional element to the dyspnea. Gastrointestinal: No abdominal pain.  No nausea, no vomiting.  No diarrhea.  No constipation. Genitourinary: Negative for dysuria. Musculoskeletal: Pain in bilateral legs.  Bilateral lower extremity swelling extending from the feet to the thighs with redness, worse than during the prior visit. Integumentary: Lower extremity skin thickening and erythema. Neurological: Negative for headaches, focal weakness or numbness.   ____________________________________________   PHYSICAL EXAM:  VITAL SIGNS: ED Triage Vitals  Enc Vitals Group     BP 06/10/19 2146 (!) 144/84     Pulse Rate 06/10/19 2146 (!) 129     Resp 06/10/19 2146 (!) 22     Temp 06/10/19 2146 98.4 F (36.9 C)     Temp Source 06/10/19 2146 Oral     SpO2 06/10/19 2146 95 %     Weight 06/10/19 2147 (!) 142.9 kg (315 lb)     Height 06/10/19 2147 1.981 m ( )     Head Circumference --      Peak Flow --      Pain Score --      Pain Loc --      Pain Edu? --      Excl. in GC? --     Constitutional: Alert and oriented.  Eyes: Conjunctivae are  normal.  Head: Atraumatic. Nose: No congestion/rhinnorhea. Mouth/Throat: Patient is wearing a mask. Neck: No stridor.  No meningeal signs.   Cardiovascular: Tachycardia, regular rhythm. Grossly normal heart sounds. Respiratory: Normal respiratory effort.  No retractions.  Patient satting 90% on room air at rest. Gastrointestinal: Soft and nontender.  Abdomen appears somewhat distended but firm, as in anasarca. Musculoskeletal: Patient has tense pitting edema in bilateral legs that appears to extend up to the thigh, most notable in the right leg.  There is skin thickening and evidence of chronic changes.  The erythema is profound but generalized and has the appearance of being subacute or chronic rather than acute as an cellulitis. Neurologic:  Normal speech and language. No gross focal neurologic deficits are appreciated.  Skin:  Skin is warm, dry and intact.  See musculoskeletal exam for additional details. Psychiatric: Mood and affect are normal. Speech and behavior are normal.  ____________________________________________   LABS (all labs ordered are listed, but only abnormal results are displayed)  Labs Reviewed  LACTIC ACID, PLASMA - Abnormal; Notable for the following components:      Result Value   Lactic Acid, Venous 2.6 (*)    All other components within normal limits  COMPREHENSIVE METABOLIC PANEL - Abnormal; Notable for the following components:   Chloride 92 (*)    CO2 33 (*)    Glucose, Bld 174 (*)    ALT 55 (*)    Alkaline Phosphatase 127 (*)    All other components within normal limits  CBC WITH DIFFERENTIAL/PLATELET - Abnormal; Notable for the following components:   Abs Immature Granulocytes 0.10 (*)    All other components within normal limits  SARS CORONAVIRUS 2 BY RT PCR (HOSPITAL ORDER, PERFORMED IN [facility])  URINE CULTURE  CULTURE, BLOOD (ROUTINE X 2)  CULTURE, BLOOD (ROUTINE X 2)  LACTIC ACID, PLASMA  BRAIN NATRIURETIC PEPTIDE   PROCALCITONIN  URINALYSIS, COMPLETE (UACMP) WITH MICROSCOPIC  TROPONIN I (HIGH SENSITIVITY)  TROPONIN I (HIGH  SENSITIVITY)   ____________________________________________  EKG  ED ECG REPORT I, Loleta Rose, the attending physician, personally viewed and interpreted this ECG.  Date: 06/10/2019 EKG Time: 22: 02 Rate: 124 Rhythm: Sinus tachycardia QRS Axis: Right axis deviation Intervals: Incomplete right bundle branch block ST/T Wave abnormalities: Non-specific ST segment / T-wave changes, but no clear evidence of acute ischemia. Narrative Interpretation: no definitive evidence of acute ischemia; does not meet STEMI criteria.   ____________________________________________  RADIOLOGY I, Loleta Rose, personally viewed and evaluated these images (plain radiographs) as part of my medical decision making, as well as reviewing the written report by the radiologist.  ED MD interpretation: No acute abnormality identified on chest x-ray.  No acute abnormalities on CTA chest nor lower extremity venous dopplers  Official radiology report(s): DG Chest 2 View  Result Date: 06/10/2019 CLINICAL DATA:  Shortness of breath with left leg pain, swelling and redness. EXAM: CHEST - 2 VIEW COMPARISON:  May 28, 2019 FINDINGS: Mild, stable increased lung markings are seen without evidence of acute infiltrate, pleural effusion or pneumothorax. The cardiac silhouette is mildly enlarged and unchanged in size. The visualized skeletal structures are unremarkable. IMPRESSION: No active cardiopulmonary disease. Electronically Signed   By: Aram Candela M.D.   On: 06/10/2019 22:16   CT Angio Chest PE W/Cm &/Or Wo Cm  Result Date: 06/11/2019 CLINICAL DATA:  Shortness of breath. EXAM: CT ANGIOGRAPHY CHEST WITH CONTRAST TECHNIQUE: Multidetector CT imaging of the chest was performed using the standard protocol during bolus administration of intravenous contrast. Multiplanar CT image reconstructions and MIPs were  obtained to evaluate the vascular anatomy. CONTRAST:  57mL OMNIPAQUE IOHEXOL 350 MG/ML SOLN COMPARISON:  March 28, 2018 FINDINGS: Lower Cardiovascular: Evaluation for pulmonary emboli is slightly limited by respiratory motion artifact.Given this limitation, no acute pulmonary embolism was detected. The main pulmonary artery is within normal limits for size. There is no CT evidence of acute right heart strain. The visualized aorta is normal. Heart size is normal, without pericardial effusion. Mediastinum/Nodes: --No mediastinal or hilar lymphadenopathy. --No axillary lymphadenopathy. --No supraclavicular lymphadenopathy. --Normal thyroid gland. --The esophagus is unremarkable Lungs/Pleura: There is atelectasis at the lung bases. There is no pneumothorax. No large pleural effusion. The trachea is unremarkable. Upper Abdomen: No acute abnormality. Musculoskeletal: No chest wall abnormality. No acute or significant osseous findings. Review of the MIP images confirms the above findings. IMPRESSION: 1. No evidence of pulmonary embolism. 2. Bibasilar atelectasis. Electronically Signed   By: Katherine Mantle M.D.   On: 06/11/2019 00:19   US Venous Img Lower Bilateral  Result Date: 06/11/2019 CLINICAL DATA:  Initial evaluation for edematous and erythematous bilateral lower extremities. EXAM: BILATERAL LOWER EXTREMITY VENOUS DOPPLER ULTRASOUND TECHNIQUE: Gray-scale sonography with graded compression, as well as color Doppler and duplex ultrasound were performed to evaluate the lower extremity deep venous systems from the level of the common femoral vein and including the common femoral, femoral, profunda femoral, popliteal and calf veins including the posterior tibial, peroneal and gastrocnemius veins when visible. The superficial great saphenous vein was also interrogated. Spectral Doppler was utilized to evaluate flow at rest and with distal augmentation maneuvers in the common femoral, femoral and popliteal veins.  COMPARISON:  None. FINDINGS: RIGHT LOWER EXTREMITY Common Femoral Vein: No evidence of thrombus. Normal compressibility, respiratory phasicity and response to augmentation. Saphenofemoral Junction: No evidence of thrombus. Normal compressibility and flow on color Doppler imaging. Profunda Femoral Vein: No evidence of thrombus. Normal compressibility and flow on color Doppler imaging. Femoral Vein: No  evidence of thrombus. Normal compressibility, respiratory phasicity and response to augmentation. Popliteal Vein: No evidence of thrombus. Normal compressibility, respiratory phasicity and response to augmentation. Calf Veins: No evidence of thrombus. Normal compressibility and flow on color Doppler imaging. Superficial Great Saphenous Vein: No evidence of thrombus. Normal compressibility. Venous Reflux:  None. Other Findings: Mild diffuse soft tissue/interstitial edema seen within the subcutaneous soft tissues. LEFT LOWER EXTREMITY Common Femoral Vein: No evidence of thrombus. Normal compressibility, respiratory phasicity and response to augmentation. Saphenofemoral Junction: No evidence of thrombus. Normal compressibility and flow on color Doppler imaging. Profunda Femoral Vein: No evidence of thrombus. Normal compressibility and flow on color Doppler imaging. Femoral Vein: No evidence of thrombus. Normal compressibility, respiratory phasicity and response to augmentation. Popliteal Vein: No evidence of thrombus. Normal compressibility, respiratory phasicity and response to augmentation. Calf Veins: No evidence of thrombus. Normal compressibility and flow on color Doppler imaging. Superficial Great Saphenous Vein: No evidence of thrombus. Normal compressibility. Venous Reflux:  None. Other Findings: Mild diffuse soft tissue/interstitial edema seen within the subcutaneous soft tissues. IMPRESSION: 1. No evidence of deep venous thrombosis in either lower extremity. 2. Mild diffuse soft tissue/interstitial edema within  the subcutaneous soft tissues of both lower extremities/calves. Electronically Signed   By: Jeannine Boga M.D.   On: 06/11/2019 01:16    ____________________________________________   PROCEDURES   Procedure(s) performed (including Critical Care):  .1-3 Lead EKG Interpretation Performed by: Hinda Kehr, MD Authorized by: Hinda Kehr, MD     Interpretation: abnormal     ECG rate:  115   ECG rate assessment: tachycardic     Rhythm: sinus tachycardia     Ectopy: none     Conduction: normal       ____________________________________________   INITIAL IMPRESSION / MDM / ASSESSMENT AND PLAN / ED COURSE  As part of my medical decision making, I reviewed the following data within the [facility] notes reviewed and incorporated, Labs reviewed , EKG interpreted , Old chart reviewed, Radiograph reviewed  and Notes from prior ED visits   Differential diagnosis includes, but is not limited to, chronic venous stasis, pulmonary embolism, DVT, cellulitis, bacteremia, strep or staph infection, heart failure, anasarca.  The patient's presentation is somewhat contradictory.  The labs obtained in triage demonstrate no leukocytosis and he has no fever but he has a lactic acid of 2.6.  However he is also tachypneic and having dyspnea and borderline hypoxemia so the lactic acid could be respiratory.  His legs could represent cellulitis but they appear more chronic and it would be unusual to have cellulitis in his left leg "jump" to his right leg suggesting bacteremia but having no other infectious symptoms.  His comprehensive metabolic panel is essentially normal.  I am evaluating the patient broadly.  Even though technically speaking he meets SIRS criteria, I do not think at this point that he is actually septic as I think that his lower extremity changes are more likely volume related and he may have a degree of heart failure leading to anasarca.  However I will  treat empirically with ceftriaxone 2 g IV and vancomycin per pharmacy consult while pursuing additional evaluation.  I have ordered bilateral lower extremity venous Dopplers to rule out DVT and given his tachycardia, tachypnea, and borderline hypoxemia as well as the lower extremity changes, a CTA chest has been ordered to rule out pulmonary embolism.  Of Lin his chest x-ray was unremarkable and did not show any acute infection nor  evidence of volume overload at this time.  High-sensitivity troponin is normal and there is no evidence of ischemia on his EKG.  I explained to the patient that anticipate admission given his presentation and his symptoms and vital sign abnormalities but he will require further work-up.  He understands the plan.  The patient is on the cardiac monitor to evaluate for evidence of arrhythmia and/or significant heart rate changes.     Clinical Course as of Jun 11 247  Fri Jun 11, 2019  0051 Great improvement on second lactate  Lactic Acid, Venous: 1.6 [CF]  0052 No acute abnormalities on CTA chest  CT Angio Chest PE W/Cm &/Or Wo Cm [CF]  0219 The patient's work-up has been generally reassuring with no sign of blood clots in his legs and no sign of pulmonary embolism or other acute or emergent abnormality in his lungs.  However he remains tachycardic at about 115 even at rest.I reassessed him and he says that he feels fine right now.  He is on 2 L of oxygen but that is because he has sleep apnea and when he just off to sleep his oxygen drops.  He already has an appointment as an outpatient to have a sleep study.  He says this is normal for him.I explained to him the results of his test to the best of my ability and explained I do not have a good reason for the swelling in his legs, that I am concerned he has an infection although I do not think that is the primary issue, and that his vital signs remain abnormal with a heart rate that is too fast.  I recommended that he be  admitted to the hospital for further evaluation.  He prefers to go home and pointed out that he has an appoint with his primary care doctor later today, in less than 12 hours.  He is comfortable with her and he said that she knows him well and can help take care of him.  He says that he suffers from anxiety and does not want to stay in the hospital.We discussed it for little bit including the risks and benefits and the possibility that he is going to get worse, but given that his work-up is generally reassuring and he has no sign of life-threatening blood clots, and the fact that he has the capacity make his own decisions, I will discharge him but explained to him that it is statically AGAINST MEDICAL ADVICE and he agreed to sign the AMA form.  I will start him on doxycycline since he has been on Keflex previously and strongly encouraged him to keep his appointment later today with his primary care doctor.  I also gave my usual and customary return precautions for him to come back to the nearest ED if he develops any new or worsening symptoms.  He understands and agrees with the plan.   [CF]  0224 On second thought, I am not going to initiate additional outpatient antibiotic therapy at this time.  His primary care doctor knows him well and has been following him for the leg swelling and the erythema and I do not think that he is suffering from cellulitis at this time.  He has no fever, no leukocytosis, his lactic acid corrected with only the fluid that was in the ceftriaxone.  I will defer to her judgment since she has been following him for the last month and he has broad-spectrum antibiotics in his system already.   [  CF]    Clinical Course User Index [CF] Loleta Rose, MD     ____________________________________________  FINAL CLINICAL IMPRESSION(S) / ED DIAGNOSES  Final diagnoses:  Peripheral edema  Tachycardia  Chronic venous stasis dermatitis of both lower extremities     MEDICATIONS GIVEN  DURING THIS VISIT:  Medications  cefTRIAXone (ROCEPHIN) 2 g in sodium chloride 0.9 % 100 mL IVPB (0 g Intravenous Stopped 06/11/19 0148)  vancomycin (VANCOREADY) IVPB 500 mg/100 mL (has no administration in time range)  iohexol (OMNIPAQUE) 350 MG/ML injection 75 mL (75 mLs Intravenous Contrast Given 06/10/19 2349)  vancomycin (VANCOREADY) IVPB 2000 mg/400 mL ( Intravenous Stopped 06/11/19 0224)     ED Discharge Orders    None      *Please Lin:  OBED SAMEK was evaluated in Emergency Department on 06/11/2019 for the symptoms described in the history of present illness. He was evaluated in the context of the global COVID-19 pandemic, which necessitated consideration that the patient might be at risk for infection with the SARS-CoV-2 virus that causes COVID-19. Institutional protocols and algorithms that pertain to the evaluation of patients at risk for COVID-19 are in a state of rapid change based on information released by regulatory bodies including the CDC and federal and state organizations. These policies and algorithms were followed during the patient's care in the ED.  Some ED evaluations and interventions may be delayed as a result of limited staffing during the pandemic.*  Lin:  This document was prepared using Dragon voice recognition software and may include unintentional dictation errors.   Loleta Rose, MD 06/11/19 [phone]

## 2019-06-10 NOTE — ED Triage Notes (Signed)
Pt to ED with continued pain, swelling and redness in his left leg from a previously treated cellulitis that never subsided. Pt now has swelling, pain and redness in his right leg as well. SOB also reported with oxygen saturation of 94% on RA. No hx of CHF.   No fever in triage but tachycardic at 127.

## 2019-06-10 NOTE — ED Notes (Signed)
Pt transported to CT ?

## 2019-06-11 ENCOUNTER — Ambulatory Visit (INDEPENDENT_AMBULATORY_CARE_PROVIDER_SITE_OTHER): Payer: PPO | Admitting: Family Medicine

## 2019-06-11 ENCOUNTER — Encounter: Payer: Self-pay | Admitting: Family Medicine

## 2019-06-11 ENCOUNTER — Other Ambulatory Visit: Payer: PPO

## 2019-06-11 ENCOUNTER — Emergency Department: Payer: PPO

## 2019-06-11 VITALS — BP 103/68 | HR 114 | Temp 98.5°F | Ht 76.5 in | Wt 333.2 lb

## 2019-06-11 DIAGNOSIS — L03116 Cellulitis of left lower limb: Secondary | ICD-10-CM

## 2019-06-11 DIAGNOSIS — R1900 Intra-abdominal and pelvic swelling, mass and lump, unspecified site: Secondary | ICD-10-CM

## 2019-06-11 DIAGNOSIS — R609 Edema, unspecified: Secondary | ICD-10-CM

## 2019-06-11 DIAGNOSIS — I5032 Chronic diastolic (congestive) heart failure: Secondary | ICD-10-CM

## 2019-06-11 DIAGNOSIS — R600 Localized edema: Secondary | ICD-10-CM | POA: Diagnosis not present

## 2019-06-11 DIAGNOSIS — R39198 Other difficulties with micturition: Secondary | ICD-10-CM

## 2019-06-11 DIAGNOSIS — R000 Tachycardia, unspecified: Secondary | ICD-10-CM | POA: Diagnosis not present

## 2019-06-11 LAB — LACTIC ACID, PLASMA: Lactic Acid, Venous: 1.6 mmol/L (ref 0.5–1.9)

## 2019-06-11 LAB — TROPONIN I (HIGH SENSITIVITY): Troponin I (High Sensitivity): 4 ng/L (ref <18)

## 2019-06-11 LAB — PROCALCITONIN: Procalcitonin: <0.1 ng/mL

## 2019-06-11 NOTE — Assessment & Plan Note (Signed)
Eval for secondary cause.. BNP, thyroid, CBC and CMET.

## 2019-06-11 NOTE — ED Notes (Signed)
Pt wanting to leave AMA.  Dr. York Cerise at bedside talked to patient about risks.  Patient to follow up with appointment with pcp at 2pm today.  Patient understands and will sign AMA form.

## 2019-06-11 NOTE — ED Notes (Signed)
Pt still in CT

## 2019-06-11 NOTE — Discharge Instructions (Signed)
As we discussed, in spite of your abnormal vital signs, your work-up tonight in the emergency department was generally reassuring.  You had an ultrasound of both of your legs that did not show sign of blood clots.  He had a CT scan of your chest that showed no sign of blood clots.  Your lab work looked good with no elevation of your white blood cell count and normal kidney function.  In spite of the redness and swelling in both of your legs, I think this is more due to something called chronic venous stasis dermatitis than an acute infection (cellulitis), but please tell your doctor that I gave you a dose of ceftriaxone 2 g IV during your initial work-up in the emergency department .  I recommended that you stay in the hospital given that your heart rate continues to be high, but you would prefer to go home and follow-up with your regular doctor.  You have the capacity to make your own decisions so we discharged you, but please follow-up with Dr. Ermalene Searing as scheduled later today.  If you develop any new or worsening symptoms, please return immediately to the nearest emergency department.

## 2019-06-11 NOTE — Assessment & Plan Note (Signed)
Treat with IM ceftriaxone and oral antibiotics. Close follow up.  Elevated foot and reduce edema to facilitate healing.

## 2019-06-11 NOTE — Patient Instructions (Addendum)
We will call to get th CT scan set up.  Take prescription to medical supply for thigh high compression hose.  Follow HR at home.

## 2019-06-11 NOTE — Assessment & Plan Note (Signed)
Likely poor control contributing to edema. Restart losartan. Follow BP.

## 2019-06-11 NOTE — Assessment & Plan Note (Signed)
May need further eval.. no clear fluid wave at this time.

## 2019-06-11 NOTE — Progress Notes (Signed)
Chief Complaint  Patient presents with  . Follow-up    Cellulits    History of Present Illness: HPI    44 year old male with chronic peripheral edema presents for follow up. He has been treated off and of in last several months for cellulitis and peripheral edema. At last OV changed from lasix to torsemide 60 mg daily.  ECHO 05/16/2019 60-65%  He had no increase in urine output on the torsemide.. he feels the swelling in continued to worsen. He started with swelling in left leg and  Now it is in both legs up to thighs.    Since last OV he was seen in ER for  Edema.  Note reviewed in detail.  He was found to be tachycardiac and elevated lactic acid.  EKG  Showed sinus tachycardia ( he has been tachycardiac at last several office visits)  ER MD felt no ongoing infection.. but covered with ceftriaxone   Blood culture negative so far.  Chest CT: no PE  [place]: no DVT   BNP normal at 25  Cr 1.08 nml Na and Potassium  wbc nml at 8.5  Wt Readings from Last 3 Encounters:  06/11/19 (!) 333 lb 8 oz (151.3 kg)  06/10/19 (!) 315 lb (142.9 kg)  06/04/19 (!) 314 lb (142.4 kg)   He states his heart rate is possibly due to his anxiety. No shortness of breath, no chest pain.   Low back pain when he is standing. No radiation of pain. Worse since carrying extra weight in ankles in last several months Legs feel weak. Feels like his UOP is decreased. (depsite on flomax)  MRI lumbar spine: Followed by Dr. Allena Katz IMPRESSION: 1. Mild lumbar degenerative change. 2. Small central disc protrusion L4-5 with mild subarticular stenosis bilaterally and mild left foraminal stenosis. 3. 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 If this is causing the urinary retention. Has not tried any med for pain.   This visit occurred during the SARS-CoV-2 public health emergency.  Safety protocols were in  place, including screening questions prior to the visit, additional usage of staff PPE, and extensive cleaning of exam room while observing appropriate contact time as indicated for disinfecting solutions.   COVID 19 screen:  No recent travel or known exposure to COVID19 The patient denies respiratory symptoms of COVID 19 at this time. The importance of social distancing was discussed today.     Review of Systems  Constitutional: Positive for malaise/fatigue. Negative for chills and fever.  HENT: Negative for congestion and ear pain.   Eyes: Negative for pain and redness.  Respiratory: Negative for cough and shortness of breath.   Cardiovascular: Positive for leg swelling. Negative for chest pain and palpitations.  Gastrointestinal: Negative for abdominal pain, blood in stool, constipation, diarrhea, nausea and vomiting.  Genitourinary: Negative for dysuria.  Musculoskeletal: Positive for back pain. Negative for falls and myalgias.  Skin: Negative for rash.  Neurological: Negative for dizziness.  Psychiatric/Behavioral: Negative for depression. The patient is not nervous/anxious.       Past Medical History:  Diagnosis Date  . Anxiety   . Chronic leg pain   . Depression   . Essential hypertension, benign 10/20/2015  . Schizoaffective disorder (HCC)     reports that he has been smoking cigarettes. He started smoking about 23 years ago. He has a 23.00 pack-year smoking history. He has  quit using smokeless tobacco. He reports that he does not drink alcohol or use drugs.   Current Outpatient Medications:  .  amantadine (SYMMETREL) 100 MG capsule, Take 100 mg by mouth 2 (two) times daily., Disp: , Rfl:  .  amphetamine-dextroamphetamine (ADDERALL) 20 MG tablet, Take 1 tablet by mouth in the morning, at noon, in the evening, and at bedtime. , Disp: , Rfl:  .  atorvastatin (LIPITOR) 40 MG tablet, TAKE 1 TABLET BY MOUTH EVERY DAY, Disp: 90 tablet, Rfl: 3 .  cephALEXin (KEFLEX) 500 MG  capsule, Take 1 capsule (500 mg total) by mouth 4 (four) times daily for 7 days., Disp: 28 capsule, Rfl: 0 .  diazepam (VALIUM) 10 MG tablet, Take 10 mg by mouth 4 (four) times daily., Disp: , Rfl:  .  gabapentin (NEURONTIN) 600 MG tablet, Take 1,200 mg by mouth 3 (three) times daily. , Disp: , Rfl:  .  losartan (COZAAR) 100 MG tablet, Take 0.5 tablets (50 mg total) by mouth daily., Disp: 45 tablet, Rfl: 1 .  omeprazole (PRILOSEC) 20 MG capsule, Take 20 mg by mouth daily as needed., Disp: , Rfl:  .  polyethylene glycol powder (MIRALAX) 17 GM/SCOOP powder, Take 17 g by mouth 2 (two) times daily as needed for moderate constipation., Disp: 255 g, Rfl: 0 .  potassium chloride (KLOR-CON) 10 MEQ tablet, TAKE 1 TABLET BY MOUTH EVERY DAY, Disp: 90 tablet, Rfl: 1 .  propranolol (INDERAL) 20 MG tablet, Take 20 mg by mouth 4 (four) times daily., Disp: , Rfl:  .  risperiDONE (RISPERDAL) 2 MG tablet, Take 2 mg by mouth at bedtime., Disp: , Rfl:  .  senna-docusate (SENOKOT-S) 8.6-50 MG tablet, Take 1 tablet by mouth 2 (two) times daily as needed for moderate constipation., Disp: 60 tablet, Rfl: 1 .  sulfamethoxazole-trimethoprim (BACTRIM) 400-80 MG tablet, Take 2 tablets by mouth 2 (two) times daily for 7 days., Disp: 28 tablet, Rfl: 0 .  tamsulosin (FLOMAX) 0.4 MG CAPS capsule, Take 1 capsule (0.4 mg total) by mouth daily., Disp: 90 capsule, Rfl: 3 .  tizanidine (ZANAFLEX) 2 MG capsule, TAKE 1 CAPSULE BY MOUTH 3 TIMES DAILY AS NEEDED FOR MUSCLE SPASMS., Disp: 270 capsule, Rfl: 0 .  torsemide (DEMADEX) 20 MG tablet, Take 3 tablets (60 mg total) by mouth daily., Disp: 30 tablet, Rfl: 0 .  Vitamin D, Ergocalciferol, (DRISDOL) 50000 units CAPS capsule, Take 1 capsule (50,000 Units total) by mouth 2 (two) times a week., Disp: , Rfl:    Observations/Objective: Blood pressure 103/68, pulse (!) 120, temperature 98.5 F (36.9 C), temperature source Temporal, height 6' 4.5" (1.943 m), weight (!) 333 lb 8 oz (151.3 kg),  SpO2 93 %.   Physical Exam Constitutional:      Appearance: He is well-developed.     Comments: Neck bent over  HENT:     Head: Normocephalic.     Right Ear: Hearing normal.     Left Ear: Hearing normal.     Nose: Nose normal.  Neck:     Thyroid: No thyroid mass or thyromegaly.     Vascular: No carotid bruit.     Trachea: Trachea normal.  Cardiovascular:     Rate and Rhythm: Normal rate and regular rhythm.     Pulses: Normal pulses.     Heart sounds: Heart sounds not distant. No murmur heard.  No friction rub. No gallop.      Comments: No peripheral edema Pulmonary:     Effort: Pulmonary effort is  normal. No respiratory distress.     Breath sounds: Normal breath sounds.  Abdominal:     General: Abdomen is protuberant. Bowel sounds are normal. There is distension.     Palpations: There is no shifting dullness or fluid wave.     Tenderness: There is no abdominal tenderness.  Skin:    General: Skin is warm and dry.     Findings: No rash.     Comments: Slight erythema decreasing in bilateral legs...   peripheral edema in bilateral legs.. still 2 plus pitting,  Psychiatric:        Speech: Speech normal.        Behavior: Behavior normal.        Thought Content: Thought content normal.      Assessment and Plan  Peripheral edema Negative evaluation so far.  Given unexplained abdominal distention and lower leg edema worsening despite diuretic.Marland Kitchen eval for abd mass/compression of lymph system with CT abd/pelvis  Cellulitis of left leg Resolved. Any remaining erythema likely due to peripheral edema and chronic venous stasis changes.  Swelling abdomen No clear cause. Unremarkable labs. Eval with CT scan.  Sinus tachycardia Chronic.. possibly due to anxiety. May need further cardiac eval.  Chronic diastolic heart failure (HCC) Cntinue torsemide at current dose.     Eliezer Lofts, MD

## 2019-06-14 ENCOUNTER — Ambulatory Visit
Admission: RE | Admit: 2019-06-14 | Discharge: 2019-06-14 | Disposition: A | Discharge status: Home / Self Care | Payer: PPO | Source: Ambulatory Visit | Attending: Family Medicine | Admitting: Family Medicine

## 2019-06-14 DIAGNOSIS — R609 Edema, unspecified: Secondary | ICD-10-CM

## 2019-06-14 DIAGNOSIS — R39198 Other difficulties with micturition: Secondary | ICD-10-CM

## 2019-06-14 DIAGNOSIS — R1900 Intra-abdominal and pelvic swelling, mass and lump, unspecified site: Secondary | ICD-10-CM

## 2019-06-14 DIAGNOSIS — R600 Localized edema: Secondary | ICD-10-CM

## 2019-06-14 DIAGNOSIS — K429 Umbilical hernia without obstruction or gangrene: Secondary | ICD-10-CM | POA: Diagnosis not present

## 2019-06-14 MED ORDER — IOPAMIDOL (ISOVUE-300) INJECTION 61%
125.0000 mL | Freq: Once | INTRAVENOUS | Status: AC | PRN
  Administered 2019-06-14: 125 mL via INTRAVENOUS

## 2019-06-15 LAB — CULTURE, BLOOD (ROUTINE X 2)
Culture: NO GROWTH
Culture: NO GROWTH
Special Requests: ADEQUATE
Special Requests: ADEQUATE

## 2019-07-02 DIAGNOSIS — R0683 Snoring: Secondary | ICD-10-CM | POA: Insufficient documentation

## 2019-07-02 DIAGNOSIS — N401 Enlarged prostate with lower urinary tract symptoms: Secondary | ICD-10-CM | POA: Insufficient documentation

## 2019-07-02 NOTE — Assessment & Plan Note (Signed)
Increase lasix to 80 mg daily ( can take lasix 40 mg in AM and again in 4 hours). Follow weights at home and urine output... call with measurements and your urine output in 3 days.   Unclear why left > right... if persistent with changes, may need to eval for compression of lymph system with CT abd/pelvis.

## 2019-07-02 NOTE — Assessment & Plan Note (Signed)
No clear infeciton.. redness at this point likely due to chronic swelling.

## 2019-07-02 NOTE — Assessment & Plan Note (Signed)
Followed by urology.   Continue flomax at once daily.Marland Kitchen if urine output is not increased after 2 weeks... plan to increase dose of flomax.

## 2019-07-02 NOTE — Assessment & Plan Note (Signed)
Likely sleep apnea. Eval with sleep study.. referral placed.

## 2019-07-09 ENCOUNTER — Other Ambulatory Visit: Payer: Self-pay | Admitting: Family Medicine

## 2019-07-12 ENCOUNTER — Institutional Professional Consult (permissible substitution): Payer: PPO | Admitting: Acute Care

## 2019-07-12 NOTE — Progress Notes (Deleted)
History of Present Illness Jeffrey Lin is a 44 y.o. male current every day smoker, referred to [facility] for evaluation of snoring and sleep per Dr. Diona Browner   07/12/2019  Test Results:  CBC Latest Ref Rng & Units 06/10/2019 05/28/2019 05/17/2019  WBC 4.0 - 10.5 K/uL 8.5 9.2 11.6(H)  Hemoglobin 13.0 - 17.0 g/dL 14.0 16.3 15.8  Hematocrit 39 - 52 % 42.8 47.1 47.7  Platelets 150 - 400 K/uL 217 253 207    BMP Latest Ref Rng & Units 06/10/2019 05/28/2019 05/17/2019  Glucose 70 - 99 mg/dL 174(H) 192(H) 101(H)  BUN 6 - 20 mg/dL 11 12 15   Creatinine 0.61 - 1.24 mg/dL 1.08 1.03 1.05  BUN/Creat Ratio 6 - 22 (calc) - - -  Sodium 135 - 145 mmol/L 137 136 138  Potassium 3.5 - 5.1 mmol/L 3.9 3.4(L) 4.1  Chloride 98 - 111 mmol/L 92(L) 91(L) 95(L)  CO2 22 - 32 mmol/L 33(H) 33(H) 31  Calcium 8.9 - 10.3 mg/dL 9.0 9.3 8.8(L)    BNP    Component Value Date/Time   BNP 25.0 06/10/2019 2158   BNP 16 05/07/2019 1714    ProBNP    Component Value Date/Time   PROBNP 17.0 02/29/2016 0906    PFT No results found for: FEV1PRE, FEV1POST, FVCPRE, FVCPOST, TLC, DLCOUNC, PREFEV1FVCRT, PSTFEV1FVCRT  CT Abdomen Pelvis W Contrast  Result Date: 06/14/2019 CLINICAL DATA:  Abdominal swelling for 3 weeks. EXAM: CT ABDOMEN AND PELVIS WITH CONTRAST TECHNIQUE: Multidetector CT imaging of the abdomen and pelvis was performed using the standard protocol following bolus administration of intravenous contrast. CONTRAST:  125 mL Isovue-300 COMPARISON:  None. FINDINGS: Lower chest: Lung bases are clear. Mild linear atelectasis in the LEFT lower lobe. Paraseptal emphysema in the LEFT lower lobe. Hepatobiliary: No focal hepatic lesion. No biliary duct dilatation. Gallbladder is normal. Common bile duct is normal. Pancreas: Pancreas is normal. No ductal dilatation. No pancreatic inflammation. Spleen: Normal spleen Adrenals/urinary tract: Adrenal glands and kidneys are normal. The ureters and bladder normal.  Stomach/Bowel: Stomach, small bowel, appendix, and cecum are normal. The colon and rectosigmoid colon are normal. Vascular/Lymphatic: Abdominal aorta is normal caliber. No periportal or retroperitoneal adenopathy. No pelvic adenopathy. Reproductive: Unremarkable Other: No ascites. Small fat filled umbilical hernia (sagittal image 123/12). Musculoskeletal: No aggressive osseous lesion. Unilateral pars defect on the RIGHT at L5. No subluxation. IMPRESSION: 1. No acute abdominal process. 2. Linear LEFT basilar atelectasis and paraseptal emphysema. 3. Small umbilical hernia. 4. No free fluid or ascites. 5. Unilateral pars defect at  L5. Electronically Signed   By: Jeffrey Lin M.D.   On: 06/14/2019 14:13     Past medical hx Past Medical History:  Diagnosis Date  . Anxiety   . Chronic leg pain   . Depression   . Essential hypertension, benign 10/20/2015  . Schizoaffective disorder ([place])      Social History   Tobacco Use  . Smoking status: Current Every Day Smoker    Packs/day: 1.00    Years: 23.00    Pack years: 23.00    Types: Cigarettes    Start date: 09/22/1995  . Smokeless tobacco: Former Network engineer  . Vaping Use: Never used  Substance Use Topics  . Alcohol use: No    Alcohol/week: 0.0 standard drinks  . Drug use: No    Mr.Jeffrey Lin reports that he has been smoking cigarettes. He started smoking about 23 years ago. He has a 23.00 pack-year smoking history. He has  quit using smokeless tobacco. He reports that he does not drink alcohol and does not use drugs.  Tobacco Cessation: Ready to quit: Not Answered Counseling given: Not Answered   Past surgical hx, Family hx, Social hx all reviewed.  Current Outpatient Medications on File Prior to Visit  Medication Sig  . amantadine (SYMMETREL) 100 MG capsule Take 100 mg by mouth 2 (two) times daily.  Marland Kitchen amphetamine-dextroamphetamine (ADDERALL) 20 MG tablet Take 1 tablet by mouth in the morning, at noon, in the evening, and at  bedtime.   Marland Kitchen atorvastatin (LIPITOR) 40 MG tablet TAKE 1 TABLET BY MOUTH EVERY DAY  . diazepam (VALIUM) 10 MG tablet Take 10 mg by mouth 4 (four) times daily.  Marland Kitchen gabapentin (NEURONTIN) 600 MG tablet Take 1,200 mg by mouth 3 (three) times daily.   Marland Kitchen losartan (COZAAR) 100 MG tablet Take 0.5 tablets (50 mg total) by mouth daily.  Marland Kitchen omeprazole (PRILOSEC) 20 MG capsule Take 20 mg by mouth daily as needed.  . polyethylene glycol powder (MIRALAX) 17 GM/SCOOP powder Take 17 g by mouth 2 (two) times daily as needed for moderate constipation.  . potassium chloride (KLOR-CON) 10 MEQ tablet TAKE 1 TABLET BY MOUTH EVERY DAY  . propranolol (INDERAL) 20 MG tablet Take 20 mg by mouth 4 (four) times daily.  . risperiDONE (RISPERDAL) 2 MG tablet Take 2 mg by mouth at bedtime.  . senna-docusate (SENOKOT-S) 8.6-50 MG tablet Take 1 tablet by mouth 2 (two) times daily as needed for moderate constipation.  . tamsulosin (FLOMAX) 0.4 MG CAPS capsule Take 1 capsule (0.4 mg total) by mouth daily.  . tizanidine (ZANAFLEX) 2 MG capsule TAKE 1 CAPSULE BY MOUTH 3 TIMES DAILY AS NEEDED FOR MUSCLE SPASMS.  Marland Kitchen torsemide (DEMADEX) 20 MG tablet Take 3 tablets (60 mg total) by mouth daily.  . Vitamin D, Ergocalciferol, (DRISDOL) 50000 units CAPS capsule Take 1 capsule (50,000 Units total) by mouth 2 (two) times a week.   No current facility-administered medications on file prior to visit.     Allergies  Allergen Reactions  . Toradol [Ketorolac Tromethamine] Swelling  . Lamotrigine Rash    Review Of Systems:  Constitutional:   No  weight loss, night sweats,  Fevers, chills, fatigue, or  lassitude.  HEENT:   No headaches,  Difficulty swallowing,  Tooth/dental problems, or  Sore throat,                No sneezing, itching, ear ache, nasal congestion, post nasal drip,   CV:  No chest pain,  Orthopnea, PND, swelling in lower extremities, anasarca, dizziness, palpitations, syncope.   GI  No heartburn, indigestion, abdominal  pain, nausea, vomiting, diarrhea, change in bowel habits, loss of appetite, bloody stools.   Resp: No shortness of breath with exertion or at rest.  No excess mucus, no productive cough,  No non-productive cough,  No coughing up of blood.  No change in color of mucus.  No wheezing.  No chest wall deformity  Skin: no rash or lesions.  GU: no dysuria, change in color of urine, no urgency or frequency.  No flank pain, no hematuria   MS:  No joint pain or swelling.  No decreased range of motion.  No back pain.  Psych:  No change in mood or affect. No depression or anxiety.  No memory loss.   Vital Signs There were no vitals taken for this visit.   Physical Exam:  General- No distress,  A&Ox3 ENT: No sinus tenderness,  TM clear, pale nasal mucosa, no oral exudate,no post nasal drip, no LAN Cardiac: S1, S2, regular rate and rhythm, no murmur Chest: No wheeze/ rales/ dullness; no accessory muscle use, no nasal flaring, no sternal retractions Abd.: Soft Non-tender Ext: No clubbing cyanosis, edema Neuro:  normal strength Skin: No rashes, warm and dry Psych: normal mood and behavior   Assessment/Plan  No problem-specific Assessment & Plan notes found for this encounter.    Bevelyn Ngo, NP 07/12/2019  1:45 PM

## 2019-07-27 ENCOUNTER — Encounter: Payer: Self-pay | Admitting: Family Medicine

## 2019-07-27 ENCOUNTER — Other Ambulatory Visit: Payer: Self-pay

## 2019-07-27 ENCOUNTER — Ambulatory Visit (INDEPENDENT_AMBULATORY_CARE_PROVIDER_SITE_OTHER): Payer: PPO | Admitting: Family Medicine

## 2019-07-27 VITALS — BP 140/86 | HR 129 | Temp 97.2°F | Ht 76.5 in | Wt 305.0 lb

## 2019-07-27 DIAGNOSIS — R609 Edema, unspecified: Secondary | ICD-10-CM

## 2019-07-27 DIAGNOSIS — R0609 Other forms of dyspnea: Secondary | ICD-10-CM

## 2019-07-27 DIAGNOSIS — R0600 Dyspnea, unspecified: Secondary | ICD-10-CM | POA: Diagnosis not present

## 2019-07-27 DIAGNOSIS — R600 Localized edema: Secondary | ICD-10-CM

## 2019-07-27 DIAGNOSIS — R000 Tachycardia, unspecified: Secondary | ICD-10-CM | POA: Diagnosis not present

## 2019-07-27 MED ORDER — TORSEMIDE 20 MG PO TABS: 60.0000 mg | ORAL_TABLET | Freq: Every day | ORAL | 1 refills | Status: DC

## 2019-07-27 NOTE — Patient Instructions (Addendum)
Awn.pply hydrating cream on legs Aquaphor or cetaphil.  Please stop at the lab to have labs drawn  We will set up cardiologist ASAP.  Continue torsemide and potassium for now.

## 2019-07-28 LAB — COMPREHENSIVE METABOLIC PANEL
ALT: 25 U/L (ref 0–53)
AST: 15 U/L (ref 0–37)
Albumin: 4.1 g/dL (ref 3.5–5.2)
Alkaline Phosphatase: 126 U/L — ABNORMAL HIGH (ref 39–117)
BUN: 9 mg/dL (ref 6–23)
CO2: 32 mEq/L (ref 19–32)
Calcium: 9.5 mg/dL (ref 8.4–10.5)
Chloride: 99 mEq/L (ref 96–112)
Creatinine, Ser: 0.96 mg/dL (ref 0.40–1.50)
GFR: 85.42 mL/min (ref >60.00)
Glucose, Bld: 100 mg/dL — ABNORMAL HIGH (ref 70–99)
Potassium: 3.8 mEq/L (ref 3.5–5.1)
Sodium: 138 mEq/L (ref 135–145)
Total Bilirubin: 0.4 mg/dL (ref 0.2–1.2)
Total Protein: 6.7 g/dL (ref 6.0–8.3)

## 2019-07-28 LAB — CBC WITH DIFFERENTIAL/PLATELET
Basophils Absolute: 0.1 10*3/uL (ref 0.0–0.1)
Basophils Relative: 0.7 % (ref 0.0–3.0)
Eosinophils Absolute: 0.2 10*3/uL (ref 0.0–0.7)
Eosinophils Relative: 2.3 % (ref 0.0–5.0)
HCT: 45.1 % (ref 39.0–52.0)
Hemoglobin: 15.2 g/dL (ref 13.0–17.0)
Lymphocytes Relative: 15.9 % (ref 12.0–46.0)
Lymphs Abs: 1.7 10*3/uL (ref 0.7–4.0)
MCHC: 33.6 g/dL (ref 30.0–36.0)
MCV: 95.8 fl (ref 78.0–100.0)
Monocytes Absolute: 0.7 10*3/uL (ref 0.1–1.0)
Monocytes Relative: 6.6 % (ref 3.0–12.0)
Neutro Abs: 8.1 10*3/uL — ABNORMAL HIGH (ref 1.4–7.7)
Neutrophils Relative %: 74.5 % (ref 43.0–77.0)
Platelets: 247 10*3/uL (ref 150.0–400.0)
RBC: 4.71 Mil/uL (ref 4.22–5.81)
RDW: 14.8 % (ref 11.5–15.5)
WBC: 10.9 10*3/uL — ABNORMAL HIGH (ref 4.0–10.5)

## 2019-07-28 LAB — BRAIN NATRIURETIC PEPTIDE: Pro B Natriuretic peptide (BNP): 37 pg/mL (ref 0.0–100.0)

## 2019-07-29 ENCOUNTER — Telehealth: Payer: Self-pay | Admitting: Family Medicine

## 2019-07-29 NOTE — Telephone Encounter (Signed)
Pt called and said someone called him but didn't leave a message. He would like a call back regarding lab results.

## 2019-07-30 ENCOUNTER — Ambulatory Visit (INDEPENDENT_AMBULATORY_CARE_PROVIDER_SITE_OTHER): Payer: PPO | Admitting: Cardiovascular Disease

## 2019-07-30 ENCOUNTER — Other Ambulatory Visit
Admission: RE | Admit: 2019-07-30 | Discharge: 2019-07-30 | Disposition: A | Discharge status: Home / Self Care | Payer: PPO | Source: Ambulatory Visit | Attending: Cardiovascular Disease | Admitting: Cardiovascular Disease

## 2019-07-30 ENCOUNTER — Other Ambulatory Visit: Payer: Self-pay

## 2019-07-30 ENCOUNTER — Encounter: Payer: Self-pay | Admitting: Cardiovascular Disease

## 2019-07-30 VITALS — BP 144/104 | HR 95 | Ht 78.0 in | Wt 301.5 lb

## 2019-07-30 DIAGNOSIS — I890 Lymphedema, not elsewhere classified: Secondary | ICD-10-CM | POA: Diagnosis not present

## 2019-07-30 DIAGNOSIS — E785 Hyperlipidemia, unspecified: Secondary | ICD-10-CM | POA: Diagnosis not present

## 2019-07-30 DIAGNOSIS — Z720 Tobacco use: Secondary | ICD-10-CM

## 2019-07-30 DIAGNOSIS — R000 Tachycardia, unspecified: Secondary | ICD-10-CM | POA: Diagnosis not present

## 2019-07-30 DIAGNOSIS — I10 Essential (primary) hypertension: Secondary | ICD-10-CM

## 2019-07-30 DIAGNOSIS — Z20822 Contact with and (suspected) exposure to covid-19: Secondary | ICD-10-CM | POA: Diagnosis not present

## 2019-07-30 DIAGNOSIS — I5032 Chronic diastolic (congestive) heart failure: Secondary | ICD-10-CM | POA: Diagnosis not present

## 2019-07-30 DIAGNOSIS — Z01812 Encounter for preprocedural laboratory examination: Secondary | ICD-10-CM | POA: Diagnosis not present

## 2019-07-30 NOTE — Patient Instructions (Signed)
Medication Instructions:  Your physician recommends that you continue on your current medications as directed. Please refer to the Current Medication list given to you today.  *If you need a refill on your cardiac medications before your next appointment, please call your pharmacy*   Lab Work: [facility] [employer] will need a COVID test today.  Please report to the [facility] medical arts building drive test site between 7SM-2LM.  If you have labs (blood work) drawn today and your tests are completely normal, you will receive your results only by: Marland Kitchen MyChart Message (if you have MyChart) OR . A paper copy in the mail If you have any lab test that is abnormal or we need to change your treatment, we will call you to review the results.   Testing/Procedures: Your physician has requested that you have a cardiac catheterization. Cardiac catheterization is used to diagnose and/or treat various heart conditions. Doctors may recommend this procedure for a number of different reasons. The most common reason is to evaluate chest pain. Chest pain can be a symptom of coronary artery disease (CAD), and cardiac catheterization can show whether plaque is narrowing or blocking your heart's arteries. This procedure is also used to evaluate the valves, as well as measure the blood flow and oxygen levels in different parts of your heart. For further information please visit [email or web address] Please follow instruction sheet, as given.     Follow-Up: At [facility], you and your health needs are our priority.  As part of our continuing mission to provide you with exceptional heart care, we have created designated Provider Care Teams.  These Care Teams include your primary Cardiologist (physician) and Advanced Practice Providers (APPs -  Physician Assistants and Nurse Practitioners) who all work together to provide you with the care you need, when you need it.  We recommend signing up for the patient portal called  "MyChart".  Sign up information is provided on this After Visit Summary.  MyChart is used to connect with patients for Virtual Visits (Telemedicine).  Patients are able to view lab/test results, encounter notes, upcoming appointments, etc.  Non-urgent messages can be sent to your provider as well.   To learn more about what you can do with MyChart, go to [email or web address].    Your next appointment:   4 week(s)  The format for your next appointment:   In Person  Provider:    You may see Dr. Kirke Corin or one of the following Advanced Practice Providers on your designated Care Team:    Nicolasa Ducking, NP  Eula Listen, PA-C  Marisue Ivan, PA-C    Other Instructions [facility] Cardiac Cath Instructions   You are scheduled for a Cardiac Cath on: Monday 08/02/19    Please arrive at 9:30 am on the day of your procedure  Please expect a call from our [facility] to pre-register you  Do not eat/drink anything after midnight  Someone will need to drive you home  It is recommended someone be with you for the first 24 hours after your procedure  Wear clothes that are easy to get on/off and wear slip on shoes if possible   Medications bring a current list of all medications with you   _X__ Do not take these medications before your procedure: Torsemide the morning of the procedure. You can take it later in the day   Day of your procedure: Arrive at the [facility] entrance.  Free valet service is available.  After entering  the [facility] please check-in at the registration desk (1st desk on your right) to receive your armband. After receiving your armband someone will escort you to the cardiac cath/special procedures waiting area.  The usual length of stay after your procedure is about 2 to 3 hours.  This can vary.  If you have any questions, please call our office at [phone], or you may call the cardiac cath lab at [facility] directly at [phone]

## 2019-07-30 NOTE — H&P (View-Only) (Signed)
  Cardiology Office Note   Date:  07/30/2019   ID:  Jeffrey Lin, DOB 09/19/1975, MRN [id number]  PCP:  Bedsole, Amy E, MD  Cardiologist:   Evalynne Locurto, MD   Chief Complaint  Patient presents with  . New Patient (Initial Visit)    Pt has rapid heartbeat/ swelling. Meds verbally reviewed w/ pt.      History of Present Illness: Jeffrey Lin is a 44 y.o. male who was referred by Dr. Bedsole for evaluation of sinus tachycardia and shortness of breath. He has history of essential hypertension, schizoaffective disorder, obesity and tobacco use.  He was seen by Dr. Ross in 2016 for atypical chest pain which was pleuritic and was felt to be musculoskeletal.  He had an echocardiogram done in 2018 which showed normal LV systolic and diastolic function. He was hospitalized at [facility] in March 2020 with respiratory failure thought to be due to pneumonia.  He was treated for sepsis at that time.  He had left pleural effusion which was treated with thoracentesis.  He did have metabolic encephalopathy at that time. He was rehospitalized in April 2021 with lower extremity cellulitis and edema.  BNP was normal.  Echocardiogram showed normal LV systolic function with no significant valvular abnormalities.  The right ventricle was dilated but the pulmonary pressure could not be estimated.  Lower extremity venous Doppler was negative for DVT.  He improved with antibiotics. He continues to have severe bilateral leg edema with blisters oozing fluids and significant stasis dermatitis.  He had gradual weight gain to a peak of 333 pounds but improved with torsemide and his current weight is 301.  He has been using knee-high support stockings since April with no significant improvement.  In addition, he elevates his legs frequently during the day.  He denies any chest pain but he has exertional dyspnea.  He was seen recently and was noted to be tachycardic with a heart rate of 129 bpm.  He has been on propranolol  for few years for restlessness.  This was prescribed by his psychiatrist.  He had recent labs done this week which showed normal BNP, unremarkable CBC with the exception of borderline leukocytosis at 10.9.  CMP showed normal renal function and normal albumin.  Past Medical History:  Diagnosis Date  . Anxiety   . Chronic leg pain   . Depression   . Essential hypertension, benign 10/20/2015  . Schizoaffective disorder (HCC)     Past Surgical History:  Procedure Laterality Date  . Block procedure at pain center  03/27/06  . Bone graft from hip to jaw  2004  . Bone graft right side of head to jaw  2006  . 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 Sphenopalatine ganglionic       Current Outpatient Medications  Medication Sig Dispense Refill  . amantadine (SYMMETREL) 100 MG capsule Take 100 mg by mouth 2 (two) times daily.    . amphetamine-dextroamphetamine (ADDERALL) 20 MG tablet Take 1 tablet by mouth in the morning, at noon, in the evening, and at bedtime.     . atorvastatin (LIPITOR) 40 MG tablet TAKE 1 TABLET BY MOUTH EVERY DAY 90 tablet 3  . diazepam (VALIUM) 10 MG tablet Take 10 mg by mouth 4 (four) times daily.    . gabapentin (NEURONTIN) 600 MG tablet Take 1,200 mg by mouth 3 (three) times daily.     . losartan (COZAAR) 100 MG tablet Take 0.5 tablets (50 mg   total) by mouth daily. 45 tablet 1  . omeprazole (PRILOSEC) 20 MG capsule Take 20 mg by mouth daily as needed.    . polyethylene glycol powder (MIRALAX) 17 GM/SCOOP powder Take 17 g by mouth 2 (two) times daily as needed for moderate constipation. 255 g 0  . potassium chloride (KLOR-CON) 10 MEQ tablet TAKE 1 TABLET BY MOUTH EVERY DAY 90 tablet 1  . propranolol (INDERAL) 20 MG tablet Take 20 mg by mouth 4 (four) times daily.    . risperiDONE (RISPERDAL) 2 MG tablet Take 2 mg by mouth at bedtime.    . senna-docusate (SENOKOT-S) 8.6-50 MG tablet Take 1 tablet by mouth 2 (two) times daily as needed for moderate constipation. 60  tablet 1  . tamsulosin (FLOMAX) 0.4 MG CAPS capsule Take 1 capsule (0.4 mg total) by mouth daily. 90 capsule 3  . tizanidine (ZANAFLEX) 2 MG capsule TAKE 1 CAPSULE BY MOUTH 3 TIMES DAILY AS NEEDED FOR MUSCLE SPASMS. 270 capsule 0  . torsemide (DEMADEX) 20 MG tablet Take 3 tablets (60 mg total) by mouth daily. 90 tablet 1  . Vitamin D, Ergocalciferol, (DRISDOL) 50000 units CAPS capsule Take 1 capsule (50,000 Units total) by mouth 2 (two) times a week.     No current facility-administered medications for this visit.    Allergies:   Toradol [ketorolac tromethamine] and Lamotrigine    Social History:  The patient  reports that he has been smoking cigarettes. He started smoking about 23 years ago. He has a 34.50 pack-year smoking history. He has quit using smokeless tobacco. He reports that he does not drink alcohol and does not use drugs.   Family History:  The patient's family history includes Anxiety disorder in his brother and mother; Bipolar disorder in his maternal grandfather and paternal grandfather; Depression in his brother and mother; Hyperlipidemia in his mother; Hypertension in his mother; Lung disease in his father; Schizophrenia in his maternal grandfather.    ROS:  Please see the history of present illness.   Otherwise, review of systems are positive for none.   All other systems are reviewed and negative.    PHYSICAL EXAM: VS:  BP (!) 144/104 (BP Location: Right Arm, Patient Position: Sitting, Cuff Size: Large)   Pulse 95   Ht 6\' 6"  (1.981 m)   Wt (!) 301 lb 8 oz (136.8 kg)   SpO2 95%   BMI 34.84 kg/m  , BMI Body mass index is 34.84 kg/m. GEN: Well nourished, well developed, in no acute distress  HEENT: normal  Neck: no  carotid bruits, or masses.  Jugular venous pressure is not well visualized. Cardiac: RRR; no murmurs, rubs, or gallops, severe bilateral leg edema with blisters and chronic stasis dermatitis. Respiratory:  clear to auscultation bilaterally, normal work of  breathing GI: soft, nontender, nondistended, + BS MS: no deformity or atrophy  Skin: warm and dry, no rash Neuro:  Strength and sensation are intact Psych: euthymic mood, full affect   EKG:  EKG is ordered today. The ekg ordered today demonstrates normal sinus rhythm with rightward axis.  No significant ST or T wave changes.   Recent Labs: 05/07/2019: TSH 1.00 05/17/2019: Magnesium 1.7 06/10/2019: B Natriuretic Peptide 25.0 07/27/2019: ALT 25; BUN 9; Creatinine, Ser 0.96; Hemoglobin 15.2; Platelets 247.0; Potassium 3.8; Pro B Natriuretic peptide (BNP) 37.0; Sodium 138    Lipid Panel    Component Value Date/Time   CHOL 178 08/05/2018 1003   TRIG 257.0 (H) 08/05/2018 1003   HDL  40.50 08/05/2018 1003   CHOLHDL 4 08/05/2018 1003   VLDL 51.4 (H) 08/05/2018 1003   LDLCALC 84 11/01/2015 1119   LDLDIRECT 111.0 08/05/2018 1003      Wt Readings from Last 3 Encounters:  07/30/19 (!) 301 lb 8 oz (136.8 kg)  07/27/19 (!) 305 lb (138.3 kg)  06/11/19 (!) 333 lb 4 oz (151.2 kg)        PAD Screen 07/30/2019  Previous PAD dx? No  Previous surgical procedure? No  Pain with walking? No  Feet/toe relief with dangling? No  Painful, non-healing ulcers? No  Extremities discolored? No      ASSESSMENT AND PLAN:  1.  Chronic diastolic heart failure: In spite of unremarkable echocardiogram, his right ventricle was mildly dilated and he has responded reasonably well to diuresis with torsemide with significant weight loss.  I suspect that the patient does have some degree of diastolic dysfunction and possibly pulmonary hypertension but we have not been able to prove this with noninvasive imaging.  I think it is important to accurately evaluate this and thus I recommend proceeding with a right heart catheterization to better evaluate his volume status and guide his therapy.  2.  Sinus tachycardia: He is not tachycardic today.  He is on propranolol and he mentioned that sometimes he misses a dose  given that it is 4 times daily.  We should consider switching propranolol to metoprolol.  Adderall might be contributing to tachycardia.  3.  Lower extremity lymphedema with severe chronic venous insufficiency and ulceration: The patient has been dealing with recurrent cellulitis, severe swelling and blisters with significant discomfort since April.  He has failed conservative measures so far including antibiotics, knee-high support stockings which she has been using on a regular basis and leg elevation during the day. I think the patient will require a lymphedema pump to help with his symptoms and will consider this in the near future.  4.  Essential hypertension, blood pressure is mildly elevated.  Currently on losartan and propranolol.  I will consider switching propranolol to metoprolol for more consistent coverage.  5.  Hyperlipidemia: Currently on atorvastatin.  6.  Tobacco use: Not ready to quit.   Disposition:   FU with me in 1 month  Signed,  Lorine Bears, MD  07/30/2019 9:15 AM    [facility]

## 2019-07-30 NOTE — Progress Notes (Signed)
Cardiology Office Note   Date:  07/30/2019   ID:  Jeffrey Lin, DOB 09/17/1975, MRN [id number]  PCP:  Excell Seltzer, MD  Cardiologist:   Lorine Bears, MD   Chief Complaint  Patient presents with  . New Patient (Initial Visit)    Pt has rapid heartbeat/ swelling. Meds verbally reviewed w/ pt.      History of Present Illness: Jeffrey Lin is a 44 y.o. male who was referred by Dr. Ermalene Searing for evaluation of sinus tachycardia and shortness of breath. He has history of essential hypertension, schizoaffective disorder, obesity and tobacco use.  He was seen by Dr. Tenny Craw in 2016 for atypical chest pain which was pleuritic and was felt to be musculoskeletal.  He had an echocardiogram done in 2018 which showed normal LV systolic and diastolic function. He was hospitalized at [facility] in March 2020 with respiratory failure thought to be due to pneumonia.  He was treated for sepsis at that time.  He had left pleural effusion which was treated with thoracentesis.  He did have metabolic encephalopathy at that time. He was rehospitalized in April 2021 with lower extremity cellulitis and edema.  BNP was normal.  Echocardiogram showed normal LV systolic function with no significant valvular abnormalities.  The right ventricle was dilated but the pulmonary pressure could not be estimated.  Lower extremity venous Doppler was negative for DVT.  He improved with antibiotics. He continues to have severe bilateral leg edema with blisters oozing fluids and significant stasis dermatitis.  He had gradual weight gain to a peak of 333 pounds but improved with torsemide and his current weight is 301.  He has been using knee-high support stockings since April with no significant improvement.  In addition, he elevates his legs frequently during the day.  He denies any chest pain but he has exertional dyspnea.  He was seen recently and was noted to be tachycardic with a heart rate of 129 bpm.  He has been on propranolol  for few years for restlessness.  This was prescribed by his psychiatrist.  He had recent labs done this week which showed normal BNP, unremarkable CBC with the exception of borderline leukocytosis at 10.9.  CMP showed normal renal function and normal albumin.  Past Medical History:  Diagnosis Date  . Anxiety   . Chronic leg pain   . Depression   . Essential hypertension, benign 10/20/2015  . Schizoaffective disorder [facility])     Past Surgical History:  Procedure Laterality Date  . Block procedure at pain center  03/27/06  . Bone graft from hip to jaw  2004  . Bone graft right side of head to jaw  2006  . 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 Sphenopalatine ganglionic       Current Outpatient Medications  Medication Sig Dispense Refill  . amantadine (SYMMETREL) 100 MG capsule Take 100 mg by mouth 2 (two) times daily.    Marland Kitchen amphetamine-dextroamphetamine (ADDERALL) 20 MG tablet Take 1 tablet by mouth in the morning, at noon, in the evening, and at bedtime.     Marland Kitchen atorvastatin (LIPITOR) 40 MG tablet TAKE 1 TABLET BY MOUTH EVERY DAY 90 tablet 3  . diazepam (VALIUM) 10 MG tablet Take 10 mg by mouth 4 (four) times daily.    Marland Kitchen gabapentin (NEURONTIN) 600 MG tablet Take 1,200 mg by mouth 3 (three) times daily.     Marland Kitchen losartan (COZAAR) 100 MG tablet Take 0.5 tablets (50 mg  total) by mouth daily. 45 tablet 1  . omeprazole (PRILOSEC) 20 MG capsule Take 20 mg by mouth daily as needed.    . polyethylene glycol powder (MIRALAX) 17 GM/SCOOP powder Take 17 g by mouth 2 (two) times daily as needed for moderate constipation. 255 g 0  . potassium chloride (KLOR-CON) 10 MEQ tablet TAKE 1 TABLET BY MOUTH EVERY DAY 90 tablet 1  . propranolol (INDERAL) 20 MG tablet Take 20 mg by mouth 4 (four) times daily.    . risperiDONE (RISPERDAL) 2 MG tablet Take 2 mg by mouth at bedtime.    . senna-docusate (SENOKOT-S) 8.6-50 MG tablet Take 1 tablet by mouth 2 (two) times daily as needed for moderate constipation. 60  tablet 1  . tamsulosin (FLOMAX) 0.4 MG CAPS capsule Take 1 capsule (0.4 mg total) by mouth daily. 90 capsule 3  . tizanidine (ZANAFLEX) 2 MG capsule TAKE 1 CAPSULE BY MOUTH 3 TIMES DAILY AS NEEDED FOR MUSCLE SPASMS. 270 capsule 0  . torsemide (DEMADEX) 20 MG tablet Take 3 tablets (60 mg total) by mouth daily. 90 tablet 1  . Vitamin D, Ergocalciferol, (DRISDOL) 50000 units CAPS capsule Take 1 capsule (50,000 Units total) by mouth 2 (two) times a week.     No current facility-administered medications for this visit.    Allergies:   Toradol [ketorolac tromethamine] and Lamotrigine    Social History:  The patient  reports that he has been smoking cigarettes. He started smoking about 23 years ago. He has a 34.50 pack-year smoking history. He has quit using smokeless tobacco. He reports that he does not drink alcohol and does not use drugs.   Family History:  The patient's family history includes Anxiety disorder in his brother and mother; Bipolar disorder in his maternal grandfather and paternal grandfather; Depression in his brother and mother; Hyperlipidemia in his mother; Hypertension in his mother; Lung disease in his father; Schizophrenia in his maternal grandfather.    ROS:  Please see the history of present illness.   Otherwise, review of systems are positive for none.   All other systems are reviewed and negative.    PHYSICAL EXAM: VS:  BP (!) 144/104 (BP Location: Right Arm, Patient Position: Sitting, Cuff Size: Large)   Pulse 95   Ht 6\' 6"  (1.981 m)   Wt (!) 301 lb 8 oz (136.8 kg)   SpO2 95%   BMI 34.84 kg/m  , BMI Body mass index is 34.84 kg/m. GEN: Well nourished, well developed, in no acute distress  HEENT: normal  Neck: no  carotid bruits, or masses.  Jugular venous pressure is not well visualized. Cardiac: RRR; no murmurs, rubs, or gallops, severe bilateral leg edema with blisters and chronic stasis dermatitis. Respiratory:  clear to auscultation bilaterally, normal work of  breathing GI: soft, nontender, nondistended, + BS MS: no deformity or atrophy  Skin: warm and dry, no rash Neuro:  Strength and sensation are intact Psych: euthymic mood, full affect   EKG:  EKG is ordered today. The ekg ordered today demonstrates normal sinus rhythm with rightward axis.  No significant ST or T wave changes.   Recent Labs: 05/07/2019: TSH 1.00 05/17/2019: Magnesium 1.7 06/10/2019: B Natriuretic Peptide 25.0 07/27/2019: ALT 25; BUN 9; Creatinine, Ser 0.96; Hemoglobin 15.2; Platelets 247.0; Potassium 3.8; Pro B Natriuretic peptide (BNP) 37.0; Sodium 138    Lipid Panel    Component Value Date/Time   CHOL 178 08/05/2018 1003   TRIG 257.0 (H) 08/05/2018 1003   HDL  40.50 08/05/2018 1003   CHOLHDL 4 08/05/2018 1003   VLDL 51.4 (H) 08/05/2018 1003   LDLCALC 84 11/01/2015 1119   LDLDIRECT 111.0 08/05/2018 1003      Wt Readings from Last 3 Encounters:  07/30/19 (!) 301 lb 8 oz (136.8 kg)  07/27/19 (!) 305 lb (138.3 kg)  06/11/19 (!) 333 lb 4 oz (151.2 kg)        PAD Screen 07/30/2019  Previous PAD dx? No  Previous surgical procedure? No  Pain with walking? No  Feet/toe relief with dangling? No  Painful, non-healing ulcers? No  Extremities discolored? No      ASSESSMENT AND PLAN:  1.  Chronic diastolic heart failure: In spite of unremarkable echocardiogram, his right ventricle was mildly dilated and he has responded reasonably well to diuresis with torsemide with significant weight loss.  I suspect that the patient does have some degree of diastolic dysfunction and possibly pulmonary hypertension but we have not been able to prove this with noninvasive imaging.  I think it is important to accurately evaluate this and thus I recommend proceeding with a right heart catheterization to better evaluate his volume status and guide his therapy.  2.  Sinus tachycardia: He is not tachycardic today.  He is on propranolol and he mentioned that sometimes he misses a dose  given that it is 4 times daily.  We should consider switching propranolol to metoprolol.  Adderall might be contributing to tachycardia.  3.  Lower extremity lymphedema with severe chronic venous insufficiency and ulceration: The patient has been dealing with recurrent cellulitis, severe swelling and blisters with significant discomfort since April.  He has failed conservative measures so far including antibiotics, knee-high support stockings which she has been using on a regular basis and leg elevation during the day. I think the patient will require a lymphedema pump to help with his symptoms and will consider this in the near future.  4.  Essential hypertension, blood pressure is mildly elevated.  Currently on losartan and propranolol.  I will consider switching propranolol to metoprolol for more consistent coverage.  5.  Hyperlipidemia: Currently on atorvastatin.  6.  Tobacco use: Not ready to quit.   Disposition:   FU with me in 1 month  Signed,  Lorine Bears, MD  07/30/2019 9:15 AM    [facility]

## 2019-07-31 LAB — CBC WITH DIFFERENTIAL/PLATELET
Basophils Absolute: 0.1 10*3/uL (ref 0.0–0.2)
Basos: 1 %
EOS (ABSOLUTE): 0.3 10*3/uL (ref 0.0–0.4)
Eos: 3 %
Hematocrit: 46.7 % (ref 37.5–51.0)
Hemoglobin: 15.9 g/dL (ref 13.0–17.7)
Immature Grans (Abs): 0 10*3/uL (ref 0.0–0.1)
Immature Granulocytes: 0 %
Lymphocytes Absolute: 3 10*3/uL (ref 0.7–3.1)
Lymphs: 32 %
MCH: 31.9 pg (ref 26.6–33.0)
MCHC: 34 g/dL (ref 31.5–35.7)
MCV: 94 fL (ref 79–97)
Monocytes Absolute: 0.6 10*3/uL (ref 0.1–0.9)
Monocytes: 7 %
Neutrophils Absolute: 5.4 10*3/uL (ref 1.4–7.0)
Neutrophils: 57 %
Platelets: 271 10*3/uL (ref 150–450)
RBC: 4.99 x10E6/uL (ref 4.14–5.80)
RDW: 13.3 % (ref 11.6–15.4)
WBC: 9.4 10*3/uL (ref 3.4–10.8)

## 2019-07-31 LAB — SARS CORONAVIRUS 2 (TAT 6-24 HRS): SARS Coronavirus 2: NEGATIVE

## 2019-07-31 LAB — BASIC METABOLIC PANEL
BUN/Creatinine Ratio: 7 — ABNORMAL LOW (ref 9–20)
BUN: 6 mg/dL (ref 6–24)
CO2: 26 mmol/L (ref 20–29)
Calcium: 9.5 mg/dL (ref 8.7–10.2)
Chloride: 100 mmol/L (ref 96–106)
Creatinine, Ser: 0.87 mg/dL (ref 0.76–1.27)
GFR calc Af Amer: 122 mL/min/{1.73_m2} (ref >59)
GFR calc non Af Amer: 106 mL/min/{1.73_m2} (ref >59)
Glucose: 93 mg/dL (ref 65–99)
Potassium: 4.4 mmol/L (ref 3.5–5.2)
Sodium: 140 mmol/L (ref 134–144)

## 2019-08-02 ENCOUNTER — Encounter
Admission: RE | Disposition: A | Discharge status: Home / Self Care | Payer: Self-pay | Source: Home / Self Care | Attending: Cardiovascular Disease

## 2019-08-02 ENCOUNTER — Ambulatory Visit
Admission: RE | Admit: 2019-08-02 | Discharge: 2019-08-02 | Disposition: A | Discharge status: Home / Self Care | Payer: PPO | Attending: Cardiovascular Disease | Admitting: Cardiovascular Disease

## 2019-08-02 ENCOUNTER — Other Ambulatory Visit: Payer: Self-pay

## 2019-08-02 ENCOUNTER — Encounter: Payer: Self-pay | Admitting: Cardiovascular Disease

## 2019-08-02 DIAGNOSIS — I872 Venous insufficiency (chronic) (peripheral): Secondary | ICD-10-CM | POA: Diagnosis not present

## 2019-08-02 DIAGNOSIS — F17210 Nicotine dependence, cigarettes, uncomplicated: Secondary | ICD-10-CM | POA: Insufficient documentation

## 2019-08-02 DIAGNOSIS — Z79899 Other long term (current) drug therapy: Secondary | ICD-10-CM | POA: Diagnosis not present

## 2019-08-02 DIAGNOSIS — E785 Hyperlipidemia, unspecified: Secondary | ICD-10-CM | POA: Diagnosis not present

## 2019-08-02 DIAGNOSIS — I272 Pulmonary hypertension, unspecified: Secondary | ICD-10-CM

## 2019-08-02 DIAGNOSIS — Z6834 Body mass index (BMI) 34.0-34.9, adult: Secondary | ICD-10-CM | POA: Diagnosis not present

## 2019-08-02 DIAGNOSIS — F419 Anxiety disorder, unspecified: Secondary | ICD-10-CM | POA: Insufficient documentation

## 2019-08-02 DIAGNOSIS — I890 Lymphedema, not elsewhere classified: Secondary | ICD-10-CM | POA: Diagnosis not present

## 2019-08-02 DIAGNOSIS — F259 Schizoaffective disorder, unspecified: Secondary | ICD-10-CM | POA: Diagnosis not present

## 2019-08-02 DIAGNOSIS — I5032 Chronic diastolic (congestive) heart failure: Secondary | ICD-10-CM | POA: Diagnosis not present

## 2019-08-02 DIAGNOSIS — R000 Tachycardia, unspecified: Secondary | ICD-10-CM | POA: Insufficient documentation

## 2019-08-02 DIAGNOSIS — I110 Hypertensive heart disease with heart failure: Secondary | ICD-10-CM | POA: Diagnosis not present

## 2019-08-02 DIAGNOSIS — E669 Obesity, unspecified: Secondary | ICD-10-CM | POA: Diagnosis not present

## 2019-08-02 HISTORY — PX: RIGHT HEART CATH AND CORONARY ANGIOGRAPHY: CATH118264

## 2019-08-02 SURGERY — RIGHT HEART CATH AND CORONARY ANGIOGRAPHY
Anesthesia: Moderate Sedation | Laterality: Right

## 2019-08-02 MED ORDER — SODIUM CHLORIDE 0.9 % IV SOLN
250.0000 mL | INTRAVENOUS | Status: DC | PRN
  Administered 2019-08-02: 250 mL via INTRAVENOUS

## 2019-08-02 MED ORDER — SODIUM CHLORIDE 0.9% FLUSH: 3.0000 mL | Freq: Two times a day (BID) | INTRAVENOUS | Status: DC

## 2019-08-02 MED ORDER — HEPARIN (PORCINE) IN NACL 1000-0.9 UT/500ML-% IV SOLN
INTRAVENOUS | Status: AC
  Filled 2019-08-02: qty 1000

## 2019-08-02 MED ORDER — HEPARIN (PORCINE) IN NACL 1000-0.9 UT/500ML-% IV SOLN
INTRAVENOUS | Status: DC | PRN
  Administered 2019-08-02: 500 mL

## 2019-08-02 MED ORDER — LIDOCAINE HCL (PF) 1 % IJ SOLN
INTRAMUSCULAR | Status: DC | PRN
  Administered 2019-08-02: 2 mL

## 2019-08-02 MED ORDER — SODIUM CHLORIDE 0.9 % IV SOLN: INTRAVENOUS | Status: DC

## 2019-08-02 MED ORDER — SODIUM CHLORIDE 0.9% FLUSH: 3.0000 mL | INTRAVENOUS | Status: DC | PRN

## 2019-08-02 MED ORDER — MIDAZOLAM HCL 2 MG/2ML IJ SOLN
INTRAMUSCULAR | Status: DC | PRN
  Administered 2019-08-02: 1 mg via INTRAVENOUS

## 2019-08-02 MED ORDER — MIDAZOLAM HCL 2 MG/2ML IJ SOLN
INTRAMUSCULAR | Status: AC
  Filled 2019-08-02: qty 2

## 2019-08-02 MED ORDER — FENTANYL CITRATE (PF) 100 MCG/2ML IJ SOLN
INTRAMUSCULAR | Status: AC
  Filled 2019-08-02: qty 2

## 2019-08-02 MED ORDER — TORSEMIDE 20 MG PO TABS: 40.0000 mg | ORAL_TABLET | Freq: Every day | ORAL | 1 refills | Status: DC

## 2019-08-02 MED ORDER — FENTANYL CITRATE (PF) 100 MCG/2ML IJ SOLN
INTRAMUSCULAR | Status: DC | PRN
  Administered 2019-08-02: 50 ug via INTRAVENOUS

## 2019-08-02 SURGICAL SUPPLY — 4 items
CATH BALLN WEDGE 5F 110CM (CATHETERS) ×3 IMPLANT
KIT MANI 3VAL PERCEP (MISCELLANEOUS) ×3 IMPLANT
PACK CARDIAC CATH (CUSTOM PROCEDURE TRAY) ×3 IMPLANT
SHEATH GLIDE SLENDER 4/5FR (SHEATH) ×3 IMPLANT

## 2019-08-02 NOTE — Telephone Encounter (Signed)
Spoke with Jeffrey Lin.  He states someone had called him to reschedule a his phone visit he had.  That was the call he missed.  He states he received his lab results already.

## 2019-08-02 NOTE — Interval H&P Note (Signed)
History and Physical Interval Note:  08/02/2019 11:36 AM  Jeffrey Lin  has presented today for surgery, with the diagnosis of RT Heart Cath   Chronic diastolic HF.  The various methods of treatment have been discussed with the patient and family. After consideration of risks, benefits and other options for treatment, the patient has consented to  Procedure(s): RIGHT HEART CATH AND CORONARY ANGIOGRAPHY (Right) as a surgical intervention.  The patient's history has been reviewed, patient examined, no change in status, stable for surgery.  I have reviewed the patient's chart and labs.  Questions were answered to the patient's satisfaction.     Lorine Bears

## 2019-08-06 DIAGNOSIS — R000 Tachycardia, unspecified: Secondary | ICD-10-CM | POA: Insufficient documentation

## 2019-08-06 NOTE — Assessment & Plan Note (Signed)
Cntinue torsemide at current dose.

## 2019-08-06 NOTE — Assessment & Plan Note (Signed)
Chronic.. possibly due to anxiety. May need further cardiac eval.

## 2019-08-06 NOTE — Assessment & Plan Note (Signed)
Negative evaluation so far.  Given unexplained abdominal distention and lower leg edema worsening despite diuretic.Marland Kitchen eval for abd mass/compression of lymph system with CT abd/pelvis

## 2019-08-06 NOTE — Assessment & Plan Note (Signed)
Resolved. Any remaining erythema likely due to peripheral edema and chronic venous stasis changes.

## 2019-08-06 NOTE — Assessment & Plan Note (Signed)
No clear cause. Unremarkable labs. Eval with CT scan.

## 2019-08-11 ENCOUNTER — Telehealth: Payer: Self-pay | Admitting: Family Medicine

## 2019-08-11 DIAGNOSIS — E559 Vitamin D deficiency, unspecified: Secondary | ICD-10-CM

## 2019-08-11 DIAGNOSIS — E7800 Pure hypercholesterolemia, unspecified: Secondary | ICD-10-CM

## 2019-08-11 DIAGNOSIS — Z1159 Encounter for screening for other viral diseases: Secondary | ICD-10-CM

## 2019-08-11 DIAGNOSIS — R7309 Other abnormal glucose: Secondary | ICD-10-CM

## 2019-08-11 NOTE — Telephone Encounter (Signed)
-----   Message from Terri J Walsh sent at 08/02/2019  2:51 PM EDT ----- Regarding: Lab orders for Thursday, 7.22.21 Patient is scheduled for CPX labs, please order future labs, Thanks , Terri  

## 2019-08-11 NOTE — Telephone Encounter (Signed)
-----   Message from Alvina Chou sent at 08/02/2019  2:51 PM EDT ----- Regarding: Lab orders for Thursday, 7.22.21 Patient is scheduled for CPX labs, please order future labs, Thanks , Camelia Eng

## 2019-08-12 ENCOUNTER — Other Ambulatory Visit: Payer: Self-pay

## 2019-08-12 ENCOUNTER — Other Ambulatory Visit (INDEPENDENT_AMBULATORY_CARE_PROVIDER_SITE_OTHER): Payer: PPO

## 2019-08-12 ENCOUNTER — Ambulatory Visit: Payer: PPO

## 2019-08-12 DIAGNOSIS — Z1159 Encounter for screening for other viral diseases: Secondary | ICD-10-CM

## 2019-08-12 DIAGNOSIS — E7800 Pure hypercholesterolemia, unspecified: Secondary | ICD-10-CM

## 2019-08-12 DIAGNOSIS — R7309 Other abnormal glucose: Secondary | ICD-10-CM | POA: Diagnosis not present

## 2019-08-12 LAB — COMPREHENSIVE METABOLIC PANEL
ALT: 37 U/L (ref 0–53)
AST: 24 U/L (ref 0–37)
Albumin: 4.2 g/dL (ref 3.5–5.2)
Alkaline Phosphatase: 127 U/L — ABNORMAL HIGH (ref 39–117)
BUN: 9 mg/dL (ref 6–23)
CO2: 34 mEq/L — ABNORMAL HIGH (ref 19–32)
Calcium: 9.7 mg/dL (ref 8.4–10.5)
Chloride: 97 mEq/L (ref 96–112)
Creatinine, Ser: 1.06 mg/dL (ref 0.40–1.50)
GFR: 76.17 mL/min (ref >60.00)
Glucose, Bld: 115 mg/dL — ABNORMAL HIGH (ref 70–99)
Potassium: 3.8 mEq/L (ref 3.5–5.1)
Sodium: 137 mEq/L (ref 135–145)
Total Bilirubin: 0.5 mg/dL (ref 0.2–1.2)
Total Protein: 7.5 g/dL (ref 6.0–8.3)

## 2019-08-12 LAB — LIPID PANEL
Cholesterol: 201 mg/dL — ABNORMAL HIGH (ref 0–200)
HDL: 32.4 mg/dL — ABNORMAL LOW (ref >39.00)
LDL Cholesterol: 131 mg/dL — ABNORMAL HIGH (ref 0–99)
NonHDL: 168.65
Total CHOL/HDL Ratio: 6
Triglycerides: 187 mg/dL — ABNORMAL HIGH (ref 0.0–149.0)
VLDL: 37.4 mg/dL (ref 0.0–40.0)

## 2019-08-12 LAB — HEMOGLOBIN A1C: Hgb A1c MFr Bld: 6.1 % (ref 4.6–6.5)

## 2019-08-12 NOTE — Progress Notes (Signed)
No critical labs need to be addressed urgently. We will discuss labs in detail at upcoming office visit.   

## 2019-08-13 LAB — HEPATITIS C ANTIBODY
Hepatitis C Ab: NONREACTIVE
SIGNAL TO CUT-OFF: 0.01 (ref <1.00)

## 2019-08-13 NOTE — Progress Notes (Signed)
No critical labs need to be addressed urgently. We will discuss labs in detail at upcoming office visit.   

## 2019-08-16 ENCOUNTER — Ambulatory Visit: Payer: PPO

## 2019-08-16 ENCOUNTER — Other Ambulatory Visit: Payer: Self-pay

## 2019-08-16 ENCOUNTER — Telehealth: Payer: Self-pay

## 2019-08-16 NOTE — Telephone Encounter (Signed)
Called patient 3 times trying to complete his Medicare visit. Patient never answered. Voicemail box was not setup. Unable to leave a message. Appointment was cancelled.

## 2019-08-19 ENCOUNTER — Encounter: Payer: PPO | Admitting: Family Medicine

## 2019-08-20 ENCOUNTER — Ambulatory Visit (INDEPENDENT_AMBULATORY_CARE_PROVIDER_SITE_OTHER): Payer: PPO | Admitting: Family Medicine

## 2019-08-20 ENCOUNTER — Other Ambulatory Visit: Payer: Self-pay

## 2019-08-20 ENCOUNTER — Encounter: Payer: Self-pay | Admitting: Family Medicine

## 2019-08-20 VITALS — BP 120/90 | HR 108 | Temp 98.0°F | Ht 75.5 in | Wt 277.2 lb

## 2019-08-20 DIAGNOSIS — F251 Schizoaffective disorder, depressive type: Secondary | ICD-10-CM

## 2019-08-20 DIAGNOSIS — E7800 Pure hypercholesterolemia, unspecified: Secondary | ICD-10-CM | POA: Diagnosis not present

## 2019-08-20 DIAGNOSIS — Z0000 Encounter for general adult medical examination without abnormal findings: Secondary | ICD-10-CM

## 2019-08-20 DIAGNOSIS — I10 Essential (primary) hypertension: Secondary | ICD-10-CM | POA: Diagnosis not present

## 2019-08-20 DIAGNOSIS — Z0001 Encounter for general adult medical examination with abnormal findings: Secondary | ICD-10-CM

## 2019-08-20 DIAGNOSIS — I5032 Chronic diastolic (congestive) heart failure: Secondary | ICD-10-CM | POA: Diagnosis not present

## 2019-08-20 NOTE — Patient Instructions (Addendum)
Start  flonase 2 sprays per nostril.  Make sure taking  Atorvastatin  Keep working on lowering cholesterol and sugar in diet.  Increase walking as much as able.  Start low back stretches.   Preventive Care 43-43 Years Old, Male Preventive care refers to lifestyle choices and visits with your health care provider that can promote health and wellness. This includes:  A yearly physical exam. This is also called an annual well check.  Regular dental and eye exams.  Immunizations.  Screening for certain conditions.  Healthy lifestyle choices, such as eating a healthy diet, getting regular exercise, not using drugs or products that contain nicotine and tobacco, and limiting alcohol use. What can I expect for my preventive care visit? Physical exam Your health care provider will check:  Height and weight. These may be used to calculate body mass index (BMI), which is a measurement that tells if you are at a healthy weight.  Heart rate and blood pressure.  Your skin for abnormal spots. Counseling Your health care provider may ask you questions about:  Alcohol, tobacco, and drug use.  Emotional well-being.  Home and relationship well-being.  Sexual activity.  Eating habits.  Work and work Statistician. What immunizations do I need?  Influenza (flu) vaccine  This is recommended every year. Tetanus, diphtheria, and pertussis (Tdap) vaccine  You may need a Td booster every 10 years. Varicella (chickenpox) vaccine  You may need this vaccine if you have not already been vaccinated. Zoster (shingles) vaccine  You may need this after age 36. Measles, mumps, and rubella (MMR) vaccine  You may need at least one dose of MMR if you were born in 1957 or later. You may also need a second dose. Pneumococcal conjugate (PCV13) vaccine  You may need this if you have certain conditions and were not previously vaccinated. Pneumococcal polysaccharide (PPSV23) vaccine  You may need  one or two doses if you smoke cigarettes or if you have certain conditions. Meningococcal conjugate (MenACWY) vaccine  You may need this if you have certain conditions. Hepatitis A vaccine  You may need this if you have certain conditions or if you travel or work in places where you may be exposed to hepatitis A. Hepatitis B vaccine  You may need this if you have certain conditions or if you travel or work in places where you may be exposed to hepatitis B. Haemophilus influenzae type b (Hib) vaccine  You may need this if you have certain risk factors. Human papillomavirus (HPV) vaccine  If recommended by your health care provider, you may need three doses over 6 months. You may receive vaccines as individual doses or as more than one vaccine together in one shot (combination vaccines). Talk with your health care provider about the risks and benefits of combination vaccines. What tests do I need? Blood tests  Lipid and cholesterol levels. These may be checked every 5 years, or more frequently if you are over 59 years old.  Hepatitis C test.  Hepatitis B test. Screening  Lung cancer screening. You may have this screening every year starting at age 72 if you have a 30-pack-year history of smoking and currently smoke or have quit within the past 15 years.  Prostate cancer screening. Recommendations will vary depending on your family history and other risks.  Colorectal cancer screening. All adults should have this screening starting at age 31 and continuing until age 47. Your health care provider may recommend screening at age 57 if you are  at increased risk. You will have tests every 1-10 years, depending on your results and the type of screening test.  Diabetes screening. This is done by checking your blood sugar (glucose) after you have not eaten for a while (fasting). You may have this done every 1-3 years.  Sexually transmitted disease (STD) testing. Follow these instructions at  home: Eating and drinking  Eat a diet that includes fresh fruits and vegetables, whole grains, lean protein, and low-fat dairy products.  Take vitamin and mineral supplements as recommended by your health care provider.  Do not drink alcohol if your health care provider tells you not to drink.  If you drink alcohol: ? Limit how much you have to 0-2 drinks a day. ? Be aware of how much alcohol is in your drink. In the [place] one drink equals one 12 oz bottle of beer (355 mL), one 5 oz glass of wine (148 mL), or one 1 oz glass of hard liquor (44 mL). Lifestyle  Take daily care of your teeth and gums.  Stay active. Exercise for at least 30 minutes on 5 or more days each week.  Do not use any products that contain nicotine or tobacco, such as cigarettes, e-cigarettes, and chewing tobacco. If you need help quitting, ask your health care provider.  If you are sexually active, practice safe sex. Use a condom or other form of protection to prevent STIs (sexually transmitted infections).  Talk with your health care provider about taking a low-dose aspirin every day starting at age 16. What's next?  Go to your health care provider once a year for a well check visit.  Ask your health care provider how often you should have your eyes and teeth checked.  Stay up to date on all vaccines. This information is not intended to replace advice given to you by your health care provider. Make sure you discuss any questions you have with your health care provider. Document Revised: 01/01/2018 Document Reviewed: 01/01/2018 Elsevier Patient Education  2020 [employer].

## 2019-08-20 NOTE — Progress Notes (Signed)
Chief Complaint  Patient presents with   Medicare Wellness    History of Present Illness: HPI   The patient presents for annual medicare wellness, complete physical and review of chronic health problems. He/She also has the following acute concerns today:  Some increase in postnasal drip,, clear, no face pain, no ear pain,  Mild cough with it. No fever. No SOB  I have personally reviewed the Medicare Annual Wellness questionnaire and have noted 1. The patient's medical and social history 2. Their use of alcohol, tobacco or illicit drugs 3. Their current medications and supplements 4. The patient's functional ability including ADL's, fall risks, home safety risks and hearing or visual             impairment. 5. Diet and physical activities 6. Evidence for depression or mood disorders 7.         Updated provider list Cognitive evaluation was performed and recorded on pt medicare questionnaire form. The patients weight, height, BMI and visual acuity have been recorded in the chart  I have made referrals, counseling and provided education to the patient based review of the above and I have provided the pt with a written personalized care plan for preventive services.   Documentation of this information was scanned into the electronic record under the media tab.   Advance directives and end of life planning reviewed in detail with patient and documented in EMR. Patient given handout on advance care directives if needed. HCPOA and living will updated if needed.    Office Visit from 08/20/2019 in [facility] [place]  PHQ-2 Total Score 0      Fall Risk  08/20/2019 01/08/2019 12/04/2018 09/01/2018 08/11/2018  Falls in the past year? 0 1 1 1  0  Number falls in past yr: - - 1 1 -  Comment - - - more than 10 -  Injury with Fall? - - 0 0 -  Risk Factor Category  - - - - -  Risk for fall due to : - - - - -  Follow up - - - - -     Hearing Screening   Method: Audiometry    125Hz  250Hz  500Hz  1000Hz  2000Hz  3000Hz  4000Hz  6000Hz  8000Hz   Right ear:   20 20 20   40    Left ear:   25 0 20  0      Visual Acuity Screening   Right eye Left eye Both eyes  Without correction: 20/30 20/30 2025  With correction:      Reviewed last OV from Dr. .  Chronic diastolic HF. Heart Catheterization 08/02/2019 No evidence of significant volume overload.  Decrease torsemide to 40 mg once daily. The patient does have mild pulmonary hypertension likely due to untreated sleep apnea.  Lower extremity lymphedema with severe chronic venous insufficiency and ulceration: : Considering lymphedema pump  Wt Readings from Last 3 Encounters:  08/20/19 (!) 277 lb 4 oz (125.8 kg)  08/02/19 (!) 305 lb (138.3 kg)  07/30/19 (!) 301 lb 8 oz (136.8 kg)   Hypertension:  Improved control on current regimen.   BP Readings from Last 3 Encounters:  08/20/19 (!) 120/90  08/02/19 (!) 139/95  07/30/19 (!) 144/104  Using medication without problems or lightheadedness: none Chest pain with exertion:none Edema: Fluid overload improved with torsemide. Short of breath: noen Average home BPs: Other issues:  Schizoaffective DO: stable per psychiatry.  Elevated Cholesterol:  On atorvastatin Lab Results  Component Value Date   CHOL 201 (  H) 08/12/2019   HDL 32.40 (L) 08/12/2019   LDLCALC 131 (H) 08/12/2019   LDLDIRECT 111.0 08/05/2018   TRIG 187.0 (H) 08/12/2019   CHOLHDL 6 08/12/2019  Using medications without problems: Muscle aches:  Diet compliance: moderate.. trying to decrease mountain dew. Exercise: minimla. Other complaints:   tobacco abuse: precontempative.  This visit occurred during the SARS-CoV-2 public health emergency.  Safety protocols were in place, including screening questions prior to the visit, additional usage of staff PPE, and extensive cleaning of exam room while observing appropriate contact time as indicated for disinfecting solutions.   COVID 19 screen:  No recent  travel or known exposure to COVID19 The patient denies respiratory symptoms of COVID 19 at this time. The importance of social distancing was discussed today.  Review of Systems  Constitutional: Negative for chills and fever.  HENT: Negative for congestion and ear pain.   Eyes: Negative for pain and redness.  Respiratory: Negative for cough and shortness of breath.   Cardiovascular: Negative for chest pain, palpitations and leg swelling.  Gastrointestinal: Negative for abdominal pain, blood in stool, constipation, diarrhea, nausea and vomiting.  Genitourinary: Negative for dysuria.  Musculoskeletal: Negative for falls and myalgias.  Skin: Negative for rash.  Neurological: Negative for dizziness.  Psychiatric/Behavioral: Negative for depression. The patient is not nervous/anxious.       Past Medical History:  Diagnosis Date   Anxiety    Chronic leg pain    Depression    Essential hypertension, benign 10/20/2015   Schizoaffective disorder (HCC)     reports that he has been smoking cigarettes. He started smoking about 23 years ago. He has a 34.50 pack-year smoking history. He has quit using smokeless tobacco. He reports that he does not drink alcohol and does not use drugs.   Current Outpatient Medications:    amantadine (SYMMETREL) 100 MG capsule, Take 100 mg by mouth 2 (two) times daily., Disp: , Rfl:    amphetamine-dextroamphetamine (ADDERALL) 20 MG tablet, Take 20 mg by mouth in the morning, at noon, in the evening, and at bedtime. , Disp: , Rfl:    atorvastatin (LIPITOR) 40 MG tablet, TAKE 1 TABLET BY MOUTH EVERY DAY, Disp: 90 tablet, Rfl: 3   diazepam (VALIUM) 10 MG tablet, Take 10 mg by mouth 4 (four) times daily., Disp: , Rfl:    gabapentin (NEURONTIN) 600 MG tablet, Take 1,200 mg by mouth 3 (three) times daily. , Disp: , Rfl:    losartan (COZAAR) 100 MG tablet, Take 0.5 tablets (50 mg total) by mouth daily., Disp: 45 tablet, Rfl: 1   omeprazole (PRILOSEC) 20 MG  capsule, Take 20 mg by mouth daily as needed., Disp: , Rfl:    polyethylene glycol powder (MIRALAX) 17 GM/SCOOP powder, Take 17 g by mouth 2 (two) times daily as needed for moderate constipation., Disp: 255 g, Rfl: 0   potassium chloride (KLOR-CON) 10 MEQ tablet, TAKE 1 TABLET BY MOUTH EVERY DAY, Disp: 90 tablet, Rfl: 1   propranolol (INDERAL) 20 MG tablet, Take 20 mg by mouth 4 (four) times daily., Disp: , Rfl:    risperiDONE (RISPERDAL) 2 MG tablet, Take 2 mg by mouth at bedtime., Disp: , Rfl:    senna-docusate (SENOKOT-S) 8.6-50 MG tablet, Take 1 tablet by mouth 2 (two) times daily as needed for moderate constipation., Disp: 60 tablet, Rfl: 1   tamsulosin (FLOMAX) 0.4 MG CAPS capsule, Take 1 capsule (0.4 mg total) by mouth daily., Disp: 90 capsule, Rfl: 3   tizanidine (ZANAFLEX)  2 MG capsule, TAKE 1 CAPSULE BY MOUTH 3 TIMES DAILY AS NEEDED FOR MUSCLE SPASMS., Disp: 270 capsule, Rfl: 0   torsemide (DEMADEX) 20 MG tablet, Take 2 tablets (40 mg total) by mouth daily., Disp: 90 tablet, Rfl: 1   Vitamin D, Ergocalciferol, (DRISDOL) 50000 units CAPS capsule, Take 1 capsule (50,000 Units total) by mouth 2 (two) times a week., Disp: , Rfl:    Observations/Objective: Blood pressure (!) 120/90, pulse (!) 108, temperature 98 F (36.7 C), temperature source Temporal, height 6' 3.5" (1.918 m), weight (!) 277 lb 4 oz (125.8 kg), SpO2 92 %.  Physical Exam Vitals and nursing note reviewed.  Constitutional:      Appearance: Normal appearance. He is obese. He is not ill-appearing.  Eyes:     Extraocular Movements: Extraocular movements intact.     Pupils: Pupils are equal, round, and reactive to light.  Cardiovascular:     Rate and Rhythm: Normal rate and regular rhythm.     Pulses: Normal pulses.     Heart sounds: No murmur heard.   Pulmonary:     Effort: Pulmonary effort is normal. No respiratory distress.     Breath sounds: Normal breath sounds. No wheezing, rhonchi or rales.  Abdominal:      General: Abdomen is flat. There is distension.     Palpations: Abdomen is soft. There is no mass.     Tenderness: There is no abdominal tenderness. There is no guarding or rebound.     Hernia: No hernia is present.     Comments: Abdomen less taut   Musculoskeletal:        General: No tenderness.     Right lower leg: Edema (1+ to knee) present.     Left lower leg: Edema (1+ to knee) present.  Skin:    General: Skin is warm and dry.     Findings: No erythema or rash.     Comments: Bilateral  lower leg with erythema   Neurological:     Mental Status: He is alert.  Psychiatric:        Mood and Affect: Mood normal.        Behavior: Behavior normal.      Assessment and Plan   The patient's preventative maintenance and recommended screening tests for an annual wellness exam were reviewed in full today. Brought up to date unless services declined.  Counselled on the importance of diet, exercise, and its role in overall health and mortality. The patient's FH and SH was reviewed, including their home life, tobacco status, and drug and alcohol status.   Vaccines:  Due for td, COVID19.  Discussed COVID19 vaccine side effects and benefits. Strongly encouraged the patient to get the vaccine. Questions answered. Refused both at this time. HIV neg: 3.2020 No early family history of colon cancer/prostate.  Smoker.. 1/2 pack a day now.. precontempative for cessation.  Essential hypertension, benign IMproved control on new regimen.  Schizoaffective disorder, depressive type (HCC) stable per psychiatry.  HYPERCHOLESTEROLEMIA Inadequate control on statin. Make sure taking atorvastatin regularly. Keep working on lowering cholesterol and sugar in diet.  Increase walking as much as able.   Chronic diastolic heart failure (HCC) Remains fluid overload but significant improvement with torsemide.    Kerby Nora, MD

## 2019-08-21 ENCOUNTER — Other Ambulatory Visit: Payer: Self-pay | Admitting: Family Medicine

## 2019-08-23 ENCOUNTER — Ambulatory Visit (INDEPENDENT_AMBULATORY_CARE_PROVIDER_SITE_OTHER): Payer: PPO | Admitting: Primary Care

## 2019-08-23 ENCOUNTER — Other Ambulatory Visit: Payer: Self-pay

## 2019-08-23 ENCOUNTER — Encounter: Payer: Self-pay | Admitting: Primary Care

## 2019-08-23 VITALS — BP 130/88 | HR 100 | Temp 98.0°F | Ht 75.5 in | Wt 278.8 lb

## 2019-08-23 DIAGNOSIS — F17200 Nicotine dependence, unspecified, uncomplicated: Secondary | ICD-10-CM | POA: Diagnosis not present

## 2019-08-23 DIAGNOSIS — R0683 Snoring: Secondary | ICD-10-CM | POA: Diagnosis not present

## 2019-08-23 NOTE — Patient Instructions (Addendum)
Pleasure meeting you today Jeffrey Lin  Orders: Home sleep study re: snoring/apnea   Recommendations: Work on maintaining healthy weight Side sleeping position is best  Do not take sedating medication prior to bedtime as these can worse sleep apnea Do not drive if experiencing excessive daytime fatigue  Tobacco abuse: Continue to work on quitting smoking, I know you can do this!!!! Recommend you enroll in lung cancer screening program when you are 56 (may change guidelines to age 56 in the next couple of years)  Follow-up: We will call you with results from sleep study about 1-2 weeks after completing  Recommend 3 months with APP      Sleep Apnea Sleep apnea affects breathing during sleep. It causes breathing to stop for a short time or to become shallow. It can also increase the risk of:  Heart attack.  Stroke.  Being very overweight (obese).  Diabetes.  Heart failure.  Irregular heartbeat. The goal of treatment is to help you breathe normally again. What are the causes? There are three kinds of sleep apnea:  Obstructive sleep apnea. This is caused by a blocked or collapsed airway.  Central sleep apnea. This happens when the brain does not send the right signals to the muscles that control breathing.  Mixed sleep apnea. This is a combination of obstructive and central sleep apnea. The most common cause of this condition is a collapsed or blocked airway. This can happen if:  Your throat muscles are too relaxed.  Your tongue and tonsils are too large.  You are overweight.  Your airway is too small. What increases the risk?  Being overweight.  Smoking.  Having a small airway.  Being older.  Being male.  Drinking alcohol.  Taking medicines to calm yourself (sedatives or tranquilizers).  Having family members with the condition. What are the signs or symptoms?  Trouble staying asleep.  Being sleepy or tired during the day.  Getting angry a  lot.  Loud snoring.  Headaches in the morning.  Not being able to focus your mind (concentrate).  Forgetting things.  Less interest in sex.  Mood swings.  Personality changes.  Feelings of sadness (depression).  Waking up a lot during the night to pee (urinate).  Dry mouth.  Sore throat. How is this diagnosed?  Your medical history.  A physical exam.  A test that is done when you are sleeping (sleep study). The test is most often done in a sleep lab but may also be done at home. How is this treated?   Sleeping on your side.  Using a medicine to get rid of mucus in your nose (decongestant).  Avoiding the use of alcohol, medicines to help you relax, or certain pain medicines (narcotics).  Losing weight, if needed.  Changing your diet.  Not smoking.  Using a machine to open your airway while you sleep, such as: ? An oral appliance. This is a mouthpiece that shifts your lower jaw forward. ? A CPAP device. This device blows air through a mask when you breathe out (exhale). ? An EPAP device. This has valves that you put in each nostril. ? A BPAP device. This device blows air through a mask when you breathe in (inhale) and breathe out.  Having surgery if other treatments do not work. It is important to get treatment for sleep apnea. Without treatment, it can lead to:  High blood pressure.  Coronary artery disease.  In men, not being able to have an erection (impotence).  Reduced thinking ability. Follow these instructions at home: Lifestyle  Make changes that your doctor recommends.  Eat a healthy diet.  Lose weight if needed.  Avoid alcohol, medicines to help you relax, and some pain medicines.  Do not use any products that contain nicotine or tobacco, such as cigarettes, e-cigarettes, and chewing tobacco. If you need help quitting, ask your doctor. General instructions  Take over-the-counter and prescription medicines only as told by your  doctor.  If you were given a machine to use while you sleep, use it only as told by your doctor.  If you are having surgery, make sure to tell your doctor you have sleep apnea. You may need to bring your device with you.  Keep all follow-up visits as told by your doctor. This is important. Contact a doctor if:  The machine that you were given to use during sleep bothers you or does not seem to be working.  You do not get better.  You get worse. Get help right away if:  Your chest hurts.  You have trouble breathing in enough air.  You have an uncomfortable feeling in your back, arms, or stomach.  You have trouble talking.  One side of your body feels weak.  A part of your face is hanging down. These symptoms may be an emergency. Do not wait to see if the symptoms will go away. Get medical help right away. Call your local emergency services (911 in the [place] Do not drive yourself to the hospital. Summary  This condition affects breathing during sleep.  The most common cause is a collapsed or blocked airway.  The goal of treatment is to help you breathe normally while you sleep. This information is not intended to replace advice given to you by your health care provider. Make sure you discuss any questions you have with your health care provider. Document Revised: 10/24/2017 Document Reviewed: 09/02/2017 Elsevier Patient Education  2020 [employer].

## 2019-08-23 NOTE — Assessment & Plan Note (Signed)
-   Patient has witnessed apnea and reports loud snoring - Needs HST to evaluate for underlying sleep apnea - Encourage patient maintained healthy weight (goal 250lb) and side sleeping position  - Discussed potential risk for untreated sleep apnea including cardiovascular disease, increase for daytime accidents and poor qualify of life  - FU after sleep study results/ 3 months

## 2019-08-23 NOTE — Assessment & Plan Note (Signed)
-   Patient has 40 pack year hx, continue to encourage he quit smoking. He has cut back from 2ppd to 0.5ppd - Recommend patient enroll in lung cancer screening program at age 44

## 2019-08-23 NOTE — Progress Notes (Signed)
@Patient  ID: , male    DOB: 07/29/1975, 44 y.o.   MRN: [id number]  Chief Complaint  Patient presents with  . sleep consult    per Amy Bedsole-- no prior sleep study. c/o loud snoring, occ restless sleep and daytime sleepiness.     Referring provider: [id number], MD  HPI: 44 year old male, current smoker (40 pack year hx+) . PMH significant for HTN, chronic diastolic heart failure, pleural effusin, peripheral neuropathy, hx DVT. Patient presents today for sleep consult by Dr. 55. States that when he was recently I the hospital it was noted that while he was sleeping his oxygen level would drop into the 80s. He has noticed several times at night where he stops breathing and wakes up short of breath. He is open to treatment if found to have sleep apnea. He is a current smoker, down to 1/2 pack a day for 1.5/2ppd . He started smoking 2 packs a day at age 49. He has great than 40 pack year hx. He has some dyspnea on exertion which doesn't bother him and has a cough with clear mucus production.   Sleep consult form:  Symptoms: Loud snoring, Choking/struggling to breath, Restless sleep Bedtime: 11pm-1am Wake up: 7-7am Nocturnal awakenings: 3 - 5 times a night Weight: 278 lbs Epworth: 8/24  Imaging: 06/10/19 CTA- Lungs/Pleura: There is atelectasis at the lung bases. There is no pneumothorax. No large pleural effusion. The trachea is unremarkable.  Allergies  Allergen Reactions  . Toradol [Ketorolac Tromethamine] Swelling  . Lamotrigine Rash    Immunization History  Administered Date(s) Administered  . Td 06/08/2009    Past Medical History:  Diagnosis Date  . Anxiety   . Chronic leg pain   . Depression   . Essential hypertension, benign 10/20/2015  . Schizoaffective disorder (HCC)     Tobacco History: Social History   Tobacco Use  Smoking Status Current Every Day Smoker  . Packs/day: 1.50  . Years: 23.00  . Pack years: 34.50  . Types: Cigarettes    . Start date: 09/22/1995  Smokeless Tobacco Former User  Tobacco Comment   Pt states that he is cutting back - currently at 1/2 PPD  (as of 07/27/19)   Ready to quit: Not Answered Counseling given: Not Answered Comment: Pt states that he is cutting back - currently at 1/2 PPD  (as of 07/27/19)   Outpatient Medications Prior to Visit  Medication Sig Dispense Refill  . amantadine (SYMMETREL) 100 MG capsule Take 100 mg by mouth 2 (two) times daily.    09/27/19 amphetamine-dextroamphetamine (ADDERALL) 20 MG tablet Take 20 mg by mouth in the morning, at noon, in the evening, and at bedtime.     Marland Kitchen atorvastatin (LIPITOR) 40 MG tablet TAKE 1 TABLET BY MOUTH EVERY DAY 90 tablet 3  . diazepam (VALIUM) 10 MG tablet Take 10 mg by mouth 4 (four) times daily.    Marland Kitchen gabapentin (NEURONTIN) 600 MG tablet Take 1,200 mg by mouth 3 (three) times daily.     Marland Kitchen losartan (COZAAR) 100 MG tablet Take 0.5 tablets (50 mg total) by mouth daily. 45 tablet 1  . omeprazole (PRILOSEC) 20 MG capsule Take 20 mg by mouth daily as needed.    . polyethylene glycol powder (MIRALAX) 17 GM/SCOOP powder Take 17 g by mouth 2 (two) times daily as needed for moderate constipation. 255 g 0  . potassium chloride (KLOR-CON) 10 MEQ tablet TAKE 1 TABLET BY MOUTH EVERY DAY 90  tablet 1  . propranolol (INDERAL) 20 MG tablet Take 20 mg by mouth 4 (four) times daily.    . risperiDONE (RISPERDAL) 2 MG tablet Take 2 mg by mouth at bedtime.    . senna-docusate (SENOKOT-S) 8.6-50 MG tablet Take 1 tablet by mouth 2 (two) times daily as needed for moderate constipation. 60 tablet 1  . tamsulosin (FLOMAX) 0.4 MG CAPS capsule Take 1 capsule (0.4 mg total) by mouth daily. 90 capsule 3  . tizanidine (ZANAFLEX) 2 MG capsule TAKE 1 CAPSULE BY MOUTH 3 TIMES DAILY AS NEEDED FOR MUSCLE SPASMS. 270 capsule 0  . torsemide (DEMADEX) 20 MG tablet Take 2 tablets (40 mg total) by mouth daily. 60 tablet 2  . Vitamin D, Ergocalciferol, (DRISDOL) 50000 units CAPS capsule Take 1  capsule (50,000 Units total) by mouth 2 (two) times a week.     No facility-administered medications prior to visit.    Review of Systems  Review of Systems  Constitutional: Negative.   Respiratory: Positive for cough and shortness of breath.   Psychiatric/Behavioral: Positive for sleep disturbance.    Physical Exam  BP (!) 130/88 (BP Location: Left Arm, Cuff Size: Large)   Pulse 100   Temp 98 F (36.7 C) (Temporal)   Ht 6' 3.5" (1.918 m)   Wt (!) 278 lb 12.8 oz (126.5 kg)   SpO2 95%   BMI 34.39 kg/m  Physical Exam Constitutional:      General: He is not in acute distress.    Appearance: Normal appearance. He is obese. He is not ill-appearing.  HENT:     Mouth/Throat:     Mouth: Mucous membranes are moist.     Pharynx: Oropharynx is clear.     Comments: Mallampati class II Cardiovascular:     Rate and Rhythm: Regular rhythm. Tachycardia present.  Pulmonary:     Effort: Pulmonary effort is normal.     Breath sounds: Normal breath sounds. No wheezing or rhonchi.  Musculoskeletal:        General: Normal range of motion.  Skin:    General: Skin is warm and dry.  Neurological:     General: No focal deficit present.     Mental Status: He is alert and oriented to person, place, and time. Mental status is at baseline.  Psychiatric:        Mood and Affect: Mood normal.        Behavior: Behavior normal.        Thought Content: Thought content normal.        Judgment: Judgment normal.      Lab Results:  CBC    Component Value Date/Time   WBC 9.4 07/30/2019 0956   WBC 10.9 (H) 07/27/2019 1630   RBC 4.99 07/30/2019 0956   RBC 4.71 07/27/2019 1630   HGB 15.9 07/30/2019 0956   HCT 46.7 07/30/2019 0956   PLT 271 07/30/2019 0956   MCV 94 07/30/2019 0956   MCV 92 09/11/2013 0406   MCH 31.9 07/30/2019 0956   MCH 31.8 06/10/2019 2158   MCHC 34.0 07/30/2019 0956   MCHC 33.6 07/27/2019 1630   RDW 13.3 07/30/2019 0956   RDW 12.8 09/11/2013 0406   LYMPHSABS 3.0  07/30/2019 0956   LYMPHSABS 3.3 09/11/2013 0406   MONOABS 0.7 07/27/2019 1630   MONOABS 0.4 09/11/2013 0406   EOSABS 0.3 07/30/2019 0956   EOSABS 0.1 09/11/2013 0406   BASOSABS 0.1 07/30/2019 0956   BASOSABS 0.0 09/11/2013 0406    BMET  Component Value Date/Time   NA 137 08/12/2019 0826   NA 140 07/30/2019 0956   NA 145 09/11/2013 0406   K 3.8 08/12/2019 0826   K 3.6 09/11/2013 0406   CL 97 08/12/2019 0826   CL 110 (H) 09/11/2013 0406   CO2 34 (H) 08/12/2019 0826   CO2 26 09/11/2013 0406   GLUCOSE 115 (H) 08/12/2019 0826   GLUCOSE 81 09/11/2013 0406   BUN 9 08/12/2019 0826   BUN 6 07/30/2019 0956   BUN 19 (H) 09/11/2013 0406   CREATININE 1.06 08/12/2019 0826   CREATININE 0.97 05/07/2019 1714   CALCIUM 9.7 08/12/2019 0826   CALCIUM 8.0 (L) 09/11/2013 0406   GFRNONAA 106 07/30/2019 0956   GFRNONAA >60 09/11/2013 0406   GFRAA 122 07/30/2019 0956   GFRAA >60 09/11/2013 0406    BNP    Component Value Date/Time   BNP 25.0 06/10/2019 2158   BNP 16 05/07/2019 1714    ProBNP    Component Value Date/Time   PROBNP 37.0 07/27/2019 1630    Imaging: CARDIAC CATHETERIZATION  Result Date: 08/02/2019 Successful right heart catheterization via the right antecubital vein. Normal filling pressures with mild pulmonary hypertension and high cardiac output. RA: 2 mmHg RV: 39 / 1 mmHg PCW: 7 mmHg PA: 39/15 with a mean of 26 mmHg Cardiac output: 9.1 with a cardiac index of 3.4 Recommendations: No evidence of significant volume overload.  Decrease torsemide to 40 mg once daily. The patient does have mild pulmonary hypertension likely due to untreated sleep apnea.    Assessment & Plan:   Snoring - Patient has witnessed apnea and reports loud snoring - Needs HST to evaluate for underlying sleep apnea - Encourage patient maintained healthy weight (goal 250lb) and side sleeping position  - Discussed potential risk for untreated sleep apnea including cardiovascular disease, increase  for daytime accidents and poor qualify of life  - FU after sleep study results/ 3 months   TOBACCO ABUSE - Patient has 40 pack year hx, continue to encourage he quit smoking. He has cut back from 2ppd to 0.5ppd - Recommend patient enroll in lung cancer screening program at age 14    Glenford Bayley, NP 08/23/2019

## 2019-08-25 ENCOUNTER — Ambulatory Visit: Payer: PPO | Admitting: Podiatry

## 2019-08-25 ENCOUNTER — Other Ambulatory Visit: Payer: Self-pay | Admitting: Family Medicine

## 2019-08-25 NOTE — Telephone Encounter (Signed)
Last office visit 07/302/021 for  [facility].  Last refilled 06/01/2019 for #[id number] with no refills.  No future appointments with PCP.

## 2019-08-27 ENCOUNTER — Ambulatory Visit: Payer: PPO | Admitting: Family

## 2019-08-30 NOTE — Progress Notes (Signed)
Reviewed and agree with assessment/plan.   Coralyn Helling, MD [facility] Pulmonary/Critical Care 08/30/2019, 7:12 AM Pager:  [phone]

## 2019-08-31 ENCOUNTER — Encounter: Payer: Self-pay | Admitting: Podiatry

## 2019-08-31 ENCOUNTER — Other Ambulatory Visit: Payer: Self-pay

## 2019-08-31 ENCOUNTER — Ambulatory Visit (INDEPENDENT_AMBULATORY_CARE_PROVIDER_SITE_OTHER): Payer: PPO | Admitting: Podiatry

## 2019-08-31 VITALS — Temp 96.8°F

## 2019-08-31 DIAGNOSIS — M79675 Pain in left toe(s): Secondary | ICD-10-CM | POA: Diagnosis not present

## 2019-08-31 DIAGNOSIS — L601 Onycholysis: Secondary | ICD-10-CM

## 2019-08-31 DIAGNOSIS — M79674 Pain in right toe(s): Secondary | ICD-10-CM | POA: Diagnosis not present

## 2019-08-31 DIAGNOSIS — B351 Tinea unguium: Secondary | ICD-10-CM

## 2019-08-31 DIAGNOSIS — I890 Lymphedema, not elsewhere classified: Secondary | ICD-10-CM

## 2019-08-31 DIAGNOSIS — S99922A Unspecified injury of left foot, initial encounter: Secondary | ICD-10-CM | POA: Diagnosis not present

## 2019-08-31 MED ORDER — MUPIROCIN 2 % EX OINT
1.0000 | TOPICAL_OINTMENT | Freq: Two times a day (BID) | CUTANEOUS | 0 refills | Status: DC
Start: 2019-08-31 — End: 2020-03-24

## 2019-08-31 NOTE — Patient Instructions (Signed)

## 2019-08-31 NOTE — Progress Notes (Signed)
  Subjective:  Patient ID: Jeffrey Lin, male    DOB: [birth year],  MRN: [id number]  Chief Complaint  Patient presents with  . Debridement    Bilateral nail trim; pt stated, "I had cellulitis in April 2021; I went to the hospital to get treated for this"  . Nail Problem    Left foot; Hallux; pt stated, "Not sure how nail became loose; started to bleed; needs nail checked"; x1 week    44 y.o. male presents with the above complaint. History confirmed with patient. Noticed blood in his socks recently and that the left hallux nail was loose. Doesn't recall a specific injury. Also has pain in the other toenails which are thick and discolored, he has worsening lymphedema.  Objective:  Physical Exam: warm, good capillary refill, DP reduced bilateral, no trophic changes or ulcerative lesions, normal sensory exam and PT reduced bilateral. Severe +3 diffuse pitting edema throughout the bilateral LE. Pulses are weakly palpable, secondary to edema Left Foot: hallux nail nearly completely avulsed, onychomycosis of 2-5, tender to palpation Right Foot: onychomycosis 1-5, tender to palpation    Assessment:   1. Onycholysis   2. Injury of toenail of left foot, initial encounter   3. Onychomycosis   4. Pain due to onychomycosis of toenails of both feet   5. Lymphedema      Plan:  Patient was evaluated and treated and all questions answered.  Discussed the etiology and treatment options for the condition in detail with the patient. Educated patient on the topical and oral treatment options for mycotic nails. Recommended debridement of the nails today. Sharp and mechanical debridement performed of all painful and mycotic nails today. Nails debrided in length and thickness using a nail nipper and a mechanical burr to level of comfort. Discussed treatment options including appropriate shoe gear. Follow up as needed for painful nails.   -Patient elects to proceed with ingrown toenail removal  today -Advised to reduce/cease smoking as much as possible during healing phases -Ingrown nail excised. See procedure note. -Educated on post-procedure care including soaking. Written instructions provided. -Patient to follow up in 2 weeks for nail check.  Procedure: Avulsion of toenail Location: Left 1st toe  Anesthesia: Lidocaine 1% plain; 1.5 mL and Marcaine 0.5% plain; 1.5 mL, digital block. Skin Prep: Betadine. Dressing: Silvadene; telfa; dry, sterile, compression dressing. Technique: Following skin prep, the toe was exsanguinated and a tourniquet was secured at the base of the toe. The nail was freed and avulsed with a hemostat. The area was cleansed. The tourniquet was then removed and sterile dressing applied. Disposition: Patient tolerated procedure well.    Return in about 2 weeks (around 09/14/2019) for nail re-check.

## 2019-09-01 NOTE — Progress Notes (Signed)
Office Visit    Patient Name: Jeffrey GillisBobby G Riedinger Date of Encounter: 09/02/2019  Primary Care Provider:  Excell SeltzerBedsole, Amy E, MD Primary Cardiologist:  Lorine BearsMuhammad Arida, MD Electrophysiologist:  None   Chief Complaint    Jeffrey Lin is a 44 y.o. male with a hx of lymphedema, venous insufficiency, HTN, mild pulmonary hypertension, diastolic heart failure, schizoaffective disorder, obesity, tobacco use presents today for follow-up after cardiac catheterization  Past Medical History    Past Medical History:  Diagnosis Date  . Anxiety   . Chronic leg pain   . Depression   . Essential hypertension, benign 10/20/2015  . Schizoaffective disorder [facility](HCC)    Past Surgical History:  Procedure Laterality Date  . Block procedure at pain center  03/27/06  . Bone graft from hip to jaw  2004  . Bone graft right side of head to jaw  2006  . 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 RIGHT HEART CATH AND CORONARY ANGIOGRAPHY Right 08/02/2019   Procedure: RIGHT HEART CATH AND CORONARY ANGIOGRAPHY;  Surgeon: Iran OuchArida, Muhammad A, MD;  Location: [facility] INVASIVE CV LAB;  Service: Cardiovascular;  Laterality: Right;  . Sphenopalatine ganglionic      Allergies  Allergies  Allergen Reactions  . Toradol [Ketorolac Tromethamine] Swelling  . Lamotrigine Rash    History of Present Illness    Jeffrey GillisBobby G Willig is a 44 y.o. male with a hx of lymphedema, venous insufficiency, HTN, mild pulmonary hypertension, diastolic heart failure, schizoaffective disorder, obesity, tobacco use last seen for cardiac catheterization 08/02/2019.  Seen in consult by Dr. Kirke CorinArida 07/30/2019 for tachycardia and shortness of breath.  Previously seen by Dr. Tenny Crawoss in 2016 for atypical chest pain which was pleuritic and felt to musculoskeletal.  Echo in 2018 with normal LV diastolic and systolic function.  [facility]ospital[facility] to [facility] 03/2018 with respiratory failure thought to be due to pneumonia.  Treated for sepsis at that time.  Noted left pleural  effusion treated with thoracentesis.  Metabolic encephalopathy at that time as well.  Rehospitalized April 2021 with lower extremity cellulitis and edema.  BNP normal.  Echo with normal LVEF with no significant valvular abnormalities.  RV dilated but pulmonary pressures cannot be elevated.  Lower extremity venous Doppler negative for DVT.  Improved with antibiotics.  When seen in consultation he noted continued bilateral leg edema with blisters oozing fluids and significant stasis dermatitis.  He had gradual weight gain with peak of 333 pounds but improved with torsemide to weight of 301 pounds.  He was recommended for right heart catheterization to evaluate volume status and guide therapy.  Seen in clinic 07/30/19 with noted continued severe swelling and blisters to bilateral lower extremity with lymphedema and severe chronic venous insufficiency.  He was not having improvement in symptoms despite using antibiotics, knee-high support stockings, leg elevation, walking exercise regimen.  Underwent right heart cath 08/02/2019 showing normal filling pressures with mild pulmonary hypertension high cardiac output.  No evidence of significant volume overload.  His torsemide was decreased to 40 mg daily.  He was noted to have mild pulmonary hypertension which was presumed due to untreated sleep apnea.  He has a sleep study this afternoon for treatment of likely sleep apnea in the setting of mild pulmonary hypertension.  Endorses eating low sodium diet and restricting his fluids. 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 While he is painting he is keeping his feet elevated on a footstool.  Reports no shortness of breath at rest.  Reports his dyspnea on exertion is stable at baseline. Reports no chest pain, pressure, or tightness. No orthopnea, PND. Reports no palpitations.   EKGs/Labs/Other Studies Reviewed:   The following studies were reviewed today:  Echo  05/16/19  1. Left ventricular ejection fraction, by estimation, is 60 to 65%. The  left ventricle has normal function. The left ventricle has no regional  wall motion abnormalities. Left ventricular diastolic parameters were  normal.   2. Right ventricular systolic function is moderately reduced. The right  ventricular size is moderately enlarged.   3. Right atrial size was moderately dilated.   4. The mitral valve is normal in structure. No evidence of mitral valve  regurgitation. No evidence of mitral stenosis.   5. The aortic valve is tricuspid. Aortic valve regurgitation is not  visualized. No aortic stenosis is present.   6. The inferior vena cava is normal in size with greater than 50%  respiratory variability, suggesting right atrial pressure of 3 mmHg.   RHC 07/2019 Successful right heart catheterization via the right antecubital vein. Normal filling pressures with mild pulmonary hypertension and high cardiac output.   RA: 2 mmHg RV: 39 / 1 mmHg PCW: 7 mmHg PA: 39/15 with a mean of 26 mmHg Cardiac output: 9.1 with a cardiac index of 3.4   Recommendations: No evidence of significant volume overload.  Decrease torsemide to 40 mg once daily. The patient does have mild pulmonary hypertension likely due to untreated sleep apnea.  EKG:  EKG is ordered today.  The ekg ordered today demonstrates normal sinus rhythm 88 bpm with nonspecific interventricular conduction delay.  No acute ST/T wave changes.  Recent Labs: 05/07/2019: TSH 1.00 05/17/2019: Magnesium 1.7 06/10/2019: B Natriuretic Peptide 25.0 07/27/2019: Pro B Natriuretic peptide (BNP) 37.0 07/30/2019: Hemoglobin 15.9; Platelets 271 08/12/2019: ALT 37; BUN 9; Creatinine, Ser 1.06; Potassium 3.8; Sodium 137  Recent Lipid Panel    Component Value Date/Time   CHOL 201 (H) 08/12/2019 0826   TRIG 187.0 (H) 08/12/2019 0826   HDL 32.40 (L) 08/12/2019 0826   CHOLHDL 6 08/12/2019 0826   VLDL 37.4 08/12/2019 0826   LDLCALC 131 (H)  08/12/2019 0826   LDLDIRECT 111.0 08/05/2018 1003    Home Medications   Current Meds  Medication Sig  . amantadine (SYMMETREL) 100 MG capsule Take 100 mg by mouth 2 (two) times daily.  Marland Kitchen amphetamine-dextroamphetamine (ADDERALL) 20 MG tablet Take 20 mg by mouth in the morning, at noon, in the evening, and at bedtime.   Marland Kitchen atorvastatin (LIPITOR) 40 MG tablet TAKE 1 TABLET BY MOUTH EVERY DAY  . diazepam (VALIUM) 10 MG tablet Take 10 mg by mouth 4 (four) times daily.  Marland Kitchen gabapentin (NEURONTIN) 600 MG tablet Take 1,200 mg by mouth 3 (three) times daily.   Marland Kitchen losartan (COZAAR) 100 MG tablet Take 0.5 tablets (50 mg total) by mouth daily. (Patient taking differently: Take 100 mg by mouth daily. )  . mupirocin ointment (BACTROBAN) 2 % Apply 1 application topically 2 (two) times daily. After soaks, to left great toenail site  . omeprazole (PRILOSEC) 20 MG capsule Take 20 mg by mouth daily as needed.  . polyethylene glycol powder (MIRALAX) 17 GM/SCOOP powder Take 17 g by mouth 2 (two) times daily as needed for moderate constipation.  . potassium chloride (KLOR-CON) 10 MEQ tablet TAKE 1 TABLET BY MOUTH EVERY DAY  . risperiDONE (RISPERDAL) 2 MG tablet Take 2 mg by mouth at bedtime.  Marland Kitchen  senna-docusate (SENOKOT-S) 8.6-50 MG tablet Take 1 tablet by mouth 2 (two) times daily as needed for moderate constipation.  . tamsulosin (FLOMAX) 0.4 MG CAPS capsule Take 1 capsule (0.4 mg total) by mouth daily.  . tizanidine (ZANAFLEX) 2 MG capsule TAKE 1 CAPSULE BY MOUTH 3 TIMES DAILY AS NEEDED FOR MUSCLE SPASMS.  Marland Kitchen torsemide (DEMADEX) 20 MG tablet Take 2 tablets (40 mg total) by mouth daily.  . Vitamin D, Ergocalciferol, (DRISDOL) 50000 units CAPS capsule Take 1 capsule (50,000 Units total) by mouth 2 (two) times a week.  . [DISCONTINUED] propranolol (INDERAL) 20 MG tablet Take 20 mg by mouth 4 (four) times daily.      Review of Systems      Review of Systems  Constitutional: Negative for chills, fever and  malaise/fatigue.  Cardiovascular: Positive for dyspnea on exertion and leg swelling. Negative for chest pain, near-syncope, orthopnea, palpitations and syncope.  Respiratory: Negative for cough, shortness of breath and wheezing.   Gastrointestinal: Negative for nausea and vomiting.  Neurological: Negative for dizziness, light-headedness and weakness.   All other systems reviewed and are otherwise negative except as noted above.  Physical Exam    VS:  BP (!) 142/98 (BP Location: Left Arm, Patient Position: Sitting, Cuff Size: Normal)   Pulse 88   Ht 6' 3.5" (1.918 m)   Wt 277 lb 6.4 oz (125.8 kg)   SpO2 96%   BMI 34.21 kg/m  , BMI Body mass index is 34.21 kg/m. GEN: Well nourished, overweight, well developed, in no acute distress. HEENT: normal. Neck: Supple, no JVD, carotid bruits, or masses. Cardiac: RRR, no murmurs, rubs, or gallops. No clubbing, cyanosis.   Radials/DP/PT 2+ and equal bilaterally. Severe bilateral leg edema (3+) with blisters and chronic stasis dermatitis. Noted thickened skin to bilateral LE consistent with lymphedema. Respiratory:  Respirations regular and unlabored, clear to auscultation bilaterally. GI: Soft, nontender, nondistended. MS: No deformity or atrophy. Skin: Warm and dry, no rash. R AC site from cath clean, dry, intact without ecchymosis, hematoma, nor signs of infection.  Neuro:  Strength and sensation are intact. Psych: Flat affect.  Assessment & Plan    1. Chronic diastolic heart failure/mild PAH - RHC 08/02/2019 with normal filling pressures with mild pulmonary hypertension and high cardiac output.  His torsemide was reduced to 40 mg daily.  He will continue Torsemide 40mg  daily and Potassium 10 mEq daily. He has sleep study this evening to evaluate for likely underlying OSA.  We reviewed the importance of compliance with CPAP if recommended and the implications for his cardiac function.  2. Sinus tachycardia - Not tachycardic on exam today.  Adderall likely contributory. Transition propranolol to coreg, as below which will hopefully allow for better regulation of HR and BP.  3. Lower extremity lymphedema severe chronic venous insufficiency and ulceration - Noted stage III-IV lymphedema. He has had recurrent cellulitis, severe 3+ lower extremity pitting edema, and blisters dating back to April 2021.  He has been evaluated for the symptoms 07/30/2019 and today 09/02/2019.  He has failed conservative measures including antibiotics, knee-high support stockings, leg elevation, walking regimen. Will order lymphedema pumps. He will continue conservative measures in the interim.  4. HTN - BP remains elevated. Stop Propranolol, Start Coreg 25mg  twice daily.   5. HLD - Lipid profile 08/12/2019 with LDL 131. Continue Atorvastatin.  Will likely benefit from increased dose of atorvastatin in the future.  Will defer to primary care provider.  6. Tobacco use - Presently working on  cutting back. Not yet ready to quit. Smoking cessation encouraged. Recommend utilization of 1800QUITNOW.  Disposition: Follow up in 4 week(s) with Dr. Kirke Corin or APP  Alver Sorrow, NP 09/02/2019, 10:38 AM

## 2019-09-02 ENCOUNTER — Other Ambulatory Visit: Payer: Self-pay

## 2019-09-02 ENCOUNTER — Encounter: Payer: Self-pay | Admitting: Family

## 2019-09-02 ENCOUNTER — Ambulatory Visit (INDEPENDENT_AMBULATORY_CARE_PROVIDER_SITE_OTHER): Payer: PPO | Admitting: Family

## 2019-09-02 ENCOUNTER — Ambulatory Visit: Payer: PPO

## 2019-09-02 VITALS — BP 142/98 | HR 88 | Ht 75.5 in | Wt 277.4 lb

## 2019-09-02 DIAGNOSIS — Z720 Tobacco use: Secondary | ICD-10-CM

## 2019-09-02 DIAGNOSIS — L97909 Non-pressure chronic ulcer of unspecified part of unspecified lower leg with unspecified severity: Secondary | ICD-10-CM

## 2019-09-02 DIAGNOSIS — I872 Venous insufficiency (chronic) (peripheral): Secondary | ICD-10-CM | POA: Diagnosis not present

## 2019-09-02 DIAGNOSIS — I890 Lymphedema, not elsewhere classified: Secondary | ICD-10-CM | POA: Diagnosis not present

## 2019-09-02 DIAGNOSIS — I5032 Chronic diastolic (congestive) heart failure: Secondary | ICD-10-CM | POA: Diagnosis not present

## 2019-09-02 DIAGNOSIS — E782 Mixed hyperlipidemia: Secondary | ICD-10-CM

## 2019-09-02 DIAGNOSIS — R0683 Snoring: Secondary | ICD-10-CM

## 2019-09-02 DIAGNOSIS — I10 Essential (primary) hypertension: Secondary | ICD-10-CM

## 2019-09-02 DIAGNOSIS — I83009 Varicose veins of unspecified lower extremity with ulcer of unspecified site: Secondary | ICD-10-CM | POA: Diagnosis not present

## 2019-09-02 MED ORDER — CARVEDILOL 25 MG PO TABS: 25.0000 mg | ORAL_TABLET | Freq: Two times a day (BID) | ORAL | 1 refills | Status: DC

## 2019-09-02 NOTE — Patient Instructions (Addendum)
Medication Instructions:  Your physician has recommended you make the following change in your medication:   STOP Propranolol  START Carvedilol 25mg  twice daily   *If you need a refill on your cardiac medications before your next appointment, please call your pharmacy*  Lab Work: No lab work today.  Testing/Procedures: Your EKG today shows normal sinus rhythm which is a regular result.   Follow-Up: At [facility], you and your health needs are our priority.  As part of our continuing mission to provide you with exceptional heart care, we have created designated Provider Care Teams.  These Care Teams include your primary Cardiologist (physician) and Advanced Practice Providers (APPs -  Physician Assistants and Nurse Practitioners) who all work together to provide you with the care you need, when you need it.  We recommend signing up for the patient portal called "MyChart".  Sign up information is provided on this After Visit Summary.  MyChart is used to connect with patients for Virtual Visits (Telemedicine).  Patients are able to view lab/test results, encounter notes, upcoming appointments, etc.  Non-urgent messages can be sent to your provider as well.   To learn more about what you can do with MyChart, go to [facility].    Your next appointment:   4 week(s)  The format for your next appointment:   In Person  Provider:   You may see [email or web address], MD or one of the following Advanced Practice Providers on your designated Care Team:    Lorine Bears, NP  Nicolasa Ducking, PA-C  Eula Listen, PA-C  Marisue Ivan, NP  Other Instructions  Continue low salt diet.  Recommend keeping your feet elevated as much as possible.

## 2019-09-06 ENCOUNTER — Telehealth: Payer: Self-pay | Admitting: *Deleted

## 2019-09-06 ENCOUNTER — Telehealth: Payer: Self-pay | Admitting: Primary Care

## 2019-09-06 NOTE — Telephone Encounter (Signed)
Please order in lab sleep study. He can bring tylenol pm or Zzquil with him to take if needed

## 2019-09-06 NOTE — Telephone Encounter (Signed)
Called and spoke to patient and relayed below message/recommendaitons.  Patient did not wish to proceed with in lab study. He stated that he would like to wait until a later date.   Will route to [facility] as a FYI.

## 2019-09-06 NOTE — Telephone Encounter (Signed)
Beth, please see below message and advise. Thanks 

## 2019-09-06 NOTE — Telephone Encounter (Signed)
On 09/02/19, successfully faxed order, demographics and office visits note to [facility] for lymphedema pumps.   Today, called representative, Natalia Leatherwood, to follow up on order and see if any other information is needed from [place]. No answer. Left message to call back.

## 2019-09-06 NOTE — Telephone Encounter (Signed)
Pt picked up the HST device on Thurs. 09/02/2019. Pt called back on Friday 09/03/2019 and stated that he was unable to go to sleep and could he keep it over the weekend.  Pt was advised that he could keep the device over the weekend and hopefully would be able to do this test within the 4 days pt had the device.   Pt returned the device on Monday 09/06/2019 and stated that he was still unable to preform the Home Sleep Study after 4 days/nights.    Please advise as to how to proceed. Pt advised that in lab sleep study may be ordered. Rhonda J Cobb

## 2019-09-06 NOTE — Telephone Encounter (Signed)
Ok thanks 

## 2019-09-07 NOTE — Telephone Encounter (Signed)
Received call from [facility]. Everything we sent to them was adequate. The last thing is a letter of Medical Necessity that they will be faxing over for the provider to sign.

## 2019-09-08 ENCOUNTER — Telehealth: Payer: Self-pay | Admitting: *Deleted

## 2019-09-08 NOTE — Telephone Encounter (Signed)
Patient called to schedule an appointment and was transferred to triage because of symptoms. Patient stated that he is concerned that he is getting cellulitis again.Patient stated that he is having some clear drainage coming out of his left leg. Patient stated that his leg is a little swollen and red. Patient denies a fever. Patient stated that he first noticed his leg problem Monday. Patient scheduled to see Dr. Ermalene Searing tomorrow at 9:40 am. Patient was given ER precautions and he verbalized understanding. Patient stated that he went to the hospital back in April for cellulitis and he does not feel that he needs to go to the ER now.

## 2019-09-09 ENCOUNTER — Encounter: Payer: Self-pay | Admitting: Family Medicine

## 2019-09-09 ENCOUNTER — Other Ambulatory Visit: Payer: Self-pay

## 2019-09-09 ENCOUNTER — Ambulatory Visit (INDEPENDENT_AMBULATORY_CARE_PROVIDER_SITE_OTHER): Payer: PPO | Admitting: Family Medicine

## 2019-09-09 VITALS — BP 142/88 | HR 109 | Temp 98.0°F | Ht 75.5 in | Wt 263.0 lb

## 2019-09-09 DIAGNOSIS — L97919 Non-pressure chronic ulcer of unspecified part of right lower leg with unspecified severity: Secondary | ICD-10-CM | POA: Diagnosis not present

## 2019-09-09 DIAGNOSIS — I890 Lymphedema, not elsewhere classified: Secondary | ICD-10-CM | POA: Diagnosis not present

## 2019-09-09 DIAGNOSIS — L97929 Non-pressure chronic ulcer of unspecified part of left lower leg with unspecified severity: Secondary | ICD-10-CM | POA: Diagnosis not present

## 2019-09-09 MED ORDER — CEPHALEXIN 500 MG PO CAPS: 500.0000 mg | ORAL_CAPSULE | Freq: Three times a day (TID) | ORAL | 0 refills | Status: DC

## 2019-09-09 NOTE — Telephone Encounter (Signed)
Noted  

## 2019-09-09 NOTE — Progress Notes (Signed)
Chief Complaint  Patient presents with  . Cellulitis    think it both leg , swelling , drainage    History of Present Illness: HPI    44 year old male with history of recurrent cellulitis in left lower leg, bilateral peripheral edema in setting of CHF presents with new onset   Bilateral leg sores and draining of lower legs.. noted in last 3 days.  Occ stinging pain in legs.  Swelling has not really worsened...using torsemide 2 daily.. causing diuresis of fluids. No fever.   Wt Readings from Last 3 Encounters:  09/09/19 263 lb (119.3 kg)  09/02/19 277 lb 6.4 oz (125.8 kg)  08/23/19 (!) 278 lb 12.8 oz (126.5 kg)    Cardiology  Is in process of looking into lymphedema pumps.     This visit occurred during the SARS-CoV-2 public health emergency.  Safety protocols were in place, including screening questions prior to the visit, additional usage of staff PPE, and extensive cleaning of exam room while observing appropriate contact time as indicated for disinfecting solutions.   COVID 19 screen:  No recent travel or known exposure to COVID19 The patient denies respiratory symptoms of COVID 19 at this time. The importance of social distancing was discussed today.     ROS    Past Medical History:  Diagnosis Date  . Anxiety   . Chronic leg pain   . Depression   . Essential hypertension, benign 10/20/2015  . Schizoaffective disorder (HCC)     reports that he has been smoking cigarettes. He started smoking about 23 years ago. He has a 34.50 pack-year smoking history. He has quit using smokeless tobacco. He reports that he does not drink alcohol and does not use drugs.   Current Outpatient Medications:  .  amantadine (SYMMETREL) 100 MG capsule, Take 100 mg by mouth 2 (two) times daily., Disp: , Rfl:  .  amphetamine-dextroamphetamine (ADDERALL) 20 MG tablet, Take 20 mg by mouth in the morning, at noon, in the evening, and at bedtime. , Disp: , Rfl:  .  atorvastatin (LIPITOR) 40 MG  tablet, TAKE 1 TABLET BY MOUTH EVERY DAY, Disp: 90 tablet, Rfl: 3 .  carvedilol (COREG) 25 MG tablet, Take 1 tablet (25 mg total) by mouth 2 (two) times daily., Disp: 180 tablet, Rfl: 1 .  diazepam (VALIUM) 10 MG tablet, Take 10 mg by mouth 4 (four) times daily., Disp: , Rfl:  .  gabapentin (NEURONTIN) 600 MG tablet, Take 1,200 mg by mouth 3 (three) times daily. , Disp: , Rfl:  .  losartan (COZAAR) 100 MG tablet, Take 0.5 tablets (50 mg total) by mouth daily. (Patient taking differently: Take 100 mg by mouth daily. ), Disp: 45 tablet, Rfl: 1 .  mupirocin ointment (BACTROBAN) 2 %, Apply 1 application topically 2 (two) times daily. After soaks, to left great toenail site, Disp: 30 g, Rfl: 0 .  omeprazole (PRILOSEC) 20 MG capsule, Take 20 mg by mouth daily as needed., Disp: , Rfl:  .  polyethylene glycol powder (MIRALAX) 17 GM/SCOOP powder, Take 17 g by mouth 2 (two) times daily as needed for moderate constipation., Disp: 255 g, Rfl: 0 .  potassium chloride (KLOR-CON) 10 MEQ tablet, TAKE 1 TABLET BY MOUTH EVERY DAY, Disp: 90 tablet, Rfl: 1 .  risperiDONE (RISPERDAL) 2 MG tablet, Take 2 mg by mouth at bedtime., Disp: , Rfl:  .  senna-docusate (SENOKOT-S) 8.6-50 MG tablet, Take 1 tablet by mouth 2 (two) times daily as needed for moderate  constipation., Disp: 60 tablet, Rfl: 1 .  tamsulosin (FLOMAX) 0.4 MG CAPS capsule, Take 1 capsule (0.4 mg total) by mouth daily., Disp: 90 capsule, Rfl: 3 .  tizanidine (ZANAFLEX) 2 MG capsule, TAKE 1 CAPSULE BY MOUTH 3 TIMES DAILY AS NEEDED FOR MUSCLE SPASMS., Disp: 270 capsule, Rfl: 0 .  torsemide (DEMADEX) 20 MG tablet, Take 2 tablets (40 mg total) by mouth daily., Disp: 60 tablet, Rfl: 2 .  Vitamin D, Ergocalciferol, (DRISDOL) 50000 units CAPS capsule, Take 1 capsule (50,000 Units total) by mouth 2 (two) times a week., Disp: , Rfl:    Observations/Objective: Blood pressure (!) 142/88, pulse (!) 109, temperature 98 F (36.7 C), temperature source Temporal, height 6'  3.5" (1.918 m), weight 263 lb (119.3 kg), SpO2 96 %.  Physical Exam Constitutional:      Appearance: He is well-developed.  HENT:     Head: Normocephalic.     Right Ear: Hearing normal.     Left Ear: Hearing normal.     Nose: Nose normal.  Neck:     Thyroid: No thyroid mass or thyromegaly.     Vascular: No carotid bruit.     Trachea: Trachea normal.  Cardiovascular:     Rate and Rhythm: Normal rate and regular rhythm.     Pulses: Normal pulses.     Heart sounds: Heart sounds not distant. No murmur heard.  No friction rub. No gallop.      Comments: No peripheral edema Pulmonary:     Effort: Pulmonary effort is normal. No respiratory distress.     Breath sounds: Normal breath sounds.  Skin:    General: Skin is warm and dry.     Findings: No rash.  Psychiatric:        Speech: Speech normal.        Behavior: Behavior normal.        Thought Content: Thought content normal.      Assessment and Plan   Bilateral leg ulcers , peripheral edema likely from lymphedema and possible cellulitis.   Will treat with antibiotics, elevation.. refer to wound center for possible [employer] application.  Lymphedema pump in process.  Continue current dose of torsemide.  Reviewed recent cardiology notes.   Kerby Nora, MD

## 2019-09-09 NOTE — Patient Instructions (Addendum)
Elevate legs above heart.  Complete antibiotics.  Keep areas clean and dry as able. Use nonstick dressing and wrap gauze to cover. We will set you up in wound care clinic.

## 2019-09-10 NOTE — Telephone Encounter (Signed)
Letter of medical necessity received from [facility] signed by Gillian Shields, NP and faxed back. Fax confirmation received.

## 2019-09-14 ENCOUNTER — Ambulatory Visit (INDEPENDENT_AMBULATORY_CARE_PROVIDER_SITE_OTHER): Payer: PPO | Admitting: Podiatry

## 2019-09-14 ENCOUNTER — Other Ambulatory Visit: Payer: Self-pay

## 2019-09-14 ENCOUNTER — Encounter: Payer: Self-pay | Admitting: Podiatry

## 2019-09-14 VITALS — Temp 97.9°F

## 2019-09-14 DIAGNOSIS — Z9889 Other specified postprocedural states: Secondary | ICD-10-CM

## 2019-09-14 DIAGNOSIS — S99922D Unspecified injury of left foot, subsequent encounter: Secondary | ICD-10-CM

## 2019-09-14 NOTE — Progress Notes (Signed)
  Subjective:  Patient ID: Jeffrey Lin, male    DOB: 03/17/1975,  MRN: [id number]  Chief Complaint  Patient presents with  . Nail check    Follow-up; Left foot; Hallux; pt stated, "Toenail does not hurt; please check 2nd toenail-it is going into my big toe and I saw some blood"; since Sunday    44 y.o. male presents with the above complaint. History confirmed with patient. Toenail is doing well. Some blood under 2nd toenail as well now. He has cellulitis and lymphedema, PCP is managing and going to wound care center soon.  Objective:  Physical Exam: warm, good capillary refill, DP reduced bilateral, no trophic changes or ulcerative lesions, normal sensory exam and PT reduced bilateral. Severe +3 diffuse pitting edema throughout the bilateral LE. Pulses are weakly palpable, secondary to edema. Cellulitis around ankle, the leg is dressed and wrapped.  Hallux nail is healing well with new nail formation proximal. 2nd toenail with some blood underneath, well adhered   Assessment:   1. Injury of toenail of left foot, subsequent encounter   2. Post-operative state      Plan:  Patient was evaluated and treated and all questions answered.  -Nail avulsion site has healed well  -Encouraged to continue to follow-up with his PCP in the wound care center for his lymphedema and cellulitis   Return if symptoms worsen or fail to improve.

## 2019-09-20 ENCOUNTER — Other Ambulatory Visit: Payer: Self-pay

## 2019-09-20 ENCOUNTER — Other Ambulatory Visit
Admission: RE | Admit: 2019-09-20 | Discharge: 2019-09-20 | Disposition: A | Discharge status: Home / Self Care | Payer: PPO | Source: Ambulatory Visit | Attending: Physician Assistant | Admitting: Physician Assistant

## 2019-09-20 ENCOUNTER — Encounter: Payer: PPO | Attending: Physician Assistant | Admitting: Physician Assistant

## 2019-09-20 DIAGNOSIS — L97821 Non-pressure chronic ulcer of other part of left lower leg limited to breakdown of skin: Secondary | ICD-10-CM | POA: Insufficient documentation

## 2019-09-20 DIAGNOSIS — I890 Lymphedema, not elsewhere classified: Secondary | ICD-10-CM | POA: Insufficient documentation

## 2019-09-20 DIAGNOSIS — I10 Essential (primary) hypertension: Secondary | ICD-10-CM | POA: Insufficient documentation

## 2019-09-20 DIAGNOSIS — B999 Unspecified infectious disease: Secondary | ICD-10-CM | POA: Diagnosis not present

## 2019-09-20 DIAGNOSIS — Z880 Allergy status to penicillin: Secondary | ICD-10-CM | POA: Diagnosis not present

## 2019-09-20 DIAGNOSIS — Z881 Allergy status to other antibiotic agents status: Secondary | ICD-10-CM | POA: Insufficient documentation

## 2019-09-20 DIAGNOSIS — L97511 Non-pressure chronic ulcer of other part of right foot limited to breakdown of skin: Secondary | ICD-10-CM | POA: Diagnosis not present

## 2019-09-20 DIAGNOSIS — Z836 Family history of other diseases of the respiratory system: Secondary | ICD-10-CM | POA: Diagnosis not present

## 2019-09-20 DIAGNOSIS — Z833 Family history of diabetes mellitus: Secondary | ICD-10-CM | POA: Insufficient documentation

## 2019-09-20 DIAGNOSIS — L97811 Non-pressure chronic ulcer of other part of right lower leg limited to breakdown of skin: Secondary | ICD-10-CM | POA: Diagnosis not present

## 2019-09-20 DIAGNOSIS — Z8249 Family history of ischemic heart disease and other diseases of the circulatory system: Secondary | ICD-10-CM | POA: Diagnosis not present

## 2019-09-22 NOTE — Progress Notes (Addendum)
KENG, JEWEL ([id number]) Visit Report for 09/20/2019 Chief Complaint Document Details Patient Name: Jeffrey Lin, Jeffrey Lin Date of Service: 09/20/2019 9:15 AM Medical Record Number: [id number] Patient Account Number: [id number] Date of Birth/Sex: [birth year] (44 y.o. M) Treating RN: Huel Coventry Primary Care Provider: Kerby Nora Other Clinician: Referring Provider: Kerby Nora Treating Provider/Extender: Linwood Dibbles, Sabeen Piechocki Weeks in Treatment: 0 Information Obtained from: Patient Chief Complaint Bilateral LE Lymphedema Electronic Signature(s) Signed: 09/20/2019 10:19:23 AM By: Lenda Kelp PA-C Entered By: Lenda Kelp on 09/20/2019 10:19:23 ORLANDA, LEMMERMAN ([id number]) -------------------------------------------------------------------------------- HPI Details Patient Name: Jeffrey Lin Date of Service: 09/20/2019 9:15 AM Medical Record Number: [id number] Patient Account Number: [id number] Date of Birth/Sex: [birth year] (44 y.o. M) Treating RN: Huel Coventry Primary Care Provider: Kerby Nora Other Clinician: Referring Provider: Kerby Nora Treating Provider/Extender: Linwood Dibbles, Ott Zimmerle Weeks in Treatment: 0 History of Present Illness HPI Description: 09/20/2019 upon evaluation today patient presents for initial evaluation in our clinic concerning issues that he has been having with wounds over the bilateral lower extremities. Both legs seem to be fairly equally affected at this point based on what I am seeing. With that being said the patient also appears to be having some drainage which is somewhat blue/green in nature and does have me concerned about the possibility of infection. I explained this often can indicate a Pseudomonas infection despite the fact that the patient has been on antibiotics currently this could still be problematic. He currently does not have home health that is something that we did discussed the potential for looking into. I think that could be beneficial for  him to be honest. The patient seems somewhat frustrated with the situation in general. He does have a history of lymphedema, hypertension, and he does ambulate and walk though not a lot and tells me that he really does not drive unless is absolutely forced for something he absolutely has to do otherwise he is essentially homebound at this point. He did have a believe his mother with him today. Electronic Signature(s) Signed: 09/20/2019 5:32:48 PM By: Lenda Kelp PA-C Entered By: Lenda Kelp on 09/20/2019 17:32:48 CHIMA, ASTORINO ([id number]) -------------------------------------------------------------------------------- Physical Exam Details Patient Name: Jeffrey Lin Date of Service: 09/20/2019 9:15 AM Medical Record Number: [id number] Patient Account Number: [id number] Date of Birth/Sex: 1975-06-09 (44 y.o. M) Treating RN: Huel Coventry Primary Care Provider: Kerby Nora Other Clinician: Referring Provider: Kerby Nora Treating Provider/Extender: Linwood Dibbles, Makih Stefanko Weeks in Treatment: 0 Constitutional patient is hypertensive.. pulse regular and within target range for patient.Marland Kitchen respirations regular, non-labored and within target range for patient.Marland Kitchen temperature within target range for patient.. Well-nourished and well-hydrated in no acute distress. Eyes conjunctiva clear no eyelid edema noted. pupils equal round and reactive to light and accommodation. Ears, Nose, Mouth, and Throat no gross abnormality of ear auricles or external auditory canals. normal hearing noted during conversation. mucus membranes moist. Respiratory normal breathing without difficulty. Cardiovascular 1+ dorsalis pedis/posterior tibialis pulses. no clubbing, cyanosis, significant edema, <3 sec cap refill. Musculoskeletal normal gait and posture. no significant deformity or arthritic changes, no loss or range of motion, no clubbing. Psychiatric this patient is able to make decisions and demonstrates good  insight into disease process. Alert and Oriented x 3. pleasant and cooperative. Notes Upon inspection patient's wounds actually showed signs of having quite a bit of drainage and again my concern at this point is the possibility that he may need antibiotics possibly even Cipro but again I am not sure exactly  what he has as far as cultures are concerned and I did obtain a wound culture today to further evaluate this. I do not want to take the risk of initiating something like Cipro which can interact with some of his psychiatric medications especially without at least knowing what we are doing. I would also probably need to consult with his other providers before making that decision to be perfectly honest. Either way that something that I am not willing to do at this point. Obviously we will be trying to manage this as we can with dressings and good wound care measures in general. Electronic Signature(s) Signed: 09/20/2019 5:34:09 PM By: Lenda Kelp PA-C Entered By: Lenda Kelp on 09/20/2019 17:34:09 Jeffrey Lin ([id number]) -------------------------------------------------------------------------------- Physician Orders Details Patient Name: Jeffrey Lin Date of Service: 09/20/2019 9:15 AM Medical Record Number: [id number] Patient Account Number: [id number] Date of Birth/Sex: 02/27/1975 (44 y.o. 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 Primary Wound Dressing o Silver Alginate o XtraSorb Dressing Change Frequency o Change dressing every week Follow-up Appointments o Return Appointment in 1 week. o Nurse Visit as needed - Nurse visit may be made this week Edema Control o Unna Boots Bilaterally o Elevate legs to the level of the heart and pump ankles as often as possible Laboratory o Bacteria identified in Wound by Culture (MICRO) oooo LOINC Code: 6462-6 oooo Convenience Name: Wound culture routine Services and Therapies o Arterial Studies- Bilateral Patient Medications Allergies: Toradol, Lamotrigine Notifications Medication Indication Start End Augmentin 09/24/2019 DOSE 1 - oral 875 mg-125 mg tablet - 1 tablet oral taken 2 times per day for 14 days Cipro 09/24/2019 DOSE 1 - oral 500 mg tablet - 1 tablet oral taken 2 times per day for 14 days Electronic Signature(s) Signed: 09/24/2019 8:43:22 AM By: Lenda Kelp PA-C Previous Signature: 09/20/2019 5:20:53 PM Version By: Tyler Aas Previous Signature: 09/20/2019 5:36:54 PM Version By: Lenda Kelp PA-C Entered By: Lenda Kelp on 09/24/2019 08:43:21 TORREN, MAFFEO ([id number]) TRAMAR, BRUECKNER ([id number]) -------------------------------------------------------------------------------- Problem List Details Patient Name: Jeffrey Lin Date of Service: 09/20/2019 9:15 AM Medical Record Number: [id number] Patient Account Number: [id number] Date of Birth/Sex: 09-14-1975 (44 y.o. M) Treating RN: Huel Coventry Primary Care Provider: Kerby Nora Other Clinician: Referring Provider: Kerby Nora Treating Provider/Extender: Linwood Dibbles, Simran Mannis Weeks in Treatment: 0 Active Problems ICD-10 Encounter Code Description Active Date MDM Diagnosis I89.0 Lymphedema, not elsewhere classified 09/20/2019 No Yes L97.821 Non-pressure chronic ulcer of other part of left lower leg limited to 09/20/2019 No Yes breakdown of skin L97.811 Non-pressure  chronic ulcer of other part of right lower leg limited to 09/20/2019 No Yes breakdown of skin I10 Essential (primary) hypertension 09/20/2019 No Yes Inactive Problems Resolved Problems Electronic Signature(s) Signed: 09/20/2019 5:29:43 PM By: Lenda Kelp PA-C Previous Signature: 09/20/2019 10:18:41 AM Version By: Lenda Kelp PA-C Entered By: Lenda Kelp on 09/20/2019 17:29:43 Jeffrey Lin ([id number]) -------------------------------------------------------------------------------- Progress Note Details Patient Name: Jeffrey Lin Date of Service: 09/20/2019 9:15 AM Medical Record  Number: [id number] Patient Account Number: [id number] Date of Birth/Sex: 08/19/1975 (44 y.o. M) Treating RN: Huel CoventryWoody, Kim Primary Care Provider: Kerby NoraBedsole, Amy Other Clinician: Referring Provider: Kerby NoraBedsole, Amy Treating Provider/Extender: Linwood DibblesSTONE III, Aleeza [place] Weeks in Treatment: 0 Subjective Chief Complaint Information obtained from Patient Bilateral LE Lymphedema History of Present Illness (HPI) 09/20/2019 upon evaluation today patient presents for initial evaluation in our clinic concerning issues that he has been having with wounds over the bilateral lower extremities. Both legs seem to be fairly equally affected at this point based on what I am seeing. With that being said the patient also appears to be having some drainage which is somewhat blue/green in nature and does have me concerned about the possibility of infection. I explained this often can indicate a Pseudomonas infection despite the fact that the patient has been on antibiotics currently this could still be problematic. He currently does not have home health that is something that we did discussed the potential for looking into. I think that could be beneficial for him to be honest. The patient seems somewhat frustrated with the situation in general. He does have a history of lymphedema, hypertension, and he does ambulate and walk though not  a lot and tells me that he really does not drive unless is absolutely forced for something he absolutely has to do otherwise he is essentially homebound at this point. He did have a believe his mother with him today. Patient History Information obtained from Patient. Allergies Toradol (Severity: Severe, Reaction: swelling), Lamotrigine (Reaction: rash) Family History Cancer - Maternal Grandparents, Diabetes - Mother, Hypertension - Mother, Lung Disease - Father, Seizures - Siblings, Stroke - Paternal Grandparents, No family history of Heart Disease, Hereditary Spherocytosis, Kidney Disease, Thyroid Problems, Tuberculosis. Social History Current every day smoker, Marital Status - Single, Alcohol Use - Rarely, Drug Use - No History, Caffeine Use - Daily. Medical History Cardiovascular Patient has history of Hypertension Denies history of Angina, Arrhythmia, Congestive Heart Failure, Coronary Artery Disease, Deep Vein Thrombosis, Hypotension, Myocardial Infarction, Peripheral Arterial Disease, Peripheral Venous Disease, Phlebitis, Vasculitis Integumentary (Skin) Denies history of History of Burn, History of pressure wounds Review of Systems (ROS) Constitutional Symptoms (General Health) Denies complaints or symptoms of Fatigue, Fever, Chills, Marked Weight Change. Eyes Denies complaints or symptoms of Dry Eyes, Vision Changes, Glasses / Contacts. Ear/Nose/Mouth/Throat Denies complaints or symptoms of Difficult clearing ears, Sinusitis. Hematologic/Lymphatic Denies complaints or symptoms of Bleeding / Clotting Disorders, Human Immunodeficiency Virus. Respiratory Denies complaints or symptoms of Chronic or frequent coughs, Shortness of Breath. Cardiovascular Complains or has symptoms of LE edema. Denies complaints or symptoms of Chest pain. Gastrointestinal Denies complaints or symptoms of Frequent diarrhea, Nausea, Vomiting. Endocrine Denies complaints or symptoms of Hepatitis,  Thyroid disease, Polydypsia (Excessive Thirst). Genitourinary Denies complaints or symptoms of Kidney failure/ Dialysis, Incontinence/dribbling. Immunological Denies complaints or symptoms of Hives, Itching. Integumentary (Skin) Complains or has symptoms of Wounds, Swelling. Denies complaints or symptoms of Bleeding or bruising tendency, Breakdown. Musculoskeletal Denies complaints or symptoms of Muscle Pain, Muscle Weakness. Jeffrey GillisSTEWART, Jihaad G. ([id number]) Neurologic Denies complaints or symptoms of Numbness/parasthesias, Focal/Weakness. Psychiatric Denies complaints or symptoms of Anxiety, Claustrophobia. Objective Constitutional patient is hypertensive.. pulse regular and within target range for patient.Marland Kitchen. respirations regular, non-labored and within target range for patient.Marland Kitchen. temperature within target range for patient.. Well-nourished and well-hydrated in no acute distress. Vitals Time Taken: 9:15 AM, Height: 77 in, Source: Stated, Weight: 265 lbs, Source: Stated, BMI: 31.4, Temperature: 98.1 F, Pulse: 94 bpm, Respiratory Rate: 22 breaths/min,  Blood Pressure: 160/98 mmHg. Eyes conjunctiva clear no eyelid edema noted. pupils equal round and reactive to light and accommodation. Ears, Nose, Mouth, and Throat no gross abnormality of ear auricles or external auditory canals. normal hearing noted during conversation. mucus membranes moist. Respiratory normal breathing without difficulty. Cardiovascular 1+ dorsalis pedis/posterior tibialis pulses. no clubbing, cyanosis, significant edema, Musculoskeletal normal gait and posture. no significant deformity or arthritic changes, no loss or range of motion, no clubbing. Psychiatric this patient is able to make decisions and demonstrates good insight into disease process. Alert and Oriented x 3. pleasant and cooperative. General Notes: Upon inspection patient's wounds actually showed signs of having quite a bit of drainage and again my  concern at this point is the possibility that he may need antibiotics possibly even Cipro but again I am not sure exactly what he has as far as cultures are concerned and I did obtain a wound culture today to further evaluate this. I do not want to take the risk of initiating something like Cipro which can interact with some of his psychiatric medications especially without at least knowing what we are doing. I would also probably need to consult with his other providers before making that decision to be perfectly honest. Either way that something that I am not willing to do at this point. Obviously we will be trying to manage this as we can with dressings and good wound care measures in general. Other Condition(s) Patient presents with Lymphedema located on the Left Leg. Patient presents with Lymphedema located on the Right Leg. Patient presents with Lymphedema located on the Right Ankle. 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 BRODYN, DEPUY ([id number]) Post procedure Diagnosis Wound #: Same as Pre-Procedure Plan Wound Cleansing: Clean wound with Normal Saline. Primary Wound Dressing: Silver Alginate XtraSorb Dressing Change Frequency: Change dressing every week Follow-up Appointments: Return Appointment in 1 week. Nurse Visit as needed - Nurse visit may be made this week Edema Control: Unna Boots Bilaterally Elevate legs to the level of the heart and pump ankles as often as possible Laboratory ordered were: Wound culture routine Services and Therapies ordered were: Arterial Studies- Bilateral The following  medication(s) was prescribed: Augmentin oral 875 mg-125 mg tablet 1 1 tablet oral taken 2 times per day for 14 days starting 09/24/2019 Cipro oral 500 mg tablet 1 1 tablet oral taken 2 times per day for 14 days starting 09/24/2019 1 I Minna suggest currently that we go ahead and initiate treatment with silver alginate followed by Anola Gurney to the bilateral lower extremities at all open wound locations. 2. I am also can recommend Unna boots bilaterally for the patient. 3. I would also recommend that we go ahead and send the patient for arterial studies bilaterally in order to evaluate the arterial flow and ensure that this is sufficient. For stronger compression. 4. I am also can recommend the patient needs to elevate his legs much as possible try to keep edema under good control. 5. I would also suggest we going obtain a wound culture which I did today getting on the results of the culture we may have to initiate further treatment with antibiotics but again a lot depends  on what we see on the culture. We will see patient back for reevaluation in 1 week here in the clinic. If anything worsens or changes patient will contact our office for additional recommendations. 09/24/2019 I did review patient's culture results today and it does appear that he has both Pseudomonas as well as Enterococcus noted on culture. Subsequently based on the drainage and what he still continuing to experience even with the compression wrap I do believe initiating antibiotic therapy would be ideal. I Ernie Hew go ahead and place him on Cipro as well as Augmentin to cover for both organisms he is no longer taking the amantadine or the risk for do not which is good news and those were the biggest interactions with the Cipro. Therefore he can take the Cipro as I feel like that that is ending is just a minimal reaction as discussed with the patient today and the risk of not taking this medication outweigh the benefits in regard to  that. Electronic Signature(s) Signed: 09/24/2019 8:44:25 AM By: Lenda Kelp PA-C Previous Signature: 09/20/2019 5:35:26 PM Version By: Lenda Kelp PA-C Entered By: Lenda Kelp on 09/24/2019 08:44:24 MAYJOR, AGER ([id number]) -------------------------------------------------------------------------------- ROS/PFSH Details Patient Name: Jeffrey Lin Date of Service: 09/20/2019 9:15 AM Medical Record Number: [id number] Patient Account Number: [id number] Date of Birth/Sex: 1975-09-05 (44 y.o. M) Treating RN: Huel Coventry Primary Care Provider: Kerby Nora Other Clinician: Referring Provider: Kerby Nora Treating Provider/Extender: Linwood Dibbles, Lino Wickliff Weeks in Treatment: 0 Information Obtained From Patient Constitutional Symptoms (General Health) Complaints and Symptoms: Negative for: Fatigue; Fever; Chills; Marked Weight Change Eyes Complaints and Symptoms: Negative for: Dry Eyes; Vision Changes; Glasses / Contacts Ear/Nose/Mouth/Throat Complaints and Symptoms: Negative for: Difficult clearing ears; Sinusitis Hematologic/Lymphatic Complaints and Symptoms: Negative for: Bleeding / Clotting Disorders; Human Immunodeficiency Virus Respiratory Complaints and Symptoms: Negative for: Chronic or frequent coughs; Shortness of Breath Cardiovascular Complaints and Symptoms: Positive for: LE edema Negative for: Chest pain Medical History: Positive for: Hypertension Negative for: Angina; Arrhythmia; Congestive Heart Failure; Coronary Artery Disease; Deep Vein Thrombosis; Hypotension; Myocardial Infarction; Peripheral Arterial Disease; Peripheral Venous Disease; Phlebitis; Vasculitis Gastrointestinal Complaints and Symptoms: Negative for: Frequent diarrhea; Nausea; Vomiting Endocrine Complaints and Symptoms: Negative for: Hepatitis; Thyroid disease; Polydypsia (Excessive Thirst) Genitourinary Complaints and Symptoms: Negative for: Kidney failure/ Dialysis;  Incontinence/dribbling Immunological Complaints and Symptoms: Negative for: Hives; Itching JAVANI, SPRATT ([id number]) Integumentary (Skin) Complaints and Symptoms: Positive for: Wounds; Swelling Negative for: Bleeding or bruising tendency; Breakdown Medical History: Negative for: History of Burn; History of pressure wounds Musculoskeletal Complaints and Symptoms: Negative for: Muscle Pain; Muscle Weakness Neurologic Complaints and Symptoms: Negative for: Numbness/parasthesias; Focal/Weakness Psychiatric Complaints and Symptoms: Negative for: Anxiety; Claustrophobia Oncologic Immunizations Pneumococcal Vaccine: Received Pneumococcal Vaccination: No Implantable Devices None Family and Social History Cancer: Yes - Maternal Grandparents; Diabetes: Yes - Mother; Heart Disease: No; Hereditary Spherocytosis: No; Hypertension: Yes - Mother; Kidney Disease: No; Lung Disease: Yes - Father; Seizures: Yes - Siblings; Stroke: Yes - Paternal Grandparents; Thyroid Problems: No; Tuberculosis: No; Current every day smoker; Marital Status - Single; Alcohol Use: Rarely; Drug Use: No History; Caffeine Use: Daily; Financial Concerns: No; Food, Clothing or Shelter Needs: No; Support System Lacking: No; Transportation Concerns: No Electronic Signature(s) Signed: 09/20/2019 2:14:51 PM By: Benna Dunks Signed: 09/20/2019 5:36:54 PM By: Lenda Kelp PA-C Signed: 09/22/2019 11:13:21 AM By: Elliot Gurney, BSN, RN, CWS, Kim RN, BSN Entered By: Benna Dunks on 09/20/2019 10:00:47 Jeffrey Lin ([id number]) -------------------------------------------------------------------------------- SuperBill Details Patient Name: CAWOOD,  Gerado Reece Agar Date of Service: 09/20/2019 Medical Record Number: [id number] Patient Account Number: [id number] Date of Birth/Sex: 1975/02/03 (45 y.o. 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 I10 Essential (primary) hypertension Facility Procedures CPT4 Code: [id number] Description: [phone] - WOUND CARE VISIT-LEV 5 EST PT Modifier: Quantity: 1 Physician Procedures CPT4 Code: [id number] Description: 99213 - WC PHYS LEVEL 3 - EST PT Modifier: Quantity: 1 CPT4 Code: Description: ICD-10 Diagnosis Description I89.0 Lymphedema, not elsewhere classified L97.821 Non-pressure chronic ulcer of other part of left lower leg limited to bre L97.511 Non-pressure chronic ulcer of other part of right foot limited to breakdo I10  Essential (primary) hypertension Modifier: akdown of skin wn of skin Quantity: Electronic Signature(s) Signed: 09/20/2019 5:36:14 PM By: Lenda Kelp PA-C Previous Signature: 09/20/2019 5:20:53 PM Version By: Tyler Aas Entered By: Lenda Kelp on 09/20/2019 17:36:13

## 2019-09-22 NOTE — Progress Notes (Signed)
KHARTER, BREW ([id number]) Visit Report for 09/20/2019 Allergy List Details Patient Name: Jeffrey Lin, Jeffrey Lin Date of Service: 09/20/2019 9:15 AM Medical Record Number: [id number] Patient Account Number: [id number] Date of Birth/Sex: 02-May-1975 (44 y.o. M) Treating RN: Huel Coventry Primary Care Daniell Paradise: Kerby Nora Other Clinician: Referring Telicia Hodgkiss: Kerby Nora Treating Jahid Weida/Extender: Linwood Dibbles, HOYT Weeks in Treatment: 0 Allergies Active Allergies Toradol Reaction: swelling Severity: Severe Lamotrigine Reaction: rash Allergy Notes Electronic Signature(s) Signed: 09/20/2019 2:14:51 PM By: Benna Dunks Entered By: Benna Dunks on 09/20/2019 10:11:53 Jeffrey Lin ([id number]) -------------------------------------------------------------------------------- Arrival Information Details Patient Name: Jeffrey Lin Date of Service: 09/20/2019 9:15 AM Medical Record Number: [id number] Patient Account Number: [id number] Date of Birth/Sex: 20-Apr-1975 (44 y.o. M) Treating RN: Huel Coventry Primary Care Jailyn Langhorst: Kerby Nora Other Clinician: Referring Dalton Molesworth: Kerby Nora Treating Erica Osuna/Extender: Linwood Dibbles, HOYT Weeks in Treatment: 0 Visit Information Patient Arrived: Ambulatory Arrival Time: 09:47 Accompanied By: mother Transfer Assistance: None Patient Identification Verified: Yes Secondary Verification Process Completed: Yes Patient Requires Transmission-Based Precautions: No Patient Has Alerts: No Electronic Signature(s) Signed: 09/20/2019 2:14:51 PM By: Benna Dunks Entered By: Benna Dunks on 09/20/2019 09:48:04 Jeffrey Lin ([id number]) -------------------------------------------------------------------------------- Clinic Level of Care Assessment Details Patient Name: Jeffrey Lin Date of Service: 09/20/2019 9:15 AM Medical Record Number: [id number] Patient Account Number: [id number] Date of Birth/Sex: 06-Mar-1975 (44 y.o. M) Treating RN:  Tyler Aas Primary Care Carisha Kantor: Kerby Nora Other Clinician: Referring Hasaan Radde: Kerby Nora Treating Selen Smucker/Extender: Linwood Dibbles, HOYT Weeks in Treatment: 0 Clinic Level of Care Assessment Items TOOL 2 Quantity Score []  - Use when only an EandM is performed on the INITIAL visit 0 ASSESSMENTS - Nursing Assessment / Reassessment X - General Physical Exam (combine w/ comprehensive assessment (listed just below) when performed on new 1 20 pt. evals) X- 1 25 Comprehensive Assessment (HX, ROS, Risk Assessments, Wounds Hx, etc.) ASSESSMENTS - Wound and Skin Assessment / Reassessment []  - Simple Wound Assessment / Reassessment - one wound 0 X- 2 5 Complex Wound Assessment / Reassessment - multiple wounds []  - 0 Dermatologic / Skin Assessment (not related to wound area) ASSESSMENTS - Ostomy and/or Continence Assessment and Care []  - Incontinence Assessment and Management 0 []  - 0 Ostomy Care Assessment and Management (repouching, etc.) PROCESS - Coordination of Care []  - Simple Patient / Family Education for ongoing care 0 X- 1 20 Complex (extensive) Patient / Family Education for ongoing care X- 1 10 Staff obtains , Records, Test Results / Process Orders []  - 0 Staff telephones HHA, Nursing Homes / Clarify orders / etc []  - 0 Routine Transfer to another Facility (non-emergent condition) []  - 0 Routine Hospital Admission (non-emergent condition) X- 1 15 New Admissions / / Ordering NPWT, Apligraf, etc. []  - 0 Emergency Hospital Admission (emergent condition) []  - 0 Simple Discharge Coordination X- 1 15 Complex (extensive) Discharge Coordination PROCESS - Special Needs []  - Pediatric / Minor Patient Management 0 []  - 0 Isolation Patient Management []  - 0 Hearing / Language / Visual special needs []  - 0 Assessment of Community assistance (transportation, D/C planning, etc.) []  - 0 Additional assistance / Altered mentation []  -  0 Support Surface(s) Assessment (bed, cushion, seat, etc.) INTERVENTIONS - Wound Cleansing / Measurement X - Wound Imaging (photographs - any number of wounds) 1 5 []  - 0 Wound Tracing (instead of photographs) []  - 0 Simple Wound Measurement - one wound X- 2 5 Complex Wound Measurement - multiple wounds Mercado, Konnar G. ( ) []  -  0 Simple Wound Cleansing - one wound X- 2 5 Complex Wound Cleansing - multiple wounds INTERVENTIONS - Wound Dressings []  - Small Wound Dressing one or multiple wounds 0 []  - 0 Medium Wound Dressing one or multiple wounds X- 2 20 Large Wound Dressing one or multiple wounds []  - 0 Application of Medications - injection INTERVENTIONS - Miscellaneous []  - External ear exam 0 []  - 0 Specimen Collection (cultures, biopsies, blood, body fluids, etc.) []  - 0 Specimen(s) / Culture(s) sent or taken to Lab for analysis []  - 0 Patient Transfer (multiple staff / Nurse, adultHoyer Lift / Similar devices) []  - 0 Simple Staple / Suture removal (25 or less) []  - 0 Complex Staple / Suture removal (26 or more) []  - 0 Hypo / Hyperglycemic Management (close monitor of Blood Glucose) X- 1 15 Ankle / Brachial Index (ABI) - do not check if billed separately Has the patient been seen at the hospital within the last three years: Yes Total Score: 195 Level Of Care: New/Established - Level 5 Electronic Signature(s) Signed: 09/20/2019 5:20:53 PM By: Tyler AasButler, Michelle Entered By: Tyler AasButler, Michelle on 09/20/2019 10:39:25 Jeffrey Lin, Jeffrey G. ([id number]) -------------------------------------------------------------------------------- Compression Therapy Details Patient Name: Jeffrey Lin, Jeffrey G. Date of Service: 09/20/2019 9:15 AM Medical Record Number: [id number] Patient Account Number: [id number] Date of Birth/Sex: 08/21/1975 (44 y.o. M) Treating RN: Tyler AasButler, Michelle Primary Care Sibyl Mikula: Kerby NoraBedsole, Amy Other Clinician: Referring Zoraida Havrilla: Kerby NoraBedsole, Amy Treating Kasir Hallenbeck/Extender:  Linwood DibblesSTONE III, HOYT Weeks in Treatment: 0 Compression Therapy Performed for Wound Assessment: NonWound Condition Lymphedema - Right Ankle Performed By: Clinician Tyler AasButler, Michelle, RN Compression Type: Henriette CombsUnna Boot Pre Treatment ABI: 0.9 Post Procedure Diagnosis Same as Pre-procedure Electronic Signature(s) Signed: 09/20/2019 5:20:53 PM By: Tyler AasButler, Michelle Entered By: Tyler AasButler, Michelle on 09/20/2019 11:14:38 Jeffrey Lin, Jeffrey G. ([id number]) -------------------------------------------------------------------------------- Encounter Discharge Information Details Patient Name: Jeffrey Lin, Jeffrey G. Date of Service: 09/20/2019 9:15 AM Medical Record Number: [id number] Patient Account Number: [id number] Date of Birth/Sex: 10/09/1975 ([age] y.o. M) Treating RN: Tyler AasButler, Michelle Primary Care Love Chowning: Kerby NoraBedsole, Amy Other Clinician: Referring Ronasia Isola: Kerby NoraBedsole, Amy Treating Hymie Gorr/Extender: Linwood DibblesSTONE III, HOYT Weeks in Treatment: 0 Encounter Discharge Information Items Discharge Condition: Stable Ambulatory Status: Ambulatory Discharge Destination: Home Transportation: Private Auto Accompanied By: mother Schedule Follow-up Appointment: Yes Clinical Summary of Care: Electronic Signature(s) Signed: 09/20/2019 5:20:53 PM By: Tyler AasButler, Michelle Entered By: Tyler AasButler, Michelle on 09/20/2019 10:41:55 Jeffrey Lin, Roberts G. ([id number]) -------------------------------------------------------------------------------- Lower Extremity Assessment Details Patient Name: Jeffrey Lin, Jeffrey G. Date of Service: 09/20/2019 9:15 AM Medical Record Number: [id number] Patient Account Number: [id number] Date of Birth/Sex: 01/08/1976 (44 y.o. M) Treating RN: Huel CoventryWoody, Kim Primary Care Tiernan Millikin: Kerby NoraBedsole, Amy Other Clinician: Referring Shilah Hefel: Kerby NoraBedsole, Amy Treating Laurali Goddard/Extender: Linwood DibblesSTONE III, HOYT Weeks in Treatment: 0 Edema Assessment Assessed: [Left: Yes] [Right: Yes] Edema: [Left: Yes] [Right: Yes] Calf Left: Right: Point of Measurement: 30  cm From Medial Instep 45.5 cm 44 cm Ankle Left: Right: Point of Measurement: 14 cm From Medial Instep 35.5 cm 34.5 cm Vascular Assessment Pulses: Dorsalis Pedis Doppler Audible: [Left:Yes] [Right:Yes] Posterior Tibial Doppler Audible: [Right:Yes] Blood Pressure: Brachial: [Left:150] [Right:160] Dorsalis Pedis: 122 [Left:Dorsalis Pedis: 110] Ankle: Posterior Tibial: 140 [Left:Posterior Tibial: 130 0.88] [Right:0.81] Electronic Signature(s) Signed: 09/20/2019 2:14:51 PM By: Benna DunksLineberry, Carol Signed: 09/22/2019 11:13:21 AM By: Elliot GurneyWoody, BSN, RN, CWS, Kim RN, BSN Entered By: Benna DunksLineberry, Carol on 09/20/2019 09:54:02 Jeffrey Lin, Howie G. ([id number]) -------------------------------------------------------------------------------- Multi Wound Chart Details Patient Name: Jeffrey Lin, Jeffrey G. Date of Service: 09/20/2019 9:15 AM Medical Record Number: [id number] Patient Account Number: [id number] Date of Birth/Sex: 07/30/1975 (44 y.o.  M) Treating RN: Tyler Aas Primary Care Shaunie Boehm: Kerby Nora Other Clinician: Referring Mustafa Potts: Kerby Nora Treating Nainika Newlun/Extender: Linwood Dibbles, HOYT Weeks in Treatment: 0 Vital Signs Height(in): 77 Pulse(bpm): 94 Weight(lbs): 265 Blood Pressure(mmHg): 160/98 Body Mass Index(BMI): 31 Temperature(F): 98.1 Respiratory Rate(breaths/min): 22 Wound Assessments Treatment Notes Electronic Signature(s) Signed: 09/20/2019 5:20:53 PM By: Tyler Aas Entered By: Tyler Aas on 09/20/2019 10:25:26 Jeffrey Lin ([id number]) -------------------------------------------------------------------------------- Multi-Disciplinary Care Plan Details Patient Name: Jeffrey Lin Date of Service: 09/20/2019 9:15 AM Medical Record Number: [id number] Patient Account Number: [id number] Date of Birth/Sex: [birth year] (44 y.o. M) Treating RN: Huel Coventry Primary Care Yechiel Erny: Kerby Nora Other Clinician: Referring Ziasia Lenoir: Kerby Nora Treating  Alexandre Faries/Extender: Linwood Dibbles, HOYT Weeks in Treatment: 0 Active Inactive Orientation to the Wound Care Program Nursing Diagnoses: Knowledge deficit related to the wound healing center program Goals: Patient/caregiver will verbalize understanding of the Wound Healing Center Program Date Initiated: 09/20/2019 Target Resolution Date: 09/24/2019 Goal Status: Active Interventions: Provide education on orientation to the wound center Notes: Wound/Skin Impairment Nursing Diagnoses: Impaired tissue integrity Knowledge deficit related to ulceration/compromised skin integrity Goals: Patient/caregiver will verbalize understanding of skin care regimen Date Initiated: 09/20/2019 Target Resolution Date: 10/01/2019 Goal Status: Active Interventions: Assess patient/caregiver ability to obtain necessary supplies Assess patient/caregiver ability to perform ulcer/skin care regimen upon admission and as needed Assess ulceration(s) every visit Provide education on ulcer and skin care Treatment Activities: Skin care regimen initiated : 09/20/2019 Topical wound management initiated : 09/20/2019 Notes: Electronic Signature(s) Signed: 09/20/2019 5:20:53 PM By: Tyler Aas Signed: 09/22/2019 11:13:21 AM By: Elliot Gurney, BSN, RN, CWS, Kim RN, BSN Entered By: Tyler Aas on 09/20/2019 10:24:35 Jeffrey Lin ([id number]) -------------------------------------------------------------------------------- Non-Wound Condition Assessment Details Patient Name: Jeffrey Lin Date of Service: 09/20/2019 9:15 AM Medical Record Number: [id number] Patient Account Number: [id number] Date of Birth/Gender: February 23, 1975 (44 y.o. M) Treating RN: Huel Coventry Primary Care Physician: Kerby Nora Other Clinician: Referring Physician: Kerby Nora Treating Physician/Extender: Linwood Dibbles, HOYT Weeks in Treatment: 0 Non-Wound Condition: Condition: Lymphedema Location: Leg Side: Left Photos Electronic Signature(s) Signed:  09/20/2019 2:14:51 PM By: Benna Dunks Signed: 09/22/2019 11:13:21 AM By: Elliot Gurney, BSN, RN, CWS, Kim RN, BSN Entered By: Benna Dunks on 09/20/2019 10:10:11 Jeffrey Lin ([id number]) -------------------------------------------------------------------------------- Non-Wound Condition Assessment Details Patient Name: Jeffrey Lin Date of Service: 09/20/2019 9:15 AM Medical Record Number: [id number] Patient Account Number: [id number] Date of Birth/Gender: [birth year] (44 y.o. M) Treating RN: Huel Coventry Primary Care Physician: Kerby Nora Other Clinician: Referring Physician: Kerby Nora Treating Physician/Extender: Linwood Dibbles, HOYT Weeks in Treatment: 0 Non-Wound Condition: Condition: Lymphedema Location: Leg Side: Right Photos Electronic Signature(s) Signed: 09/20/2019 2:14:51 PM By: Benna Dunks Signed: 09/22/2019 11:13:21 AM By: Elliot Gurney, BSN, RN, CWS, Kim RN, BSN Entered By: Benna Dunks on 09/20/2019 10:10:11 Jeffrey Lin ([id number]) -------------------------------------------------------------------------------- Non-Wound Condition Assessment Details Patient Name: Jeffrey Lin Date of Service: 09/20/2019 9:15 AM Medical Record Number: [id number] Patient Account Number: [id number] Date of Birth/Gender: 03-18-1975 (44 y.o. M) Treating RN: Huel Coventry Primary Care Physician: Kerby Nora Other Clinician: Referring Physician: Kerby Nora Treating Physician/Extender: Linwood Dibbles, HOYT Weeks in Treatment: 0 Non-Wound Condition: Condition: Lymphedema Location: Ankle Side: Right Photos Electronic Signature(s) Signed: 09/20/2019 2:14:51 PM By: Benna Dunks Signed: 09/22/2019 11:13:21 AM By: Elliot Gurney, BSN, RN, CWS, Kim RN, BSN Entered By: Benna Dunks on 09/20/2019 10:10:11 Jeffrey Lin, Jeffrey Lin ([id number]) -------------------------------------------------------------------------------- Pain Assessment Details Patient Name: Jeffrey Lin Date of Service:  09/20/2019 9:15 AM Medical Record Number: [id number] Patient Account Number: [id number]  Date of Birth/Sex: 12/09/1975 (44 y.o. M) Treating RN: Huel Coventry Primary Care Maximillian Habibi: Kerby Nora Other Clinician: Referring Jamorion Gomillion: Kerby Nora Treating Richard Ritchey/Extender: Linwood Dibbles, HOYT Weeks in Treatment: 0 Active Problems Location of Pain Severity and Description of Pain Patient Has Paino Yes Site Locations Pain Location: Pain in Ulcers With Dressing Change: Yes Duration of the Pain. Constant / Intermittento Intermittent Rate the pain. Current Pain Level: 4 Worst Pain Level: 7 Least Pain Level: 4 Tolerable Pain Level: 5 Character of Pain Describe the Pain: Aching, Burning, Tender Pain Management and Medication Current Pain Management: Goals for Pain Management states he takes gabapentin for pain. Electronic Signature(s) Signed: 09/20/2019 2:14:51 PM By: Benna Dunks Signed: 09/22/2019 11:13:21 AM By: Elliot Gurney, BSN, RN, CWS, Kim RN, BSN Entered By: Benna Dunks on 09/20/2019 09:49:41 Jeffrey Lin, Jeffrey Lin ([id number]) -------------------------------------------------------------------------------- Patient/Caregiver Education Details Patient Name: Jeffrey Lin Date of Service: 09/20/2019 9:15 AM Medical Record Number: [id number] Patient Account Number: [id number] Date of Birth/Gender: [birth year] (44 y.o. M) Treating RN: Tyler Aas Primary Care Physician: Kerby Nora Other Clinician: Referring Physician: Kerby Nora Treating Physician/Extender: Skeet Simmer in Treatment: 0 Education Assessment Education Provided To: Patient and Caregiver Education Topics Provided Welcome To The [facility]: Handouts: Welcome To The [facility] Methods: Explain/Verbal Responses: State content correctly Wound/Skin Impairment: Handouts: Skin Care Do's and Dont's Methods: Explain/Verbal Responses: State content correctly Electronic Signature(s) Signed: 09/20/2019  5:20:53 PM By: Tyler Aas Entered By: Tyler Aas on 09/20/2019 10:40:30 Jeffrey Lin ([id number]) -------------------------------------------------------------------------------- Vitals Details Patient Name: Jeffrey Lin Date of Service: 09/20/2019 9:15 AM Medical Record Number: [id number] Patient Account Number: [id number] Date of Birth/Sex: December 06, 1975 (44 y.o. M) Treating RN: Huel Coventry Primary Care Lennard Capek: Kerby Nora Other Clinician: Referring Charly Holcomb: Kerby Nora Treating Willies Laviolette/Extender: Linwood Dibbles, HOYT Weeks in Treatment: 0 Vital Signs Time Taken: 09:15 Temperature (F): 98.1 Height (in): 77 Pulse (bpm): 94 Source: Stated Respiratory Rate (breaths/min): 22 Weight (lbs): 265 Blood Pressure (mmHg): 160/98 Source: Stated Reference Range: 80 - 120 mg / dl Body Mass Index (BMI): 31.4 Electronic Signature(s) Signed: 09/20/2019 2:14:51 PM By: Benna Dunks Entered By: Benna Dunks on 09/20/2019 09:50:28

## 2019-09-22 NOTE — Progress Notes (Signed)
ROSS, BENDER ([id number]) Visit Report for 09/20/2019 Abuse/Suicide Risk Screen Details Patient Name: Jeffrey Lin, Jeffrey Lin Date of Service: 09/20/2019 9:15 AM Medical Record Number: [id number] Patient Account Number: [id number] Date of Birth/Sex: 1975/03/04 (44 y.o. M) Treating RN: Huel Coventry Primary Care Wilder Amodei: Kerby Nora Other Clinician: Referring Annelie Boak: Kerby Nora Treating Zakee Deerman/Extender: Linwood Dibbles, HOYT Weeks in Treatment: 0 Abuse/Suicide Risk Screen Items Answer ABUSE RISK SCREEN: Has anyone close to you tried to hurt or harm you recentlyo No Do you feel uncomfortable with anyone in your familyo No Has anyone forced you do things that you didnot want to doo No Electronic Signature(s) Signed: 09/20/2019 2:14:51 PM By: Benna Dunks Signed: 09/22/2019 11:13:21 AM By: Elliot Gurney, BSN, RN, CWS, Kim RN, BSN Entered By: Benna Dunks on 09/20/2019 10:01:10 Jeffrey Lin ([id number]) -------------------------------------------------------------------------------- Activities of Daily Living Details Patient Name: Jeffrey Lin Date of Service: 09/20/2019 9:15 AM Medical Record Number: [id number] Patient Account Number: [id number] Date of Birth/Sex: [birth year] (44 y.o. M) Treating RN: Huel Coventry Primary Care Tayvon Culley: Kerby Nora Other Clinician: Referring Merla Sawka: Kerby Nora Treating Elandra Powell/Extender: Linwood Dibbles, HOYT Weeks in Treatment: 0 Activities of Daily Living Items Answer Activities of Daily Living (Please select one for each item) Drive Automobile Completely Able Take Medications Completely Able Use Telephone Completely Able Care for Appearance Completely Able Use Toilet Completely Able Bath / Shower Completely Able Dress Self Completely Able Feed Self Completely Able Walk Completely Able Get In / Out Bed Completely Able Housework Completely Able Prepare Meals Completely Able Handle Money Completely Able Shop for Self Completely Able Electronic  Signature(s) Signed: 09/20/2019 2:14:51 PM By: Benna Dunks Signed: 09/22/2019 11:13:21 AM By: Elliot Gurney, BSN, RN, CWS, Kim RN, BSN Entered By: Benna Dunks on 09/20/2019 10:02:06 Jeffrey Lin ([id number]) -------------------------------------------------------------------------------- Education Screening Details Patient Name: Jeffrey Lin Date of Service: 09/20/2019 9:15 AM Medical Record Number: [id number] Patient Account Number: [id number] Date of Birth/Sex: 1975/09/01 (44 y.o. M) Treating RN: Huel Coventry Primary Care Gerri Acre: Kerby Nora Other Clinician: Referring Deija Buhrman: Kerby Nora Treating Willella Harding/Extender: Linwood Dibbles, HOYT Weeks in Treatment: 0 Learning Preferences/Education Level/Primary Language Highest Education Level: College or Above Preferred Language: English Cognitive Barrier Language Barrier: No Translator Needed: No Memory Deficit: No Emotional Barrier: No Cultural/Religious Beliefs Affecting Medical Care: No Physical Barrier Impaired Vision: No Impaired Hearing: No Decreased Hand dexterity: No Knowledge/Comprehension Knowledge Level: High Comprehension Level: High Ability to understand written instructions: High Ability to understand verbal instructions: High Motivation Anxiety Level: Calm Cooperation: Cooperative Education Importance: Acknowledges Need Interest in Health Problems: Asks Questions Perception: Coherent Willingness to Engage in Self-Management High Activities: Readiness to Engage in Self-Management High Activities: Electronic Signature(s) Signed: 09/20/2019 2:14:51 PM By: Benna Dunks Signed: 09/22/2019 11:13:21 AM By: Elliot Gurney, BSN, RN, CWS, Kim RN, BSN Entered By: Benna Dunks on 09/20/2019 10:02:48 Jeffrey Lin, Jeffrey Lin ([id number]) -------------------------------------------------------------------------------- Fall Risk Assessment Details Patient Name: Jeffrey Lin Date of Service: 09/20/2019 9:15 AM Medical  Record Number: [id number] Patient Account Number: [id number] Date of Birth/Sex: 01-20-1976 (44 y.o. M) Treating RN: Huel Coventry Primary Care Anzlee Hinesley: Kerby Nora Other Clinician: Referring Stefanee Mckell: Kerby Nora Treating Creston Klas/Extender: Linwood Dibbles, HOYT Weeks in Treatment: 0 Fall Risk Assessment Items Have you had 2 or more falls in the last 12 monthso 0 No Have you had any fall that resulted in injury in the last 12 monthso 0 No FALLS RISK SCREEN History of falling - immediate or within 3 months 0 No Secondary diagnosis (Do you have 2 or more medical diagnoseso) 15  Yes Ambulatory aid None/bed rest/wheelchair/nurse 0 No Crutches/cane/walker 0 No Furniture 0 No Intravenous therapy Access/Saline/Heparin Lock 0 No Gait/Transferring Normal/ bed rest/ wheelchair 0 No Weak (short steps with or without shuffle, stooped but able to lift head while walking, may 0 No seek support from furniture) Impaired (short steps with shuffle, may have difficulty arising from chair, head down, impaired 0 No balance) Mental Status Oriented to own ability 0 Yes Electronic Signature(s) Signed: 09/20/2019 2:14:51 PM By: Benna Dunks Signed: 09/22/2019 11:13:21 AM By: Elliot Gurney, BSN, RN, CWS, Kim RN, BSN Entered By: Benna Dunks on 09/20/2019 10:03:21 Jeffrey Lin ([id number]) -------------------------------------------------------------------------------- Foot Assessment Details Patient Name: Jeffrey Lin Date of Service: 09/20/2019 9:15 AM Medical Record Number: [id number] Patient Account Number: [id number] Date of Birth/Sex: [birth year] (44 y.o. M) Treating RN: Huel Coventry Primary Care Ivoree Felmlee: Kerby Nora Other Clinician: Referring Jakya Dovidio: Kerby Nora Treating Deziya Amero/Extender: Linwood Dibbles, HOYT Weeks in Treatment: 0 Foot Assessment Items Site Locations + = Sensation present, - = Sensation absent, C = Callus, U = Ulcer R = Redness, W = Warmth, M = Maceration, PU = Pre-ulcerative  lesion F = Fissure, S = Swelling, D = Dryness Assessment Right: Left: Other Deformity: No No Prior Foot Ulcer: No No Prior Amputation: No No Charcot Joint: No No Ambulatory Status: Gait: Electronic Signature(s) Signed: 09/20/2019 2:14:51 PM By: Benna Dunks Signed: 09/22/2019 11:13:21 AM By: Elliot Gurney, BSN, RN, CWS, Kim RN, BSN Entered By: Benna Dunks on 09/20/2019 10:04:04 Jeffrey Lin ([id number]) -------------------------------------------------------------------------------- Nutrition Risk Screening Details Patient Name: Jeffrey Lin Date of Service: 09/20/2019 9:15 AM Medical Record Number: [id number] Patient Account Number: [id number] Date of Birth/Sex: [birth year] (44 y.o. M) Treating RN: Huel Coventry Primary Care Heavenleigh Petruzzi: Kerby Nora Other Clinician: Referring Camielle Sizer: Kerby Nora Treating Nabila Albarracin/Extender: Linwood Dibbles, HOYT Weeks in Treatment: 0 Height (in): 77 Weight (lbs): 265 Body Mass Index (BMI): 31.4 Nutrition Risk Screening Items Score Screening NUTRITION RISK SCREEN: I have an illness or condition that made me change the kind and/or amount of food I eat 0 No I eat fewer than two meals per day 0 No I eat few fruits and vegetables, or milk products 0 No I have three or more drinks of beer, liquor or wine almost every day 0 No I have tooth or mouth problems that make it hard for me to eat 0 No I don't always have enough money to buy the food I need 0 No I eat alone most of the time 0 No I take three or more different prescribed or over-the-counter drugs a day 0 No Without wanting to, I have lost or gained 10 pounds in the last six months 0 No I am not always physically able to shop, cook and/or feed myself 0 No Nutrition Protocols Good Risk Protocol 0 No interventions needed Moderate Risk Protocol High Risk Proctocol Risk Level: Good Risk Score: 0 Electronic Signature(s) Signed: 09/20/2019 2:14:51 PM By: Benna Dunks Signed: 09/22/2019 11:13:21  AM By: Elliot Gurney, BSN, RN, CWS, Kim RN, BSN Entered By: Benna Dunks on 09/20/2019 10:03:33

## 2019-09-23 ENCOUNTER — Other Ambulatory Visit: Payer: Self-pay | Admitting: Family Medicine

## 2019-09-23 LAB — AEROBIC CULTURE W GRAM STAIN (SUPERFICIAL SPECIMEN)

## 2019-09-24 ENCOUNTER — Other Ambulatory Visit: Payer: Self-pay

## 2019-09-24 ENCOUNTER — Encounter: Payer: PPO | Attending: Physician Assistant

## 2019-09-24 DIAGNOSIS — L97811 Non-pressure chronic ulcer of other part of right lower leg limited to breakdown of skin: Secondary | ICD-10-CM | POA: Diagnosis not present

## 2019-09-24 DIAGNOSIS — I890 Lymphedema, not elsewhere classified: Secondary | ICD-10-CM | POA: Insufficient documentation

## 2019-09-24 DIAGNOSIS — L97821 Non-pressure chronic ulcer of other part of left lower leg limited to breakdown of skin: Secondary | ICD-10-CM | POA: Diagnosis not present

## 2019-09-24 DIAGNOSIS — I10 Essential (primary) hypertension: Secondary | ICD-10-CM | POA: Insufficient documentation

## 2019-09-24 NOTE — Progress Notes (Signed)
JERRICO, COVELLO ([id number]) Visit Report for 09/24/2019 Arrival Information Details Patient Name: Jeffrey Lin, Jeffrey Lin Date of Service: 09/24/2019 8:00 AM Medical Record Number: [id number] Patient Account Number: [id number] Date of Birth/Sex: 05/16/1975 (44 y.o. M) Treating RN: Tyler Aas Primary Care Tayley Mudrick: Kerby Nora Other Clinician: Referring Jahnia Hewes: Kerby Nora Treating Tyrea Froberg/Extender: Linwood Dibbles, HOYT Weeks in Treatment: 0 Visit Information History Since Last Visit All ordered tests and consults were completed: No Patient Arrived: Ambulatory Added or deleted any medications: No Arrival Time: 08:21 Any new allergies or adverse reactions: No Accompanied By: mother Had a fall or experienced change in No Transfer Assistance: None activities of daily living that may affect Patient Identification Verified: Yes risk of falls: Secondary Verification Process Completed: Yes Signs or symptoms of abuse/neglect since last visito No Patient Requires Transmission-Based Precautions: No Hospitalized since last visit: No Patient Has Alerts: No Implantable device outside of the clinic excluding No cellular tissue based products placed in the center since last visit: Pain Present Now: No Electronic Signature(s) Signed: 09/24/2019 5:24:12 PM By: Benna Dunks Entered By: Benna Dunks on 09/24/2019 08:21:52 Jeffrey Lin, Jeffrey Lin ([id number]) -------------------------------------------------------------------------------- Compression Therapy Details Patient Name: Jeffrey Lin Date of Service: 09/24/2019 8:00 AM Medical Record Number: [id number] Patient Account Number: [id number] Date of Birth/Sex: 08-13-1975 (44 y.o. M) Treating RN: Tyler Aas Primary Care Kasaundra Fahrney: Kerby Nora Other Clinician: Referring Izsak Meir: Kerby Nora Treating Selen Smucker/Extender: Linwood Dibbles, HOYT Weeks in Treatment: 0 Compression Therapy Performed for Wound Assessment: Wound #1 Left,Circumferential  Lower Leg Performed By: Clinician Tyler Aas, RN Compression Type: Henriette Combs Electronic Signature(s) Signed: 09/24/2019 5:11:36 PM By: Tyler Aas Entered By: Tyler Aas on 09/24/2019 08:41:01 Jeffrey Lin ([id number]) -------------------------------------------------------------------------------- Compression Therapy Details Patient Name: Jeffrey Lin Date of Service: 09/24/2019 8:00 AM Medical Record Number: [id number] Patient Account Number: [id number] Date of Birth/Sex: 05-28-1975 (44 y.o. M) Treating RN: Tyler Aas Primary Care Braya Habermehl: Kerby Nora Other Clinician: Referring Daran Favaro: Kerby Nora Treating Malcomb Gangemi/Extender: Linwood Dibbles, HOYT Weeks in Treatment: 0 Compression Therapy Performed for Wound Assessment: Wound #2 Right,Circumferential Lower Leg Performed By: Clinician Tyler Aas, RN Compression Type: Henriette Combs Electronic Signature(s) Signed: 09/24/2019 5:11:36 PM By: Tyler Aas Entered By: Tyler Aas on 09/24/2019 08:41:01 Jeffrey Lin, Jeffrey Lin ([id number]) -------------------------------------------------------------------------------- Encounter Discharge Information Details Patient Name: Jeffrey Lin Date of Service: 09/24/2019 8:00 AM Medical Record Number: [id number] Patient Account Number: [id number] Date of Birth/Sex: [birth year] (44 y.o. M) Treating RN: Tyler Aas Primary Care Irene Collings: Kerby Nora Other Clinician: Referring Chyanna Flock: Kerby Nora Treating Zyren Sevigny/Extender: Linwood Dibbles, HOYT Weeks in Treatment: 0 Encounter Discharge Information Items Discharge Condition: Stable Ambulatory Status: Ambulatory Discharge Destination: Home Transportation: Private Auto Accompanied By: mother Schedule Follow-up Appointment: Yes Clinical Summary of Care: Electronic Signature(s) Signed: 09/24/2019 5:11:36 PM By: Tyler Aas Entered By: Tyler Aas on 09/24/2019 08:43:24 Jeffrey Lin  ([id number]) -------------------------------------------------------------------------------- Wound Assessment Details Patient Name: Jeffrey Lin Date of Service: 09/24/2019 8:00 AM Medical Record Number: [id number] Patient Account Number: [id number] Date of Birth/Sex: Nov 18, 1975 (44 y.o. M) Treating RN: Tyler Aas Primary Care Sarath Privott: Kerby Nora Other Clinician: Referring Brenya Taulbee: Kerby Nora Treating Hardy Harcum/Extender: Linwood Dibbles, HOYT Weeks in Treatment: 0 Wound Status Wound Number: 1 Primary Etiology: Lymphedema Wound Location: Left, Circumferential Lower Leg Wound Status: Open Wounding Event: Gradually Appeared Comorbid History: Hypertension Date Acquired: 04/22/2019 Weeks Of Treatment: 0 Clustered Wound: No Photos Wound Measurements Length: (cm) 28 Width: (cm) 43 Depth: (cm) 0.1 Area: (cm) 945.619 Volume: (cm) 94.562 % Reduction in Area: % Reduction in  Volume: Epithelialization: None Tunneling: No Undermining: No Wound Description Classification: Partial Thickness Exudate Amount: Large Exudate Type: Purulent Exudate Color: yellow, brown, green Foul Odor After Cleansing: Yes Due to Product Use: No Slough/Fibrino Yes Wound Bed Granulation Amount: None Present (0%) Exposed Structure Necrotic Amount: Large (67-100%) Fascia Exposed: No Necrotic Quality: Adherent Slough Fat Layer (Subcutaneous Tissue) Exposed: Yes Tendon Exposed: No Muscle Exposed: No Joint Exposed: No Bone Exposed: No Treatment Notes Wound #1 (Left, Circumferential Lower Leg) Notes scell, xsorb, unna bilateral per Giovanni Bath, 2 antibiotics will be prescribed for positive wound culture: cipro and augmentin. These scripts will be sent to your pharmacy. These antibiotics may be taken together, take with food. Electronic Signature(s) Jeffrey Lin, Jeffrey Lin ([id number]) Signed: 09/24/2019 5:11:36 PM By: Tyler Aas Signed: 09/24/2019 5:24:12 PM By: Benna Dunks Entered By: Benna Dunks on 09/24/2019 08:26:11 Jeffrey Lin, Jeffrey Lin ([id number]) -------------------------------------------------------------------------------- Wound Assessment Details Patient Name: Jeffrey Lin Date of Service: 09/24/2019 8:00 AM Medical Record Number: [id number] Patient Account Number: [id number] Date of Birth/Sex: 1975-07-12 (44 y.o. M) Treating RN: Tyler Aas Primary Care Indie Boehne: Kerby Nora Other Clinician: Referring Fanny Agan: Kerby Nora Treating Dallana Mavity/Extender: Linwood Dibbles, HOYT Weeks in Treatment: 0 Wound Status Wound Number: 2 Primary Etiology: Lymphedema Wound Location: Right, Circumferential Lower Leg Wound Status: Open Wounding Event: Gradually Appeared Comorbid History: Hypertension Date Acquired: 04/22/2019 Weeks Of Treatment: 0 Clustered Wound: No Photos Wound Measurements Length: (cm) 28 Width: (cm) 43 Depth: (cm) 0.1 Area: (cm) 945.619 Volume: (cm) 94.562 % Reduction in Area: 0% % Reduction in Volume: 0% Wound Description Classification: Full Thickness Without Exposed Support Structures Wound Margin: Indistinct, nonvisible Exudate Amount: Large Exudate Type: Purulent Exudate Color: yellow, brown, green Foul Odor After Cleansing: Yes Due to Product Use: No Slough/Fibrino Yes Wound Bed Granulation Amount: None Present (0%) Exposed Structure Necrotic Amount: Large (67-100%) Fascia Exposed: No Necrotic Quality: Adherent Slough Fat Layer (Subcutaneous Tissue) Exposed: Yes Tendon Exposed: No Muscle Exposed: No Joint Exposed: No Bone Exposed: No Treatment Notes Wound #2 (Right, Circumferential Lower Leg) Notes scell, xsorb, unna bilateral per Keanu Lesniak, 2 antibiotics will be prescribed for positive wound culture: cipro and augmentin. These scripts will be sent to your pharmacy. These antibiotics may be taken together, take with food. ICHAEL, PULLARA ([id number]) Electronic Signature(s) Signed: 09/24/2019 5:11:36 PM By: Tyler Aas Signed: 09/24/2019 5:24:12 PM By: Benna Dunks Entered By: Benna Dunks on 09/24/2019 09:38:18

## 2019-09-27 NOTE — Assessment & Plan Note (Signed)
Needs cardiac evaluation.

## 2019-09-27 NOTE — Assessment & Plan Note (Signed)
UNclear cause of persistent tachycardia. Refer to cardiology.

## 2019-09-27 NOTE — Progress Notes (Signed)
Chief Complaint  Patient presents with  . Leg Swelling    Bilateral leg swelling - not much improvement since last OV. Redness to lower legs. Feet swelling has gotten worse. SOB is about the same per the patient - low O2 sats upon arrival to exam room. Walking from waiting room to exam room O2 sats dropped to 81%, Pt recovered within 1 minute to 94% with rest and pursed lip breaths.     History of Present Illness: HPI   44 year old male  Smoker presents with continued peripheral edema and SOB minimal improvement since last OV 2 months ago.  Bilateral lower legs remain somewhat red, occ oozing clear liquid. Dry skin on legs, itchy. Continued SOB, but better..hypoxia with exertion in office today.. O2 sat dropped to 81% with exertion, recovered to 94% at rest. On torsemide 20 mg 3 tabs daily.  Abdominal distention is better. He is not feeling as tired or distended .  Able to walk to mailbox easier than before. Tshirts fitting better.   Tachycardia  ECHO 05/16/2019 60-65% BNP nml at 25 last OV 5/20/221: CT Chest IMPRESSION: 1. No evidence of pulmonary embolism. 2. Bibasilar atelectasis. Bilateral dopplers:  5/21/20201 CT abd pelvis IMPRESSION: 1. No acute abdominal process. 2. Linear LEFT basilar atelectasis and paraseptal emphysema. 3. Small umbilical hernia. 4. No free fluid or ascites. 5. 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   He has lost a significant amount of weight  (feels that abdominal distension improvedsince last OV BUT SWELLING NOT BETTER    Wt Readings from Last 3 Encounters:  07/27/19 (!) 305 lb (138.3 kg)  06/11/19 (!) 333 lb 4 oz (151.2 kg)  06/10/19 (!) 315 lb (142.9 kg)   Urinary flow is better on flomax.  4-5 urination a day   This visit occurred during the SARS-CoV-2 public health emergency. Safety protocols were in place, including screening questions prior to the visit, additional usage of staff PPE, and extensive cleaning of exam room while  observing appropriate contact time as indicated for disinfecting solutions.   COVID 19 screen:  No recent travel or known exposure to COVID19 The patient denies respiratory symptoms of COVID 19 at this time. The importance of social distancing was discussed today.     ROS  ROS: Per HPI unless specifically indicated in ROS section       Past Medical History:  Diagnosis Date  . Anxiety   . Chronic leg pain   . Depression   . Essential hypertension, benign 10/20/2015  . Schizoaffective disorder (HCC)     reports that he has been smoking cigarettes. He started smoking about 23 years ago. He has a 23.00 pack-year smoking history. He has quit using smokeless tobacco. He reports that he does not drink alcohol and does not use drugs.   Current Outpatient Medications:  .  amantadine (SYMMETREL) 100 MG capsule, Take 100 mg by mouth 2 (two) times daily., Disp: , Rfl:  .  amphetamine-dextroamphetamine (ADDERALL) 20 MG tablet, Take 1 tablet by mouth in the morning, at noon, in the evening, and at bedtime. , Disp: , Rfl:  .  atorvastatin (LIPITOR) 40 MG tablet, TAKE 1 TABLET BY MOUTH EVERY DAY, Disp: 90 tablet, Rfl: 3 .  diazepam (VALIUM) 10 MG tablet, Take 10 mg by mouth 4 (four) times daily., Disp: , Rfl:  .  gabapentin (NEURONTIN) 600 MG tablet, Take 1,200 mg by mouth 3 (three) times daily. , Disp: , Rfl:  .  losartan (COZAAR) 100 MG  tablet, Take 0.5 tablets (50 mg total) by mouth daily., Disp: 45 tablet, Rfl: 1 .  omeprazole (PRILOSEC) 20 MG capsule, Take 20 mg by mouth daily as needed., Disp: , Rfl:  .  polyethylene glycol powder (MIRALAX) 17 GM/SCOOP powder, Take 17 g by mouth 2 (two) times daily as needed for moderate constipation., Disp: 255 g, Rfl: 0 .  potassium chloride (KLOR-CON) 10 MEQ tablet, TAKE 1 TABLET BY MOUTH EVERY DAY, Disp: 90 tablet, Rfl: 1 .  propranolol (INDERAL) 20 MG tablet, Take 20 mg by mouth 4 (four) times daily., Disp: , Rfl:  .  risperiDONE (RISPERDAL) 2  MG tablet, Take 2 mg by mouth at bedtime., Disp: , Rfl:  .  senna-docusate (SENOKOT-S) 8.6-50 MG tablet, Take 1 tablet by mouth 2 (two) times daily as needed for moderate constipation., Disp: 60 tablet, Rfl: 1 .  tamsulosin (FLOMAX) 0.4 MG CAPS capsule, Take 1 capsule (0.4 mg total) by mouth daily., Disp: 90 capsule, Rfl: 3 .  tizanidine (ZANAFLEX) 2 MG capsule, TAKE 1 CAPSULE BY MOUTH 3 TIMES DAILY AS NEEDED FOR MUSCLE SPASMS., Disp: 270 capsule, Rfl: 0 .  torsemide (DEMADEX) 20 MG tablet, Take 3 tablets (60 mg total) by mouth daily., Disp: 30 tablet, Rfl: 0 .  Vitamin D, Ergocalciferol, (DRISDOL) 50000 units CAPS capsule, Take 1 capsule (50,000 Units total) by mouth 2 (two) times a week., Disp: , Rfl:    Observations/Objective: Blood pressure 140/86, pulse (!) 129, temperature (!) 97.2 F (36.2 C), temperature source Temporal, height 6' 4.5" (1.943 m), weight (!) 305 lb (138.3 kg), SpO2 94 %.  Physical Exam   Constitutional:      Appearance: He is well-developed.     Comments: Neck bent over  HENT:     Head: Normocephalic.     Right Ear: Hearing normal.     Left Ear: Hearing normal.     Nose: Nose normal.  Neck:     Thyroid: No thyroid mass or thyromegaly.     Vascular: No carotid bruit.     Trachea: Trachea normal.  Cardiovascular:     Rate and Rhythm: Normal rate and regular rhythm.     Pulses: Normal pulses.     Heart sounds: Heart sounds not distant. No murmur heard.  No friction rub. No gallop.      Comments: No peripheral edema Pulmonary:     Effort: Pulmonary effort is normal. No respiratory distress.     Breath sounds: Normal breath sounds.  Abdominal:     General: Abdomen is protuberant. Bowel sounds are normal. There is distension.     Palpations: There is no shifting dullness or fluid wave.     Tenderness: There is no abdominal tenderness.  Skin:    General: Skin is warm and dry.     Findings: No rash.     Comments: Slight erythema decreasing in bilateral  legs...   peripheral edema in bilateral legs.. still 2 plus pitting, dry skin patches and some patches oozing clear fluid, no odor. Psychiatric:        Speech: Speech normal.        Behavior: Behavior normal.        Thought Content: Thought content normal.     Assessment and Plan Sinus tachycardia UNclear cause of persistent tachycardia. Refer to cardiology.  Dyspnea on exertion Needs cardiac evaluation.  Peripheral edema Severe despite torsemide. Continue diuretic and potassium.Marland Kitchen re-eval with labs. Refer to cardiology.      Kerby Nora, MD

## 2019-09-27 NOTE — Assessment & Plan Note (Signed)
Severe despite torsemide. Continue diuretic and potassium.Marland Kitchen re-eval with labs. Refer to cardiology.

## 2019-09-28 ENCOUNTER — Other Ambulatory Visit: Payer: Self-pay

## 2019-09-28 ENCOUNTER — Encounter: Payer: PPO | Admitting: Physician Assistant

## 2019-09-28 ENCOUNTER — Ambulatory Visit: Payer: PPO | Admitting: Physician Assistant

## 2019-09-28 DIAGNOSIS — L97812 Non-pressure chronic ulcer of other part of right lower leg with fat layer exposed: Secondary | ICD-10-CM | POA: Diagnosis not present

## 2019-09-28 DIAGNOSIS — L97821 Non-pressure chronic ulcer of other part of left lower leg limited to breakdown of skin: Secondary | ICD-10-CM | POA: Diagnosis not present

## 2019-09-28 DIAGNOSIS — L97822 Non-pressure chronic ulcer of other part of left lower leg with fat layer exposed: Secondary | ICD-10-CM | POA: Diagnosis not present

## 2019-09-28 NOTE — Progress Notes (Signed)
Jeffrey, Lin ([id number]) Visit Report for 09/28/2019 Arrival Information Details Patient Name: Jeffrey Lin, Jeffrey Lin Date of Service: 09/28/2019 8:00 AM Medical Record Number: [id number] Patient Account Number: [id number] Date of Birth/Sex: Jul 20, 1975 (44 y.o. M) Treating RN: Tyler Aas Primary Care Jaymond Waage: Kerby Nora Other Clinician: Referring Shonta Bourque: Kerby Nora Treating Vedika Dumlao/Extender: Linwood Dibbles, HOYT Weeks in Treatment: 1 Visit Information History Since Last Visit Added or deleted any medications: No Patient Arrived: Ambulatory Any new allergies or adverse reactions: No Arrival Time: 08:10 Had a fall or experienced change in No Accompanied By: self activities of daily living that may affect Transfer Assistance: None risk of falls: Patient Identification Verified: Yes Signs or symptoms of abuse/neglect since last visito No Secondary Verification Process Completed: Yes Hospitalized since last visit: No Patient Requires Transmission-Based Precautions: No Implantable device outside of the clinic excluding No Patient Has Alerts: No cellular tissue based products placed in the center since last visit: Has Dressing in Place as Prescribed: Yes Has Compression in Place as Prescribed: Yes Pain Present Now: Yes Electronic Signature(s) Signed: 09/28/2019 2:15:11 PM By: Dayton Martes RCP, RRT, CHT Entered By: Dayton Martes on 09/28/2019 08:11:45 Jeffrey Lin ([id number]) -------------------------------------------------------------------------------- Compression Therapy Details Patient Name: Jeffrey Lin Date of Service: 09/28/2019 8:00 AM Medical Record Number: [id number] Patient Account Number: [id number] Date of Birth/Sex: 1975-08-14 (44 y.o. M) Treating RN: Tyler Aas Primary Care Ameli Sangiovanni: Kerby Nora Other Clinician: Referring Rahman Ferrall: Kerby Nora Treating Signa Cheek/Extender: Linwood Dibbles, HOYT Weeks in Treatment:  1 Compression Therapy Performed for Wound Assessment: Wound #1 Left,Circumferential Lower Leg Performed By: Clinician Tyler Aas, RN Compression Type: Henriette Combs Post Procedure Diagnosis Same as Pre-procedure Electronic Signature(s) Signed: 09/28/2019 4:51:15 PM By: Tyler Aas Entered By: Tyler Aas on 09/28/2019 08:47:28 Jeffrey Lin ([id number]) -------------------------------------------------------------------------------- Compression Therapy Details Patient Name: Jeffrey Lin Date of Service: 09/28/2019 8:00 AM Medical Record Number: [id number] Patient Account Number: [id number] Date of Birth/Sex: [birth year] (44 y.o. M) Treating RN: Tyler Aas Primary Care Kyanna Mahrt: Kerby Nora Other Clinician: Referring Jaidah Lomax: Kerby Nora Treating Daivik Overley/Extender: Linwood Dibbles, HOYT Weeks in Treatment: 1 Compression Therapy Performed for Wound Assessment: Wound #2 Right,Circumferential Lower Leg Performed By: Clinician Tyler Aas, RN Compression Type: Henriette Combs Post Procedure Diagnosis Same as Pre-procedure Electronic Signature(s) Signed: 09/28/2019 4:51:15 PM By: Tyler Aas Entered By: Tyler Aas on 09/28/2019 08:47:29 NUMAIR, MASDEN ([id number]) -------------------------------------------------------------------------------- Encounter Discharge Information Details Patient Name: Jeffrey Lin Date of Service: 09/28/2019 8:00 AM Medical Record Number: [id number] Patient Account Number: [id number] Date of Birth/Sex: 04/24/1975 (44 y.o. M) Treating RN: Tyler Aas Primary Care Rashia Mckesson: Kerby Nora Other Clinician: Referring Montavis Schubring: Kerby Nora Treating Shahrzad Koble/Extender: Linwood Dibbles, HOYT Weeks in Treatment: 1 Encounter Discharge Information Items Discharge Condition: Stable Ambulatory Status: Ambulatory Discharge Destination: Home Transportation: Private Auto Accompanied By: self Schedule Follow-up Appointment: Yes Clinical  Summary of Care: Electronic Signature(s) Signed: 09/28/2019 4:51:15 PM By: Tyler Aas Entered By: Tyler Aas on 09/28/2019 08:48:37 Jeffrey Lin ([id number]) -------------------------------------------------------------------------------- Lower Extremity Assessment Details Patient Name: Jeffrey Lin Date of Service: 09/28/2019 8:00 AM Medical Record Number: [id number] Patient Account Number: [id number] Date of Birth/Sex: 10-17-1975 (44 y.o. M) Treating RN: Tyler Aas Primary Care Jeslin Bazinet: Kerby Nora Other Clinician: Referring Kaydence Menard: Kerby Nora Treating Anjoli Diemer/Extender: Linwood Dibbles, HOYT Weeks in Treatment: 1 Edema Assessment Assessed: [Left: Yes] [Right: Yes] Edema: [Left: Yes] [Right: Yes] Calf Left: Right: Point of Measurement: 30 cm From Medial Instep 51.3 cm 46.8 cm Ankle Left: Right: Point of Measurement: 14 cm  From Medial Instep 35 cm 33.5 cm Vascular Assessment Pulses: Dorsalis Pedis Palpable: [Left:Yes] [Right:Yes] Posterior Tibial Palpable: [Left:Yes] [Right:Yes] Electronic Signature(s) Signed: 09/28/2019 10:42:29 AM By: Benna DunksLineberry, Carol Signed: 09/28/2019 4:51:15 PM By: Tyler AasButler, Michelle Entered By: Benna DunksLineberry, Carol on 09/28/2019 08:32:58 Jeffrey GillisSTEWART, Ajani G. ([id number]) -------------------------------------------------------------------------------- Multi Wound Chart Details Patient Name: Jeffrey GillisSTEWART, Jeffrey G. Date of Service: 09/28/2019 8:00 AM Medical Record Number: [id number] Patient Account Number: [id number] Date of Birth/Sex: 07/20/1975 (44 y.o. M) Treating RN: Tyler AasButler, Michelle Primary Care Mirna Sutcliffe: Kerby NoraBedsole, Amy Other Clinician: Referring Saud [name]: Kerby NoraBedsole, Amy Treating Kaveri Perras/Extender: Linwood DibblesSTONE III, HOYT Weeks in Treatment: 1 Vital Signs Height(in): 77 Pulse(bpm): 100 Weight(lbs): 265 Blood Pressure(mmHg): 157/97 Body Mass Index(BMI): 31 Temperature(F): 97.9 Respiratory Rate(breaths/min): 20 Photos: [N/A:N/A] Wound Location: Left,  Circumferential Lower Leg Right, Circumferential Lower Leg N/A Wounding Event: Gradually Appeared Gradually Appeared N/A Primary Etiology: Lymphedema Lymphedema N/A Comorbid History: Hypertension Hypertension N/A Date Acquired: 04/22/2019 04/22/2019 N/A Weeks of Treatment: 0 0 N/A Wound Status: Open Open N/A Measurements L x W x D (cm) 28x43x0.1 28x43x0.1 N/A Area (cm) : 945.619 945.619 N/A Volume (cm) : 94.562 94.562 N/A % Reduction in Area: 0.00% 0.00% N/A % Reduction in Volume: 0.00% 0.00% N/A Classification: Partial Thickness Full Thickness Without Exposed N/A Support Structures Exudate Amount: Large Large N/A Exudate Type: Purulent Purulent N/A Exudate Color: yellow, brown, green yellow, brown, green N/A Foul Odor After Cleansing: Yes Yes N/A Odor Anticipated Due to Product No No N/A Use: Wound Margin: N/A Indistinct, nonvisible N/A Granulation Amount: None Present (0%) None Present (0%) N/A Necrotic Amount: Large (67-100%) Large (67-100%) N/A Exposed Structures: Fat Layer (Subcutaneous Tissue): Fat Layer (Subcutaneous Tissue): N/A Yes Yes Fascia: No Fascia: No Tendon: No Tendon: No Muscle: No Muscle: No Joint: No Joint: No Bone: No Bone: No Epithelialization: None N/A N/A Treatment Notes Electronic Signature(s) Signed: 09/28/2019 4:51:15 PM By: Tyler AasButler, Michelle Entered By: Tyler AasButler, Michelle on 09/28/2019 08:41:09 Jeffrey GillisSTEWART, Duwane G. ([id number]) -------------------------------------------------------------------------------- Multi-Disciplinary Care Plan Details Patient Name: Jeffrey GillisSTEWART, Xzander G. Date of Service: 09/28/2019 8:00 AM Medical Record Number: [id number] Patient Account Number: [id number] Date of Birth/Sex: 10/23/1975 (44 y.o. M) Treating RN: Tyler AasButler, Michelle Primary Care Aarna Mihalko: Kerby NoraBedsole, Amy Other Clinician: Referring Jo Cerone: Kerby NoraBedsole, Amy Treating Alcus Bradly/Extender: Linwood DibblesSTONE III, HOYT Weeks in Treatment: 1 Active Inactive Orientation to the Wound Care  Program Nursing Diagnoses: Knowledge deficit related to the wound healing center program Goals: Patient/caregiver will verbalize understanding of the Wound Healing Center Program Date Initiated: 09/20/2019 Target Resolution Date: 09/24/2019 Goal Status: Active Interventions: Provide education on orientation to the wound center Notes: Wound/Skin Impairment Nursing Diagnoses: Impaired tissue integrity Knowledge deficit related to ulceration/compromised skin integrity Goals: Patient/caregiver will verbalize understanding of skin care regimen Date Initiated: 09/20/2019 Target Resolution Date: 10/01/2019 Goal Status: Active Interventions: Assess patient/caregiver ability to obtain necessary supplies Assess patient/caregiver ability to perform ulcer/skin care regimen upon admission and as needed Assess ulceration(s) every visit Provide education on ulcer and skin care Treatment Activities: Skin care regimen initiated : 09/20/2019 Topical wound management initiated : 09/20/2019 Notes: Electronic Signature(s) Signed: 09/28/2019 4:51:15 PM By: Tyler AasButler, Michelle Entered By: Tyler AasButler, Michelle on 09/28/2019 08:41:01 Jeffrey GillisSTEWART, Muaz G. ([id number]) -------------------------------------------------------------------------------- Pain Assessment Details Patient Name: Jeffrey GillisSTEWART, Oland G. Date of Service: 09/28/2019 8:00 AM Medical Record Number: [id number] Patient Account Number: [id number] Date of Birth/Sex: 10/18/1975 (44 y.o. M) Treating RN: Tyler AasButler, Michelle Primary Care Eloise Mula: Kerby NoraBedsole, Amy Other Clinician: Referring Mirah Nevins: Kerby NoraBedsole, Amy Treating Jaydalyn Demattia/Extender: Linwood DibblesSTONE III, HOYT Weeks in Treatment: 1 Active Problems Location of Pain Severity and Description of  Pain Patient Has Paino No Site Locations With Dressing Change: No Pain Management and Medication Current Pain Management: Electronic Signature(s) Signed: 09/28/2019 10:42:29 AM By: Benna Dunks Signed: 09/28/2019 4:51:15 PM By:  Tyler Aas Entered By: Benna Dunks on 09/28/2019 08:25:56 Jeffrey Lin ([id number]) -------------------------------------------------------------------------------- Patient/Caregiver Education Details Patient Name: Jeffrey Lin Date of Service: 09/28/2019 8:00 AM Medical Record Number: [id number] Patient Account Number: [id number] Date of Birth/Gender: 1975-09-15 (44 y.o. M) Treating RN: Tyler Aas Primary Care Physician: Kerby Nora Other Clinician: Referring Physician: Kerby Nora Treating Physician/Extender: Skeet Simmer in Treatment: 1 Education Assessment Education Provided To: Patient Education Topics Provided Wound/Skin Impairment: Handouts: Caring for Your Ulcer Methods: Explain/Verbal Responses: State content correctly Electronic Signature(s) Signed: 09/28/2019 4:51:15 PM By: Tyler Aas Entered By: Tyler Aas on 09/28/2019 08:41:27 Jeffrey Lin ([id number]) -------------------------------------------------------------------------------- Wound Assessment Details Patient Name: Jeffrey Lin Date of Service: 09/28/2019 8:00 AM Medical Record Number: [id number] Patient Account Number: [id number] Date of Birth/Sex: Jul 12, 1975 (44 y.o. M) Treating RN: Tyler Aas Primary Care Rayansh Herbst: Kerby Nora Other Clinician: Referring Jaeceon Michelin: Kerby Nora Treating De Jaworski/Extender: Linwood Dibbles, HOYT Weeks in Treatment: 1 Wound Status Wound Number: 1 Primary Etiology: Lymphedema Wound Location: Left, Circumferential Lower Leg Wound Status: Open Wounding Event: Gradually Appeared Comorbid History: Hypertension Date Acquired: 04/22/2019 Weeks Of Treatment: 0 Clustered Wound: No Photos Wound Measurements Length: (cm) 28 % Reduc Width: (cm) 43 % Reduc Depth: (cm) 0.1 Epithel Area: (cm) 945.619 Volume: (cm) 94.562 tion in Area: 0% tion in Volume: 0% ialization: None Wound Description Classification: Partial Thickness Foul  Od Exudate Amount: Large Due to Exudate Type: Purulent Slough/ Exudate Color: yellow, brown, green or After Cleansing: Yes Product Use: No Fibrino Yes Wound Bed Granulation Amount: None Present (0%) Exposed Structure Necrotic Amount: Large (67-100%) Fascia Exposed: No Necrotic Quality: Adherent Slough Fat Layer (Subcutaneous Tissue) Exposed: Yes Tendon Exposed: No Muscle Exposed: No Joint Exposed: No Bone Exposed: No Treatment Notes Wound #1 (Left, Circumferential Lower Leg) Notes scell, xsorb, UBL Electronic Signature(s) Signed: 09/28/2019 10:42:29 AM By: Benna Dunks Signed: 09/28/2019 4:51:15 PM By: Joline Maxcy, Deeann Saint ([id number]) Entered By: Benna Dunks on 09/28/2019 08:31:07 Jeffrey Lin ([id number]) -------------------------------------------------------------------------------- Wound Assessment Details Patient Name: Jeffrey Lin Date of Service: 09/28/2019 8:00 AM Medical Record Number: [id number] Patient Account Number: [id number] Date of Birth/Sex: [birth year] (44 y.o. M) Treating RN: Tyler Aas Primary Care Avyukt Cimo: Kerby Nora Other Clinician: Referring Gitel Beste: Kerby Nora Treating Maleia Weems/Extender: Linwood Dibbles, HOYT Weeks in Treatment: 1 Wound Status Wound Number: 2 Primary Etiology: Lymphedema Wound Location: Right, Circumferential Lower Leg Wound Status: Open Wounding Event: Gradually Appeared Comorbid History: Hypertension Date Acquired: 04/22/2019 Weeks Of Treatment: 0 Clustered Wound: No Photos Wound Measurements Length: (cm) 28 Width: (cm) 43 Depth: (cm) 0.1 Area: (cm) 945.619 Volume: (cm) 94.562 % Reduction in Area: 0% % Reduction in Volume: 0% Wound Description Classification: Full Thickness Without Exposed Support Struct Wound Margin: Indistinct, nonvisible Exudate Amount: Large Exudate Type: Purulent Exudate Color: yellow, brown, green ures Foul Odor After Cleansing: Yes Due to Product Use:  No Slough/Fibrino Yes Wound Bed Granulation Amount: None Present (0%) Exposed Structure Necrotic Amount: Large (67-100%) Fascia Exposed: No Necrotic Quality: Adherent Slough Fat Layer (Subcutaneous Tissue) Exposed: Yes Tendon Exposed: No Muscle Exposed: No Joint Exposed: No Bone Exposed: No Treatment Notes Wound #2 (Right, Circumferential Lower Leg) Notes scell, xsorb, UBL Electronic Signature(s) Signed: 09/28/2019 10:42:29 AM By: Charma Igo ([id number]) Signed: 09/28/2019 4:51:15 PM By: Tyler Aas Entered  By: Benna Dunks on 09/28/2019 08:31:35 Jeffrey Lin ([id number]) -------------------------------------------------------------------------------- Vitals Details Patient Name: Jeffrey Lin Date of Service: 09/28/2019 8:00 AM Medical Record Number: [id number] Patient Account Number: [id number] Date of Birth/Sex: 1975-02-27 (44 y.o. M) Treating RN: Tyler Aas Primary Care Corleone Biegler: Kerby Nora Other Clinician: Referring Alexandro Line: Kerby Nora Treating Madelline Eshbach/Extender: Linwood Dibbles, HOYT Weeks in Treatment: 1 Vital Signs Time Taken: 08:10 Temperature (F): 97.9 Height (in): 77 Pulse (bpm): 100 Weight (lbs): 265 Respiratory Rate (breaths/min): 20 Body Mass Index (BMI): 31.4 Blood Pressure (mmHg): 157/97 Reference Range: 80 - 120 mg / dl Electronic Signature(s) Signed: 09/28/2019 2:15:11 PM By: Dayton Martes RCP, RRT, CHT Entered By: Dayton Martes on 09/28/2019 08:12:56

## 2019-09-28 NOTE — Progress Notes (Addendum)
Jeffrey Lin, Hillman G. ([id number]) Visit Report for 09/28/2019 Chief Complaint Document Details Patient Name: Jeffrey Lin, Jeffrey G. Date of Service: 09/28/2019 8:00 AM Medical Record Number: [id number] Patient Account Number: [id number] Date of Birth/Sex: 12/31/1975 (44 y.o. M) Treating RN: Tyler AasButler, Michelle Primary Care Provider: Kerby NoraBedsole, Amy Other Clinician: Referring Provider: Kerby NoraBedsole, Amy Treating Provider/Extender: Linwood DibblesSTONE III, Ivette Castronova Weeks in Treatment: 1 Information Obtained from: Patient Chief Complaint Bilateral LE Lymphedema Electronic Signature(s) Signed: 09/28/2019 8:39:40 AM By: Lenda KelpStone III, Monzerrath Mcburney PA-C Entered By: Lenda KelpStone III, Jaylan Hinojosa on 09/28/2019 08:39:40 Jeffrey Lin, Jeffrey G. ([id number]) -------------------------------------------------------------------------------- HPI Details Patient Name: Jeffrey Lin, Jeffrey G. Date of Service: 09/28/2019 8:00 AM Medical Record Number: [id number] Patient Account Number: [id number] Date of Birth/Sex: 03/17/1975 (44 y.o. M) Treating RN: Tyler AasButler, Michelle Primary Care Provider: Kerby NoraBedsole, Amy Other Clinician: Referring Provider: Kerby NoraBedsole, Amy Treating Provider/Extender: Linwood DibblesSTONE III, Kanitra Purifoy Weeks in Treatment: 1 History of Present Illness HPI Description: 09/20/2019 upon evaluation today patient presents for initial evaluation in our clinic concerning issues that he has been having with wounds over the bilateral lower extremities. Both legs seem to be fairly equally affected at this point based on what I am seeing. With that being said the patient also appears to be having some drainage which is somewhat blue/green in nature and does have me concerned about the possibility of infection. I explained this often can indicate a Pseudomonas infection despite the fact that the patient has been on antibiotics currently this could still be problematic. He currently does not have home health that is something that we did discussed the potential for looking into. I think that could be  beneficial for him to be honest. The patient seems somewhat frustrated with the situation in general. He does have a history of lymphedema, hypertension, and he does ambulate and walk though not a lot and tells me that he really does not drive unless is absolutely forced for something he absolutely has to do otherwise he is essentially homebound at this point. He did have a believe his mother with him today. 09/28/2019 on evaluation today patient appears to be doing better in regard to his leg ulcers at this point. The antibiotic does seem to be helping and overall he tells me that his pain is less though he still is having discomfort. Fortunately there is no signs of active infection at this time. Electronic Signature(s) Signed: 09/29/2019 5:47:41 PM By: Lenda KelpStone III, Hanifa Antonetti PA-C Entered By: Lenda KelpStone III, Teanna Elem on 09/29/2019 17:47:41 Jeffrey Lin, Jeffrey G. ([id number]) -------------------------------------------------------------------------------- Physical Exam Details Patient Name: Jeffrey Lin, Jeffrey G. Date of Service: 09/28/2019 8:00 AM Medical Record Number: [id number] Patient Account Number: [id number] Date of Birth/Sex: 10/24/1975 (44 y.o. M) Treating RN: Tyler AasButler, Michelle Primary Care Provider: Kerby NoraBedsole, Amy Other Clinician: Referring Provider: Kerby NoraBedsole, Amy Treating Provider/Extender: Linwood DibblesSTONE III, Danica Camarena Weeks in Treatment: 1 Constitutional Well-nourished and well-hydrated in no acute distress. Respiratory normal breathing without difficulty. Psychiatric this patient is able to make decisions and demonstrates good insight into disease process. Alert and Oriented x 3. pleasant and cooperative. Notes Patient's wounds currently again are showing signs of resolution I do believe that he has been benefited by the compression wraps also think that the antibiotic has made a dramatic improvement for him. Overall very pleased with where things stand today. Electronic Signature(s) Signed: 09/29/2019 5:47:58 PM By: Lenda KelpStone  III, Toretto Tingler PA-C Entered By: Lenda KelpStone III, Japneet Staggs on 09/29/2019 17:47:57 Jeffrey Lin, Jeffrey G. ([id number]) -------------------------------------------------------------------------------- Physician Orders Details Patient Name: Jeffrey Lin, Jeffrey G. Date of Service: 09/28/2019 8:00 AM Medical Record Number: [id number] Patient Account  Number: [id number] Date of Birth/Sex: 05/08/1975 (44 y.o. M) Treating RN: Tyler Aas Primary Care Provider: Kerby Nora Other Clinician: Referring Provider: Kerby Nora Treating Provider/Extender: Linwood Dibbles, Ja Ohman Weeks in Treatment: 1 Verbal / Phone Orders: No Diagnosis Coding ICD-10 Coding Code Description I89.0 Lymphedema, not elsewhere classified L97.821 Non-pressure chronic ulcer of other part of left lower leg limited to breakdown of skin L97.811 Non-pressure chronic ulcer of other part of right lower leg limited to breakdown of skin I10 Essential (primary) hypertension Wound Cleansing o Clean wound with Normal Saline. Primary Wound Dressing o Silver Alginate o XtraSorb Dressing Change Frequency o Change dressing every week Follow-up Appointments o Return Appointment in 1 week. o Nurse Visit as needed Edema Control o Unna Boots Bilaterally o Elevate legs to the level of the heart and pump ankles as often as possible Notes continue Cipro and Augmentin and complete Electronic Signature(s) Signed: 09/28/2019 4:51:15 PM By: Tyler Aas Signed: 09/28/2019 5:49:40 PM By: Lenda Kelp PA-C Entered By: Tyler Aas on 09/28/2019 08:49:38 Jeffrey Lin, Jeffrey Lin ([id number]) -------------------------------------------------------------------------------- Problem List Details Patient Name: Jeffrey Lin Date of Service: 09/28/2019 8:00 AM Medical Record Number: [id number] Patient Account Number: [id number] Date of Birth/Sex: 1975-07-02 (44 y.o. M) Treating RN: Tyler Aas Primary Care Provider: Kerby Nora Other Clinician: Referring  Provider: Kerby Nora Treating Provider/Extender: Linwood Dibbles, Tawni Melkonian Weeks in Treatment: 1 Active Problems ICD-10 Encounter Code Description Active Date MDM Diagnosis I89.0 Lymphedema, not elsewhere classified 09/20/2019 No Yes L97.821 Non-pressure chronic ulcer of other part of left lower leg limited to 09/20/2019 No Yes breakdown of skin L97.811 Non-pressure chronic ulcer of other part of right lower leg limited to 09/20/2019 No Yes breakdown of skin I10 Essential (primary) hypertension 09/20/2019 No Yes Inactive Problems Resolved Problems Electronic Signature(s) Signed: 09/28/2019 8:39:34 AM By: Lenda Kelp PA-C Entered By: Lenda Kelp on 09/28/2019 08:39:34 Jeffrey Lin, Jeffrey Lin ([id number]) -------------------------------------------------------------------------------- Progress Note Details Patient Name: Jeffrey Lin Date of Service: 09/28/2019 8:00 AM Medical Record Number: [id number] Patient Account Number: [id number] Date of Birth/Sex: 19-May-1975 (44 y.o. M) Treating RN: Tyler Aas Primary Care Provider: Kerby Nora Other Clinician: Referring Provider: Kerby Nora Treating Provider/Extender: Linwood Dibbles, Shalayne Leach Weeks in Treatment: 1 Subjective Chief Complaint Information obtained from Patient Bilateral LE Lymphedema History of Present Illness (HPI) 09/20/2019 upon evaluation today patient presents for initial evaluation in our clinic concerning issues that he has been having with wounds over the bilateral lower extremities. Both legs seem to be fairly equally affected at this point based on what I am seeing. With that being said the patient also appears to be having some drainage which is somewhat blue/green in nature and does have me concerned about the possibility of infection. I explained this often can indicate a Pseudomonas infection despite the fact that the patient has been on antibiotics currently this could still be problematic. He currently does not have home  health that is something that we did discussed the potential for looking into. I think that could be beneficial for him to be honest. The patient seems somewhat frustrated with the situation in general. He does have a history of lymphedema, hypertension, and he does ambulate and walk though not a lot and tells me that he really does not drive unless is absolutely forced for something he absolutely has to do otherwise he is essentially homebound at this point. He did have a believe his mother with him today. 09/28/2019 on evaluation today patient appears to be doing better in regard  to his leg ulcers at this point. The antibiotic does seem to be helping and overall he tells me that his pain is less though he still is having discomfort. Fortunately there is no signs of active infection at this time. Objective Constitutional Well-nourished and well-hydrated in no acute distress. Vitals Time Taken: 8:10 AM, Height: 77 in, Weight: 265 lbs, BMI: 31.4, Temperature: 97.9 F, Pulse: 100 bpm, Respiratory Rate: 20 breaths/min, Blood Pressure: 157/97 mmHg. Respiratory normal breathing without difficulty. Psychiatric this patient is able to make decisions and demonstrates good insight into disease process. Alert and Oriented x 3. pleasant and cooperative. General Notes: Patient's wounds currently again are showing signs of resolution I do believe that he has been benefited by the compression wraps also think that the antibiotic has made a dramatic improvement for him. Overall very pleased with where things stand today. Integumentary (Hair, Skin) Wound #1 status is Open. Original cause of wound was Gradually Appeared. The wound is located on the Left,Circumferential Lower Leg. The wound measures 28cm length x 43cm width x 0.1cm depth; 945.619cm^2 area and 94.562cm^3 volume. There is Fat Layer (Subcutaneous Tissue) exposed. There is a large amount of purulent drainage noted. Foul odor after cleansing was noted.  There is no granulation within the wound bed. There is a large (67-100%) amount of necrotic tissue within the wound bed including Adherent Slough. Wound #2 status is Open. Original cause of wound was Gradually Appeared. The wound is located on the Right,Circumferential Lower Leg. The wound measures 28cm length x 43cm width x 0.1cm depth; 945.619cm^2 area and 94.562cm^3 volume. There is Fat Layer (Subcutaneous Tissue) exposed. There is a large amount of purulent drainage noted. Foul odor after cleansing was noted. The wound margin is indistinct and nonvisible. There is no granulation within the wound bed. There is a large (67-100%) amount of necrotic tissue within the wound bed including Adherent Slough. 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 There was a Radio broadcast assistant Compression Therapy Procedure by Tyler Aas, RN. Post procedure Diagnosis Wound #1: Same as Pre-Procedure Wound #2 Pre-procedure diagnosis of Wound #2 is a Lymphedema located on the Right,Circumferential Lower Leg . There was a Radio broadcast assistant Compression Therapy Procedure by Tyler Aas, RN. Post procedure Diagnosis Wound #2: Same as Pre-Procedure Plan Wound Cleansing: Clean wound with Normal Saline. Primary Wound Dressing: Silver Alginate XtraSorb Dressing Change Frequency: Change dressing every week Follow-up Appointments: Return Appointment in 1 week. Nurse Visit as needed Edema Control: Warehouse manager Bilaterally Elevate legs to the level of the heart and pump ankles as often as possible General Notes: continue Cipro and  Augmentin and complete 1. I am going to suggest at this time that we go ahead and continue with the wound care measures as before and the patient is in agreement with that plan. This includes the use of silver alginate and XtraSorb over any open and weeping areas. 2. I am also can recommend that the patient continue with the bilateral Unna boot wraps that have been beneficial to this point. 3. I am also going to suggest he continue to elevate his legs is much as possible to try to help out. He is on  Cipro and Augmentin both of which he should complete fully. We will see patient back for reevaluation in 1 week here in the clinic. If anything worsens or changes patient will contact our office for additional recommendations. Electronic Signature(s) Signed: 09/29/2019 5:48:46 PM By: Lenda Kelp PA-C Entered By: Lenda Kelp on 09/29/2019 17:48:45 NICOLE, HAFLEY ([id number]) -------------------------------------------------------------------------------- SuperBill Details Patient Name: Jeffrey Lin Date of Service: 09/28/2019 Medical Record Number: [id number] Patient Account Number: [id number] Date of Birth/Sex: [birth year] (44 y.o. M) Treating RN: Tyler Aas Primary Care Provider: Kerby Nora Other Clinician: Referring Provider: Kerby Nora Treating Provider/Extender: Linwood Dibbles, Khaidyn Staebell Weeks in Treatment: 1 Diagnosis Coding ICD-10 Codes Code Description I89.0 Lymphedema, not elsewhere classified L97.821 Non-pressure chronic ulcer of other part of left lower leg limited to breakdown of skin L97.811 Non-pressure chronic ulcer of other part of right lower leg limited to breakdown of skin I10 Essential (primary) hypertension Facility Procedures CPT4: Description Modifier Quantity Code [id number] BILATERAL: Application of multi-layer venous compression system; leg (below knee), including 1 ankle and foot. Physician Procedures CPT4 Code: 9201007 Description: 99213 - WC PHYS  LEVEL 3 - EST PT Modifier: Quantity: 1 CPT4 Code: Description: ICD-10 Diagnosis Description I89.0 Lymphedema, not elsewhere classified L97.821 Non-pressure chronic ulcer of other part of left lower leg limited to brea L97.811 Non-pressure chronic ulcer of other part of right lower leg limited to bre I10  Essential (primary) hypertension Modifier: kdown of skin akdown of skin Quantity: Electronic Signature(s) Signed: 09/29/2019 5:49:17 PM By: Lenda Kelp PA-C Previous Signature: 09/28/2019 4:51:15 PM Version By: Tyler Aas Previous Signature: 09/28/2019 5:49:40 PM Version By: Lenda Kelp PA-C Entered By: Lenda Kelp on 09/29/2019 17:49:17

## 2019-09-30 ENCOUNTER — Other Ambulatory Visit: Payer: Self-pay

## 2019-09-30 ENCOUNTER — Encounter: Payer: Self-pay | Admitting: Family

## 2019-09-30 ENCOUNTER — Ambulatory Visit (INDEPENDENT_AMBULATORY_CARE_PROVIDER_SITE_OTHER): Payer: PPO | Admitting: Family

## 2019-09-30 VITALS — BP 130/80 | HR 110 | Ht 77.0 in | Wt 276.0 lb

## 2019-09-30 DIAGNOSIS — I890 Lymphedema, not elsewhere classified: Secondary | ICD-10-CM

## 2019-09-30 DIAGNOSIS — I5032 Chronic diastolic (congestive) heart failure: Secondary | ICD-10-CM

## 2019-09-30 DIAGNOSIS — I10 Essential (primary) hypertension: Secondary | ICD-10-CM | POA: Diagnosis not present

## 2019-09-30 DIAGNOSIS — R000 Tachycardia, unspecified: Secondary | ICD-10-CM

## 2019-09-30 DIAGNOSIS — Z720 Tobacco use: Secondary | ICD-10-CM | POA: Diagnosis not present

## 2019-09-30 MED ORDER — CARVEDILOL 25 MG PO TABS: 37.5000 mg | ORAL_TABLET | Freq: Two times a day (BID) | ORAL | 1 refills | Status: DC

## 2019-09-30 NOTE — Patient Instructions (Signed)
Medication Instructions:  Your physician has recommended you make the following change in your medication:   CHANGE Carvedilol to 37.5 mg (1.5 tablets) twice daily  *If you need a refill on your cardiac medications before your next appointment, please call your pharmacy*   Lab Work: No lab work today.  Testing/Procedures: No testing ordered today.   Follow-Up: At [facility], you and your health needs are our priority.  As part of our continuing mission to provide you with exceptional heart care, we have created designated Provider Care Teams.  These Care Teams include your primary Cardiologist (physician) and Advanced Practice Providers (APPs -  Physician Assistants and Nurse Practitioners) who all work together to provide you with the care you need, when you need it.  We recommend signing up for the patient portal called "MyChart".  Sign up information is provided on this After Visit Summary.  MyChart is used to connect with patients for Virtual Visits (Telemedicine).  Patients are able to view lab/test results, encounter notes, upcoming appointments, etc.  Non-urgent messages can be sent to your provider as well.   To learn more about what you can do with MyChart, go to [email or web address].    Your next appointment:   3 month(s)  The format for your next appointment:   In Person  Provider:   You may see Lorine Bears, MD or one of the following Advanced Practice Providers on your designated Care Team:    Nicolasa Ducking, NP  Eula Listen, PA-C  Gillian Shields, NP  Marisue Ivan, PA-C  Other Instructions  We will reach out to the company about your lymphedema pumps. We have ordered them and sent the paperwork to the company.

## 2019-09-30 NOTE — Progress Notes (Signed)
Office Visit    Patient Name: Jeffrey Lin Date of Encounter: 09/30/2019  Primary Care Provider:  Excell SeltzerBedsole, Amy E, MD Primary Cardiologist:  Lorine BearsMuhammad Arida, MD Electrophysiologist:  None   Chief Complaint    Jeffrey Lin is a 44 y.o. male with a hx of lymphedema, venous insufficiency, HTN, mild pulmonary hypertension, diastolic heart failure, schizoaffective disorder, obesity, tobacco use presents today for follow-up of diastolic HF and lower extremity edema.  Past Medical History    Past Medical History:  Diagnosis Date  . Anxiety   . Chronic leg pain   . Depression   . Essential hypertension, benign 10/20/2015  . Schizoaffective disorder [facility](HCC)    Past Surgical History:  Procedure Laterality Date  . Block procedure at pain center  03/27/06  . Bone graft from hip to jaw  2004  . Bone graft right side of head to jaw  2006  . 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 RIGHT HEART CATH AND CORONARY ANGIOGRAPHY Right 08/02/2019   Procedure: RIGHT HEART CATH AND CORONARY ANGIOGRAPHY;  Surgeon: Iran OuchArida, Muhammad A, MD;  Location: [facility] INVASIVE CV LAB;  Service: Cardiovascular;  Laterality: Right;  . Sphenopalatine ganglionic     Allergies Allergies  Allergen Reactions  . Toradol [Ketorolac Tromethamine] Swelling  . Lamotrigine Rash   History of Present Illness    Jeffrey Lin is a 44 y.o. male with a hx of lymphedema, venous insufficiency, HTN, mild pulmonary hypertension, diastolic heart failure, schizoaffective disorder, obesity, tobacco use last seen 09/02/19.  Seen in consult by Dr. Kirke CorinArida 07/30/2019 for tachycardia and shortness of breath.  Previously seen by Dr. Tenny Crawoss in 2016 for atypical chest pain which was pleuritic and felt to musculoskeletal.  Echo in 2018 with normal LV diastolic and systolic function.  [facility]/2018 with respiratory failure thought to be due to pneumonia.  Treated for sepsis at that time.  Noted left pleural effusion treated with  thoracentesis.  Metabolic encephalopathy at that time as well.  Rehospitalized April 2021 with lower extremity cellulitis and edema.  BNP normal.  Echo with normal LVEF with no significant valvular abnormalities.  RV dilated but pulmonary pressures cannot be elevated.  Lower extremity venous Doppler negative for DVT.  Improved with antibiotics.  When seen in consultation he noted continued bilateral leg edema with blisters oozing fluids and significant stasis dermatitis.  He had gradual weight gain with peak of 333 pounds but improved with torsemide to weight of 301 pounds.  He was recommended for right heart catheterization to evaluate volume status and guide therapy.  Seen in clinic 07/30/19 with noted continued severe swelling and blisters to bilateral lower extremity with lymphedema and severe chronic venous insufficiency.  He was not having improvement in symptoms despite using antibiotics, knee-high support stockings, leg elevation, walking exercise regimen.  Underwent right heart cath 08/02/2019 showing normal filling pressures with mild pulmonary hypertension high cardiac output.  No evidence of significant volume overload.  His torsemide was decreased to 40 mg daily.  He was noted to have mild pulmonary hypertension which was presumed due to untreated sleep apnea.   At clinic visit 09/02/19 his Propranolol was changed to Carvedilol for optimal control of BP. Lymphedema pumps were also ordered.  He is following with wound care.  He has been started on antibiotics 09/20/2019 due to positive blood cultures.  He was scheduled for at home sleep study since last seen but was unable to tolerate the home test  and declined to schedule lab study.  Presents today for follow-up.  Tells me he has not received lymphedema pumps.  Notes his blood pressure is better controlled since transition from propranolol to carvedilol.  He denies palpitations.  Reports his dyspnea on exertion is stable at baseline.  He  continues to eat a low-sodium diet and restrict his fluid.  He enjoys painting in a spare time.  Tells me his left eye is having blurry vision. He tells me this started about the same time his health problems started. Tells me this happens on intermittent days and is limited to his left eye. He tells me he is due for an eye doctor appointment and is planning to schedule. Last eye visit 3 years ago.  Encouraged to schedule Checking blood pressure periodically during the week. Tells me the blood pressure is routinely in the 130s systolic over 80s diastolic.   Reports no shortness of breath at rest. Reports no chest pain, pressure, or tightness. No orthopnea, PND. Reports no palpitations.   EKGs/Labs/Other Studies Reviewed:   The following studies were reviewed today:  Echo 05/16/19  1. Left ventricular ejection fraction, by estimation, is 60 to 65%. The  left ventricle has normal function. The left ventricle has no regional  wall motion abnormalities. Left ventricular diastolic parameters were  normal.   2. Right ventricular systolic function is moderately reduced. The right  ventricular size is moderately enlarged.   3. Right atrial size was moderately dilated.   4. The mitral valve is normal in structure. No evidence of mitral valve  regurgitation. No evidence of mitral stenosis.   5. The aortic valve is tricuspid. Aortic valve regurgitation is not  visualized. No aortic stenosis is present.   6. The inferior vena cava is normal in size with greater than 50%  respiratory variability, suggesting right atrial pressure of 3 mmHg.   RHC 07/2019 Successful right heart catheterization via the right antecubital vein. Normal filling pressures with mild pulmonary hypertension and high cardiac output.   RA: 2 mmHg RV: 39 / 1 mmHg PCW: 7 mmHg PA: 39/15 with a mean of 26 mmHg Cardiac output: 9.1 with a cardiac index of 3.4   Recommendations: No evidence of significant volume overload.   Decrease torsemide to 40 mg once daily. The patient does have mild pulmonary hypertension likely due to untreated sleep apnea.  EKG: No EKG today.    Recent Labs: 05/07/2019: TSH 1.00 05/17/2019: Magnesium 1.7 06/10/2019: B Natriuretic Peptide 25.0 07/27/2019: Pro B Natriuretic peptide (BNP) 37.0 07/30/2019: Hemoglobin 15.9; Platelets 271 08/12/2019: ALT 37; BUN 9; Creatinine, Ser 1.06; Potassium 3.8; Sodium 137  Recent Lipid Panel    Component Value Date/Time   CHOL 201 (H) 08/12/2019 0826   TRIG 187.0 (H) 08/12/2019 0826   HDL 32.40 (L) 08/12/2019 0826   CHOLHDL 6 08/12/2019 0826   VLDL 37.4 08/12/2019 0826   LDLCALC 131 (H) 08/12/2019 0826   LDLDIRECT 111.0 08/05/2018 1003   Home Medications   Current Meds  Medication Sig  . amantadine (SYMMETREL) 100 MG capsule Take 100 mg by mouth 2 (two) times daily.  Marland Kitchen amphetamine-dextroamphetamine (ADDERALL) 20 MG tablet Take 20 mg by mouth in the morning, at noon, in the evening, and at bedtime.   Marland Kitchen atorvastatin (LIPITOR) 40 MG tablet TAKE 1 TABLET BY MOUTH EVERY DAY  . carvedilol (COREG) 25 MG tablet Take 1 tablet (25 mg total) by mouth 2 (two) times daily.  . diazepam (VALIUM) 10 MG tablet Take 10  mg by mouth 4 (four) times daily.  Marland Kitchen gabapentin (NEURONTIN) 600 MG tablet Take 1,200 mg by mouth 3 (three) times daily.   Marland Kitchen losartan (COZAAR) 100 MG tablet Take 0.5 tablets (50 mg total) by mouth daily. (Patient taking differently: Take 100 mg by mouth daily. )  . mupirocin ointment (BACTROBAN) 2 % Apply 1 application topically 2 (two) times daily. After soaks, to left great toenail site  . omeprazole (PRILOSEC) 20 MG capsule Take 20 mg by mouth daily as needed.  . polyethylene glycol powder (MIRALAX) 17 GM/SCOOP powder Take 17 g by mouth 2 (two) times daily as needed for moderate constipation.  . potassium chloride (KLOR-CON) 10 MEQ tablet TAKE 1 TABLET BY MOUTH EVERY DAY  . risperiDONE (RISPERDAL) 2 MG tablet Take 2 mg by mouth at bedtime.  .  senna-docusate (SENOKOT-S) 8.6-50 MG tablet Take 1 tablet by mouth 2 (two) times daily as needed for moderate constipation.  . tamsulosin (FLOMAX) 0.4 MG CAPS capsule Take 1 capsule (0.4 mg total) by mouth daily.  . tizanidine (ZANAFLEX) 2 MG capsule TAKE 1 CAPSULE BY MOUTH 3 TIMES DAILY AS NEEDED FOR MUSCLE SPASMS.  Marland Kitchen torsemide (DEMADEX) 20 MG tablet Take 2 tablets (40 mg total) by mouth daily.  . Vitamin D, Ergocalciferol, (DRISDOL) 50000 units CAPS capsule Take 1 capsule (50,000 Units total) by mouth 2 (two) times a week.     Review of Systems   Review of Systems  Constitutional: Negative for chills, fever and malaise/fatigue.  Cardiovascular: Positive for dyspnea on exertion and leg swelling. Negative for chest pain, irregular heartbeat, near-syncope, orthopnea, palpitations and syncope.  Respiratory: Negative for cough, shortness of breath and wheezing.   Gastrointestinal: Negative for melena, nausea and vomiting.  Genitourinary: Negative for hematuria.  Neurological: Negative for dizziness, light-headedness and weakness.    All other systems reviewed and are otherwise negative except as noted above.  Physical Exam    VS:  BP 130/80 (BP Location: Left Arm, Patient Position: Sitting, Cuff Size: Normal)   Pulse (!) 110   Ht 6\' 5"  (1.956 m)   Wt 276 lb (125.2 kg)   SpO2 97%   BMI 32.73 kg/m  , BMI Body mass index is 32.73 kg/m. GEN: Well nourished, overweight, well developed, in no acute distress. HEENT: normal. Neck: Lin, no JVD, carotid bruits, or masses. Cardiac: RRR, no murmurs, rubs, or gallops. No clubbing, cyanosis.   Radials/DP/PT 2+ and equal bilaterally.  Bilateral lower extremities with wraps placed by wound clinic.   Respiratory:  Respirations regular and unlabored, clear to auscultation bilaterally. GI: Soft, nontender, nondistended. MS: No deformity or atrophy. Skin: Warm and dry, no rash. Neuro:  Strength and sensation are intact. Psych: Flat  affect.  Assessment & Plan    1. Chronic diastolic heart failure/mild PAH - RHC 08/02/2019 with normal filling pressures with mild pulmonary hypertension and high cardiac output.  GDMT includes carvedilol, losartan, torsemide.  Continue present doses.  Likely sleep apnea due to body habitus, snoring but was unable to tolerate home sleep study and has declined in lab study.  2. Sinus tachycardia -likely multifactorial obesity, deconditioning, infection, adderall use.  Asymptomatic with no palpitations.  Increase carvedilol to 37.5 mg twice daily.  3. Lower extremity lymphedema severe chronic venous insufficiency and ulceration - Noted stage III-IV lymphedema. He has had recurrent cellulitis, severe 3+ lower extremity pitting edema, and blisters dating back to April 2021.  He has been evaluated for the symptoms 07/30/2019, 09/02/2019, and today.  He  has failed conservative measures including antibiotics, knee-high support stockings, leg elevation, walking regimen.  Lymphedema pumps were ordered but not received that patient will reach out to the company. He will continue conservative measures in the interim.  He will continue to follow with the wound clinic-recently started on antibiotics for positive blood cultures.  4. HTN -blood pressures have improved compared to previous but not yet at goal of less than 130/80.  Increase carvedilol to 37.5 mg twice daily.  5. HLD - Lipid profile 08/12/2019 with LDL 131. Continue Atorvastatin.  Will likely benefit from increased dose of atorvastatin in the future.  Will defer to primary care provider.  6. Tobacco use - Presently working on cutting back. Not yet ready to quit. Smoking cessation encouraged. Recommend utilization of [id number].  Disposition: Follow up in 3 month(s) with Dr. Kirke Corin or APP  Alver Sorrow, NP 09/30/2019, 9:04 AM

## 2019-10-04 ENCOUNTER — Encounter: Payer: PPO | Admitting: Physician Assistant

## 2019-10-04 ENCOUNTER — Other Ambulatory Visit: Payer: Self-pay

## 2019-10-04 DIAGNOSIS — L97821 Non-pressure chronic ulcer of other part of left lower leg limited to breakdown of skin: Secondary | ICD-10-CM | POA: Diagnosis not present

## 2019-10-04 DIAGNOSIS — L97811 Non-pressure chronic ulcer of other part of right lower leg limited to breakdown of skin: Secondary | ICD-10-CM | POA: Diagnosis not present

## 2019-10-04 DIAGNOSIS — I10 Essential (primary) hypertension: Secondary | ICD-10-CM | POA: Diagnosis not present

## 2019-10-04 DIAGNOSIS — I890 Lymphedema, not elsewhere classified: Secondary | ICD-10-CM | POA: Diagnosis not present

## 2019-10-04 NOTE — Assessment & Plan Note (Signed)
Remains fluid overload but significant improvement with torsemide.

## 2019-10-04 NOTE — Assessment & Plan Note (Signed)
stable per psychiatry.

## 2019-10-04 NOTE — Assessment & Plan Note (Signed)
IMproved control on new regimen.

## 2019-10-04 NOTE — Progress Notes (Addendum)
Jeffrey Lin, Jeffrey Lin ([id number]) Visit Report for 10/04/2019 Chief Complaint Document Details Patient Name: Jeffrey Lin, Jeffrey Lin Date of Service: 10/04/2019 8:15 AM Medical Record Number: [id number] Patient Account Number: [id number] Date of Birth/Sex: 01/08/1976 (44 y.o. M) Treating RN: Primary Care Provider: Kerby Nora Other Clinician: Referring Provider: Kerby Nora Treating Provider/Extender: Linwood Dibbles, Robi Dewolfe Weeks in Treatment: 2 Information Obtained from: Patient Chief Complaint Bilateral LE Lymphedema Electronic Signature(s) Signed: 10/04/2019 8:30:04 AM By: Lenda Kelp PA-C Entered By: Lenda Kelp on 10/04/2019 08:30:03 Jeffrey Lin, Jeffrey Lin ([id number]) -------------------------------------------------------------------------------- HPI Details Patient Name: Jeffrey Lin Date of Service: 10/04/2019 8:15 AM Medical Record Number: [id number] Patient Account Number: [id number] Date of Birth/Sex: 1975/04/13 (44 y.o. M) Treating RN: Primary Care Provider: Kerby Nora Other Clinician: Referring Provider: Kerby Nora Treating Provider/Extender: Linwood Dibbles, Stefanee Mckell Weeks in Treatment: 2 History of Present Illness HPI Description: 09/20/2019 upon evaluation today patient presents for initial evaluation in our clinic concerning issues that he has been having with wounds over the bilateral lower extremities. Both legs seem to be fairly equally affected at this point based on what I am seeing. With that being said the patient also appears to be having some drainage which is somewhat blue/green in nature and does have me concerned about the possibility of infection. I explained this often can indicate a Pseudomonas infection despite the fact that the patient has been on antibiotics currently this could still be problematic. He currently does not have home health that is something that we did discussed the potential for looking into. I think that could be beneficial for him to be honest. The  patient seems somewhat frustrated with the situation in general. He does have a history of lymphedema, hypertension, and he does ambulate and walk though not a lot and tells me that he really does not drive unless is absolutely forced for something he absolutely has to do otherwise he is essentially homebound at this point. He did have a believe his mother with him today. 09/28/2019 on evaluation today patient appears to be doing better in regard to his leg ulcers at this point. The antibiotic does seem to be helping and overall he tells me that his pain is less though he still is having discomfort. Fortunately there is no signs of active infection at this time. 10/04/2019 upon evaluation today patient appears to be doing well at this point in regard to his wounds. He in fact is improving dramatically from the standpoint of his infection which I think is under great control. I see no evidence of infection systemically at this point and overall I am extremely pleased I think he is getting close to resolution to be honest though he still has several scattered areas at this point. Electronic Signature(s) Signed: 10/04/2019 9:24:10 AM By: Lenda Kelp PA-C Entered By: Lenda Kelp on 10/04/2019 09:24:10 Jeffrey Lin, Jeffrey Lin ([id number]) -------------------------------------------------------------------------------- Physical Exam Details Patient Name: Jeffrey Lin Date of Service: 10/04/2019 8:15 AM Medical Record Number: [id number] Patient Account Number: [id number] Date of Birth/Sex: 29-Apr-1975 (44 y.o. M) Treating RN: Tyler Aas Primary Care Provider: Kerby Nora Other Clinician: Referring Provider: Kerby Nora Treating Provider/Extender: Linwood Dibbles, Georgianna Band Weeks in Treatment: 2 Constitutional Obese and well-hydrated in no acute distress. Respiratory normal breathing without difficulty. Psychiatric this patient is able to make decisions and demonstrates good insight into disease  process. Alert and Oriented x 3. pleasant and cooperative. Notes His wound bed actually showed signs of good granulation at this time at all wound  locations medially actually healed but had some dry drainage crusted over the surface of where this had healed. Overall I am extremely pleased with where things stand and the patient is happy to hear this. Electronic Signature(s) Signed: 10/04/2019 9:24:26 AM By: Lenda KelpStone Lin, Jeffrey Wooden PA-C Entered By: Lenda KelpStone Lin, Gean Larose on 10/04/2019 09:24:26 Jeffrey GillisSTEWART, Jeffrey G. ([id number]) -------------------------------------------------------------------------------- Physician Orders Details Patient Name: Jeffrey GillisSTEWART, Jeffrey G. Date of Service: 10/04/2019 8:15 AM Medical Record Number: [id number] Patient Account Number: [id number] Date of Birth/Sex: 09/30/1975 (44 y.o. M) Treating RN: Tyler AasButler, Michelle Primary Care Provider: Kerby NoraBedsole, Amy Other Clinician: Referring Provider: Kerby NoraBedsole, Amy Treating Provider/Extender: Linwood DibblesSTONE Lin, Katiana Ruland Weeks in Treatment: 2 Verbal / Phone Orders: No Diagnosis Coding ICD-10 Coding Code Description I89.0 Lymphedema, not elsewhere classified L97.821 Non-pressure chronic ulcer of other part of left lower leg limited to breakdown of skin L97.811 Non-pressure chronic ulcer of other part of right lower leg limited to breakdown of skin I10 Essential (primary) hypertension Wound Cleansing o Clean wound with Normal Saline. Primary Wound Dressing o Silver Alginate Secondary Dressing Wound #1 Left,Circumferential Lower Leg o XtraSorb Wound #2 Right,Circumferential Lower Leg o XtraSorb Dressing Change Frequency o Change dressing every week Follow-up Appointments o Return Appointment in 1 week. o Nurse Visit as needed Edema Control o Teacher, early years/preUnna Boots Bilaterally o Elevate legs to the level of the heart and pump ankles as often as possible Notes complete current antibiotics, order compressing stockings from [facility] to have for  next visit. Check on status of lymphadema pump order Electronic Signature(s) Signed: 10/04/2019 4:32:44 PM By: Tyler AasButler, Michelle Signed: 10/04/2019 4:45:03 PM By: Lenda KelpStone Lin, Bow Buntyn PA-C Entered By: Tyler AasButler, Michelle on 10/04/2019 09:07:42 Jeffrey GillisSTEWART, Jeffrey G. ([id number]) -------------------------------------------------------------------------------- Problem List Details Patient Name: Jeffrey GillisSTEWART, Clemon G. Date of Service: 10/04/2019 8:15 AM Medical Record Number: [id number] Patient Account Number: [id number] Date of Birth/Sex: 05/04/1975 (44 y.o. M) Treating RN: Primary Care Provider: Kerby NoraBedsole, Amy Other Clinician: Referring Provider: Kerby NoraBedsole, Amy Treating Provider/Extender: Linwood DibblesSTONE Lin, Keo Schirmer Weeks in Treatment: 2 Active Problems ICD-10 Encounter Code Description Active Date MDM Diagnosis I89.0 Lymphedema, not elsewhere classified 09/20/2019 No Yes L97.821 Non-pressure chronic ulcer of other part of left lower leg limited to 09/20/2019 No Yes breakdown of skin L97.811 Non-pressure chronic ulcer of other part of right lower leg limited to 09/20/2019 No Yes breakdown of skin I10 Essential (primary) hypertension 09/20/2019 No Yes Inactive Problems Resolved Problems Electronic Signature(s) Signed: 10/04/2019 8:29:58 AM By: Lenda KelpStone Lin, Yiselle Babich PA-C Entered By: Lenda KelpStone Lin, Gustava Berland on 10/04/2019 08:29:57 Jeffrey GillisSTEWART, Jeffrey G. ([id number]) -------------------------------------------------------------------------------- Progress Note Details Patient Name: Jeffrey GillisSTEWART, Jeffrey G. Date of Service: 10/04/2019 8:15 AM Medical Record Number: [id number] Patient Account Number: [id number] Date of Birth/Sex: 03/25/1975 (44 y.o. M) Treating RN: Tyler AasButler, Michelle Primary Care Provider: Kerby NoraBedsole, Amy Other Clinician: Referring Provider: Kerby NoraBedsole, Amy Treating Provider/Extender: Linwood DibblesSTONE Lin, Meagan Ancona Weeks in Treatment: 2 Subjective Chief Complaint Information obtained from Patient Bilateral LE Lymphedema History of Present Illness  (HPI) 09/20/2019 upon evaluation today patient presents for initial evaluation in our clinic concerning issues that he has been having with wounds over the bilateral lower extremities. Both legs seem to be fairly equally affected at this point based on what I am seeing. With that being said the patient also appears to be having some drainage which is somewhat blue/green in nature and does have me concerned about the possibility of infection. I explained this often can indicate a Pseudomonas infection despite the fact that the patient has been on antibiotics currently this could still be problematic. He currently  does not have home health that is something that we did discussed the potential for looking into. I think that could be beneficial for him to be honest. The patient seems somewhat frustrated with the situation in general. He does have a history of lymphedema, hypertension, and he does ambulate and walk though not a lot and tells me that he really does not drive unless is absolutely forced for something he absolutely has to do otherwise he is essentially homebound at this point. He did have a believe his mother with him today. 09/28/2019 on evaluation today patient appears to be doing better in regard to his leg ulcers at this point. The antibiotic does seem to be helping and overall he tells me that his pain is less though he still is having discomfort. Fortunately there is no signs of active infection at this time. 10/04/2019 upon evaluation today patient appears to be doing well at this point in regard to his wounds. He in fact is improving dramatically from the standpoint of his infection which I think is under great control. I see no evidence of infection systemically at this point and overall I am extremely pleased I think he is getting close to resolution to be honest though he still has several scattered areas at this point. Objective Constitutional Obese and well-hydrated in no acute  distress. Vitals Time Taken: 8:17 AM, Height: 77 in, Weight: 265 lbs, BMI: 31.4, Temperature: 98.2 F, Pulse: 109 bpm, Respiratory Rate: 16 breaths/min, Blood Pressure: 128/98 mmHg. Respiratory normal breathing without difficulty. Psychiatric this patient is able to make decisions and demonstrates good insight into disease process. Alert and Oriented x 3. pleasant and cooperative. General Notes: His wound bed actually showed signs of good granulation at this time at all wound locations medially actually healed but had some dry drainage crusted over the surface of where this had healed. Overall I am extremely pleased with where things stand and the patient is happy to hear this. Integumentary (Hair, Skin) Wound #1 status is Open. Original cause of wound was Gradually Appeared. The wound is located on the Left,Circumferential Lower Leg. The wound measures 28cm length x 43cm width x 0.1cm depth; 945.619cm^2 area and 94.562cm^3 volume. There is Fat Layer (Subcutaneous Tissue) exposed. There is no tunneling or undermining noted. There is a large amount of purulent drainage noted. The wound margin is distinct with the outline attached to the wound base. There is no granulation within the wound bed. There is a large (67-100%) amount of necrotic tissue within the wound bed including Adherent Slough. Wound #2 status is Open. Original cause of wound was Gradually Appeared. The wound is located on the Right,Circumferential Lower Leg. The wound measures 28cm length x 43cm width x 0.1cm depth; 945.619cm^2 area and 94.562cm^3 volume. There is Fat Layer (Subcutaneous Tissue) exposed. There is no tunneling or undermining noted. There is a large amount of purulent drainage noted. Foul odor after cleansing was noted. The wound margin is indistinct and nonvisible. There is no granulation within the wound bed. There is a large (67-100%) amount of necrotic tissue within the wound bed including Adherent  Slough. 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 There  was a Radio broadcast assistant Compression Therapy Procedure by Tyler Aas, RN. Post procedure Diagnosis Wound #1: Same as Pre-Procedure Wound #2 Pre-procedure diagnosis of Wound #2 is a Lymphedema located on the Right,Circumferential Lower Leg . There was a Radio broadcast assistant Compression Therapy Procedure by Tyler Aas, RN. Post procedure Diagnosis Wound #2: Same as Pre-Procedure Plan Wound Cleansing: Clean wound with Normal Saline. Primary Wound Dressing: Silver Alginate Secondary Dressing: Wound #1 Left,Circumferential Lower Leg: XtraSorb Wound #2 Right,Circumferential Lower Leg: XtraSorb Dressing Change Frequency: Change dressing every week Follow-up Appointments: Return Appointment in 1 week. Nurse Visit as needed Edema Control: Warehouse manager Bilaterally Elevate legs to the level of the heart and pump ankles as often as possible General Notes: complete current antibiotics, order compressing stockings from [facility] to have for next visit. Check on status of lymphadema pump order 1. I would recommend that we go ahead and initiate a continuation of the wound care measures as before. This includes the silver alginate dressing to the open wound locations followed by XtraSorb although we may not he may need XtraSorb by next week to be honest. 2. I am also can recommend we continue with Unna boots bilaterally. 3. I would also suggest that as of this patient gets his lymphedema pumps he can start using those without complication I do believe they  will help him in my opinion. 4. We did given notes and measurements for his compression stockings he should order from [facility] hopefully he can have this for the next visit in case he needs some that we probably will be wrapping for at least 1 more week following. We will see patient back for reevaluation in 1 week here in the clinic. If anything worsens or changes patient will contact our office for additional recommendations. Jeffrey Lin, CUPPLES ([id number]) Electronic Signature(s) Signed: 10/04/2019 9:25:22 AM By: Lenda Kelp PA-C Entered By: Lenda Kelp on 10/04/2019 09:25:22 SORIN, FRIMPONG ([id number]) -------------------------------------------------------------------------------- SuperBill Details Patient Name: Jeffrey Lin Date of Service: 10/04/2019 Medical Record Number: [id number] Patient Account Number: [id number] Date of Birth/Sex: [birth year] (44 y.o. M) Treating RN: Tyler Aas Primary Care Provider: Kerby Nora Other Clinician: Referring Provider: Kerby Nora Treating Provider/Extender: Linwood Dibbles, Dom Haverland Weeks in Treatment: 2 Diagnosis Coding ICD-10 Codes Code Description I89.0 Lymphedema, not elsewhere classified L97.821 Non-pressure chronic ulcer of other part of left lower leg limited to breakdown of skin L97.811 Non-pressure chronic ulcer of other part of right lower leg limited to breakdown of skin I10 Essential (primary) hypertension Facility Procedures CPT4: Description Modifier Quantity Code [id number] BILATERAL: Application of multi-layer venous compression system; leg (below knee), including 1 ankle and foot. Physician Procedures CPT4 Code: [id number] Description: 99213 - WC PHYS LEVEL 3 - EST PT Modifier: Quantity: 1 CPT4 Code: Description: ICD-10 Diagnosis Description I89.0 Lymphedema, not elsewhere classified L97.821 Non-pressure chronic ulcer of other part of left lower leg limited to brea L97.811 Non-pressure chronic ulcer of  other part of right lower leg limited to bre I10  Essential (primary) hypertension Modifier: kdown of skin akdown of skin Quantity: Electronic Signature(s) Signed: 10/04/2019 9:26:24 AM By: Lenda Kelp PA-C Entered By: Lenda Kelp on 10/04/2019 92:42:68

## 2019-10-04 NOTE — Progress Notes (Signed)
PAXON, PROPES ([id number]) Visit Report for 10/04/2019 Arrival Information Details Patient Name: Jeffrey Lin, Jeffrey Lin Date of Service: 10/04/2019 8:15 AM Medical Record Number: [id number] Patient Account Number: [id number] Date of Birth/Sex: 1975/12/01 (44 y.o. M) Treating RN: Tyler Aas Primary Care Jachob Mcclean: Kerby Nora Other Clinician: Referring Corin Formisano: Kerby Nora Treating Ardith Test/Extender: Linwood Dibbles, HOYT Weeks in Treatment: 2 Visit Information History Since Last Visit Added or deleted any medications: No Patient Arrived: Ambulatory Had a fall or experienced change in No Arrival Time: 08:17 activities of daily living that may affect Accompanied By: self risk of falls: Transfer Assistance: None Hospitalized since last visit: No Patient Identification Verified: Yes Pain Present Now: No Patient Requires Transmission-Based Precautions: No Patient Has Alerts: No Electronic Signature(s) Signed: 10/04/2019 4:32:44 PM By: Tyler Aas Entered By: Tyler Aas on 10/04/2019 08:17:54 Jeffrey Lin ([id number]) -------------------------------------------------------------------------------- Compression Therapy Details Patient Name: Jeffrey Lin Date of Service: 10/04/2019 8:15 AM Medical Record Number: [id number] Patient Account Number: [id number] Date of Birth/Sex: [birth year] (44 y.o. M) Treating RN: Tyler Aas Primary Care Donald Jacque: Kerby Nora Other Clinician: Referring Samantha Olivera: Kerby Nora Treating Audley Hinojos/Extender: Linwood Dibbles, HOYT Weeks in Treatment: 2 Compression Therapy Performed for Wound Assessment: Wound #1 Left,Circumferential Lower Leg Performed By: Clinician Tyler Aas, RN Compression Type: Henriette Combs Post Procedure Diagnosis Same as Pre-procedure Electronic Signature(s) Signed: 10/04/2019 4:32:44 PM By: Tyler Aas Entered By: Tyler Aas on 10/04/2019 09:00:03 Jeffrey Lin  ([id number]) -------------------------------------------------------------------------------- Compression Therapy Details Patient Name: Jeffrey Lin Date of Service: 10/04/2019 8:15 AM Medical Record Number: [id number] Patient Account Number: [id number] Date of Birth/Sex: [birth year] (44 y.o. M) Treating RN: Tyler Aas Primary Care Tomasita Beevers: Kerby Nora Other Clinician: Referring Shan Padgett: Kerby Nora Treating Nikodem Leadbetter/Extender: Linwood Dibbles, HOYT Weeks in Treatment: 2 Compression Therapy Performed for Wound Assessment: Wound #2 Right,Circumferential Lower Leg Performed By: Clinician Tyler Aas, RN Compression Type: Henriette Combs Post Procedure Diagnosis Same as Pre-procedure Electronic Signature(s) Signed: 10/04/2019 4:32:44 PM By: Tyler Aas Entered By: Tyler Aas on 10/04/2019 09:00:03 Jeffrey Lin ([id number]) -------------------------------------------------------------------------------- Encounter Discharge Information Details Patient Name: Jeffrey Lin Date of Service: 10/04/2019 8:15 AM Medical Record Number: [id number] Patient Account Number: [id number] Date of Birth/Sex: March 31, 1975 (44 y.o. M) Treating RN: Tyler Aas Primary Care Daionna Crossland: Kerby Nora Other Clinician: Referring Andreea Arca: Kerby Nora Treating Inas Avena/Extender: Lenda Kelp Weeks in Treatment: 2 Encounter Discharge Information Items Discharge Condition: Stable Ambulatory Status: Ambulatory Discharge Destination: Home Transportation: Private Auto Accompanied By: self Schedule Follow-up Appointment: Yes Clinical Summary of Care: Electronic Signature(s) Signed: 10/04/2019 4:32:44 PM By: Tyler Aas Entered By: Tyler Aas on 10/04/2019 09:05:25 Jeffrey Lin ([id number]) -------------------------------------------------------------------------------- Lower Extremity Assessment Details Patient Name: Jeffrey Lin Date of Service: 10/04/2019 8:15  AM Medical Record Number: [id number] Patient Account Number: [id number] Date of Birth/Sex: [birth year] (44 y.o. M) Treating RN: Tyler Aas Primary Care Legend Pecore: Kerby Nora Other Clinician: Referring Shravya Wickwire: Kerby Nora Treating Liem Copenhaver/Extender: Linwood Dibbles, HOYT Weeks in Treatment: 2 Edema Assessment Assessed: [Left: No] [Right: No] Edema: [Left: Yes] [Right: Yes] Calf Left: Right: Point of Measurement: 35 cm From Medial Instep 48 cm 43.5 cm Ankle Left: Right: Point of Measurement: 10 cm From Medial Instep 35.5 cm 33.5 cm Vascular Assessment Pulses: Dorsalis Pedis Palpable: [Left:No] [Right:No] Posterior Tibial Doppler Audible: [Left:Yes] [Right:Yes] Electronic Signature(s) Signed: 10/04/2019 2:11:58 PM By: Benna Dunks Signed: 10/04/2019 4:32:44 PM By: Tyler Aas Entered By: Benna Dunks on 10/04/2019 08:45:26 Mcilrath, Deeann Saint ([id number]) -------------------------------------------------------------------------------- Multi Wound Chart Details Patient Name: Jeffrey Lin Date  of Service: 10/04/2019 8:15 AM Medical Record Number: [id number] Patient Account Number: [id number] Date of Birth/Sex: 16-Oct-1975 (44 y.o. M) Treating RN: Tyler Aas Primary Care Andrea Colglazier: Kerby Nora Other Clinician: Referring Helina Hullum: Kerby Nora Treating Elanda Garmany/Extender: Linwood Dibbles, HOYT Weeks in Treatment: 2 Vital Signs Height(in): 77 Pulse(bpm): 109 Weight(lbs): 265 Blood Pressure(mmHg): 128/98 Body Mass Index(BMI): 31 Temperature(F): 98.2 Respiratory Rate(breaths/min): 16 Photos: [N/A:N/A] Wound Location: Left, Circumferential Lower Leg Right, Circumferential Lower Leg N/A Wounding Event: Gradually Appeared Gradually Appeared N/A Primary Etiology: Lymphedema Lymphedema N/A Comorbid History: Hypertension Hypertension N/A Date Acquired: 04/22/2019 04/22/2019 N/A Weeks of Treatment: 1 1 N/A Wound Status: Open Open N/A Measurements L x W x D (cm) 28x43x0.1  28x43x0.1 N/A Area (cm) : 945.619 945.619 N/A Volume (cm) : 94.562 94.562 N/A % Reduction in Area: 0.00% 0.00% N/A % Reduction in Volume: 0.00% 0.00% N/A Classification: Partial Thickness Full Thickness Without Exposed N/A Support Structures Exudate Amount: Large Large N/A Exudate Type: Purulent Purulent N/A Exudate Color: yellow, brown, green yellow, brown, green N/A Foul Odor After Cleansing: No Yes N/A Odor Anticipated Due to Product N/A No N/A Use: Wound Margin: Distinct, outline attached Indistinct, nonvisible N/A Granulation Amount: None Present (0%) None Present (0%) N/A Necrotic Amount: Large (67-100%) Large (67-100%) N/A Exposed Structures: Fat Layer (Subcutaneous Tissue): Fat Layer (Subcutaneous Tissue): N/A Yes Yes Fascia: No Fascia: No Tendon: No Tendon: No Muscle: No Muscle: No Joint: No Joint: No Bone: No Bone: No Epithelialization: Small (1-33%) None N/A Treatment Notes Electronic Signature(s) Signed: 10/04/2019 4:32:44 PM By: Tyler Aas Entered By: Tyler Aas on 10/04/2019 08:58:01 Jeffrey Lin ([id number]) -------------------------------------------------------------------------------- Multi-Disciplinary Care Plan Details Patient Name: Jeffrey Lin Date of Service: 10/04/2019 8:15 AM Medical Record Number: [id number] Patient Account Number: [id number] Date of Birth/Sex: 04-04-1975 (44 y.o. M) Treating RN: Tyler Aas Primary Care Keniya Schlotterbeck: Kerby Nora Other Clinician: Referring Alizee Maple: Kerby Nora Treating Meshia Rau/Extender: Linwood Dibbles, HOYT Weeks in Treatment: 2 Active Inactive Orientation to the Wound Care Program Nursing Diagnoses: Knowledge deficit related to the wound healing center program Goals: Patient/caregiver will verbalize understanding of the Wound Healing Center Program Date Initiated: 09/20/2019 Target Resolution Date: 09/24/2019 Goal Status: Active Interventions: Provide education on orientation to the  wound center Notes: Wound/Skin Impairment Nursing Diagnoses: Impaired tissue integrity Knowledge deficit related to ulceration/compromised skin integrity Goals: Patient/caregiver will verbalize understanding of skin care regimen Date Initiated: 09/20/2019 Target Resolution Date: 10/01/2019 Goal Status: Active Interventions: Assess patient/caregiver ability to obtain necessary supplies Assess patient/caregiver ability to perform ulcer/skin care regimen upon admission and as needed Assess ulceration(s) every visit Provide education on ulcer and skin care Treatment Activities: Skin care regimen initiated : 09/20/2019 Topical wound management initiated : 09/20/2019 Notes: Electronic Signature(s) Signed: 10/04/2019 4:32:44 PM By: Tyler Aas Entered By: Tyler Aas on 10/04/2019 08:58:24 Jeffrey Lin ([id number]) -------------------------------------------------------------------------------- Pain Assessment Details Patient Name: Jeffrey Lin Date of Service: 10/04/2019 8:15 AM Medical Record Number: [id number] Patient Account Number: [id number] Date of Birth/Sex: May 28, 1975 (45 y.o. M) Treating RN: Tyler Aas Primary Care Clemente Dewey: Kerby Nora Other Clinician: Referring Candise Crabtree: Kerby Nora Treating Quan Cybulski/Extender: Linwood Dibbles, HOYT Weeks in Treatment: 2 Active Problems Location of Pain Severity and Description of Pain Patient Has Paino No Site Locations Pain Management and Medication Current Pain Management: Electronic Signature(s) Signed: 10/04/2019 4:32:44 PM By: Tyler Aas Entered By: Tyler Aas on 10/04/2019 08:18:26 Jeffrey Lin ([id number]) -------------------------------------------------------------------------------- Patient/Caregiver Education Details Patient Name: Jeffrey Lin Date of Service: 10/04/2019 8:15 AM Medical Record Number: [id number] Patient Account  Number: [id number] Date of Birth/Gender: 1975/05/06 (44 y.o.  M) Treating RN: Tyler Aas Primary Care Physician: Kerby Nora Other Clinician: Referring Physician: Kerby Nora Treating Physician/Extender: Skeet Simmer in Treatment: 2 Education Assessment Education Provided To: Patient Education Topics Provided Wound/Skin Impairment: Handouts: Caring for Your Ulcer Methods: Explain/Verbal Responses: State content correctly Electronic Signature(s) Signed: 10/04/2019 4:32:44 PM By: Tyler Aas Entered By: Tyler Aas on 10/04/2019 08:58:15 Jeffrey Lin ([id number]) -------------------------------------------------------------------------------- Wound Assessment Details Patient Name: Jeffrey Lin Date of Service: 10/04/2019 8:15 AM Medical Record Number: [id number] Patient Account Number: [id number] Date of Birth/Sex: 05-20-1975 (44 y.o. M) Treating RN: Tyler Aas Primary Care Kesean Serviss: Kerby Nora Other Clinician: Referring Devlynn Knoff: Kerby Nora Treating Nakeia Calvi/Extender: Linwood Dibbles, HOYT Weeks in Treatment: 2 Wound Status Wound Number: 1 Primary Etiology: Lymphedema Wound Location: Left, Circumferential Lower Leg Wound Status: Open Wounding Event: Gradually Appeared Comorbid History: Hypertension Date Acquired: 04/22/2019 Weeks Of Treatment: 1 Clustered Wound: No Photos Wound Measurements Length: (cm) 28 Width: (cm) 43 Depth: (cm) 0.1 Area: (cm) 945.619 Volume: (cm) 94.562 % Reduction in Area: 0% % Reduction in Volume: 0% Epithelialization: Small (1-33%) Tunneling: No Undermining: No Wound Description Classification: Partial Thickness Fou Wound Margin: Distinct, outline attached Slo Exudate Amount: Large Exudate Type: Purulent Exudate Color: yellow, brown, green l Odor After Cleansing: No ugh/Fibrino Yes Wound Bed Granulation Amount: None Present (0%) Exposed Structure Necrotic Amount: Large (67-100%) Fascia Exposed: No Necrotic Quality: Adherent Slough Fat Layer (Subcutaneous  Tissue) Exposed: Yes Tendon Exposed: No Muscle Exposed: No Joint Exposed: No Bone Exposed: No Treatment Notes Wound #1 (Left, Circumferential Lower Leg) Notes scell, xsorb, Unna Bilateral Electronic Signature(s) Signed: 10/04/2019 4:32:44 PM By: Joline Maxcy, Deeann Saint ([id number]) Entered By: Tyler Aas on 10/04/2019 08:37:08 Jeffrey Lin ([id number]) -------------------------------------------------------------------------------- Wound Assessment Details Patient Name: Jeffrey Lin Date of Service: 10/04/2019 8:15 AM Medical Record Number: [id number] Patient Account Number: [id number] Date of Birth/Sex: 25-Oct-1975 (44 y.o. M) Treating RN: Tyler Aas Primary Care Benjimen Kelley: Kerby Nora Other Clinician: Referring Colm Lyford: Kerby Nora Treating Lynetta Tomczak/Extender: Linwood Dibbles, HOYT Weeks in Treatment: 2 Wound Status Wound Number: 2 Primary Etiology: Lymphedema Wound Location: Right, Circumferential Lower Leg Wound Status: Open Wounding Event: Gradually Appeared Comorbid History: Hypertension Date Acquired: 04/22/2019 Weeks Of Treatment: 1 Clustered Wound: No Photos Wound Measurements Length: (cm) 28 Width: (cm) 43 Depth: (cm) 0.1 Area: (cm) 945.619 Volume: (cm) 94.562 % Reduction in Area: 0% % Reduction in Volume: 0% Epithelialization: None Tunneling: No Undermining: No Wound Description Classification: Full Thickness Without Exposed Support Struct Wound Margin: Indistinct, nonvisible Exudate Amount: Large Exudate Type: Purulent Exudate Color: yellow, brown, green ures Foul Odor After Cleansing: Yes Due to Product Use: No Slough/Fibrino Yes Wound Bed Granulation Amount: None Present (0%) Exposed Structure Necrotic Amount: Large (67-100%) Fascia Exposed: No Necrotic Quality: Adherent Slough Fat Layer (Subcutaneous Tissue) Exposed: Yes Tendon Exposed: No Muscle Exposed: No Joint Exposed: No Bone Exposed: No Treatment Notes Wound  #2 (Right, Circumferential Lower Leg) Notes scell, xsorb, Unna Bilateral Electronic Signature(s) Signed: 10/04/2019 4:32:44 PM By: Joline Maxcy, Deeann Saint ([id number]) Entered By: Tyler Aas on 10/04/2019 08:37:45 Jeffrey Lin ([id number]) -------------------------------------------------------------------------------- Vitals Details Patient Name: Jeffrey Lin Date of Service: 10/04/2019 8:15 AM Medical Record Number: [id number] Patient Account Number: [id number] Date of Birth/Sex: 1975/06/17 (44 y.o. M) Treating RN: Tyler Aas Primary Care Zale Marcotte: Kerby Nora Other Clinician: Referring Alvia Jablonski: Kerby Nora Treating Holt Woolbright/Extender: Linwood Dibbles, HOYT Weeks in Treatment: 2 Vital Signs Time Taken: 08:17 Temperature (F):  98.2 Height (in): 77 Pulse (bpm): 109 Weight (lbs): 265 Respiratory Rate (breaths/min): 16 Body Mass Index (BMI): 31.4 Blood Pressure (mmHg): 128/98 Reference Range: 80 - 120 mg / dl Electronic Signature(s) Signed: 10/04/2019 4:32:44 PM By: Tyler AasButler, Michelle Entered By: Tyler AasButler, Michelle on 10/04/2019 08:18:19

## 2019-10-04 NOTE — Assessment & Plan Note (Signed)
Inadequate control on statin. Make sure taking atorvastatin regularly. Keep working on lowering cholesterol and sugar in diet.  Increase walking as much as able.

## 2019-10-06 ENCOUNTER — Encounter: Payer: Self-pay | Admitting: Family

## 2019-10-06 ENCOUNTER — Telehealth: Payer: Self-pay | Admitting: Family

## 2019-10-06 NOTE — Telephone Encounter (Signed)
Contact Natalia Leatherwood [phone]) regarding Mr. Vikrant Helvie's lymphedema pumps. They have everything from our office. Filed with insurance and it was denied. It is in process of being appealed.   However, appeal requires 'Appointment of representation' which is a form Mr. Sleight must sign and they have been unable to reach him.   Mr. Gayton needs to contact 'Theodoro Grist' at [phone]. Will send MyChart message to make him aware of update.   On review, Mr. Eisenhardt has upcoming appointment with wound care clinic. Will reach out to that provider to see if Mr. Gell can sign the form while in office. Mr. Ralph Leyden was agreeable to fax the form to our office. I will reach out to Mr. Borba as well as Allen Derry, PA (wound care clinic).   Tells me the appeal often takes 2+ weeks to receive answer.   Alver Sorrow, NP

## 2019-10-12 ENCOUNTER — Other Ambulatory Visit: Payer: Self-pay

## 2019-10-12 ENCOUNTER — Encounter: Payer: PPO | Admitting: Physician Assistant

## 2019-10-12 DIAGNOSIS — L97821 Non-pressure chronic ulcer of other part of left lower leg limited to breakdown of skin: Secondary | ICD-10-CM | POA: Diagnosis not present

## 2019-10-12 DIAGNOSIS — I890 Lymphedema, not elsewhere classified: Secondary | ICD-10-CM | POA: Diagnosis not present

## 2019-10-12 DIAGNOSIS — L97822 Non-pressure chronic ulcer of other part of left lower leg with fat layer exposed: Secondary | ICD-10-CM | POA: Diagnosis not present

## 2019-10-12 NOTE — Progress Notes (Addendum)
Jeffrey Lin, Jeffrey Lin ([id number]) Visit Report for 10/12/2019 Chief Complaint Document Details Patient Name: Jeffrey Lin Date of Service: 10/12/2019 8:00 AM Medical Record Number: [id number] Patient Account Number: [id number] Date of Birth/Sex: July 09, 1975 (44 y.o. M) Treating RN: Tyler Aas Primary Care Provider: Kerby Nora Other Clinician: Referring Provider: Kerby Nora Treating Provider/Extender: Linwood Dibbles, Jermiyah Ricotta Weeks in Treatment: 3 Information Obtained from: Patient Chief Complaint Bilateral LE Lymphedema Electronic Signature(s) Signed: 10/12/2019 8:26:51 AM By: Lenda Kelp PA-C Entered By: Lenda Kelp on 10/12/2019 08:26:51 Jeffrey Lin, Jeffrey Lin ([id number]) -------------------------------------------------------------------------------- HPI Details Patient Name: Jeffrey Lin Date of Service: 10/12/2019 8:00 AM Medical Record Number: [id number] Patient Account Number: [id number] Date of Birth/Sex: 12/20/1975 (44 y.o. M) Treating RN: Tyler Aas Primary Care Provider: Kerby Nora Other Clinician: Referring Provider: Kerby Nora Treating Provider/Extender: Linwood Dibbles, Vedh Ptacek Weeks in Treatment: 3 History of Present Illness HPI Description: 09/20/2019 upon evaluation today patient presents for initial evaluation in our clinic concerning issues that he has been having with wounds over the bilateral lower extremities. Both legs seem to be fairly equally affected at this point based on what I am seeing. With that being said the patient also appears to be having some drainage which is somewhat blue/green in nature and does have me concerned about the possibility of infection. I explained this often can indicate a Pseudomonas infection despite the fact that the patient has been on antibiotics currently this could still be problematic. He currently does not have home health that is something that we did discussed the potential for looking into. I think that could be  beneficial for him to be honest. The patient seems somewhat frustrated with the situation in general. He does have a history of lymphedema, hypertension, and he does ambulate and walk though not a lot and tells me that he really does not drive unless is absolutely forced for something he absolutely has to do otherwise he is essentially homebound at this point. He did have a believe his mother with him today. 09/28/2019 on evaluation today patient appears to be doing better in regard to his leg ulcers at this point. The antibiotic does seem to be helping and overall he tells me that his pain is less though he still is having discomfort. Fortunately there is no signs of active infection at this time. 10/04/2019 upon evaluation today patient appears to be doing well at this point in regard to his wounds. He in fact is improving dramatically from the standpoint of his infection which I think is under great control. I see no evidence of infection systemically at this point and overall I am extremely pleased I think he is getting close to resolution to be honest though he still has several scattered areas at this point. 10/12/2019 upon evaluation today patient actually seems to be making excellent progress in regard to his leg ulcers at this point. He just has a small area on the left posterior and a small area on the right anterior leg that are still open. Everything else has completely resolved and seems to have done excellent up to this point. Electronic Signature(s) Signed: 10/12/2019 10:22:12 AM By: Lenda Kelp PA-C Entered By: Lenda Kelp on 10/12/2019 10:22:12 Jeffrey Lin, Jeffrey Lin ([id number]) -------------------------------------------------------------------------------- Physical Exam Details Patient Name: Jeffrey Lin Date of Service: 10/12/2019 8:00 AM Medical Record Number: [id number] Patient Account Number: [id number] Date of Birth/Sex: 13-Nov-1975 (44 y.o. M) Treating RN: Tyler Aas Primary Care Provider: Kerby Nora Other Clinician: Referring Provider:  Bedsole, Amy Treating Provider/Extender: STONE III, Ayasha Ellingsen Weeks in Treatment: 3 Constitutional Obese and well-hydrated in no acute distress. Respiratory normal breathing without difficulty. Psychiatric this patient is able to make decisions and demonstrates good insight into disease process. Alert and Oriented x 3. pleasant and cooperative. Notes Upon inspection patient's wound bed actually showed signs of good granulation at this point and overall I'm extremely pleased with how he is progressing how things are doing I think that his infection has cleared based on what I'm seeing in right now were just trying to manage his last couple remaining wounds at this time followed by hopefully initiation of his compression stockings following that he will start using once he heals. Electronic Signature(s) Signed: 10/12/2019 10:24:16 AM By: Lenda Kelp PA-C Entered By: Lenda Kelp on 10/12/2019 10:24:15 Jeffrey Lin, Jeffrey Lin ([id number]) -------------------------------------------------------------------------------- Physician Orders Details Patient Name: Jeffrey Lin Date of Service: 10/12/2019 8:00 AM Medical Record Number: [id number] Patient Account Number: [id number] Date of Birth/Sex: 05/30/1975 (44 y.o. M) Treating RN: Tyler Aas Primary Care Provider: Kerby Nora Other Clinician: Referring Provider: Kerby Nora Treating Provider/Extender: Linwood Dibbles, Aldon Hengst Weeks in Treatment: 3 Verbal / Phone Orders: No Diagnosis Coding ICD-10 Coding Code Description I89.0 Lymphedema, not elsewhere classified L97.821 Non-pressure chronic ulcer of other part of left lower leg limited to breakdown of skin L97.811 Non-pressure chronic ulcer of other part of right lower leg limited to breakdown of skin I10 Essential (primary) hypertension Wound Cleansing Wound #2 Right,Circumferential Lower Leg o Clean wound  with Normal Saline. Primary Wound Dressing Wound #1 Left,Circumferential Lower Leg o Silver Alginate Wound #2 Right,Circumferential Lower Leg o Silver Alginate Secondary Dressing Wound #1 Left,Circumferential Lower Leg o XtraSorb Wound #2 Right,Circumferential Lower Leg o XtraSorb o XtraSorb Dressing Change Frequency o Change dressing every week Follow-up Appointments o Return Appointment in 1 week. o Nurse Visit as needed Edema Control o Unna Boots Bilaterally o Elevate legs to the level of the heart and pump ankles as often as possible Electronic Signature(s) Signed: 10/12/2019 5:16:37 PM By: Tyler Aas Signed: 10/12/2019 5:24:34 PM By: Lenda Kelp PA-C Entered By: Tyler Aas on 10/12/2019 08:50:41 Jeffrey Lin, Jeffrey Lin ([id number]) -------------------------------------------------------------------------------- Problem List Details Patient Name: Jeffrey Lin Date of Service: 10/12/2019 8:00 AM Medical Record Number: [id number] Patient Account Number: [id number] Date of Birth/Sex: Jul 05, 1975 (44 y.o. M) Treating RN: Tyler Aas Primary Care Provider: Kerby Nora Other Clinician: Referring Provider: Kerby Nora Treating Provider/Extender: Linwood Dibbles, Lionel Woodberry Weeks in Treatment: 3 Active Problems ICD-10 Encounter Code Description Active Date MDM Diagnosis I89.0 Lymphedema, not elsewhere classified 09/20/2019 No Yes L97.821 Non-pressure chronic ulcer of other part of left lower leg limited to 09/20/2019 No Yes breakdown of skin L97.811 Non-pressure chronic ulcer of other part of right lower leg limited to 09/20/2019 No Yes breakdown of skin I10 Essential (primary) hypertension 09/20/2019 No Yes Inactive Problems Resolved Problems Electronic Signature(s) Signed: 10/12/2019 8:26:45 AM By: Lenda Kelp PA-C Entered By: Lenda Kelp on 10/12/2019 08:26:44 Jeffrey Lin  ([id number]) -------------------------------------------------------------------------------- Progress Note Details Patient Name: Jeffrey Lin Date of Service: 10/12/2019 8:00 AM Medical Record Number: [id number] Patient Account Number: [id number] Date of Birth/Sex: 1975-10-27 (44 y.o. M) Treating RN: Tyler Aas Primary Care Provider: Kerby Nora Other Clinician: Referring Provider: Kerby Nora Treating Provider/Extender: Linwood Dibbles, Crickett Abbett Weeks in Treatment: 3 Subjective Chief Complaint Information obtained from Patient Bilateral LE Lymphedema History of Present Illness (HPI) 09/20/2019 upon evaluation today patient presents for initial evaluation in our clinic concerning  issues that he has been having with wounds over the bilateral lower extremities. Both legs seem to be fairly equally affected at this point based on what I am seeing. With that being said the patient also appears to be having some drainage which is somewhat blue/green in nature and does have me concerned about the possibility of infection. I explained this often can indicate a Pseudomonas infection despite the fact that the patient has been on antibiotics currently this could still be problematic. He currently does not have home health that is something that we did discussed the potential for looking into. I think that could be beneficial for him to be honest. The patient seems somewhat frustrated with the situation in general. He does have a history of lymphedema, hypertension, and he does ambulate and walk though not a lot and tells me that he really does not drive unless is absolutely forced for something he absolutely has to do otherwise he is essentially homebound at this point. He did have a believe his mother with him today. 09/28/2019 on evaluation today patient appears to be doing better in regard to his leg ulcers at this point. The antibiotic does seem to be helping and overall he tells me that his pain is  less though he still is having discomfort. Fortunately there is no signs of active infection at this time. 10/04/2019 upon evaluation today patient appears to be doing well at this point in regard to his wounds. He in fact is improving dramatically from the standpoint of his infection which I think is under great control. I see no evidence of infection systemically at this point and overall I am extremely pleased I think he is getting close to resolution to be honest though he still has several scattered areas at this point. 10/12/2019 upon evaluation today patient actually seems to be making excellent progress in regard to his leg ulcers at this point. He just has a small area on the left posterior and a small area on the right anterior leg that are still open. Everything else has completely resolved and seems to have done excellent up to this point. Objective Constitutional Obese and well-hydrated in no acute distress. Vitals Time Taken: 8:08 AM, Height: 77 in, Weight: 265 lbs, BMI: 31.4, Temperature: 97.9 F, Pulse: 98 bpm, Respiratory Rate: 18 breaths/min, Blood Pressure: 156/106 mmHg. Respiratory normal breathing without difficulty. Psychiatric this patient is able to make decisions and demonstrates good insight into disease process. Alert and Oriented x 3. pleasant and cooperative. General Notes: Upon inspection patient's wound bed actually showed signs of good granulation at this point and overall I'm extremely pleased with how he is progressing how things are doing I think that his infection has cleared based on what I'm seeing in right now were just trying to manage his last couple remaining wounds at this time followed by hopefully initiation of his compression stockings following that he will start using once he heals. Integumentary (Hair, Skin) Wound #1 status is Open. Original cause of wound was Gradually Appeared. The wound is located on the Left,Circumferential Lower Leg.  The wound measures 2.5cm length x 2cm width x 0.1cm depth; 3.927cm^2 area and 0.393cm^3 volume. There is Fat Layer (Subcutaneous Tissue) exposed. There is a large amount of purulent drainage noted. The wound margin is distinct with the outline attached to the wound base. There is no granulation within the wound bed. There is a large (67-100%) amount of necrotic tissue within the wound bed including Adherent Slough.  Wound #2 status is Open. Original cause of wound was Gradually Appeared. The wound is located on the Right,Circumferential Lower Leg. The wound measures 0.2cm length x 0.3cm width x 0.1cm depth; 0.047cm^2 area and 0.005cm^3 volume. There is Fat Layer (Subcutaneous Tissue) exposed. There is a large amount of purulent drainage noted. Foul odor after cleansing was noted. The wound margin is indistinct and nonvisible. Jeffrey Lin, Jeffrey Lin ([id number]) There is no granulation within the wound bed. There is a large (67-100%) amount of necrotic tissue within the wound bed including Adherent Slough. 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 There was a Radio broadcast assistant Compression Therapy Procedure by Tyler Aas, RN. Post procedure Diagnosis Wound #1: Same as Pre-Procedure Wound #2 Pre-procedure diagnosis of Wound #2 is a Lymphedema located on the Right,Circumferential Lower Leg . There was a Radio broadcast assistant Compression Therapy Procedure by Tyler Aas, RN. Post procedure Diagnosis Wound #2: Same as Pre-Procedure Plan Wound Cleansing: Wound #2 Right,Circumferential Lower Leg: Clean wound with Normal Saline. Primary Wound Dressing: Wound  #1 Left,Circumferential Lower Leg: Silver Alginate Wound #2 Right,Circumferential Lower Leg: Silver Alginate Secondary Dressing: Wound #1 Left,Circumferential Lower Leg: XtraSorb Wound #2 Right,Circumferential Lower Leg: XtraSorb XtraSorb Dressing Change Frequency: Change dressing every week Follow-up Appointments: Return Appointment in 1 week. Nurse Visit as needed Edema Control: Unna Boots Bilaterally Elevate legs to the level of the heart and pump ankles as often as possible 1. I would recommend at this time that we continue with the wound care measures as before and the patient is in agreement with the plan. We will use a silver alginate dressing to the open wound areas on the bilateral lower extremities. 2. I'm also can recommend at this time that we continue with a small XtraSorb at each spot just in case he has drainage although I think that this is probably not necessary as of next week if he still has wounds continued open. 3. With regard to infection I feel like he has completely resolved that infection and overall I think he is fine not to have any additional antibiotics at this point. 4. We will continue with the bilateral Unna boot wraps. Jeffrey Lin, Jeffrey Lin ([id number]) We will see patient back for reevaluation in 1 week here in the clinic. If anything worsens or changes patient will contact our office for additional recommendations. Electronic Signature(s) Signed: 10/12/2019 10:25:05 AM By: Lenda Kelp PA-C Entered By: Lenda Kelp on 10/12/2019 10:25:04 Jeffrey Lin ([id number]) -------------------------------------------------------------------------------- SuperBill Details Patient Name: Jeffrey Lin Date of Service: 10/12/2019 Medical Record Number: [id number] Patient Account Number: [id number] Date of Birth/Sex: 1975/06/27 (44 y.o. M) Treating RN: Tyler Aas Primary Care Provider: Kerby Nora Other Clinician: Referring Provider: Kerby Nora Treating Provider/Extender: Linwood Dibbles, Henna Derderian Weeks in Treatment: 3 Diagnosis Coding ICD-10 Codes Code Description I89.0 Lymphedema, not elsewhere classified L97.821 Non-pressure chronic ulcer of other part of left lower leg limited to breakdown of skin L97.811 Non-pressure chronic ulcer of other part of right lower leg limited to breakdown of skin I10 Essential (primary) hypertension Facility Procedures CPT4: Description Modifier Quantity Code [id number] BILATERAL: Application of multi-layer venous compression system; leg (below knee), including 1 ankle and foot. Physician Procedures  CPT4 Code: [id number] Description: 99213 - WC PHYS LEVEL 3 - EST PT Modifier: Quantity: 1 CPT4 Code: Description: ICD-10 Diagnosis Description I89.0 Lymphedema, not elsewhere classified L97.821 Non-pressure chronic ulcer of other part of left lower leg limited to brea L97.811 Non-pressure chronic ulcer of other part of right lower leg limited to bre I10  Essential (primary) hypertension Modifier: kdown of skin akdown of skin Quantity: Electronic Signature(s) Signed: 10/12/2019 10:25:32 AM By: Lenda KelpStone III, Jeromey Kruer PA-C Entered By: Lenda KelpStone III, Vallery Mcdade on 10/12/2019 10:25:31

## 2019-10-13 NOTE — Progress Notes (Signed)
Jeffrey, Lin ([id number]) Visit Report for 10/12/2019 Arrival Information Details Patient Name: Jeffrey Lin, Jeffrey Lin Date of Service: 10/12/2019 8:00 AM Medical Record Number: [id number] Patient Account Number: [id number] Date of Birth/Sex: Dec 02, 1975 (44 y.o. M) Treating RN: Tyler Aas Primary Care Analyse Angst: Kerby Nora Other Clinician: Referring Jammi Morrissette: Kerby Nora Treating Avrielle Fry/Extender: Linwood Dibbles, HOYT Weeks in Treatment: 3 Visit Information History Since Last Visit Added or deleted any medications: No Patient Arrived: Ambulatory Any new allergies or adverse reactions: No Arrival Time: 08:09 Had a fall or experienced change in No Accompanied By: self activities of daily living that may affect Transfer Assistance: None risk of falls: Patient Identification Verified: Yes Signs or symptoms of abuse/neglect since last visito No Secondary Verification Process Completed: Yes Hospitalized since last visit: No Patient Requires Transmission-Based Precautions: No Implantable device outside of the clinic excluding No Patient Has Alerts: No cellular tissue based products placed in the center since last visit: Has Dressing in Place as Prescribed: Yes Has Compression in Place as Prescribed: Yes Pain Present Now: No Electronic Signature(s) Signed: 10/12/2019 4:27:05 PM By: Dayton Martes RCP, RRT, CHT Entered By: Dayton Martes on 10/12/2019 08:11:38 Jeffrey Lin ([id number]) -------------------------------------------------------------------------------- Compression Therapy Details Patient Name: Jeffrey Lin Date of Service: 10/12/2019 8:00 AM Medical Record Number: [id number] Patient Account Number: [id number] Date of Birth/Sex: February 02, 1975 (44 y.o. M) Treating RN: Tyler Aas Primary Care Gerri Acre: Kerby Nora Other Clinician: Referring Heide Brossart: Kerby Nora Treating Rain Wilhide/Extender: Linwood Dibbles, HOYT Weeks in Treatment:  3 Compression Therapy Performed for Wound Assessment: Wound #1 Left,Circumferential Lower Leg Performed By: Clinician Tyler Aas, RN Compression Type: Henriette Combs Post Procedure Diagnosis Same as Pre-procedure Electronic Signature(s) Signed: 10/12/2019 5:16:37 PM By: Tyler Aas Entered By: Tyler Aas on 10/12/2019 08:48:55 Pardini, Deeann Saint ([id number]) -------------------------------------------------------------------------------- Compression Therapy Details Patient Name: Jeffrey Lin Date of Service: 10/12/2019 8:00 AM Medical Record Number: [id number] Patient Account Number: [id number] Date of Birth/Sex: 1975/04/07 (44 y.o. M) Treating RN: Tyler Aas Primary Care Royale Swamy: Kerby Nora Other Clinician: Referring Derrel Moore: Kerby Nora Treating Shada Nienaber/Extender: Linwood Dibbles, HOYT Weeks in Treatment: 3 Compression Therapy Performed for Wound Assessment: Wound #2 Right,Circumferential Lower Leg Performed By: Clinician Tyler Aas, RN Compression Type: Henriette Combs Post Procedure Diagnosis Same as Pre-procedure Electronic Signature(s) Signed: 10/12/2019 5:16:37 PM By: Tyler Aas Entered By: Tyler Aas on 10/12/2019 08:48:55 BRANTON, EINSTEIN ([id number]) -------------------------------------------------------------------------------- Encounter Discharge Information Details Patient Name: Jeffrey Lin Date of Service: 10/12/2019 8:00 AM Medical Record Number: [id number] Patient Account Number: [id number] Date of Birth/Sex: 10-09-1975 (44 y.o. M) Treating RN: Tyler Aas Primary Care Nuel Dejaynes: Kerby Nora Other Clinician: Referring Naimah Yingst: Kerby Nora Treating Teagyn Fishel/Extender: Lenda Kelp Weeks in Treatment: 3 Encounter Discharge Information Items Discharge Condition: Stable Ambulatory Status: Ambulatory Discharge Destination: Home Transportation: Private Auto Accompanied By: self Schedule Follow-up Appointment: Yes Clinical  Summary of Care: Electronic Signature(s) Signed: 10/12/2019 5:16:37 PM By: Tyler Aas Entered By: Tyler Aas on 10/12/2019 08:51:34 Jeffrey Lin ([id number]) -------------------------------------------------------------------------------- Lower Extremity Assessment Details Patient Name: Jeffrey Lin Date of Service: 10/12/2019 8:00 AM Medical Record Number: [id number] Patient Account Number: [id number] Date of Birth/Sex: [birth year] (44 y.o. M) Treating RN: Tyler Aas Primary Care Shakemia Madera: Kerby Nora Other Clinician: Referring Marlane Hirschmann: Kerby Nora Treating Jilliam Bellmore/Extender: Linwood Dibbles, HOYT Weeks in Treatment: 3 Edema Assessment Assessed: [Left: Yes] [Right: Yes] Edema: [Left: Yes] [Right: Yes] Calf Left: Right: Point of Measurement: 35 cm From Medial Instep 47 cm 44 cm Ankle Left: Right: Point of Measurement: 10 cm  From Medial Instep 37 cm 33.5 cm Vascular Assessment Pulses: Dorsalis Pedis Palpable: [Left:No] [Right:No] Doppler Audible: [Left:Yes] [Right:Yes] Posterior Tibial Palpable: [Left:No Yes] [Right:No Yes] Electronic Signature(s) Signed: 10/12/2019 12:40:51 PM By: Benna Dunks Signed: 10/12/2019 5:16:37 PM By: Tyler Aas Entered By: Benna Dunks on 10/12/2019 08:37:43 Raupp, Deeann Saint ([id number]) -------------------------------------------------------------------------------- Multi Wound Chart Details Patient Name: Jeffrey Lin Date of Service: 10/12/2019 8:00 AM Medical Record Number: [id number] Patient Account Number: [id number] Date of Birth/Sex: September 22, 1975 (44 y.o. M) Treating RN: Tyler Aas Primary Care Shondra Capps: Kerby Nora Other Clinician: Referring Ligia Duguay: Kerby Nora Treating Yancy Knoble/Extender: Linwood Dibbles, HOYT Weeks in Treatment: 3 Vital Signs Height(in): 77 Pulse(bpm): 98 Weight(lbs): 265 Blood Pressure(mmHg): 156/106 Body Mass Index(BMI): 31 Temperature(F): 97.9 Respiratory  Rate(breaths/min): 18 Photos: [N/A:N/A] Wound Location: Left, Circumferential Lower Leg Right, Circumferential Lower Leg N/A Wounding Event: Gradually Appeared Gradually Appeared N/A Primary Etiology: Lymphedema Lymphedema N/A Comorbid History: Hypertension Hypertension N/A Date Acquired: 04/22/2019 04/22/2019 N/A Weeks of Treatment: 2 2 N/A Wound Status: Open Open N/A Measurements L x W x D (cm) 28x43x0.1 28x43x0.1 N/A Area (cm) : 945.619 945.619 N/A Volume (cm) : 94.562 94.562 N/A % Reduction in Area: 0.00% 0.00% N/A % Reduction in Volume: 0.00% 0.00% N/A Classification: Partial Thickness Full Thickness Without Exposed N/A Support Structures Exudate Amount: Large Large N/A Exudate Type: Purulent Purulent N/A Exudate Color: yellow, brown, green yellow, brown, green N/A Foul Odor After Cleansing: No Yes N/A Odor Anticipated Due to Product N/A No N/A Use: Wound Margin: Distinct, outline attached Indistinct, nonvisible N/A Granulation Amount: None Present (0%) None Present (0%) N/A Necrotic Amount: Large (67-100%) Large (67-100%) N/A Exposed Structures: Fat Layer (Subcutaneous Tissue): Fat Layer (Subcutaneous Tissue): N/A Yes Yes Fascia: No Fascia: No Tendon: No Tendon: No Muscle: No Muscle: No Joint: No Joint: No Bone: No Bone: No Epithelialization: Small (1-33%) None N/A Treatment Notes Electronic Signature(s) Signed: 10/12/2019 5:16:37 PM By: Tyler Aas Entered By: Tyler Aas on 10/12/2019 08:42:57 Jeffrey Lin ([id number]) -------------------------------------------------------------------------------- Multi-Disciplinary Care Plan Details Patient Name: Jeffrey Lin Date of Service: 10/12/2019 8:00 AM Medical Record Number: [id number] Patient Account Number: [id number] Date of Birth/Sex: [birth year] (44 y.o. M) Treating RN: Tyler Aas Primary Care Anthonette Lesage: Kerby Nora Other Clinician: Referring Earnest Thalman: Kerby Nora Treating  Wynema Garoutte/Extender: Linwood Dibbles, HOYT Weeks in Treatment: 3 Active Inactive Orientation to the Wound Care Program Nursing Diagnoses: Knowledge deficit related to the wound healing center program Goals: Patient/caregiver will verbalize understanding of the Wound Healing Center Program Date Initiated: 09/20/2019 Target Resolution Date: 09/24/2019 Goal Status: Active Interventions: Provide education on orientation to the wound center Notes: Wound/Skin Impairment Nursing Diagnoses: Impaired tissue integrity Knowledge deficit related to ulceration/compromised skin integrity Goals: Patient/caregiver will verbalize understanding of skin care regimen Date Initiated: 09/20/2019 Target Resolution Date: 10/01/2019 Goal Status: Active Interventions: Assess patient/caregiver ability to obtain necessary supplies Assess patient/caregiver ability to perform ulcer/skin care regimen upon admission and as needed Assess ulceration(s) every visit Provide education on ulcer and skin care Treatment Activities: Skin care regimen initiated : 09/20/2019 Topical wound management initiated : 09/20/2019 Notes: Electronic Signature(s) Signed: 10/12/2019 5:16:37 PM By: Tyler Aas Entered By: Tyler Aas on 10/12/2019 08:42:50 Jeffrey Lin ([id number]) -------------------------------------------------------------------------------- Pain Assessment Details Patient Name: Jeffrey Lin Date of Service: 10/12/2019 8:00 AM Medical Record Number: [id number] Patient Account Number: [id number] Date of Birth/Sex: [birth year] (44 y.o. M) Treating RN: Tyler Aas Primary Care Sharlot Sturkey: Kerby Nora Other Clinician: Referring Karishma Unrein: Kerby Nora Treating Daekwon Beswick/Extender: Linwood Dibbles, HOYT Weeks in Treatment: 3  Active Problems Location of Pain Severity and Description of Pain Patient Has Paino Yes Site Locations Rate the pain. Current Pain Level: 2 Worst Pain Level: 4 Least Pain Level:  1 Tolerable Pain Level: 5 Character of Pain Describe the Pain: Aching, Tender Pain Management and Medication Current Pain Management: Electronic Signature(s) Signed: 10/12/2019 12:40:51 PM By: Benna DunksLineberry, Carol Signed: 10/12/2019 5:16:37 PM By: Tyler AasButler, Michelle Entered By: Benna DunksLineberry, Carol on 10/12/2019 08:30:54 Jeffrey GillisSTEWART, Izyan G. ([id number]) -------------------------------------------------------------------------------- Patient/Caregiver Education Details Patient Name: Jeffrey GillisSTEWART, Devaris G. Date of Service: 10/12/2019 8:00 AM Medical Record Number: [id number] Patient Account Number: [id number] Date of Birth/Gender: 01/15/1976 (44 y.o. M) Treating RN: Tyler AasButler, Michelle Primary Care Physician: Kerby NoraBedsole, Amy Other Clinician: Referring Physician: Kerby NoraBedsole, Amy Treating Physician/Extender: Skeet SimmerSTONE III, HOYT Weeks in Treatment: 3 Education Assessment Education Provided To: Patient Education Topics Provided Wound/Skin Impairment: Methods: Explain/Verbal Responses: State content correctly Electronic Signature(s) Signed: 10/12/2019 5:16:37 PM By: Tyler AasButler, Michelle Entered By: Tyler AasButler, Michelle on 10/12/2019 08:43:39 Thammavong, Deeann SaintBOBBY G. ([id number]) -------------------------------------------------------------------------------- Wound Assessment Details Patient Name: Jeffrey GillisSTEWART, Darly G. Date of Service: 10/12/2019 8:00 AM Medical Record Number: [id number] Patient Account Number: [id number] Date of Birth/Sex: 08/15/1975 (44 y.o. M) Treating RN: Tyler AasButler, Michelle Primary Care Donaciano Range: Kerby NoraBedsole, Amy Other Clinician: Referring Ilyanna Baillargeon: Kerby NoraBedsole, Amy Treating Devina Bezold/Extender: Linwood DibblesSTONE III, HOYT Weeks in Treatment: 3 Wound Status Wound Number: 1 Primary Etiology: Lymphedema Wound Location: Left, Circumferential Lower Leg Wound Status: Open Wounding Event: Gradually Appeared Comorbid History: Hypertension Date Acquired: 04/22/2019 Weeks Of Treatment: 2 Clustered Wound: No Photos Wound Measurements Length:  (cm) 2.5 Width: (cm) 2 Depth: (cm) 0.1 Area: (cm) 3.927 Volume: (cm) 0.393 % Reduction in Area: 99.6% % Reduction in Volume: 99.6% Epithelialization: Small (1-33%) Wound Description Classification: Partial Thickness Fou Wound Margin: Distinct, outline attached Slo Exudate Amount: Large Exudate Type: Purulent Exudate Color: yellow, brown, green l Odor After Cleansing: No ugh/Fibrino Yes Wound Bed Granulation Amount: None Present (0%) Exposed Structure Necrotic Amount: Large (67-100%) Fascia Exposed: No Necrotic Quality: Adherent Slough Fat Layer (Subcutaneous Tissue) Exposed: Yes Tendon Exposed: No Muscle Exposed: No Joint Exposed: No Bone Exposed: No Treatment Notes Wound #1 (Left, Circumferential Lower Leg) Notes scell, xsorb, UBL Electronic Signature(s) Signed: 10/12/2019 5:16:37 PM By: Joline MaxcyButler, Michelle Lenahan, Deeann SaintBOBBY G. ([id number]) Entered By: Tyler AasButler, Michelle on 10/12/2019 08:48:27 Jeffrey GillisSTEWART, Rishan G. ([id number]) -------------------------------------------------------------------------------- Wound Assessment Details Patient Name: Jeffrey GillisSTEWART, Derron G. Date of Service: 10/12/2019 8:00 AM Medical Record Number: [id number] Patient Account Number: [id number] Date of Birth/Sex: 10/29/1975 (44 y.o. M) Treating RN: Tyler AasButler, Michelle Primary Care Champ Keetch: Kerby NoraBedsole, Amy Other Clinician: Referring Varetta Chavers: Kerby NoraBedsole, Amy Treating Nobuo Nunziata/Extender: Linwood DibblesSTONE III, HOYT Weeks in Treatment: 3 Wound Status Wound Number: 2 Primary Etiology: Lymphedema Wound Location: Right, Circumferential Lower Leg Wound Status: Open Wounding Event: Gradually Appeared Comorbid History: Hypertension Date Acquired: 04/22/2019 Weeks Of Treatment: 2 Clustered Wound: No Photos Wound Measurements Length: (cm) 0.2 Width: (cm) 0.3 Depth: (cm) 0.1 Area: (cm) 0.047 Volume: (cm) 0.005 % Reduction in Area: 100% % Reduction in Volume: 100% Epithelialization: None Wound Description Classification: Full  Thickness Without Exposed Support Struct Wound Margin: Indistinct, nonvisible Exudate Amount: Large Exudate Type: Purulent Exudate Color: yellow, brown, green ures Foul Odor After Cleansing: Yes Due to Product Use: No Slough/Fibrino Yes Wound Bed Granulation Amount: None Present (0%) Exposed Structure Necrotic Amount: Large (67-100%) Fascia Exposed: No Necrotic Quality: Adherent Slough Fat Layer (Subcutaneous Tissue) Exposed: Yes Tendon Exposed: No Muscle Exposed: No Joint Exposed: No Bone Exposed: No Treatment Notes Wound #2 (Right, Circumferential Lower Leg) Notes scell, xsorb, UBL Electronic  Signature(s) Signed: 10/12/2019 5:16:37 PM By: Joline Maxcy, Deeann Saint ([id number]) Entered By: Tyler Aas on 10/12/2019 08:48:28 MCKENNON, ZWART ([id number]) -------------------------------------------------------------------------------- Vitals Details Patient Name: Jeffrey Lin Date of Service: 10/12/2019 8:00 AM Medical Record Number: [id number] Patient Account Number: [id number] Date of Birth/Sex: Nov 04, 1975 (45 y.o. M) Treating RN: Tyler Aas Primary Care Brendaliz Kuk: Kerby Nora Other Clinician: Referring Sharyn Brilliant: Kerby Nora Treating Bryker Fletchall/Extender: Linwood Dibbles, HOYT Weeks in Treatment: 3 Vital Signs Time Taken: 08:08 Temperature (F): 97.9 Height (in): 77 Pulse (bpm): 98 Weight (lbs): 265 Respiratory Rate (breaths/min): 18 Body Mass Index (BMI): 31.4 Blood Pressure (mmHg): 156/106 Reference Range: 80 - 120 mg / dl Electronic Signature(s) Signed: 10/12/2019 4:27:05 PM By: Dayton Martes RCP, RRT, CHT Entered By: Dayton Martes on 10/12/2019 08:12:23

## 2019-10-13 NOTE — Telephone Encounter (Signed)
Patient calling back in stating he is going to the wound care center on Tuesday and is wondering if the letter can be sent to that office for him to sign. Patient states the office is at the [facility]. He has an appointment there at 8 am

## 2019-10-14 ENCOUNTER — Encounter (INDEPENDENT_AMBULATORY_CARE_PROVIDER_SITE_OTHER): Payer: PPO

## 2019-10-14 NOTE — Telephone Encounter (Signed)
Spoke with patient and informed him of [employer] note. He was very appreciative of the phone call.

## 2019-10-14 NOTE — Telephone Encounter (Signed)
Contacted Natalia Leatherwood [phone]) regarding need for 'Appointment of representation form'. Have not received via fax. Left VM with fax number as well as my [place] email address. Requested call back.   Have not heard from wound care center via Epic staff message so will call their office once form received so we can hopefully fax or email it to them and patient may sign at upcoming appointment.   Alver Sorrow, NP

## 2019-10-15 NOTE — Telephone Encounter (Signed)
Spoke with Natalia Leatherwood. Form for patient to sign will be faxed and emailed to me. Hopeful he will be able to sign at upcoming appointment with wound clinic. Will defer calling their office until the form received.   Alver Sorrow, NP

## 2019-10-18 NOTE — Telephone Encounter (Signed)
Appeal form to be signed by Mr. Rust has been faxed to the [facility] for completion at his appointment 10/19/19.   After it is signed, it will be returned via fax to our office and sent to the company.  Called Mr. Poland - spoke with his mother per DPR. Let her know that the form would be available to sign at his appointment with wound care clinic tomorrow. She was appreciative of call and verbalized understanding.   Alver Sorrow, NP

## 2019-10-19 ENCOUNTER — Other Ambulatory Visit: Payer: Self-pay

## 2019-10-19 ENCOUNTER — Other Ambulatory Visit (INDEPENDENT_AMBULATORY_CARE_PROVIDER_SITE_OTHER): Payer: Self-pay | Admitting: Physician Assistant

## 2019-10-19 ENCOUNTER — Encounter: Payer: PPO | Admitting: Physician Assistant

## 2019-10-19 DIAGNOSIS — L97811 Non-pressure chronic ulcer of other part of right lower leg limited to breakdown of skin: Secondary | ICD-10-CM | POA: Diagnosis not present

## 2019-10-19 DIAGNOSIS — R609 Edema, unspecified: Secondary | ICD-10-CM

## 2019-10-19 DIAGNOSIS — I10 Essential (primary) hypertension: Secondary | ICD-10-CM | POA: Diagnosis not present

## 2019-10-19 DIAGNOSIS — M79669 Pain in unspecified lower leg: Secondary | ICD-10-CM

## 2019-10-19 DIAGNOSIS — I890 Lymphedema, not elsewhere classified: Secondary | ICD-10-CM | POA: Diagnosis not present

## 2019-10-19 DIAGNOSIS — L97821 Non-pressure chronic ulcer of other part of left lower leg limited to breakdown of skin: Secondary | ICD-10-CM | POA: Diagnosis not present

## 2019-10-19 NOTE — Progress Notes (Addendum)
Jeffrey, Lin ([id number]) Visit Report for 10/19/2019 Chief Complaint Document Details Patient Name: Jeffrey Lin, Jeffrey Lin Date of Service: 10/19/2019 8:00 AM Medical Record Number: [id number] Patient Account Number: [id number] Date of Birth/Sex: 10-12-1975 (44 y.o. M) Treating RN: Tyler Aas Primary Care Provider: Kerby Nora Other Clinician: Referring Provider: Kerby Nora Treating Provider/Extender: Linwood Dibbles, Letti Towell Weeks in Treatment: 4 Information Obtained from: Patient Chief Complaint Bilateral LE Lymphedema Electronic Signature(s) Signed: 10/19/2019 8:35:12 AM By: Lenda Kelp PA-C Entered By: Lenda Kelp on 10/19/2019 08:35:11 AQUILA, DELAUGHTER ([id number]) -------------------------------------------------------------------------------- HPI Details Patient Name: Jeffrey Lin Date of Service: 10/19/2019 8:00 AM Medical Record Number: [id number] Patient Account Number: [id number] Date of Birth/Sex: 23-Sep-1975 (44 y.o. M) Treating RN: Tyler Aas Primary Care Provider: Kerby Nora Other Clinician: Referring Provider: Kerby Nora Treating Provider/Extender: Linwood Dibbles, Arhan Mcmanamon Weeks in Treatment: 4 History of Present Illness HPI Description: 09/20/2019 upon evaluation today patient presents for initial evaluation in our clinic concerning issues that he has been having with wounds over the bilateral lower extremities. Both legs seem to be fairly equally affected at this point based on what I am seeing. With that being said the patient also appears to be having some drainage which is somewhat blue/green in nature and does have me concerned about the possibility of infection. I explained this often can indicate a Pseudomonas infection despite the fact that the patient has been on antibiotics currently this could still be problematic. He currently does not have home health that is something that we did discussed the potential for looking into. I think that could be  beneficial for him to be honest. The patient seems somewhat frustrated with the situation in general. He does have a history of lymphedema, hypertension, and he does ambulate and walk though not a lot and tells me that he really does not drive unless is absolutely forced for something he absolutely has to do otherwise he is essentially homebound at this point. He did have a believe his mother with him today. 09/28/2019 on evaluation today patient appears to be doing better in regard to his leg ulcers at this point. The antibiotic does seem to be helping and overall he tells me that his pain is less though he still is having discomfort. Fortunately there is no signs of active infection at this time. 10/04/2019 upon evaluation today patient appears to be doing well at this point in regard to his wounds. He in fact is improving dramatically from the standpoint of his infection which I think is under great control. I see no evidence of infection systemically at this point and overall I am extremely pleased I think he is getting close to resolution to be honest though he still has several scattered areas at this point. 10/12/2019 upon evaluation today patient actually seems to be making excellent progress in regard to his leg ulcers at this point. He just has a small area on the left posterior and a small area on the right anterior leg that are still open. Everything else has completely resolved and seems to have done excellent up to this point. 10/19/2019 upon evaluation today patient actually appears to be doing much better on the right leg with regard to the open wounds in fact there is nothing remaining at this point which is great news. Overall I am very pleased with where things stand. I do believe that the compression has helped he still has edema which is quite significant I still believe lymphedema pumps would be  beneficial for him. With regard to the left leg he still has again the edema and this is  nonetheless quite significant despite compression therapy but still I do believe that he is doing much better with the compression I do believe again lymphedema pumps would be of benefit in this regard as well. He has been trying to elevate and move around/exercise much as possible but nonetheless I still think that the compression which he does have his compression socks today as well as lymphedema pumps would greatly benefit him as far as preventing future ulcerations/blisters that would lead to further expensive wound care visits. Electronic Signature(s) Signed: 10/19/2019 8:46:03 AM By: Lenda Kelp PA-C Entered By: Lenda Kelp on 10/19/2019 08:46:02 KIRKLAND, FIGG ([id number]) -------------------------------------------------------------------------------- Physical Exam Details Patient Name: Jeffrey Lin Date of Service: 10/19/2019 8:00 AM Medical Record Number: [id number] Patient Account Number: [id number] Date of Birth/Sex: 07-12-1975 (44 y.o. M) Treating RN: Tyler Aas Primary Care Provider: Kerby Nora Other Clinician: Referring Provider: Kerby Nora Treating Provider/Extender: Linwood Dibbles, Danyell Awbrey Weeks in Treatment: 4 Constitutional Obese and well-hydrated in no acute distress. Respiratory normal breathing without difficulty. Cardiovascular Patient has bilateral stage II bordering on stage III lymphedema. He continues to have swelling/edema of the legs despite physical activity/exercise, elevation, and compression which we have been utilizing over the past 5 weeks here in the clinic. He is now transitioning to compression sock on the right leg and was noted to be compression wrapping his left leg today. His symptoms are persisting with significant lymphedema still noted despite conservative measures and in this regard I feel like the lymphedema pumps would be of benefit for him. I also believe lymphedema pumps would help with preventing this from progressing to a  more significant grade of lymphedema going forward with better edema control overall.Marland Kitchen Psychiatric this patient is able to make decisions and demonstrates good insight into disease process. Alert and Oriented x 3. pleasant and cooperative. Notes Upon inspection patient's wound bed today on the right showed signs of complete epithelization this is great news I feel like the infection has also resolved which is excellent. On the left he has a very small area on the posterior aspect still open the majority of this is completely closed and looks to be doing well I am very pleased at this time. Electronic Signature(s) Signed: 10/19/2019 8:48:26 AM By: Lenda Kelp PA-C Entered By: Lenda Kelp on 10/19/2019 08:48:25 JUJUAN, DUGO ([id number]) -------------------------------------------------------------------------------- Physician Orders Details Patient Name: Jeffrey Lin Date of Service: 10/19/2019 8:00 AM Medical Record Number: [id number] Patient Account Number: [id number] Date of Birth/Sex: September 19, 1975 (44 y.o. M) Treating RN: Tyler Aas Primary Care Provider: Kerby Nora Other Clinician: Referring Provider: Kerby Nora Treating Provider/Extender: Linwood Dibbles, Kerem Gilmer Weeks in Treatment: 4 Verbal / Phone Orders: No Diagnosis Coding ICD-10 Coding Code Description I89.0 Lymphedema, not elsewhere classified L97.821 Non-pressure chronic ulcer of other part of left lower leg limited to breakdown of skin L97.811 Non-pressure chronic ulcer of other part of right lower leg limited to breakdown of skin I10 Essential (primary) hypertension Primary Wound Dressing Wound #1 Left,Circumferential Lower Leg o Silver Alginate Secondary Dressing Wound #1 Left,Circumferential Lower Leg o ABD pad Dressing Change Frequency o Change dressing every week Follow-up Appointments o Return Appointment in 1 week. o Nurse Visit as needed Edema Control o Unna Boot to Left Lower  Extremity o Elevate legs to the level of the heart and pump ankles as often as possible Electronic Signature(s) Signed:  10/19/2019 3:26:17 PM By: Tyler Aas Signed: 10/19/2019 5:47:57 PM By: Lenda Kelp PA-C Entered By: Tyler Aas on 10/19/2019 08:43:09 MATTIS, FEATHERLY ([id number]) -------------------------------------------------------------------------------- Problem List Details Patient Name: Jeffrey Lin Date of Service: 10/19/2019 8:00 AM Medical Record Number: [id number] Patient Account Number: [id number] Date of Birth/Sex: [birth year] (44 y.o. M) Treating RN: Tyler Aas Primary Care Provider: Kerby Nora Other Clinician: Referring Provider: Kerby Nora Treating Provider/Extender: Linwood Dibbles, Crystallynn Noorani Weeks in Treatment: 4 Active Problems ICD-10 Encounter Code Description Active Date MDM Diagnosis I89.0 Lymphedema, not elsewhere classified 09/20/2019 No Yes L97.821 Non-pressure chronic ulcer of other part of left lower leg limited to 09/20/2019 No Yes breakdown of skin L97.811 Non-pressure chronic ulcer of other part of right lower leg limited to 09/20/2019 No Yes breakdown of skin I10 Essential (primary) hypertension 09/20/2019 No Yes Inactive Problems Resolved Problems Electronic Signature(s) Signed: 10/19/2019 8:35:05 AM By: Lenda Kelp PA-C Entered By: Lenda Kelp on 10/19/2019 08:35:04 Balzarini, Deeann Saint ([id number]) -------------------------------------------------------------------------------- Progress Note Details Patient Name: Jeffrey Lin Date of Service: 10/19/2019 8:00 AM Medical Record Number: [id number] Patient Account Number: [id number] Date of Birth/Sex: 1975-08-19 (44 y.o. M) Treating RN: Tyler Aas Primary Care Provider: Kerby Nora Other Clinician: Referring Provider: Kerby Nora Treating Provider/Extender: Linwood Dibbles, Soham Hollett Weeks in Treatment: 4 Subjective Chief Complaint Information obtained from  Patient Bilateral LE Lymphedema History of Present Illness (HPI) 09/20/2019 upon evaluation today patient presents for initial evaluation in our clinic concerning issues that he has been having with wounds over the bilateral lower extremities. Both legs seem to be fairly equally affected at this point based on what I am seeing. With that being said the patient also appears to be having some drainage which is somewhat blue/green in nature and does have me concerned about the possibility of infection. I explained this often can indicate a Pseudomonas infection despite the fact that the patient has been on antibiotics currently this could still be problematic. He currently does not have home health that is something that we did discussed the potential for looking into. I think that could be beneficial for him to be honest. The patient seems somewhat frustrated with the situation in general. He does have a history of lymphedema, hypertension, and he does ambulate and walk though not a lot and tells me that he really does not drive unless is absolutely forced for something he absolutely has to do otherwise he is essentially homebound at this point. He did have a believe his mother with him today. 09/28/2019 on evaluation today patient appears to be doing better in regard to his leg ulcers at this point. The antibiotic does seem to be helping and overall he tells me that his pain is less though he still is having discomfort. Fortunately there is no signs of active infection at this time. 10/04/2019 upon evaluation today patient appears to be doing well at this point in regard to his wounds. He in fact is improving dramatically from the standpoint of his infection which I think is under great control. I see no evidence of infection systemically at this point and overall I am extremely pleased I think he is getting close to resolution to be honest though he still has several scattered areas at this  point. 10/12/2019 upon evaluation today patient actually seems to be making excellent progress in regard to his leg ulcers at this point. He just has a small area on the left posterior and a small area on the right anterior  leg that are still open. Everything else has completely resolved and seems to have done excellent up to this point. 10/19/2019 upon evaluation today patient actually appears to be doing much better on the right leg with regard to the open wounds in fact there is nothing remaining at this point which is great news. Overall I am very pleased with where things stand. I do believe that the compression has helped he still has edema which is quite significant I still believe lymphedema pumps would be beneficial for him. With regard to the left leg he still has again the edema and this is nonetheless quite significant despite compression therapy but still I do believe that he is doing much better with the compression I do believe again lymphedema pumps would be of benefit in this regard as well. He has been trying to elevate and move around/exercise much as possible but nonetheless I still think that the compression which he does have his compression socks today as well as lymphedema pumps would greatly benefit him as far as preventing future ulcerations/blisters that would lead to further expensive wound care visits. Objective Constitutional Obese and well-hydrated in no acute distress. Vitals Time Taken: 8:20 AM, Height: 77 in, Weight: 265 lbs, BMI: 31.4, Temperature: 98.5 F, Pulse: 103 bpm, Respiratory Rate: 16 breaths/min, Blood Pressure: 159/96 mmHg. Respiratory normal breathing without difficulty. Cardiovascular Patient has bilateral stage II bordering on stage III lymphedema. He continues to have swelling/edema of the legs despite physical activity/exercise, elevation, and compression which we have been utilizing over the past 5 weeks here in the clinic. He is now  transitioning to compression sock on the right leg and was noted to be compression wrapping his left leg today. His symptoms are persisting with significant lymphedema still noted despite conservative measures and in this regard I feel like the lymphedema pumps would be of benefit for him. I also believe lymphedema pumps would help with preventing this from progressing to a more significant grade of lymphedema going forward with better edema control overall.Marland Kitchen. Psychiatric Jeffrey GillisSTEWART, Tabb G. ([id number]) this patient is able to make decisions and demonstrates good insight into disease process. Alert and Oriented x 3. pleasant and cooperative. General Notes: Upon inspection patient's wound bed today on the right showed signs of complete epithelization this is great news I feel like the infection has also resolved which is excellent. On the left he has a very small area on the posterior aspect still open the majority of this is completely closed and looks to be doing well I am very pleased at this time. Integumentary (Hair, Skin) Wound #1 status is Open. Original cause of wound was Gradually Appeared. The wound is located on the Left,Circumferential Lower Leg. The wound measures 2.3cm length x 2cm width x 0.1cm depth; 3.613cm^2 area and 0.361cm^3 volume. There is Fat Layer (Subcutaneous Tissue) exposed. There is a large amount of purulent drainage noted. The wound margin is distinct with the outline attached to the wound base. There is no granulation within the wound bed. There is a large (67-100%) amount of necrotic tissue within the wound bed including Adherent Slough. Wound #2 status is Healed - Epithelialized. Original cause of wound was Gradually Appeared. The wound is located on the Right,Circumferential Lower Leg. The wound measures 0cm length x 0cm width x 0cm depth; 0cm^2 area and 0cm^3 volume. There is Fat Layer (Subcutaneous Tissue) exposed. There is a large amount of purulent drainage noted.  Foul odor after cleansing was noted. The wound margin  is indistinct and nonvisible. There is no granulation within the wound bed. There is a large (67-100%) amount of necrotic tissue within the wound bed including Adherent Slough. 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 There was a Radio broadcast assistant Compression Therapy Procedure by Tyler Aas, RN. Post procedure Diagnosis Wound #1: Same as Pre-Procedure Plan Primary Wound Dressing: Wound #1 Left,Circumferential Lower Leg: Silver Alginate Secondary Dressing: Wound #1 Left,Circumferential Lower Leg: ABD pad Dressing Change Frequency: Change dressing every week Follow-up Appointments: Return Appointment in 1 week. Nurse Visit as needed Edema Control: Unna Boot to Left Lower Extremity Elevate legs to the level of the heart and pump ankles as often as possible 1. I would recommend at this time we continue with the compression wrap of the left lower extremity. This is good to be a Radio broadcast assistant wrap. He seems to be tolerating this well and overall it has done excellent as far as helping to get the open ulcerations to seal up. 2. I would recommend that he use the compression sock on the right leg which I think will also be beneficial to keep that under control now that he is healed on that side. 3. I am also going to suggest he continue to elevate his legs when she has been doing he also sleeps in the bed which is good news I explained that he wants to continue to do both of these things despite the healing of the right leg. RAYGEN, DAHM ([id number]) 4. I am also going to recommend at  this point that we continue with the silver alginate followed by ABD pad and Unna boot on the right leg until we get this completely closed hopefully that will be next week. 5. With regard to lymphedema pumps the patient does have stage II lymphedema which is bordering on stage III. He has been doing the conservative things such as exercising as much as he can, elevation, and we have been seeing him for 5 weeks currently utilizing compression therapy and now he is transitioning into a compression sock on the right and will be hopefully next week on the left. Nonetheless he still has significant edema of both legs and I believe the lymphedema pumps would help to prevent this edema from further exacerbating his issues with his legs long-term by helping to control the edema now in the short-term. If there are any further questions in this regard I am definitely available for any additional commentary on the patient's need for lymphedema pumps but in general I think that they would greatly benefit him and prevent future/costly visits to the wound care center as frequently. We will see patient back for reevaluation in 1 week here in the clinic. If anything worsens or changes patient will contact our office for additional recommendations. Electronic Signature(s) Signed: 10/19/2019 8:51:00 AM By: Lenda Kelp PA-C Entered By: Lenda Kelp on 10/19/2019 08:50:59 ISAO, SELTZER ([id number]) -------------------------------------------------------------------------------- SuperBill Details Patient Name: Jeffrey Lin Date of Service: 10/19/2019 Medical Record Number: [id number] Patient Account Number: [id number] Date of Birth/Sex: 04/14/1975 (44 y.o. M) Treating RN: Tyler Aas Primary Care Provider: Kerby Nora Other Clinician: Referring Provider: Kerby Nora Treating Provider/Extender: Linwood Dibbles, [place] Pechacek Weeks  in Treatment: 4 Diagnosis Coding ICD-10 Codes Code Description I89.0  Lymphedema, not elsewhere classified L97.821 Non-pressure chronic ulcer of other part of left lower leg limited to breakdown of skin L97.811 Non-pressure chronic ulcer of other part of right lower leg limited to breakdown of skin I10 Essential (primary) hypertension Facility Procedures CPT4 Code: [id number] Description: (Facility Use Only) [phone] - APPLY MULTLAY COMPRS LWR LT LEG Modifier: Quantity: 1 Physician Procedures CPT4 Code: [id number] Description: 99214 - WC PHYS LEVEL 4 - EST PT Modifier: Quantity: 1 CPT4 Code: Description: ICD-10 Diagnosis Description I89.0 Lymphedema, not elsewhere classified L97.821 Non-pressure chronic ulcer of other part of left lower leg limited to brea L97.811 Non-pressure chronic ulcer of other part of right lower leg limited to bre I10  Essential (primary) hypertension Modifier: kdown of skin akdown of skin Quantity: Electronic Signature(s) Signed: 10/19/2019 8:51:30 AM By: Lenda Kelp PA-C Entered By: Lenda Kelp on 10/19/2019 08:51:29

## 2019-10-19 NOTE — Telephone Encounter (Signed)
Dr. Allen Derry called the office to state patient is in the office today and will be signing the form. Dr. Larina Bras also stated he documented in detail regarding the patients lymphedema and will be faxing that note over as well with the form

## 2019-10-20 ENCOUNTER — Encounter (INDEPENDENT_AMBULATORY_CARE_PROVIDER_SITE_OTHER): Payer: PPO

## 2019-10-20 NOTE — Telephone Encounter (Signed)
Signed form received from [facility].   Sent to Natalia Leatherwood of [employer] via secure email.   Alver Sorrow, NP

## 2019-10-25 ENCOUNTER — Ambulatory Visit (INDEPENDENT_AMBULATORY_CARE_PROVIDER_SITE_OTHER): Payer: PPO

## 2019-10-25 ENCOUNTER — Encounter: Payer: PPO | Attending: Physician Assistant | Admitting: Physician Assistant

## 2019-10-25 ENCOUNTER — Other Ambulatory Visit: Payer: Self-pay

## 2019-10-25 DIAGNOSIS — I890 Lymphedema, not elsewhere classified: Secondary | ICD-10-CM | POA: Diagnosis not present

## 2019-10-25 DIAGNOSIS — M79661 Pain in right lower leg: Secondary | ICD-10-CM | POA: Diagnosis not present

## 2019-10-25 DIAGNOSIS — L97821 Non-pressure chronic ulcer of other part of left lower leg limited to breakdown of skin: Secondary | ICD-10-CM | POA: Insufficient documentation

## 2019-10-25 DIAGNOSIS — M79662 Pain in left lower leg: Secondary | ICD-10-CM | POA: Diagnosis not present

## 2019-10-25 DIAGNOSIS — I10 Essential (primary) hypertension: Secondary | ICD-10-CM | POA: Diagnosis not present

## 2019-10-25 DIAGNOSIS — R609 Edema, unspecified: Secondary | ICD-10-CM

## 2019-10-25 DIAGNOSIS — M79669 Pain in unspecified lower leg: Secondary | ICD-10-CM

## 2019-10-25 NOTE — Progress Notes (Addendum)
Jeffrey Lin, Jeffrey Lin ([id number]) Visit Report for 10/25/2019 Chief Complaint Document Details Patient Name: Jeffrey Lin Date of Service: 10/25/2019 2:15 PM Medical Record Number: [id number] Patient Account Number: [id number] Date of Birth/Sex: Mar 08, 1975 (44 y.o. M) Treating RN: Rodell Perna Primary Care Provider: Kerby Nora Other Clinician: Referring Provider: Kerby Nora Treating Provider/Extender: Jeffrey Lin, Jeffrey Lin in Treatment: 5 Information Obtained from: Patient Chief Complaint Bilateral LE Lymphedema Electronic Signature(s) Signed: 10/25/2019 2:51:31 PM By: Lenda Kelp PA-C Entered By: Lenda Kelp on 10/25/2019 14:51:30 Jeffrey Lin ([id number]) -------------------------------------------------------------------------------- HPI Details Patient Name: Jeffrey Lin Date of Service: 10/25/2019 2:15 PM Medical Record Number: [id number] Patient Account Number: [id number] Date of Birth/Sex: 1975-07-12 (43 y.o. M) Treating RN: Rodell Perna Primary Care Provider: Kerby Nora Other Clinician: Referring Provider: Kerby Nora Treating Provider/Extender: Jeffrey Lin, Isabellarose Kope Lin in Treatment: 5 History of Present Illness HPI Description: 09/20/2019 upon evaluation today patient presents for initial evaluation in our clinic concerning issues that he has been having with wounds over the bilateral lower extremities. Both legs seem to be fairly equally affected at this point based on what I am seeing. With that being said the patient also appears to be having some drainage which is somewhat blue/green in nature and does have me concerned about the possibility of infection. I explained this often can indicate a Pseudomonas infection despite the fact that the patient has been on antibiotics currently this could still be problematic. He currently does not have home health that is something that we did discussed the potential for looking into. I think that could be beneficial  for him to be honest. The patient seems somewhat frustrated with the situation in general. He does have a history of lymphedema, hypertension, and he does ambulate and walk though not a lot and tells me that he really does not drive unless is absolutely forced for something he absolutely has to do otherwise he is essentially homebound at this point. He did have a believe his mother with him today. 09/28/2019 on evaluation today patient appears to be doing better in regard to his leg ulcers at this point. The antibiotic does seem to be helping and overall he tells me that his pain is less though he still is having discomfort. Fortunately there is no signs of active infection at this time. 10/04/2019 upon evaluation today patient appears to be doing well at this point in regard to his wounds. He in fact is improving dramatically from the standpoint of his infection which I think is under great control. I see no evidence of infection systemically at this point and overall I am extremely pleased I think he is getting close to resolution to be honest though he still has several scattered areas at this point. 10/12/2019 upon evaluation today patient actually seems to be making excellent progress in regard to his leg ulcers at this point. He just has a small area on the left posterior and a small area on the right anterior leg that are still open. Everything else has completely resolved and seems to have done excellent up to this point. 10/19/2019 upon evaluation today patient actually appears to be doing much better on the right leg with regard to the open wounds in fact there is nothing remaining at this point which is great news. Overall I am very pleased with where things stand. I do believe that the compression has helped he still has edema which is quite significant I still believe lymphedema pumps would be  beneficial for him. With regard to the left leg he still has again the edema and this is nonetheless  quite significant despite compression therapy but still I do believe that he is doing much better with the compression I do believe again lymphedema pumps would be of benefit in this regard as well. He has been trying to elevate and move around/exercise much as possible but nonetheless I still think that the compression which he does have his compression socks today as well as lymphedema pumps would greatly benefit him as far as preventing future ulcerations/blisters that would lead to further expensive wound care visits. 10/25/2019 on evaluation today patient appears to be doing excellent in regard to his legs bilaterally. In fact there is only one small area on the posterior aspect of his left leg that appears to be an issue at this time. Fortunately there is no signs of active infection which is great news. No fevers, chills, nausea, vomiting, or diarrhea. I do believe that for all intents purposes this is completely healed I think it would be appropriate for us to make a referral to the lymphedema clinic for him at this point. Electronic Signature(s) Signed: 10/25/2019 3:47:53 PM By: Lenda KelpStone III, Zaydee Aina PA-C Entered By: Lenda KelpStone III, Arnetha Silverthorne on 10/25/2019 15:47:52 Jeffrey Lin, Jeffrey G. ([id number]) -------------------------------------------------------------------------------- Physical Exam Details Patient Name: Jeffrey Lin, Jeffrey G. Date of Service: 10/25/2019 2:15 PM Medical Record Number: [id number] Patient Account Number: [id number] Date of Birth/Sex: 04/27/1975 (44 y.o. M) Treating RN: Rodell PernaScott, Dajea Primary Care Provider: Kerby NoraBedsole, Amy Other Clinician: Referring Provider: Kerby NoraBedsole, Amy Treating Provider/Extender: Jeffrey DibblesSTONE III, Dahir Ayer Lin in Treatment: 5 Constitutional Obese and well-hydrated in no acute distress. Respiratory normal breathing without difficulty. Psychiatric this patient is able to make decisions and demonstrates good insight into disease process. Alert and Oriented x 3. pleasant and  cooperative. Notes Upon inspection patient's wound bed actually showed signs of good granulation at this time. There does not appear to be any evidence of active infection and in fact he has pretty much all new epithelial growth noted at this time. Overall I feel like he is making great progress and again is much healed based on what I am seeing today. Electronic Signature(s) Signed: 10/25/2019 3:48:27 PM By: Lenda KelpStone III, Lashann Hagg PA-C Entered By: Lenda KelpStone III, Warnie Belair on 10/25/2019 15:48:27 Jeffrey Lin, Jeffrey G. ([id number]) -------------------------------------------------------------------------------- Physician Orders Details Patient Name: Jeffrey Lin, Jeffrey G. Date of Service: 10/25/2019 2:15 PM Medical Record Number: [id number] Patient Account Number: [id number] Date of Birth/Sex: 12/07/1975 (44 y.o. M) Treating RN: Rodell PernaScott, Dajea Primary Care Provider: Kerby NoraBedsole, Amy Other Clinician: Referring Provider: Kerby NoraBedsole, Amy Treating Provider/Extender: Jeffrey DibblesSTONE III, Devanie Galanti Lin in Treatment: 5 Verbal / Phone Orders: No Diagnosis Coding ICD-10 Coding Code Description I89.0 Lymphedema, not elsewhere classified L97.821 Non-pressure chronic ulcer of other part of left lower leg limited to breakdown of skin L97.811 Non-pressure chronic ulcer of other part of right lower leg limited to breakdown of skin I10 Essential (primary) hypertension Primary Wound Dressing Wound #1 Left,Circumferential Lower Leg o Silver Alginate Secondary Dressing Wound #1 Left,Circumferential Lower Leg o ABD pad Dressing Change Frequency o Change dressing every week Follow-up Appointments o Return Appointment in 1 week. o Nurse Visit as needed Edema Control o Unna Boot to Left Lower Extremity o Elevate legs to the level of the heart and pump ankles as often as possible Services and Therapies o Lymphedema Clinic - referral Electronic Signature(s) Signed: 10/25/2019 3:57:50 PM By: Lenda KelpStone III, Willy Pinkerton PA-C Signed: 10/27/2019  8:11:02 AM By: Rodell PernaScott, Dajea Entered By:  Rodell Perna on 10/25/2019 14:58:41 COBE, VINEY ([id number]) -------------------------------------------------------------------------------- Problem List Details Patient Name: YISHAI, REHFELD Date of Service: 10/25/2019 2:15 PM Medical Record Number: [id number] Patient Account Number: [id number] Date of Birth/Sex: [birth year] (44 y.o. M) Treating RN: Rodell Perna Primary Care Provider: Kerby Nora Other Clinician: Referring Provider: Kerby Nora Treating Provider/Extender: Jeffrey Lin, Sulayman Manning Lin in Treatment: 5 Active Problems ICD-10 Encounter Code Description Active Date MDM Diagnosis I89.0 Lymphedema, not elsewhere classified 09/20/2019 No Yes L97.821 Non-pressure chronic ulcer of other part of left lower leg limited to 09/20/2019 No Yes breakdown of skin L97.811 Non-pressure chronic ulcer of other part of right lower leg limited to 09/20/2019 No Yes breakdown of skin I10 Essential (primary) hypertension 09/20/2019 No Yes Inactive Problems Resolved Problems Electronic Signature(s) Signed: 10/25/2019 2:51:24 PM By: Lenda Kelp PA-C Entered By: Lenda Kelp on 10/25/2019 14:51:24 Jeffrey Lin ([id number]) -------------------------------------------------------------------------------- Progress Note Details Patient Name: Jeffrey Lin Date of Service: 10/25/2019 2:15 PM Medical Record Number: [id number] Patient Account Number: [id number] Date of Birth/Sex: January 24, 1975 (44 y.o. M) Treating RN: Rodell Perna Primary Care Provider: Kerby Nora Other Clinician: Referring Provider: Kerby Nora Treating Provider/Extender: Jeffrey Lin, Cynithia Hakimi Lin in Treatment: 5 Subjective Chief Complaint Information obtained from Patient Bilateral LE Lymphedema History of Present Illness (HPI) 09/20/2019 upon evaluation today patient presents for initial evaluation in our clinic concerning issues that he has been having with wounds over the  bilateral lower extremities. Both legs seem to be fairly equally affected at this point based on what I am seeing. With that being said the patient also appears to be having some drainage which is somewhat blue/green in nature and does have me concerned about the possibility of infection. I explained this often can indicate a Pseudomonas infection despite the fact that the patient has been on antibiotics currently this could still be problematic. He currently does not have home health that is something that we did discussed the potential for looking into. I think that could be beneficial for him to be honest. The patient seems somewhat frustrated with the situation in general. He does have a history of lymphedema, hypertension, and he does ambulate and walk though not a lot and tells me that he really does not drive unless is absolutely forced for something he absolutely has to do otherwise he is essentially homebound at this point. He did have a believe his mother with him today. 09/28/2019 on evaluation today patient appears to be doing better in regard to his leg ulcers at this point. The antibiotic does seem to be helping and overall he tells me that his pain is less though he still is having discomfort. Fortunately there is no signs of active infection at this time. 10/04/2019 upon evaluation today patient appears to be doing well at this point in regard to his wounds. He in fact is improving dramatically from the standpoint of his infection which I think is under great control. I see no evidence of infection systemically at this point and overall I am extremely pleased I think he is getting close to resolution to be honest though he still has several scattered areas at this point. 10/12/2019 upon evaluation today patient actually seems to be making excellent progress in regard to his leg ulcers at this point. He just has a small area on the left posterior and a small area on the right anterior leg  that are still open. Everything else has completely resolved and seems to have done excellent up  to this point. 10/19/2019 upon evaluation today patient actually appears to be doing much better on the right leg with regard to the open wounds in fact there is nothing remaining at this point which is great news. Overall I am very pleased with where things stand. I do believe that the compression has helped he still has edema which is quite significant I still believe lymphedema pumps would be beneficial for him. With regard to the left leg he still has again the edema and this is nonetheless quite significant despite compression therapy but still I do believe that he is doing much better with the compression I do believe again lymphedema pumps would be of benefit in this regard as well. He has been trying to elevate and move around/exercise much as possible but nonetheless I still think that the compression which he does have his compression socks today as well as lymphedema pumps would greatly benefit him as far as preventing future ulcerations/blisters that would lead to further expensive wound care visits. 10/25/2019 on evaluation today patient appears to be doing excellent in regard to his legs bilaterally. In fact there is only one small area on the posterior aspect of his left leg that appears to be an issue at this time. Fortunately there is no signs of active infection which is great news. No fevers, chills, nausea, vomiting, or diarrhea. I do believe that for all intents purposes this is completely healed I think it would be appropriate for [place] to make a referral to the lymphedema clinic for him at this point. Objective Constitutional Obese and well-hydrated in no acute distress. Vitals Time Taken: 2:25 PM, Height: 77 in, Weight: 265 lbs, BMI: 31.4, Temperature: 98.1 F, Pulse: 101 bpm, Respiratory Rate: 18 breaths/min, Blood Pressure: 153/101 mmHg. Respiratory normal breathing without  difficulty. Psychiatric this patient is able to make decisions and demonstrates good insight into disease process. Alert and Oriented x 3. pleasant and cooperative. SEABRON, IANNELLO ([id number]) General Notes: Upon inspection patient's wound bed actually showed signs of good granulation at this time. There does not appear to be any evidence of active infection and in fact he has pretty much all new epithelial growth noted at this time. Overall I feel like he is making great progress and again is much healed based on what I am seeing today. Integumentary (Hair, Skin) Wound #1 status is Open. Original cause of wound was Gradually Appeared. The wound is located on the Left,Circumferential Lower Leg. The wound measures 0.1cm length x 0.1cm width x 0.1cm depth; 0.008cm^2 area and 0.001cm^3 volume. There is Fat Layer (Subcutaneous Tissue) exposed. There is no tunneling or undermining noted. There is a large amount of purulent drainage noted. The wound margin is distinct with the outline attached to the wound base. There is no granulation within the wound bed. There is a large (67-100%) amount of necrotic tissue within the wound bed including Adherent Slough. 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 There was a Radio broadcast assistant Compression Therapy Procedure by Rodell Perna, RN. Post procedure Diagnosis Wound #1: Same as Pre-Procedure Plan Primary Wound Dressing: Wound #1 Left,Circumferential Lower Leg: Silver Alginate Secondary Dressing: Wound #1 Left,Circumferential Lower Leg: ABD pad Dressing  Change Frequency: Change dressing every  week Follow-up Appointments: Return Appointment in 1 week. Nurse Visit as needed Edema Control: Unna Boot to Left Lower Extremity Elevate legs to the level of the heart and pump ankles as often as possible Services and Therapies ordered were: Lymphedema Clinic - referral 1. I do believe that it would be a good idea for [place] to see about making referral to lymphedema clinic for the patient. I think he could benefit from the care they can provide. Especially now that he has no open wounds. 2. With regard to wrapping we will continue to wrap his left leg for 1 more week just to make sure everything is indeed completely and truly closed I do not want anything to show up on toward in the meantime. 3. I am also can recommend the patient continue to monitor for any signs of worsening infection. Obviously I think that right now the infection seems to be under good control but we will keep a close eye on this as well until he is discharged. We will see patient back for reevaluation in 1 week here in the clinic. If anything worsens or changes patient will contact our office for additional recommendations. ZAKYE, BABY ([id number]) Electronic Signature(s) Signed: 10/25/2019 3:49:21 PM By: Lenda Kelp PA-C Entered By: Lenda Kelp on 10/25/2019 15:49:21 ROMOND, PIPKINS ([id number]) -------------------------------------------------------------------------------- SuperBill Details Patient Name: Jeffrey Lin Date of Service: 10/25/2019 Medical Record Number: [id number] Patient Account Number: [id number] Date of Birth/Sex: [birth year] (44 y.o. M) Treating RN: Rodell Perna Primary Care Provider: Kerby Nora Other Clinician: Referring Provider: Kerby Nora Treating Provider/Extender: Jeffrey Lin, Theordore Cisnero Lin in Treatment: 5 Diagnosis Coding ICD-10 Codes Code Description I89.0 Lymphedema, not elsewhere classified L97.821 Non-pressure chronic ulcer of other part of left lower leg limited to  breakdown of skin L97.811 Non-pressure chronic ulcer of other part of right lower leg limited to breakdown of skin I10 Essential (primary) hypertension Facility Procedures CPT4 Code: [id number] Description: (Facility Use Only) 29580LT - APPLY UNNA BOOT LT Modifier: Quantity: 1 Physician Procedures CPT4 Code: [id number] Description: 99214 - WC PHYS LEVEL 4 - EST PT Modifier: Quantity: 1 CPT4 Code: Description: ICD-10 Diagnosis Description I89.0 Lymphedema, not elsewhere classified L97.821 Non-pressure chronic ulcer of other part of left lower leg limited to brea L97.811 Non-pressure chronic ulcer of other part of right lower leg limited to bre I10  Essential (primary) hypertension Modifier: kdown of skin akdown of skin Quantity: Electronic Signature(s) Signed: 10/25/2019 3:49:37 PM By: Lenda Kelp PA-C Entered By: Lenda Kelp on 10/25/2019 15:49:37

## 2019-10-27 NOTE — Progress Notes (Signed)
[name], [name] ([id number]) Visit Report for 10/25/2019 Arrival Information Details Patient Name: Jeffrey Lin, Jeffrey Lin Date of Service: 10/25/2019 2:15 PM Medical Record Number: [id number] Patient Account Number: [id number] Date of Birth/Sex: 06/09/1975 (44 y.o. M) Treating RN: Rodell Perna Primary Care Nitesh Pitstick: Kerby Nora Other Clinician: Referring Dayle Sherpa: Kerby Nora Treating Lola Lofaro/Extender: Linwood Dibbles, HOYT Weeks in Treatment: 5 Visit Information History Since Last Visit Added or deleted any medications: No Patient Arrived: Ambulatory Any new allergies or adverse reactions: No Arrival Time: 14:26 Had a fall or experienced change in No Accompanied By: mother activities of daily living that may affect Transfer Assistance: None risk of falls: Patient Identification Verified: Yes Signs or symptoms of abuse/neglect since last visito No Secondary Verification Process Completed: Yes Hospitalized since last visit: No Patient Requires Transmission-Based Precautions: No Implantable device outside of the clinic excluding No Patient Has Alerts: No cellular tissue based products placed in the center since last visit: Has Dressing in Place as Prescribed: Yes Has Compression in Place as Prescribed: Yes Pain Present Now: Yes Electronic Signature(s) Signed: 10/25/2019 3:55:24 PM By: Dayton Martes RCP, RRT, CHT Entered By: Dayton Martes on 10/25/2019 14:27:52 Jeffrey Lin ([id number]) -------------------------------------------------------------------------------- Compression Therapy Details Patient Name: Jeffrey Lin Date of Service: 10/25/2019 2:15 PM Medical Record Number: [id number] Patient Account Number: [id number] Date of Birth/Sex: Dec 31, 1975 (44 y.o. M) Treating RN: Rodell Perna Primary Care Domanique Huesman: Kerby Nora Other Clinician: Referring Nicolaos Mitrano: Kerby Nora Treating Marlo Goodrich/Extender: Linwood Dibbles, HOYT Weeks in Treatment:  5 Compression Therapy Performed for Wound Assessment: Wound #1 Left,Circumferential Lower Leg Performed By: Clinician Rodell Perna, RN Compression Type: Henriette Combs Post Procedure Diagnosis Same as Pre-procedure Electronic Signature(s) Signed: 10/27/2019 8:11:02 AM By: Rodell Perna Entered By: Rodell Perna on 10/25/2019 14:55:09 Jeffrey Lin ([id number]) -------------------------------------------------------------------------------- Encounter Discharge Information Details Patient Name: Jeffrey Lin Date of Service: 10/25/2019 2:15 PM Medical Record Number: [id number] Patient Account Number: [id number] Date of Birth/Sex: 1975-07-09 (44 y.o. M) Treating RN: Rodell Perna Primary Care Tashunda Vandezande: Kerby Nora Other Clinician: Referring Deandra Gadson: Kerby Nora Treating Jaimya Feliciano/Extender: Linwood Dibbles, HOYT Weeks in Treatment: 5 Encounter Discharge Information Items Discharge Condition: Stable Ambulatory Status: Ambulatory Discharge Destination: Home Transportation: Private Auto Accompanied By: family Schedule Follow-up Appointment: Yes Clinical Summary of Care: Electronic Signature(s) Signed: 10/27/2019 8:11:02 AM By: Rodell Perna Entered By: Rodell Perna on 10/25/2019 14:57:23 Jeffrey Lin ([id number]) -------------------------------------------------------------------------------- Lower Extremity Assessment Details Patient Name: Jeffrey Lin Date of Service: 10/25/2019 2:15 PM Medical Record Number: [id number] Patient Account Number: [id number] Date of Birth/Sex: [birth year] (44 y.o. M) Treating RN: Rodell Perna Primary Care Dondrea Clendenin: Kerby Nora Other Clinician: Referring Nasreen Goedecke: Kerby Nora Treating Evanne Matsunaga/Extender: Linwood Dibbles, HOYT Weeks in Treatment: 5 Edema Assessment Assessed: [Left: No] [Right: No] Edema: [Left: Ye] [Right: s] Calf Left: Right: Point of Measurement: 35 cm From Medial Instep 47 cm Ankle Left: Right: Point of Measurement: 10 cm From Medial  Instep 34 cm Vascular Assessment Pulses: Dorsalis Pedis Palpable: [Left:Yes] Electronic Signature(s) Signed: 10/27/2019 8:11:02 AM By: Rodell Perna Entered By: Rodell Perna on 10/25/2019 14:44:51 Jeffrey Lin, Jeffrey Lin ([id number]) -------------------------------------------------------------------------------- Multi Wound Chart Details Patient Name: Jeffrey Lin Date of Service: 10/25/2019 2:15 PM Medical Record Number: [id number] Patient Account Number: [id number] Date of Birth/Sex: [birth year] (44 y.o. M) Treating RN: Rodell Perna Primary Care Yohance Hathorne: Kerby Nora Other Clinician: Referring Amaris Garrette: Kerby Nora Treating Ryleigh Buenger/Extender: Linwood Dibbles, HOYT Weeks in Treatment: 5 Vital Signs Height(in): 77 Pulse(bpm): 101 Weight(lbs): 265 Blood Pressure(mmHg): 153/101 Body Mass Index(BMI): 31 Temperature(F):  98.1 Respiratory Rate(breaths/min): 18 Photos: [N/A:N/A] Wound Location: Left, Circumferential Lower Leg N/A N/A Wounding Event: Gradually Appeared N/A N/A Primary Etiology: Lymphedema N/A N/A Comorbid History: Hypertension N/A N/A Date Acquired: 04/22/2019 N/A N/A Weeks of Treatment: 4 N/A N/A Wound Status: Open N/A N/A Measurements L x W x D (cm) 0.1x0.1x0.1 N/A N/A Area (cm) : 0.008 N/A N/A Volume (cm) : 0.001 N/A N/A % Reduction in Area: 100.00% N/A N/A % Reduction in Volume: 100.00% N/A N/A Classification: Partial Thickness N/A N/A Exudate Amount: Large N/A N/A Exudate Type: Purulent N/A N/A Exudate Color: yellow, brown, green N/A N/A Wound Margin: Distinct, outline attached N/A N/A Granulation Amount: None Present (0%) N/A N/A Necrotic Amount: Large (67-100%) N/A N/A Exposed Structures: Fat Layer (Subcutaneous Tissue): N/A N/A Yes Fascia: No Tendon: No Muscle: No Joint: No Bone: No Epithelialization: Large (67-100%) N/A N/A Treatment Notes Jeffrey Lin, Jeffrey Lin ([id number]) Electronic Signature(s) Signed: 10/27/2019 8:11:02 AM By: Rodell Perna Entered  By: Rodell Perna on 10/25/2019 14:52:36 Jeffrey Lin ([id number]) -------------------------------------------------------------------------------- Multi-Disciplinary Care Plan Details Patient Name: Jeffrey Lin Date of Service: 10/25/2019 2:15 PM Medical Record Number: [id number] Patient Account Number: [id number] Date of Birth/Sex: 1975-09-20 (44 y.o. M) Treating RN: Rodell Perna Primary Care Armen Waring: Kerby Nora Other Clinician: Referring Nayellie Sanseverino: Kerby Nora Treating Jasiah Elsen/Extender: Linwood Dibbles, HOYT Weeks in Treatment: 5 Active Inactive Orientation to the Wound Care Program Nursing Diagnoses: Knowledge deficit related to the wound healing center program Goals: Patient/caregiver will verbalize understanding of the Wound Healing Center Program Date Initiated: 09/20/2019 Target Resolution Date: 09/24/2019 Goal Status: Active Interventions: Provide education on orientation to the wound center Notes: Wound/Skin Impairment Nursing Diagnoses: Impaired tissue integrity Knowledge deficit related to ulceration/compromised skin integrity Goals: Patient/caregiver will verbalize understanding of skin care regimen Date Initiated: 09/20/2019 Target Resolution Date: 10/01/2019 Goal Status: Active Interventions: Assess patient/caregiver ability to obtain necessary supplies Assess patient/caregiver ability to perform ulcer/skin care regimen upon admission and as needed Assess ulceration(s) every visit Provide education on ulcer and skin care Treatment Activities: Skin care regimen initiated : 09/20/2019 Topical wound management initiated : 09/20/2019 Notes: Electronic Signature(s) Signed: 10/27/2019 8:11:02 AM By: Rodell Perna Entered By: Rodell Perna on 10/25/2019 14:52:24 Jeffrey Lin ([id number]) -------------------------------------------------------------------------------- Pain Assessment Details Patient Name: Jeffrey Lin Date of Service: 10/25/2019 2:15  PM Medical Record Number: [id number] Patient Account Number: [id number] Date of Birth/Sex: 14-Feb-1975 (44 y.o. M) Treating RN: Rodell Perna Primary Care Sharyah Bostwick: Kerby Nora Other Clinician: Referring Patirica Longshore: Kerby Nora Treating Nitzia Perren/Extender: Linwood Dibbles, HOYT Weeks in Treatment: 5 Active Problems Location of Pain Severity and Description of Pain Patient Has Paino No Site Locations Pain Management and Medication Current Pain Management: Electronic Signature(s) Signed: 10/27/2019 8:11:02 AM By: Rodell Perna Entered By: Rodell Perna on 10/25/2019 14:38:41 Jeffrey Lin ([id number]) -------------------------------------------------------------------------------- Patient/Caregiver Education Details Patient Name: Jeffrey Lin Date of Service: 10/25/2019 2:15 PM Medical Record Number: [id number] Patient Account Number: [id number] Date of Birth/Gender: Mar 23, 1975 (44 y.o. M) Treating RN: Rodell Perna Primary Care Physician: Kerby Nora Other Clinician: Referring Physician: Kerby Nora Treating Physician/Extender: Skeet Simmer in Treatment: 5 Education Assessment Education Provided To: Patient Education Topics Provided Wound/Skin Impairment: Handouts: Caring for Your Ulcer Methods: Demonstration, Explain/Verbal Responses: State content correctly Electronic Signature(s) Signed: 10/27/2019 8:11:02 AM By: Rodell Perna Entered By: Rodell Perna on 10/25/2019 14:56:45 Jeffrey Lin ([id number]) -------------------------------------------------------------------------------- Wound Assessment Details Patient Name: Jeffrey Lin Date of Service: 10/25/2019 2:15 PM Medical Record Number: [id number] Patient Account Number: [id number] Date of  Birth/Sex: Jul 13, 1975 (44 y.o. M) Treating RN: Rodell Perna Primary Care Dal Blew: Kerby Nora Other Clinician: Referring Lennart Gladish: Kerby Nora Treating Layna Roeper/Extender: Linwood Dibbles, HOYT Weeks in Treatment: 5 Wound  Status Wound Number: 1 Primary Etiology: Lymphedema Wound Location: Left, Circumferential Lower Leg Wound Status: Open Wounding Event: Gradually Appeared Comorbid History: Hypertension Date Acquired: 04/22/2019 Weeks Of Treatment: 4 Clustered Wound: No Photos Wound Measurements Length: (cm) 0.1 % R Width: (cm) 0.1 % R Depth: (cm) 0.1 Epi Area: (cm) 0.008 Tu Volume: (cm) 0.001 Un eduction in Area: 100% eduction in Volume: 100% thelialization: Large (67-100%) nneling: No dermining: No Wound Description Classification: Partial Thickness Wound Margin: Distinct, outline attached Exudate Amount: Large Exudate Type: Purulent Exudate Color: yellow, brown, green Foul Odor After Cleansing: No Slough/Fibrino Yes Wound Bed Granulation Amount: None Present (0%) Exposed Structure Necrotic Amount: Large (67-100%) Fascia Exposed: No Necrotic Quality: Adherent Slough Fat Layer (Subcutaneous Tissue) Exposed: Yes Tendon Exposed: No Muscle Exposed: No Joint Exposed: No Bone Exposed: No Treatment Notes Wound #1 (Left, Circumferential Lower Leg) Notes scell, xsorb, unna boot left Electronic Signature(s) Signed: 10/27/2019 8:11:02 AM By: Sloan Leiter ([id number]) Entered By: Rodell Perna on 10/25/2019 14:44:15 Jeffrey Lin ([id number]) -------------------------------------------------------------------------------- Vitals Details Patient Name: Jeffrey Lin Date of Service: 10/25/2019 2:15 PM Medical Record Number: [id number] Patient Account Number: [id number] Date of Birth/Sex: September 26, 1975 (44 y.o. M) Treating RN: Rodell Perna Primary Care Reinaldo Helt: Kerby Nora Other Clinician: Referring Paddy Neis: Kerby Nora Treating Corney Knighton/Extender: Linwood Dibbles, HOYT Weeks in Treatment: 5 Vital Signs Time Taken: 14:25 Temperature (F): 98.1 Height (in): 77 Pulse (bpm): 101 Weight (lbs): 265 Respiratory Rate (breaths/min): 18 Body Mass Index (BMI): 31.4 Blood  Pressure (mmHg): 153/101 Reference Range: 80 - 120 mg / dl Electronic Signature(s) Signed: 10/25/2019 3:55:24 PM By: Dayton Martes RCP, RRT, CHT Entered By: Dayton Martes on 10/25/2019 14:30:28

## 2019-10-28 ENCOUNTER — Other Ambulatory Visit: Payer: Self-pay | Admitting: Family Medicine

## 2019-10-28 NOTE — Telephone Encounter (Signed)
Last office visit 09/09/2019 Ulcers of both lower legs.  Last refilled 08/25/2019 for #[id number] with no refills.  No future appointments with PCP

## 2019-11-02 ENCOUNTER — Other Ambulatory Visit: Payer: Self-pay

## 2019-11-02 ENCOUNTER — Encounter: Payer: PPO | Admitting: Physician Assistant

## 2019-11-02 DIAGNOSIS — L97811 Non-pressure chronic ulcer of other part of right lower leg limited to breakdown of skin: Secondary | ICD-10-CM | POA: Diagnosis not present

## 2019-11-02 DIAGNOSIS — L97821 Non-pressure chronic ulcer of other part of left lower leg limited to breakdown of skin: Secondary | ICD-10-CM | POA: Diagnosis not present

## 2019-11-02 DIAGNOSIS — I890 Lymphedema, not elsewhere classified: Secondary | ICD-10-CM | POA: Diagnosis not present

## 2019-11-02 NOTE — Progress Notes (Addendum)
ULIS, KAPS ([id number]) Visit Report for 11/02/2019 Arrival Information Details Patient Name: Jeffrey Lin, Jeffrey Lin Date of Service: 11/02/2019 8:15 AM Medical Record Number: [id number] Patient Account Number: [id number] Date of Birth/Sex: 1975/06/24 (44 y.o. M) Treating RN: Huel Coventry Primary Care Elmira Olkowski: Kerby Nora Other Clinician: Referring Layken Beg: Kerby Nora Treating Leonidus Rowand/Extender: Linwood Dibbles, HOYT Weeks in Treatment: 6 Visit Information History Since Last Visit Added or deleted any medications: No Patient Arrived: Ambulatory Has Dressing in Place as Prescribed: Yes Arrival Time: 08:24 Has Compression in Place as Prescribed: Yes Accompanied By: self Pain Present Now: No Transfer Assistance: None Patient Identification Verified: Yes Secondary Verification Process Completed: Yes Patient Requires Transmission-Based Precautions: No Patient Has Alerts: No Electronic Signature(s) Signed: 11/02/2019 4:42:00 PM By: Elliot Gurney, BSN, RN, CWS, Kim RN, BSN Entered By: Elliot Gurney, BSN, RN, CWS, Kim on 11/02/2019 08:25:05 Jeffrey Lin ([id number]) -------------------------------------------------------------------------------- Clinic Level of Care Assessment Details Patient Name: Jeffrey Lin Date of Service: 11/02/2019 8:15 AM Medical Record Number: [id number] Patient Account Number: [id number] Date of Birth/Sex: [birth year] (44 y.o. M) Treating RN: Huel Coventry Primary Care Alexios Keown: Kerby Nora Other Clinician: Referring Levonia Wolfley: Kerby Nora Treating Shanieka Blea/Extender: Linwood Dibbles, HOYT Weeks in Treatment: 6 Clinic Level of Care Assessment Items TOOL 4 Quantity Score []  - Use when only an EandM is performed on FOLLOW-UP visit 0 ASSESSMENTS - Nursing Assessment / Reassessment X - Reassessment of Co-morbidities (includes updates in patient status) 1 10 X- 1 5 Reassessment of Adherence to Treatment Plan ASSESSMENTS - Wound and Skin Assessment / Reassessment X - Simple Wound  Assessment / Reassessment - one wound 1 5 []  - 0 Complex Wound Assessment / Reassessment - multiple wounds []  - 0 Dermatologic / Skin Assessment (not related to wound area) ASSESSMENTS - Focused Assessment []  - Circumferential Edema Measurements - multi extremities 0 []  - 0 Nutritional Assessment / Counseling / Intervention []  - 0 Lower Extremity Assessment (monofilament, tuning fork, pulses) []  - 0 Peripheral Arterial Disease Assessment (using hand held doppler) ASSESSMENTS - Ostomy and/or Continence Assessment and Care []  - Incontinence Assessment and Management 0 []  - 0 Ostomy Care Assessment and Management (repouching, etc.) PROCESS - Coordination of Care X - Simple Patient / Family Education for ongoing care 1 15 []  - 0 Complex (extensive) Patient / Family Education for ongoing care []  - 0 Staff obtains , Records, Test Results / Process Orders []  - 0 Staff telephones HHA, Nursing Homes / Clarify orders / etc []  - 0 Routine Transfer to another Facility (non-emergent condition) []  - 0 Routine Hospital Admission (non-emergent condition) []  - 0 New Admissions / / Ordering NPWT, Apligraf, etc. []  - 0 Emergency Hospital Admission (emergent condition) X- 1 10 Simple Discharge Coordination []  - 0 Complex (extensive) Discharge Coordination PROCESS - Special Needs []  - Pediatric / Minor Patient Management 0 []  - 0 Isolation Patient Management []  - 0 Hearing / Language / Visual special needs []  - 0 Assessment of Community assistance (transportation, D/C planning, etc.) []  - 0 Additional assistance / Altered mentation []  - 0 Support Surface(s) Assessment (bed, cushion, seat, etc.) INTERVENTIONS - Wound Cleansing / Measurement Noy, Duston G. ( ) X- 1 5 Simple Wound Cleansing - one wound []  - 0 Complex Wound Cleansing - multiple wounds X- 1 5 Wound Imaging (photographs - any number of wounds) []  - 0 Wound Tracing (instead of  photographs) X- 1 5 Simple Wound Measurement - one wound []  - 0 Complex Wound Measurement - multiple wounds INTERVENTIONS - Wound  Dressings []  - Small Wound Dressing one or multiple wounds 0 []  - 0 Medium Wound Dressing one or multiple wounds []  - 0 Large Wound Dressing one or multiple wounds []  - 0 Application of Medications - topical []  - 0 Application of Medications - injection INTERVENTIONS - Miscellaneous []  - External ear exam 0 []  - 0 Specimen Collection (cultures, biopsies, blood, body fluids, etc.) []  - 0 Specimen(s) / Culture(s) sent or taken to Lab for analysis []  - 0 Patient Transfer (multiple staff / / Similar devices) []  - 0 Simple Staple / Suture removal (25 or less) []  - 0 Complex Staple / Suture removal (26 or more) []  - 0 Hypo / Hyperglycemic Management (close monitor of Blood Glucose) []  - 0 Ankle / Brachial Index (ABI) - do not check if billed separately X- 1 5 Vital Signs Has the patient been seen at the hospital within the last three years: Yes Total Score: 65 Level Of Care: New/Established - Level 2 Electronic Signature(s) Signed: 11/02/2019 4:42:00 PM By: , BSN, RN, CWS, Kim RN, BSN Entered By: , BSN, RN, CWS, Kim on 11/02/2019 08:58:36 ( ) -------------------------------------------------------------------------------- Encounter Discharge Information Details Patient Name: Date of Service: 11/02/2019 8:15 AM Medical Record Number: Nurse, adult Patient Account Number: Date of Birth/Sex: [birth year] (44 y.o. M) Treating RN: Primary Care Marl Seago: 01/02/2020 Other Clinician: Referring Brei Pociask: Elliot Gurney Treating Kailiana Granquist/Extender: Elliot Gurney, HOYT Weeks in Treatment: 6 Encounter Discharge Information Items Discharge Condition: Stable Ambulatory Status: Ambulatory Discharge Destination: Home Transportation: Private Auto Accompanied By: self Schedule  Follow-up Appointment: No Clinical Summary of Care: Electronic Signature(s) Signed: 11/02/2019 4:42:00 PM By: Jeffrey Lin, BSN, RN, CWS, Kim RN, BSN Entered By: [id number], BSN, RN, CWS, Kim on 11/02/2019 08:59:33 01/02/2020 ([id number]) -------------------------------------------------------------------------------- Lower Extremity Assessment Details Patient Name: [id number] Date of Service: 11/02/2019 8:15 AM Medical Record Number: [id number] Patient Account Number: Huel Coventry Date of Birth/Sex: [birth year] (44 y.o. M) Treating RN: Linwood Dibbles Primary Care Auri Jahnke: 01/02/2020 Other Clinician: Referring Dathan Attia: Elliot Gurney Treating Javontay Vandam/Extender: Elliot Gurney, HOYT Weeks in Treatment: 6 Edema Assessment Assessed: [Left: No] [Right: No] [Left: Edema] [Right: :] Calf Left: Right: Point of Measurement: 35 cm From Medial Instep 45.3 cm Ankle Left: Right: Point of Measurement: 10 cm From Medial Instep 35.5 cm Vascular Assessment Pulses: Dorsalis Pedis Palpable: [Left:Yes] Notes Patient has +2 pitting edema Electronic Signature(s) Signed: 11/02/2019 4:42:00 PM By: Jeffrey Lin, BSN, RN, CWS, Kim RN, BSN Entered By: [id number], BSN, RN, CWS, Kim on 11/02/2019 08:37:39 01/02/2020 ([id number]) -------------------------------------------------------------------------------- Multi Wound Chart Details Patient Name: [id number] Date of Service: 11/02/2019 8:15 AM Medical Record Number: [id number] Patient Account Number: Huel Coventry Date of Birth/Sex: 1975/06/04 (44 y.o. M) Treating RN: Linwood Dibbles Primary Care Ricke Kimoto: 01/02/2020 Other Clinician: Referring Amayia Ciano: Elliot Gurney Treating Travis Purk/Extender: Elliot Gurney, HOYT Weeks in Treatment: 6 Vital Signs Height(in): 77 Pulse(bpm): 107 Weight(lbs): 265 Blood Pressure(mmHg): 145/94 Body Mass Index(BMI): 31 Temperature(F): 98.5 Respiratory Rate(breaths/min): 18 Photos: [1:No Photos] [N/A:N/A] Wound Location: [1:Left,  Circumferential Lower Leg] [N/A:N/A] Wounding Event: [1:Gradually Appeared] [N/A:N/A] Primary Etiology: [1:Lymphedema] [N/A:N/A] Comorbid History: [1:Hypertension] [N/A:N/A] Date Acquired: [1:04/22/2019] [N/A:N/A] Weeks of Treatment: [1:5] [N/A:N/A] Wound Status: [1:Open] [N/A:N/A] Measurements L x W x D (cm) [1:0.1x0.1x0.1] [N/A:N/A] Area (cm) : [1:0.008] [N/A:N/A] Volume (cm) : [1:0.001] [N/A:N/A] % Reduction in Area: [1:100.00%] [N/A:N/A] % Reduction in Volume: [1:100.00%] [N/A:N/A] Classification: [1:Partial Thickness] [N/A:N/A] Exudate Amount: [1:None Present] [N/A:N/A] Wound Margin: [1:Distinct, outline  attached] [N/A:N/A] Granulation Amount: [1:None Present (0%)] [N/A:N/A] Necrotic Amount: [1:Large (67-100%)] [N/A:N/A] Necrotic Tissue: [1:Eschar] [N/A:N/A] Exposed Structures: [1:Fascia: No Fat Layer (Subcutaneous Tissue): No Tendon: No Muscle: No Joint: No Bone: No Large (67-100%)] [N/A:N/A N/A] Treatment Notes Electronic Signature(s) Signed: 11/02/2019 4:42:00 PM By: Elliot GurneyWoody, BSN, RN, CWS, Kim RN, BSN Entered By: Elliot GurneyWoody, BSN, RN, CWS, Kim on 11/02/2019 08:57:07 Jeffrey GillisSTEWART, Toddrick G. ([id number]) -------------------------------------------------------------------------------- Multi-Disciplinary Care Plan Details Patient Name: Jeffrey GillisSTEWART, Marcos G. Date of Service: 11/02/2019 8:15 AM Medical Record Number: [id number] Patient Account Number: [id number] Date of Birth/Sex: 07/14/1975 (44 y.o. M) Treating RN: Huel CoventryWoody, Kim Primary Care Phoebie Shad: Kerby NoraBedsole, Amy Other Clinician: Referring Dennys Traughber: Kerby NoraBedsole, Amy Treating Evanee Lubrano/Extender: Skeet SimmerSTONE III, HOYT Weeks in Treatment: 6 Active Inactive Electronic Signature(s) Signed: 11/08/2019 5:43:59 PM By: Elliot GurneyWoody, BSN, RN, CWS, Kim RN, BSN Previous Signature: 11/02/2019 4:42:00 PM Version By: Elliot GurneyWoody, BSN, RN, CWS, Kim RN, BSN Entered By: Elliot GurneyWoody, BSN, RN, CWS, Kim on 11/08/2019 17:43:59 Jeffrey GillisSTEWART, Vin G.  ([id number]) -------------------------------------------------------------------------------- Pain Assessment Details Patient Name: Jeffrey GillisSTEWART, Shota G. Date of Service: 11/02/2019 8:15 AM Medical Record Number: [id number] Patient Account Number: [id number] Date of Birth/Sex: 04/17/1975 (44 y.o. M) Treating RN: Huel CoventryWoody, Kim Primary Care Zackaria Burkey: Kerby NoraBedsole, Amy Other Clinician: Referring Caniyah Murley: Kerby NoraBedsole, Amy Treating Darnita Woodrum/Extender: Linwood DibblesSTONE III, HOYT Weeks in Treatment: 6 Active Problems Location of Pain Severity and Description of Pain Patient Has Paino Yes Site Locations Pain Location: Generalized Pain Rate the pain. Current Pain Level: 2 Character of Pain Describe the Pain: Aching Pain Management and Medication Current Pain Management: Notes Feet hurt Electronic Signature(s) Signed: 11/02/2019 4:42:00 PM By: Elliot GurneyWoody, BSN, RN, CWS, Kim RN, BSN Entered By: Elliot GurneyWoody, BSN, RN, CWS, Kim on 11/02/2019 08:35:36 Jeffrey GillisSTEWART, Nimai G. ([id number]) -------------------------------------------------------------------------------- Patient/Caregiver Education Details Patient Name: Jeffrey GillisSTEWART, Bernabe G. Date of Service: 11/02/2019 8:15 AM Medical Record Number: [id number] Patient Account Number: [id number] Date of Birth/Gender: 04/29/1975 ([age] y.o. M) Treating RN: Huel CoventryWoody, Kim Primary Care Physician: Kerby NoraBedsole, Amy Other Clinician: Referring Physician: Kerby NoraBedsole, Amy Treating Physician/Extender: Skeet SimmerSTONE III, HOYT Weeks in Treatment: 6 Education Assessment Education Provided To: Patient Education Topics Provided Venous: Controlling Swelling with Compression Stockings , Other: Elevate legs when sitting; wear stockings all day everyday to control Handouts: swellling Methods: Demonstration, Explain/Verbal Responses: State content correctly Electronic Signature(s) Signed: 11/02/2019 4:42:00 PM By: Elliot GurneyWoody, BSN, RN, CWS, Kim RN, BSN Entered By: Elliot GurneyWoody, BSN, RN, CWS, Kim on 11/02/2019 09:01:16 Jeffrey GillisSTEWART, Robyn G.  ([id number]) -------------------------------------------------------------------------------- Wound Assessment Details Patient Name: Jeffrey GillisSTEWART, Caius G. Date of Service: 11/02/2019 8:15 AM Medical Record Number: [id number] Patient Account Number: [id number] Date of Birth/Sex: 04/22/1975 (44 y.o. M) Treating RN: Huel CoventryWoody, Kim Primary Care Shelly Shoultz: Kerby NoraBedsole, Amy Other Clinician: Referring Simone Tuckey: Kerby NoraBedsole, Amy Treating Kelsa Jaworowski/Extender: Linwood DibblesSTONE III, HOYT Weeks in Treatment: 6 Wound Status Wound Number: 1 Primary Etiology: Lymphedema Wound Location: Left, Circumferential Lower Leg Wound Status: Open Wounding Event: Gradually Appeared Comorbid History: Hypertension Date Acquired: 04/22/2019 Weeks Of Treatment: 5 Clustered Wound: No Photos Wound Measurements Length: (cm) 0.1 Width: (cm) 0.1 Depth: (cm) 0.1 Area: (cm) 0.008 Volume: (cm) 0.001 % Reduction in Area: 100% % Reduction in Volume: 100% Epithelialization: Large (67-100%) Wound Description Classification: Partial Thickness Wound Margin: Distinct, outline attached Exudate Amount: None Present Foul Odor After Cleansing: No Slough/Fibrino No Wound Bed Granulation Amount: None Present (0%) Exposed Structure Necrotic Amount: Large (67-100%) Fascia Exposed: No Necrotic Quality: Eschar Fat Layer (Subcutaneous Tissue) Exposed: No Tendon Exposed: No Muscle Exposed: No Joint Exposed: No Bone Exposed: No Electronic Signature(s) Signed: 11/02/2019 9:14:53 AM By: Rodell PernaScott, Dajea  Signed: 11/02/2019 4:42:00 PM By: Elliot Gurney, BSN, RN, CWS, Kim RN, BSN Entered By: Rodell Perna on 11/02/2019 09:14:53 DALVIN, CLIPPER ([id number]) -------------------------------------------------------------------------------- Vitals Details Patient Name: Jeffrey Lin Date of Service: 11/02/2019 8:15 AM Medical Record Number: [id number] Patient Account Number: [id number] Date of Birth/Sex: 08-06-1975 (44 y.o. M) Treating RN: Huel Coventry Primary Care  Nayla Dias: Kerby Nora Other Clinician: Referring Kaia Depaolis: Kerby Nora Treating Jahkeem Kurka/Extender: Linwood Dibbles, HOYT Weeks in Treatment: 6 Vital Signs Time Taken: 08:25 Temperature (F): 98.5 Height (in): 77 Pulse (bpm): 107 Weight (lbs): 265 Respiratory Rate (breaths/min): 18 Body Mass Index (BMI): 31.4 Blood Pressure (mmHg): 145/94 Reference Range: 80 - 120 mg / dl Electronic Signature(s) Signed: 11/02/2019 4:42:00 PM By: Elliot Gurney, BSN, RN, CWS, Kim RN, BSN Entered By: Elliot Gurney, BSN, RN, CWS, Kim on 11/02/2019 08:27:36

## 2019-11-02 NOTE — Progress Notes (Addendum)
Jeffrey Lin, Jeffrey Lin ([id number]) Visit Report for 11/02/2019 Chief Complaint Document Details Patient Name: Jeffrey Lin, Jeffrey Lin Date of Service: 11/02/2019 8:15 AM Medical Record Number: [id number] Patient Account Number: [id number] Date of Birth/Sex: [birth year] (44 y.o. M) Treating RN: Jeffrey Lin Primary Care Provider: Kerby Lin Other Clinician: Referring Provider: Kerby Lin Treating Provider/Extender: Jeffrey Lin, Jeffrey Lin in Treatment: 6 Information Obtained from: Patient Chief Complaint Bilateral LE Lymphedema Electronic Signature(s) Signed: 11/02/2019 8:33:04 AM By: Jeffrey Kelp PA-C Entered By: Jeffrey Lin on 11/02/2019 08:33:03 Jeffrey Lin, Jeffrey Lin ([id number]) -------------------------------------------------------------------------------- HPI Details Patient Name: Jeffrey Lin Date of Service: 11/02/2019 8:15 AM Medical Record Number: [id number] Patient Account Number: [id number] Date of Birth/Sex: Jun 05, 1975 (44 y.o. M) Treating RN: Jeffrey Lin Primary Care Provider: Kerby Lin Other Clinician: Referring Provider: Kerby Lin Treating Provider/Extender: Jeffrey Lin, Jeffrey Lin Lin in Treatment: 6 History of Present Illness HPI Description: 09/20/2019 upon evaluation today patient presents for initial evaluation in our clinic concerning issues that he has been having with wounds over the bilateral lower extremities. Both legs seem to be fairly equally affected at this point based on what I am seeing. With that being said the patient also appears to be having some drainage which is somewhat blue/green in nature and does have me concerned about the possibility of infection. I explained this often can indicate a Pseudomonas infection despite the fact that the patient has been on antibiotics currently this could still be problematic. He currently does not have home health that is something that we did discussed the potential for looking into. I think that could be beneficial  for him to be honest. The patient seems somewhat frustrated with the situation in general. He does have a history of lymphedema, hypertension, and he does ambulate and walk though not a lot and tells me that he really does not drive unless is absolutely forced for something he absolutely has to do otherwise he is essentially homebound at this point. He did have a believe his mother with him today. 09/28/2019 on evaluation today patient appears to be doing better in regard to his leg ulcers at this point. The antibiotic does seem to be helping and overall he tells me that his pain is less though he still is having discomfort. Fortunately there is no signs of active infection at this time. 10/04/2019 upon evaluation today patient appears to be doing well at this point in regard to his wounds. He in fact is improving dramatically from the standpoint of his infection which I think is under great control. I see no evidence of infection systemically at this point and overall I am extremely pleased I think he is getting close to resolution to be honest though he still has several scattered areas at this point. 10/12/2019 upon evaluation today patient actually seems to be making excellent progress in regard to his leg ulcers at this point. He just has a small area on the left posterior and a small area on the right anterior leg that are still open. Everything else has completely resolved and seems to have done excellent up to this point. 10/19/2019 upon evaluation today patient actually appears to be doing much better on the right leg with regard to the open wounds in fact there is nothing remaining at this point which is great news. Overall I am very pleased with where things stand. I do believe that the compression has helped he still has edema which is quite significant I still believe lymphedema pumps would be  beneficial for him. With regard to the left leg he still has again the edema and this is nonetheless  quite significant despite compression therapy but still I do believe that he is doing much better with the compression I do believe again lymphedema pumps would be of benefit in this regard as well. He has been trying to elevate and move around/exercise much as possible but nonetheless I still think that the compression which he does have his compression socks today as well as lymphedema pumps would greatly benefit him as far as preventing future ulcerations/blisters that would lead to further expensive wound care visits. 10/25/2019 on evaluation today patient appears to be doing excellent in regard to his legs bilaterally. In fact there is only one small area on the posterior aspect of his left leg that appears to be an issue at this time. Fortunately there is no signs of active infection which is great news. No fevers, chills, nausea, vomiting, or diarrhea. I do believe that for all intents purposes this is completely healed I think it would be appropriate for [place] to make a referral to the lymphedema clinic for him at this point. 11/02/2019 on evaluation today patient appears to be doing well with regard to his legs bilaterally. I do not see anything that remains open at this point he still has swelling but he does have new compression socks that he got as well. Fortunately there is no signs of active infection at this time which is great news. We did make a referral to lymphedema clinic he has not heard from them yet but I did recommend him giving them a call and we can give him the number for that today. Electronic Signature(s) Signed: 11/02/2019 9:20:41 AM By: Jeffrey Kelp PA-C Entered By: Jeffrey Lin on 11/02/2019 09:20:40 Jeffrey Lin ([id number]) -------------------------------------------------------------------------------- Physical Exam Details Patient Name: Jeffrey Lin Date of Service: 11/02/2019 8:15 AM Medical Record Number: [id number] Patient Account Number:  [id number] Date of Birth/Sex: 09-07-1975 (44 y.o. M) Treating RN: Jeffrey Lin Primary Care Provider: Kerby Lin Other Clinician: Referring Provider: Kerby Lin Treating Provider/Extender: Jeffrey Lin, Lita Flynn Lin in Treatment: 6 Constitutional Well-nourished and well-hydrated in no acute distress. Respiratory normal breathing without difficulty. Psychiatric this patient is able to make decisions and demonstrates good insight into disease process. Alert and Oriented x 3. pleasant and cooperative. Notes On inspection patient's wound bed actually showed signs of good granulation at this time. There does not appear to be any evidence of active infection which is great news and overall I am very pleased with where things stand. No fevers, chills, nausea, vomiting, or diarrhea. Electronic Signature(s) Signed: 11/02/2019 9:21:01 AM By: Jeffrey Kelp PA-C Entered By: Jeffrey Lin on 11/02/2019 09:21:00 Jeffrey Lin, Jeffrey Lin ([id number]) -------------------------------------------------------------------------------- Physician Orders Details Patient Name: Jeffrey Lin Date of Service: 11/02/2019 8:15 AM Medical Record Number: [id number] Patient Account Number: [id number] Date of Birth/Sex: 08/16/1975 (44 y.o. M) Treating RN: Jeffrey Lin Primary Care Provider: Kerby Lin Other Clinician: Referring Provider: Kerby Lin Treating Provider/Extender: Jeffrey Lin, Tabytha Gradillas Lin in Treatment: 6 Verbal / Phone Orders: No Diagnosis Coding ICD-10 Coding Code Description I89.0 Lymphedema, not elsewhere classified L97.821 Non-pressure chronic ulcer of other part of left lower leg limited to breakdown of skin L97.811 Non-pressure chronic ulcer of other part of right lower leg limited to breakdown of skin I10 Essential (primary) hypertension Edema Control Wound #1 Left,Circumferential Lower Leg o Patient to wear own compression stockings - Applied bilateral compression stockings.  [facility] Wound #1 Left,Circumferential Lower Leg o Discharge from [facility] Center - Treatment complete Electronic Signature(s) Signed: 11/02/2019 4:23:41 PM By: Jeffrey KelpStone III, Rosario Duey PA-C Signed: 11/02/2019 4:42:00 PM By: Elliot GurneyWoody, BSN, RN, CWS, Kim RN, BSN Entered By: Elliot GurneyWoody, BSN, RN, CWS, Kim on 11/02/2019 09:00:22 Jeffrey GillisSTEWART, Jeffrey G. ([id number]) -------------------------------------------------------------------------------- Problem List Details Patient Name: Jeffrey GillisSTEWART, Malak G. Date of Service: 11/02/2019 8:15 AM Medical Record Number: [id number] Patient Account Number: [id number] Date of Birth/Sex: 12/03/1975 (44 y.o. M) Treating RN: Jeffrey CoventryWoody, Kim Primary Care Provider: Kerby NoraBedsole, Amy Other Clinician: Referring Provider: Kerby NoraBedsole, Amy Treating Provider/Extender: Jeffrey DibblesSTONE III, Ginni Eichler Lin in Treatment: 6 Active Problems ICD-10 Encounter Code Description Active Date MDM Diagnosis I89.0 Lymphedema, not elsewhere classified 09/20/2019 No Yes L97.821 Non-pressure chronic ulcer of other part of left lower leg limited to 09/20/2019 No Yes breakdown of skin L97.811 Non-pressure chronic ulcer of other part of right lower leg limited to 09/20/2019 No Yes breakdown of skin I10 Essential (primary) hypertension 09/20/2019 No Yes Inactive Problems Resolved Problems Electronic Signature(s) Signed: 11/02/2019 8:32:53 AM By: Jeffrey KelpStone III, Brice Kossman PA-C Entered By: Jeffrey KelpStone III, Loni Delbridge on 11/02/2019 08:32:53 Jeffrey GillisSTEWART, Beryle G. ([id number]) -------------------------------------------------------------------------------- Progress Note Details Patient Name: Jeffrey GillisSTEWART, Jayshaun G. Date of Service: 11/02/2019 8:15 AM Medical Record Number: [id number] Patient Account Number: [id number] Date of Birth/Sex: 12/11/1975 (44 y.o. M) Treating RN: Jeffrey CoventryWoody, Kim Primary Care Provider: Kerby NoraBedsole, Amy Other Clinician: Referring Provider: Kerby NoraBedsole, Amy Treating Provider/Extender: Jeffrey DibblesSTONE III, Devone Tousley Lin in Treatment: 6 Subjective Chief  Complaint Information obtained from Patient Bilateral LE Lymphedema History of Present Illness (HPI) 09/20/2019 upon evaluation today patient presents for initial evaluation in our clinic concerning issues that he has been having with wounds over the bilateral lower extremities. Both legs seem to be fairly equally affected at this point based on what I am seeing. With that being said the patient also appears to be having some drainage which is somewhat blue/green in nature and does have me concerned about the possibility of infection. I explained this often can indicate a Pseudomonas infection despite the fact that the patient has been on antibiotics currently this could still be problematic. He currently does not have home health that is something that we did discussed the potential for looking into. I think that could be beneficial for him to be honest. The patient seems somewhat frustrated with the situation in general. He does have a history of lymphedema, hypertension, and he does ambulate and walk though not a lot and tells me that he really does not drive unless is absolutely forced for something he absolutely has to do otherwise he is essentially homebound at this point. He did have a believe his mother with him today. 09/28/2019 on evaluation today patient appears to be doing better in regard to his leg ulcers at this point. The antibiotic does seem to be helping and overall he tells me that his pain is less though he still is having discomfort. Fortunately there is no signs of active infection at this time. 10/04/2019 upon evaluation today patient appears to be doing well at this point in regard to his wounds. He in fact is improving dramatically from the standpoint of his infection which I think is under great control. I see no evidence of infection systemically at this point and overall I am extremely pleased I think he is getting close to resolution to be honest though he still has several  scattered areas at this point. 10/12/2019 upon evaluation today patient actually seems to be  making excellent progress in regard to his leg ulcers at this point. He just has a small area on the left posterior and a small area on the right anterior leg that are still open. Everything else has completely resolved and seems to have done excellent up to this point. 10/19/2019 upon evaluation today patient actually appears to be doing much better on the right leg with regard to the open wounds in fact there is nothing remaining at this point which is great news. Overall I am very pleased with where things stand. I do believe that the compression has helped he still has edema which is quite significant I still believe lymphedema pumps would be beneficial for him. With regard to the left leg he still has again the edema and this is nonetheless quite significant despite compression therapy but still I do believe that he is doing much better with the compression I do believe again lymphedema pumps would be of benefit in this regard as well. He has been trying to elevate and move around/exercise much as possible but nonetheless I still think that the compression which he does have his compression socks today as well as lymphedema pumps would greatly benefit him as far as preventing future ulcerations/blisters that would lead to further expensive wound care visits. 10/25/2019 on evaluation today patient appears to be doing excellent in regard to his legs bilaterally. In fact there is only one small area on the posterior aspect of his left leg that appears to be an issue at this time. Fortunately there is no signs of active infection which is great news. No fevers, chills, nausea, vomiting, or diarrhea. I do believe that for all intents purposes this is completely healed I think it would be appropriate for [place] to make a referral to the lymphedema clinic for him at this point. 11/02/2019 on evaluation today patient  appears to be doing well with regard to his legs bilaterally. I do not see anything that remains open at this point he still has swelling but he does have new compression socks that he got as well. Fortunately there is no signs of active infection at this time which is great news. We did make a referral to lymphedema clinic he has not heard from them yet but I did recommend him giving them a call and we can give him the number for that today. Objective Constitutional Well-nourished and well-hydrated in no acute distress. Vitals Time Taken: 8:25 AM, Height: 77 in, Weight: 265 lbs, BMI: 31.4, Temperature: 98.5 F, Pulse: 107 bpm, Respiratory Rate: 18 breaths/min, Blood Pressure: 145/94 mmHg. Respiratory normal breathing without difficulty. Jeffrey Lin, Jeffrey Lin ([id number]) Psychiatric this patient is able to make decisions and demonstrates good insight into disease process. Alert and Oriented x 3. pleasant and cooperative. General Notes: On inspection patient's wound bed actually showed signs of good granulation at this time. There does not appear to be any evidence of active infection which is great news and overall I am very pleased with where things stand. No fevers, chills, nausea, vomiting, or diarrhea. Integumentary (Hair, Skin) Wound #1 status is Open. Original cause of wound was Gradually Appeared. The wound is located on the Left,Circumferential Lower Leg. The wound measures 0.1cm length x 0.1cm width x 0.1cm depth; 0.008cm^2 area and 0.001cm^3 volume. There is a none present amount of drainage noted. The wound margin is distinct with the outline attached to the wound base. There is no granulation within the wound bed. There is a large (67-100%) amount  of necrotic tissue within the wound bed including Eschar. 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 [facility]charge [facility] Services: Wound #1 Left,Circumferential Lower Leg: Discharge from Wound Care Center - Treatment complete 1. I would recommend currently that the patient go ahead and continue with the wound care measures as before as far as compression is concerned he does have compression socks. Obviously does not need any dressing supplies as there is nothing open at this point. 2. I am also can recommend that the patient should go to lymphedema clinic as soon as possible to try to help with edema control. 3. I am also can recommend the lymphedema pumps obviously his insurance is still requesting further information for approval although this is being managed more by the patient's cardiologist I did support documentation that I sent to them as well. Hopefully that will help in getting these approved. We will see the patient back for follow-up visit as needed. Electronic Signature(s) Signed: 11/02/2019 9:22:09 AM By: Jeffrey Kelp PA-C Entered By: Jeffrey Lin on 11/02/2019 09:22:09 Jeffrey Lin, Jeffrey Lin ([id number]) -------------------------------------------------------------------------------- SuperBill Details Patient Name: Jeffrey Lin Date of Service: 11/02/2019 Medical Record Number: [id number] Patient Account Number: [id number] Date of Birth/Sex: 1975-08-27 (44 y.o. M) Treating RN: Jeffrey Lin Primary Care Provider: Kerby Lin Other Clinician: Referring Provider: Kerby Lin Treating Provider/Extender: Jeffrey Lin, Burgess Sheriff Lin in Treatment: 6 Diagnosis Coding ICD-10 Codes Code Description I89.0 Lymphedema, not elsewhere classified L97.821 Non-pressure chronic ulcer of other part of left lower leg limited to breakdown of  skin L97.811 Non-pressure chronic ulcer of other part of right lower leg limited to breakdown of skin I10 Essential (primary) hypertension Facility Procedures CPT4 Code: [id number] Description: [phone] - WOUND CARE VISIT-LEV 2 EST PT Modifier: Quantity: 1 Physician Procedures CPT4 Code: [id number] Description: 99213 - WC PHYS LEVEL 3 - EST PT Modifier: Quantity: 1 CPT4 Code: Description: ICD-10 Diagnosis Description I89.0 Lymphedema, not elsewhere classified L97.821 Non-pressure chronic ulcer of other part of left lower leg limited to brea L97.811 Non-pressure chronic ulcer of other part of right lower leg limited to bre I10  Essential (primary) hypertension Modifier: kdown of skin akdown of skin Quantity: Electronic Signature(s) Signed: 11/02/2019 9:22:22 AM By: Jeffrey Kelp PA-C Entered By: Jeffrey Lin on 11/02/2019 09:22:21

## 2019-11-07 ENCOUNTER — Other Ambulatory Visit: Payer: Self-pay | Admitting: Family Medicine

## 2019-11-16 ENCOUNTER — Encounter: Payer: PPO | Admitting: Physician Assistant

## 2019-11-16 ENCOUNTER — Other Ambulatory Visit: Payer: Self-pay

## 2019-11-16 DIAGNOSIS — I872 Venous insufficiency (chronic) (peripheral): Secondary | ICD-10-CM | POA: Diagnosis not present

## 2019-11-16 DIAGNOSIS — L97821 Non-pressure chronic ulcer of other part of left lower leg limited to breakdown of skin: Secondary | ICD-10-CM | POA: Diagnosis not present

## 2019-11-16 DIAGNOSIS — L97221 Non-pressure chronic ulcer of left calf limited to breakdown of skin: Secondary | ICD-10-CM | POA: Diagnosis not present

## 2019-11-16 DIAGNOSIS — I890 Lymphedema, not elsewhere classified: Secondary | ICD-10-CM | POA: Diagnosis not present

## 2019-11-16 NOTE — Progress Notes (Addendum)
KEANEN, DOHSE ([id number]) Visit Report for 11/16/2019 Chief Complaint Document Details Patient Name: Jeffrey Lin, Jeffrey Lin Date of Service: 11/16/2019 1:15 PM Medical Record Number: [id number] Patient Account Number: [id number] Date of Birth/Sex: 1975/07/11 (44 y.o. M) Treating RN: Huel Coventry Primary Care Provider: Kerby Nora Other Clinician: Referring Provider: Kerby Nora Treating Provider/Extender: Rowan Blase in Treatment: 8 Information Obtained from: Patient Chief Complaint Bilateral LE Lymphedema Electronic Signature(s) Signed: 11/16/2019 1:53:39 PM By: Lenda Kelp PA-C Entered By: Lenda Kelp on 11/16/2019 13:53:39 Mcquaid, Deeann Saint ([id number]) -------------------------------------------------------------------------------- HPI Details Patient Name: Jeffrey Lin Date of Service: 11/16/2019 1:15 PM Medical Record Number: [id number] Patient Account Number: [id number] Date of Birth/Sex: 1975-02-18 (44 y.o. M) Treating RN: Huel Coventry Primary Care Provider: Kerby Nora Other Clinician: Referring Provider: Kerby Nora Treating Provider/Extender: Rowan Blase in Treatment: 8 History of Present Illness HPI Description: 09/20/2019 upon evaluation today patient presents for initial evaluation in our clinic concerning issues that he has been having with wounds over the bilateral lower extremities. Both legs seem to be fairly equally affected at this point based on what I am seeing. With that being said the patient also appears to be having some drainage which is somewhat blue/green in nature and does have me concerned about the possibility of infection. I explained this often can indicate a Pseudomonas infection despite the fact that the patient has been on antibiotics currently this could still be problematic. He currently does not have home health that is something that we did discussed the potential for looking into. I think that could be beneficial for him to  be honest. The patient seems somewhat frustrated with the situation in general. He does have a history of lymphedema, hypertension, and he does ambulate and walk though not a lot and tells me that he really does not drive unless is absolutely forced for something he absolutely has to do otherwise he is essentially homebound at this point. He did have a believe his mother with him today. 09/28/2019 on evaluation today patient appears to be doing better in regard to his leg ulcers at this point. The antibiotic does seem to be helping and overall he tells me that his pain is less though he still is having discomfort. Fortunately there is no signs of active infection at this time. 10/04/2019 upon evaluation today patient appears to be doing well at this point in regard to his wounds. He in fact is improving dramatically from the standpoint of his infection which I think is under great control. I see no evidence of infection systemically at this point and overall I am extremely pleased I think he is getting close to resolution to be honest though he still has several scattered areas at this point. 10/12/2019 upon evaluation today patient actually seems to be making excellent progress in regard to his leg ulcers at this point. He just has a small area on the left posterior and a small area on the right anterior leg that are still open. Everything else has completely resolved and seems to have done excellent up to this point. 10/19/2019 upon evaluation today patient actually appears to be doing much better on the right leg with regard to the open wounds in fact there is nothing remaining at this point which is great news. Overall I am very pleased with where things stand. I do believe that the compression has helped he still has edema which is quite significant I still believe lymphedema pumps would be beneficial for  him. With regard to the left leg he still has again the edema and this is nonetheless quite  significant despite compression therapy but still I do believe that he is doing much better with the compression I do believe again lymphedema pumps would be of benefit in this regard as well. He has been trying to elevate and move around/exercise much as possible but nonetheless I still think that the compression which he does have his compression socks today as well as lymphedema pumps would greatly benefit him as far as preventing future ulcerations/blisters that would lead to further expensive wound care visits. 10/25/2019 on evaluation today patient appears to be doing excellent in regard to his legs bilaterally. In fact there is only one small area on the posterior aspect of his left leg that appears to be an issue at this time. Fortunately there is no signs of active infection which is great news. No fevers, chills, nausea, vomiting, or diarrhea. I do believe that for all intents purposes this is completely healed I think it would be appropriate for [place] to make a referral to the lymphedema clinic for him at this point. 11/02/2019 on evaluation today patient appears to be doing well with regard to his legs bilaterally. I do not see anything that remains open at this point he still has swelling but he does have new compression socks that he got as well. Fortunately there is no signs of active infection at this time which is great news. We did make a referral to lymphedema clinic he has not heard from them yet but I did recommend him giving them a call and we can give him the number for that today. 11/16/2019 upon evaluation today patient appears to be doing well with regard to his right leg. Unfortunately his left leg has started to weep and drain again he had a couple blister areas that are of concern at this point as well. Subsequently I do believe that the patient would benefit from likely reinitiation of compression wrapping of the left leg based on what I am seeing currently. Fortunately there  is no signs of active infection at this time. Electronic Signature(s) Signed: 11/16/2019 2:30:13 PM By: Lenda Kelp PA-C Entered By: Lenda Kelp on 11/16/2019 14:30:13 NYLE, LIMB ([id number]) -------------------------------------------------------------------------------- Physical Exam Details Patient Name: Jeffrey Lin Date of Service: 11/16/2019 1:15 PM Medical Record Number: [id number] Patient Account Number: [id number] Date of Birth/Sex: Aug 16, 1975 (44 y.o. M) Treating RN: Huel Coventry Primary Care Provider: Kerby Nora Other Clinician: Referring Provider: Kerby Nora Treating Provider/Extender: Rowan Blase in Treatment: 8 Constitutional Well-nourished and well-hydrated in no acute distress. Respiratory normal breathing without difficulty. Psychiatric this patient is able to make decisions and demonstrates good insight into disease process. Alert and Oriented x 3. pleasant and cooperative. Notes Upon inspection patient's wound bed actually showed signs of good granulation at this time. There does not appear to be any evidence of active infection which is great news and overall I am extremely pleased with where things stand. With that being said he does have increased lymphedema and swelling on the left leg with some blistering and weeping. I do believe initiation of a compression wrap would be beneficial for him to try to help resolve this. Electronic Signature(s) Signed: 11/16/2019 2:30:42 PM By: Lenda Kelp PA-C Entered By: Lenda Kelp on 11/16/2019 14:30:41 Jeffrey Lin ([id number]) -------------------------------------------------------------------------------- Physician Orders Details Patient Name: Jeffrey Lin Date of Service: 11/16/2019 1:15 PM Medical Record  Number: [id number] Patient Account Number: [id number] Date of Birth/Sex: 10/29/1975 (44 y.o. M) Treating RN: Huel CoventryWoody, Kim Primary Care Provider: Kerby NoraBedsole, Amy Other  Clinician: Referring Provider: Kerby NoraBedsole, Amy Treating Provider/Extender: Rowan BlaseStone, Jadan Rouillard Weeks in Treatment: 8 Verbal / Phone Orders: No Diagnosis Coding ICD-10 Coding Code Description I89.0 Lymphedema, not elsewhere classified L97.821 Non-pressure chronic ulcer of other part of left lower leg limited to breakdown of skin L97.811 Non-pressure chronic ulcer of other part of right lower leg limited to breakdown of skin I10 Essential (primary) hypertension Wound Cleansing Wound #3 Left,Medial,Anterior Lower Leg o Dial antibacterial soap, wash wounds, rinse and pat dry prior to dressing wounds Wound #4 Left,Midline,Posterior Lower Leg o Dial antibacterial soap, wash wounds, rinse and pat dry prior to dressing wounds Skin Barriers/Peri-Wound Care Wound #3 Left,Medial,Anterior Lower Leg o Barrier cream Wound #4 Left,Midline,Posterior Lower Leg o Barrier cream Primary Wound Dressing Wound #3 Left,Medial,Anterior Lower Leg o Silver Alginate Wound #4 Left,Midline,Posterior Lower Leg o Silver Alginate Secondary Dressing Wound #3 Left,Medial,Anterior Lower Leg o XtraSorb Wound #4 Left,Midline,Posterior Lower Leg o XtraSorb Dressing Change Frequency Wound #3 Left,Medial,Anterior Lower Leg o Change dressing every week Wound #4 Left,Midline,Posterior Lower Leg o Change dressing every week Follow-up Appointments Wound #3 Left,Medial,Anterior Lower Leg o Return Appointment in 1 week. o Nurse Visit as needed Wound #4 Left,Midline,Posterior Lower Leg o Return Appointment in 1 week. o Nurse Visit as needed Edema Control Jeffrey GillisSTEWART, Estiben G. ([id number]) Wound #3 Left,Medial,Anterior Lower Leg o 4-Layer Compression System - Right Lower Extremity Wound #4 Left,Midline,Posterior Lower Leg o 4-Layer Compression System - Right Lower Extremity Electronic Signature(s) Signed: 11/16/2019 5:11:17 PM By: Lenda KelpStone III, Duncan Alejandro PA-C Signed: 11/17/2019 5:48:54 PM By: Elliot GurneyWoody, BSN,  RN, CWS, Kim RN, BSN Entered By: Elliot GurneyWoody, BSN, RN, CWS, Kim on 11/16/2019 14:01:15 Jeffrey GillisSTEWART, Alfons G. ([id number]) -------------------------------------------------------------------------------- Problem List Details Patient Name: Jeffrey GillisSTEWART, Shlok G. Date of Service: 11/16/2019 1:15 PM Medical Record Number: [id number] Patient Account Number: [id number] Date of Birth/Sex: 10/17/1975 (44 y.o. M) Treating RN: Huel CoventryWoody, Kim Primary Care Provider: Kerby NoraBedsole, Amy Other Clinician: Referring Provider: Kerby NoraBedsole, Amy Treating Provider/Extender: Rowan BlaseStone, Raine Blodgett Weeks in Treatment: 8 Active Problems ICD-10 Encounter Code Description Active Date MDM Diagnosis I89.0 Lymphedema, not elsewhere classified 09/20/2019 No Yes L97.821 Non-pressure chronic ulcer of other part of left lower leg limited to 09/20/2019 No Yes breakdown of skin L97.811 Non-pressure chronic ulcer of other part of right lower leg limited to 09/20/2019 No Yes breakdown of skin I10 Essential (primary) hypertension 09/20/2019 No Yes Inactive Problems Resolved Problems Electronic Signature(s) Signed: 11/16/2019 1:53:34 PM By: Lenda KelpStone III, Hale Chalfin PA-C Entered By: Lenda KelpStone III, Edra Riccardi on 11/16/2019 13:53:33 Ipock, Deeann SaintBOBBY G. ([id number]) -------------------------------------------------------------------------------- Progress Note Details Patient Name: Jeffrey GillisSTEWART, Jacarri G. Date of Service: 11/16/2019 1:15 PM Medical Record Number: [id number] Patient Account Number: [id number] Date of Birth/Sex: 05/30/1975 (44 y.o. M) Treating RN: Huel CoventryWoody, Kim Primary Care Provider: Kerby NoraBedsole, Amy Other Clinician: Referring Provider: Kerby NoraBedsole, Amy Treating Provider/Extender: Rowan BlaseStone, Delorice Bannister Weeks in Treatment: 8 Subjective Chief Complaint Information obtained from Patient Bilateral LE Lymphedema History of Present Illness (HPI) 09/20/2019 upon evaluation today patient presents for initial evaluation in our clinic concerning issues that he has been having with wounds over the bilateral  lower extremities. Both legs seem to be fairly equally affected at this point based on what I am seeing. With that being said the patient also appears to be having some drainage which is somewhat blue/green in nature and does have me concerned about the possibility of infection. I explained this often can indicate  a Pseudomonas infection despite the fact that the patient has been on antibiotics currently this could still be problematic. He currently does not have home health that is something that we did discussed the potential for looking into. I think that could be beneficial for him to be honest. The patient seems somewhat frustrated with the situation in general. He does have a history of lymphedema, hypertension, and he does ambulate and walk though not a lot and tells me that he really does not drive unless is absolutely forced for something he absolutely has to do otherwise he is essentially homebound at this point. He did have a believe his mother with him today. 09/28/2019 on evaluation today patient appears to be doing better in regard to his leg ulcers at this point. The antibiotic does seem to be helping and overall he tells me that his pain is less though he still is having discomfort. Fortunately there is no signs of active infection at this time. 10/04/2019 upon evaluation today patient appears to be doing well at this point in regard to his wounds. He in fact is improving dramatically from the standpoint of his infection which I think is under great control. I see no evidence of infection systemically at this point and overall I am extremely pleased I think he is getting close to resolution to be honest though he still has several scattered areas at this point. 10/12/2019 upon evaluation today patient actually seems to be making excellent progress in regard to his leg ulcers at this point. He just has a small area on the left posterior and a small area on the right anterior leg that are  still open. Everything else has completely resolved and seems to have done excellent up to this point. 10/19/2019 upon evaluation today patient actually appears to be doing much better on the right leg with regard to the open wounds in fact there is nothing remaining at this point which is great news. Overall I am very pleased with where things stand. I do believe that the compression has helped he still has edema which is quite significant I still believe lymphedema pumps would be beneficial for him. With regard to the left leg he still has again the edema and this is nonetheless quite significant despite compression therapy but still I do believe that he is doing much better with the compression I do believe again lymphedema pumps would be of benefit in this regard as well. He has been trying to elevate and move around/exercise much as possible but nonetheless I still think that the compression which he does have his compression socks today as well as lymphedema pumps would greatly benefit him as far as preventing future ulcerations/blisters that would lead to further expensive wound care visits. 10/25/2019 on evaluation today patient appears to be doing excellent in regard to his legs bilaterally. In fact there is only one small area on the posterior aspect of his left leg that appears to be an issue at this time. Fortunately there is no signs of active infection which is great news. No fevers, chills, nausea, vomiting, or diarrhea. I do believe that for all intents purposes this is completely healed I think it would be appropriate for [place] to make a referral to the lymphedema clinic for him at this point. 11/02/2019 on evaluation today patient appears to be doing well with regard to his legs bilaterally. I do not see anything that remains open at this point he still has swelling but he  does have new compression socks that he got as well. Fortunately there is no signs of active infection at this time  which is great news. We did make a referral to lymphedema clinic he has not heard from them yet but I did recommend him giving them a call and we can give him the number for that today. 11/16/2019 upon evaluation today patient appears to be doing well with regard to his right leg. Unfortunately his left leg has started to weep and drain again he had a couple blister areas that are of concern at this point as well. Subsequently I do believe that the patient would benefit from likely reinitiation of compression wrapping of the left leg based on what I am seeing currently. Fortunately there is no signs of active infection at this time. Objective Constitutional Well-nourished and well-hydrated in no acute distress. Vitals Time Taken: 1:29 PM, Height: 77 in, Weight: 265 lbs, BMI: 31.4, Temperature: 97.6 F, Pulse: 111 bpm, Respiratory Rate: 20 breaths/min, Gashi, Decklyn G. ([id number]) Blood Pressure: 163/103 mmHg. Respiratory normal breathing without difficulty. Psychiatric this patient is able to make decisions and demonstrates good insight into disease process. Alert and Oriented x 3. pleasant and cooperative. General Notes: Upon inspection patient's wound bed actually showed signs of good granulation at this time. There does not appear to be any evidence of active infection which is great news and overall I am extremely pleased with where things stand. With that being said he does have increased lymphedema and swelling on the left leg with some blistering and weeping. I do believe initiation of a compression wrap would be beneficial for him to try to help resolve this. Integumentary (Hair, Skin) Wound #3 status is Open. Original cause of wound was Gradually Appeared. The wound is located on the Left,Medial,Anterior Lower Leg. The wound measures 1.8cm length x 0.6cm width x 0.1cm depth; 0.848cm^2 area and 0.085cm^3 volume. Wound #4 status is Open. Original cause of wound was Gradually Appeared.  The wound is located on the Left,Midline,Posterior Lower Leg. The wound measures 0.8cm length x 0.4cm width x 0.1cm depth; 0.251cm^2 area and 0.025cm^3 volume. 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 There was a Four Layer Compression Therapy Procedure with a pre-treatment ABI of 0.9 by Huel Coventry, RN. 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 There was a Four Layer Compression Therapy Procedure with a pre-treatment ABI of 0.9 by Huel Coventry, RN. Post procedure Diagnosis Wound #4: Same as Pre-Procedure Plan Wound Cleansing: Wound #3 Left,Medial,Anterior Lower Leg: Dial antibacterial soap, wash wounds, rinse and pat dry prior to dressing wounds Wound #4 Left,Midline,Posterior Lower Leg: Dial antibacterial soap, wash wounds, rinse and pat dry prior to dressing wounds Skin Barriers/Peri-Wound Care: Wound #3 Left,Medial,Anterior Lower Leg: Barrier cream Wound #4 Left,Midline,Posterior Lower Leg: Barrier cream Primary Wound Dressing: Wound #3 Left,Medial,Anterior Lower Leg: Silver Alginate Wound #4 Left,Midline,Posterior Lower Leg: Silver Alginate [facility], Jachai GMarland Kitchen ([id number]) Secondary Dressing: Wound #3 Left,Medial,Anterior Lower Leg: XtraSorb Wound #4 Left,Midline,Posterior Lower Leg: XtraSorb Dressing Change Frequency: Wound #3 Left,Medial,Anterior Lower Leg: Change dressing every week  Wound #4 Left,Midline,Posterior Lower Leg: Change dressing every week Follow-up Appointments: Wound  #3 Left,Medial,Anterior Lower Leg: Return Appointment in 1 week. Nurse Visit as needed Wound #4 Left,Midline,Posterior Lower Leg: Return Appointment in 1 week. Nurse Visit as needed Edema Control: Wound #3 Left,Medial,Anterior Lower Leg: 4-Layer Compression System - Right Lower Extremity Wound #4 Left,Midline,Posterior Lower Leg: 4-Layer Compression System - Right Lower Extremity 1. I would recommend currently that we go ahead and initiate an alginate dressing to the open draining locations. 2. I am also can recommend that we initiate a 4-layer compression wrap to left lower extremity to try to get some of this edema and swelling out hopefully that could help this to resolve fairly quickly. Will use XtraSorb as well to catch the excessive drainage. 3. I am also going to suggest the patient needs to elevate his legs as much as possible to try to keep edema under control. We will see patient back for reevaluation in 1 week here in the clinic. If anything worsens or changes patient will contact our office for additional recommendations. Electronic Signature(s) Signed: 11/16/2019 2:32:20 PM By: Lenda Kelp PA-C Entered By: Lenda Kelp on 11/16/2019 14:32:19 HUTCHINSON, ISENBERG ([id number]) -------------------------------------------------------------------------------- SuperBill Details Patient Name: Jeffrey Lin Date of Service: 11/16/2019 Medical Record Number: [id number] Patient Account Number: [id number] Date of Birth/Sex: Jul 28, 1975 (44 y.o. M) Treating RN: Huel Coventry Primary Care Provider: Kerby Nora Other Clinician: Referring Provider: Kerby Nora Treating Provider/Extender: Rowan Blase in Treatment: 8 Diagnosis Coding ICD-10 Codes Code Description I89.0 Lymphedema, not elsewhere classified L97.821 Non-pressure chronic ulcer of other part of left lower leg limited to breakdown of skin L97.811 Non-pressure chronic ulcer of other part of right lower leg limited  to breakdown of skin I10 Essential (primary) hypertension Facility Procedures CPT4 Code: [id number] Description: (Facility Use Only) [phone] - APPLY MULTLAY COMPRS LWR LT LEG Modifier: Quantity: 1 Physician Procedures CPT4 Code: [id number] Description: 99213 - WC PHYS LEVEL 3 - EST PT Modifier: Quantity: 1 CPT4 Code: Description: ICD-10 Diagnosis Description I89.0 Lymphedema, not elsewhere classified L97.821 Non-pressure chronic ulcer of other part of left lower leg limited to brea L97.811 Non-pressure chronic ulcer of other part of right lower leg limited to bre I10  Essential (primary) hypertension Modifier: kdown of skin akdown of skin Quantity: Electronic Signature(s) Signed: 11/16/2019 2:32:41 PM By: Lenda Kelp PA-C Previous Signature: 11/16/2019 2:16:19 PM Version By: Elliot Gurney, BSN, RN, CWS, Kim RN, BSN Entered By: Lenda Kelp on 11/16/2019 14:32:41

## 2019-11-17 DIAGNOSIS — I890 Lymphedema, not elsewhere classified: Secondary | ICD-10-CM | POA: Diagnosis not present

## 2019-11-18 NOTE — Progress Notes (Signed)
Jeffrey Lin ([id number]) Visit Report for 11/16/2019 Arrival Information Details Patient Name: Jeffrey Lin Date of Service: 11/16/2019 1:15 PM Medical Record Number: [id number] Patient Account Number: [id number] Date of Birth/Sex: [birth year] (44 y.o. M) Treating RN: Huel Coventry Primary Care Nataniel Gasper: Kerby Nora Other Clinician: Referring Macon Sandiford: Kerby Nora Treating Andreka Stucki/Extender: Rowan Blase in Treatment: 8 Visit Information History Since Last Visit Pain Present Now: No Patient Arrived: Ambulatory Arrival Time: 13:29 Accompanied By: self Transfer Assistance: None Patient Identification Verified: Yes Secondary Verification Process Completed: Yes Patient Requires Transmission-Based Precautions: No Patient Has Alerts: No Electronic Signature(s) Signed: 11/17/2019 5:48:54 PM By: Elliot Gurney, BSN, RN, CWS, Kim RN, BSN Entered By: Elliot Gurney, BSN, RN, CWS, Kim on 11/16/2019 13:29:40 Jeffrey Lin ([id number]) -------------------------------------------------------------------------------- Compression Therapy Details Patient Name: Jeffrey Lin Date of Service: 11/16/2019 1:15 PM Medical Record Number: [id number] Patient Account Number: [id number] Date of Birth/Sex: 05-01-1975 (44 y.o. M) Treating RN: Huel Coventry Primary Care Reniyah Gootee: Kerby Nora Other Clinician: Referring Rohil Lesch: Kerby Nora Treating Sonnie Pawloski/Extender: Rowan Blase in Treatment: 8 Compression Therapy Performed for Wound Assessment: Wound #3 Left,Medial,Anterior Lower Leg Performed By: Clinician Huel Coventry, RN Compression Type: Four Layer Pre Treatment ABI: 0.9 Post Procedure Diagnosis Same as Pre-procedure Electronic Signature(s) Signed: 11/17/2019 5:48:54 PM By: Elliot Gurney, BSN, RN, CWS, Kim RN, BSN Entered By: Elliot Gurney, BSN, RN, CWS, Kim on 11/16/2019 13:59:45 Jeffrey Lin ([id number]) -------------------------------------------------------------------------------- Compression Therapy  Details Patient Name: Jeffrey Lin Date of Service: 11/16/2019 1:15 PM Medical Record Number: [id number] Patient Account Number: [id number] Date of Birth/Sex: 09-Sep-1975 (44 y.o. M) Treating RN: Huel Coventry Primary Care Daryll Spisak: Kerby Nora Other Clinician: Referring Leta Bucklin: Kerby Nora Treating Dewel Lotter/Extender: Rowan Blase in Treatment: 8 Compression Therapy Performed for Wound Assessment: Wound #4 Left,Midline,Posterior Lower Leg Performed By: Clinician Huel Coventry, RN Compression Type: Four Layer Pre Treatment ABI: 0.9 Post Procedure Diagnosis Same as Pre-procedure Electronic Signature(s) Signed: 11/17/2019 5:48:54 PM By: Elliot Gurney, BSN, RN, CWS, Kim RN, BSN Entered By: Elliot Gurney, BSN, RN, CWS, Kim on 11/16/2019 13:59:45 Jeffrey Lin ([id number]) -------------------------------------------------------------------------------- Encounter Discharge Information Details Patient Name: Jeffrey Lin Date of Service: 11/16/2019 1:15 PM Medical Record Number: [id number] Patient Account Number: [id number] Date of Birth/Sex: Sep 02, 1975 (44 y.o. M) Treating RN: Huel Coventry Primary Care Keelon Zurn: Kerby Nora Other Clinician: Referring Cerita Rabelo: Kerby Nora Treating Jemell Town/Extender: Rowan Blase in Treatment: 8 Encounter Discharge Information Items Discharge Condition: Stable Ambulatory Status: Ambulatory Discharge Destination: Home Transportation: Private Auto Accompanied By: self Schedule Follow-up Appointment: Yes Clinical Summary of Care: Electronic Signature(s) Signed: 11/16/2019 2:18:45 PM By: Elliot Gurney, BSN, RN, CWS, Kim RN, BSN Entered By: Elliot Gurney, BSN, RN, CWS, Kim on 11/16/2019 14:18:45 Jeffrey Lin ([id number]) -------------------------------------------------------------------------------- Lower Extremity Assessment Details Patient Name: Jeffrey Lin Date of Service: 11/16/2019 1:15 PM Medical Record Number: [id number] Patient Account Number:  [id number] Date of Birth/Sex: [birth year] (44 y.o. M) Treating RN: Huel Coventry Primary Care Tekoa Amon: Kerby Nora Other Clinician: Referring Kahner Yanik: Kerby Nora Treating Amel Gianino/Extender: Rowan Blase in Treatment: 8 Edema Assessment Assessed: [Left: No] [Right: No] Edema: [Left: Yes] [Right: Yes] Calf Left: Right: Point of Measurement: 35 cm From Medial Instep 50.5 cm 44 cm Ankle Left: Right: Point of Measurement: 10 cm From Medial Instep 38.3 cm 35 cm Vascular Assessment Pulses: Dorsalis Pedis Palpable: [Left:No] [Right:No] Doppler Audible: [Left:Yes] [Right:Yes] Posterior Tibial Palpable: [Left:No Yes] [Right:No Yes] Electronic Signature(s) Signed: 11/17/2019 5:48:54 PM By: Elliot Gurney, BSN, RN, CWS, Kim RN, BSN Entered By: Elliot Gurney, BSN, RN, CWS, Kim on  11/16/2019 13:41:32 Jeffrey Lin, Jeffrey Lin ([id number]) -------------------------------------------------------------------------------- Multi Wound Chart Details Patient Name: Jeffrey Lin, Jeffrey Lin Date of Service: 11/16/2019 1:15 PM Medical Record Number: [id number] Patient Account Number: [id number] Date of Birth/Sex: 1975/04/30 (44 y.o. M) Treating RN: Huel Coventry Primary Care Salbador Fiveash: Kerby Nora Other Clinician: Referring Marika Mahaffy: Kerby Nora Treating Shalay Carder/Extender: Rowan Blase in Treatment: 8 Vital Signs Height(in): 77 Pulse(bpm): 111 Weight(lbs): 265 Blood Pressure(mmHg): 163/103 Body Mass Index(BMI): 31 Temperature(F): 97.6 Respiratory Rate(breaths/min): 20 Photos: [3:No Photos] [4:No Photos] [N/A:N/A] Wound Location: [3:Left, Medial, Anterior Lower Leg] [4:Left, Midline, Posterior Lower Leg] [N/A:N/A] Wounding Event: [3:Gradually Appeared] [4:Gradually Appeared] [N/A:N/A] Primary Etiology: [3:Venous Leg Ulcer] [4:Venous Leg Ulcer] [N/A:N/A] Date Acquired: [3:11/09/2019] [4:11/09/2019] [N/A:N/A] Weeks of Treatment: [3:0] [4:0] [N/A:N/A] Wound Status: [3:Open] [4:Open] [N/A:N/A] Measurements L x W x D  (cm) [3:1.8x0.6x0.1] [4:0.8x0.4x0.1] [N/A:N/A] Area (cm) : [3:0.848 0.085] [4:0.251 0.025] [N/A:N/A N/A] Treatment Notes Electronic Signature(s) Signed: 11/17/2019 5:48:54 PM By: Elliot Gurney, BSN, RN, CWS, Kim RN, BSN Entered By: Elliot Gurney, BSN, RN, CWS, Kim on 11/16/2019 13:59:21 Jeffrey Lin, Jeffrey Lin ([id number]) -------------------------------------------------------------------------------- Multi-Disciplinary Care Plan Details Patient Name: Jeffrey Lin Date of Service: 11/16/2019 1:15 PM Medical Record Number: [id number] Patient Account Number: [id number] Date of Birth/Sex: Mar 29, 1975 (44 y.o. M) Treating RN: Huel Coventry Primary Care Aldo Sondgeroth: Kerby Nora Other Clinician: Referring Terren Jandreau: Kerby Nora Treating Kenady Doxtater/Extender: Rowan Blase in Treatment: 8 Active Inactive Orientation to the Wound Care Program Nursing Diagnoses: Knowledge deficit related to the wound healing center program Goals: Patient/caregiver will verbalize understanding of the Wound Healing Center Program Date Initiated: 09/20/2019 Target Resolution Date: 09/24/2019 Goal Status: Active Interventions: Provide education on orientation to the wound center Notes: Venous Leg Ulcer Nursing Diagnoses: Actual venous Insuffiency (use after diagnosis is confirmed) Knowledge deficit related to disease process and management Potential for venous Insuffiency (use before diagnosis confirmed) Goals: Non-invasive venous studies are completed as ordered Date Initiated: 11/16/2019 Target Resolution Date: 11/23/2019 Goal Status: Active Patient will maintain optimal edema control Date Initiated: 11/16/2019 Target Resolution Date: 11/16/2019 Goal Status: Active Patient/caregiver will verbalize understanding of disease process and disease management Date Initiated: 11/16/2019 Target Resolution Date: 11/16/2019 Goal Status: Active Interventions: Compression as ordered Notes: Wound/Skin Impairment Nursing  Diagnoses: Impaired tissue integrity Knowledge deficit related to ulceration/compromised skin integrity Goals: Patient/caregiver will verbalize understanding of skin care regimen Date Initiated: 09/20/2019 Target Resolution Date: 10/01/2019 Goal Status: Active Interventions: Assess patient/caregiver ability to obtain necessary supplies Assess patient/caregiver ability to perform ulcer/skin care regimen upon admission and as needed Assess ulceration(s) every visit Provide education on ulcer and skin care Jeffrey Lin, Jeffrey Lin ([id number]) Treatment Activities: Skin care regimen initiated : 09/20/2019 Topical wound management initiated : 09/20/2019 Notes: Electronic Signature(s) Signed: 11/17/2019 5:48:54 PM By: Elliot Gurney, BSN, RN, CWS, Kim RN, BSN Entered By: Elliot Gurney, BSN, RN, CWS, Kim on 11/16/2019 13:59:13 Jeffrey Lin ([id number]) -------------------------------------------------------------------------------- Pain Assessment Details Patient Name: Jeffrey Lin Date of Service: 11/16/2019 1:15 PM Medical Record Number: [id number] Patient Account Number: [id number] Date of Birth/Sex: 02-10-1975 (44 y.o. M) Treating RN: Huel Coventry Primary Care Lateefa Crosby: Kerby Nora Other Clinician: Referring Ramon Zanders: Kerby Nora Treating Cassey Hurrell/Extender: Rowan Blase in Treatment: 8 Active Problems Location of Pain Severity and Description of Pain Patient Has Paino No Site Locations Pain Management and Medication Current Pain Management: Electronic Signature(s) Signed: 11/17/2019 5:48:54 PM By: Elliot Gurney, BSN, RN, CWS, Kim RN, BSN Entered By: Elliot Gurney, BSN, RN, CWS, Kim on 11/16/2019 13:30:28 Jeffrey Lin ([id number]) -------------------------------------------------------------------------------- Patient/Caregiver Education Details Patient Name: Jeffrey Lin Date  of Service: 11/16/2019 1:15 PM Medical Record Number: [id number] Patient Account Number: [id number] Date of Birth/Gender:  December 06, 1975 (44 y.o. M) Treating RN: Huel Coventry Primary Care Physician: Kerby Nora Other Clinician: Referring Physician: Kerby Nora Treating Physician/Extender: Rowan Blase in Treatment: 8 Education Assessment Education Provided To: Patient Education Topics Provided Venous: Handouts: Controlling Swelling with Multilayered Compression Wraps Methods: Demonstration, Explain/Verbal Responses: State content correctly Electronic Signature(s) Signed: 11/17/2019 5:48:54 PM By: Elliot Gurney, BSN, RN, CWS, Kim RN, BSN Entered By: Elliot Gurney, BSN, RN, CWS, Kim on 11/16/2019 14:16:42 Jeffrey Lin ([id number]) -------------------------------------------------------------------------------- Wound Assessment Details Patient Name: Jeffrey Lin Date of Service: 11/16/2019 1:15 PM Medical Record Number: [id number] Patient Account Number: [id number] Date of Birth/Sex: 06-Jan-1976 (44 y.o. M) Treating RN: Huel Coventry Primary Care Robertson Colclough: Kerby Nora Other Clinician: Referring Eros Montour: Kerby Nora Treating Seena Face/Extender: Rowan Blase in Treatment: 8 Wound Status Wound Number: 3 Primary Etiology: Venous Leg Ulcer Wound Location: Left, Medial, Anterior Lower Leg Wound Status: Open Wounding Event: Gradually Appeared Date Acquired: 11/09/2019 Weeks Of Treatment: 0 Clustered Wound: No Wound Measurements Length: (cm) Width: (cm) Depth: (cm) Area: (cm) Volume: (cm) 1.8 % Reduction in Area: 0.6 % Reduction in Volume: 0.1 0.848 0.085 Treatment Notes Wound #3 (Left, Medial, Anterior Lower Leg) Notes Barrier cream, lg silver alginate x 2, sm xsorb x 2, 4 layer left Electronic Signature(s) Signed: 11/17/2019 5:48:54 PM By: Elliot Gurney, BSN, RN, CWS, Kim RN, BSN Entered By: Elliot Gurney, BSN, RN, CWS, Kim on 11/16/2019 13:28:52 Jeffrey Lin ([id number]) -------------------------------------------------------------------------------- Wound Assessment Details Patient Name: Jeffrey Lin Date of Service: 11/16/2019 1:15 PM Medical Record Number: [id number] Patient Account Number: [id number] Date of Birth/Sex: 1975-04-15 (44 y.o. M) Treating RN: Huel Coventry Primary Care Ean Gettel: Kerby Nora Other Clinician: Referring Roshawnda Pecora: Kerby Nora Treating Cathey Fredenburg/Extender: Rowan Blase in Treatment: 8 Wound Status Wound Number: 4 Primary Etiology: Venous Leg Ulcer Wound Location: Left, Midline, Posterior Lower Leg Wound Status: Open Wounding Event: Gradually Appeared Date Acquired: 11/09/2019 Weeks Of Treatment: 0 Clustered Wound: No Wound Measurements Length: (cm) Width: (cm) Depth: (cm) Area: (cm) Volume: (cm) 0.8 % Reduction in Area: 0.4 % Reduction in Volume: 0.1 0.251 0.025 Treatment Notes Wound #4 (Left, Midline, Posterior Lower Leg) Notes Barrier cream, lg silver alginate x 2, sm xsorb x 2, 4 layer left Electronic Signature(s) Signed: 11/17/2019 5:48:54 PM By: Elliot Gurney, BSN, RN, CWS, Kim RN, BSN Entered By: Elliot Gurney, BSN, RN, CWS, Kim on 11/16/2019 13:28:52 Jeffrey Lin ([id number]) -------------------------------------------------------------------------------- Vitals Details Patient Name: Jeffrey Lin Date of Service: 11/16/2019 1:15 PM Medical Record Number: [id number] Patient Account Number: [id number] Date of Birth/Sex: September 23, 1975 (44 y.o. M) Treating RN: Huel Coventry Primary Care Bryley Kovacevic: Kerby Nora Other Clinician: Referring Jarae Nemmers: Kerby Nora Treating Rashaan Wyles/Extender: Rowan Blase in Treatment: 8 Vital Signs Time Taken: 13:29 Temperature (F): 97.6 Height (in): 77 Pulse (bpm): 111 Weight (lbs): 265 Respiratory Rate (breaths/min): 20 Body Mass Index (BMI): 31.4 Blood Pressure (mmHg): 163/103 Reference Range: 80 - 120 mg / dl Electronic Signature(s) Signed: 11/17/2019 5:48:54 PM By: Elliot Gurney, BSN, RN, CWS, Kim RN, BSN Entered By: Elliot Gurney, BSN, RN, CWS, Kim on 11/16/2019 13:30:16

## 2019-11-19 ENCOUNTER — Telehealth: Payer: Self-pay | Admitting: Family

## 2019-11-19 NOTE — Telephone Encounter (Signed)
Received notification from [employer] that lymphedema pumps were shipped 11/17/19 and that their technician (Jeffrey Lin) would be reaching out to patient to set up home visit for set up assistance.  Called and spoke with Jeffrey Lin per DPR. She said the lymphedema pumps have been delivered. Advised to expect call from Jeffrey Lin next week. She was appreciative of the call and verbalized understanding.   Alver Sorrow, NP

## 2019-11-19 NOTE — Telephone Encounter (Signed)
Received notification from [employer] that they received approval for the pump on the appeal - the pump shipped 11/17/19 and is to arrive likely by Friday evening. The delivery technician from their company is going to reach out to him to find when she can get him set up.   Alver Sorrow, NP

## 2019-11-23 ENCOUNTER — Encounter: Payer: PPO | Attending: Physician Assistant | Admitting: Physician Assistant

## 2019-11-23 ENCOUNTER — Other Ambulatory Visit: Payer: Self-pay

## 2019-11-23 DIAGNOSIS — I10 Essential (primary) hypertension: Secondary | ICD-10-CM | POA: Insufficient documentation

## 2019-11-23 DIAGNOSIS — L97811 Non-pressure chronic ulcer of other part of right lower leg limited to breakdown of skin: Secondary | ICD-10-CM | POA: Diagnosis not present

## 2019-11-23 DIAGNOSIS — L97821 Non-pressure chronic ulcer of other part of left lower leg limited to breakdown of skin: Secondary | ICD-10-CM | POA: Diagnosis not present

## 2019-11-23 DIAGNOSIS — I890 Lymphedema, not elsewhere classified: Secondary | ICD-10-CM | POA: Diagnosis not present

## 2019-11-23 DIAGNOSIS — L97511 Non-pressure chronic ulcer of other part of right foot limited to breakdown of skin: Secondary | ICD-10-CM | POA: Diagnosis not present

## 2019-11-23 DIAGNOSIS — Z09 Encounter for follow-up examination after completed treatment for conditions other than malignant neoplasm: Secondary | ICD-10-CM | POA: Insufficient documentation

## 2019-11-23 NOTE — Progress Notes (Signed)
MANTAJ, CHAMBERLIN ([id number]) Visit Report for 11/23/2019 Arrival Information Details Patient Name: Jeffrey Lin, Jeffrey Lin Date of Service: 11/23/2019 12:30 PM Medical Record Number: [id number] Patient Account Number: [id number] Date of Birth/Sex: [birth year] (44 y.o. M) Treating RN: Rogers Blocker Primary Care Orma Cheetham: Kerby Nora Other Clinician: Referring Rich Paprocki: Kerby Nora Treating Serrina Minogue/Extender: Rowan Blase in Treatment: 9 Visit Information History Since Last Visit Pain Present Now: No Patient Arrived: Ambulatory Arrival Time: 12:45 Accompanied By: self Transfer Assistance: None Patient Identification Verified: Yes Secondary Verification Process Completed: Yes Patient Requires Transmission-Based Precautions: No Patient Has Alerts: No Electronic Signature(s) Signed: 11/23/2019 1:50:49 PM By: Phillis Haggis, Dondra Prader Entered By: Phillis Haggis, Dondra Prader on 11/23/2019 12:46:05 Jeffrey Lin ([id number]) -------------------------------------------------------------------------------- Clinic Level of Care Assessment Details Patient Name: Jeffrey Lin Date of Service: 11/23/2019 12:30 PM Medical Record Number: [id number] Patient Account Number: [id number] Date of Birth/Sex: 1975/04/03 (44 y.o. M) Treating RN: Rogers Blocker Primary Care Wynee Matarazzo: Kerby Nora Other Clinician: Referring Maurie Musco: Kerby Nora Treating Jayden Kratochvil/Extender: Rowan Blase in Treatment: 9 Clinic Level of Care Assessment Items TOOL 1 Quantity Score []  - Use when EandM and Procedure is performed on INITIAL visit 0 ASSESSMENTS - Nursing Assessment / Reassessment []  - General Physical Exam (combine w/ comprehensive assessment (listed just below) when performed on new 0 pt. evals) []  - 0 Comprehensive Assessment (HX, ROS, Risk Assessments, Wounds Hx, etc.) ASSESSMENTS - Wound and Skin Assessment / Reassessment []  - Dermatologic / Skin Assessment (not related to wound area) 0 ASSESSMENTS -  Ostomy and/or Continence Assessment and Care []  - Incontinence Assessment and Management 0 []  - 0 Ostomy Care Assessment and Management (repouching, etc.) PROCESS - Coordination of Care []  - Simple Patient / Family Education for ongoing care 0 []  - 0 Complex (extensive) Patient / Family Education for ongoing care []  - 0 Staff obtains , Records, Test Results / Process Orders []  - 0 Staff telephones HHA, Nursing Homes / Clarify orders / etc []  - 0 Routine Transfer to another Facility (non-emergent condition) []  - 0 Routine Hospital Admission (non-emergent condition) []  - 0 New Admissions / / Ordering NPWT, Apligraf, etc. []  - 0 Emergency Hospital Admission (emergent condition) PROCESS - Special Needs []  - Pediatric / Minor Patient Management 0 []  - 0 Isolation Patient Management []  - 0 Hearing / Language / Visual special needs []  - 0 Assessment of Community assistance (transportation, D/C planning, etc.) []  - 0 Additional assistance / Altered mentation []  - 0 Support Surface(s) Assessment (bed, cushion, seat, etc.) INTERVENTIONS - Miscellaneous []  - External ear exam 0 []  - 0 Patient Transfer (multiple staff / / Similar devices) []  - 0 Simple Staple / Suture removal (25 or less) []  - 0 Complex Staple / Suture removal (26 or more) []  - 0 Hypo/Hyperglycemic Management (do not check if billed separately) []  - 0 Ankle / Brachial Index (ABI) - do not check if billed separately Has the patient been seen at the hospital within the last three years: Yes Total Score: 0 Level Of Care: ____ ( ) Electronic Signature(s) Signed: 11/23/2019 1:50:49 PM By: , Entered By: , Chiropractor on 11/23/2019 13:22:42 ( ) -------------------------------------------------------------------------------- Compression Therapy Details Patient Name: Date of  Service: 11/23/2019 12:30 PM Medical Record Number: Patient Account Number: Date of Birth/Sex: January 25, 1975 (44 y.o. M) Treating RN: Primary Care Giovannie Scerbo: Other Clinician: Referring Chrishun Scheer: Treating Rocky Gladden/Extender:  Weeks in Treatment: 9 Compression Therapy Performed for Wound Assessment: NonWound Condition Lymphedema - Left Leg Performed By: Ovidio Hangerlinician Sanchez, Kenia, RN Compression Type: Four Layer Pre Treatment ABI: 0.9 Post Procedure Diagnosis Same as Pre-procedure Electronic Signature(s) Signed: 11/23/2019 1:50:49 PM By: Phillis HaggisSanchez Pereyda, Dondra PraderKenia Entered By: Phillis HaggisSanchez Pereyda, Dondra PraderKenia on 11/23/2019 13:22:16 Jeffrey Lin, Lytle G. ([id number]) -------------------------------------------------------------------------------- Encounter Discharge Information Details Patient Name: Jeffrey Lin, Jeffrey G. Date of Service: 11/23/2019 12:30 PM Medical Record Number: [id number] Patient Account Number: [id number] Date of Birth/Sex: 06/09/1975 (44 y.o. M) Treating RN: Rogers BlockerSanchez, Kenia Primary Care Cayci Mcnabb: Kerby NoraBedsole, Amy Other Clinician: Referring Berlie Hatchel: Kerby NoraBedsole, Amy Treating Evanne Matsunaga/Extender: Rowan BlaseStone, Hoyt Weeks in Treatment: 9 Encounter Discharge Information Items Discharge Condition: Stable Ambulatory Status: Ambulatory Discharge Destination: Home Transportation: Private Auto Accompanied By: self Schedule Follow-up Appointment: Yes Clinical Summary of Care: Electronic Signature(s) Signed: 11/23/2019 1:50:49 PM By: Phillis HaggisSanchez Pereyda, Dondra PraderKenia Entered By: Phillis HaggisSanchez Pereyda, Dondra PraderKenia on 11/23/2019 13:20:13 Jeffrey Lin, Jeffrey G. ([id number]) -------------------------------------------------------------------------------- Lower Extremity Assessment Details Patient Name: Jeffrey Lin, [name] Date of Service: 11/23/2019 12:30 PM Medical Record Number: [id number] Patient Account Number: [id number] Date of Birth/Sex: 03/26/1975 (44 y.o. M) Treating RN:  Rogers BlockerSanchez, Kenia Primary Care Theodoros Stjames: Kerby NoraBedsole, Amy Other Clinician: Referring Lariza Cothron: Kerby NoraBedsole, Amy Treating Jovanni Eckhart/Extender: Allen DerryStone, Hoyt Weeks in Treatment: 9 Edema Assessment Assessed: [Left: No] [Right: No] Edema: [Left: Yes] [Right: Yes] Calf Left: Right: Point of Measurement: 35 cm From Medial Instep 43.5 cm 48 cm Ankle Left: Right: Point of Measurement: 10 cm From Medial Instep 37 cm 35 cm Vascular Assessment Pulses: Dorsalis Pedis Palpable: [Left:Yes] [Right:Yes] Electronic Signature(s) Signed: 11/23/2019 1:50:49 PM By: Phillis HaggisSanchez Pereyda, Dondra PraderKenia Entered By: Phillis HaggisSanchez Pereyda, Dondra PraderKenia on 11/23/2019 13:07:26 Jeffrey Lin, Jeffrey G. ([id number]) -------------------------------------------------------------------------------- Multi Wound Chart Details Patient Name: Jeffrey Lin, Jeffrey G. Date of Service: 11/23/2019 12:30 PM Medical Record Number: [id number] Patient Account Number: [id number] Date of Birth/Sex: 12/16/1975 (44 y.o. M) Treating RN: Rogers BlockerSanchez, Kenia Primary Care Nathasha Fiorillo: Kerby NoraBedsole, Amy Other Clinician: Referring Shiron Whetsel: Kerby NoraBedsole, Amy Treating Jesson Foskey/Extender: Rowan BlaseStone, Hoyt Weeks in Treatment: 9 Vital Signs Height(in): 77 Pulse(bpm): 80 Weight(lbs): 265 Blood Pressure(mmHg): 158/74 Body Mass Index(BMI): 31 Temperature(F): 98.2 Respiratory Rate(breaths/min): 20 Photos: [N/A:N/A] Wound Location: Left, Medial, Anterior Lower Leg Left, Midline, Posterior Lower Leg N/A Wounding Event: Gradually Appeared Gradually Appeared N/A Primary Etiology: Venous Leg Ulcer Venous Leg Ulcer N/A Comorbid History: Hypertension Hypertension N/A Date Acquired: 11/09/2019 11/09/2019 N/A Weeks of Treatment: 1 1 N/A Wound Status: Open Open N/A Measurements L x W x D (cm) 1.5x1.9x0.1 3x2.6x0.1 N/A Area (cm) : 2.238 6.126 N/A Volume (cm) : 0.224 0.613 N/A % Reduction in Area: -163.90% -2340.60% N/A % Reduction in Volume: -163.50% -2352.00% N/A Classification: Partial Thickness Partial Thickness  N/A Granulation Amount: N/A Large (67-100%) N/A Granulation Quality: N/A Red N/A Necrotic Amount: N/A N/A N/A Epithelialization: Medium (34-66%) Small (1-33%) N/A Treatment Notes Electronic Signature(s) Signed: 11/23/2019 1:50:49 PM By: Phillis HaggisSanchez Pereyda, Dondra PraderKenia Entered By: Phillis HaggisSanchez Pereyda, Dondra PraderKenia on 11/23/2019 13:07:41 Jeffrey Lin, Namon G. ([id number]) -------------------------------------------------------------------------------- Multi-Disciplinary Care Plan Details Patient Name: Jeffrey Lin, Jeffrey G. Date of Service: 11/23/2019 12:30 PM Medical Record Number: [id number] Patient Account Number: [id number] Date of Birth/Sex: 09/30/1975 (44 y.o. M) Treating RN: Rogers BlockerSanchez, Kenia Primary Care Collins Kerby: Kerby NoraBedsole, Amy Other Clinician: Referring Avram Danielson: Kerby NoraBedsole, Amy Treating Octavius Shin/Extender: Rowan BlaseStone, Hoyt Weeks in Treatment: 9 Active Inactive Orientation to the Wound Care Program Nursing Diagnoses: Knowledge deficit related to the wound healing center program Goals: Patient/caregiver will verbalize understanding of the Wound Healing Center Program Date Initiated: 09/20/2019 Target Resolution Date: 09/24/2019 Goal Status: Active Interventions: Provide education  on orientation to the wound center Notes: Venous Leg Ulcer Nursing Diagnoses: Actual venous Insuffiency (use after diagnosis is confirmed) Knowledge deficit related to disease process and management Potential for venous Insuffiency (use before diagnosis confirmed) Goals: Non-invasive venous studies are completed as ordered Date Initiated: 11/16/2019 Target Resolution Date: 11/23/2019 Goal Status: Active Patient will maintain optimal edema control Date Initiated: 11/16/2019 Target Resolution Date: 11/16/2019 Goal Status: Active Patient/caregiver will verbalize understanding of disease process and disease management Date Initiated: 11/16/2019 Target Resolution Date: 11/16/2019 Goal Status: Active Interventions: Compression as  ordered Notes: Wound/Skin Impairment Nursing Diagnoses: Impaired tissue integrity Knowledge deficit related to ulceration/compromised skin integrity Goals: Patient/caregiver will verbalize understanding of skin care regimen Date Initiated: 09/20/2019 Target Resolution Date: 10/01/2019 Goal Status: Active Interventions: Assess patient/caregiver ability to obtain necessary supplies Assess patient/caregiver ability to perform ulcer/skin care regimen upon admission and as needed Assess ulceration(s) every visit Provide education on ulcer and skin care HEMAN, QUE ([id number]) Treatment Activities: Skin care regimen initiated : 09/20/2019 Topical wound management initiated : 09/20/2019 Notes: Electronic Signature(s) Signed: 11/23/2019 1:50:49 PM By: Phillis Haggis, Dondra Prader Entered By: Phillis Haggis, Dondra Prader on 11/23/2019 13:07:31 Jeffrey Lin ([id number]) -------------------------------------------------------------------------------- Pain Assessment Details Patient Name: Jeffrey Lin Date of Service: 11/23/2019 12:30 PM Medical Record Number: [id number] Patient Account Number: [id number] Date of Birth/Sex: [birth year] (44 y.o. M) Treating RN: Rogers Blocker Primary Care Keivon Garden: Kerby Nora Other Clinician: Referring Carisa Backhaus: Kerby Nora Treating Willa Brocks/Extender: Rowan Blase in Treatment: 9 Active Problems Location of Pain Severity and Description of Pain Patient Has Paino No Site Locations Rate the pain. Current Pain Level: 0 Pain Management and Medication Current Pain Management: Electronic Signature(s) Signed: 11/23/2019 1:50:49 PM By: Phillis Haggis, Dondra Prader Entered By: Phillis Haggis, Dondra Prader on 11/23/2019 12:48:03 Jeffrey Lin ([id number]) -------------------------------------------------------------------------------- Patient/Caregiver Education Details Patient Name: Jeffrey Lin Date of Service: 11/23/2019 12:30 PM Medical Record Number:  [id number] Patient Account Number: [id number] Date of Birth/Gender: 1975/12/10 (44 y.o. M) Treating RN: Rogers Blocker Primary Care Physician: Kerby Nora Other Clinician: Referring Physician: Kerby Nora Treating Physician/Extender: Rowan Blase in Treatment: 9 Education Assessment Education Provided To: Patient Education Topics Provided Venous: Handouts: Controlling Swelling with Compression Stockings Methods: Explain/Verbal Responses: State content correctly Electronic Signature(s) Signed: 11/23/2019 1:50:49 PM By: Phillis Haggis, Dondra Prader Entered By: Phillis Haggis, Dondra Prader on 11/23/2019 13:23:39 Jeffrey Lin ([id number]) -------------------------------------------------------------------------------- Wound Assessment Details Patient Name: Jeffrey Lin Date of Service: 11/23/2019 12:30 PM Medical Record Number: [id number] Patient Account Number: [id number] Date of Birth/Sex: 1975/01/25 (44 y.o. M) Treating RN: Rogers Blocker Primary Care Franklin Clapsaddle: Kerby Nora Other Clinician: Referring Draper Gallon: Kerby Nora Treating Hiroto Saltzman/Extender: Allen Derry Weeks in Treatment: 9 Wound Status Wound Number: 3 Primary Etiology: Venous Leg Ulcer Wound Location: Left, Medial, Anterior Lower Leg Wound Status: Open Wounding Event: Gradually Appeared Comorbid History: Hypertension Date Acquired: 11/09/2019 Weeks Of Treatment: 1 Clustered Wound: No Photos Wound Measurements Length: (cm) 0.1 Width: (cm) 0.1 Depth: (cm) 0.1 Area: (cm) 0.008 Volume: (cm) 0.001 % Reduction in Area: 99.1% % Reduction in Volume: 98.8% Epithelialization: Medium (34-66%) Tunneling: No Wound Description Classification: Partial Thickness Foul Odor After Cleansing: No Slough/Fibrino No Wound Bed Necrotic Amount: None Present (0%) Electronic Signature(s) Signed: 11/23/2019 1:50:49 PM By: Phillis Haggis, Dondra Prader Entered By: Phillis Haggis, Dondra Prader on 11/23/2019 13:18:58 Jeffrey Lin  ([id number]) -------------------------------------------------------------------------------- Wound Assessment Details Patient Name: Jeffrey Lin Date of Service: 11/23/2019 12:30 PM Medical Record Number: [id number] Patient Account Number: [id number] Date of Birth/Sex: 1975/10/16 (44 y.o. M)  Treating RN: Rogers Blocker Primary Care Hance Caspers: Kerby Nora Other Clinician: Referring Elonzo Sopp: Kerby Nora Treating Dakwan Pridgen/Extender: Allen Derry Weeks in Treatment: 9 Wound Status Wound Number: 4 Primary Etiology: Venous Leg Ulcer Wound Location: Left, Midline, Posterior Lower Leg Wound Status: Open Wounding Event: Gradually Appeared Comorbid History: Hypertension Date Acquired: 11/09/2019 Weeks Of Treatment: 1 Clustered Wound: No Photos Wound Measurements Length: (cm) 0.1 Width: (cm) 0.1 Depth: (cm) 0.1 Area: (cm) 0.008 Volume: (cm) 0.001 % Reduction in Area: 96.8% % Reduction in Volume: 96% Epithelialization: Small (1-33%) Tunneling: No Undermining: No Wound Description Classification: Partial Thickness Wound Bed Granulation Amount: Large (67-100%) Granulation Quality: Red Electronic Signature(s) Signed: 11/23/2019 1:50:49 PM By: Phillis Haggis, Dondra Prader Entered By: Phillis Haggis, Dondra Prader on 11/23/2019 13:18:59 Jeffrey Lin ([id number]) -------------------------------------------------------------------------------- Vitals Details Patient Name: Jeffrey Lin Date of Service: 11/23/2019 12:30 PM Medical Record Number: [id number] Patient Account Number: [id number] Date of Birth/Sex: 02-22-1975 (44 y.o. M) Treating RN: Rogers Blocker Primary Care Wilhelmine Krogstad: Kerby Nora Other Clinician: Referring Carrieanne Kleen: Kerby Nora Treating Philena Obey/Extender: Rowan Blase in Treatment: 9 Vital Signs Time Taken: 12:46 Temperature (F): 98.2 Height (in): 77 Pulse (bpm): 80 Weight (lbs): 265 Respiratory Rate (breaths/min): 20 Body Mass Index (BMI): 31.4 Blood  Pressure (mmHg): 158/74 Reference Range: 80 - 120 mg / dl Electronic Signature(s) Signed: 11/23/2019 1:50:49 PM By: Phillis Haggis, Dondra Prader Entered By: Phillis Haggis, Dondra Prader on 11/23/2019 12:47:26

## 2019-11-23 NOTE — Progress Notes (Addendum)
BETTIE, SWAVELY ([id number]) Visit Report for 11/23/2019 Chief Complaint Document Details Patient Name: Jeffrey Lin, Jeffrey Lin Date of Service: 11/23/2019 12:30 PM Medical Record Number: [id number] Patient Account Number: [id number] Date of Birth/Sex: Feb 05, 1975 (44 y.o. M) Treating RN: Huel Coventry Primary Care Provider: Kerby Nora Other Clinician: Referring Provider: Kerby Nora Treating Provider/Extender: Rowan Blase in Treatment: 9 Information Obtained from: Patient Chief Complaint Bilateral LE Lymphedema Electronic Signature(s) Signed: 11/23/2019 1:10:53 PM By: Lenda Kelp PA-C Entered By: Lenda Kelp on 11/23/2019 13:10:52 Jeffrey Lin ([id number]) -------------------------------------------------------------------------------- HPI Details Patient Name: Jeffrey Lin Date of Service: 11/23/2019 12:30 PM Medical Record Number: [id number] Patient Account Number: [id number] Date of Birth/Sex: [birth year] (44 y.o. M) Treating RN: Huel Coventry Primary Care Provider: Kerby Nora Other Clinician: Referring Provider: Kerby Nora Treating Provider/Extender: Rowan Blase in Treatment: 9 History of Present Illness HPI Description: 09/20/2019 upon evaluation today patient presents for initial evaluation in our clinic concerning issues that he has been having with wounds over the bilateral lower extremities. Both legs seem to be fairly equally affected at this point based on what I am seeing. With that being said the patient also appears to be having some drainage which is somewhat blue/green in nature and does have me concerned about the possibility of infection. I explained this often can indicate a Pseudomonas infection despite the fact that the patient has been on antibiotics currently this could still be problematic. He currently does not have home health that is something that we did discussed the potential for looking into. I think that could be beneficial for him to  be honest. The patient seems somewhat frustrated with the situation in general. He does have a history of lymphedema, hypertension, and he does ambulate and walk though not a lot and tells me that he really does not drive unless is absolutely forced for something he absolutely has to do otherwise he is essentially homebound at this point. He did have a believe his mother with him today. 09/28/2019 on evaluation today patient appears to be doing better in regard to his leg ulcers at this point. The antibiotic does seem to be helping and overall he tells me that his pain is less though he still is having discomfort. Fortunately there is no signs of active infection at this time. 10/04/2019 upon evaluation today patient appears to be doing well at this point in regard to his wounds. He in fact is improving dramatically from the standpoint of his infection which I think is under great control. I see no evidence of infection systemically at this point and overall I am extremely pleased I think he is getting close to resolution to be honest though he still has several scattered areas at this point. 10/12/2019 upon evaluation today patient actually seems to be making excellent progress in regard to his leg ulcers at this point. He just has a small area on the left posterior and a small area on the right anterior leg that are still open. Everything else has completely resolved and seems to have done excellent up to this point. 10/19/2019 upon evaluation today patient actually appears to be doing much better on the right leg with regard to the open wounds in fact there is nothing remaining at this point which is great news. Overall I am very pleased with where things stand. I do believe that the compression has helped he still has edema which is quite significant I still believe lymphedema pumps would be beneficial for  him. With regard to the left leg he still has again the edema and this is nonetheless quite  significant despite compression therapy but still I do believe that he is doing much better with the compression I do believe again lymphedema pumps would be of benefit in this regard as well. He has been trying to elevate and move around/exercise much as possible but nonetheless I still think that the compression which he does have his compression socks today as well as lymphedema pumps would greatly benefit him as far as preventing future ulcerations/blisters that would lead to further expensive wound care visits. 10/25/2019 on evaluation today patient appears to be doing excellent in regard to his legs bilaterally. In fact there is only one small area on the posterior aspect of his left leg that appears to be an issue at this time. Fortunately there is no signs of active infection which is great news. No fevers, chills, nausea, vomiting, or diarrhea. I do believe that for all intents purposes this is completely healed I think it would be appropriate for [place] to make a referral to the lymphedema clinic for him at this point. 11/02/2019 on evaluation today patient appears to be doing well with regard to his legs bilaterally. I do not see anything that remains open at this point he still has swelling but he does have new compression socks that he got as well. Fortunately there is no signs of active infection at this time which is great news. We did make a referral to lymphedema clinic he has not heard from them yet but I did recommend him giving them a call and we can give him the number for that today. 11/16/2019 upon evaluation today patient appears to be doing well with regard to his right leg. Unfortunately his left leg has started to weep and drain again he had a couple blister areas that are of concern at this point as well. Subsequently I do believe that the patient would benefit from likely reinitiation of compression wrapping of the left leg based on what I am seeing currently. Fortunately there  is no signs of active infection at this time. 11/23/2019 upon evaluation today patient actually appears to be making some good progress in regard to his lower extremities. Fortunately there is no evidence of active infection which is great news and overall I am extremely pleased with where things stand today. The patient may still have a couple areas that are leaking on the left leg but in general he has improved dramatically. Of note he did receive his lymphedema pumps this week and is supposed to have somebody coming out to show him how to use them and get them set up for him later this week. Electronic Signature(s) Signed: 11/23/2019 3:54:22 PM By: Lenda Kelp PA-C Entered By: Lenda Kelp on 11/23/2019 15:54:22 Jeffrey Lin, Jeffrey Lin ([id number]) -------------------------------------------------------------------------------- Physical Exam Details Patient Name: Jeffrey Lin Date of Service: 11/23/2019 12:30 PM Medical Record Number: [id number] Patient Account Number: [id number] Date of Birth/Sex: 07-28-1975 (44 y.o. M) Treating RN: Huel Coventry Primary Care Provider: Kerby Nora Other Clinician: Referring Provider: Kerby Nora Treating Provider/Extender: Rowan Blase in Treatment: 9 Constitutional Obese and well-hydrated in no acute distress. Respiratory normal breathing without difficulty. Psychiatric this patient is able to make decisions and demonstrates good insight into disease process. Alert and Oriented x 3. pleasant and cooperative. Notes Upon inspection patient's wound bed actually showed signs of good granulation at this time and excellent epithelization in general.  There is just a couple small areas that are still open and leaking this is minimal and overall I think he is very close to complete resolution yet again. I do want him to continue to bring his compression sock with him especially next visit as he may be ready to transition here. Electronic  Signature(s) Signed: 11/23/2019 3:54:45 PM By: Lenda Kelp PA-C Entered By: Lenda Kelp on 11/23/2019 15:54:44 Jeffrey Lin ([id number]) -------------------------------------------------------------------------------- Physician Orders Details Patient Name: Jeffrey Lin Date of Service: 11/23/2019 12:30 PM Medical Record Number: [id number] Patient Account Number: [id number] Date of Birth/Sex: [birth year] (44 y.o. M) Treating RN: Rogers Blocker Primary Care Provider: Kerby Nora Other Clinician: Referring Provider: Kerby Nora Treating Provider/Extender: Rowan Blase in Treatment: 9 Verbal / Phone Orders: No Diagnosis Coding ICD-10 Coding Code Description I89.0 Lymphedema, not elsewhere classified L97.821 Non-pressure chronic ulcer of other part of left lower leg limited to breakdown of skin L97.811 Non-pressure chronic ulcer of other part of right lower leg limited to breakdown of skin I10 Essential (primary) hypertension Primary Wound Dressing Wound #3 Left,Medial,Anterior Lower Leg o Silver Alginate Wound #4 Left,Midline,Posterior Lower Leg o Silver Alginate Secondary Dressing Wound #3 Left,Medial,Anterior Lower Leg o ABD pad Wound #4 Left,Midline,Posterior Lower Leg o ABD pad Edema Control o 4-Layer Compression System - Left Lower Extremity. o Unna Boot to Left Lower Extremity Notes scell, abd Electronic Signature(s) Signed: 11/23/2019 1:50:49 PM By: Phillis Haggis, Dondra Prader Signed: 11/23/2019 4:38:36 PM By: Lenda Kelp PA-C Entered By: Phillis Haggis, Dondra Prader on 11/23/2019 13:22:32 Jeffrey Lin, Jeffrey Lin ([id number]) -------------------------------------------------------------------------------- Problem List Details Patient Name: Jeffrey Lin Date of Service: 11/23/2019 12:30 PM Medical Record Number: [id number] Patient Account Number: [id number] Date of Birth/Sex: 25-Dec-1975 (44 y.o. M) Treating RN: Huel Coventry Primary Care Provider:  Kerby Nora Other Clinician: Referring Provider: Kerby Nora Treating Provider/Extender: Rowan Blase in Treatment: 9 Active Problems ICD-10 Encounter Code Description Active Date MDM Diagnosis I89.0 Lymphedema, not elsewhere classified 09/20/2019 No Yes L97.821 Non-pressure chronic ulcer of other part of left lower leg limited to 09/20/2019 No Yes breakdown of skin L97.811 Non-pressure chronic ulcer of other part of right lower leg limited to 09/20/2019 No Yes breakdown of skin I10 Essential (primary) hypertension 09/20/2019 No Yes Inactive Problems Resolved Problems Electronic Signature(s) Signed: 11/23/2019 1:10:46 PM By: Lenda Kelp PA-C Entered By: Lenda Kelp on 11/23/2019 13:10:46 Jeffrey Lin, Jeffrey Lin ([id number]) -------------------------------------------------------------------------------- Progress Note Details Patient Name: Jeffrey Lin Date of Service: 11/23/2019 12:30 PM Medical Record Number: [id number] Patient Account Number: [id number] Date of Birth/Sex: 07-24-1975 (44 y.o. M) Treating RN: Huel Coventry Primary Care Provider: Kerby Nora Other Clinician: Referring Provider: Kerby Nora Treating Provider/Extender: Rowan Blase in Treatment: 9 Subjective Chief Complaint Information obtained from Patient Bilateral LE Lymphedema History of Present Illness (HPI) 09/20/2019 upon evaluation today patient presents for initial evaluation in our clinic concerning issues that he has been having with wounds over the bilateral lower extremities. Both legs seem to be fairly equally affected at this point based on what I am seeing. With that being said the patient also appears to be having some drainage which is somewhat blue/green in nature and does have me concerned about the possibility of infection. I explained this often can indicate a Pseudomonas infection despite the fact that the patient has been on antibiotics currently this could still be problematic. He  currently does not have home health that is something that we did discussed the potential for looking into. I  think that could be beneficial for him to be honest. The patient seems somewhat frustrated with the situation in general. He does have a history of lymphedema, hypertension, and he does ambulate and walk though not a lot and tells me that he really does not drive unless is absolutely forced for something he absolutely has to do otherwise he is essentially homebound at this point. He did have a believe his mother with him today. 09/28/2019 on evaluation today patient appears to be doing better in regard to his leg ulcers at this point. The antibiotic does seem to be helping and overall he tells me that his pain is less though he still is having discomfort. Fortunately there is no signs of active infection at this time. 10/04/2019 upon evaluation today patient appears to be doing well at this point in regard to his wounds. He in fact is improving dramatically from the standpoint of his infection which I think is under great control. I see no evidence of infection systemically at this point and overall I am extremely pleased I think he is getting close to resolution to be honest though he still has several scattered areas at this point. 10/12/2019 upon evaluation today patient actually seems to be making excellent progress in regard to his leg ulcers at this point. He just has a small area on the left posterior and a small area on the right anterior leg that are still open. Everything else has completely resolved and seems to have done excellent up to this point. 10/19/2019 upon evaluation today patient actually appears to be doing much better on the right leg with regard to the open wounds in fact there is nothing remaining at this point which is great news. Overall I am very pleased with where things stand. I do believe that the compression has helped he still has edema which is quite significant I  still believe lymphedema pumps would be beneficial for him. With regard to the left leg he still has again the edema and this is nonetheless quite significant despite compression therapy but still I do believe that he is doing much better with the compression I do believe again lymphedema pumps would be of benefit in this regard as well. He has been trying to elevate and move around/exercise much as possible but nonetheless I still think that the compression which he does have his compression socks today as well as lymphedema pumps would greatly benefit him as far as preventing future ulcerations/blisters that would lead to further expensive wound care visits. 10/25/2019 on evaluation today patient appears to be doing excellent in regard to his legs bilaterally. In fact there is only one small area on the posterior aspect of his left leg that appears to be an issue at this time. Fortunately there is no signs of active infection which is great news. No fevers, chills, nausea, vomiting, or diarrhea. I do believe that for all intents purposes this is completely healed I think it would be appropriate for us to make a referral to the lymphedema clinic for him at this point. 11/02/2019 on evaluation today patient appears to be doing well with regard to his legs bilaterally. I do not see anything that remains open at this point he still has swelling but he does have new compression socks that he got as well. Fortunately there is no signs of active infection at this time which is great news. We did make a referral to lymphedema clinic he has not heard from them  yet but I did recommend him giving them a call and we can give him the number for that today. 11/16/2019 upon evaluation today patient appears to be doing well with regard to his right leg. Unfortunately his left leg has started to weep and drain again he had a couple blister areas that are of concern at this point as well. Subsequently I do believe  that the patient would benefit from likely reinitiation of compression wrapping of the left leg based on what I am seeing currently. Fortunately there is no signs of active infection at this time. 11/23/2019 upon evaluation today patient actually appears to be making some good progress in regard to his lower extremities. Fortunately there is no evidence of active infection which is great news and overall I am extremely pleased with where things stand today. The patient may still have a couple areas that are leaking on the left leg but in general he has improved dramatically. Of note he did receive his lymphedema pumps this week and is supposed to have somebody coming out to show him how to use them and get them set up for him later this week. Objective Jeffrey Lin, Jeffrey G. ([id number]) Constitutional Obese and well-hydrated in no acute distress. Vitals Time Taken: 12:46 PM, Height: 77 in, Weight: 265 lbs, BMI: 31.4, Temperature: 98.2 F, Pulse: 80 bpm, Respiratory Rate: 20 breaths/min, Blood Pressure: 158/74 mmHg. Respiratory normal breathing without difficulty. Psychiatric this patient is able to make decisions and demonstrates good insight into disease process. Alert and Oriented x 3. pleasant and cooperative. General Notes: Upon inspection patient's wound bed actually showed signs of good granulation at this time and excellent epithelization in general. There is just a couple small areas that are still open and leaking this is minimal and overall I think he is very close to complete resolution yet again. I do want him to continue to bring his compression sock with him especially next visit as he may be ready to transition here. Integumentary (Hair, Skin) Wound #3 status is Open. Original cause of wound was Gradually Appeared. The wound is located on the Left,Medial,Anterior Lower Leg. The wound measures 0.1cm length x 0.1cm width x 0.1cm depth; 0.008cm^2 area and 0.001cm^3 volume. There is no  tunneling noted. There is no necrotic tissue within the wound bed. Wound #4 status is Open. Original cause of wound was Gradually Appeared. The wound is located on the Left,Midline,Posterior Lower Leg. The wound measures 0.1cm length x 0.1cm width x 0.1cm depth; 0.008cm^2 area and 0.001cm^3 volume. There is no tunneling or undermining noted. There is large (67-100%) red granulation within the wound bed. 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 Post procedure Diagnosis Wound #: Same as Pre-Procedure Plan Primary Wound Dressing: Wound #3 Left,Medial,Anterior Lower Leg: Silver Alginate Wound #4 Left,Midline,Posterior Lower Leg: Silver Alginate Secondary Dressing: Wound #3 Left,Medial,Anterior Lower Leg: ABD pad Wound #4 Left,Midline,Posterior Lower Leg: ABD pad Edema Control: 4-Layer Compression System - Left Lower Extremity. Unna Boot to Left Lower Extremity General Notes: scell, abd Jeffrey Lin, Jeffrey G. ([id number]) 1. I would recommend currently that we go and continue with wound care measures as before the patient is in agreement the plan this includes  the use of a 4-layer compression wrap to try to keep edema under good control. 2. We can use silver alginate over any open areas to try to also keep the areas nice and dry while the continue to toughen up as far as the newly closed skin is concerned. 3. He should continue to elevate his legs as well as use the lymphedema pumps on a regular basis the pump should be used twice a day that can be used over his wrap as well as over his compression socks I discussed both of those with him  today. We will see patient back for reevaluation in 1 week here in the clinic. If anything worsens or changes patient will contact our office for additional recommendations. Electronic Signature(s) Signed: 11/23/2019 3:55:30 PM By: Lenda Kelp PA-C Entered By: Lenda Kelp on 11/23/2019 15:55:29 Jeffrey Lin, Jeffrey Lin ([id number]) -------------------------------------------------------------------------------- SuperBill Details Patient Name: Jeffrey Lin Date of Service: 11/23/2019 Medical Record Number: [id number] Patient Account Number: [id number] Date of Birth/Sex: 06-19-1975 (44 y.o. M) Treating RN: Rogers Blocker Primary Care Provider: Kerby Nora Other Clinician: Referring Provider: Kerby Nora Treating Provider/Extender: Rowan Blase in Treatment: 9 Diagnosis Coding ICD-10 Codes Code Description I89.0 Lymphedema, not elsewhere classified L97.821 Non-pressure chronic ulcer of other part of left lower leg limited to breakdown of skin L97.811 Non-pressure chronic ulcer of other part of right lower leg limited to breakdown of skin I10 Essential (primary) hypertension Facility Procedures CPT4 Code: [id number] Description: (Facility Use Only) [phone] - APPLY MULTLAY COMPRS LWR LT LEG Modifier: Quantity: 1 Physician Procedures CPT4 Code: 4696295 Description: 99213 - WC PHYS LEVEL 3 - EST PT Modifier: Quantity: 1 CPT4 Code: Description: ICD-10 Diagnosis Description I89.0 Lymphedema, not elsewhere classified L97.821 Non-pressure chronic ulcer of other part of left lower leg limited to brea L97.811 Non-pressure chronic ulcer of other part of right lower leg limited to bre I10  Essential (primary) hypertension Modifier: kdown of skin akdown of skin Quantity: Electronic Signature(s) Signed: 11/23/2019 3:55:55 PM By: Lenda Kelp PA-C Previous Signature: 11/23/2019 1:50:49 PM Version By: Phillis Haggis, Dondra Prader Entered By: Lenda Kelp on 11/23/2019 15:55:54

## 2019-11-29 ENCOUNTER — Other Ambulatory Visit: Payer: Self-pay

## 2019-11-29 ENCOUNTER — Encounter: Payer: PPO | Admitting: Physician Assistant

## 2019-11-29 DIAGNOSIS — Z09 Encounter for follow-up examination after completed treatment for conditions other than malignant neoplasm: Secondary | ICD-10-CM | POA: Diagnosis not present

## 2019-11-29 DIAGNOSIS — L97822 Non-pressure chronic ulcer of other part of left lower leg with fat layer exposed: Secondary | ICD-10-CM | POA: Diagnosis not present

## 2019-11-29 DIAGNOSIS — I872 Venous insufficiency (chronic) (peripheral): Secondary | ICD-10-CM | POA: Diagnosis not present

## 2019-11-29 NOTE — Progress Notes (Signed)
Jeffrey Lin, Previn G. ([id number]) Visit Report for 11/29/2019 Arrival Information Details Patient Name: Jeffrey Lin, Jeffrey G. Date of Service: 11/29/2019 9:30 AM Medical Record Number: [id number] Patient Account Number: [id number] Date of Birth/Sex: 03/09/1975 (44 y.o. M) Treating RN: Rogers BlockerSanchez, Kenia Primary Care Danija Gosa: Kerby NoraBedsole, Amy Other Clinician: Referring Zeniah Briney: Kerby NoraBedsole, Amy Treating Casimira Sutphin/Extender: Rowan BlaseStone, Hoyt Weeks in Treatment: 10 Visit Information History Since Last Visit Pain Present Now: Yes Patient Arrived: Ambulatory Arrival Time: 09:18 Accompanied By: self Transfer Assistance: Manual Patient Identification Verified: Yes Secondary Verification Process Completed: Yes Patient Requires Transmission-Based Precautions: No Patient Has Alerts: No Electronic Signature(s) Signed: 11/29/2019 12:42:29 PM By: Phillis HaggisSanchez Pereyda, Dondra PraderKenia Entered By: Phillis HaggisSanchez Pereyda, Dondra PraderKenia on 11/29/2019 09:19:08 Jeffrey Lin, Christo G. ([id number]) -------------------------------------------------------------------------------- Compression Therapy Details Patient Name: Jeffrey Lin, Jeffrey G. Date of Service: 11/29/2019 9:30 AM Medical Record Number: [id number] Patient Account Number: [id number] Date of Birth/Sex: 10/16/1975 (44 y.o. M) Treating RN: Rogers BlockerSanchez, Kenia Primary Care Winnie Barsky: Kerby NoraBedsole, Amy Other Clinician: Referring Rennee Coyne: Kerby NoraBedsole, Amy Treating Kahli Fitzgerald/Extender: Allen DerryStone, Hoyt Weeks in Treatment: 10 Compression Therapy Performed for Wound Assessment: Wound #3 Left,Medial,Anterior Lower Leg Performed By: Ovidio Hangerlinician Sanchez, Kenia, RN Compression Type: Four Layer Pre Treatment ABI: 0.8 Post Procedure Diagnosis Same as Pre-procedure Electronic Signature(s) Signed: 11/29/2019 12:42:29 PM By: Phillis HaggisSanchez Pereyda, Dondra PraderKenia Entered By: Phillis HaggisSanchez Pereyda, Dondra PraderKenia on 11/29/2019 09:49:56 Jeffrey Lin, Alejos G. ([id number]) -------------------------------------------------------------------------------- Compression Therapy  Details Patient Name: Jeffrey Lin, Jeffrey G. Date of Service: 11/29/2019 9:30 AM Medical Record Number: [id number] Patient Account Number: [id number] Date of Birth/Sex: 09/16/1975 (44 y.o. M) Treating RN: Rogers BlockerSanchez, Kenia Primary Care Tina Temme: Kerby NoraBedsole, Amy Other Clinician: Referring Sung Parodi: Kerby NoraBedsole, Amy Treating Cameryn Schum/Extender: Allen DerryStone, Hoyt Weeks in Treatment: 10 Compression Therapy Performed for Wound Assessment: Wound #4 Left,Midline,Posterior Lower Leg Performed By: Ovidio Hangerlinician Sanchez, Kenia, RN Compression Type: Four Layer Pre Treatment ABI: 0.8 Post Procedure Diagnosis Same as Pre-procedure Electronic Signature(s) Signed: 11/29/2019 12:42:29 PM By: Phillis HaggisSanchez Pereyda, Dondra PraderKenia Entered By: Phillis HaggisSanchez Pereyda, Dondra PraderKenia on 11/29/2019 09:49:56 Jeffrey Lin, Ziad G. ([id number]) -------------------------------------------------------------------------------- Encounter Discharge Information Details Patient Name: Jeffrey Lin, Jeffrey G. Date of Service: 11/29/2019 9:30 AM Medical Record Number: [id number] Patient Account Number: [id number] Date of Birth/Sex: 01/13/1976 (44 y.o. M) Treating RN: Rogers BlockerSanchez, Kenia Primary Care Kashawn Manzano: Kerby NoraBedsole, Amy Other Clinician: Referring Shakirra Buehler: Kerby NoraBedsole, Amy Treating Thayne Cindric/Extender: Rowan BlaseStone, Hoyt Weeks in Treatment: 10 Encounter Discharge Information Items Discharge Condition: Stable Ambulatory Status: Ambulatory Discharge Destination: Home Transportation: Private Auto Accompanied By: self Schedule Follow-up Appointment: Yes Clinical Summary of Care: Electronic Signature(s) Signed: 11/29/2019 12:42:29 PM By: Phillis HaggisSanchez Pereyda, Dondra PraderKenia Entered By: Phillis HaggisSanchez Pereyda, Dondra PraderKenia on 11/29/2019 10:07:27 Jeffrey Lin, Louden G. ([id number]) -------------------------------------------------------------------------------- Lower Extremity Assessment Details Patient Name: Jeffrey Lin, Jeffrey G. Date of Service: 11/29/2019 9:30 AM Medical Record Number: [id number] Patient Account Number: [id number] Date of  Birth/Sex: 06/07/1975 (44 y.o. M) Treating RN: Rogers BlockerSanchez, Kenia Primary Care Tomiko Schoon: Kerby NoraBedsole, Amy Other Clinician: Referring Vikkie Goeden: Kerby NoraBedsole, Amy Treating Addalyn Speedy/Extender: Allen DerryStone, Hoyt Weeks in Treatment: 10 Edema Assessment Assessed: [Left: No] [Right: No] Edema: [Left: Yes] [Right: Yes] Calf Left: Right: Point of Measurement: 35 cm From Medial Instep 40.4 cm 43.5 cm Ankle Left: Right: Point of Measurement: 10 cm From Medial Instep 34.5 cm 34 cm Vascular Assessment Pulses: Dorsalis Pedis Palpable: [Left:Yes] [Right:No Yes] Electronic Signature(s) Signed: 11/29/2019 12:42:29 PM By: Phillis HaggisSanchez Pereyda, Dondra PraderKenia Entered By: Phillis HaggisSanchez Pereyda, Dondra PraderKenia on 11/29/2019 09:43:21 Tabron, Deeann SaintBOBBY G. ([id number]) -------------------------------------------------------------------------------- Multi Wound Chart Details Patient Name: Jeffrey Lin, Jeffrey G. Date of Service: 11/29/2019 9:30 AM Medical Record Number: [id number] Patient Account Number: [id number] Date of Birth/Sex: 05/25/1975 (44 y.o. M) Treating RN: Mordecai MaesSanchez,  Kenia Primary Care Hollie Wojahn: Kerby Nora Other Clinician: Referring Elexis Pollak: Kerby Nora Treating Reigna Ruperto/Extender: Rowan Blase in Treatment: 10 Vital Signs Height(in): 77 Pulse(bpm): 120 Weight(lbs): 265 Blood Pressure(mmHg): 149/101 Body Mass Index(BMI): 31 Temperature(F): 97.5 Respiratory Rate(breaths/min): 20 Photos: [N/A:N/A] Wound Location: Left, Medial, Anterior Lower Leg Left, Midline, Posterior Lower Leg N/A Wounding Event: Gradually Appeared Gradually Appeared N/A Primary Etiology: Venous Leg Ulcer Venous Leg Ulcer N/A Comorbid History: Hypertension Hypertension N/A Date Acquired: 11/09/2019 11/09/2019 N/A Weeks of Treatment: 1 1 N/A Wound Status: Open Open N/A Measurements L x W x D (cm) 0.1x0.1x0.1 0.1x0.1x0.1 N/A Area (cm) : 0.008 0.008 N/A Volume (cm) : 0.001 0.001 N/A % Reduction in Area: 99.10% 96.80% N/A % Reduction in Volume: 98.80% 96.00%  N/A Classification: Partial Thickness Partial Thickness N/A Exudate Amount: None Present Small N/A Exudate Type: N/A Serosanguineous N/A Exudate Color: N/A red, brown N/A Wound Margin: Flat and Intact Flat and Intact N/A Granulation Amount: N/A Medium (34-66%) N/A Granulation Quality: N/A Red N/A Necrotic Amount: N/A Small (1-33%) N/A Necrotic Tissue: N/A Eschar N/A Exposed Structures: Fascia: No Fascia: No N/A Fat Layer (Subcutaneous Tissue): Fat Layer (Subcutaneous Tissue): No No Tendon: No Tendon: No Muscle: No Muscle: No Joint: No Joint: No Bone: No Bone: No Epithelialization: Medium (34-66%) Medium (34-66%) N/A Treatment Notes Electronic Signature(s) Signed: 11/29/2019 12:42:29 PM By: Phillis Haggis, Dondra Prader Entered By: Phillis Haggis, Dondra Prader on 11/29/2019 09:47:38 Jeffrey Gillis ([id number]) -------------------------------------------------------------------------------- Multi-Disciplinary Care Plan Details Patient Name: Jeffrey Gillis Date of Service: 11/29/2019 9:30 AM Medical Record Number: [id number] Patient Account Number: [id number] Date of Birth/Sex: 08/07/1975 (44 y.o. M) Treating RN: Rogers Blocker Primary Care Tyshika Baldridge: Kerby Nora Other Clinician: Referring Braedyn Kauk: Kerby Nora Treating Lucky Trotta/Extender: Rowan Blase in Treatment: 10 Active Inactive Orientation to the Wound Care Program Nursing Diagnoses: Knowledge deficit related to the wound healing center program Goals: Patient/caregiver will verbalize understanding of the Wound Healing Center Program Date Initiated: 09/20/2019 Target Resolution Date: 09/24/2019 Goal Status: Active Interventions: Provide education on orientation to the wound center Notes: Venous Leg Ulcer Nursing Diagnoses: Actual venous Insuffiency (use after diagnosis is confirmed) Knowledge deficit related to disease process and management Potential for venous Insuffiency (use before diagnosis  confirmed) Goals: Non-invasive venous studies are completed as ordered Date Initiated: 11/16/2019 Target Resolution Date: 11/23/2019 Goal Status: Active Patient will maintain optimal edema control Date Initiated: 11/16/2019 Target Resolution Date: 11/16/2019 Goal Status: Active Patient/caregiver will verbalize understanding of disease process and disease management Date Initiated: 11/16/2019 Target Resolution Date: 11/16/2019 Goal Status: Active Interventions: Compression as ordered Notes: Wound/Skin Impairment Nursing Diagnoses: Impaired tissue integrity Knowledge deficit related to ulceration/compromised skin integrity Goals: Patient/caregiver will verbalize understanding of skin care regimen Date Initiated: 09/20/2019 Target Resolution Date: 10/01/2019 Goal Status: Active Interventions: Assess patient/caregiver ability to obtain necessary supplies Assess patient/caregiver ability to perform ulcer/skin care regimen upon admission and as needed Assess ulceration(s) every visit Provide education on ulcer and skin care BRIA, PORTALES ([id number]) Treatment Activities: Skin care regimen initiated : 09/20/2019 Topical wound management initiated : 09/20/2019 Notes: Electronic Signature(s) Signed: 11/29/2019 12:42:29 PM By: Phillis Haggis, Dondra Prader Entered By: Phillis Haggis, Dondra Prader on 11/29/2019 09:47:23 Jeffrey Gillis ([id number]) -------------------------------------------------------------------------------- Pain Assessment Details Patient Name: Jeffrey Gillis Date of Service: 11/29/2019 9:30 AM Medical Record Number: [id number] Patient Account Number: [id number] Date of Birth/Sex: 10-28-1975 (44 y.o. M) Treating RN: Rogers Blocker Primary Care Harmoney Sienkiewicz: Kerby Nora Other Clinician: Referring Nollie Shiflett: Kerby Nora Treating Tucker Minter/Extender: Rowan Blase in Treatment: 10 Active Problems  Location of Pain Severity and Description of Pain Patient Has Paino Yes Site  Locations Pain Location: Pain in Ulcers Rate the pain. Current Pain Level: 3 Character of Pain Describe the Pain: Burning Pain Management and Medication Current Pain Management: Electronic Signature(s) Signed: 11/29/2019 12:42:29 PM By: Phillis Haggis, Dondra Prader Entered By: Phillis Haggis, Dondra Prader on 11/29/2019 09:24:42 Jeffrey Gillis ([id number]) -------------------------------------------------------------------------------- Patient/Caregiver Education Details Patient Name: Jeffrey Gillis Date of Service: 11/29/2019 9:30 AM Medical Record Number: [id number] Patient Account Number: [id number] Date of Birth/Gender: Aug 14, 1975 (44 y.o. M) Treating RN: Rogers Blocker Primary Care Physician: Kerby Nora Other Clinician: Referring Physician: Kerby Nora Treating Physician/Extender: Rowan Blase in Treatment: 10 Education Assessment Education Provided To: Patient Education Topics Provided Venous: Handouts: Controlling Swelling with Multilayered Compression Wraps Methods: Demonstration, Explain/Verbal Responses: State content correctly Electronic Signature(s) Signed: 11/29/2019 12:42:29 PM By: Phillis Haggis, Dondra Prader Entered By: Phillis Haggis, Dondra Prader on 11/29/2019 09:51:53 Jeffrey Gillis ([id number]) -------------------------------------------------------------------------------- Wound Assessment Details Patient Name: Jeffrey Gillis Date of Service: 11/29/2019 9:30 AM Medical Record Number: [id number] Patient Account Number: [id number] Date of Birth/Sex: 08-14-1975 (44 y.o. M) Treating RN: Rogers Blocker Primary Care Laniece Hornbaker: Kerby Nora Other Clinician: Referring Joleene Burnham: Kerby Nora Treating Taggert Bozzi/Extender: Allen Derry Weeks in Treatment: 10 Wound Status Wound Number: 3 Primary Etiology: Venous Leg Ulcer Wound Location: Left, Medial, Anterior Lower Leg Wound Status: Open Wounding Event: Gradually Appeared Comorbid History: Hypertension Date Acquired:  11/09/2019 Weeks Of Treatment: 1 Clustered Wound: No Photos Wound Measurements Length: (cm) 0.1 Width: (cm) 0.1 Depth: (cm) 0.1 Area: (cm) 0.008 Volume: (cm) 0.001 % Reduction in Area: 99.1% % Reduction in Volume: 98.8% Epithelialization: Medium (34-66%) Tunneling: No Undermining: No Wound Description Classification: Partial Thickness Wound Margin: Flat and Intact Exudate Amount: None Present Foul Odor After Cleansing: No Slough/Fibrino No Wound Bed Necrotic Amount: None Present (0%) Exposed Structure Fascia Exposed: No Fat Layer (Subcutaneous Tissue) Exposed: No Tendon Exposed: No Muscle Exposed: No Joint Exposed: No Bone Exposed: No Treatment Notes Wound #3 (Left, Medial, Anterior Lower Leg) Notes TCA, sm silver alginate, ABD, 4 layer bilateral Electronic Signature(s) Signed: 11/29/2019 12:42:29 PM By: Phillis Haggis, Dondra Prader Entered By: Phillis Haggis, Dondra Prader on 11/29/2019 09:34:25 RONDALL, RADIGAN ([id number], CANCRO ([id number]) -------------------------------------------------------------------------------- Wound Assessment Details Patient Name: Jeffrey Gillis Date of Service: 11/29/2019 9:30 AM Medical Record Number: [id number] Patient Account Number: [id number] Date of Birth/Sex: [birth year] (43 y.o. M) Treating RN: Rogers Blocker Primary Care Joelys Staubs: Kerby Nora Other Clinician: Referring Jazmen Lindenbaum: Kerby Nora Treating Raniah Karan/Extender: Allen Derry Weeks in Treatment: 10 Wound Status Wound Number: 4 Primary Etiology: Venous Leg Ulcer Wound Location: Left, Midline, Posterior Lower Leg Wound Status: Open Wounding Event: Gradually Appeared Comorbid History: Hypertension Date Acquired: 11/09/2019 Weeks Of Treatment: 1 Clustered Wound: No Photos Wound Measurements Length: (cm) 0.1 Width: (cm) 0.1 Depth: (cm) 0.1 Area: (cm) 0.008 Volume: (cm) 0.001 % Reduction in Area: 96.8% % Reduction in Volume: 96% Epithelialization: Medium  (34-66%) Tunneling: No Undermining: No Wound Description Classification: Partial Thickness Wound Margin: Flat and Intact Exudate Amount: Small Exudate Type: Serosanguineous Exudate Color: red, brown Foul Odor After Cleansing: No Slough/Fibrino No Wound Bed Granulation Amount: Medium (34-66%) Exposed Structure Granulation Quality: Red Fascia Exposed: No Necrotic Amount: Small (1-33%) Fat Layer (Subcutaneous Tissue) Exposed: No Necrotic Quality: Eschar Tendon Exposed: No Muscle Exposed: No Joint Exposed: No Bone Exposed: No Treatment Notes Wound #4 (Left, Midline, Posterior Lower Leg) Notes TCA, sm silver alginate, ABD, 4 layer bilateral Electronic Signature(s) Signed: 11/29/2019 12:42:29 PM By:  [address] Galliano, AGUSTUS MANE ([id number]) Entered By: Phillis Haggis, Dondra Prader on 11/29/2019 09:35:51 JAMESYN, LINDELL ([id number]) -------------------------------------------------------------------------------- Vitals Details Patient Name: Jeffrey Gillis Date of Service: 11/29/2019 9:30 AM Medical Record Number: [id number] Patient Account Number: [id number] Date of Birth/Sex: [birth year] (44 y.o. M) Treating RN: Rogers Blocker Primary Care Yaretsi Humphres: Kerby Nora Other Clinician: Referring Issa Kosmicki: Kerby Nora Treating Maksym Pfiffner/Extender: Rowan Blase in Treatment: 10 Vital Signs Time Taken: 09:19 Temperature (F): 97.5 Height (in): 77 Pulse (bpm): 120 Weight (lbs): 265 Respiratory Rate (breaths/min): 20 Body Mass Index (BMI): 31.4 Blood Pressure (mmHg): 149/101 Reference Range: 80 - 120 mg / dl Electronic Signature(s) Signed: 11/29/2019 12:42:29 PM By: Phillis Haggis, Dondra Prader Entered By: Phillis Haggis, Dondra Prader on 11/29/2019 09:24:09

## 2019-11-29 NOTE — Progress Notes (Addendum)
Jeffrey Lin ([id number]) Visit Report for 11/29/2019 Chief Complaint Document Details Patient Name: Jeffrey Lin, Jeffrey Lin Date of Service: 11/29/2019 9:30 AM Medical Record Number: [id number] Patient Account Number: [id number] Date of Birth/Sex: 03-20-1975 (44 y.o. M) Treating RN: Huel Coventry Primary Care Provider: Kerby Nora Other Clinician: Referring Provider: Kerby Nora Treating Provider/Extender: Rowan Blase in Treatment: 10 Information Obtained from: Patient Chief Complaint Bilateral LE Lymphedema Electronic Signature(s) Signed: 11/29/2019 9:44:19 AM By: Lenda Kelp PA-C Entered By: Lenda Kelp on 11/29/2019 09:44:19 Jeffrey Lin ([id number]) -------------------------------------------------------------------------------- HPI Details Patient Name: Jeffrey Lin Date of Service: 11/29/2019 9:30 AM Medical Record Number: [id number] Patient Account Number: [id number] Date of Birth/Sex: 1975-08-07 (44 y.o. M) Treating RN: Huel Coventry Primary Care Provider: Kerby Nora Other Clinician: Referring Provider: Kerby Nora Treating Provider/Extender: Rowan Blase in Treatment: 10 History of Present Illness HPI Description: 09/20/2019 upon evaluation today patient presents for initial evaluation in our clinic concerning issues that he has been having with wounds over the bilateral lower extremities. Both legs seem to be fairly equally affected at this point based on what I am seeing. With that being said the patient also appears to be having some drainage which is somewhat blue/green in nature and does have me concerned about the possibility of infection. I explained this often can indicate a Pseudomonas infection despite the fact that the patient has been on antibiotics currently this could still be problematic. He currently does not have home health that is something that we did discussed the potential for looking into. I think that could be beneficial for him to  be honest. The patient seems somewhat frustrated with the situation in general. He does have a history of lymphedema, hypertension, and he does ambulate and walk though not a lot and tells me that he really does not drive unless is absolutely forced for something he absolutely has to do otherwise he is essentially homebound at this point. He did have a believe his mother with him today. 09/28/2019 on evaluation today patient appears to be doing better in regard to his leg ulcers at this point. The antibiotic does seem to be helping and overall he tells me that his pain is less though he still is having discomfort. Fortunately there is no signs of active infection at this time. 10/04/2019 upon evaluation today patient appears to be doing well at this point in regard to his wounds. He in fact is improving dramatically from the standpoint of his infection which I think is under great control. I see no evidence of infection systemically at this point and overall I am extremely pleased I think he is getting close to resolution to be honest though he still has several scattered areas at this point. 10/12/2019 upon evaluation today patient actually seems to be making excellent progress in regard to his leg ulcers at this point. He just has a small area on the left posterior and a small area on the right anterior leg that are still open. Everything else has completely resolved and seems to have done excellent up to this point. 10/19/2019 upon evaluation today patient actually appears to be doing much better on the right leg with regard to the open wounds in fact there is nothing remaining at this point which is great news. Overall I am very pleased with where things stand. I do believe that the compression has helped he still has edema which is quite significant I still believe lymphedema pumps would be beneficial for  him. With regard to the left leg he still has again the edema and this is nonetheless quite  significant despite compression therapy but still I do believe that he is doing much better with the compression I do believe again lymphedema pumps would be of benefit in this regard as well. He has been trying to elevate and move around/exercise much as possible but nonetheless I still think that the compression which he does have his compression socks today as well as lymphedema pumps would greatly benefit him as far as preventing future ulcerations/blisters that would lead to further expensive wound care visits. 10/25/2019 on evaluation today patient appears to be doing excellent in regard to his legs bilaterally. In fact there is only one small area on the posterior aspect of his left leg that appears to be an issue at this time. Fortunately there is no signs of active infection which is great news. No fevers, chills, nausea, vomiting, or diarrhea. I do believe that for all intents purposes this is completely healed I think it would be appropriate for [place] to make a referral to the lymphedema clinic for him at this point. 11/02/2019 on evaluation today patient appears to be doing well with regard to his legs bilaterally. I do not see anything that remains open at this point he still has swelling but he does have new compression socks that he got as well. Fortunately there is no signs of active infection at this time which is great news. We did make a referral to lymphedema clinic he has not heard from them yet but I did recommend him giving them a call and we can give him the number for that today. 11/16/2019 upon evaluation today patient appears to be doing well with regard to his right leg. Unfortunately his left leg has started to weep and drain again he had a couple blister areas that are of concern at this point as well. Subsequently I do believe that the patient would benefit from likely reinitiation of compression wrapping of the left leg based on what I am seeing currently. Fortunately there  is no signs of active infection at this time. 11/23/2019 upon evaluation today patient actually appears to be making some good progress in regard to his lower extremities. Fortunately there is no evidence of active infection which is great news and overall I am extremely pleased with where things stand today. The patient may still have a couple areas that are leaking on the left leg but in general he has improved dramatically. Of note he did receive his lymphedema pumps this week and is supposed to have somebody coming out to show him how to use them and get them set up for him later this week. 11/29/2019 upon evaluation today patient appears to be doing very well in regard to his leg ulcers. In fact the left leg and the right leg both appear to be for the most part closed his right leg is a little bit more swollen and again we have not been wrapping this so that may be the reason. Nonetheless I do believe that the patient may benefit from wrapping both legs 1 more week just to make sure nothing reopens even though it seems to be doing quite well I am hopeful he will be ready for discharge at that point next week. Electronic Signature(s) Signed: 11/29/2019 11:12:51 AM By: Lenda Kelp PA-C Entered By: Lenda Kelp on 11/29/2019 11:12:51 Pehl, Deeann Saint ([id number]) -------------------------------------------------------------------------------- Physical Exam Details Patient Name:  Jeffrey Lin Date of Service: 11/29/2019 9:30 AM Medical Record Number: [id number] Patient Account Number: [id number] Date of Birth/Sex: [birth year] (44 y.o. M) Treating RN: Huel Coventry Primary Care Provider: Kerby Nora Other Clinician: Referring Provider: Kerby Nora Treating Provider/Extender: Rowan Blase in Treatment: 10 Constitutional Obese and well-hydrated in no acute distress. Respiratory normal breathing without difficulty. Psychiatric this patient is able to make decisions and demonstrates  good insight into disease process. Alert and Oriented x 3. pleasant and cooperative. Notes Upon inspection patient's wound bed actually showed signs of excellent epithelization at all sites I really do not see anything that appears to be open but being that this is all nearly closed and we want to try to prevent this from reopening yet again and would recommend 1 additional week of wrapping the patient's leg. Electronic Signature(s) Signed: 11/29/2019 11:13:10 AM By: Lenda Kelp PA-C Entered By: Lenda Kelp on 11/29/2019 11:13:10 Jeffrey Lin ([id number]) -------------------------------------------------------------------------------- Physician Orders Details Patient Name: Jeffrey Lin Date of Service: 11/29/2019 9:30 AM Medical Record Number: [id number] Patient Account Number: [id number] Date of Birth/Sex: [birth year] (44 y.o. M) Treating RN: Rogers Blocker Primary Care Provider: Kerby Nora Other Clinician: Referring Provider: Kerby Nora Treating Provider/Extender: Rowan Blase in Treatment: 10 Verbal / Phone Orders: No Diagnosis Coding ICD-10 Coding Code Description I89.0 Lymphedema, not elsewhere classified L97.821 Non-pressure chronic ulcer of other part of left lower leg limited to breakdown of skin L97.811 Non-pressure chronic ulcer of other part of right lower leg limited to breakdown of skin I10 Essential (primary) hypertension Wound Cleansing Wound #3 Left,Medial,Anterior Lower Leg o Cleanse wound with mild soap and water o Dial antibacterial soap, wash wounds, rinse and pat dry prior to dressing wounds Wound #4 Left,Midline,Posterior Lower Leg o Cleanse wound with mild soap and water o Dial antibacterial soap, wash wounds, rinse and pat dry prior to dressing wounds Skin Barriers/Peri-Wound Care Wound #3 Left,Medial,Anterior Lower Leg o Triamcinolone Acetonide Ointment (TCA) Wound #4 Left,Midline,Posterior Lower Leg o Triamcinolone  Acetonide Ointment (TCA) Primary Wound Dressing Wound #3 Left,Medial,Anterior Lower Leg o Silver Alginate Wound #4 Left,Midline,Posterior Lower Leg o Silver Alginate Secondary Dressing Wound #3 Left,Medial,Anterior Lower Leg o ABD pad Wound #4 Left,Midline,Posterior Lower Leg o ABD pad Dressing Change Frequency Wound #3 Left,Medial,Anterior Lower Leg o Change dressing every week Wound #4 Left,Midline,Posterior Lower Leg o Change dressing every week Follow-up Appointments Wound #3 Left,Medial,Anterior Lower Leg o Return Appointment in 1 week. o Nurse Visit as needed Wound #4 Left,Midline,Posterior Lower Leg o Return Appointment in 1 week. o Nurse Visit as needed ZEFERINO, MOUNTS ([id number]) Edema Control Wound #3 Left,Medial,Anterior Lower Leg o 4 Layer Compression System - Bilateral Wound #4 Left,Midline,Posterior Lower Leg o 4 Layer Compression System - Bilateral Electronic Signature(s) Signed: 11/29/2019 4:22:48 PM By: Lenda Kelp PA-C Entered By: Lenda Kelp on 11/29/2019 11:15:44 Summey, Deeann Saint ([id number]) -------------------------------------------------------------------------------- Problem List Details Patient Name: Jeffrey Lin Date of Service: 11/29/2019 9:30 AM Medical Record Number: [id number] Patient Account Number: [id number] Date of Birth/Sex: 1975-03-05 (44 y.o. M) Treating RN: Huel Coventry Primary Care Provider: Kerby Nora Other Clinician: Referring Provider: Kerby Nora Treating Provider/Extender: Rowan Blase in Treatment: 10 Active Problems ICD-10 Encounter Code Description Active Date MDM Diagnosis I89.0 Lymphedema, not elsewhere classified 09/20/2019 No Yes L97.821 Non-pressure chronic ulcer of other part of left lower leg limited to 09/20/2019 No Yes breakdown of skin L97.811 Non-pressure chronic ulcer of other part of right lower leg limited to 09/20/2019  No Yes breakdown of skin I10 Essential  (primary) hypertension 09/20/2019 No Yes Inactive Problems Resolved Problems Electronic Signature(s) Signed: 11/29/2019 9:44:13 AM By: Lenda Kelp PA-C Entered By: Lenda Kelp on 11/29/2019 09:44:12 Yi, Deeann Saint ([id number]) -------------------------------------------------------------------------------- Progress Note Details Patient Name: Jeffrey Lin Date of Service: 11/29/2019 9:30 AM Medical Record Number: [id number] Patient Account Number: [id number] Date of Birth/Sex: March 13, 1975 (44 y.o. M) Treating RN: Huel Coventry Primary Care Provider: Kerby Nora Other Clinician: Referring Provider: Kerby Nora Treating Provider/Extender: Rowan Blase in Treatment: 10 Subjective Chief Complaint Information obtained from Patient Bilateral LE Lymphedema History of Present Illness (HPI) 09/20/2019 upon evaluation today patient presents for initial evaluation in our clinic concerning issues that he has been having with wounds over the bilateral lower extremities. Both legs seem to be fairly equally affected at this point based on what I am seeing. With that being said the patient also appears to be having some drainage which is somewhat blue/green in nature and does have me concerned about the possibility of infection. I explained this often can indicate a Pseudomonas infection despite the fact that the patient has been on antibiotics currently this could still be problematic. He currently does not have home health that is something that we did discussed the potential for looking into. I think that could be beneficial for him to be honest. The patient seems somewhat frustrated with the situation in general. He does have a history of lymphedema, hypertension, and he does ambulate and walk though not a lot and tells me that he really does not drive unless is absolutely forced for something he absolutely has to do otherwise he is essentially homebound at this point. He did have a  believe his mother with him today. 09/28/2019 on evaluation today patient appears to be doing better in regard to his leg ulcers at this point. The antibiotic does seem to be helping and overall he tells me that his pain is less though he still is having discomfort. Fortunately there is no signs of active infection at this time. 10/04/2019 upon evaluation today patient appears to be doing well at this point in regard to his wounds. He in fact is improving dramatically from the standpoint of his infection which I think is under great control. I see no evidence of infection systemically at this point and overall I am extremely pleased I think he is getting close to resolution to be honest though he still has several scattered areas at this point. 10/12/2019 upon evaluation today patient actually seems to be making excellent progress in regard to his leg ulcers at this point. He just has a small area on the left posterior and a small area on the right anterior leg that are still open. Everything else has completely resolved and seems to have done excellent up to this point. 10/19/2019 upon evaluation today patient actually appears to be doing much better on the right leg with regard to the open wounds in fact there is nothing remaining at this point which is great news. Overall I am very pleased with where things stand. I do believe that the compression has helped he still has edema which is quite significant I still believe lymphedema pumps would be beneficial for him. With regard to the left leg he still has again the edema and this is nonetheless quite significant despite compression therapy but still I do believe that he is doing much better with the compression I do believe again lymphedema pumps would  be of benefit in this regard as well. He has been trying to elevate and move around/exercise much as possible but nonetheless I still think that the compression which he does have his compression socks  today as well as lymphedema pumps would greatly benefit him as far as preventing future ulcerations/blisters that would lead to further expensive wound care visits. 10/25/2019 on evaluation today patient appears to be doing excellent in regard to his legs bilaterally. In fact there is only one small area on the posterior aspect of his left leg that appears to be an issue at this time. Fortunately there is no signs of active infection which is great news. No fevers, chills, nausea, vomiting, or diarrhea. I do believe that for all intents purposes this is completely healed I think it would be appropriate for [place] to make a referral to the lymphedema clinic for him at this point. 11/02/2019 on evaluation today patient appears to be doing well with regard to his legs bilaterally. I do not see anything that remains open at this point he still has swelling but he does have new compression socks that he got as well. Fortunately there is no signs of active infection at this time which is great news. We did make a referral to lymphedema clinic he has not heard from them yet but I did recommend him giving them a call and we can give him the number for that today. 11/16/2019 upon evaluation today patient appears to be doing well with regard to his right leg. Unfortunately his left leg has started to weep and drain again he had a couple blister areas that are of concern at this point as well. Subsequently I do believe that the patient would benefit from likely reinitiation of compression wrapping of the left leg based on what I am seeing currently. Fortunately there is no signs of active infection at this time. 11/23/2019 upon evaluation today patient actually appears to be making some good progress in regard to his lower extremities. Fortunately there is no evidence of active infection which is great news and overall I am extremely pleased with where things stand today. The patient may still have a couple areas  that are leaking on the left leg but in general he has improved dramatically. Of note he did receive his lymphedema pumps this week and is supposed to have somebody coming out to show him how to use them and get them set up for him later this week. 11/29/2019 upon evaluation today patient appears to be doing very well in regard to his leg ulcers. In fact the left leg and the right leg both appear to be for the most part closed his right leg is a little bit more swollen and again we have not been wrapping this so that may be the reason. Nonetheless I do believe that the patient may benefit from wrapping both legs 1 more week just to make sure nothing reopens even though it seems to be doing quite well I am hopeful he will be ready for discharge at that point next week. HORTON, ELLITHORPE ([id number]) Objective Constitutional Obese and well-hydrated in no acute distress. Vitals Time Taken: 9:19 AM, Height: 77 in, Weight: 265 lbs, BMI: 31.4, Temperature: 97.5 F, Pulse: 120 bpm, Respiratory Rate: 20 breaths/min, Blood Pressure: 149/101 mmHg. Respiratory normal breathing without difficulty. Psychiatric this patient is able to make decisions and demonstrates good insight into disease process. Alert and Oriented x 3. pleasant and cooperative. General Notes: Upon inspection  patient's wound bed actually showed signs of excellent epithelization at all sites I really do not see anything that appears to be open but being that this is all nearly closed and we want to try to prevent this from reopening yet again and would recommend 1 additional week of wrapping the patient's leg. Integumentary (Hair, Skin) Wound #3 status is Open. Original cause of wound was Gradually Appeared. The wound is located on the Left,Medial,Anterior Lower Leg. The wound measures 0.1cm length x 0.1cm width x 0.1cm depth; 0.008cm^2 area and 0.001cm^3 volume. There is no tunneling or undermining noted. There is a none present amount of  drainage noted. The wound margin is flat and intact. There is no necrotic tissue within the wound bed. Wound #4 status is Open. Original cause of wound was Gradually Appeared. The wound is located on the Left,Midline,Posterior Lower Leg. The wound measures 0.1cm length x 0.1cm width x 0.1cm depth; 0.008cm^2 area and 0.001cm^3 volume. There is no tunneling or undermining noted. There is a small amount of serosanguineous drainage noted. The wound margin is flat and intact. There is medium (34-66%) red granulation within the wound bed. There is a small (1-33%) amount of necrotic tissue within the wound bed including Eschar. 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 There was a Four Layer Compression Therapy Procedure with a pre-treatment ABI of 0.8 by Rogers BlockerSanchez, Kenia, RN. 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 There was a Four Layer Compression Therapy Procedure with a pre-treatment ABI of 0.8 by Rogers BlockerSanchez, Kenia, RN. Post procedure Diagnosis Wound #4: Same as Pre-Procedure Plan Wound Cleansing: Wound #3 Left,Medial,Anterior Lower Leg: Cleanse wound with mild soap and water Locklin, Terryn G. ([id number]) Dial antibacterial soap, wash wounds, rinse and pat dry prior to dressing wounds Wound #4 Left,Midline,Posterior Lower Leg: Cleanse wound with mild soap and water Dial antibacterial soap, wash wounds, rinse and pat dry prior to dressing wounds Skin Barriers/Peri-Wound Care: Wound #3  Left,Medial,Anterior Lower Leg: Triamcinolone Acetonide Ointment (TCA) Wound #4 Left,Midline,Posterior Lower Leg: Triamcinolone Acetonide Ointment (TCA) Primary Wound Dressing: Wound #3 Left,Medial,Anterior Lower Leg: Silver Alginate Wound #4 Left,Midline,Posterior Lower Leg: Silver Alginate Secondary Dressing: Wound #3 Left,Medial,Anterior Lower Leg: ABD pad Wound #4 Left,Midline,Posterior Lower Leg: ABD pad Dressing Change Frequency: Wound #3 Left,Medial,Anterior Lower Leg: Change dressing every week Wound #4 Left,Midline,Posterior Lower Leg: Change dressing every week Follow-up Appointments: Wound #3 Left,Medial,Anterior Lower Leg: Return Appointment in 1 week. Nurse Visit as needed Wound #4 Left,Midline,Posterior Lower Leg: Return Appointment in 1 week. Nurse Visit as needed Edema Control: Wound #3 Left,Medial,Anterior Lower Leg: 4 Layer Compression System - Bilateral Wound #4 Left,Midline,Posterior Lower Leg: 4 Layer Compression System - Bilateral 1. Amount of suggest currently that we going to continue with the wound care measures as before and the patient is in agreement with that plan. Mainly recommend using triamcinolone and lotion to try to loosen up some of the dry skin and help with his itching. 2. I am also can recommend that we use the bilateral compression wraps and again were doing a 4-layer compression wrap bilaterally.  3. I Minna recommend the patient continue to elevate his legs much as possible try to keep edema under good control. We will see patient back for reevaluation in 1 week here in the clinic. If anything worsens or changes patient will contact our office for additional recommendations. Electronic Signature(s) Signed: 11/29/2019 11:16:31 AM By: Lenda Kelp PA-C Entered By: Lenda Kelp on 11/29/2019 11:16:31 Jeffrey Lin ([id number]) -------------------------------------------------------------------------------- SuperBill  Details Patient Name: Jeffrey Lin Date of Service: 11/29/2019 Medical Record Number: [id number] Patient Account Number: [id number] Date of Birth/Sex: May 24, 1975 (44 y.o. M) Treating RN: Rogers Blocker Primary Care Provider: Kerby Nora Other Clinician: Referring Provider: Kerby Nora Treating Provider/Extender: Rowan Blase in Treatment: 10 Diagnosis Coding ICD-10 Codes Code Description I89.0 Lymphedema, not elsewhere classified L97.821 Non-pressure chronic ulcer of other part of left lower leg limited to breakdown of skin L97.811 Non-pressure chronic ulcer of other part of right lower leg limited to breakdown of skin I10 Essential (primary) hypertension Facility Procedures CPT4: Description Modifier Quantity Code [id number] BILATERAL: Application of multi-layer venous compression system; leg (below knee), including 1 ankle and foot. 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

## 2019-12-07 ENCOUNTER — Other Ambulatory Visit: Payer: Self-pay

## 2019-12-07 ENCOUNTER — Encounter: Payer: PPO | Admitting: Physician Assistant

## 2019-12-07 DIAGNOSIS — I890 Lymphedema, not elsewhere classified: Secondary | ICD-10-CM | POA: Diagnosis not present

## 2019-12-07 DIAGNOSIS — Z09 Encounter for follow-up examination after completed treatment for conditions other than malignant neoplasm: Secondary | ICD-10-CM | POA: Diagnosis not present

## 2019-12-07 DIAGNOSIS — L97821 Non-pressure chronic ulcer of other part of left lower leg limited to breakdown of skin: Secondary | ICD-10-CM | POA: Diagnosis not present

## 2019-12-07 DIAGNOSIS — L97811 Non-pressure chronic ulcer of other part of right lower leg limited to breakdown of skin: Secondary | ICD-10-CM | POA: Diagnosis not present

## 2019-12-07 NOTE — Progress Notes (Addendum)
ETAN, VASUDEVAN ([id number]) Visit Report for 12/07/2019 Chief Complaint Document Details Patient Name: Jeffrey Lin, Jeffrey Lin Date of Service: 12/07/2019 4:15 PM Medical Record Number: [id number] Patient Account Number: [id number] Date of Birth/Sex: 17-Feb-1975 (44 y.o. M) Treating RN: Huel Coventry Primary Care Provider: Kerby Nora Other Clinician: Referring Provider: Kerby Nora Treating Provider/Extender: Rowan Blase in Treatment: 11 Information Obtained from: Patient Chief Complaint Bilateral LE Lymphedema Electronic Signature(s) Signed: 12/07/2019 4:42:43 PM By: Lenda Kelp PA-C Entered By: Lenda Kelp on 12/07/2019 16:42:43 TREVONTAE, LINDAHL ([id number]) -------------------------------------------------------------------------------- HPI Details Patient Name: [name] Date of Service: 12/07/2019 4:15 PM Medical Record Number: [id number] Patient Account Number: [id number] Date of Birth/Sex: 1975-05-21 (44 y.o. M) Treating RN: Huel Coventry Primary Care Provider: Kerby Nora Other Clinician: Referring Provider: Kerby Nora Treating Provider/Extender: Rowan Blase in Treatment: 11 History of Present Illness HPI Description: 09/20/2019 upon evaluation today patient presents for initial evaluation in our clinic concerning issues that he has been having with wounds over the bilateral lower extremities. Both legs seem to be fairly equally affected at this point based on what I am seeing. With that being said the patient also appears to be having some drainage which is somewhat blue/green in nature and does have me concerned about the possibility of infection. I explained this often can indicate a Pseudomonas infection despite the fact that the patient has been on antibiotics currently this could still be problematic. He currently does not have home health that is something that we did discussed the potential for looking into. I think that could be beneficial for him  to be honest. The patient seems somewhat frustrated with the situation in general. He does have a history of lymphedema, hypertension, and he does ambulate and walk though not a lot and tells me that he really does not drive unless is absolutely forced for something he absolutely has to do otherwise he is essentially homebound at this point. He did have a believe his mother with him today. 09/28/2019 on evaluation today patient appears to be doing better in regard to his leg ulcers at this point. The antibiotic does seem to be helping and overall he tells me that his pain is less though he still is having discomfort. Fortunately there is no signs of active infection at this time. 10/04/2019 upon evaluation today patient appears to be doing well at this point in regard to his wounds. He in fact is improving dramatically from the standpoint of his infection which I think is under great control. I see no evidence of infection systemically at this point and overall I am extremely pleased I think he is getting close to resolution to be honest though he still has several scattered areas at this point. 10/12/2019 upon evaluation today patient actually seems to be making excellent progress in regard to his leg ulcers at this point. He just has a small area on the left posterior and a small area on the right anterior leg that are still open. Everything else has completely resolved and seems to have done excellent up to this point. 10/19/2019 upon evaluation today patient actually appears to be doing much better on the right leg with regard to the open wounds in fact there is nothing remaining at this point which is great news. Overall I am very pleased with where things stand. I do believe that the compression has helped he still has edema which is quite significant I still believe lymphedema pumps would be beneficial for  him. With regard to the left leg he still has again the edema and this is nonetheless quite  significant despite compression therapy but still I do believe that he is doing much better with the compression I do believe again lymphedema pumps would be of benefit in this regard as well. He has been trying to elevate and move around/exercise much as possible but nonetheless I still think that the compression which he does have his compression socks today as well as lymphedema pumps would greatly benefit him as far as preventing future ulcerations/blisters that would lead to further expensive wound care visits. 10/25/2019 on evaluation today patient appears to be doing excellent in regard to his legs bilaterally. In fact there is only one small area on the posterior aspect of his left leg that appears to be an issue at this time. Fortunately there is no signs of active infection which is great news. No fevers, chills, nausea, vomiting, or diarrhea. I do believe that for all intents purposes this is completely healed I think it would be appropriate for [place] to make a referral to the lymphedema clinic for him at this point. 11/02/2019 on evaluation today patient appears to be doing well with regard to his legs bilaterally. I do not see anything that remains open at this point he still has swelling but he does have new compression socks that he got as well. Fortunately there is no signs of active infection at this time which is great news. We did make a referral to lymphedema clinic he has not heard from them yet but I did recommend him giving them a call and we can give him the number for that today. 11/16/2019 upon evaluation today patient appears to be doing well with regard to his right leg. Unfortunately his left leg has started to weep and drain again he had a couple blister areas that are of concern at this point as well. Subsequently I do believe that the patient would benefit from likely reinitiation of compression wrapping of the left leg based on what I am seeing currently. Fortunately there  is no signs of active infection at this time. 11/23/2019 upon evaluation today patient actually appears to be making some good progress in regard to his lower extremities. Fortunately there is no evidence of active infection which is great news and overall I am extremely pleased with where things stand today. The patient may still have a couple areas that are leaking on the left leg but in general he has improved dramatically. Of note he did receive his lymphedema pumps this week and is supposed to have somebody coming out to show him how to use them and get them set up for him later this week. 11/29/2019 upon evaluation today patient appears to be doing very well in regard to his leg ulcers. In fact the left leg and the right leg both appear to be for the most part closed his right leg is a little bit more swollen and again we have not been wrapping this so that may be the reason. Nonetheless I do believe that the patient may benefit from wrapping both legs 1 more week just to make sure nothing reopens even though it seems to be doing quite well I am hopeful he will be ready for discharge at that point next week. 12/07/2019 upon evaluation today patient appears to be doing excellent in regard to his lower extremities. In fact it does appear today that everything is completely closed he is  healed and doing very well. He tells me that the lymphedema pump wrap will be coming out on Friday she was supposed to come today but was not feeling well therefore she is coming Friday to get him set up with his pumps. That will obviously be very beneficial for him. Electronic Signature(s) Signed: 12/07/2019 5:39:10 PM By: Lenda KelpStone III, Elyanna Wallick PA-C Entered By: Lenda KelpStone III, Maryrose Colvin on 12/07/2019 17:39:09 [name], Briscoe G. ([name]) Sheldon, Vikas G. ([id number]) -------------------------------------------------------------------------------- Physical Exam Details Patient Name: [name], Kian G. Date of Service:  12/07/2019 4:15 PM Medical Record Number: [id number] Patient Account Number: [id number] Date of Birth/Sex: 04/01/1975 (44 y.o. M) Treating RN: Huel CoventryWoody, Kim Primary Care Provider: Kerby NoraBedsole, Amy Other Clinician: Referring Provider: Kerby NoraBedsole, Amy Treating Provider/Extender: Rowan BlaseStone, Jameica Couts Weeks in Treatment: 11 Constitutional Well-nourished and well-hydrated in no acute distress. Respiratory normal breathing without difficulty. Psychiatric this patient is able to make decisions and demonstrates good insight into disease process. Alert and Oriented x 3. pleasant and cooperative. Notes Upon inspection patient's wound bed actually showed signs of good epithelialization at this time. There does not appear to be any evidence of active infection which is great news and overall I am extremely pleased with where things stand today. Electronic Signature(s) Signed: 12/07/2019 5:40:14 PM By: Lenda KelpStone III, Aedon Deason PA-C Previous Signature: 12/07/2019 5:39:23 PM Version By: Lenda KelpStone III, Taletha Twiford PA-C Entered By: Lenda KelpStone III, Rorey Hodges on 12/07/2019 17:40:14 [name], Malosi G. ([id number]) -------------------------------------------------------------------------------- Physician Orders Details Patient Name: [name], Mousa G. Date of Service: 12/07/2019 4:15 PM Medical Record Number: [id number] Patient Account Number: [id number] Date of Birth/Sex: 08/05/1975 (44 y.o. M) Treating RN: Huel CoventryWoody, Kim Primary Care Provider: Kerby NoraBedsole, Amy Other Clinician: Referring Provider: Kerby NoraBedsole, Amy Treating Provider/Extender: Rowan BlaseStone, Florinda Taflinger Weeks in Treatment: 11 Verbal / Phone Orders: No Diagnosis Coding ICD-10 Coding Code Description I89.0 Lymphedema, not elsewhere classified L97.821 Non-pressure chronic ulcer of other part of left lower leg limited to breakdown of skin L97.811 Non-pressure chronic ulcer of other part of right lower leg limited to breakdown of skin I10 Essential (primary) hypertension Edema Control o Patient to wear own  compression stockings - Put them on first thing in the morning and take them off before bed in the evening o Elevate legs to the level of the heart and pump ankles as often as possible o Compression Pump: Use compression pump on left lower extremity for 60 minutes, twice daily. o Compression Pump: Use compression pump on right lower extremity for 60 minutes, twice daily. [facility] Electronic Signature(s) Signed: 12/07/2019 5:58:45 PM By: Lenda KelpStone III, Breylin Dom PA-C Signed: 12/09/2019 5:12:38 PM By: Elliot GurneyWoody, BSN, RN, CWS, Kim RN, BSN Entered By: Elliot GurneyWoody, BSN, RN, CWS, Kim on 12/07/2019 16:53:51 [name], Viktor G. ([id number]) -------------------------------------------------------------------------------- Problem List Details Patient Name: [name], Tyquavious G. Date of Service: 12/07/2019 4:15 PM Medical Record Number: [id number] Patient Account Number: [id number] Date of Birth/Sex: 01/18/1976 (44 y.o. M) Treating RN: Huel CoventryWoody, Kim Primary Care Provider: Kerby NoraBedsole, Amy Other Clinician: Referring Provider: Kerby NoraBedsole, Amy Treating Provider/Extender: Rowan BlaseStone, Cadance Raus Weeks in Treatment: 11 Active Problems ICD-10 Encounter Code Description Active Date MDM Diagnosis I89.0 Lymphedema, not elsewhere classified 09/20/2019 No Yes L97.821 Non-pressure chronic ulcer of other part of left lower leg limited to 09/20/2019 No Yes breakdown of skin L97.811 Non-pressure chronic ulcer of other part of right lower leg limited to 09/20/2019 No Yes breakdown of skin I10 Essential (primary) hypertension 09/20/2019 No Yes Inactive Problems Resolved Problems Electronic Signature(s) Signed: 12/07/2019 4:42:37 PM By: Lenda KelpStone III, Alphia Behanna PA-C  EntereLenda KelpIII, Keiton Cosma on 12/07/2019 16:42:37 [name] ([id number]) -------------------------------------------------------------------------------- Progress Note Details Patient Name: MAGNUM, LUNDE Date of Service: 12/07/2019  4:15 PM Medical Record Number: [id number] Patient Account Number: [id number] Date of Birth/Sex: November 29, 1975 (44 y.o. M) Treating RN: Huel Coventry Primary Care Provider: Kerby Nora Other Clinician: Referring Provider: Kerby Nora Treating Provider/Extender: Rowan Blase in Treatment: 11 Subjective Chief Complaint Information obtained from Patient Bilateral LE Lymphedema History of Present Illness (HPI) 09/20/2019 upon evaluation today patient presents for initial evaluation in our clinic concerning issues that he has been having with wounds over the bilateral lower extremities. Both legs seem to be fairly equally affected at this point based on what I am seeing. With that being said the patient also appears to be having some drainage which is somewhat blue/green in nature and does have me concerned about the possibility of infection. I explained this often can indicate a Pseudomonas infection despite the fact that the patient has been on antibiotics currently this could still be problematic. He currently does not have home health that is something that we did discussed the potential for looking into. I think that could be beneficial for him to be honest. The patient seems somewhat frustrated with the situation in general. He does have a history of lymphedema, hypertension, and he does ambulate and walk though not a lot and tells me that he really does not drive unless is absolutely forced for something he absolutely has to do otherwise he is essentially homebound at this point. He did have a believe his mother with him today. 09/28/2019 on evaluation today patient appears to be doing better in regard to his leg ulcers at this point. The antibiotic does seem to be helping and overall he tells me that his pain is less though he still is having discomfort. Fortunately there is no signs of active infection at this time. 10/04/2019 upon evaluation today patient appears to be doing well at this point  in regard to his wounds. He in fact is improving dramatically from the standpoint of his infection which I think is under great control. I see no evidence of infection systemically at this point and overall I am extremely pleased I think he is getting close to resolution to be honest though he still has several scattered areas at this point. 10/12/2019 upon evaluation today patient actually seems to be making excellent progress in regard to his leg ulcers at this point. He just has a small area on the left posterior and a small area on the right anterior leg that are still open. Everything else has completely resolved and seems to have done excellent up to this point. 10/19/2019 upon evaluation today patient actually appears to be doing much better on the right leg with regard to the open wounds in fact there is nothing remaining at this point which is great news. Overall I am very pleased with where things stand. I do believe that the compression has helped he still has edema which is quite significant I still believe lymphedema pumps would be beneficial for him. With regard to the left leg he still has again the edema and this is nonetheless quite significant despite compression therapy but still I do believe that he is doing much better with the compression I do believe again lymphedema pumps would be of benefit in this regard as well. He has been trying to elevate and move around/exercise much as possible but nonetheless I still think that the  compression which he does have his compression socks today as well as lymphedema pumps would greatly benefit him as far as preventing future ulcerations/blisters that would lead to further expensive wound care visits. 10/25/2019 on evaluation today patient appears to be doing excellent in regard to his legs bilaterally. In fact there is only one small area on the posterior aspect of his left leg that appears to be an issue at this time. Fortunately there is no  signs of active infection which is great news. No fevers, chills, nausea, vomiting, or diarrhea. I do believe that for all intents purposes this is completely healed I think it would be appropriate for [place] to make a referral to the lymphedema clinic for him at this point. 11/02/2019 on evaluation today patient appears to be doing well with regard to his legs bilaterally. I do not see anything that remains open at this point he still has swelling but he does have new compression socks that he got as well. Fortunately there is no signs of active infection at this time which is great news. We did make a referral to lymphedema clinic he has not heard from them yet but I did recommend him giving them a call and we can give him the number for that today. 11/16/2019 upon evaluation today patient appears to be doing well with regard to his right leg. Unfortunately his left leg has started to weep and drain again he had a couple blister areas that are of concern at this point as well. Subsequently I do believe that the patient would benefit from likely reinitiation of compression wrapping of the left leg based on what I am seeing currently. Fortunately there is no signs of active infection at this time. 11/23/2019 upon evaluation today patient actually appears to be making some good progress in regard to his lower extremities. Fortunately there is no evidence of active infection which is great news and overall I am extremely pleased with where things stand today. The patient may still have a couple areas that are leaking on the left leg but in general he has improved dramatically. Of note he did receive his lymphedema pumps this week and is supposed to have somebody coming out to show him how to use them and get them set up for him later this week. 11/29/2019 upon evaluation today patient appears to be doing very well in regard to his leg ulcers. In fact the left leg and the right leg both appear to be for the  most part closed his right leg is a little bit more swollen and again we have not been wrapping this so that may be the reason. Nonetheless I do believe that the patient may benefit from wrapping both legs 1 more week just to make sure nothing reopens even though it seems to be doing quite well I am hopeful he will be ready for discharge at that point next week. 12/07/2019 upon evaluation today patient appears to be doing excellent in regard to his lower extremities. In fact it does appear today that everything is completely closed he is healed and doing very well. He tells me that the lymphedema pump wrap will be coming out on Friday she was supposed to come today but was not feeling well therefore she is coming Friday to get him set up with his pumps. That will obviously be very beneficial for him. VENNIE, WAYMIRE ([id number]) Objective Constitutional Well-nourished and well-hydrated in no acute distress. Vitals Time Taken: 4:10 AM, Height:  77 in, Weight: 265 lbs, BMI: 31.4, Temperature: 97.9 F, Pulse: 96 bpm, Respiratory Rate: 20 breaths/min, Blood Pressure: 158/105 mmHg. Respiratory normal breathing without difficulty. Psychiatric this patient is able to make decisions and demonstrates good insight into disease process. Alert and Oriented x 3. pleasant and cooperative. General Notes: Upon inspection patient's wound bed actually showed signs of good epithelialization at this time. There does not appear to be any evidence of active infection which is great news and overall I am extremely pleased with where things stand today. Integumentary (Hair, Skin) Wound #3 status is Healed - Epithelialized. Original cause of wound was Gradually Appeared. The wound is located on the Left,Medial,Anterior Lower Leg. The wound measures 0cm length x 0cm width x 0cm depth; 0cm^2 area and 0cm^3 volume. There is no tunneling or undermining noted. There is a none present amount of drainage noted. The wound  margin is flat and intact. There is medium (34-66%) pink granulation within the wound bed. There is no necrotic tissue within the wound bed. Wound #4 status is Healed - Epithelialized. Original cause of wound was Gradually Appeared. The wound is located on the Left,Midline,Posterior Lower Leg. The wound measures 0cm length x 0cm width x 0cm depth; 0cm^2 area and 0cm^3 volume. There is no tunneling or undermining noted. There is a small amount of serosanguineous drainage noted. The wound margin is flat and intact. There is medium (34-66%) red granulation within the wound bed. There is a small (1-33%) amount of necrotic tissue within the wound bed including Eschar. 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 Compression Pump: Use compression pump on right lower extremity for 60 minutes, twice daily. [facility]charge From [facility]: Discharge from [facility] 1. I would recommend currently that we go ahead and continue with the wound care measures as before with regard to compression though he does not have any open wounds I still think this is beneficial as far as preventing issues going forward. He does have his compression stockings with him today. 2. I am also can recommend that the patient continue with elevation. I think this is important and once he is able to get his lymphedema pumps going on Friday as well I think this will also be  beneficial. We will see the patient back for follow-up visit as needed. WALDRON, GERRY ([id number]) Electronic Signature(s) Signed: 12/07/2019 5:40:50 PM By: Lenda Kelp PA-C Entered By: Lenda Kelp on 12/07/2019 17:40:50 JOAOVICTOR, KRONE ([id number]) -------------------------------------------------------------------------------- SuperBill Details Patient Name: [name] Date of Service: 12/07/2019 Medical Record Number: [id number] Patient Account Number: [id number] Date of Birth/Sex: Apr 12, 1975 (44 y.o. M) Treating RN: Huel Coventry Primary Care Provider: Kerby Nora Other Clinician: Referring Provider: Kerby Nora Treating Provider/Extender: Rowan Blase in Treatment: 11 Diagnosis Coding ICD-10 Codes Code Description I89.0 Lymphedema, not elsewhere classified L97.821 Non-pressure chronic ulcer of other part of left lower leg limited to breakdown of skin L97.811 Non-pressure chronic ulcer of other part of right lower  leg limited to breakdown of skin I10 Essential (primary) hypertension Facility Procedures CPT4 Code: [id number] Description: [id number] - WOUND CARE VISIT-LEV 2 EST PT Modifier: Quantity: 1 Physician Procedures CPT4 Code: [id number] Description: 99213 - WC PHYS LEVEL 3 - EST PT Modifier: Quantity: 1 CPT4 Code: Description: ICD-10 Diagnosis Description I89.0 Lymphedema, not elsewhere classified 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

## 2019-12-09 NOTE — Progress Notes (Signed)
Jeffrey GillisSTEWART, Jaheem G. ([id number]) Visit Report for 12/07/2019 Arrival Information Details Patient Name: Jeffrey GillisSTEWART, Timur G. Date of Service: 12/07/2019 4:15 PM Medical Record Number: [id number] Patient Account Number: [id number] Date of Birth/Sex: 02/01/1975 (44 y.o. M) Treating RN: Rogers BlockerSanchez, Kenia Primary Care Jordyan Hardiman: Kerby NoraBedsole, Amy Other Clinician: Referring Ednamae Schiano: Kerby NoraBedsole, Amy Treating Leasha Goldberger/Extender: Rowan BlaseStone, Hoyt Weeks in Treatment: 11 Visit Information History Since Last Visit Pain Present Now: Yes Patient Arrived: Ambulatory Arrival Time: 16:09 Accompanied By: self Transfer Assistance: None Patient Identification Verified: Yes Secondary Verification Process Completed: Yes Patient Requires Transmission-Based Precautions: No Patient Has Alerts: No Electronic Signature(s) Signed: 12/07/2019 5:14:04 PM By: Phillis HaggisSanchez Pereyda, Dondra PraderKenia Entered By: Phillis HaggisSanchez Pereyda, Dondra PraderKenia on 12/07/2019 16:09:58 Jeffrey GillisSTEWART, Brainard G. ([id number]) -------------------------------------------------------------------------------- Clinic Level of Care Assessment Details Patient Name: Jeffrey GillisSTEWART, Ramar G. Date of Service: 12/07/2019 4:15 PM Medical Record Number: [id number] Patient Account Number: [id number] Date of Birth/Sex: 09/29/1975 (44 y.o. M) Treating RN: Huel CoventryWoody, Kim Primary Care Olie Dibert: Kerby NoraBedsole, Amy Other Clinician: Referring Javel Hersh: Kerby NoraBedsole, Amy Treating Paytience Bures/Extender: Rowan BlaseStone, Hoyt Weeks in Treatment: 11 Clinic Level of Care Assessment Items TOOL 4 Quantity Score []  - Use when only an EandM is performed on FOLLOW-UP visit 0 ASSESSMENTS - Nursing Assessment / Reassessment X - Reassessment of Co-morbidities (includes updates in patient status) 1 10 X- 1 5 Reassessment of Adherence to Treatment Plan ASSESSMENTS - Wound and Skin Assessment / Reassessment X - Simple Wound Assessment / Reassessment - one wound 1 5 []  - 0 Complex Wound Assessment / Reassessment - multiple wounds []  - 0 Dermatologic / Skin  Assessment (not related to wound area) ASSESSMENTS - Focused Assessment []  - Circumferential Edema Measurements - multi extremities 0 []  - 0 Nutritional Assessment / Counseling / Intervention []  - 0 Lower Extremity Assessment (monofilament, tuning fork, pulses) []  - 0 Peripheral Arterial Disease Assessment (using hand held doppler) ASSESSMENTS - Ostomy and/or Continence Assessment and Care []  - Incontinence Assessment and Management 0 []  - 0 Ostomy Care Assessment and Management (repouching, etc.) PROCESS - Coordination of Care X - Simple Patient / Family Education for ongoing care 1 15 []  - 0 Complex (extensive) Patient / Family Education for ongoing care []  - 0 Staff obtains ChiropractorConsents, Records, Test Results / Process Orders []  - 0 Staff telephones HHA, Nursing Homes / Clarify orders / etc []  - 0 Routine Transfer to another Facility (non-emergent condition) []  - 0 Routine Hospital Admission (non-emergent condition) []  - 0 New Admissions / Manufacturing engineernsurance Authorizations / Ordering NPWT, Apligraf, etc. []  - 0 Emergency Hospital Admission (emergent condition) X- 1 10 Simple Discharge Coordination []  - 0 Complex (extensive) Discharge Coordination PROCESS - Special Needs []  - Pediatric / Minor Patient Management 0 []  - 0 Isolation Patient Management []  - 0 Hearing / Language / Visual special needs []  - 0 Assessment of Community assistance (transportation, D/C planning, etc.) []  - 0 Additional assistance / Altered mentation []  - 0 Support Surface(s) Assessment (bed, cushion, seat, etc.) INTERVENTIONS - Wound Cleansing / Measurement Ehle, Logon G. ([id number]) X- 1 5 Simple Wound Cleansing - one wound []  - 0 Complex Wound Cleansing - multiple wounds X- 1 5 Wound Imaging (photographs - any number of wounds) []  - 0 Wound Tracing (instead of photographs) X- 1 5 Simple Wound Measurement - one wound []  - 0 Complex Wound Measurement - multiple wounds INTERVENTIONS - Wound  Dressings []  - Small Wound Dressing one or multiple wounds 0 []  - 0 Medium Wound Dressing one or multiple wounds []  - 0 Large Wound Dressing one  or multiple wounds []  - 0 Application of Medications - topical []  - 0 Application of Medications - injection INTERVENTIONS - Miscellaneous []  - External ear exam 0 []  - 0 Specimen Collection (cultures, biopsies, blood, body fluids, etc.) []  - 0 Specimen(s) / Culture(s) sent or taken to Lab for analysis []  - 0 Patient Transfer (multiple staff / / Similar devices) []  - 0 Simple Staple / Suture removal (25 or less) []  - 0 Complex Staple / Suture removal (26 or more) []  - 0 Hypo / Hyperglycemic Management (close monitor of Blood Glucose) []  - 0 Ankle / Brachial Index (ABI) - do not check if billed separately X- 1 5 Vital Signs Has the patient been seen at the hospital within the last three years: Yes Total Score: 65 Level Of Care: New/Established - Level 2 Electronic Signature(s) Signed: 12/09/2019 5:12:38 PM By: , BSN, RN, CWS, Kim RN, BSN Entered By: , BSN, RN, CWS, Kim on 12/07/2019 16:54:28 (Nurse, adult) -------------------------------------------------------------------------------- Encounter Discharge Information Details Patient Name: Date of Service: 12/07/2019 4:15 PM Medical Record Number: Patient Account Number: Date of Birth/Sex: 17-Mar-1975 (44 y.o. M) Treating RN: Elliot Gurney Primary Care Maelie Chriswell: 12/09/2019 Other Clinician: Referring Darcell Sabino: Jeffrey Lin Treating Mitesh Rosendahl/Extender: [id number] in Treatment: 11 Encounter Discharge Information Items Discharge Condition: Stable Ambulatory Status: Ambulatory Discharge Destination: Home Transportation: Private Auto Accompanied By: self Schedule Follow-up Appointment: No Clinical Summary of Care: Electronic Signature(s) Signed: 12/07/2019 5:14:04 PM By: [id number],  [id number] Entered By: 07/26/1975, [birth year]1999 on 12/07/2019 17:02:20 Kerby Nora (Kerby Nora) -------------------------------------------------------------------------------- Lower Extremity Assessment Details Patient Name: Rowan Blase Date of Service: 12/07/2019 4:15 PM Medical Record Number: 12/09/2019 Patient Account Number: Phillis Haggis Date of Birth/Sex: 1975-09-16 (44 y.o. M) Treating RN: Dondra Prader Primary Care Bekim Werntz: 12/09/2019 Other Clinician: Referring Jeramyah Goodpasture: Jeffrey Lin Treating Christabel Camire/Extender: [id number] Weeks in Treatment: 11 Edema Assessment Assessed: [Left: Yes] [Right: Yes] Edema: [Left: Yes] [Right: Yes] Calf Left: Right: Point of Measurement: 35 cm From Medial Instep 37.3 cm 38.3 cm Ankle Left: Right: Point of Measurement: 10 cm From Medial Instep 35.6 cm 28.6 cm Vascular Assessment Pulses: Dorsalis Pedis Palpable: [Left:No Yes] [Right:No Yes] Electronic Signature(s) Signed: 12/07/2019 5:14:04 PM By: 12/09/2019, [id number] Entered By: [id number], 07/26/1975 on 12/07/2019 16:34:08 Rogers Blocker (Kerby Nora) -------------------------------------------------------------------------------- Multi Wound Chart Details Patient Name: Kerby Nora Date of Service: 12/07/2019 4:15 PM Medical Record Number: 12/09/2019 Patient Account Number: Phillis Haggis Date of Birth/Sex: Dec 01, 1975 (44 y.o. M) Treating RN: Dondra Prader Primary Care Raushanah Osmundson: 12/09/2019 Other Clinician: Referring Anastasiya Gowin: Jeffrey Lin Treating Rae Plotner/Extender: [id number] in Treatment: 11 Vital Signs Height(in): 77 Pulse(bpm): 96 Weight(lbs): 265 Blood Pressure(mmHg): 158/105 Body Mass Index(BMI): 31 Temperature(F): 97.9 Respiratory Rate(breaths/min): 20 Photos: [N/A:N/A] Wound Location: Left, Medial, Anterior Lower Leg Left, Midline, Posterior Lower Leg N/A Wounding Event: Gradually Appeared Gradually Appeared N/A Primary Etiology: Venous Leg Ulcer Venous  Leg Ulcer N/A Comorbid History: Hypertension Hypertension N/A Date Acquired: 11/09/2019 11/09/2019 N/A Weeks of Treatment: 3 3 N/A Wound Status: Healed - Epithelialized Healed - Epithelialized N/A Measurements L x W x D (cm) 0x0x0 0x0x0 N/A Area (cm) : 0 0 N/A Volume (cm) : 0 0 N/A % Reduction in Area: 100.00% 100.00% N/A % Reduction in Volume: 100.00% 100.00% N/A Classification: Partial Thickness Partial Thickness N/A Exudate Amount: None Present Small N/A Exudate Type: N/A Serosanguineous N/A Exudate Color: N/A red, brown N/A Wound Margin: Flat and Intact Flat and  Intact N/A Granulation Amount: Medium (34-66%) Medium (34-66%) N/A Granulation Quality: Pink Red N/A Necrotic Amount: None Present (0%) Small (1-33%) N/A Necrotic Tissue: N/A Eschar N/A Exposed Structures: Fascia: No Fascia: No N/A Fat Layer (Subcutaneous Tissue): Fat Layer (Subcutaneous Tissue): No No Tendon: No Tendon: No Muscle: No Muscle: No Joint: No Joint: No Bone: No Bone: No Epithelialization: Medium (34-66%) Medium (34-66%) N/A Treatment Notes Electronic Signature(s) Signed: 12/09/2019 5:12:38 PM By: Elliot Gurney, BSN, RN, CWS, Kim RN, BSN Entered By: Elliot Gurney, BSN, RN, CWS, Kim on 12/07/2019 16:53:32 AVREY, HYSER ([id number]) -------------------------------------------------------------------------------- Multi-Disciplinary Care Plan Details Patient Name: Jeffrey Lin Date of Service: 12/07/2019 4:15 PM Medical Record Number: [id number] Patient Account Number: [id number] Date of Birth/Sex: September 09, 1975 (44 y.o. M) Treating RN: Huel Coventry Primary Care Rosalea Withrow: Kerby Nora Other Clinician: Referring Shaquaya Wuellner: Kerby Nora Treating Laruth Hanger/Extender: Rowan Blase in Treatment: 11 Active Inactive Electronic Signature(s) Signed: 12/07/2019 6:32:40 PM By: Elliot Gurney, BSN, RN, CWS, Kim RN, BSN Entered By: Elliot Gurney, BSN, RN, CWS, Kim on 12/07/2019 18:32:40 MONTRAVIOUS, WEIGELT  ([id number]) -------------------------------------------------------------------------------- Pain Assessment Details Patient Name: Jeffrey Lin Date of Service: 12/07/2019 4:15 PM Medical Record Number: [id number] Patient Account Number: [id number] Date of Birth/Sex: Jul 17, 1975 (44 y.o. M) Treating RN: Rogers Blocker Primary Care Twinkle Sockwell: Kerby Nora Other Clinician: Referring Reford Olliff: Kerby Nora Treating Quinterious Walraven/Extender: Rowan Blase in Treatment: 11 Active Problems Location of Pain Severity and Description of Pain Patient Has Paino Yes Site Locations Pain Location: Generalized Pain Rate the pain. Current Pain Level: 2 Pain Management and Medication Current Pain Management: Electronic Signature(s) Signed: 12/07/2019 5:14:04 PM By: Phillis Haggis, Dondra Prader Entered By: Phillis Haggis, Dondra Prader on 12/07/2019 16:12:53 Jeffrey Lin ([id number]) -------------------------------------------------------------------------------- Patient/Caregiver Education Details Patient Name: Jeffrey Lin Date of Service: 12/07/2019 4:15 PM Medical Record Number: [id number] Patient Account Number: [id number] Date of Birth/Gender: [birth year] (44 y.o. M) Treating RN: Huel Coventry Primary Care Physician: Kerby Nora Other Clinician: Referring Physician: Kerby Nora Treating Physician/Extender: Rowan Blase in Treatment: 11 Education Assessment Education Provided To: Patient Education Topics Provided Venous: Handouts: Controlling Swelling with Compression Stockings , Other: elevate, use pumps, stockings applied daily Methods: Demonstration, Explain/Verbal Responses: State content correctly Wound/Skin Impairment: Handouts: Caring for Your Ulcer Methods: Demonstration, Explain/Verbal Electronic Signature(s) Signed: 12/09/2019 5:12:38 PM By: Elliot Gurney, BSN, RN, CWS, Kim RN, BSN Entered By: Elliot Gurney, BSN, RN, CWS, Kim on 12/07/2019 16:55:15 Jeffrey Lin  ([id number]) -------------------------------------------------------------------------------- Wound Assessment Details Patient Name: Jeffrey Lin Date of Service: 12/07/2019 4:15 PM Medical Record Number: [id number] Patient Account Number: [id number] Date of Birth/Sex: 10/04/1975 (44 y.o. M) Treating RN: Huel Coventry Primary Care Nakoa Ganus: Kerby Nora Other Clinician: Referring Twanda Stakes: Kerby Nora Treating Mickel Schreur/Extender: Rowan Blase in Treatment: 11 Wound Status Wound Number: 3 Primary Etiology: Venous Leg Ulcer Wound Location: Left, Medial, Anterior Lower Leg Wound Status: Healed - Epithelialized Wounding Event: Gradually Appeared Comorbid History: Hypertension Date Acquired: 11/09/2019 Weeks Of Treatment: 3 Clustered Wound: No Photos Wound Measurements Length: (cm) 0 Width: (cm) 0 Depth: (cm) 0 Area: (cm) 0 Volume: (cm) 0 % Reduction in Area: 100% % Reduction in Volume: 100% Epithelialization: Medium (34-66%) Tunneling: No Undermining: No Wound Description Classification: Partial Thickness Wound Margin: Flat and Intact Exudate Amount: None Present Foul Odor After Cleansing: No Slough/Fibrino No Wound Bed Granulation Amount: Medium (34-66%) Exposed Structure Granulation Quality: Pink Fascia Exposed: No Necrotic Amount: None Present (0%) Fat Layer (Subcutaneous Tissue) Exposed: No Tendon Exposed: No Muscle Exposed: No Joint Exposed: No Bone Exposed: No Electronic Signature(s)  Signed: 12/09/2019 5:12:38 PM By: Elliot Gurney, BSN, RN, CWS, Kim RN, BSN Entered By: Elliot Gurney, BSN, RN, CWS, Kim on 12/07/2019 16:50:08 [name], [place] ([id number]) -------------------------------------------------------------------------------- Wound Assessment Details Patient Name: Jeffrey Lin Date of Service: 12/07/2019 4:15 PM Medical Record Number: [id number] Patient Account Number: [id number] Date of Birth/Sex: August 20, 1975 (44 y.o. M) Treating RN: Huel Coventry Primary  Care Milfred Krammes: Kerby Nora Other Clinician: Referring Lenward Able: Kerby Nora Treating Wally Behan/Extender: Rowan Blase in Treatment: 11 Wound Status Wound Number: 4 Primary Etiology: Venous Leg Ulcer Wound Location: Left, Midline, Posterior Lower Leg Wound Status: Healed - Epithelialized Wounding Event: Gradually Appeared Comorbid History: Hypertension Date Acquired: 11/09/2019 Weeks Of Treatment: 3 Clustered Wound: No Photos Wound Measurements Length: (cm) 0 Width: (cm) 0 Depth: (cm) 0 Area: (cm) 0 Volume: (cm) 0 % Reduction in Area: 100% % Reduction in Volume: 100% Epithelialization: Medium (34-66%) Tunneling: No Undermining: No Wound Description Classification: Partial Thickness Wound Margin: Flat and Intact Exudate Amount: Small Exudate Type: Serosanguineous Exudate Color: red, brown Foul Odor After Cleansing: No Slough/Fibrino No Wound Bed Granulation Amount: Medium (34-66%) Exposed Structure Granulation Quality: Red Fascia Exposed: No Necrotic Amount: Small (1-33%) Fat Layer (Subcutaneous Tissue) Exposed: No Necrotic Quality: Eschar Tendon Exposed: No Muscle Exposed: No Joint Exposed: No Bone Exposed: No Electronic Signature(s) Signed: 12/09/2019 5:12:38 PM By: Elliot Gurney, BSN, RN, CWS, Kim RN, BSN Entered By: Elliot Gurney, BSN, RN, CWS, Kim on 12/07/2019 16:50:08 Jeffrey Lin ([id number]) -------------------------------------------------------------------------------- Vitals Details Patient Name: Jeffrey Lin Date of Service: 12/07/2019 4:15 PM Medical Record Number: [id number] Patient Account Number: [id number] Date of Birth/Sex: 11-Aug-1975 (44 y.o. M) Treating RN: Rogers Blocker Primary Care Pardeep Pautz: Kerby Nora Other Clinician: Referring Gaelan Glennon: Kerby Nora Treating Jacques Willingham/Extender: Rowan Blase in Treatment: 11 Vital Signs Time Taken: 04:10 Temperature (F): 97.9 Height (in): 77 Pulse (bpm): 96 Weight (lbs): 265 Respiratory  Rate (breaths/min): 20 Body Mass Index (BMI): 31.4 Blood Pressure (mmHg): 158/105 Reference Range: 80 - 120 mg / dl Electronic Signature(s) Signed: 12/07/2019 5:14:04 PM By: Phillis Haggis, Dondra Prader Entered By: Phillis Haggis, Dondra Prader on 12/07/2019 16:12:30

## 2020-01-01 ENCOUNTER — Other Ambulatory Visit: Payer: Self-pay | Admitting: Family Medicine

## 2020-01-03 ENCOUNTER — Ambulatory Visit: Payer: PPO | Admitting: Family

## 2020-03-24 ENCOUNTER — Other Ambulatory Visit: Payer: Self-pay

## 2020-03-24 ENCOUNTER — Encounter: Payer: Self-pay | Admitting: Family Medicine

## 2020-03-24 ENCOUNTER — Ambulatory Visit (INDEPENDENT_AMBULATORY_CARE_PROVIDER_SITE_OTHER): Payer: Medicare Other | Admitting: Family Medicine

## 2020-03-24 VITALS — BP 152/100 | HR 105 | Temp 96.7°F | Ht 77.0 in | Wt 280.5 lb

## 2020-03-24 DIAGNOSIS — L03116 Cellulitis of left lower limb: Secondary | ICD-10-CM

## 2020-03-24 DIAGNOSIS — I890 Lymphedema, not elsewhere classified: Secondary | ICD-10-CM

## 2020-03-24 MED ORDER — CEPHALEXIN 500 MG PO CAPS: 500.0000 mg | ORAL_CAPSULE | Freq: Three times a day (TID) | ORAL | 0 refills | Status: DC

## 2020-03-24 NOTE — Progress Notes (Signed)
Patient ID: Jeffrey Lin, male    DOB: [birth year], 45 y.o.   MRN: [id number]  This visit was conducted in person.  BP (!) 152/100   Pulse (!) 105   Temp (!) 96.7 F (35.9 C) (Temporal)   Ht 6\' 5"  (1.956 m)   Wt 280 lb 8 oz (127.2 kg)   SpO2 97%   BMI 33.26 kg/m    CC:  Chief Complaint  Patient presents with  . Leg Swelling  . Foot Swelling    Subjective:   HPI: Jeffrey Lin is a 45 y.o. male presenting on 03/24/2020 for Leg Swelling and Foot Swelling  History of recurrent cellulitis, leg ulcers and peripheral edema/lymphedema.  In past seen by wound care center. He has lymphedema pumps and was referred in 11/2019 to lymphedema clinic ( he never saw them.    Today he reports  He has been using lymphedema pumps twice a day and wearing compression hose. In last 4 days he noted sudden onset increase in swelling in both legs, left more than right. Legs are warmer than usual. No fever, no chills.  no current ulcers, no current drainage or weeping.   No new CP or SOB.    Relevant past medical, surgical, family and social history reviewed and updated as indicated. Interim medical history since our last visit reviewed. Allergies and medications reviewed and updated. Outpatient Medications Prior to Visit  Medication Sig Dispense Refill  . amphetamine-dextroamphetamine (ADDERALL) 20 MG tablet Take 20 mg by mouth in the morning, at noon, and at bedtime.    12/2019 atorvastatin (LIPITOR) 40 MG tablet TAKE 1 TABLET BY MOUTH EVERY DAY 90 tablet 3  . carvedilol (COREG) 25 MG tablet Take 37.5 mg by mouth 2 (two) times daily with a meal.    . diazepam (VALIUM) 10 MG tablet Take 10 mg by mouth 4 (four) times daily.    Marland Kitchen gabapentin (NEURONTIN) 600 MG tablet Take 1,200 mg by mouth 3 (three) times daily.     Marland Kitchen losartan (COZAAR) 100 MG tablet TAKE 1/2 TABLET BY MOUTH DAILY 45 tablet 0  . potassium chloride (KLOR-CON) 10 MEQ tablet TAKE 1 TABLET BY MOUTH EVERY DAY 90 tablet 1  . tamsulosin  (FLOMAX) 0.4 MG CAPS capsule Take 1 capsule (0.4 mg total) by mouth daily. 90 capsule 3  . tizanidine (ZANAFLEX) 2 MG capsule TAKE 1 CAPSULE BY MOUTH 3 TIMES DAILY AS NEEDED FOR MUSCLE SPASMS. 90 capsule 2  . torsemide (DEMADEX) 20 MG tablet Take 2 tablets (40 mg total) by mouth daily. 60 tablet 2  . Vitamin D, Ergocalciferol, (DRISDOL) 50000 units CAPS capsule Take 1 capsule (50,000 Units total) by mouth 2 (two) times a week.    . carvedilol (COREG) 25 MG tablet Take 1.5 tablets (37.5 mg total) by mouth 2 (two) times daily. 270 tablet 1  . amantadine (SYMMETREL) 100 MG capsule Take 100 mg by mouth 2 (two) times daily.    Marland Kitchen amoxicillin-clavulanate (AUGMENTIN) 875-125 MG tablet Take 1 tablet by mouth 2 (two) times daily.    Marland Kitchen amphetamine-dextroamphetamine (ADDERALL) 20 MG tablet Take 20 mg by mouth in the morning, at noon, and at bedtime.    . ciprofloxacin (CIPRO) 500 MG tablet Take 500 mg by mouth 2 (two) times daily.    . mupirocin ointment (BACTROBAN) 2 % Apply 1 application topically 2 (two) times daily. After soaks, to left great toenail site 30 g 0  . omeprazole (PRILOSEC) 20 MG capsule Take  20 mg by mouth daily as needed.    . polyethylene glycol powder (MIRALAX) 17 GM/SCOOP powder Take 17 g by mouth 2 (two) times daily as needed for moderate constipation. 255 g 0  . risperiDONE (RISPERDAL) 2 MG tablet Take 2 mg by mouth at bedtime.    . senna-docusate (SENOKOT-S) 8.6-50 MG tablet Take 1 tablet by mouth 2 (two) times daily as needed for moderate constipation. 60 tablet 1   No facility-administered medications prior to visit.     Per HPI unless specifically indicated in ROS section below Review of Systems  Constitutional: Negative for fatigue and fever.  HENT: Negative for ear pain.   Eyes: Negative for pain.  Respiratory: Negative for cough and shortness of breath.   Cardiovascular: Negative for chest pain, palpitations and leg swelling.  Gastrointestinal: Negative for abdominal pain.   Genitourinary: Negative for dysuria.  Musculoskeletal: Negative for arthralgias.  Neurological: Negative for syncope, light-headedness and headaches.  Psychiatric/Behavioral: Negative for dysphoric mood.   Objective:  BP (!) 152/100   Pulse (!) 105   Temp (!) 96.7 F (35.9 C) (Temporal)   Ht 6\' 5"  (1.956 m)   Wt 280 lb 8 oz (127.2 kg)   SpO2 97%   BMI 33.26 kg/m   Wt Readings from Last 3 Encounters:  03/24/20 280 lb 8 oz (127.2 kg)  09/30/19 276 lb (125.2 kg)  09/09/19 263 lb (119.3 kg)      Physical Exam Constitutional:      General: Vital signs are normal.     Appearance: He is well-developed and well-nourished.  HENT:     Head: Normocephalic.     Right Ear: Hearing normal.     Left Ear: Hearing normal.     Nose: Nose normal.     Mouth/Throat:     Mouth: Oropharynx is clear and moist and mucous membranes are normal.  Neck:     Thyroid: No thyroid mass or thyromegaly.     Vascular: No carotid bruit.     Trachea: Trachea normal.  Cardiovascular:     Rate and Rhythm: Normal rate and regular rhythm.     Pulses: Normal pulses.     Heart sounds: Heart sounds not distant. No murmur heard. No friction rub. No gallop.      Comments: No peripheral edema Pulmonary:     Effort: Pulmonary effort is normal. No respiratory distress.     Breath sounds: Normal breath sounds.  Skin:    General: Skin is warm, dry and intact.     Findings: No rash.  Psychiatric:        Mood and Affect: Mood and affect normal.        Speech: Speech normal.        Behavior: Behavior normal.        Thought Content: Thought content normal.           Results for orders placed or performed during the hospital encounter of 09/20/19  Aerobic Culture (superficial specimen)   Specimen: Wound  Result Value Ref Range   Specimen Description      WOUND Performed at [facility], [address], [place], [place]    Special Requests      RIGHT LOWER EXTREMETY Performed at  [facility], [address] [place], [place]    Gram Stain      RARE WBC PRESENT,BOTH PMN AND MONONUCLEAR MODERATE GRAM POSITIVE COCCI FEW GRAM NEGATIVE RODS Performed at [facility],  [address] [place], [place] [address]    Culture      MODERATE ENTEROCOCCUS FAECALIS FEW PSEUDOMONAS STUTZERI    Report Status 09/23/2019 FINAL    Organism ID, Bacteria ENTEROCOCCUS FAECALIS    Organism ID, Bacteria PSEUDOMONAS STUTZERI       Susceptibility   Enterococcus faecalis - MIC*    AMPICILLIN <=2 SENSITIVE Sensitive     VANCOMYCIN 1 SENSITIVE Sensitive     GENTAMICIN SYNERGY SENSITIVE Sensitive     * MODERATE ENTEROCOCCUS FAECALIS   Pseudomonas stutzeri - MIC*    CEFTAZIDIME 4 SENSITIVE Sensitive     CIPROFLOXACIN <=0.25 SENSITIVE Sensitive     GENTAMICIN <=1 SENSITIVE Sensitive     IMIPENEM 2 SENSITIVE Sensitive     PIP/TAZO 8 SENSITIVE Sensitive     * FEW PSEUDOMONAS STUTZERI    This visit occurred during the SARS-CoV-2 public health emergency.  Safety protocols were in place, including screening questions prior to the visit, additional usage of staff PPE, and extensive cleaning of exam room while observing appropriate contact time as indicated for disinfecting solutions.   COVID 19 screen:  No recent travel or known exposure to COVID19 The patient denies respiratory symptoms of COVID 19 at this time. The importance of social distancing was discussed today.   Assessment and Plan   Lymphedema bilateral lower legs.. with acute worsening and increase in redness.. unclear if new infection or worsening edema causing changes.  Will treat with antibiotics given history but pt instructed to call lymphedema clinic for further evaluation.  Meds ordered this encounter  Medications  . cephALEXin (KEFLEX) 500 MG capsule    Sig: Take 1 capsule (500 mg total) by mouth 3 (three) times daily.    Dispense:  21 capsule    Refill:  0    Kerby Nora, MD

## 2020-03-24 NOTE — Patient Instructions (Signed)
Compete antibiotics to cover for possible infeciton.  Call to set up appt at lymphedema clinic for follow up.

## 2020-04-02 ENCOUNTER — Other Ambulatory Visit: Payer: Self-pay | Admitting: Family Medicine

## 2020-04-03 NOTE — Telephone Encounter (Signed)
Last office visit 03/24/2020 for leg/foot swelling.  Last refilled 01/03/2020 for #90 with 2 refills.  No future appointments with PCP.

## 2020-04-11 ENCOUNTER — Encounter: Payer: Medicare Other | Attending: Physician Assistant | Admitting: Physician Assistant

## 2020-04-11 ENCOUNTER — Other Ambulatory Visit: Payer: Self-pay

## 2020-04-11 DIAGNOSIS — L97812 Non-pressure chronic ulcer of other part of right lower leg with fat layer exposed: Secondary | ICD-10-CM | POA: Insufficient documentation

## 2020-04-11 DIAGNOSIS — I10 Essential (primary) hypertension: Secondary | ICD-10-CM | POA: Diagnosis not present

## 2020-04-11 DIAGNOSIS — I890 Lymphedema, not elsewhere classified: Secondary | ICD-10-CM | POA: Diagnosis not present

## 2020-04-11 NOTE — Progress Notes (Signed)
Jeffrey, Lin ([id number]) Visit Report for 04/11/2020 Abuse/Suicide Risk Screen Details Patient Name: Jeffrey Lin, Jeffrey Lin Date of Service: 04/11/2020 8:30 AM Medical Record Number: [id number] Patient Account Number: [id number] Date of Birth/Sex: [birth year] (45 y.o. M) Treating RN: Rogers Blocker Primary Care Merric Yost: Kerby Nora Other Clinician: Lolita Cram Referring Cordarrell Sane: Kerby Nora Treating Janee Ureste/Extender: Rowan Blase in Treatment: 0 Abuse/Suicide Risk Screen Items Answer ABUSE RISK SCREEN: Has anyone close to you tried to hurt or harm you recentlyo No Do you feel uncomfortable with anyone in your familyo No Has anyone forced you do things that you didnot want to doo No Electronic Signature(s) Signed: 04/11/2020 3:37:26 PM By: Lolita Cram Signed: 04/11/2020 5:09:07 PM By: Phillis Haggis, Dondra Prader RN Entered By: Lolita Cram on 04/11/2020 08:34:47 Nole, Deeann Saint ([id number]) -------------------------------------------------------------------------------- Activities of Daily Living Details Patient Name: Jeffrey Lin Date of Service: 04/11/2020 8:30 AM Medical Record Number: [id number] Patient Account Number: [id number] Date of Birth/Sex: 1975-04-08 (44 y.o. M) Treating RN: Rogers Blocker Primary Care Calob Baskette: Kerby Nora Other Clinician: Lolita Cram Referring Kameron Glazebrook: Kerby Nora Treating Sieara Bremer/Extender: Rowan Blase in Treatment: 0 Activities of Daily Living Items Answer Activities of Daily Living (Please select one for each item) Drive Automobile Completely Able Take Medications Completely Able Use Telephone Completely Able Care for Appearance Completely Able Use Toilet Completely Able Bath / Shower Completely Able Dress Self Completely Able Feed Self Completely Able Walk Completely Able Get In / Out Bed Completely Able Housework Completely Able Prepare Meals Completely Able Handle Money Completely Able Shop for Self  Completely Able Electronic Signature(s) Signed: 04/11/2020 3:37:26 PM By: Lolita Cram Signed: 04/11/2020 5:09:07 PM By: Phillis Haggis, Dondra Prader RN Entered By: Lolita Cram on 04/11/2020 08:35:13 Jeffrey Lin ([id number]) -------------------------------------------------------------------------------- Education Screening Details Patient Name: Jeffrey Lin Date of Service: 04/11/2020 8:30 AM Medical Record Number: [id number] Patient Account Number: [id number] Date of Birth/Sex: 28-Jun-1975 (45 y.o. M) Treating RN: Rogers Blocker Primary Care Kemonte Ullman: Kerby Nora Other Clinician: Lolita Cram Referring Terisha Losasso: Kerby Nora Treating Jeanie Mccard/Extender: Rowan Blase in Treatment: 0 Primary Learner Assessed: Patient Learning Preferences/Education Level/Primary Language Learning Preference: Explanation, Demonstration Highest Education Level: College or Above Preferred Language: English Cognitive Barrier Language Barrier: No Translator Needed: No Memory Deficit: No Emotional Barrier: No Cultural/Religious Beliefs Affecting Medical Care: No Physical Barrier Impaired Vision: No Impaired Hearing: No Decreased Hand dexterity: No Knowledge/Comprehension Knowledge Level: High Comprehension Level: High Ability to understand written instructions: High Ability to understand verbal instructions: High Motivation Anxiety Level: Calm Cooperation: Cooperative Education Importance: Acknowledges Need Interest in Health Problems: Asks Questions Perception: Coherent Willingness to Engage in Self-Management High Activities: Readiness to Engage in Self-Management High Activities: Electronic Signature(s) Signed: 04/11/2020 3:37:26 PM By: Lolita Cram Signed: 04/11/2020 5:09:07 PM By: Phillis Haggis, Dondra Prader RN Entered By: Lolita Cram on 04/11/2020 08:35:48 RAMESH, MOAN  ([id number]) -------------------------------------------------------------------------------- Fall Risk Assessment Details Patient Name: Jeffrey Lin Date of Service: 04/11/2020 8:30 AM Medical Record Number: [id number] Patient Account Number: [id number] Date of Birth/Sex: 13-May-1975 (45 y.o. M) Treating RN: Rogers Blocker Primary Care Danay Mckellar: Kerby Nora Other Clinician: Lolita Cram Referring Madalee Altmann: Kerby Nora Treating Tanee Henery/Extender: Rowan Blase in Treatment: 0 Fall Risk Assessment Items Have you had 2 or more falls in the last 12 monthso 0 No Have you had any fall that resulted in injury in the last 12 monthso 0 No FALLS RISK SCREEN History of falling - immediate or within 3 months 0 No Secondary diagnosis (Do you have 2 or more  medical diagnoseso) 0 No Ambulatory aid None/bed rest/wheelchair/nurse 0 No Crutches/cane/walker 0 No Furniture 0 No Intravenous therapy Access/Saline/Heparin Lock 0 No Gait/Transferring Normal/ bed rest/ wheelchair 0 No Weak (short steps with or without shuffle, stooped but able to lift head while walking, may 0 No seek support from furniture) Impaired (short steps with shuffle, may have difficulty arising from chair, head down, impaired 0 No balance) Mental Status Oriented to own ability 0 No Electronic Signature(s) Signed: 04/11/2020 3:37:26 PM By: Lolita Cram Signed: 04/11/2020 5:09:07 PM By: Phillis Haggis, Dondra Prader RN Entered By: Lolita Cram on 04/11/2020 08:36:00 Jeffrey Lin ([id number]) -------------------------------------------------------------------------------- Foot Assessment Details Patient Name: Jeffrey Lin Date of Service: 04/11/2020 8:30 AM Medical Record Number: [id number] Patient Account Number: [id number] Date of Birth/Sex: 01/18/1976 (45 y.o. M) Treating RN: Rogers Blocker Primary Care Keymarion Bearman: Kerby Nora Other Clinician: Lolita Cram Referring Brennah Quraishi: Kerby Nora Treating  Patton Swisher/Extender: Rowan Blase in Treatment: 0 Foot Assessment Items Site Locations + = Sensation present, - = Sensation absent, C = Callus, U = Ulcer R = Redness, W = Warmth, M = Maceration, PU = Pre-ulcerative lesion F = Fissure, S = Swelling, D = Dryness Assessment Right: Left: Other Deformity: No No Prior Foot Ulcer: No No Prior Amputation: No No Charcot Joint: No No Ambulatory Status: Gait: Electronic Signature(s) Signed: 04/11/2020 3:37:26 PM By: Lolita Cram Signed: 04/11/2020 5:09:07 PM By: Phillis Haggis, Dondra Prader RN Entered By: Lolita Cram on 04/11/2020 08:42:19 Jeffrey Lin ([id number]) -------------------------------------------------------------------------------- Nutrition Risk Screening Details Patient Name: Jeffrey Lin Date of Service: 04/11/2020 8:30 AM Medical Record Number: [id number] Patient Account Number: [id number] Date of Birth/Sex: 1975-02-23 (45 y.o. M) Treating RN: Rogers Blocker Primary Care Summers Buendia: Kerby Nora Other Clinician: Lolita Cram Referring Destyn Parfitt: Kerby Nora Treating Ly Wass/Extender: Rowan Blase in Treatment: 0 Height (in): 78 Weight (lbs): 280 Body Mass Index (BMI): 32.4 Nutrition Risk Screening Items Score Screening NUTRITION RISK SCREEN: I have an illness or condition that made me change the kind and/or amount of food I eat 0 No I eat fewer than two meals per day 0 No I eat few fruits and vegetables, or milk products 0 No I have three or more drinks of beer, liquor or wine almost every day 0 No I have tooth or mouth problems that make it hard for me to eat 0 No I don't always have enough money to buy the food I need 0 No I eat alone most of the time 0 No I take three or more different prescribed or over-the-counter drugs a day 0 No Without wanting to, I have lost or gained 10 pounds in the last six months 0 No I am not always physically able to shop, cook and/or feed myself 0 No Nutrition  Protocols Good Risk Protocol 0 No interventions needed Moderate Risk Protocol High Risk Proctocol Risk Level: Good Risk Score: 0 Electronic Signature(s) Signed: 04/11/2020 3:37:26 PM By: Lolita Cram Signed: 04/11/2020 5:09:07 PM By: Phillis Haggis, Dondra Prader RN Entered By: Lolita Cram on 04/11/2020 08:36:27

## 2020-04-14 ENCOUNTER — Other Ambulatory Visit: Payer: Self-pay

## 2020-04-14 DIAGNOSIS — I890 Lymphedema, not elsewhere classified: Secondary | ICD-10-CM | POA: Diagnosis not present

## 2020-04-17 NOTE — Progress Notes (Signed)
Jeffrey Lin ([id number]) Visit Report for 04/11/2020 Chief Complaint Document Details Patient Name: Jeffrey Lin, Jeffrey Lin Date of Service: 04/11/2020 8:30 AM Medical Record Number: [id number] Patient Account Number: [id number] Date of Birth/Sex: 1975-07-16 (45 y.o. M) Treating RN: Rogers Blocker Primary Care Provider: Kerby Nora Other Clinician: Lolita Cram Referring Provider: Kerby Nora Treating Provider/Extender: Rowan Blase in Treatment: 0 Information Obtained from: Patient Chief Complaint Bilateral LE Lymphedema Electronic Signature(s) Signed: 04/11/2020 9:11:45 AM By: Lenda Kelp PA-C Entered By: Lenda Kelp on 04/11/2020 09:11:45 Jeffrey Lin ([id number]) -------------------------------------------------------------------------------- HPI Details Patient Name: Jeffrey Lin Date of Service: 04/11/2020 8:30 AM Medical Record Number: [id number] Patient Account Number: [id number] Date of Birth/Sex: [birth year] (45 y.o. M) Treating RN: Rogers Blocker Primary Care Provider: Kerby Nora Other Clinician: Lolita Cram Referring Provider: Kerby Nora Treating Provider/Extender: Rowan Blase in Treatment: 0 History of Present Illness HPI Description: 09/20/2019 upon evaluation today patient presents for initial evaluation in our clinic concerning issues that he has been having with wounds over the bilateral lower extremities. Both legs seem to be fairly equally affected at this point based on what I am seeing. With that being said the patient also appears to be having some drainage which is somewhat blue/green in nature and does have me concerned about the possibility of infection. I explained this often can indicate a Pseudomonas infection despite the fact that the patient has been on antibiotics currently this could still be problematic. He currently does not have home health that is something that we did discussed the potential for looking into. I  think that could be beneficial for him to be honest. The patient seems somewhat frustrated with the situation in general. He does have a history of lymphedema, hypertension, and he does ambulate and walk though not a lot and tells me that he really does not drive unless is absolutely forced for something he absolutely has to do otherwise he is essentially homebound at this point. He did have a believe his mother with him today. 09/28/2019 on evaluation today patient appears to be doing better in regard to his leg ulcers at this point. The antibiotic does seem to be helping and overall he tells me that his pain is less though he still is having discomfort. Fortunately there is no signs of active infection at this time. 10/04/2019 upon evaluation today patient appears to be doing well at this point in regard to his wounds. He in fact is improving dramatically from the standpoint of his infection which I think is under great control. I see no evidence of infection systemically at this point and overall I am extremely pleased I think he is getting close to resolution to be honest though he still has several scattered areas at this point. 10/12/2019 upon evaluation today patient actually seems to be making excellent progress in regard to his leg ulcers at this point. He just has a small area on the left posterior and a small area on the right anterior leg that are still open. Everything else has completely resolved and seems to have done excellent up to this point. 10/19/2019 upon evaluation today patient actually appears to be doing much better on the right leg with regard to the open wounds in fact there is nothing remaining at this point which is great news. Overall I am very pleased with where things stand. I do believe that the compression has helped he still has edema which is quite significant I still believe lymphedema pumps  would be beneficial for him. With regard to the left leg he still has again the  edema and this is nonetheless quite significant despite compression therapy but still I do believe that he is doing much better with the compression I do believe again lymphedema pumps would be of benefit in this regard as well. He has been trying to elevate and move around/exercise much as possible but nonetheless I still think that the compression which he does have his compression socks today as well as lymphedema pumps would greatly benefit him as far as preventing future ulcerations/blisters that would lead to further expensive wound care visits. 10/25/2019 on evaluation today patient appears to be doing excellent in regard to his legs bilaterally. In fact there is only one small area on the posterior aspect of his left leg that appears to be an issue at this time. Fortunately there is no signs of active infection which is great news. No fevers, chills, nausea, vomiting, or diarrhea. I do believe that for all intents purposes this is completely healed I think it would be appropriate for [place] to make a referral to the lymphedema clinic for him at this point. 11/02/2019 on evaluation today patient appears to be doing well with regard to his legs bilaterally. I do not see anything that remains open at this point he still has swelling but he does have new compression socks that he got as well. Fortunately there is no signs of active infection at this time which is great news. We did make a referral to lymphedema clinic he has not heard from them yet but I did recommend him giving them a call and we can give him the number for that today. 11/16/2019 upon evaluation today patient appears to be doing well with regard to his right leg. Unfortunately his left leg has started to weep and drain again he had a couple blister areas that are of concern at this point as well. Subsequently I do believe that the patient would benefit from likely reinitiation of compression wrapping of the left leg based on what I am  seeing currently. Fortunately there is no signs of active infection at this time. 11/23/2019 upon evaluation today patient actually appears to be making some good progress in regard to his lower extremities. Fortunately there is no evidence of active infection which is great news and overall I am extremely pleased with where things stand today. The patient may still have a couple areas that are leaking on the left leg but in general he has improved dramatically. Of note he did receive his lymphedema pumps this week and is supposed to have somebody coming out to show him how to use them and get them set up for him later this week. 11/29/2019 upon evaluation today patient appears to be doing very well in regard to his leg ulcers. In fact the left leg and the right leg both appear to be for the most part closed his right leg is a little bit more swollen and again we have not been wrapping this so that may be the reason. Nonetheless I do believe that the patient may benefit from wrapping both legs 1 more week just to make sure nothing reopens even though it seems to be doing quite well I am hopeful he will be ready for discharge at that point next week. 12/07/2019 upon evaluation today patient appears to be doing excellent in regard to his lower extremities. In fact it does appear today that everything is  completely closed he is healed and doing very well. He tells me that the lymphedema pump wrap will be coming out on Friday she was supposed to come today but was not feeling well therefore she is coming Friday to get him set up with his pumps. That will obviously be very beneficial for him. Readmission: 04/11/2020 upon evaluation today patient presents for reevaluation with regard to wound on his right leg which he tells me just recently reopened. He is a patient of mine from previous that has done quite well over the past several months. He is been using his lymphedema pumps as well as his compression  stockings. He tells me that that has kept things under control although they have never really been as small as they were when Tiner, Jamoni G. ([id number]) he was here in the clinic being seen previous. Nonetheless he tells me that since November he is really had no significant issues just in the past week or so with the current wound. Electronic Signature(s) Signed: 04/11/2020 7:01:33 PM By: Lenda KelpStone III, Marissa Weaver PA-C Entered By: Lenda KelpStone III, Lennon Richins on 04/11/2020 19:01:33 Jeffrey GillisSTEWART, Jeffrey G. ([id number]) -------------------------------------------------------------------------------- Physical Exam Details Patient Name: Jeffrey GillisSTEWART, Jeffrey G. Date of Service: 04/11/2020 8:30 AM Medical Record Number: [id number] Patient Account Number: [id number] Date of Birth/Sex: 06/18/1975 (45 y.o. M) Treating RN: Rogers BlockerSanchez, Kenia Primary Care Provider: Kerby NoraBedsole, Amy Other Clinician: Lolita CramBurnette, Kyara Referring Provider: Kerby NoraBedsole, Amy Treating Provider/Extender: Rowan BlaseStone, Erik Burkett Weeks in Treatment: 0 Constitutional patient is hypertensive.. pulse regular and within target range for patient.Marland Kitchen. respirations regular, non-labored and within target range for patient.Marland Kitchen. temperature within target range for patient.. Well-nourished and well-hydrated in no acute distress. Eyes conjunctiva clear no eyelid edema noted. pupils equal round and reactive to light and accommodation. Ears, Nose, Mouth, and Throat no gross abnormality of ear auricles or external auditory canals. normal hearing noted during conversation. mucus membranes moist. Respiratory normal breathing without difficulty. Cardiovascular 2+ dorsalis pedis/posterior tibialis pulses. no clubbing, cyanosis, significant edema, <3 sec cap refill. Musculoskeletal normal gait and posture. no significant deformity or arthritic changes, no loss or range of motion, no clubbing. Psychiatric this patient is able to make decisions and demonstrates good insight into disease process. Alert and  Oriented x 3. pleasant and cooperative. Notes Patient's wound bed showed signs of good granulation epithelization at this point. There does not appear to be any signs of active infection which is great news and overall very pleased with where things stand today. He has been tolerating the dressing changes and in general I feel like he is making good progress here. No fevers, chills, nausea, vomiting, or diarrhea. He does have stage III lymphedema bilaterally but to be honest he seems to be controlling this quite well with the pumps as well as the compression wraps. No sharp debridement was necessary today. Electronic Signature(s) Signed: 04/11/2020 7:02:05 PM By: Lenda KelpStone III, [place] Festa PA-C Entered By: Lenda KelpStone III, Keshonna Valvo on 04/11/2020 19:02:04 Jeffrey GillisSTEWART, Ko G. ([id number]) -------------------------------------------------------------------------------- Physician Orders Details Patient Name: Jeffrey GillisSTEWART, Calob G. Date of Service: 04/11/2020 8:30 AM Medical Record Number: [id number] Patient Account Number: [id number] Date of Birth/Sex: 01/19/1976 (45 y.o. M) Treating RN: Rogers BlockerSanchez, Kenia Primary Care Provider: Kerby NoraBedsole, Amy Other Clinician: Lolita CramBurnette, Kyara Referring Provider: Kerby NoraBedsole, Amy Treating Provider/Extender: Rowan BlaseStone, Darlette Dubow Weeks in Treatment: 0 Verbal / Phone Orders: No Diagnosis Coding ICD-10 Coding Code Description I89.0 Lymphedema, not elsewhere classified L97.812 Non-pressure chronic ulcer of other part of right lower leg with fat layer exposed I10 Essential (primary) hypertension Follow-up Appointments o Return Appointment  in 1 week. o Nurse Visit as needed - friday Bathing/ Shower/ Hygiene o May shower with wound dressing protected with water repellent cover or cast protector. Edema Control - Lymphedema / Segmental Compressive Device / Other o Optional: One layer of unna paste to top of compression wrap (to act as an anchor). o 4 Layer Compression System (Left, Right, Bilateral)  Lymphedema. o Compression Pump: Use compression pump on left lower extremity for 60 minutes, twice daily. o Compression Pump: Use compression pump on right lower extremity for 60 minutes, twice daily. Wound Treatment Wound #5 - Lower Leg Wound Laterality: Right, Anterior Cleanser: Normal Saline 1 x Per Week/30 Days Discharge Instructions: Wash your hands with soap and water. Remove old dressing, discard into plastic bag and place into trash. Cleanse the wound with Normal Saline prior to applying a clean dressing using gauze sponges, not tissues or cotton balls. Do not scrub or use excessive force. Pat dry using gauze sponges, not tissue or cotton balls. Primary Dressing: Silvercel Small 2x2 (in/in) 1 x Per Week/30 Days Discharge Instructions: Apply Silvercel Small 2x2 (in/in) as instructed Secondary Dressing: ABD Pad 5x9 (in/in) 1 x Per Week/30 Days Discharge Instructions: Cover with ABD pad Compression Wrap: Medichoice 4 layer Compression System, 35-40 mmHG 1 x Per Week/30 Days Discharge Instructions: Apply multi-layer wrap as directed. Electronic Signature(s) Signed: 04/11/2020 7:06:18 PM By: Lenda Kelp PA-C Signed: 04/17/2020 5:08:01 PM By: Yevonne Pax RN Entered By: Yevonne Pax on 04/11/2020 09:37:46 Jeffrey Lin, Jeffrey Lin ([id number]) -------------------------------------------------------------------------------- Problem List Details Patient Name: Jeffrey Lin Date of Service: 04/11/2020 8:30 AM Medical Record Number: [id number] Patient Account Number: [id number] Date of Birth/Sex: 02/14/1975 (45 y.o. M) Treating RN: Rogers Blocker Primary Care Provider: Kerby Nora Other Clinician: Lolita Cram Referring Provider: Kerby Nora Treating Provider/Extender: Rowan Blase in Treatment: 0 Active Problems ICD-10 Encounter Code Description Active Date MDM Diagnosis I89.0 Lymphedema, not elsewhere classified 04/11/2020 No Yes L97.812 Non-pressure chronic ulcer of other  part of right lower leg with fat layer 04/11/2020 No Yes exposed I10 Essential (primary) hypertension 04/11/2020 No Yes Inactive Problems Resolved Problems Electronic Signature(s) Signed: 04/11/2020 9:11:33 AM By: Lenda Kelp PA-C Entered By: Lenda Kelp on 04/11/2020 09:11:33 Jeffrey Lin, Jeffrey Lin ([id number]) -------------------------------------------------------------------------------- Progress Note Details Patient Name: Jeffrey Lin Date of Service: 04/11/2020 8:30 AM Medical Record Number: [id number] Patient Account Number: [id number] Date of Birth/Sex: 06/15/1975 (45 y.o. M) Treating RN: Rogers Blocker Primary Care Provider: Kerby Nora Other Clinician: Lolita Cram Referring Provider: Kerby Nora Treating Provider/Extender: Rowan Blase in Treatment: 0 Subjective Chief Complaint Information obtained from Patient Bilateral LE Lymphedema History of Present Illness (HPI) 09/20/2019 upon evaluation today patient presents for initial evaluation in our clinic concerning issues that he has been having with wounds over the bilateral lower extremities. Both legs seem to be fairly equally affected at this point based on what I am seeing. With that being said the patient also appears to be having some drainage which is somewhat blue/green in nature and does have me concerned about the possibility of infection. I explained this often can indicate a Pseudomonas infection despite the fact that the patient has been on antibiotics currently this could still be problematic. He currently does not have home health that is something that we did discussed the potential for looking into. I think that could be beneficial for him to be honest. The patient seems somewhat frustrated with the situation in general. He does have a history of lymphedema, hypertension, and  he does ambulate and walk though not a lot and tells me that he really does not drive unless is absolutely forced for  something he absolutely has to do otherwise he is essentially homebound at this point. He did have a believe his mother with him today. 09/28/2019 on evaluation today patient appears to be doing better in regard to his leg ulcers at this point. The antibiotic does seem to be helping and overall he tells me that his pain is less though he still is having discomfort. Fortunately there is no signs of active infection at this time. 10/04/2019 upon evaluation today patient appears to be doing well at this point in regard to his wounds. He in fact is improving dramatically from the standpoint of his infection which I think is under great control. I see no evidence of infection systemically at this point and overall I am extremely pleased I think he is getting close to resolution to be honest though he still has several scattered areas at this point. 10/12/2019 upon evaluation today patient actually seems to be making excellent progress in regard to his leg ulcers at this point. He just has a small area on the left posterior and a small area on the right anterior leg that are still open. Everything else has completely resolved and seems to have done excellent up to this point. 10/19/2019 upon evaluation today patient actually appears to be doing much better on the right leg with regard to the open wounds in fact there is nothing remaining at this point which is great news. Overall I am very pleased with where things stand. I do believe that the compression has helped he still has edema which is quite significant I still believe lymphedema pumps would be beneficial for him. With regard to the left leg he still has again the edema and this is nonetheless quite significant despite compression therapy but still I do believe that he is doing much better with the compression I do believe again lymphedema pumps would be of benefit in this regard as well. He has been trying to elevate and move around/exercise much as  possible but nonetheless I still think that the compression which he does have his compression socks today as well as lymphedema pumps would greatly benefit him as far as preventing future ulcerations/blisters that would lead to further expensive wound care visits. 10/25/2019 on evaluation today patient appears to be doing excellent in regard to his legs bilaterally. In fact there is only one small area on the posterior aspect of his left leg that appears to be an issue at this time. Fortunately there is no signs of active infection which is great news. No fevers, chills, nausea, vomiting, or diarrhea. I do believe that for all intents purposes this is completely healed I think it would be appropriate for [place] to make a referral to the lymphedema clinic for him at this point. 11/02/2019 on evaluation today patient appears to be doing well with regard to his legs bilaterally. I do not see anything that remains open at this point he still has swelling but he does have new compression socks that he got as well. Fortunately there is no signs of active infection at this time which is great news. We did make a referral to lymphedema clinic he has not heard from them yet but I did recommend him giving them a call and we can give him the number for that today. 11/16/2019 upon evaluation today patient appears to be  doing well with regard to his right leg. Unfortunately his left leg has started to weep and drain again he had a couple blister areas that are of concern at this point as well. Subsequently I do believe that the patient would benefit from likely reinitiation of compression wrapping of the left leg based on what I am seeing currently. Fortunately there is no signs of active infection at this time. 11/23/2019 upon evaluation today patient actually appears to be making some good progress in regard to his lower extremities. Fortunately there is no evidence of active infection which is great news and  overall I am extremely pleased with where things stand today. The patient may still have a couple areas that are leaking on the left leg but in general he has improved dramatically. Of note he did receive his lymphedema pumps this week and is supposed to have somebody coming out to show him how to use them and get them set up for him later this week. 11/29/2019 upon evaluation today patient appears to be doing very well in regard to his leg ulcers. In fact the left leg and the right leg both appear to be for the most part closed his right leg is a little bit more swollen and again we have not been wrapping this so that may be the reason. Nonetheless I do believe that the patient may benefit from wrapping both legs 1 more week just to make sure nothing reopens even though it seems to be doing quite well I am hopeful he will be ready for discharge at that point next week. 12/07/2019 upon evaluation today patient appears to be doing excellent in regard to his lower extremities. In fact it does appear today that everything is completely closed he is healed and doing very well. He tells me that the lymphedema pump wrap will be coming out on Friday she was supposed to come today but was not feeling well therefore she is coming Friday to get him set up with his pumps. That will obviously be very beneficial for him. Readmission: Jeffrey Lin, Jeffrey Lin ([id number]) 04/11/2020 upon evaluation today patient presents for reevaluation with regard to wound on his right leg which he tells me just recently reopened. He is a patient of mine from previous that has done quite well over the past several months. He is been using his lymphedema pumps as well as his compression stockings. He tells me that that has kept things under control although they have never really been as small as they were when he was here in the clinic being seen previous. Nonetheless he tells me that since November he is really had no significant issues  just in the past week or so with the current wound. Patient History Information obtained from Patient. Allergies Toradol (Severity: Severe, Reaction: swelling), Lamotrigine (Reaction: rash) Family History Cancer - Maternal Grandparents, Diabetes - Mother, Hypertension - Mother, Lung Disease - Father, Seizures - Siblings, Stroke - Paternal Grandparents, No family history of Heart Disease, Hereditary Spherocytosis, Kidney Disease, Thyroid Problems, Tuberculosis. Social History Current every day smoker, Marital Status - Single, Alcohol Use - Rarely, Drug Use - No History, Caffeine Use - Daily. Medical History Eyes Denies history of Cataracts, Glaucoma, Optic Neuritis Ear/Nose/Mouth/Throat Denies history of Chronic sinus problems/congestion, Middle ear problems Hematologic/Lymphatic Denies history of Anemia, Hemophilia, Human Immunodeficiency Virus, Lymphedema, Sickle Cell Disease Respiratory Denies history of Aspiration, Asthma, Chronic Obstructive Pulmonary Disease (COPD), Pneumothorax, Sleep Apnea, Tuberculosis Cardiovascular Patient has history of Hypertension Denies history  of Angina, Arrhythmia, Congestive Heart Failure, Coronary Artery Disease, Deep Vein Thrombosis, Hypotension, Myocardial Infarction, Peripheral Arterial Disease, Peripheral Venous Disease, Phlebitis, Vasculitis Gastrointestinal Denies history of Cirrhosis , Colitis, Crohn s, Hepatitis A, Hepatitis B, Hepatitis C Endocrine Denies history of Type I Diabetes, Type II Diabetes Genitourinary Denies history of End Stage Renal Disease Immunological Denies history of Lupus Erythematosus, Raynaud s, Scleroderma Integumentary (Skin) Denies history of History of Burn, History of pressure wounds Musculoskeletal Denies history of Gout, Rheumatoid Arthritis, Osteoarthritis, Osteomyelitis Neurologic Denies history of Dementia, Neuropathy, Quadriplegia, Paraplegia, Seizure Disorder Oncologic Denies history of Received  Chemotherapy, Received Radiation Psychiatric Denies history of Anorexia/bulimia, Confinement Anxiety Review of Systems (ROS) Constitutional Symptoms (General Health) Denies complaints or symptoms of Fatigue, Fever, Chills, Marked Weight Change. Eyes Denies complaints or symptoms of Dry Eyes, Vision Changes, Glasses / Contacts. Ear/Nose/Mouth/Throat Denies complaints or symptoms of Difficult clearing ears, Sinusitis. Hematologic/Lymphatic Denies complaints or symptoms of Bleeding / Clotting Disorders, Human Immunodeficiency Virus. Respiratory Denies complaints or symptoms of Chronic or frequent coughs, Shortness of Breath. Cardiovascular Denies complaints or symptoms of Chest pain, LE edema. Gastrointestinal Denies complaints or symptoms of Frequent diarrhea, Nausea, Vomiting. Endocrine Denies complaints or symptoms of Hepatitis, Thyroid disease, Polydypsia (Excessive Thirst). Genitourinary Denies complaints or symptoms of Kidney failure/ Dialysis, Incontinence/dribbling. Immunological Denies complaints or symptoms of Hives, Itching. Integumentary (Skin) Denies complaints or symptoms of Wounds, Bleeding or bruising tendency, Breakdown, Swelling. Musculoskeletal Denies complaints or symptoms of Muscle Pain, Muscle Weakness. Jeffrey Lin, Jeffrey Lin ([id number]) Neurologic Denies complaints or symptoms of Numbness/parasthesias, Focal/Weakness. Psychiatric Denies complaints or symptoms of Anxiety, Claustrophobia. Objective Constitutional patient is hypertensive.. pulse regular and within target range for patient.Marland Kitchen respirations regular, non-labored and within target range for patient.Marland Kitchen temperature within target range for patient.. Well-nourished and well-hydrated in no acute distress. Vitals Time Taken: 8:32 AM, Height: 78 in, Source: Stated, Weight: 280 lbs, Source: Stated, BMI: 32.4, Temperature: 97.8 F, Pulse: 87 bpm, Respiratory Rate: 18 breaths/min, Blood Pressure: 143/94  mmHg. Eyes conjunctiva clear no eyelid edema noted. pupils equal round and reactive to light and accommodation. Ears, Nose, Mouth, and Throat no gross abnormality of ear auricles or external auditory canals. normal hearing noted during conversation. mucus membranes moist. Respiratory normal breathing without difficulty. Cardiovascular 2+ dorsalis pedis/posterior tibialis pulses. no clubbing, cyanosis, significant edema, Musculoskeletal normal gait and posture. no significant deformity or arthritic changes, no loss or range of motion, no clubbing. Psychiatric this patient is able to make decisions and demonstrates good insight into disease process. Alert and Oriented x 3. pleasant and cooperative. General Notes: Patient's wound bed showed signs of good granulation epithelization at this point. There does not appear to be any signs of active infection which is great news and overall very pleased with where things stand today. He has been tolerating the dressing changes and in general I feel like he is making good progress here. No fevers, chills, nausea, vomiting, or diarrhea. He does have stage III lymphedema bilaterally but to be honest he seems to be controlling this quite well with the pumps as well as the compression wraps. No sharp debridement was necessary today. Integumentary (Hair, Skin) Wound #5 status is Open. Original cause of wound was Gradually Appeared. The date acquired was: 04/03/2020. The wound is located on the Right,Anterior Lower Leg. The wound measures 0.4cm length x 2.5cm width x 0.1cm depth; 0.785cm^2 area and 0.079cm^3 volume. There is Fat Layer (Subcutaneous Tissue) exposed. There is no tunneling or undermining noted. There is a medium amount  of serosanguineous drainage noted. There is large (67-100%) red granulation within the wound bed. There is no necrotic tissue within the wound bed. Assessment Active Problems ICD-10 Lymphedema, not elsewhere  classified Non-pressure chronic ulcer of other part of right lower leg with fat layer exposed Essential (primary) hypertension Procedures Wound #5 Pre-procedure diagnosis of Wound #5 is a Lymphedema located on the Right,Anterior Lower Leg . There was a Four Layer Compression Therapy Procedure with a pre-treatment ABI of 0.8 by Rogers Blocker, RN. Jeffrey Lin, Jeffrey Lin ([id number]) Post procedure Diagnosis Wound #5: Same as Pre-Procedure Plan Follow-up Appointments: Return Appointment in 1 week. Nurse Visit as needed - friday Bathing/ Shower/ Hygiene: May shower with wound dressing protected with water repellent cover or cast protector. Edema Control - Lymphedema / Segmental Compressive Device / Other: Optional: One layer of unna paste to top of compression wrap (to act as an anchor). 4 Layer Compression System (Left, Right, Bilateral) Lymphedema. Compression Pump: Use compression pump on left lower extremity for 60 minutes, twice daily. Compression Pump: Use compression pump on right lower extremity for 60 minutes, twice daily. WOUND #5: - Lower Leg Wound Laterality: Right, Anterior Cleanser: Normal Saline 1 x Per Week/30 Days Discharge Instructions: Wash your hands with soap and water. Remove old dressing, discard into plastic bag and place into trash. Cleanse the wound with Normal Saline prior to applying a clean dressing using gauze sponges, not tissues or cotton balls. Do not scrub or use excessive force. Pat dry using gauze sponges, not tissue or cotton balls. Primary Dressing: Silvercel Small 2x2 (in/in) 1 x Per Week/30 Days Discharge Instructions: Apply Silvercel Small 2x2 (in/in) as instructed Secondary Dressing: ABD Pad 5x9 (in/in) 1 x Per Week/30 Days Discharge Instructions: Cover with ABD pad Compression Wrap: Medichoice 4 layer Compression System, 35-40 mmHG 1 x Per Week/30 Days Discharge Instructions: Apply multi-layer wrap as directed. 1. Would go ahead and recommend that we  initiate treatment with a continuation of what we did before that did very well for him we will use a silver alginate dressing. 2. Subsequently were also can initiate the 4-layer compression wrap that did extremely well for him in the past. 3. I would also recommend he continue to elevate his legs and uses compression on his left leg. Also suggest that he should go ahead and continue with the lymphedema pumps as well. We will see patient back for reevaluation in 1 week here in the clinic. If anything worsens or changes patient will contact our office for additional recommendations. Electronic Signature(s) Signed: 04/11/2020 7:02:40 PM By: Lenda Kelp PA-C Entered By: Lenda Kelp on 04/11/2020 19:02:40 Jeffrey Lin, Jeffrey Lin ([id number]) -------------------------------------------------------------------------------- ROS/PFSH Details Patient Name: Jeffrey Lin Date of Service: 04/11/2020 8:30 AM Medical Record Number: [id number] Patient Account Number: [id number] Date of Birth/Sex: [birth year] (45 y.o. M) Treating RN: Rogers Blocker Primary Care Provider: Kerby Nora Other Clinician: Lolita Cram Referring Provider: Kerby Nora Treating Provider/Extender: Rowan Blase in Treatment: 0 Information Obtained From Patient Constitutional Symptoms (General Health) Complaints and Symptoms: Negative for: Fatigue; Fever; Chills; Marked Weight Change Eyes Complaints and Symptoms: Negative for: Dry Eyes; Vision Changes; Glasses / Contacts Medical History: Negative for: Cataracts; Glaucoma; Optic Neuritis Ear/Nose/Mouth/Throat Complaints and Symptoms: Negative for: Difficult clearing ears; Sinusitis Medical History: Negative for: Chronic sinus problems/congestion; Middle ear problems Hematologic/Lymphatic Complaints and Symptoms: Negative for: Bleeding / Clotting Disorders; Human Immunodeficiency Virus Medical History: Negative for: Anemia; Hemophilia; Human Immunodeficiency  Virus; Lymphedema; Sickle Cell Disease Respiratory Complaints and Symptoms:  Negative for: Chronic or frequent coughs; Shortness of Breath Medical History: Negative for: Aspiration; Asthma; Chronic Obstructive Pulmonary Disease (COPD); Pneumothorax; Sleep Apnea; Tuberculosis Cardiovascular Complaints and Symptoms: Negative for: Chest pain; LE edema Medical History: Positive for: Hypertension Negative for: Angina; Arrhythmia; Congestive Heart Failure; Coronary Artery Disease; Deep Vein Thrombosis; Hypotension; Myocardial Infarction; Peripheral Arterial Disease; Peripheral Venous Disease; Phlebitis; Vasculitis Gastrointestinal Complaints and Symptoms: Negative for: Frequent diarrhea; Nausea; Vomiting Medical History: Negative for: Cirrhosis ; Colitis; Crohnos; Hepatitis A; Hepatitis B; Hepatitis C Endocrine July, Jeffrey G. ([id number]) Complaints and Symptoms: Negative for: Hepatitis; Thyroid disease; Polydypsia (Excessive Thirst) Medical History: Negative for: Type I Diabetes; Type II Diabetes Genitourinary Complaints and Symptoms: Negative for: Kidney failure/ Dialysis; Incontinence/dribbling Medical History: Negative for: End Stage Renal Disease Immunological Complaints and Symptoms: Negative for: Hives; Itching Medical History: Negative for: Lupus Erythematosus; Raynaudos; Scleroderma Integumentary (Skin) Complaints and Symptoms: Negative for: Wounds; Bleeding or bruising tendency; Breakdown; Swelling Medical History: Negative for: History of Burn; History of pressure wounds Musculoskeletal Complaints and Symptoms: Negative for: Muscle Pain; Muscle Weakness Medical History: Negative for: Gout; Rheumatoid Arthritis; Osteoarthritis; Osteomyelitis Neurologic Complaints and Symptoms: Negative for: Numbness/parasthesias; Focal/Weakness Medical History: Negative for: Dementia; Neuropathy; Quadriplegia; Paraplegia; Seizure Disorder Psychiatric Complaints and  Symptoms: Negative for: Anxiety; Claustrophobia Medical History: Negative for: Anorexia/bulimia; Confinement Anxiety Oncologic Medical History: Negative for: Received Chemotherapy; Received Radiation Immunizations Pneumococcal Vaccine: Received Pneumococcal Vaccination: No Implantable Devices None Family and Social History Cancer: Yes - Maternal Grandparents; Diabetes: Yes - Mother; Heart Disease: No; Hereditary Spherocytosis: No; Hypertension: Yes - Mother; Kidney Disease: No; Lung Disease: Yes - Father; Seizures: Yes - Siblings; Stroke: Yes - Paternal Grandparents; Thyroid Problems: No; Jeffrey Lin, Jeffrey G. ([id number]) Tuberculosis: No; Current every day smoker; Marital Status - Single; Alcohol Use: Rarely; Drug Use: No History; Caffeine Use: Daily; Financial Concerns: No; Food, Clothing or Shelter Needs: No; Support System Lacking: No; Transportation Concerns: No Electronic Signature(s) Signed: 04/11/2020 3:37:26 PM By: Lolita Cram Signed: 04/11/2020 5:09:07 PM By: Phillis Haggis, Dondra Prader RN Signed: 04/11/2020 7:06:18 PM By: Lenda Kelp PA-C Entered By: Lolita Cram on 04/11/2020 08:55:17 Jeffrey Lin, Jeffrey Lin ([id number]) -------------------------------------------------------------------------------- SuperBill Details Patient Name: Jeffrey Lin Date of Service: 04/11/2020 Medical Record Number: [id number] Patient Account Number: [id number] Date of Birth/Sex: [birth year] (45 y.o. M) Treating RN: Rogers Blocker Primary Care Provider: Kerby Nora Other Clinician: Lolita Cram Referring Provider: Kerby Nora Treating Provider/Extender: Rowan Blase in Treatment: 0 Diagnosis Coding ICD-10 Codes Code Description I89.0 Lymphedema, not elsewhere classified L97.812 Non-pressure chronic ulcer of other part of right lower leg with fat layer exposed I10 Essential (primary) hypertension Facility Procedures CPT4 Code: 70623762 Description: 99213 - WOUND CARE VISIT-LEV 3  EST PT Modifier: Quantity: 1 CPT4 Code: 83151761 Description: (Facility Use Only) 60737TG - APPLY MULTLAY COMPRS LWR RT LEG Modifier: Quantity: 1 Physician Procedures CPT4 Code: 6269485 Description: 99213 - WC PHYS LEVEL 3 - EST PT Modifier: Quantity: 1 CPT4 Code: Description: ICD-10 Diagnosis Description I89.0 Lymphedema, not elsewhere classified L97.812 Non-pressure chronic ulcer of other part of right lower leg with fat la I10 Essential (primary) hypertension Modifier: yer exposed Quantity: Electronic Signature(s) Signed: 04/11/2020 7:02:57 PM By: Lenda Kelp PA-C Previous Signature: 04/11/2020 5:09:07 PM Version By: Phillis Haggis, Dondra Prader RN Entered By: Lenda Kelp on 04/11/2020 19:02:56

## 2020-04-17 NOTE — Progress Notes (Signed)
SHRAY, HUNLEY ([id number]) Visit Report for 04/11/2020 Allergy List Details Patient Name: Jeffrey Lin, Jeffrey Lin Date of Service: 04/11/2020 8:30 AM Medical Record Number: [id number] Patient Account Number: [id number] Date of Birth/Sex: [birth year] (45 y.o. M) Treating RN: Rogers Blocker Primary Care Rahm Minix: Kerby Nora Other Clinician: Lolita Cram Referring Madelena Maturin: Kerby Nora Treating Wilbert Hayashi/Extender: Allen Derry Weeks in Treatment: 0 Allergies Active Allergies Toradol Reaction: swelling Severity: Severe Type: Allergen Lamotrigine Reaction: rash Type: Allergen Allergy Notes Electronic Signature(s) Signed: 04/11/2020 3:37:26 PM By: Lolita Cram Entered By: Lolita Cram on 04/11/2020 08:33:13 Jeffrey Lin, Jeffrey Lin ([id number]) -------------------------------------------------------------------------------- Arrival Information Details Patient Name: Jeffrey Lin Date of Service: 04/11/2020 8:30 AM Medical Record Number: [id number] Patient Account Number: [id number] Date of Birth/Sex: Feb 20, 1975 (45 y.o. M) Treating RN: Rogers Blocker Primary Care Travus Oren: Kerby Nora Other Clinician: Lolita Cram Referring Amirra Herling: Kerby Nora Treating Fabianna Keats/Extender: Rowan Blase in Treatment: 0 Visit Information Patient Arrived: Ambulatory Arrival Time: 08:29 Accompanied By: self Transfer Assistance: None Patient Identification Verified: Yes Secondary Verification Process Completed: Yes Patient Has Alerts: Yes Patient Alerts: ABI left .88 09/20/2019 ABI right .81 09/20/2019 History Since Last Visit Electronic Signature(s) Signed: 04/11/2020 3:37:26 PM By: Lolita Cram Entered By: Lolita Cram on 04/11/2020 08:56:11 Jeffrey Lin, Jeffrey Lin ([id number]) -------------------------------------------------------------------------------- Clinic Level of Care Assessment Details Patient Name: Jeffrey Lin Date of Service: 04/11/2020 8:30 AM Medical Record Number:  [id number] Patient Account Number: [id number] Date of Birth/Sex: Jan 26, 1975 (44 y.o. M) Treating RN: Rogers Blocker Primary Care Kelton Bultman: Kerby Nora Other Clinician: Lolita Cram Referring Detra Bores: Kerby Nora Treating Aksh Swart/Extender: Rowan Blase in Treatment: 0 Clinic Level of Care Assessment Items TOOL 4 Quantity Score X - Use when only an EandM is performed on FOLLOW-UP visit 1 0 ASSESSMENTS - Nursing Assessment / Reassessment X - Reassessment of Co-morbidities (includes updates in patient status) 1 10 X- 1 5 Reassessment of Adherence to Treatment Plan ASSESSMENTS - Wound and Skin Assessment / Reassessment X - Simple Wound Assessment / Reassessment - one wound 1 5 []  - 0 Complex Wound Assessment / Reassessment - multiple wounds []  - 0 Dermatologic / Skin Assessment (not related to wound area) ASSESSMENTS - Focused Assessment X - Circumferential Edema Measurements - multi extremities 1 5 []  - 0 Nutritional Assessment / Counseling / Intervention []  - 0 Lower Extremity Assessment (monofilament, tuning fork, pulses) []  - 0 Peripheral Arterial Disease Assessment (using hand held doppler) ASSESSMENTS - Ostomy and/or Continence Assessment and Care []  - Incontinence Assessment and Management 0 []  - 0 Ostomy Care Assessment and Management (repouching, etc.) PROCESS - Coordination of Care X - Simple Patient / Family Education for ongoing care 1 15 []  - 0 Complex (extensive) Patient / Family Education for ongoing care []  - 0 Staff obtains , Records, Test Results / Process Orders []  - 0 Staff telephones HHA, Nursing Homes / Clarify orders / etc []  - 0 Routine Transfer to another Facility (non-emergent condition) []  - 0 Routine Hospital Admission (non-emergent condition) []  - 0 New Admissions / / Ordering NPWT, Apligraf, etc. []  - 0 Emergency Hospital Admission (emergent condition) X- 1 10 Simple Discharge Coordination []  -  0 Complex (extensive) Discharge Coordination PROCESS - Special Needs []  - Pediatric / Minor Patient Management 0 []  - 0 Isolation Patient Management []  - 0 Hearing / Language / Visual special needs []  - 0 Assessment of Community assistance (transportation, D/C planning, etc.) []  - 0 Additional assistance / Altered mentation []  - 0 Support Surface(s) Assessment (bed, cushion, seat,  etc.) INTERVENTIONS - Wound Cleansing / Measurement Jeffrey Lin, Jeffrey G. ([id number]) X- 1 5 Simple Wound Cleansing - one wound []  - 0 Complex Wound Cleansing - multiple wounds X- 1 5 Wound Imaging (photographs - any number of wounds) []  - 0 Wound Tracing (instead of photographs) X- 1 5 Simple Wound Measurement - one wound []  - 0 Complex Wound Measurement - multiple wounds INTERVENTIONS - Wound Dressings []  - Small Wound Dressing one or multiple wounds 0 X- 1 15 Medium Wound Dressing one or multiple wounds []  - 0 Large Wound Dressing one or multiple wounds []  - 0 Application of Medications - topical []  - 0 Application of Medications - injection INTERVENTIONS - Miscellaneous []  - External ear exam 0 []  - 0 Specimen Collection (cultures, biopsies, blood, body fluids, etc.) []  - 0 Specimen(s) / Culture(s) sent or taken to Lab for analysis []  - 0 Patient Transfer (multiple staff / Nurse, adultHoyer Lift / Similar devices) []  - 0 Simple Staple / Suture removal (25 or less) []  - 0 Complex Staple / Suture removal (26 or more) []  - 0 Hypo / Hyperglycemic Management (close monitor of Blood Glucose) []  - 0 Ankle / Brachial Index (ABI) - do not check if billed separately X- 1 5 Vital Signs Has the patient been seen at the hospital within the last three years: Yes Total Score: 85 Level Of Care: New/Established - Level 3 Electronic Signature(s) Signed: 04/11/2020 5:09:07 PM By: Phillis HaggisSanchez Pereyda, Dondra PraderKenia RN Entered By: Phillis HaggisSanchez Pereyda, Dondra PraderKenia on 04/11/2020 09:18:36 Jeffrey GillisSTEWART, Jeffrey G.  ([id number]) -------------------------------------------------------------------------------- Compression Therapy Details Patient Name: Jeffrey GillisSTEWART, Jeffrey G. Date of Service: 04/11/2020 8:30 AM Medical Record Number: [id number] Patient Account Number: [id number] Date of Birth/Sex: 03/07/1975 (45 y.o. M) Treating RN: Rogers BlockerSanchez, Kenia Primary Care Ashlen Kiger: Kerby NoraBedsole, Amy Other Clinician: Lolita CramBurnette, Kyara Referring Cartier Washko: Kerby NoraBedsole, Amy Treating Kniyah Khun/Extender: Allen DerryStone, Hoyt Weeks in Treatment: 0 Compression Therapy Performed for Wound Assessment: Wound #5 Right,Anterior Lower Leg Performed By: Ovidio Hangerlinician Sanchez, Kenia, RN Compression Type: Four Layer Pre Treatment ABI: 0.8 Post Procedure Diagnosis Same as Pre-procedure Electronic Signature(s) Signed: 04/11/2020 5:09:07 PM By: Phillis HaggisSanchez Pereyda, Dondra PraderKenia RN Entered By: Phillis HaggisSanchez Pereyda, Dondra PraderKenia on 04/11/2020 09:15:52 Jeffrey GillisSTEWART, Jeffrey G. ([id number]) -------------------------------------------------------------------------------- Encounter Discharge Information Details Patient Name: Jeffrey GillisSTEWART, Jeffrey G. Date of Service: 04/11/2020 8:30 AM Medical Record Number: [id number] Patient Account Number: [id number] Date of Birth/Sex: 03/19/1975 (45 y.o. M) Treating RN: Yevonne PaxEpps, Carrie Primary Care Mallika Sanmiguel: Kerby NoraBedsole, Amy Other Clinician: Lolita CramBurnette, Kyara Referring Caeden Foots: Kerby NoraBedsole, Amy Treating Genever Hentges/Extender: Rowan BlaseStone, Hoyt Weeks in Treatment: 0 Encounter Discharge Information Items Discharge Condition: Stable Ambulatory Status: Ambulatory Discharge Destination: Home Transportation: Private Auto Accompanied By: self Schedule Follow-up Appointment: Yes Clinical Summary of Care: Patient Declined Electronic Signature(s) Signed: 04/17/2020 5:08:01 PM By: Yevonne PaxEpps, Carrie RN Entered By: Yevonne PaxEpps, Carrie on 04/11/2020 09:38:52 Jeffrey Lin, Deeann SaintBOBBY G. ([id number]) -------------------------------------------------------------------------------- Lower Extremity Assessment Details Patient  Name: Jeffrey GillisSTEWART, Jeffrey G. Date of Service: 04/11/2020 8:30 AM Medical Record Number: [id number] Patient Account Number: [id number] Date of Birth/Sex: 08/22/1975 (45 y.o. M) Treating RN: Rogers BlockerSanchez, Kenia Primary Care Malvern Kadlec: Kerby NoraBedsole, Amy Other Clinician: Lolita CramBurnette, Kyara Referring Laine Giovanetti: Kerby NoraBedsole, Amy Treating Anderson Coppock/Extender: Allen DerryStone, Hoyt Weeks in Treatment: 0 Edema Assessment Assessed: [Left: No] [Right: No] Edema: [Left: Ye] [Right: s] Calf Left: Right: Point of Measurement: 46 cm From Medial Instep 53 cm Ankle Left: Right: Point of Measurement: 12 cm From Medial Instep 43 cm Knee To Floor Left: Right: From Medial Instep 53 cm Vascular Assessment Pulses: Dorsalis Pedis Palpable: [Right:Yes] Electronic Signature(s) Signed: 04/11/2020 5:09:07 PM By: Phillis HaggisSanchez Pereyda,  Dondra Prader RN Signed: 04/17/2020 5:08:01 PM By: Yevonne Pax RN Entered By: Yevonne Pax on 04/11/2020 08:56:57 Jeffrey Lin, Jeffrey Lin ([id number]) -------------------------------------------------------------------------------- Multi Wound Chart Details Patient Name: Jeffrey Lin Date of Service: 04/11/2020 8:30 AM Medical Record Number: [id number] Patient Account Number: [id number] Date of Birth/Sex: 09-28-1975 (45 y.o. M) Treating RN: Rogers Blocker Primary Care Kween Bacorn: Kerby Nora Other Clinician: Lolita Cram Referring Json Koelzer: Kerby Nora Treating Marinna Blane/Extender: Rowan Blase in Treatment: 0 Vital Signs Height(in): 78 Pulse(bpm): 87 Weight(lbs): 280 Blood Pressure(mmHg): 143/94 Body Mass Index(BMI): 32 Temperature(F): 97.8 Respiratory Rate(breaths/min): 18 Photos: [N/A:N/A] Wound Location: Right, Anterior Lower Leg N/A N/A Wounding Event: Gradually Appeared N/A N/A Primary Etiology: Lymphedema N/A N/A Comorbid History: Hypertension N/A N/A Date Acquired: 04/03/2020 N/A N/A Weeks of Treatment: 0 N/A N/A Wound Status: Open N/A N/A Measurements L x W x D (cm) 0.4x2.5x0.1 N/A N/A Area (cm) :  0.785 N/A N/A Volume (cm) : 0.079 N/A N/A % Reduction in Area: 0.00% N/A N/A % Reduction in Volume: 0.00% N/A N/A Classification: Full Thickness Without Exposed N/A N/A Support Structures Exudate Amount: Medium N/A N/A Exudate Type: Serosanguineous N/A N/A Exudate Color: red, brown N/A N/A Granulation Amount: Large (67-100%) N/A N/A Granulation Quality: Red N/A N/A Necrotic Amount: None Present (0%) N/A N/A Exposed Structures: Fat Layer (Subcutaneous Tissue): N/A N/A Yes Fascia: No Tendon: No Muscle: No Joint: No Bone: No Epithelialization: None N/A N/A Treatment Notes Electronic Signature(s) Signed: 04/11/2020 5:09:07 PM By: Phillis Haggis, Dondra Prader RN Entered By: Phillis Haggis, Dondra Prader on 04/11/2020 09:14:50 Jeffrey Lin ([id number]) -------------------------------------------------------------------------------- Multi-Disciplinary Care Plan Details Patient Name: Jeffrey Lin Date of Service: 04/11/2020 8:30 AM Medical Record Number: [id number] Patient Account Number: [id number] Date of Birth/Sex: July 10, 1975 (45 y.o. M) Treating RN: Rogers Blocker Primary Care Azaylia Fong: Kerby Nora Other Clinician: Lolita Cram Referring Shaley Leavens: Kerby Nora Treating Epifania Littrell/Extender: Rowan Blase in Treatment: 0 Active Inactive Orientation to the Wound Care Program Nursing Diagnoses: Knowledge deficit related to the wound healing center program Goals: Patient/caregiver will verbalize understanding of the Wound Healing Center Program Date Initiated: 04/11/2020 Target Resolution Date: 04/11/2020 Goal Status: Active Interventions: Provide education on orientation to the wound center Notes: Wound/Skin Impairment Nursing Diagnoses: Impaired tissue integrity Goals: Patient/caregiver will verbalize understanding of skin care regimen Date Initiated: 04/11/2020 Target Resolution Date: 04/11/2020 Goal Status: Active Ulcer/skin breakdown will have a volume reduction of 30%  by week 4 Date Initiated: 04/11/2020 Target Resolution Date: 05/12/2020 Goal Status: Active Ulcer/skin breakdown will have a volume reduction of 50% by week 8 Date Initiated: 04/11/2020 Target Resolution Date: 06/11/2020 Goal Status: Active Ulcer/skin breakdown will have a volume reduction of 80% by week 12 Date Initiated: 04/11/2020 Target Resolution Date: 07/12/2020 Goal Status: Active Interventions: Assess patient/caregiver ability to obtain necessary supplies Assess patient/caregiver ability to perform ulcer/skin care regimen upon admission and as needed Assess ulceration(s) every visit Provide education on ulcer and skin care Treatment Activities: Skin care regimen initiated : 04/11/2020 Notes: Electronic Signature(s) Signed: 04/11/2020 5:09:07 PM By: Phillis Haggis, Dondra Prader RN Entered By: Phillis Haggis, Dondra Prader on 04/11/2020 09:14:34 Jeffrey Lin ([id number]) -------------------------------------------------------------------------------- Pain Assessment Details Patient Name: Jeffrey Lin Date of Service: 04/11/2020 8:30 AM Medical Record Number: [id number] Patient Account Number: [id number] Date of Birth/Sex: [birth year] (45 y.o. M) Treating RN: Rogers Blocker Primary Care Jaymond Waage: Kerby Nora Other Clinician: Lolita Cram Referring Kailash Hinze: Kerby Nora Treating Geovanie Winnett/Extender: Rowan Blase in Treatment: 0 Active Problems Location of Pain Severity and Description of Pain Patient Has Paino Yes Site  Locations Rate the pain. Current Pain Level: 7 Character of Pain Describe the Pain: Aching, Burning, Throbbing Pain Management and Medication Current Pain Management: Electronic Signature(s) Signed: 04/11/2020 3:37:26 PM By: Lolita Cram Signed: 04/11/2020 5:09:07 PM By: Phillis Haggis, Dondra Prader RN Entered By: Lolita Cram on 04/11/2020 08:31:58 Jeffrey Lin  ([id number]) -------------------------------------------------------------------------------- Patient/Caregiver Education Details Patient Name: Jeffrey Lin Date of Service: 04/11/2020 8:30 AM Medical Record Number: [id number] Patient Account Number: [id number] Date of Birth/Gender: 1975-02-15 (45 y.o. M) Treating RN: Rogers Blocker Primary Care Physician: Kerby Nora Other Clinician: Lolita Cram Referring Physician: Kerby Nora Treating Physician/Extender: Rowan Blase in Treatment: 0 Education Assessment Education Provided To: Patient Education Topics Provided Notes Reinforced importance of continued use of pumps Electronic Signature(s) Signed: 04/11/2020 5:09:07 PM By: Phillis Haggis, Dondra Prader RN Entered By: Phillis Haggis, Dondra Prader on 04/11/2020 09:19:05 Jeffrey Lin ([id number]) -------------------------------------------------------------------------------- Wound Assessment Details Patient Name: Jeffrey Lin Date of Service: 04/11/2020 8:30 AM Medical Record Number: [id number] Patient Account Number: [id number] Date of Birth/Sex: 08-09-1975 (45 y.o. M) Treating RN: Rogers Blocker Primary Care Caliann Leckrone: Kerby Nora Other Clinician: Lolita Cram Referring Keviana Guida: Kerby Nora Treating Jody Silas/Extender: Allen Derry Weeks in Treatment: 0 Wound Status Wound Number: 5 Primary Etiology: Lymphedema Wound Location: Right, Anterior Lower Leg Wound Status: Open Wounding Event: Gradually Appeared Comorbid History: Hypertension Date Acquired: 04/03/2020 Weeks Of Treatment: 0 Clustered Wound: No Photos Wound Measurements Length: (cm) 0.4 Width: (cm) 2.5 Depth: (cm) 0.1 Area: (cm) 0.785 Volume: (cm) 0.079 % Reduction in Area: 0% % Reduction in Volume: 0% Epithelialization: None Tunneling: No Undermining: No Wound Description Classification: Full Thickness Without Exposed Support Structu Exudate Amount: Medium Exudate Type: Serosanguineous Exudate  Color: red, brown res Foul Odor After Cleansing: No Slough/Fibrino No Wound Bed Granulation Amount: Large (67-100%) Exposed Structure Granulation Quality: Red Fascia Exposed: No Necrotic Amount: None Present (0%) Fat Layer (Subcutaneous Tissue) Exposed: Yes Tendon Exposed: No Muscle Exposed: No Joint Exposed: No Bone Exposed: No Electronic Signature(s) Signed: 04/11/2020 5:09:07 PM By: Phillis Haggis, Dondra Prader RN Signed: 04/17/2020 5:08:01 PM By: Yevonne Pax RN Entered By: Yevonne Pax on 04/11/2020 08:56:48 JAHZIR, STROHMEIER ([id number]) -------------------------------------------------------------------------------- Vitals Details Patient Name: Jeffrey Lin Date of Service: 04/11/2020 8:30 AM Medical Record Number: [id number] Patient Account Number: [id number] Date of Birth/Sex: 09-24-1975 (45 y.o. M) Treating RN: Rogers Blocker Primary Care Kaito Schulenburg: Kerby Nora Other Clinician: Lolita Cram Referring Rickesha Veracruz: Kerby Nora Treating Aryana Wonnacott/Extender: Rowan Blase in Treatment: 0 Vital Signs Time Taken: 08:32 Temperature (F): 97.8 Height (in): 78 Pulse (bpm): 87 Source: Stated Respiratory Rate (breaths/min): 18 Weight (lbs): 280 Blood Pressure (mmHg): 143/94 Source: Stated Reference Range: 80 - 120 mg / dl Body Mass Index (BMI): 32.4 Electronic Signature(s) Signed: 04/11/2020 3:37:26 PM By: Lolita Cram Entered By: Lolita Cram on 04/11/2020 08:32:37

## 2020-04-17 NOTE — Progress Notes (Signed)
SHADE, KALEY ([id number]) Visit Report for 04/14/2020 Arrival Information Details Patient Name: Jeffrey Lin Date of Service: 04/14/2020 8:00 AM Medical Record Number: [id number] Patient Account Number: [id number] Date of Birth/Sex: November 10, 1975 (45 y.o. M) Treating RN: Yevonne Pax Primary Care Graves Nipp: Kerby Nora Other Clinician: Lolita Cram Referring Yamaira Spinner: Kerby Nora Treating Aneliese Beaudry/Extender: Rowan Blase in Treatment: 0 Visit Information History Since Last Visit All ordered tests and consults were completed: No Patient Arrived: Ambulatory Added or deleted any medications: No Arrival Time: 08:23 Any new allergies or adverse reactions: No Accompanied By: self Had a fall or experienced change in No Transfer Assistance: None activities of daily living that may affect Patient Identification Verified: Yes risk of falls: Secondary Verification Process Completed: Yes Signs or symptoms of abuse/neglect since last visito No Patient Has Alerts: Yes Hospitalized since last visit: No Patient Alerts: ABI left .88 09/20/2019 Implantable device outside of the clinic excluding No ABI right .81 09/20/2019 cellular tissue based products placed in the center since last visit: Has Dressing in Place as Prescribed: Yes Has Compression in Place as Prescribed: Yes Pain Present Now: No Electronic Signature(s) Signed: 04/17/2020 5:08:01 PM By: Yevonne Pax RN Entered By: Yevonne Pax on 04/14/2020 08:23:51 Badger, Jeffrey Lin ([id number]) -------------------------------------------------------------------------------- Clinic Level of Care Assessment Details Patient Name: Jeffrey Lin Date of Service: 04/14/2020 8:00 AM Medical Record Number: [id number] Patient Account Number: [id number] Date of Birth/Sex: [birth year] (45 y.o. M) Treating RN: Yevonne Pax Primary Care Kajsa Butrum: Kerby Nora Other Clinician: Lolita Cram Referring Geoffry Bannister: Kerby Nora Treating  Jivan Symanski/Extender: Rowan Blase in Treatment: 0 Clinic Level of Care Assessment Items TOOL 1 Quantity Score []  - Use when EandM and Procedure is performed on INITIAL visit 0 ASSESSMENTS - Nursing Assessment / Reassessment []  - General Physical Exam (combine w/ comprehensive assessment (listed just below) when performed on new 0 pt. evals) []  - 0 Comprehensive Assessment (HX, ROS, Risk Assessments, Wounds Hx, etc.) ASSESSMENTS - Wound and Skin Assessment / Reassessment []  - Dermatologic / Skin Assessment (not related to wound area) 0 ASSESSMENTS - Ostomy and/or Continence Assessment and Care []  - Incontinence Assessment and Management 0 []  - 0 Ostomy Care Assessment and Management (repouching, etc.) PROCESS - Coordination of Care []  - Simple Patient / Family Education for ongoing care 0 []  - 0 Complex (extensive) Patient / Family Education for ongoing care []  - 0 Staff obtains , Records, Test Results / Process Orders []  - 0 Staff telephones HHA, Nursing Homes / Clarify orders / etc []  - 0 Routine Transfer to another Facility (non-emergent condition) []  - 0 Routine Hospital Admission (non-emergent condition) []  - 0 New Admissions / / Ordering NPWT, Apligraf, etc. []  - 0 Emergency Hospital Admission (emergent condition) PROCESS - Special Needs []  - Pediatric / Minor Patient Management 0 []  - 0 Isolation Patient Management []  - 0 Hearing / Language / Visual special needs []  - 0 Assessment of Community assistance (transportation, D/C planning, etc.) []  - 0 Additional assistance / Altered mentation []  - 0 Support Surface(s) Assessment (bed, cushion, seat, etc.) INTERVENTIONS - Miscellaneous []  - External ear exam 0 []  - 0 Patient Transfer (multiple staff / / Similar devices) []  - 0 Simple Staple / Suture removal (25 or less) []  - 0 Complex Staple / Suture removal (26 or more) []  - 0 Hypo/Hyperglycemic Management (do not  check if billed separately) []  - 0 Ankle / Brachial Index (ABI) - do not check if billed separately Has the  patient been seen at the hospital within the last three years: Yes Total Score: 0 Level Of Care: ____ Jeffrey Lin ([id number]) Electronic Signature(s) Signed: 04/17/2020 5:08:01 PM By: Yevonne Pax RN Entered By: Yevonne Pax on 04/14/2020 08:26:17 [name], [name] ([id number]) -------------------------------------------------------------------------------- Compression Therapy Details Patient Name: Jeffrey Lin Date of Service: 04/14/2020 8:00 AM Medical Record Number: [id number] Patient Account Number: [id number] Date of Birth/Sex: Apr 06, 1975 (45 y.o. M) Treating RN: Yevonne Pax Primary Care Olivia Pavelko: Kerby Nora Other Clinician: Lolita Cram Referring Santanna Whitford: Kerby Nora Treating Lizzett Nobile/Extender: Rowan Blase in Treatment: 0 Compression Therapy Performed for Wound Assessment: Wound #5 Right,Anterior Lower Leg Performed By: Clinician Yevonne Pax, RN Compression Type: Four Layer Electronic Signature(s) Signed: 04/17/2020 5:08:01 PM By: Yevonne Pax RN Entered By: Yevonne Pax on 04/14/2020 08:24:44 Jeffrey Lin ([id number]) -------------------------------------------------------------------------------- Encounter Discharge Information Details Patient Name: Jeffrey Lin Date of Service: 04/14/2020 8:00 AM Medical Record Number: [id number] Patient Account Number: [id number] Date of Birth/Sex: [birth year] (45 y.o. M) Treating RN: Yevonne Pax Primary Care Loi Rennaker: Kerby Nora Other Clinician: Lolita Cram Referring Renetta Suman: Kerby Nora Treating Sandrina Heaton/Extender: Rowan Blase in Treatment: 0 Encounter Discharge Information Items Discharge Condition: Stable Ambulatory Status: Ambulatory Discharge Destination: Home Transportation: Private Auto Accompanied By: self Schedule Follow-up Appointment: Yes Clinical Summary of Care:  Patient Declined Electronic Signature(s) Signed: 04/17/2020 5:08:01 PM By: Yevonne Pax RN Entered By: Yevonne Pax on 04/14/2020 08:25:23 Jeffrey Lin ([id number]) -------------------------------------------------------------------------------- Wound Assessment Details Patient Name: Jeffrey Lin Date of Service: 04/14/2020 8:00 AM Medical Record Number: [id number] Patient Account Number: [id number] Date of Birth/Sex: 10-04-1975 (45 y.o. M) Treating RN: Yevonne Pax Primary Care Shandon Burlingame: Kerby Nora Other Clinician: Lolita Cram Referring Laramie Gelles: Kerby Nora Treating Harley Fitzwater/Extender: Rowan Blase in Treatment: 0 Wound Status Wound Number: 5 Primary Etiology: Lymphedema Wound Location: Right, Anterior Lower Leg Wound Status: Open Wounding Event: Gradually Appeared Comorbid History: Hypertension Date Acquired: 04/03/2020 Weeks Of Treatment: 0 Clustered Wound: No Wound Measurements Length: (cm) 0.4 Width: (cm) 2.5 Depth: (cm) 0.1 Area: (cm) 0.785 Volume: (cm) 0.079 % Reduction in Area: 0% % Reduction in Volume: 0% Epithelialization: None Tunneling: No Undermining: No Wound Description Classification: Full Thickness Without Exposed Support Structures Exudate Amount: Medium Exudate Type: Serosanguineous Exudate Color: red, brown Foul Odor After Cleansing: No Slough/Fibrino No Wound Bed Granulation Amount: Large (67-100%) Exposed Structure Granulation Quality: Red Fascia Exposed: No Necrotic Amount: None Present (0%) Fat Layer (Subcutaneous Tissue) Exposed: Yes Tendon Exposed: No Muscle Exposed: No Joint Exposed: No Bone Exposed: No Treatment Notes Wound #5 (Lower Leg) Wound Laterality: Right, Anterior Cleanser Normal Saline Discharge Instruction: Wash your hands with soap and water. Remove old dressing, discard into plastic bag and place into trash. Cleanse the wound with Normal Saline prior to applying a clean dressing using gauze sponges,  not tissues or cotton balls. Do not scrub or use excessive force. Pat dry using gauze sponges, not tissue or cotton balls. Peri-Wound Care Topical Primary Dressing Silvercel Small 2x2 (in/in) Discharge Instruction: Apply Silvercel Small 2x2 (in/in) as instructed Secondary Dressing ABD Pad 5x9 (in/in) Discharge Instruction: Cover with ABD pad Secured With Compression Wrap Medichoice 4 layer Compression System, 35-40 mmHG Defilippo, Sebastion G. ([id number]) Discharge Instruction: Apply multi-layer wrap as directed. Compression Stockings Add-Ons Electronic Signature(s) Signed: 04/17/2020 5:08:01 PM By: Yevonne Pax RN Entered By: Yevonne Pax on 04/14/2020 08:24:09

## 2020-04-18 ENCOUNTER — Encounter: Payer: Medicare Other | Admitting: Physician Assistant

## 2020-04-18 ENCOUNTER — Other Ambulatory Visit: Payer: Self-pay

## 2020-04-18 DIAGNOSIS — I890 Lymphedema, not elsewhere classified: Secondary | ICD-10-CM | POA: Diagnosis not present

## 2020-04-18 NOTE — Progress Notes (Addendum)
MICHALE, EMMERICH ([id number]) Visit Report for 04/18/2020 Chief Complaint Document Details Patient Name: Jeffrey Lin, Jeffrey Lin Date of Service: 04/18/2020 9:00 AM Medical Record Number: [id number] Patient Account Number: [id number] Date of Birth/Sex: 1975-04-17 (45 y.o. M) Treating RN: Rogers Blocker Primary Care Provider: Kerby Nora Other Clinician: Lolita Cram Referring Provider: Kerby Nora Treating Provider/Extender: Rowan Blase in Treatment: 1 Information Obtained from: Patient Chief Complaint Bilateral LE Lymphedema Electronic Signature(s) Signed: 04/18/2020 9:16:37 AM By: Lenda Kelp PA-C Entered By: Lenda Kelp on 04/18/2020 09:16:37 Jeffrey Lin ([id number]) -------------------------------------------------------------------------------- HPI Details Patient Name: Jeffrey Lin Date of Service: 04/18/2020 9:00 AM Medical Record Number: [id number] Patient Account Number: [id number] Date of Birth/Sex: [birth year] (45 y.o. M) Treating RN: Rogers Blocker Primary Care Provider: Kerby Nora Other Clinician: Lolita Cram Referring Provider: Kerby Nora Treating Provider/Extender: Rowan Blase in Treatment: 1 History of Present Illness HPI Description: 09/20/2019 upon evaluation today patient presents for initial evaluation in our clinic concerning issues that he has been having with wounds over the bilateral lower extremities. Both legs seem to be fairly equally affected at this point based on what I am seeing. With that being said the patient also appears to be having some drainage which is somewhat blue/green in nature and does have me concerned about the possibility of infection. I explained this often can indicate a Pseudomonas infection despite the fact that the patient has been on antibiotics currently this could still be problematic. He currently does not have home health that is something that we did discussed the potential for looking into. I  think that could be beneficial for him to be honest. The patient seems somewhat frustrated with the situation in general. He does have a history of lymphedema, hypertension, and he does ambulate and walk though not a lot and tells me that he really does not drive unless is absolutely forced for something he absolutely has to do otherwise he is essentially homebound at this point. He did have a believe his mother with him today. 09/28/2019 on evaluation today patient appears to be doing better in regard to his leg ulcers at this point. The antibiotic does seem to be helping and overall he tells me that his pain is less though he still is having discomfort. Fortunately there is no signs of active infection at this time. 10/04/2019 upon evaluation today patient appears to be doing well at this point in regard to his wounds. He in fact is improving dramatically from the standpoint of his infection which I think is under great control. I see no evidence of infection systemically at this point and overall I am extremely pleased I think he is getting close to resolution to be honest though he still has several scattered areas at this point. 10/12/2019 upon evaluation today patient actually seems to be making excellent progress in regard to his leg ulcers at this point. He just has a small area on the left posterior and a small area on the right anterior leg that are still open. Everything else has completely resolved and seems to have done excellent up to this point. 10/19/2019 upon evaluation today patient actually appears to be doing much better on the right leg with regard to the open wounds in fact there is nothing remaining at this point which is great news. Overall I am very pleased with where things stand. I do believe that the compression has helped he still has edema which is quite significant I still believe lymphedema pumps  would be beneficial for him. With regard to the left leg he still has again the  edema and this is nonetheless quite significant despite compression therapy but still I do believe that he is doing much better with the compression I do believe again lymphedema pumps would be of benefit in this regard as well. He has been trying to elevate and move around/exercise much as possible but nonetheless I still think that the compression which he does have his compression socks today as well as lymphedema pumps would greatly benefit him as far as preventing future ulcerations/blisters that would lead to further expensive wound care visits. 10/25/2019 on evaluation today patient appears to be doing excellent in regard to his legs bilaterally. In fact there is only one small area on the posterior aspect of his left leg that appears to be an issue at this time. Fortunately there is no signs of active infection which is great news. No fevers, chills, nausea, vomiting, or diarrhea. I do believe that for all intents purposes this is completely healed I think it would be appropriate for [place] to make a referral to the lymphedema clinic for him at this point. 11/02/2019 on evaluation today patient appears to be doing well with regard to his legs bilaterally. I do not see anything that remains open at this point he still has swelling but he does have new compression socks that he got as well. Fortunately there is no signs of active infection at this time which is great news. We did make a referral to lymphedema clinic he has not heard from them yet but I did recommend him giving them a call and we can give him the number for that today. 11/16/2019 upon evaluation today patient appears to be doing well with regard to his right leg. Unfortunately his left leg has started to weep and drain again he had a couple blister areas that are of concern at this point as well. Subsequently I do believe that the patient would benefit from likely reinitiation of compression wrapping of the left leg based on what I am  seeing currently. Fortunately there is no signs of active infection at this time. 11/23/2019 upon evaluation today patient actually appears to be making some good progress in regard to his lower extremities. Fortunately there is no evidence of active infection which is great news and overall I am extremely pleased with where things stand today. The patient may still have a couple areas that are leaking on the left leg but in general he has improved dramatically. Of note he did receive his lymphedema pumps this week and is supposed to have somebody coming out to show him how to use them and get them set up for him later this week. 11/29/2019 upon evaluation today patient appears to be doing very well in regard to his leg ulcers. In fact the left leg and the right leg both appear to be for the most part closed his right leg is a little bit more swollen and again we have not been wrapping this so that may be the reason. Nonetheless I do believe that the patient may benefit from wrapping both legs 1 more week just to make sure nothing reopens even though it seems to be doing quite well I am hopeful he will be ready for discharge at that point next week. 12/07/2019 upon evaluation today patient appears to be doing excellent in regard to his lower extremities. In fact it does appear today that everything is  completely closed he is healed and doing very well. He tells me that the lymphedema pump wrap will be coming out on Friday she was supposed to come today but was not feeling well therefore she is coming Friday to get him set up with his pumps. That will obviously be very beneficial for him. Readmission: 04/11/2020 upon evaluation today patient presents for reevaluation with regard to wound on his right leg which he tells me just recently reopened. He is a patient of mine from previous that has done quite well over the past several months. He is been using his lymphedema pumps as well as his compression  stockings. He tells me that that has kept things under control although they have never really been as small as they were when Armetta, Ehab G. ([id number]) he was here in the clinic being seen previous. Nonetheless he tells me that since November he is really had no significant issues just in the past week or so with the current wound. 04/18/2020 upon evaluation today patient appears to be doing well with regard to his right lower extremity in fact the wound that was open is completely closed there are no open wounds at this point which is great news. His legs also significantly smaller and just 1 week with a 4-layer compression wrap he actually decreased by around 15 cm in circumference which is significant. With that being said to be honest his leg looks to be doing great his left leg however is not doing so great. For that reason I think there were probably need to wrap him today both legs in order to keep swelling under control and get things under control with regard to his left leg. Subsequently I did discuss with the patient that also believe he needs to probably be seen at lymphedema clinic we made this referral last year but I think there might have been some staffing issues going on at that time with lymphedema clinic although not 100% sure either way I really think he would benefit from this and currently I think it is good to be a necessity because he cannot even get the wraps that we had given him previous around his leg on the left at all and I am sure he cannot get a compression sock on. He is trying to use his lymphedema pumps every day and he likes these but at the same time they are not really doing the job alone. He needs better compression. Electronic Signature(s) Signed: 04/18/2020 9:32:59 AM By: Lenda KelpStone III, Nautia Lem PA-C Entered By: Lenda KelpStone III, Jennene Downie on 04/18/2020 09:32:58 Jeffrey GillisSTEWART, Jeffrey G.  ([id number]) -------------------------------------------------------------------------------- Physical Exam Details Patient Name: Jeffrey GillisSTEWART, Jeffrey G. Date of Service: 04/18/2020 9:00 AM Medical Record Number: [id number] Patient Account Number: [id number] Date of Birth/Sex: 04/04/1975 (45 y.o. M) Treating RN: Rogers BlockerSanchez, Kenia Primary Care Provider: Kerby NoraBedsole, Amy Other Clinician: Lolita CramBurnette, Kyara Referring Provider: Kerby NoraBedsole, Amy Treating Provider/Extender: Allen DerryStone, Megan Hayduk Weeks in Treatment: 1 Constitutional Obese and well-hydrated in no acute distress. Respiratory normal breathing without difficulty. Psychiatric this patient is able to make decisions and demonstrates good insight into disease process. Alert and Oriented x 3. pleasant and cooperative. Notes Upon inspection patient's wounds actually appear to be completely healed which is great news. I am pleased in that regard. With that being said he has significant swelling of left lower extremity I think he does need referral to lymphedema clinic. We have made this referral before but unfortunately that never materialized. I think this may be a better time to  make that referral currently and especially since he has no wounds. Electronic Signature(s) Signed: 04/18/2020 7:33:03 PM By: Lenda Kelp PA-C Entered By: Lenda Kelp on 04/18/2020 19:33:03 Jeffrey Lin ([id number]) -------------------------------------------------------------------------------- Physician Orders Details Patient Name: Jeffrey Lin Date of Service: 04/18/2020 9:00 AM Medical Record Number: [id number] Patient Account Number: [id number] Date of Birth/Sex: 1975/02/01 (45 y.o. M) Treating RN: Rogers Blocker Primary Care Provider: Kerby Nora Other Clinician: Lolita Cram Referring Provider: Kerby Nora Treating Provider/Extender: Rowan Blase in Treatment: 1 Verbal / Phone Orders: No Diagnosis Coding ICD-10 Coding Code Description I89.0 Lymphedema,  not elsewhere classified L97.812 Non-pressure chronic ulcer of other part of right lower leg with fat layer exposed I10 Essential (primary) hypertension Follow-up Appointments o Return Appointment in 1 week. Bathing/ Shower/ Hygiene o May shower with wound dressing protected with water repellent cover or cast protector. Edema Control - Lymphedema / Segmental Compressive Device / Other Bilateral Lower Extremities o Optional: One layer of unna paste to top of compression wrap (to act as an anchor). o 4 Layer Compression System (Left, Right, Bilateral) Lymphedema. o Compression Pump: Use compression pump on left lower extremity for 60 minutes, twice daily. o Compression Pump: Use compression pump on right lower extremity for 60 minutes, twice daily. Services and Therapies o Lymphedema Clinic - OT to evaluate and treat bilateral lymphedema - (ICD10 I89.0 - Lymphedema, not elsewhere classified) Electronic Signature(s) Signed: 04/18/2020 2:16:03 PM By: Lenda Kelp PA-C Signed: 04/18/2020 5:07:41 PM By: Phillis Haggis, Dondra Prader RN Entered By: Phillis Haggis, Dondra Prader on 04/18/2020 09:30:04 Jeffrey Lin, Jeffrey Lin ([id number]) -------------------------------------------------------------------------------- Prescription 04/18/2020 Patient Name: Jeffrey Lin Provider: Allen Derry PA-C Date of Birth: 11-Dec-1975 NPI#: [id number] Sex: M DEA#: [id number] Phone #: [phone] License #: Patient Address: [facility] [address] [facility] [place], [place] [address] [address], [address] [place], [place] [address] [phone] Allergies Toradol; Lamotrigine Provider's Orders o Lymphedema Clinic - ICD10: I89.0 - OT to evaluate and treat bilateral lymphedema Hand Signature: Date(s): Electronic Signature(s) Signed: 04/18/2020 2:16:03 PM By: Lenda Kelp PA-C Signed: 04/18/2020 5:07:41 PM By: Phillis Haggis, Dondra Prader  RN Entered By: Phillis Haggis, Dondra Prader on 04/18/2020 09:30:05 Jeffrey Lin ([id number]) --------------------------------------------------------------------------------  Problem List Details Patient Name: Jeffrey Lin Date of Service: 04/18/2020 9:00 AM Medical Record Number: [id number] Patient Account Number: [id number] Date of Birth/Sex: 1975-06-30 (45 y.o. M) Treating RN: Rogers Blocker Primary Care Provider: Kerby Nora Other Clinician: Lolita Cram Referring Provider: Kerby Nora Treating Provider/Extender: Rowan Blase in Treatment: 1 Active Problems ICD-10 Encounter Code Description Active Date MDM Diagnosis I89.0 Lymphedema, not elsewhere classified 04/11/2020 No Yes L97.812 Non-pressure chronic ulcer of other part of right lower leg with fat layer 04/11/2020 No Yes exposed I10 Essential (primary) hypertension 04/11/2020 No Yes Inactive Problems Resolved Problems Electronic Signature(s) Signed: 04/18/2020 9:16:32 AM By: Lenda Kelp PA-C Entered By: Lenda Kelp on 04/18/2020 09:16:32 Jeffrey Lin, Jeffrey Lin ([id number]) -------------------------------------------------------------------------------- Progress Note Details Patient Name: Jeffrey Lin Date of Service: 04/18/2020 9:00 AM Medical Record Number: [id number] Patient Account Number: [id number] Date of Birth/Sex: 1975/09/08 (45 y.o. M) Treating RN: Rogers Blocker Primary Care Provider: Kerby Nora Other Clinician: Lolita Cram Referring Provider: Kerby Nora Treating Provider/Extender: Rowan Blase in Treatment: 1 Subjective Chief Complaint Information obtained from Patient Bilateral LE Lymphedema History of Present Illness (HPI) 09/20/2019 upon evaluation today patient presents for initial evaluation in our clinic concerning issues that he has been having with  wounds over the bilateral lower extremities. Both legs seem to be fairly equally affected at this point based on what I am  seeing. With that being said the patient also appears to be having some drainage which is somewhat blue/green in nature and does have me concerned about the possibility of infection. I explained this often can indicate a Pseudomonas infection despite the fact that the patient has been on antibiotics currently this could still be problematic. He currently does not have home health that is something that we did discussed the potential for looking into. I think that could be beneficial for him to be honest. The patient seems somewhat frustrated with the situation in general. He does have a history of lymphedema, hypertension, and he does ambulate and walk though not a lot and tells me that he really does not drive unless is absolutely forced for something he absolutely has to do otherwise he is essentially homebound at this point. He did have a believe his mother with him today. 09/28/2019 on evaluation today patient appears to be doing better in regard to his leg ulcers at this point. The antibiotic does seem to be helping and overall he tells me that his pain is less though he still is having discomfort. Fortunately there is no signs of active infection at this time. 10/04/2019 upon evaluation today patient appears to be doing well at this point in regard to his wounds. He in fact is improving dramatically from the standpoint of his infection which I think is under great control. I see no evidence of infection systemically at this point and overall I am extremely pleased I think he is getting close to resolution to be honest though he still has several scattered areas at this point. 10/12/2019 upon evaluation today patient actually seems to be making excellent progress in regard to his leg ulcers at this point. He just has a small area on the left posterior and a small area on the right anterior leg that are still open. Everything else has completely resolved and seems to have done excellent up to this  point. 10/19/2019 upon evaluation today patient actually appears to be doing much better on the right leg with regard to the open wounds in fact there is nothing remaining at this point which is great news. Overall I am very pleased with where things stand. I do believe that the compression has helped he still has edema which is quite significant I still believe lymphedema pumps would be beneficial for him. With regard to the left leg he still has again the edema and this is nonetheless quite significant despite compression therapy but still I do believe that he is doing much better with the compression I do believe again lymphedema pumps would be of benefit in this regard as well. He has been trying to elevate and move around/exercise much as possible but nonetheless I still think that the compression which he does have his compression socks today as well as lymphedema pumps would greatly benefit him as far as preventing future ulcerations/blisters that would lead to further expensive wound care visits. 10/25/2019 on evaluation today patient appears to be doing excellent in regard to his legs bilaterally. In fact there is only one small area on the posterior aspect of his left leg that appears to be an issue at this time. Fortunately there is no signs of active infection which is great news. No fevers, chills, nausea, vomiting, or diarrhea. I do believe that for all intents  purposes this is completely healed I think it would be appropriate for [place] to make a referral to the lymphedema clinic for him at this point. 11/02/2019 on evaluation today patient appears to be doing well with regard to his legs bilaterally. I do not see anything that remains open at this point he still has swelling but he does have new compression socks that he got as well. Fortunately there is no signs of active infection at this time which is great news. We did make a referral to lymphedema clinic he has not heard from them yet  but I did recommend him giving them a call and we can give him the number for that today. 11/16/2019 upon evaluation today patient appears to be doing well with regard to his right leg. Unfortunately his left leg has started to weep and drain again he had a couple blister areas that are of concern at this point as well. Subsequently I do believe that the patient would benefit from likely reinitiation of compression wrapping of the left leg based on what I am seeing currently. Fortunately there is no signs of active infection at this time. 11/23/2019 upon evaluation today patient actually appears to be making some good progress in regard to his lower extremities. Fortunately there is no evidence of active infection which is great news and overall I am extremely pleased with where things stand today. The patient may still have a couple areas that are leaking on the left leg but in general he has improved dramatically. Of note he did receive his lymphedema pumps this week and is supposed to have somebody coming out to show him how to use them and get them set up for him later this week. 11/29/2019 upon evaluation today patient appears to be doing very well in regard to his leg ulcers. In fact the left leg and the right leg both appear to be for the most part closed his right leg is a little bit more swollen and again we have not been wrapping this so that may be the reason. Nonetheless I do believe that the patient may benefit from wrapping both legs 1 more week just to make sure nothing reopens even though it seems to be doing quite well I am hopeful he will be ready for discharge at that point next week. 12/07/2019 upon evaluation today patient appears to be doing excellent in regard to his lower extremities. In fact it does appear today that everything is completely closed he is healed and doing very well. He tells me that the lymphedema pump wrap will be coming out on Friday she was supposed to come  today but was not feeling well therefore she is coming Friday to get him set up with his pumps. That will obviously be very beneficial for him. Readmission: Jeffrey Lin, Jeffrey Lin ([id number]) 04/11/2020 upon evaluation today patient presents for reevaluation with regard to wound on his right leg which he tells me just recently reopened. He is a patient of mine from previous that has done quite well over the past several months. He is been using his lymphedema pumps as well as his compression stockings. He tells me that that has kept things under control although they have never really been as small as they were when he was here in the clinic being seen previous. Nonetheless he tells me that since November he is really had no significant issues just in the past week or so with the current wound. 04/18/2020 upon evaluation today  patient appears to be doing well with regard to his right lower extremity in fact the wound that was open is completely closed there are no open wounds at this point which is great news. His legs also significantly smaller and just 1 week with a 4-layer compression wrap he actually decreased by around 15 cm in circumference which is significant. With that being said to be honest his leg looks to be doing great his left leg however is not doing so great. For that reason I think there were probably need to wrap him today both legs in order to keep swelling under control and get things under control with regard to his left leg. Subsequently I did discuss with the patient that also believe he needs to probably be seen at lymphedema clinic we made this referral last year but I think there might have been some staffing issues going on at that time with lymphedema clinic although not 100% sure either way I really think he would benefit from this and currently I think it is good to be a necessity because he cannot even get the wraps that we had given him previous around his leg on the left at  all and I am sure he cannot get a compression sock on. He is trying to use his lymphedema pumps every day and he likes these but at the same time they are not really doing the job alone. He needs better compression. Objective Constitutional Obese and well-hydrated in no acute distress. Vitals Time Taken: 9:00 AM, Height: 78 in, Weight: 280 lbs, BMI: 32.4, Temperature: 98.1 F, Pulse: 111 bpm, Respiratory Rate: 18 breaths/min, Blood Pressure: 125/90 mmHg. Respiratory normal breathing without difficulty. Psychiatric this patient is able to make decisions and demonstrates good insight into disease process. Alert and Oriented x 3. pleasant and cooperative. General Notes: Upon inspection patient's wounds actually appear to be completely healed which is great news. I am pleased in that regard. With that being said he has significant swelling of left lower extremity I think he does need referral to lymphedema clinic. We have made this referral before but unfortunately that never materialized. I think this may be a better time to make that referral currently and especially since he has no wounds. Integumentary (Hair, Skin) Wound #5 status is Healed - Epithelialized. Original cause of wound was Gradually Appeared. The date acquired was: 04/03/2020. The wound has been in treatment 1 weeks. The wound is located on the Right,Anterior Lower Leg. The wound measures 0cm length x 0cm width x 0cm depth; 0cm^2 area and 0cm^3 volume. There is no tunneling or undermining noted. There is a none present amount of drainage noted. There is no granulation within the wound bed. There is no necrotic tissue within the wound bed. Assessment Active Problems ICD-10 Lymphedema, not elsewhere classified Non-pressure chronic ulcer of other part of right lower leg with fat layer exposed Essential (primary) hypertension Procedures There was a Four Layer Compression Therapy Procedure by Rogers Blocker, RN. Post procedure  Diagnosis Wound #: Same as Pre-Procedure Jeffrey Lin, Jeffrey G. ([id number]) Notes: pt tolerating wraps well. Plan Follow-up Appointments: Return Appointment in 1 week. Bathing/ Shower/ Hygiene: May shower with wound dressing protected with water repellent cover or cast protector. Edema Control - Lymphedema / Segmental Compressive Device / Other: Optional: One layer of unna paste to top of compression wrap (to act as an anchor). 4 Layer Compression System (Left, Right, Bilateral) Lymphedema. Compression Pump: Use compression pump on left lower extremity for 60  minutes, twice daily. Compression Pump: Use compression pump on right lower extremity for 60 minutes, twice daily. Services and Therapies ordered were: Lymphedema Clinic - OT to evaluate and treat bilateral lymphedema 1. Would recommend currently that we going and wrap both legs with a 4-layer compression wrap to get edema under control the patient is in agreement with that plan. 2. Muscle can recommend that we have the patient continue to elevate his legs much as possible that seems to be doing excellent. 3. He also continue to use his lymphedema pumps he does seem to do this on a regular basis which is great news. We will see patient back for reevaluation in 1 week here in the clinic. If anything worsens or changes patient will contact our office for additional recommendations. Electronic Signature(s) Signed: 04/18/2020 7:33:26 PM By: Lenda Kelp PA-C Entered By: Lenda Kelp on 04/18/2020 19:33:26 Jeffrey Lin, Jeffrey Lin ([id number]) -------------------------------------------------------------------------------- SuperBill Details Patient Name: Jeffrey Lin Date of Service: 04/18/2020 Medical Record Number: [id number] Patient Account Number: [id number] Date of Birth/Sex: 06-26-1975 (45 y.o. M) Treating RN: Rogers Blocker Primary Care Provider: Kerby Nora Other Clinician: Lolita Cram Referring Provider: Kerby Nora Treating Provider/Extender: Rowan Blase in Treatment: 1 Diagnosis Coding ICD-10 Codes Code Description I89.0 Lymphedema, not elsewhere classified L97.812 Non-pressure chronic ulcer of other part of right lower leg with fat layer exposed I10 Essential (primary) hypertension Facility Procedures CPT4: Description Modifier Quantity Code 13086578 [id number] BILATERAL: Application of multi-layer venous compression system; leg (below knee), including 1 ankle and foot. Physician Procedures CPT4 Code: 4696295 Description: 99213 - WC PHYS LEVEL 3 - EST PT Modifier: Quantity: 1 CPT4 Code: Description: ICD-10 Diagnosis Description I89.0 Lymphedema, not elsewhere classified L97.812 Non-pressure chronic ulcer of other part of right lower leg with fat la I10 Essential (primary) hypertension Modifier: yer exposed Quantity: Electronic Signature(s) Signed: 04/18/2020 7:33:40 PM By: Lenda Kelp PA-C Previous Signature: 04/18/2020 2:16:03 PM Version By: Lenda Kelp PA-C Previous Signature: 04/18/2020 5:07:41 PM Version By: Phillis Haggis, Dondra Prader RN Entered By: Lenda Kelp on 04/18/2020 19:33:39

## 2020-04-25 ENCOUNTER — Encounter: Payer: Medicare Other | Attending: Physician Assistant

## 2020-04-25 ENCOUNTER — Other Ambulatory Visit: Payer: Self-pay

## 2020-04-25 DIAGNOSIS — I10 Essential (primary) hypertension: Secondary | ICD-10-CM | POA: Diagnosis not present

## 2020-04-25 DIAGNOSIS — L97812 Non-pressure chronic ulcer of other part of right lower leg with fat layer exposed: Secondary | ICD-10-CM | POA: Insufficient documentation

## 2020-04-25 DIAGNOSIS — I890 Lymphedema, not elsewhere classified: Secondary | ICD-10-CM | POA: Diagnosis present

## 2020-04-28 NOTE — Progress Notes (Signed)
[name], [name] ([id number]) Visit Report for 04/18/2020 Arrival Information Details Patient Name: Jeffrey Lin, Jeffrey Lin Date of Service: 04/18/2020 9:00 AM Medical Record Number: [id number] Patient Account Number: [id number] Date of Birth/Sex: February 11, 1975 (45 y.o. M) Treating RN: Rogers Blocker Primary Care Landrey Mahurin: Kerby Nora Other Clinician: Lolita Cram Referring Batool Majid: Kerby Nora Treating Trestan Vahle/Extender: Rowan Blase in Treatment: 1 Visit Information History Since Last Visit Added or deleted any medications: No Patient Arrived: Ambulatory Had a fall or experienced change in No Arrival Time: 08:58 activities of daily living that may affect Accompanied By: self risk of falls: Transfer Assistance: None Hospitalized since last visit: No Patient Identification Verified: Yes Pain Present Now: Yes Secondary Verification Process Completed: Yes Patient Has Alerts: Yes Patient Alerts: ABI left .88 09/20/2019 ABI right .81 09/20/2019 Electronic Signature(s) Signed: 04/19/2020 4:51:14 PM By: Lolita Cram Entered By: Lolita Cram on 04/18/2020 09:00:50 Jeffrey Lin ([id number]) -------------------------------------------------------------------------------- Clinic Level of Care Assessment Details Patient Name: Jeffrey Lin Date of Service: 04/18/2020 9:00 AM Medical Record Number: [id number] Patient Account Number: [id number] Date of Birth/Sex: 1975/08/05 (46 y.o. M) Treating RN: Rogers Blocker Primary Care Vennesa Bastedo: Kerby Nora Other Clinician: Lolita Cram Referring Raesean Bartoletti: Kerby Nora Treating Novalie Leamy/Extender: Rowan Blase in Treatment: 1 Clinic Level of Care Assessment Items TOOL 1 Quantity Score []  - Use when EandM and Procedure is performed on INITIAL visit 0 ASSESSMENTS - Nursing Assessment / Reassessment []  - General Physical Exam (combine w/ comprehensive assessment (listed just below) when performed on new 0 pt. evals) []  -  0 Comprehensive Assessment (HX, ROS, Risk Assessments, Wounds Hx, etc.) ASSESSMENTS - Wound and Skin Assessment / Reassessment []  - Dermatologic / Skin Assessment (not related to wound area) 0 ASSESSMENTS - Ostomy and/or Continence Assessment and Care []  - Incontinence Assessment and Management 0 []  - 0 Ostomy Care Assessment and Management (repouching, etc.) PROCESS - Coordination of Care []  - Simple Patient / Family Education for ongoing care 0 []  - 0 Complex (extensive) Patient / Family Education for ongoing care []  - 0 Staff obtains , Records, Test Results / Process Orders []  - 0 Staff telephones HHA, Nursing Homes / Clarify orders / etc []  - 0 Routine Transfer to another Facility (non-emergent condition) []  - 0 Routine Hospital Admission (non-emergent condition) []  - 0 New Admissions / / Ordering NPWT, Apligraf, etc. []  - 0 Emergency Hospital Admission (emergent condition) PROCESS - Special Needs []  - Pediatric / Minor Patient Management 0 []  - 0 Isolation Patient Management []  - 0 Hearing / Language / Visual special needs []  - 0 Assessment of Community assistance (transportation, D/C planning, etc.) []  - 0 Additional assistance / Altered mentation []  - 0 Support Surface(s) Assessment (bed, cushion, seat, etc.) INTERVENTIONS - Miscellaneous []  - External ear exam 0 []  - 0 Patient Transfer (multiple staff / / Similar devices) []  - 0 Simple Staple / Suture removal (25 or less) []  - 0 Complex Staple / Suture removal (26 or more) []  - 0 Hypo/Hyperglycemic Management (do not check if billed separately) []  - 0 Ankle / Brachial Index (ABI) - do not check if billed separately Has the patient been seen at the hospital within the last three years: Yes Total Score: 0 Level Of Care: ____ ( ) Electronic Signature(s) Signed: 04/18/2020 5:07:41 PM By: , RN Entered By: ,  Chiropractor on 04/18/2020 09:22:39 ( ) -------------------------------------------------------------------------------- Compression Therapy Details Patient Name: Date of Service:  04/18/2020 9:00 AM Medical Record Number: [id number] Patient Account Number: [id number] Date of Birth/Sex: Sep 24, 1975 (45 y.o. M) Treating RN: Rogers Blocker Primary Care Jobany Montellano: Kerby Nora Other Clinician: Lolita Cram Referring Orlean Holtrop: Kerby Nora Treating Coulton Schlink/Extender: Allen Derry Weeks in Treatment: 1 Compression Therapy Performed for Wound Assessment: NonWound Condition Lymphedema - Bilateral Leg Performed By: Clinician Rogers Blocker, RN Compression Type: Four Layer Post Procedure Diagnosis Same as Pre-procedure Notes pt tolerating wraps well Electronic Signature(s) Signed: 04/18/2020 5:07:41 PM By: Phillis Haggis, Dondra Prader RN Entered By: Phillis Haggis, Dondra Prader on 04/18/2020 09:21:07 Jeffrey Lin, Jeffrey Lin ([id number]) -------------------------------------------------------------------------------- Encounter Discharge Information Details Patient Name: Jeffrey Lin Date of Service: 04/18/2020 9:00 AM Medical Record Number: [id number] Patient Account Number: [id number] Date of Birth/Sex: 29-Jun-1975 (45 y.o. M) Treating RN: Yevonne Pax Primary Care Attallah Ontko: Kerby Nora Other Clinician: Lolita Cram Referring Shawnte Winton: Kerby Nora Treating Valda Christenson/Extender: Rowan Blase in Treatment: 1 Encounter Discharge Information Items Discharge Condition: Stable Ambulatory Status: Ambulatory Discharge Destination: Home Transportation: Private Auto Accompanied By: self Schedule Follow-up Appointment: Yes Clinical Summary of Care: Patient Declined Electronic Signature(s) Signed: 04/28/2020 8:11:40 AM By: Yevonne Pax RN Entered By: Yevonne Pax on 04/18/2020 09:37:40 Jeffrey Lin  ([id number]) -------------------------------------------------------------------------------- Lower Extremity Assessment Details Patient Name: Jeffrey Lin Date of Service: 04/18/2020 9:00 AM Medical Record Number: [id number] Patient Account Number: [id number] Date of Birth/Sex: 09/10/1975 (45 y.o. M) Treating RN: Rogers Blocker Primary Care Gaines Cartmell: Kerby Nora Other Clinician: Lolita Cram Referring Briggette Najarian: Kerby Nora Treating Deshanae Lindo/Extender: Allen Derry Weeks in Treatment: 1 Edema Assessment Assessed: [Left: No] [Right: Yes] Edema: [Left: Ye] [Right: s] Calf Left: Right: Point of Measurement: 46 cm From Medial Instep 38.5 cm Ankle Left: Right: Point of Measurement: 12 cm From Medial Instep 27 cm Vascular Assessment Pulses: Dorsalis Pedis Palpable: [Right:Yes] Electronic Signature(s) Signed: 04/18/2020 5:07:41 PM By: Phillis Haggis, Dondra Prader RN Signed: 04/19/2020 4:51:14 PM By: Lolita Cram Entered By: Lolita Cram on 04/18/2020 09:14:33 Jeffrey Lin, Jeffrey Lin ([id number]) -------------------------------------------------------------------------------- Multi Wound Chart Details Patient Name: Jeffrey Lin Date of Service: 04/18/2020 9:00 AM Medical Record Number: [id number] Patient Account Number: [id number] Date of Birth/Sex: 1975-04-13 (45 y.o. M) Treating RN: Rogers Blocker Primary Care Antinette Keough: Kerby Nora Other Clinician: Lolita Cram Referring Latoshia Monrroy: Kerby Nora Treating Steele Ledonne/Extender: Rowan Blase in Treatment: 1 Vital Signs Height(in): 78 Pulse(bpm): 111 Weight(lbs): 280 Blood Pressure(mmHg): 125/90 Body Mass Index(BMI): 32 Temperature(F): 98.1 Respiratory Rate(breaths/min): 18 Photos: [N/A:N/A] Wound Location: Right, Anterior Lower Leg N/A N/A Wounding Event: Gradually Appeared N/A N/A Primary Etiology: Lymphedema N/A N/A Comorbid History: Hypertension N/A N/A Date Acquired: 04/03/2020 N/A N/A Weeks of Treatment: 1 N/A  N/A Wound Status: Healed - Epithelialized N/A N/A Measurements L x W x D (cm) 0x0x0 N/A N/A Area (cm) : 0 N/A N/A Volume (cm) : 0 N/A N/A % Reduction in Area: 100.00% N/A N/A % Reduction in Volume: 100.00% N/A N/A Classification: Full Thickness Without Exposed N/A N/A Support Structures Exudate Amount: None Present N/A N/A Granulation Amount: None Present (0%) N/A N/A Necrotic Amount: None Present (0%) N/A N/A Exposed Structures: Fascia: No N/A N/A Fat Layer (Subcutaneous Tissue): No Tendon: No Muscle: No Joint: No Bone: No Epithelialization: Large (67-100%) N/A N/A Treatment Notes Electronic Signature(s) Signed: 04/18/2020 5:07:41 PM By: Phillis Haggis, Dondra Prader RN Entered By: Phillis Haggis, Dondra Prader on 04/18/2020 09:20:43 Jeffrey Lin ([id number]) -------------------------------------------------------------------------------- Multi-Disciplinary Care Plan Details Patient Name: Jeffrey Lin Date of Service: 04/18/2020 9:00 AM Medical Record Number: [id number] Patient Account Number: [id number] Date of Birth/Sex: [birth year] (44  y.o. M) Treating RN: Rogers Blocker Primary Care Shabnam Ladd: Kerby Nora Other Clinician: Lolita Cram Referring Thaddius Manes: Kerby Nora Treating Herminia Warren/Extender: Allen Derry Weeks in Treatment: 1 Active Inactive Electronic Signature(s) Signed: 04/18/2020 5:07:41 PM By: Phillis Haggis, Dondra Prader RN Entered By: Phillis Haggis, Dondra Prader on 04/18/2020 09:20:35 Jeffrey Lin, Jeffrey Lin ([id number]) -------------------------------------------------------------------------------- Pain Assessment Details Patient Name: Jeffrey Lin Date of Service: 04/18/2020 9:00 AM Medical Record Number: [id number] Patient Account Number: [id number] Date of Birth/Sex: September 14, 1975 (45 y.o. M) Treating RN: Rogers Blocker Primary Care Alwyn Cordner: Kerby Nora Other Clinician: Lolita Cram Referring Leith Szafranski: Kerby Nora Treating Madeliene Tejera/Extender: Rowan Blase in  Treatment: 1 Active Problems Location of Pain Severity and Description of Pain Patient Has Paino Yes Site Locations Rate the pain. Current Pain Level: 4 Pain Management and Medication Current Pain Management: Electronic Signature(s) Signed: 04/18/2020 5:07:41 PM By: Phillis Haggis, Dondra Prader RN Signed: 04/19/2020 4:51:14 PM By: Lolita Cram Entered By: Lolita Cram on 04/18/2020 09:11:36 Jeffrey Lin ([id number]) -------------------------------------------------------------------------------- Patient/Caregiver Education Details Patient Name: Jeffrey Lin Date of Service: 04/18/2020 9:00 AM Medical Record Number: [id number] Patient Account Number: [id number] Date of Birth/Gender: August 07, 1975 (45 y.o. M) Treating RN: Rogers Blocker Primary Care Physician: Kerby Nora Other Clinician: Lolita Cram Referring Physician: Kerby Nora Treating Physician/Extender: Rowan Blase in Treatment: 1 Education Assessment Education Provided To: Patient Education Topics Provided Notes Skin integrity education, compression sock education Electronic Signature(s) Signed: 04/18/2020 5:07:41 PM By: Phillis Haggis, Dondra Prader RN Entered By: Phillis Haggis, Dondra Prader on 04/18/2020 09:24:01 Jeffrey Lin, Jeffrey Lin ([id number]) -------------------------------------------------------------------------------- Wound Assessment Details Patient Name: Jeffrey Lin Date of Service: 04/18/2020 9:00 AM Medical Record Number: [id number] Patient Account Number: [id number] Date of Birth/Sex: 1975-06-09 (45 y.o. M) Treating RN: Rogers Blocker Primary Care Kelin Borum: Kerby Nora Other Clinician: Lolita Cram Referring Soyla Bainter: Kerby Nora Treating Remonia Otte/Extender: Allen Derry Weeks in Treatment: 1 Wound Status Wound Number: 5 Primary Etiology: Lymphedema Wound Location: Right, Anterior Lower Leg Wound Status: Healed - Epithelialized Wounding Event: Gradually Appeared Comorbid History:  Hypertension Date Acquired: 04/03/2020 Weeks Of Treatment: 1 Clustered Wound: No Photos Wound Measurements Length: (cm) 0 Width: (cm) 0 Depth: (cm) 0 Area: (cm) 0 Volume: (cm) 0 % Reduction in Area: 100% % Reduction in Volume: 100% Epithelialization: Large (67-100%) Tunneling: No Undermining: No Wound Description Classification: Full Thickness Without Exposed Support Structure Exudate Amount: None Present s Foul Odor After Cleansing: No Slough/Fibrino No Wound Bed Granulation Amount: None Present (0%) Exposed Structure Necrotic Amount: None Present (0%) Fascia Exposed: No Fat Layer (Subcutaneous Tissue) Exposed: No Tendon Exposed: No Muscle Exposed: No Joint Exposed: No Bone Exposed: No Electronic Signature(s) Signed: 04/18/2020 5:07:41 PM By: Phillis Haggis, Dondra Prader RN Entered By: Phillis Haggis, Dondra Prader on 04/18/2020 09:19:51 Jeffrey Lin ([id number]) -------------------------------------------------------------------------------- Vitals Details Patient Name: Jeffrey Lin Date of Service: 04/18/2020 9:00 AM Medical Record Number: [id number] Patient Account Number: [id number] Date of Birth/Sex: [birth year] (45 y.o. M) Treating RN: Rogers Blocker Primary Care Demaree Liberto: Kerby Nora Other Clinician: Lolita Cram Referring Cristyn Crossno: Kerby Nora Treating Amiria Orrison/Extender: Rowan Blase in Treatment: 1 Vital Signs Time Taken: 09:00 Temperature (F): 98.1 Height (in): 78 Pulse (bpm): 111 Weight (lbs): 280 Respiratory Rate (breaths/min): 18 Body Mass Index (BMI): 32.4 Blood Pressure (mmHg): 125/90 Reference Range: 80 - 120 mg / dl Electronic Signature(s) Signed: 04/19/2020 4:51:14 PM By: Lolita Cram Entered By: Lolita Cram on 04/18/2020 09:11:15

## 2020-04-28 NOTE — Progress Notes (Signed)
Jeffrey Lin, Jeffrey Lin ([id number]) Visit Report for 04/25/2020 Arrival Information Details Patient Name: Jeffrey Lin, Jeffrey Lin Date of Service: 04/25/2020 8:45 AM Medical Record Number: [id number] Patient Account Number: [id number] Date of Birth/Sex: 1975/06/23 (45 y.o. M) Treating RN: Yevonne Pax Primary Care Tryton Bodi: Kerby Nora Other Clinician: Lolita Cram Referring Danely Bayliss: Kerby Nora Treating Binh Doten/Extender: Rowan Blase in Treatment: 2 Visit Information History Since Last Visit Added or deleted any medications: No Patient Arrived: Ambulatory Had a fall or experienced change in No Arrival Time: 08:50 activities of daily living that may affect Accompanied By: self risk of falls: Transfer Assistance: None Hospitalized since last visit: No Patient Has Alerts: Yes Pain Present Now: No Patient Alerts: ABI left .88 09/20/2019 ABI right .81 09/20/2019 Electronic Signature(s) Signed: 04/28/2020 8:10:37 AM By: Yevonne Pax RN Entered By: Yevonne Pax on 04/25/2020 08:57:32 Croslin, Deeann Saint ([id number]) -------------------------------------------------------------------------------- Clinic Level of Care Assessment Details Patient Name: Jeffrey Lin Date of Service: 04/25/2020 8:45 AM Medical Record Number: [id number] Patient Account Number: [id number] Date of Birth/Sex: 1975/09/15 (45 y.o. M) Treating RN: Yevonne Pax Primary Care Eknoor Novack: Kerby Nora Other Clinician: Lolita Cram Referring Armoni Kludt: Kerby Nora Treating Tranice Laduke/Extender: Rowan Blase in Treatment: 2 Clinic Level of Care Assessment Items TOOL 1 Quantity Score []  - Use when EandM and Procedure is performed on INITIAL visit 0 ASSESSMENTS - Nursing Assessment / Reassessment []  - General Physical Exam (combine w/ comprehensive assessment (listed just below) when performed on new 0 pt. evals) []  - 0 Comprehensive Assessment (HX, ROS, Risk Assessments, Wounds Hx, etc.) ASSESSMENTS - Wound and Skin  Assessment / Reassessment []  - Dermatologic / Skin Assessment (not related to wound area) 0 ASSESSMENTS - Ostomy and/or Continence Assessment and Care []  - Incontinence Assessment and Management 0 []  - 0 Ostomy Care Assessment and Management (repouching, etc.) PROCESS - Coordination of Care []  - Simple Patient / Family Education for ongoing care 0 []  - 0 Complex (extensive) Patient / Family Education for ongoing care []  - 0 Staff obtains , Records, Test Results / Process Orders []  - 0 Staff telephones HHA, Nursing Homes / Clarify orders / etc []  - 0 Routine Transfer to another Facility (non-emergent condition) []  - 0 Routine Hospital Admission (non-emergent condition) []  - 0 New Admissions / / Ordering NPWT, Apligraf, etc. []  - 0 Emergency Hospital Admission (emergent condition) PROCESS - Special Needs []  - Pediatric / Minor Patient Management 0 []  - 0 Isolation Patient Management []  - 0 Hearing / Language / Visual special needs []  - 0 Assessment of Community assistance (transportation, D/C planning, etc.) []  - 0 Additional assistance / Altered mentation []  - 0 Support Surface(s) Assessment (bed, cushion, seat, etc.) INTERVENTIONS - Miscellaneous []  - External ear exam 0 []  - 0 Patient Transfer (multiple staff / / Similar devices) []  - 0 Simple Staple / Suture removal (25 or less) []  - 0 Complex Staple / Suture removal (26 or more) []  - 0 Hypo/Hyperglycemic Management (do not check if billed separately) []  - 0 Ankle / Brachial Index (ABI) - do not check if billed separately Has the patient been seen at the hospital within the last three years: Yes Total Score: 0 Level Of Care: ____ ( ) Electronic Signature(s) Signed: 04/28/2020 8:10:37 AM By: RN Entered By: on 04/25/2020 09:08:10 Chiropractor  ( ) -------------------------------------------------------------------------------- Compression Therapy Details Patient Name: Date of Service: 04/25/2020 8:45 AM Medical Record Number: Patient Account Number:  [id number] Date of Birth/Sex: Aug 22, 1975 (45 y.o. M) Treating RN: Yevonne Pax Primary Care Danelly Hassinger: Kerby Nora Other Clinician: Lolita Cram Referring Tieasha Larsen: Kerby Nora Treating Zakaree Mcclenahan/Extender: Rowan Blase in Treatment: 2 Compression Therapy Performed for Wound Assessment: NonWound Condition Lymphedema - Right Ankle Performed By: Clinician Yevonne Pax, RN Compression Type: Four Layer Electronic Signature(s) Signed: 04/28/2020 8:10:37 AM By: Yevonne Pax RN Entered By: Yevonne Pax on 04/25/2020 09:07:10 Jeffrey Lin ([id number]) -------------------------------------------------------------------------------- Encounter Discharge Information Details Patient Name: Jeffrey Lin Date of Service: 04/25/2020 8:45 AM Medical Record Number: [id number] Patient Account Number: [id number] Date of Birth/Sex: 1975-05-16 (45 y.o. M) Treating RN: Yevonne Pax Primary Care Marci Polito: Kerby Nora Other Clinician: Lolita Cram Referring Trace Cederberg: Kerby Nora Treating Tirsa Gail/Extender: Rowan Blase in Treatment: 2 Encounter Discharge Information Items Discharge Condition: Stable Ambulatory Status: Ambulatory Discharge Destination: Home Transportation: Private Auto Accompanied By: self Schedule Follow-up Appointment: Yes Clinical Summary of Care: Patient Declined Electronic Signature(s) Signed: 04/28/2020 8:10:37 AM By: Yevonne Pax RN Entered By: Yevonne Pax on 04/25/2020 09:08:04

## 2020-05-02 ENCOUNTER — Other Ambulatory Visit: Payer: Self-pay

## 2020-05-02 ENCOUNTER — Encounter: Payer: Medicare Other | Admitting: Physician Assistant

## 2020-05-02 DIAGNOSIS — I890 Lymphedema, not elsewhere classified: Secondary | ICD-10-CM | POA: Diagnosis not present

## 2020-05-02 NOTE — Progress Notes (Addendum)
LOVEL, SUAZO ([id number]) Visit Report for 05/02/2020 Chief Complaint Document Details Patient Name: Jeffrey Lin, Jeffrey Lin Date of Service: 05/02/2020 9:00 AM Medical Record Number: [id number] Patient Account Number: [id number] Date of Birth/Sex: 06-25-1975 (45 y.o. M) Treating RN: Rogers Blocker Primary Care Provider: Kerby Nora Other Clinician: Lolita Cram Referring Provider: Kerby Nora Treating Provider/Extender: Rowan Blase in Treatment: 3 Information Obtained from: Patient Chief Complaint Bilateral LE Lymphedema Electronic Signature(Jeffrey Lin) Signed: 05/02/2020 8:54:33 AM By: Lenda Kelp PA-C Entered By: Lenda Kelp on 05/02/2020 08:54:32 Spirito, Jeffrey Lin ([id number]) -------------------------------------------------------------------------------- HPI Details Patient Name: Jeffrey Lin Date of Service: 05/02/2020 9:00 AM Medical Record Number: [id number] Patient Account Number: [id number] Date of Birth/Sex: [birth year] (45 y.o. M) Treating RN: Rogers Blocker Primary Care Provider: Kerby Nora Other Clinician: Lolita Cram Referring Provider: Kerby Nora Treating Provider/Extender: Rowan Blase in Treatment: 3 History of Present Illness HPI Description: 09/20/2019 upon evaluation today patient presents for initial evaluation in our clinic concerning issues that he has been having with wounds over the bilateral lower extremities. Both legs seem to be fairly equally affected at this point based on what I am seeing. With that being said the patient also appears to be having some drainage which is somewhat blue/green in nature and does have me concerned about the possibility of infection. I explained this often can indicate a Pseudomonas infection despite the fact that the patient has been on antibiotics currently this could still be problematic. He currently does not have home health that is something that we did discussed the potential for looking into. I  think that could be beneficial for him to be honest. The patient seems somewhat frustrated with the situation in general. He does have a history of lymphedema, hypertension, and he does ambulate and walk though not a lot and tells me that he really does not drive unless is absolutely forced for something he absolutely has to do otherwise he is essentially homebound at this point. He did have a believe his mother with him today. 09/28/2019 on evaluation today patient appears to be doing better in regard to his leg ulcers at this point. The antibiotic does seem to be helping and overall he tells me that his pain is less though he still is having discomfort. Fortunately there is no signs of active infection at this time. 10/04/2019 upon evaluation today patient appears to be doing well at this point in regard to his wounds. He in fact is improving dramatically from the standpoint of his infection which I think is under great control. I see no evidence of infection systemically at this point and overall I am extremely pleased I think he is getting close to resolution to be honest though he still has several scattered areas at this point. 10/12/2019 upon evaluation today patient actually seems to be making excellent progress in regard to his leg ulcers at this point. He just has a small area on the left posterior and a small area on the right anterior leg that are still open. Everything else has completely resolved and seems to have done excellent up to this point. 10/19/2019 upon evaluation today patient actually appears to be doing much better on the right leg with regard to the open wounds in fact there is nothing remaining at this point which is great news. Overall I am very pleased with where things stand. I do believe that the compression has helped he still has edema which is quite significant I still believe lymphedema pumps  would be beneficial for him. With regard to the left leg he still has again the  edema and this is nonetheless quite significant despite compression therapy but still I do believe that he is doing much better with the compression I do believe again lymphedema pumps would be of benefit in this regard as well. He has been trying to elevate and move around/exercise much as possible but nonetheless I still think that the compression which he does have his compression socks today as well as lymphedema pumps would greatly benefit him as far as preventing future ulcerations/blisters that would lead to further expensive wound care visits. 10/25/2019 on evaluation today patient appears to be doing excellent in regard to his legs bilaterally. In fact there is only one small area on the posterior aspect of his left leg that appears to be an issue at this time. Fortunately there is no signs of active infection which is great news. No fevers, chills, nausea, vomiting, or diarrhea. I do believe that for all intents purposes this is completely healed I think it would be appropriate for [place] to make a referral to the lymphedema clinic for him at this point. 11/02/2019 on evaluation today patient appears to be doing well with regard to his legs bilaterally. I do not see anything that remains open at this point he still has swelling but he does have new compression socks that he got as well. Fortunately there is no signs of active infection at this time which is great news. We did make a referral to lymphedema clinic he has not heard from them yet but I did recommend him giving them a call and we can give him the number for that today. 11/16/2019 upon evaluation today patient appears to be doing well with regard to his right leg. Unfortunately his left leg has started to weep and drain again he had a couple blister areas that are of concern at this point as well. Subsequently I do believe that the patient would benefit from likely reinitiation of compression wrapping of the left leg based on what I am  seeing currently. Fortunately there is no signs of active infection at this time. 11/23/2019 upon evaluation today patient actually appears to be making some good progress in regard to his lower extremities. Fortunately there is no evidence of active infection which is great news and overall I am extremely pleased with where things stand today. The patient may still have a couple areas that are leaking on the left leg but in general he has improved dramatically. Of note he did receive his lymphedema pumps this week and is supposed to have somebody coming out to show him how to use them and get them set up for him later this week. 11/29/2019 upon evaluation today patient appears to be doing very well in regard to his leg ulcers. In fact the left leg and the right leg both appear to be for the most part closed his right leg is a little bit more swollen and again we have not been wrapping this so that may be the reason. Nonetheless I do believe that the patient may benefit from wrapping both legs 1 more week just to make sure nothing reopens even though it seems to be doing quite well I am hopeful he will be ready for discharge at that point next week. 12/07/2019 upon evaluation today patient appears to be doing excellent in regard to his lower extremities. In fact it does appear today that everything is  completely closed he is healed and doing very well. He tells me that the lymphedema pump wrap will be coming out on Friday she was supposed to come today but was not feeling well therefore she is coming Friday to get him set up with his pumps. That will obviously be very beneficial for him. Readmission: 04/11/2020 upon evaluation today patient presents for reevaluation with regard to wound on his right leg which he tells me just recently reopened. He is a patient of mine from previous that has done quite well over the past several months. He is been using his lymphedema pumps as well as his compression  stockings. He tells me that that has kept things under control although they have never really been as small as they were when Erven, Jams G. ([id number]) he was here in the clinic being seen previous. Nonetheless he tells me that since November he is really had no significant issues just in the past week or so with the current wound. 04/18/2020 upon evaluation today patient appears to be doing well with regard to his right lower extremity in fact the wound that was open is completely closed there are no open wounds at this point which is great news. His legs also significantly smaller and just 1 week with a 4-layer compression wrap he actually decreased by around 15 cm in circumference which is significant. With that being said to be honest his leg looks to be doing great his left leg however is not doing so great. For that reason I think there were probably need to wrap him today both legs in order to keep swelling under control and get things under control with regard to his left leg. Subsequently I did discuss with the patient that also believe he needs to probably be seen at lymphedema clinic we made this referral last year but I think there might have been some staffing issues going on at that time with lymphedema clinic although not 100% sure either way I really think he would benefit from this and currently I think it is good to be a necessity because he cannot even get the wraps that we had given him previous around his leg on the left at all and I am sure he cannot get a compression sock on. He is trying to use his lymphedema pumps every day and he likes these but at the same time they are not really doing the job alone. He needs better compression. 05/02/20 upon evaluation today patient actually appears to be completely healed and is doing excellent at this time. There does not appear to be any signs of active infection which is great news and overall very pleased in that regard.  Unfortunately getting him into the lymphedema clinic is taking much longer than what we would hope for. Electronic Signature(Jeffrey Lin) Signed: 05/02/2020 6:11:55 PM By: Lenda Kelp PA-C Entered By: Lenda Kelp on 05/02/2020 18:11:55 Jeffrey Lin, Jeffrey Lin ([id number]) -------------------------------------------------------------------------------- Physical Exam Details Patient Name: Jeffrey Lin Date of Service: 05/02/2020 9:00 AM Medical Record Number: [id number] Patient Account Number: [id number] Date of Birth/Sex: October 03, 1975 (45 y.o. M) Treating RN: Rogers Blocker Primary Care Provider: Kerby Nora Other Clinician: Lolita Cram Referring Provider: Kerby Nora Treating Provider/Extender: Rowan Blase in Treatment: 3 Constitutional Well-nourished and well-hydrated in no acute distress. Respiratory normal breathing without difficulty. Psychiatric this patient is able to make decisions and demonstrates good insight into disease process. Alert and Oriented x 3. pleasant and cooperative. Notes Upon inspection patient'Jeffrey Lin wound  bed actually showed signs of good granulation epithelization at this point. There does not appear to be any signs of active infection which is great news and overall I am extremely pleased with where things stand at this point. No fevers, chills, nausea, vomiting, or diarrhea. Electronic Signature(Jeffrey Lin) Signed: 05/02/2020 6:12:17 PM By: Lenda KelpStone III, Devyne Hauger PA-C Entered By: Lenda KelpStone III, Nataliah Hatlestad on 05/02/2020 18:12:16 Jeffrey Lin, Jeffrey G. ([id number]) -------------------------------------------------------------------------------- Physician Orders Details Patient Name: Jeffrey Lin, Jeffrey G. Date of Service: 05/02/2020 9:00 AM Medical Record Number: [id number] Patient Account Number: [id number] Date of Birth/Sex: 04/03/1975 (45 y.o. M) Treating RN: Rogers BlockerSanchez, Kenia Primary Care Provider: Kerby NoraBedsole, Amy Other Clinician: Lolita CramBurnette, Kyara Referring Provider: Kerby NoraBedsole, Amy Treating  Provider/Extender: Rowan BlaseStone, Win Guajardo Weeks in Treatment: 3 Verbal / Phone Orders: No Diagnosis Coding ICD-10 Coding Code Description I89.0 Lymphedema, not elsewhere classified L97.812 Non-pressure chronic ulcer of other part of right lower leg with fat layer exposed I10 Essential (primary) [facility] Treatment Complete o Wear compression garments daily. Put garments on first thing when you wake up and remove them before bed. Electronic Signature(Jeffrey Lin) Signed: 05/02/2020 4:59:45 PM By: Phillis HaggisSanchez Pereyda, Dondra PraderKenia RN Signed: 05/02/2020 6:19:37 PM By: Lenda KelpStone III, Katianna Mcclenney PA-C Entered By: Phillis HaggisSanchez Pereyda, Dondra PraderKenia on 05/02/2020 09:42:38 Jeffrey Lin, Jeffrey G. ([id number]) -------------------------------------------------------------------------------- Problem List Details Patient Name: Jeffrey Lin, Jeffrey G. Date of Service: 05/02/2020 9:00 AM Medical Record Number: [id number] Patient Account Number: [id number] Date of Birth/Sex: 05/07/1975 (45 y.o. M) Treating RN: Rogers BlockerSanchez, Kenia Primary Care Provider: Kerby NoraBedsole, Amy Other Clinician: Lolita CramBurnette, Kyara Referring Provider: Kerby NoraBedsole, Amy Treating Provider/Extender: Rowan BlaseStone, Telly Jawad Weeks in Treatment: 3 Active Problems ICD-10 Encounter Code Description Active Date MDM Diagnosis I89.0 Lymphedema, not elsewhere classified 04/11/2020 No Yes L97.812 Non-pressure chronic ulcer of other part of right lower leg with fat layer 04/11/2020 No Yes exposed I10 Essential (primary) hypertension 04/11/2020 No Yes Inactive Problems Resolved Problems Electronic Signature(Jeffrey Lin) Signed: 05/02/2020 8:54:26 AM By: Lenda KelpStone III, Lorriane Dehart PA-C Entered By: Lenda KelpStone III, Mirra Basilio on 05/02/2020 08:54:26 Lukach, Jeffrey SaintBOBBY G. ([id number]) -------------------------------------------------------------------------------- Progress Note Details Patient Name: Jeffrey Lin, Jeffrey G. Date of Service: 05/02/2020 9:00 AM Medical Record Number: [id number] Patient Account  Number: [id number] Date of Birth/Sex: 06/05/1975 (45 y.o. M) Treating RN: Rogers BlockerSanchez, Kenia Primary Care Provider: Kerby NoraBedsole, Amy Other Clinician: Lolita CramBurnette, Kyara Referring Provider: Kerby NoraBedsole, Amy Treating Provider/Extender: Rowan BlaseStone, Starlet Gallentine Weeks in Treatment: 3 Subjective Chief Complaint Information obtained from Patient Bilateral LE Lymphedema History of Present Illness (HPI) 09/20/2019 upon evaluation today patient presents for initial evaluation in our clinic concerning issues that he has been having with wounds over the bilateral lower extremities. Both legs seem to be fairly equally affected at this point based on what I am seeing. With that being said the patient also appears to be having some drainage which is somewhat blue/green in nature and does have me concerned about the possibility of infection. I explained this often can indicate a Pseudomonas infection despite the fact that the patient has been on antibiotics currently this could still be problematic. He currently does not have home health that is something that we did discussed the potential for looking into. I think that could be beneficial for him to be honest. The patient seems somewhat frustrated with the situation in general. He does have a history of lymphedema, hypertension, and he does ambulate and walk though not a lot and tells me that he really does not drive unless is absolutely forced for something he absolutely has to do otherwise he is essentially homebound at  this point. He did have a believe his mother with him today. 09/28/2019 on evaluation today patient appears to be doing better in regard to his leg ulcers at this point. The antibiotic does seem to be helping and overall he tells me that his pain is less though he still is having discomfort. Fortunately there is no signs of active infection at this time. 10/04/2019 upon evaluation today patient appears to be doing well at this point in regard to his wounds. He in fact is  improving dramatically from the standpoint of his infection which I think is under great control. I see no evidence of infection systemically at this point and overall I am extremely pleased I think he is getting close to resolution to be honest though he still has several scattered areas at this point. 10/12/2019 upon evaluation today patient actually seems to be making excellent progress in regard to his leg ulcers at this point. He just has a small area on the left posterior and a small area on the right anterior leg that are still open. Everything else has completely resolved and seems to have done excellent up to this point. 10/19/2019 upon evaluation today patient actually appears to be doing much better on the right leg with regard to the open wounds in fact there is nothing remaining at this point which is great news. Overall I am very pleased with where things stand. I do believe that the compression has helped he still has edema which is quite significant I still believe lymphedema pumps would be beneficial for him. With regard to the left leg he still has again the edema and this is nonetheless quite significant despite compression therapy but still I do believe that he is doing much better with the compression I do believe again lymphedema pumps would be of benefit in this regard as well. He has been trying to elevate and move around/exercise much as possible but nonetheless I still think that the compression which he does have his compression socks today as well as lymphedema pumps would greatly benefit him as far as preventing future ulcerations/blisters that would lead to further expensive wound care visits. 10/25/2019 on evaluation today patient appears to be doing excellent in regard to his legs bilaterally. In fact there is only one small area on the posterior aspect of his left leg that appears to be an issue at this time. Fortunately there is no signs of active infection which is  great news. No fevers, chills, nausea, vomiting, or diarrhea. I do believe that for all intents purposes this is completely healed I think it would be appropriate for [place] to make a referral to the lymphedema clinic for him at this point. 11/02/2019 on evaluation today patient appears to be doing well with regard to his legs bilaterally. I do not see anything that remains open at this point he still has swelling but he does have new compression socks that he got as well. Fortunately there is no signs of active infection at this time which is great news. We did make a referral to lymphedema clinic he has not heard from them yet but I did recommend him giving them a call and we can give him the number for that today. 11/16/2019 upon evaluation today patient appears to be doing well with regard to his right leg. Unfortunately his left leg has started to weep and drain again he had a couple blister areas that are of concern at this point as well. Subsequently  I do believe that the patient would benefit from likely reinitiation of compression wrapping of the left leg based on what I am seeing currently. Fortunately there is no signs of active infection at this time. 11/23/2019 upon evaluation today patient actually appears to be making some good progress in regard to his lower extremities. Fortunately there is no evidence of active infection which is great news and overall I am extremely pleased with where things stand today. The patient may still have a couple areas that are leaking on the left leg but in general he has improved dramatically. Of note he did receive his lymphedema pumps this week and is supposed to have somebody coming out to show him how to use them and get them set up for him later this week. 11/29/2019 upon evaluation today patient appears to be doing very well in regard to his leg ulcers. In fact the left leg and the right leg both appear to be for the most part closed his right leg is a  little bit more swollen and again we have not been wrapping this so that may be the reason. Nonetheless I do believe that the patient may benefit from wrapping both legs 1 more week just to make sure nothing reopens even though it seems to be doing quite well I am hopeful he will be ready for discharge at that point next week. 12/07/2019 upon evaluation today patient appears to be doing excellent in regard to his lower extremities. In fact it does appear today that everything is completely closed he is healed and doing very well. He tells me that the lymphedema pump wrap will be coming out on Friday she was supposed to come today but was not feeling well therefore she is coming Friday to get him set up with his pumps. That will obviously be very beneficial for him. Readmission: Jeffrey Lin, SHECKLER ([id number]) 04/11/2020 upon evaluation today patient presents for reevaluation with regard to wound on his right leg which he tells me just recently reopened. He is a patient of mine from previous that has done quite well over the past several months. He is been using his lymphedema pumps as well as his compression stockings. He tells me that that has kept things under control although they have never really been as small as they were when he was here in the clinic being seen previous. Nonetheless he tells me that since November he is really had no significant issues just in the past week or so with the current wound. 04/18/2020 upon evaluation today patient appears to be doing well with regard to his right lower extremity in fact the wound that was open is completely closed there are no open wounds at this point which is great news. His legs also significantly smaller and just 1 week with a 4-layer compression wrap he actually decreased by around 15 cm in circumference which is significant. With that being said to be honest his leg looks to be doing great his left leg however is not doing so great. For that  reason I think there were probably need to wrap him today both legs in order to keep swelling under control and get things under control with regard to his left leg. Subsequently I did discuss with the patient that also believe he needs to probably be seen at lymphedema clinic we made this referral last year but I think there might have been some staffing issues going on at that time with lymphedema clinic although  not 100% sure either way I really think he would benefit from this and currently I think it is good to be a necessity because he cannot even get the wraps that we had given him previous around his leg on the left at all and I am sure he cannot get a compression sock on. He is trying to use his lymphedema pumps every day and he likes these but at the same time they are not really doing the job alone. He needs better compression. 05/02/20 upon evaluation today patient actually appears to be completely healed and is doing excellent at this time. There does not appear to be any signs of active infection which is great news and overall very pleased in that regard. Unfortunately getting him into the lymphedema clinic is taking much longer than what we would hope for. Objective Constitutional Well-nourished and well-hydrated in no acute distress. Vitals Time Taken: 8:25 AM, Height: 78 in, Weight: 280 lbs, BMI: 32.4, Temperature: 98.2 F, Pulse: 109 bpm, Respiratory Rate: 16 breaths/min, Blood Pressure: 157/102 mmHg. Respiratory normal breathing without difficulty. Psychiatric this patient is able to make decisions and demonstrates good insight into disease process. Alert and Oriented x 3. pleasant and cooperative. General Notes: Upon inspection patient'Jeffrey Lin wound bed actually showed signs of good granulation epithelization at this point. There does not appear to be any signs of active infection which is great news and overall I am extremely pleased with where things stand at this point. No  fevers, chills, nausea, vomiting, or diarrhea. Other Condition(Jeffrey Lin) Patient presents with Lymphedema located on the Left Leg. General Notes: no open areas Patient presents with Lymphedema located on the Right Leg. Assessment Active Problems ICD-10 Lymphedema, not elsewhere classified Non-pressure chronic ulcer of other part of right lower leg with fat layer exposed Essential (primary) hypertension Plan AUDWIN, SEMPER ([id number]) Discharge From [facility] Services: Discharge from Wound Care Center Treatment Complete Wear compression garments daily. Put garments on first thing when you wake up and remove them before bed. 1. Would recommend currently that we going continue with the wound care measures as before and the patient is in agreement with the plan this includes the use of the compression he does have his compression socks we put those on him today since he is completely healed I do not see any need for ongoing treatment here. 2. He really does need to get in with lymphedema clinic if he does not hear from them in the next 2 weeks he is to let me know so we can follow- up on that as well. Hopefully he will get in sooner rather than later. 3. I did advise he could increase to 45 on the pressure for his lymphedema pumps to see if that would be of benefit as well he is going to give that a shot. We will see him back for follow-up visit as needed. Electronic Signature(Jeffrey Lin) Signed: 05/02/2020 6:13:01 PM By: Lenda Kelp PA-C Entered By: Lenda Kelp on 05/02/2020 18:13:01 TIVIS, WHERRY ([id number]) -------------------------------------------------------------------------------- SuperBill Details Patient Name: Jeffrey Lin Date of Service: 05/02/2020 Medical Record Number: [id number] Patient Account Number: [id number] Date of Birth/Sex: Jan 29, 1975 (45 y.o. M) Treating RN: Rogers Blocker Primary Care Provider: Kerby Nora Other Clinician: Lolita Cram Referring Provider:  Kerby Nora Treating Provider/Extender: Rowan Blase in Treatment: 3 Diagnosis Coding ICD-10 Codes Code Description I89.0 Lymphedema, not elsewhere classified L97.812 Non-pressure chronic ulcer of other part of right lower leg with fat layer exposed I10 Essential (primary) hypertension  Facility Procedures CPT4 Code: 16109604 Description: [phone] - WOUND CARE VISIT-LEV 2 EST PT Modifier: Quantity: 1 Physician Procedures CPT4 Code: 1191478 Description: 99213 - WC PHYS LEVEL 3 - EST PT Modifier: Quantity: 1 CPT4 Code: Description: ICD-10 Diagnosis Description I89.0 Lymphedema, not elsewhere classified L97.812 Non-pressure chronic ulcer of other part of right lower leg with fat la I10 Essential (primary) hypertension Modifier: yer exposed Quantity: Electronic Signature(Jeffrey Lin) Signed: 05/02/2020 6:13:22 PM By: Lenda Kelp PA-C Previous Signature: 05/02/2020 4:59:45 PM Version By: Phillis Haggis, Dondra Prader RN Entered By: Lenda Kelp on 05/02/2020 18:13:22

## 2020-05-02 NOTE — Progress Notes (Signed)
Jeffrey, Lin ([id number]) Visit Report for 05/02/2020 Arrival Information Details Patient Name: Jeffrey Lin, Jeffrey Lin Date of Service: 05/02/2020 9:00 AM Medical Record Number: [id number] Patient Account Number: [id number] Date of Birth/Sex: 08/07/1975 (45 y.o. M) Treating RN: Rogers Blocker Primary Care Provider: Kerby Nora Other Clinician: Lolita Cram Referring Provider: Kerby Nora Treating Provider/Extender: Rowan Blase in Treatment: 3 Visit Information History Since Last Visit Added or deleted any medications: No Patient Arrived: Ambulatory Had a fall or experienced change in No Arrival Time: 09:07 activities of daily living that may affect Accompanied By: self risk of falls: Transfer Assistance: None Hospitalized since last visit: No Patient Identification Verified: Yes Pain Present Now: Yes Secondary Verification Process Completed: Yes Patient Has Alerts: Yes Patient Alerts: ABI left .88 09/20/2019 ABI right .81 09/20/2019 Electronic Signature(s) Signed: 05/02/2020 4:38:50 PM By: Lolita Cram Entered By: Lolita Cram on 05/02/2020 09:07:23 Jeffrey Lin ([id number]) -------------------------------------------------------------------------------- Clinic Level of Care Assessment Details Patient Name: Jeffrey Lin Date of Service: 05/02/2020 9:00 AM Medical Record Number: [id number] Patient Account Number: [id number] Date of Birth/Sex: [birth year] (45 y.o. M) Treating RN: Rogers Blocker Primary Care Provider: Kerby Nora Other Clinician: Lolita Cram Referring Provider: Kerby Nora Treating Provider/Extender: Rowan Blase in Treatment: 3 Clinic Level of Care Assessment Items TOOL 4 Quantity Score X - Use when only an EandM is performed on FOLLOW-UP visit 1 0 ASSESSMENTS - Nursing Assessment / Reassessment X - Reassessment of Co-morbidities (includes updates in patient status) 1 10 X- 1 5 Reassessment of Adherence to Treatment  Plan ASSESSMENTS - Wound and Skin Assessment / Reassessment []  - Simple Wound Assessment / Reassessment - one wound 0 []  - 0 Complex Wound Assessment / Reassessment - multiple wounds []  - 0 Dermatologic / Skin Assessment (not related to wound area) ASSESSMENTS - Focused Assessment X - Circumferential Edema Measurements - multi extremities 1 5 []  - 0 Nutritional Assessment / Counseling / Intervention []  - 0 Lower Extremity Assessment (monofilament, tuning fork, pulses) []  - 0 Peripheral Arterial Disease Assessment (using hand held doppler) ASSESSMENTS - Ostomy and/or Continence Assessment and Care []  - Incontinence Assessment and Management 0 []  - 0 Ostomy Care Assessment and Management (repouching, etc.) PROCESS - Coordination of Care X - Simple Patient / Family Education for ongoing care 1 15 []  - 0 Complex (extensive) Patient / Family Education for ongoing care []  - 0 Staff obtains , Records, Test Results / Process Orders []  - 0 Staff telephones HHA, Nursing Homes / Clarify orders / etc []  - 0 Routine Transfer to another Facility (non-emergent condition) []  - 0 Routine Hospital Admission (non-emergent condition) []  - 0 New Admissions / / Ordering NPWT, Apligraf, etc. []  - 0 Emergency Hospital Admission (emergent condition) X- 1 10 Simple Discharge Coordination []  - 0 Complex (extensive) Discharge Coordination PROCESS - Special Needs []  - Pediatric / Minor Patient Management 0 []  - 0 Isolation Patient Management []  - 0 Hearing / Language / Visual special needs []  - 0 Assessment of Community assistance (transportation, D/C planning, etc.) []  - 0 Additional assistance / Altered mentation []  - 0 Support Surface(s) Assessment (bed, cushion, seat, etc.) INTERVENTIONS - Wound Cleansing / Measurement Swaby, Nicasio G. ( ) []  - 0 Simple Wound Cleansing - one wound []  - 0 Complex Wound Cleansing - multiple wounds []  - 0 Wound  Imaging (photographs - any number of wounds) []  - 0 Wound Tracing (instead of photographs) []  - 0 Simple Wound Measurement - one wound []  - 0  Complex Wound Measurement - multiple wounds INTERVENTIONS - Wound Dressings []  - Small Wound Dressing one or multiple wounds 0 []  - 0 Medium Wound Dressing one or multiple wounds []  - 0 Large Wound Dressing one or multiple wounds []  - 0 Application of Medications - topical []  - 0 Application of Medications - injection INTERVENTIONS - Miscellaneous []  - External ear exam 0 []  - 0 Specimen Collection (cultures, biopsies, blood, body fluids, etc.) []  - 0 Specimen(s) / Culture(s) sent or taken to Lab for analysis []  - 0 Patient Transfer (multiple staff / / Similar devices) []  - 0 Simple Staple / Suture removal (25 or less) []  - 0 Complex Staple / Suture removal (26 or more) []  - 0 Hypo / Hyperglycemic Management (close monitor of Blood Glucose) []  - 0 Ankle / Brachial Index (ABI) - do not check if billed separately X- 1 5 Vital Signs Has the patient been seen at the hospital within the last three years: Yes Total Score: 50 Level Of Care: New/Established - Level 2 Electronic Signature(s) Signed: 05/02/2020 4:59:45 PM By: , RN Entered By: , on 05/02/2020 09:43:02 ( ) -------------------------------------------------------------------------------- Encounter Discharge Information Details Patient Name: Jeffrey Lin Date of Service: 05/02/2020 9:00 AM Medical Record Number: Patient Account Number: Date of Birth/Sex: 1975/11/11 (45 y.o. M) Treating RN: Phillis Haggis Primary Care Provider: Dondra Prader Other Clinician: Phillis Haggis Referring Provider: Dondra Prader Treating Provider/Extender: 07/02/2020 in Treatment: 3 Encounter Discharge Information Items Discharge Condition: Stable Ambulatory Status: Ambulatory Discharge  Destination: Home Transportation: Private Auto Accompanied By: self Schedule Follow-up Appointment: No Clinical Summary of Care: Electronic Signature(s) Signed: 05/02/2020 4:59:45 PM By: [id number], Jeffrey Gillis RN Entered By: 07/02/2020, [id number] on 05/02/2020 10:02:11 07/26/1975 (56) -------------------------------------------------------------------------------- Lower Extremity Assessment Details Patient Name: Rogers Blocker Date of Service: 05/02/2020 9:00 AM Medical Record Number: Lolita Cram Patient Account Number: Kerby Nora Date of Birth/Sex: 1975/04/17 (45 y.o. M) Treating RN: Phillis Haggis Primary Care Provider: Dondra Prader Other Clinician: Phillis Haggis Referring Provider: Dondra Prader Treating Provider/Extender: 07/02/2020 Weeks in Treatment: 3 Edema Assessment Assessed: [Left: No] [Right: No] [Left: Edema] [Right: :] Calf Left: Right: Point of Measurement: 46 cm From Medial Instep 41.3 cm 37 cm Ankle Left: Right: Point of Measurement: 12 cm From Medial Instep 31 cm 24.3 cm Vascular Assessment Pulses: Dorsalis Pedis Palpable: [Left:Yes] [Right:Yes] Electronic Signature(s) Signed: 05/02/2020 4:38:50 PM By: [id number] Signed: 05/02/2020 4:59:45 PM By: 07/02/2020, [id number] RN Entered By: [id number] on 05/02/2020 09:10:43 Verdi, 56 (Rogers Blocker) -------------------------------------------------------------------------------- Multi Wound Chart Details Patient Name: Kerby Nora Date of Service: 05/02/2020 9:00 AM Medical Record Number: Kerby Nora Patient Account Number: Allen Derry Date of Birth/Sex: [birth year] (45 y.o. M) Treating RN: 07/02/2020 Primary Care Provider: Phillis Haggis Other Clinician: Dondra Prader Referring Provider: Lolita Cram Treating Provider/Extender: 07/02/2020 in Treatment: 3 Vital Signs Height(in): 78 Pulse(bpm): 109 Weight(lbs): 280 Blood Pressure(mmHg): 157/102 Body Mass Index(BMI):  32 Temperature(F): 98.2 Respiratory Rate(breaths/min): 16 Wound Assessments Treatment Notes Electronic Signature(s) Signed: 05/02/2020 4:59:45 PM By: [id number], Jeffrey Gillis RN Entered By: 07/02/2020, [id number] on 05/02/2020 09:31:53 07/26/1975 (56) -------------------------------------------------------------------------------- Multi-Disciplinary Care Plan Details Patient Name: Rogers Blocker Date of Service: 05/02/2020 9:00 AM Medical Record Number: Lolita Cram Patient Account Number: Kerby Nora Date of Birth/Sex: [birth year] (45 y.o. M) Treating RN: Phillis Haggis Primary Care Provider: Dondra Prader Other Clinician: Phillis Haggis Referring Provider: Dondra Prader Treating Provider/Extender: 07/02/2020  Weeks in Treatment: 3 Active Inactive Electronic Signature(s) Signed: 05/02/2020 4:59:45 PM By: Phillis Haggis, Dondra Prader RN Entered By: Phillis Haggis, Dondra Prader on 05/02/2020 09:31:30 Jeffrey Lin ([id number]) -------------------------------------------------------------------------------- Non-Wound Condition Assessment Details Patient Name: Jeffrey Lin Date of Service: 05/02/2020 9:00 AM Medical Record Number: [id number] Patient Account Number: [id number] Date of Birth/Gender: 02-21-1975 (45 y.o. M) Treating RN: Rogers Blocker Primary Care Physician: Kerby Nora Other Clinician: Lolita Cram Referring Physician: Kerby Nora Treating Physician/Extender: Allen Derry Weeks in Treatment: 3 Non-Wound Condition: Condition: Lymphedema Location: Leg Side: Left Photos Notes no open areas Electronic Signature(s) Signed: 05/02/2020 4:38:50 PM By: Lolita Cram Signed: 05/02/2020 4:59:45 PM By: Phillis Haggis, Dondra Prader RN Entered By: Lolita Cram on 05/02/2020 09:12:07 Jeffrey Lin ([id number]) -------------------------------------------------------------------------------- Non-Wound Condition Assessment Details Patient Name: Jeffrey Lin Date  of Service: 05/02/2020 9:00 AM Medical Record Number: [id number] Patient Account Number: [id number] Date of Birth/Gender: [birth year] (45 y.o. M) Treating RN: Rogers Blocker Primary Care Physician: Kerby Nora Other Clinician: Lolita Cram Referring Physician: Kerby Nora Treating Physician/Extender: Allen Derry Weeks in Treatment: 3 Non-Wound Condition: Condition: Lymphedema Location: Leg Side: Right Photos Electronic Signature(s) Signed: 05/02/2020 4:38:50 PM By: Lolita Cram Signed: 05/02/2020 4:59:45 PM By: Phillis Haggis, Dondra Prader RN Entered By: Lolita Cram on 05/02/2020 09:12:07 AARAN, ENBERG ([id number]) -------------------------------------------------------------------------------- Pain Assessment Details Patient Name: Jeffrey Lin Date of Service: 05/02/2020 9:00 AM Medical Record Number: [id number] Patient Account Number: [id number] Date of Birth/Sex: January 30, 1975 (45 y.o. M) Treating RN: Rogers Blocker Primary Care Provider: Kerby Nora Other Clinician: Lolita Cram Referring Provider: Kerby Nora Treating Provider/Extender: Rowan Blase in Treatment: 3 Active Problems Location of Pain Severity and Description of Pain Patient Has Paino Yes Site Locations Rate the pain. Current Pain Level: 7 Pain Management and Medication Current Pain Management: Electronic Signature(s) Signed: 05/02/2020 4:38:50 PM By: Lolita Cram Signed: 05/02/2020 4:59:45 PM By: Phillis Haggis, Dondra Prader RN Entered By: Lolita Cram on 05/02/2020 09:08:11 OSCEOLA, DEPAZ ([id number]) -------------------------------------------------------------------------------- Patient/Caregiver Education Details Patient Name: Jeffrey Lin Date of Service: 05/02/2020 9:00 AM Medical Record Number: [id number] Patient Account Number: [id number] Date of Birth/Gender: 1975-11-17 (44 y.o. M) Treating RN: Rogers Blocker Primary Care Physician: Kerby Nora Other Clinician: Lolita Cram Referring Physician: Kerby Nora Treating Physician/Extender: Rowan Blase in Treatment: 3 Education Assessment Education Provided To: Patient Education Topics Provided Notes lymphedema pump education, reinforced importance of compression Electronic Signature(s) Signed: 05/02/2020 4:59:45 PM By: Phillis Haggis, Dondra Prader RN Entered By: Phillis Haggis, Dondra Prader on 05/02/2020 09:43:51 TREVELLE, MCGURN ([id number]) -------------------------------------------------------------------------------- Vitals Details Patient Name: Jeffrey Lin Date of Service: 05/02/2020 9:00 AM Medical Record Number: [id number] Patient Account Number: [id number] Date of Birth/Sex: 01/09/1976 (45 y.o. M) Treating RN: Rogers Blocker Primary Care Provider: Kerby Nora Other Clinician: Lolita Cram Referring Provider: Kerby Nora Treating Provider/Extender: Rowan Blase in Treatment: 3 Vital Signs Time Taken: 08:25 Temperature (F): 98.2 Height (in): 78 Pulse (bpm): 109 Weight (lbs): 280 Respiratory Rate (breaths/min): 16 Body Mass Index (BMI): 32.4 Blood Pressure (mmHg): 157/102 Reference Range: 80 - 120 mg / dl Electronic Signature(s) Signed: 05/02/2020 4:38:50 PM By: Lolita Cram Entered By: Lolita Cram on 05/02/2020 09:07:46

## 2020-05-04 ENCOUNTER — Telehealth: Payer: Self-pay | Admitting: *Deleted

## 2020-05-04 MED ORDER — TIZANIDINE HCL 2 MG PO TABS: 2.0000 mg | ORAL_TABLET | Freq: Three times a day (TID) | ORAL | 0 refills | Status: DC | PRN

## 2020-05-04 NOTE — Telephone Encounter (Signed)
Received fax from [facility] stating insurance will no longer pay for tizanidine capsules but will pay for tablets.  They are asking for Rx for tablets.   Please sign orders below if okay to send.

## 2020-06-01 ENCOUNTER — Ambulatory Visit: Payer: PPO | Admitting: Urology

## 2020-06-29 ENCOUNTER — Other Ambulatory Visit: Payer: Self-pay | Admitting: Family Medicine

## 2020-06-29 NOTE — Telephone Encounter (Signed)
Last office visit 03/24/2020 for lymphedema of both lower extremities.  Last refilled 05/04/2020 for #[id number] with no refills.  No future appointments.

## 2020-08-23 ENCOUNTER — Other Ambulatory Visit: Payer: Self-pay

## 2020-08-23 ENCOUNTER — Ambulatory Visit: Payer: Medicare Other | Attending: Physician Assistant | Admitting: Occupational Therapy

## 2020-08-23 ENCOUNTER — Encounter: Payer: Self-pay | Admitting: Occupational Therapy

## 2020-08-23 DIAGNOSIS — I890 Lymphedema, not elsewhere classified: Secondary | ICD-10-CM | POA: Insufficient documentation

## 2020-08-23 NOTE — Patient Instructions (Signed)

## 2020-08-24 ENCOUNTER — Encounter: Payer: Self-pay | Admitting: Occupational Therapy

## 2020-08-24 NOTE — Therapy (Signed)
[facility] MAIN [facility] SERVICES [address] [place], [place], [address] Phone: [phone]   Fax:  [phone]  Occupational Therapy Evaluation  Patient Details  Name: Jeffrey Lin MRN: [id number] Date of Birth: April 28, 1975 Referring Provider (OT): Allen Derry, [place]   Encounter Date: 08/23/2020   OT End of Session - 08/23/20 1103     Visit Number 1    Number of Visits 36    Date for OT Re-Evaluation 11/21/20    OT Start Time 1000    OT Stop Time 1105    OT Time Calculation (min) 65 min    Behavior During Therapy Flat affect;WFL for tasks assessed/performed             Past Medical History:  Diagnosis Date   Anxiety    Chronic leg pain    Depression    Essential hypertension, benign 10/20/2015   Schizoaffective disorder [facility])     Past Surgical History:  Procedure Laterality Date   Block procedure at pain center  03/27/06   Bone graft from hip to jaw  2004   Bone graft right side of head to jaw  2006   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   Subjective Assessment - 08/24/20 1057     Subjective  Jeffrey Lin is referred to Occupational Therapy by Allen Derry, PA-C for evaluation and treatmnet of BLE lymphedema. Jeffrey Lin is accompanied byhis mother, Carney Bern, today. Jeffrey Lin reports that he has had leg swelling for quite sometime. He is unsure when exactly it started, but it has slowly gotten worse . Jeffrey. Lin reports he has compression socks he bought at [place], but he does not wear them. He has a basic pneumatic "pump", but does not use it very often.Pt reports he has had wound care for leg ulcers, but has not previously  undergne lymphedema care. Pt reports   he can sometimes fel the sensation of "water running down his leg". His goals are to reduce leg pain, get the swelling down and prevent future wounds.    Patient is accompanied by: Family member    Pertinent History H&P relevant to BLE lymphedema:   tobacco abuse, HTN, hx BLE ulcers, hx cellulitis, Hx DVT, Hx patellar fx, chronic BLE pain, chronic BLE lymphedema, neuropathy, Hx pneumonia, snoring. chronic diastolic heart failure, sinus tachycardia, chronic back pain, psychotic mood disorder/ Schitzoaffective disorder- depressive type, snoring    Limitations chronic leg pain and swelling, decreased balance,    Repetition Increases Symptoms    Special Tests Intake FOTO: 45/100    Patient Stated Goals get pain and swelling down in my legs    Currently in Pain? Yes    Pain Score 8     Pain Location Leg    Pain Orientation Left;Right    Pain Descriptors / Indicators Aching;Sore;Burning;Constant;Tender;Stabbing;Tightness;Discomfort;Pressure;Tiring;Heaviness    Pain Type Chronic pain    Pain Onset More than a month ago   approximately 4 years ago   Pain Frequency Intermittent    Aggravating Factors  walking, standing, dependent sitting > 15 minutes    Pain Relieving Factors resting, leg elevation,    Effect of Pain  on Daily Activities chronic leg pain and associated swelling, impaired balance, ifalls risk (neuropathy) mpaired standing and walking tolerance, impaired tolerance for dependent sitting,    Multiple Pain Sites Yes    Pain Location Back    Pain Type Chronic pain    Pain Onset More than a month ago    Pain Frequency Intermittent               [facility] OT Assessment - 08/24/20 0001       IADL   Prior Level of Function Shopping Mod-max A    Shopping Needs to be accompanied on any shopping trip    Prior Level of Function Light Housekeeping n/a    Light Housekeeping Does not participate in any housekeeping tasks    Prior Level of Function Meal  Prep warms pizza    Meal Prep Able to complete simple warm meal prep      Mobility   Mobility Status Independent      Vision - History   Baseline Vision Other (comment)    Visual History --   denies wearing glasseds     Activity Tolerance   Activity Tolerance Endurance does not limit participation in activity    Activity Tolerance Comments activities requiring standing, walking or dependent sitting for >15 minutes are limited by decreased standing, walking and dependent sitting tolerance      Cognition   Overall Cognitive Status Within Functional Limits for tasks assessed    Cognition Comments sleepy for first 30 minutes of 60 minute session; flat affect      Observation/Other Assessments   Observations stasis dermatitis, L>R. Skin redness, dryness and flaking. Cobblestone texture with induration.Non-pitting. Feet involved with strong + Stemmer sign base of toes, L>R, nail dystrophy,    Skin Integrity stasis    Focus on Therapeutic Outcomes (FOTO)  Intake 45/100    Outcome Measures BLE comparative limb volumetrics TBA      Posture/Postural Control   Posture/Postural Control Postural limitations    Postural Limitations Forward head;Rounded Shoulders;Posterior pelvic tilt;Increased thoracic kyphosis    Posture Comments scuffs feet when walkiing. Does not lift feet up      Sensation   Light Touch Impaired by gross assessment      Coordination   Gross Motor Movements are Fluid and Coordinated Yes    Fine Motor Movements are Fluid and Coordinated Not tested      ROM / Strength   AROM / PROM / Strength AROM;Strength      AROM   Overall AROM  Within functional limits for tasks performed    Overall AROM Comments decreased knee AROM due to swelling and skin approximation    AROM Assessment Site Shoulder;Forearm;Wrist;Elbow;Hip;Knee;Ankle      Hand Function   Right Hand Gross Grasp Functional    Left Hand Gross Grasp Functional              Moderate, stage II, 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  Skin  Description Hyper-Keratosis Peau' de Orange Shiny Tight Fibrotic/ Indurated Fatty Doughy spongy    x  x Moderate L>R        Skin dry Flaky WNL Macerated   x x     Color Redness Present Pallor Blanching Hemosiderin Staining Mottled   L>R     x   Odor Malodorous Yeast Fungal infection  Absent      x   Temperature Warm Cool wnl    x     Pitting Edema   1+  2+ 3+ 4+ Non-pitting        x   Girth Symmetrical Asymmetrical                   Distribution     L>R Tibial tuberosity to feet      Strong + x Negative       Lymphorrhea History Of:  Present Absent   x  x    Wounds History Of Present Absent Venous Arterial Pressure Mechanical   x          Signs of Infection Redness Warmth Erythema Acute Swelling Drainage Borders   x x                Sensation Light Touch Deep pressure Hypersensitivty   Present Impaired Present Impaired Absent Impaired   x  x       Nails WNL   Fungus nail dystrophy     x   Hair Growth Symmetrical Asymmetrical       Skin Creases Base of toes  Ankles   Base of Fingers Medial Thighs         Abdominal pannus Lobules  Face/neck   x x                OT Treatments/Exercises (OP) - 08/24/20 0001       Transfers   Transfers Sit to Stand;Stand to Sit    Sit to Stand With armrests;With upper extremity assist    Stand to Sit With upper extremity assist;With armrests      ADLs   Grooming impaired: difficulty grooming nails, inspecting skin on feet,    LB Dressing impaired: difficulty fitting preferred street shoes and LB clothing  due to limb swelling    Bathing impaired standing balance. Must use shower chair and handheld shower to bathe (modified independence)    Functional Mobility impaired transfers    Cooking impaired    Home Maintenance impaired    Driving impaired    Work on disability    Leisure family, staying home , listening to music, doing art work    ADL Education Given Yes       Manual Therapy   Manual Therapy Edema management                   OT Education - 08/23/20 1022     Education Details Provided Pt education regarding lymphatic structure and function, etiology, onset patterns and stages of progression. Taught interaction between blood circulatory system and lymphatic circulation.Discussed  impact of gravity and obesity on lymphatic function. Outlined Complete Decongestive Therapy (CDT)  as standard of care and provided in depth information regarding 4 primary components of both Intensive and Self Management Phases, including Manual Lymph Drainage (MLD), compression wrapping and garments, skin care, and therapeutic exercise.   Homero Fellers discussion of high burden of care and contributing impact of existing co morbidities. We discussed  Importance of daily, ongoing LE self-care essential to retaining clinical gains and limiting progression.    Discussed need to have daily caregiver assistance with lymphedema self-care home program due to inability to reach feet and legs to perform compression bandaging, skin care and simple self MLD throughout Intensive Phase of Complete Decongestive Therapy. Without daily CG assistance with this protocol ongoing, prognosis for reducing swelling and limiting lymphedema progression is poor.    Person(s) Educated Patient;Parent(s)    Methods Explanation;Demonstration;Handout    Comprehension Verbalized understanding;Returned demonstration;Need further instruction  OT Long Term Goals - 08/24/20 1206       OT LONG TERM GOAL #1   Title Patient's intake functional score on the FOTO tool is 45 out of 100 (higher number = greater function). Given this patient's comorbidities she will experience at least an increase in function of 4 points, or higher, to increase level of independence with basic ADLs and functional ambulation and mobility.    Baseline 45/100    Time 12    Period Weeks    Status New     Target Date 11/22/20      OT LONG TERM GOAL #2   Title Pt will be able to apply multi-layer, short stretch compression wraps to one leg at a time using correct gradient techniques with maximum caregiver assistance in an effort  to return the affected  limb(s)  to premorbid size and shape, to limit pain and infection risk, and to improve functional mobility and ambulation  for ADLs.    Baseline dependent    Time 4    Period Days    Status New    Target Date --   4th OR Rx visit\     OT LONG TERM GOAL #3   Title Pt will achieve at least 10% limb volume reduction Bilaterally during Intensive Phase CDT to prevent re-accumulation of lymphatic congestion and progression of fibrosis, to limit infection risk, to improve functional ambulation and transfers, and to promote wound healing.    Baseline dependent    Time 12    Period Weeks    Status New    Target Date 11/22/20      OT LONG TERM GOAL #4   Title With max caregiver assistance Pt will achieve and sustain a least 85% compliance with all LE self-care home program components throughout Intensive Phase CDT, including elevation when seated, daily skin inspection and care, lymphatic pumping ther ex, 23/7 compression wraps and simple self-MLD. Max caregiver assistance is necessary to ensure optimal limb volume reduction, to limit infection risk and to limit LE progression. Pt will arrange for assistance before we begin CDT.    Baseline dependent    Time 12    Period Weeks    Status New    Target Date 11/22/20      OT LONG TERM GOAL #5   Title Using assistive devices (modified independence) Pt will be able to don and doff appropriate daytime compression garments to limit lymphatic re-accumulation and LE progression before transitioning to self-management phase of CDT.Using assistive devices (modified independence) Pt will be able to don and doff appropriate daytime compression garments to limit lymphatic re-accumulation and LE progression before  transitioning to self-management phase of CDT.    Baseline Max A    Time 12    Period Weeks    Status New    Target Date 11/22/20                   Plan - 08/24/20 1159     Clinical Impression Statement Swelling is concentrated below the knees w/ L>R. Edema is thickened and non-pitting. Skin is indurated with dense fibrosis, reddened and minimally flexible. Skin is dry and flakey and cool to the touch with venostasis skin changes including redness, cobblestone-like, peau de orange texture. Feet are swollen, Stemmer sign is +, and deep skin creases are observed at malleoli and base of toes. Lower extremity swelling and pain worsens with all functional activities requiring standing, walking and/ or dependent sitting > 15  minutes. Existing compression knee high stockings do not control swelling by report, although these are not available for assessment today. Pt has a basic sequential pneumatic device that provides retrograde massage, but he does not use it. Chronic, progressive LE limits functional ambulation and transfers, and performance in all occupational domains, including basic and instrumental ADLs, leisure pursuits and productive activities and social participation. Jeffrey. Stairs  will benefit from skilled Occupational Therapy for Intensive and Self-Management Phase Complete Decongestive Therapy (CDT) to include manual lymphatic drainage (MLD), skin care, therapeutic exercise and compression therapies. CDT is medically necessary to reduce limb swelling and associated pain, to limit progression, to reduce infection risk and to limit further functional decline.  Because Jeffrey. Dutter has difficulty reaching his feet to don and doff his shoes and socks he will need daily caregiver assistance to successfully perform lymphedema self-care home program between OT visits and going forward into the Self-Management Phase.  WITHOUT DAILY CG ASSISTANCE WITH COMPRESSION WRAPS AND STOCKINGS Pt's PROGNOSIS  FOR SWELLING REDUCTION, IMPROVEMENT IN SKIN CONDITION AND DECREASED INFECTION RISK IS POOR. WITH DILIGENT DAILY CG ASSISTANCE DURING AND AFTER CLINICAL TREATMENT COURSE, PROGNOSIS FOR IMPROVED CONTROL OF CHRONIC, PROGRESSIVE LYMPHEDEMA IS GOOD.    OT Occupational Profile and History Comprehensive Assessment- Review of records and extensive additional review of physical, cognitive, psychosocial history related to current functional performance    Occupational performance deficits (Please refer to evaluation for details): IADL's;ADL's;Rest and Sleep;Work;Leisure;Social Participation   w max CG assist for home program   Body Structure / Function / Physical Skills ADL;Edema;Skin integrity;Pain;Decreased knowledge of precautions;Decreased knowledge of use of DME;IADL    Rehab Potential Good    Clinical Decision Making Several treatment options, min-mod task modification necessary    Comorbidities Affecting Occupational Performance: Presence of comorbidities impacting occupational performance    Comorbidities impacting occupational performance description: suspected CVI, hx cellulitis, hx dvt    Modification or Assistance to Complete Evaluation  Min-Moderate modification of tasks or assist with assess necessary to complete eval    OT Frequency 2x / week    OT Duration 12 weeks   and PRN   OT Treatment/Interventions Self-care/ADL training;Therapeutic exercise;Manual Therapy;Coping strategies training;Therapeutic activities;Manual lymph drainage;DME and/or AE instruction;Compression bandaging;Other (comment);Patient/family education   skin care with low ph castor oil and/ or eucerin lotion   Plan Intensive and Self-Management Phases of Complete Decongestive Therapy (CDT) to include manual lymphatic drainage, skin care, compression wraps and garments, therapeutic exercise, caregiver training    Recommended Other Services fit with BLE, knee length, custom compression garments and HOS devices needed to limit  worsening fibrosis formation during HOS. Consider fitting with advanced, sequential , pneumatic compression device, or Flexitouch "pump" medically necessary for optimal level of independence with LE self-management at home over time    Consulted and Agree with Plan of Care Patient;Family member/caregiver             Patient will benefit from skilled therapeutic intervention in order to improve the following deficits and impairments:   Body Structure / Function / Physical Skills: ADL, Edema, Skin integrity, Pain, Decreased knowledge of precautions, Decreased knowledge of use of DME, IADL       Visit Diagnosis: Lymphedema, not elsewhere classified - Plan: Ot plan of care cert/re-cert    Problem List Patient Active Problem List   Diagnosis Date Noted   Ulcers of both lower legs (HCC) 09/09/2019   Lymphedema of both lower extremities 09/09/2019   Sinus tachycardia 08/06/2019   Chronic diastolic heart  failure (HCC)    Benign prostatic hyperplasia with lower urinary tract symptoms 07/02/2019   Snoring 07/02/2019   Cellulitis of left leg 05/15/2019   Left leg cellulitis 05/11/2019   Swelling abdomen 05/11/2019   Thiamine deficiency 12/28/2018   Pneumonia 03/28/2018   Subacute delirium 03/23/2018   Difficulty urinating 02/03/2018   Peripheral neuropathy 05/07/2017   Folate deficiency 05/07/2017   Pleural effusion, left 04/12/2016   Peripheral edema 02/29/2016   Hoarseness of voice 11/23/2015   Essential hypertension, benign 10/20/2015   Schizoaffective disorder, depressive type (HCC) 02/17/2015   Vitamin D deficiency 02/14/2015   Low back pain 10/27/2014   Dyspnea on exertion 07/19/2014   Acute upper back pain 05/21/2013   Psychotic mood disorder (HCC) 05/14/2012   History of DVT (deep vein thrombosis) 07/06/2010   History of patellar fracture 07/06/2010   HYPERCHOLESTEROLEMIA 10/13/2009   PREDIABETES 10/13/2009   Anxiety state 06/02/2006   TOBACCO ABUSE 06/02/2006    Chronic back pain 06/02/2006     Loel Dubonnet, MS, OTR/L, CLT-LANA 08/24/20 12:59 PM  [place] [facility] MAIN [facility] SERVICES [address] [place], [place], [address] Phone: [phone]   Fax:  [phone]  Name: Jeffrey Lin MRN: [id number] Date of Birth: 1975-07-04

## 2020-08-30 ENCOUNTER — Ambulatory Visit: Payer: Medicare Other | Admitting: Occupational Therapy

## 2020-08-30 ENCOUNTER — Other Ambulatory Visit: Payer: Self-pay

## 2020-08-30 DIAGNOSIS — I890 Lymphedema, not elsewhere classified: Secondary | ICD-10-CM | POA: Diagnosis not present

## 2020-08-30 NOTE — Therapy (Signed)
[facility] MAIN [facility] SERVICES [address] [place], [place], [address] Phone: [phone]   Fax:  [phone]  Occupational Therapy Treatment  Patient Details  Name: Jeffrey Lin MRN: [id number] Date of Birth: 1975/03/02 Referring Provider (OT): Allen Derry, [place]   Encounter Date: 08/30/2020   OT End of Session - 08/30/20 1013     Visit Number 2    Number of Visits 36    Date for OT Re-Evaluation 11/21/20    OT Start Time 1004    OT Stop Time 1115    OT Time Calculation (min) 71 min    Activity Tolerance Patient tolerated treatment well;No increased pain    Behavior During Therapy Flat affect;WFL for tasks assessed/performed             Past Medical History:  Diagnosis Date   Anxiety    Chronic leg pain    Depression    Essential hypertension, benign 10/20/2015   Schizoaffective disorder [facility])     Past Surgical History:  Procedure Laterality Date   Block procedure at pain center  03/27/06   Bone graft from hip to jaw  2004   Bone graft right side of head to jaw  2006   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   Subjective Assessment - 08/30/20 1014     Subjective  Jeffrey Lin presents for OT  treatment visit 2/ 36 to address BLE lymphedema. He is accompanied byhis mother, Jeffrey Lin, today. Mr. Treptow reports leg pan of 7/10. He denies other concerns this morning.    Patient is accompanied by: Family member    Pertinent History H&P relevant to BLE lymphedema:   tobacco abuse, HTN, hx BLE ulcers, hx cellulitis, Hx DVT, Hx patellar fx, chronic BLE pain, chronic BLE lymphedema, neuropathy, Hx pneumonia, snoring. chronic diastolic  heart failure, sinus tachycardia, chronic back pain, psychotic mood disorder/ Schitzoaffective disorder- depressive type, snoring    Limitations chronic leg pain and swelling, decreased balance,    Repetition Increases Symptoms    Special Tests Intake FOTO: 45/100    Patient Stated Goals get pain and swelling down in my legs    Pain Onset More than a month ago   approximately 4 years ago   Pain Onset More than a month ago                 LYMPHEDEMA/ONCOLOGY QUESTIONNAIRE - 08/30/20 0001       Lymphedema Assessments   Lymphedema Assessments Lower extremities      Right Lower Extremity Lymphedema   Other R (dominant)  leg limb volume ankle to popliteal fossa (A-D) measures 4279.0 ml.      Left Lower Extremity Lymphedema   Other L leg (Rx)  leg limb volume ankle to popliteal fossa (A-D) measures 5580.2 ml ml.    Other Limb volume differential (LVD) measures 23.3%, L>R                     OT Treatments/Exercises (OP) - 08/30/20 1135       ADLs   ADL Education Given Yes      Manual Therapy   Manual Therapy  Edema management;Compression Bandaging    Manual therapy comments Initial.BLE comparative limb volumetrics    Compression Bandaging Multilayer compression wraps from base of toes to below the knee using 8. 10 and 12 cm short stretch bandages   layered over 0.4 cm thick Rosidal foam and cotton stockinett.                    OT Education - 08/30/20 1138     Education Details Continued skilled Pt/caregiver education  And LE ADL training throughout visit for lymphedema self care/ home program, including compression wrapping, compression garment and device wear/care, lymphatic pumping ther ex, simple self-MLD, and skin care. Discussed initial volumetric measurements. Discussed all long term goals.    Person(s) Educated Patient;Parent(s)    Methods Explanation;Demonstration;Handout    Comprehension Verbalized understanding;Returned demonstration;Need  further instruction                 OT Long Term Goals - 08/24/20 1206       OT LONG TERM GOAL #1   Title Patient's intake functional score on the FOTO tool is 45 out of 100 (higher number = greater function). Given this patient's comorbidities she will experience at least an increase in function of 4 points, or higher, to increase level of independence with basic ADLs and functional ambulation and mobility.    Baseline 45/100    Time 12    Period Weeks    Status New    Target Date 11/22/20      OT LONG TERM GOAL #2   Title Pt will be able to apply multi-layer, short stretch compression wraps to one leg at a time using correct gradient techniques with maximum caregiver assistance in an effort  to return the affected  limb(s)  to premorbid size and shape, to limit pain and infection risk, and to improve functional mobility and ambulation  for ADLs.    Baseline dependent    Time 4    Period Days    Status New    Target Date --   4th OR Rx visit\     OT LONG TERM GOAL #3   Title Pt will achieve at least 10% limb volume reduction Bilaterally during Intensive Phase CDT to prevent re-accumulation of lymphatic congestion and progression of fibrosis, to limit infection risk, to improve functional ambulation and transfers, and to promote wound healing.    Baseline dependent    Time 12    Period Weeks    Status New    Target Date 11/22/20      OT LONG TERM GOAL #4   Title With max caregiver assistance Pt will achieve and sustain a least 85% compliance with all LE self-care home program components throughout Intensive Phase CDT, including elevation when seated, daily skin inspection and care, lymphatic pumping ther ex, 23/7 compression wraps and simple self-MLD. Max caregiver assistance is necessary to ensure optimal limb volume reduction, to limit infection risk and to limit LE progression. Pt will arrange for assistance before we begin CDT.    Baseline dependent    Time 12    Period  Weeks    Status New    Target Date 11/22/20      OT LONG TERM GOAL #5   Title Using assistive devices (modified independence) Pt will be able to don and doff appropriate daytime compression garments to limit lymphatic re-accumulation and LE progression before transitioning to self-management phase of CDT.Using assistive devices (modified independence) Pt will be able to  don and doff appropriate daytime compression garments to limit lymphatic re-accumulation and LE progression before transitioning to self-management phase of CDT.    Baseline Max A    Time 12    Period Weeks    Status New    Target Date 11/22/20                   Plan - 08/30/20 1132     Clinical Impression Statement Initial BLE comparative limb volumetrics reveal a 23.3% limb volume differential (LVD) below the knee s with L>R. Applied multilayer compression wrapps and commenced teaching Pt and his mom gradient principles and techniques. Pt more engaged and talkative throughout visit today with excellent participation, including questions and comments re Intensive Phase CDT. Cont as per [facility].    OT Occupational Profile and History Comprehensive Assessment- Review of records and extensive additional review of physical, cognitive, psychosocial history related to current functional performance    Occupational performance deficits (Please refer to evaluation for details): IADL's;ADL's;Rest and Sleep;Work;Leisure;Social Participation   w max CG assist for home program   Body Structure / Function / Physical Skills ADL;Edema;Skin integrity;Pain;Decreased knowledge of precautions;Decreased knowledge of use of DME;IADL    Rehab Potential Good    Clinical Decision Making Several treatment options, min-mod task modification necessary    Comorbidities Affecting Occupational Performance: Presence of comorbidities impacting occupational performance    Comorbidities impacting occupational performance description: suspected CVI, hx  cellulitis, hx dvt    Modification or Assistance to Complete Evaluation  Min-Moderate modification of tasks or assist with assess necessary to complete eval    OT Frequency 2x / week    OT Duration 12 weeks   and PRN   OT Treatment/Interventions Self-care/ADL training;Therapeutic exercise;Manual Therapy;Coping strategies training;Therapeutic activities;Manual lymph drainage;DME and/or AE instruction;Compression bandaging;Other (comment);Patient/family education   skin care with low ph castor oil and/ or eucerin lotion   Plan Intensive and Self-Management Phases of Complete Decongestive Therapy (CDT) to include manual lymphatic drainage, skin care, compression wraps and garments, therapeutic exercise, caregiver training    Recommended Other Services fit with BLE, knee length, custom compression garments and HOS devices needed to limit worsening fibrosis formation during HOS. Consider fitting with advanced, sequential , pneumatic compression device, or Flexitouch "pump" medically necessary for optimal level of independence with LE self-management at home over time    Consulted and Agree with Plan of Care Patient;Family member/caregiver             Patient will benefit from skilled therapeutic intervention in order to improve the following deficits and impairments:   Body Structure / Function / Physical Skills: ADL, Edema, Skin integrity, Pain, Decreased knowledge of precautions, Decreased knowledge of use of DME, IADL       Visit Diagnosis: Lymphedema, not elsewhere classified    Problem List Patient Active Problem List   Diagnosis Date Noted   Ulcers of both lower legs (HCC) 09/09/2019   Lymphedema of both lower extremities 09/09/2019   Sinus tachycardia 08/06/2019   Chronic diastolic heart failure (HCC)    Benign prostatic hyperplasia with lower urinary tract symptoms 07/02/2019   Snoring 07/02/2019   Cellulitis of left leg 05/15/2019   Left leg cellulitis 05/11/2019   Swelling  abdomen 05/11/2019   Thiamine deficiency 12/28/2018   Pneumonia 03/28/2018   Subacute delirium 03/23/2018   Difficulty urinating 02/03/2018   Peripheral neuropathy 05/07/2017   Folate deficiency 05/07/2017   Pleural effusion, left 04/12/2016   Peripheral edema 02/29/2016   Hoarseness  of voice 11/23/2015   Essential hypertension, benign 10/20/2015   Schizoaffective disorder, depressive type (HCC) 02/17/2015   Vitamin D deficiency 02/14/2015   Low back pain 10/27/2014   Dyspnea on exertion 07/19/2014   Acute upper back pain 05/21/2013   Psychotic mood disorder (HCC) 05/14/2012   History of DVT (deep vein thrombosis) 07/06/2010   History of patellar fracture 07/06/2010   HYPERCHOLESTEROLEMIA 10/13/2009   PREDIABETES 10/13/2009   Anxiety state 06/02/2006   TOBACCO ABUSE 06/02/2006   Chronic back pain 06/02/2006   Jeffrey Dubonnet, MS, OTR/L, CLT-LANA 08/30/20 11:40 AM    [facility] MAIN [facility] SERVICES [address] [place], [place], [address] Phone: [phone]   Fax:  [phone]  Name: Jeffrey Lin MRN: [id number] Date of Birth: 1975/02/02

## 2020-08-31 ENCOUNTER — Ambulatory Visit (INDEPENDENT_AMBULATORY_CARE_PROVIDER_SITE_OTHER): Payer: Medicare Other | Admitting: Podiatry

## 2020-08-31 DIAGNOSIS — B351 Tinea unguium: Secondary | ICD-10-CM | POA: Diagnosis not present

## 2020-08-31 DIAGNOSIS — M79674 Pain in right toe(s): Secondary | ICD-10-CM

## 2020-08-31 DIAGNOSIS — M79675 Pain in left toe(s): Secondary | ICD-10-CM | POA: Diagnosis not present

## 2020-09-01 ENCOUNTER — Ambulatory Visit: Payer: Medicare Other | Admitting: Occupational Therapy

## 2020-09-01 ENCOUNTER — Other Ambulatory Visit: Payer: Self-pay

## 2020-09-01 DIAGNOSIS — I890 Lymphedema, not elsewhere classified: Secondary | ICD-10-CM | POA: Diagnosis not present

## 2020-09-01 NOTE — Patient Instructions (Signed)
Lymphedema Self- Care Instructions   1. EXERCISE: Perform lymphatic pumping there ex 2 x a day. While wearing your compression wraps or garments. Perform 10 reps of each exercise bilaterally and be sure to perform them in order. Don;t skip around!  OMIT PARTIAL SIT UPs.  2. MLD: Perform simple self-Manual Lymphatic Drainage (MLD) at least once a day as directed.  3. If you have a Flexitouch advanced "pump" use it 1 time each day on a single limb only. The Flexitouch moves lymphatic fluid out of your affected body part and back to your heart, so DO NOT use the Flexi on 2 legs at a time, and DO NOT ues it on 2 legs on the same day. If you experience any atypical shortness of breath, sudden onset of pain, or feelings of heart arhythmia, or racing, discontinue use of the Flexitouch and report these symptoms to your doctor right away.  4. WRAPS: Compression wraps are to be worn 23 hrs/ 7 days/wk during Intensive Phase of Complete Decongestive Therapy (CDT).Building tolerance may take time and practice, so don't get discouraged. If bandages begin to feel tight during periods of inactivity and/or during the night, try performing your exercises to loosen them.   5. Daytime GARMENTS/ HOS DEVICES: During Management Phase CDT your compression garments are to be worn during waking hours when active. Do NOT sleep in your garments!!  Don daytime garments first thing in the morning. Do not wear your HOS devices all day instead of garments. These are not made to be tight and will not contain your swelling like your stockings will.  6. PUT YOUR FEET UP! Elevate your feet and legs and feet to the level of your heart whenever you are sitting down.   7. SKIN: Carefully monitor skin condition and perform impeccable hygiene daily. Bathe skin with mild soap and water and apply low pH lotion (aka Eucerin ) to improve hydration and limit infection risk. C

## 2020-09-01 NOTE — Therapy (Signed)
[facility] MAIN [facility] SERVICES [address] [place], [place], [address] Phone: [phone]   Fax:  [phone]  Occupational Therapy Treatment  Patient Details  Name: Jeffrey Lin MRN: [id number] Date of Birth: October 09, 1975 Referring Provider (OT): Allen Derry, [place]   Encounter Date: 09/01/2020   OT End of Session - 09/01/20 1005     Visit Number 3    Number of Visits 36    Date for OT Re-Evaluation 11/21/20    OT Start Time 1000    OT Stop Time 1100    OT Time Calculation (min) 60 min    Activity Tolerance Patient tolerated treatment well;No increased pain    Behavior During Therapy Flat affect;WFL for tasks assessed/performed             Past Medical History:  Diagnosis Date   Anxiety    Chronic leg pain    Depression    Essential hypertension, benign 10/20/2015   Schizoaffective disorder [facility])     Past Surgical History:  Procedure Laterality Date   Block procedure at pain center  03/27/06   Bone graft from hip to jaw  2004   Bone graft right side of head to jaw  2006   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   Subjective Assessment - 09/01/20 1006     Subjective  Jeffrey Lin presents for OT  treatment visit 2/ 36 to address BLE lymphedema. He is accompanied byhis mother, Carney Bern, today. [name] reports leg pan of 7/10. He denies other concerns this morning.    Patient is accompanied by: Family member    Pertinent History H&P relevant to BLE lymphedema:   tobacco abuse, HTN, hx BLE ulcers, hx cellulitis, Hx DVT, Hx patellar fx, chronic BLE pain, chronic BLE lymphedema, neuropathy, Hx pneumonia, snoring. chronic diastolic  heart failure, sinus tachycardia, chronic back pain, psychotic mood disorder/ Schitzoaffective disorder- depressive type, snoring    Limitations chronic leg pain and swelling, decreased balance,    Repetition Increases Symptoms    Special Tests Intake FOTO: 45/100    Patient Stated Goals get pain and swelling down in my legs    Pain Onset More than a month ago   approximately 4 years ago   Pain Onset More than a month ago                                  OT Education - 09/01/20 1109     Education Details Continued skilled Pt/caregiver education  And LE ADL training throughout visit for lymphedema self care/ home program, including compression wrapping, compression garment and device wear/care, lymphatic pumping ther ex, simple self-MLD, and skin care. Discussed initial volumetric measurements. Discussed all long term goals.    Person(s) Educated Patient;Parent(s)    Methods Explanation;Demonstration;Handout    Comprehension Verbalized understanding;Returned demonstration;Need further instruction                 OT Long Term Goals - 08/24/20 1206       OT LONG TERM GOAL #1   Title Patient's intake functional score on the  FOTO tool is 45 out of 100 (higher number = greater function). Given this patient's comorbidities she will experience at least an increase in function of 4 points, or higher, to increase level of independence with basic ADLs and functional ambulation and mobility.    Baseline 45/100    Time 12    Period Weeks    Status New    Target Date 11/22/20      OT LONG TERM GOAL #2   Title Pt will be able to apply multi-layer, short stretch compression wraps to one leg at a time using correct gradient techniques with maximum caregiver assistance in an effort  to return the affected  limb(s)  to premorbid size and shape, to limit pain and infection risk, and to improve functional mobility and ambulation  for ADLs.    Baseline dependent    Time 4     Period Days    Status New    Target Date --   4th OR Rx visit\     OT LONG TERM GOAL #3   Title Pt will achieve at least 10% limb volume reduction Bilaterally during Intensive Phase CDT to prevent re-accumulation of lymphatic congestion and progression of fibrosis, to limit infection risk, to improve functional ambulation and transfers, and to promote wound healing.    Baseline dependent    Time 12    Period Weeks    Status New    Target Date 11/22/20      OT LONG TERM GOAL #4   Title With max caregiver assistance Pt will achieve and sustain a least 85% compliance with all LE self-care home program components throughout Intensive Phase CDT, including elevation when seated, daily skin inspection and care, lymphatic pumping ther ex, 23/7 compression wraps and simple self-MLD. Max caregiver assistance is necessary to ensure optimal limb volume reduction, to limit infection risk and to limit LE progression. Pt will arrange for assistance before we begin CDT.    Baseline dependent    Time 12    Period Weeks    Status New    Target Date 11/22/20      OT LONG TERM GOAL #5   Title Using assistive devices (modified independence) Pt will be able to don and doff appropriate daytime compression garments to limit lymphatic re-accumulation and LE progression before transitioning to self-management phase of CDT.Using assistive devices (modified independence) Pt will be able to don and doff appropriate daytime compression garments to limit lymphatic re-accumulation and LE progression before transitioning to self-management phase of CDT.    Baseline Max A    Time 12    Period Weeks    Status New    Target Date 11/22/20                   Plan - 09/01/20 1106     Clinical Impression Statement Commenced MLD to LLQ/LLE utilizing ipsilateral functional inguinal watershed and deep abdominal lymphatics. Pt tolerated treatment without increased pain. Redness reduced after 1 hour of elevation  during manual session. Performed continuous skin care with Eucerin low ph lotion during MLD to increase skin mobility and hydration. Induration is dense and deep. Re-applied compression wraps as estanlished and reviewed wrap techniques w patient and his mom. Cont as per POC.    OT Occupational Profile and History Comprehensive Assessment- Review of records and extensive additional review of physical, cognitive, psychosocial history related to current functional performance    Occupational performance deficits (Please refer to evaluation for details): IADL's;ADL's;Rest  and Sleep;Work;Leisure;Social Participation   w max CG assist for home program   Body Structure / Function / Physical Skills ADL;Edema;Skin integrity;Pain;Decreased knowledge of precautions;Decreased knowledge of use of DME;IADL    Rehab Potential Good    Clinical Decision Making Several treatment options, min-mod task modification necessary    Comorbidities Affecting Occupational Performance: Presence of comorbidities impacting occupational performance    Comorbidities impacting occupational performance description: suspected CVI, hx cellulitis, hx dvt    Modification or Assistance to Complete Evaluation  Min-Moderate modification of tasks or assist with assess necessary to complete eval    OT Frequency 2x / week    OT Duration 12 weeks   and PRN   OT Treatment/Interventions Self-care/ADL training;Therapeutic exercise;Manual Therapy;Coping strategies training;Therapeutic activities;Manual lymph drainage;DME and/or AE instruction;Compression bandaging;Other (comment);Patient/family education   skin care with low ph castor oil and/ or eucerin lotion   Plan Intensive and Self-Management Phases of Complete Decongestive Therapy (CDT) to include manual lymphatic drainage, skin care, compression wraps and garments, therapeutic exercise, caregiver training    Recommended Other Services fit with BLE, knee length, custom compression garments and  HOS devices needed to limit worsening fibrosis formation during HOS. Consider fitting with advanced, sequential , pneumatic compression device, or Flexitouch "pump" medically necessary for optimal level of independence with LE self-management at home over time    Consulted and Agree with Plan of Care Patient;Family member/caregiver             Patient will benefit from skilled therapeutic intervention in order to improve the following deficits and impairments:   Body Structure / Function / Physical Skills: ADL, Edema, Skin integrity, Pain, Decreased knowledge of precautions, Decreased knowledge of use of DME, IADL       Visit Diagnosis: Lymphedema, not elsewhere classified    Problem List Patient Active Problem List   Diagnosis Date Noted   Ulcers of both lower legs (HCC) 09/09/2019   Lymphedema of both lower extremities 09/09/2019   Sinus tachycardia 08/06/2019   Chronic diastolic heart failure (HCC)    Benign prostatic hyperplasia with lower urinary tract symptoms 07/02/2019   Snoring 07/02/2019   Cellulitis of left leg 05/15/2019   Left leg cellulitis 05/11/2019   Swelling abdomen 05/11/2019   Thiamine deficiency 12/28/2018   Pneumonia 03/28/2018   Subacute delirium 03/23/2018   Difficulty urinating 02/03/2018   Peripheral neuropathy 05/07/2017   Folate deficiency 05/07/2017   Pleural effusion, left 04/12/2016   Peripheral edema 02/29/2016   Hoarseness of voice 11/23/2015   Essential hypertension, benign 10/20/2015   Schizoaffective disorder, depressive type (HCC) 02/17/2015   Vitamin D deficiency 02/14/2015   Low back pain 10/27/2014   Dyspnea on exertion 07/19/2014   Acute upper back pain 05/21/2013   Psychotic mood disorder (HCC) 05/14/2012   History of DVT (deep vein thrombosis) 07/06/2010   History of patellar fracture 07/06/2010   HYPERCHOLESTEROLEMIA 10/13/2009   PREDIABETES 10/13/2009   Anxiety state 06/02/2006   TOBACCO ABUSE 06/02/2006   Chronic back  pain 06/02/2006   Loel Dubonnet, MS, OTR/L, [facility] 09/01/20 11:09 AM   [facility] MAIN [facility] SERVICES [address] [place], [place], [address] Phone: [phone]   Fax:  [phone]  Name: RATHANA VIVEROS MRN: [id number] Date of Birth: [birth year]

## 2020-09-04 NOTE — Progress Notes (Signed)
  Subjective:  Patient ID: Jeffrey Lin, male    DOB: 10-19-1975,  MRN: [id number]  Chief Complaint  Patient presents with   Nail Problem       right great toenail     45 y.o. male presents with the above complaint. History confirmed with patient.  He is not been able to cut his toenails with his significant lymphedema  Objective:  Physical Exam: warm, good capillary refill, DP reduced bilateral, no trophic changes or ulcerative lesions, normal sensory exam and PT reduced bilateral. Severe +3 diffuse pitting edema throughout the bilateral LE. Pulses are weakly palpable, secondary to edema. Cellulitis around ankle, the leg is dressed and wrapped.  Nails are thickened elongated yellow dystrophic and have subungual debris   Assessment:   1. Pain due to onychomycosis of toenails of both feet      Plan:  Patient was evaluated and treated and all questions answered.  Discussed the etiology and treatment options for the condition in detail with the patient. Educated patient on the topical and oral treatment options for mycotic nails. Recommended debridement of the nails today. Sharp and mechanical debridement performed of all painful and mycotic nails today. Nails debrided in length and thickness using a nail nipper to level of comfort. Discussed treatment options including appropriate shoe gear. Follow up as needed for painful nails.     Return if symptoms worsen or fail to improve.

## 2020-09-06 ENCOUNTER — Ambulatory Visit: Payer: Medicare Other | Admitting: Occupational Therapy

## 2020-09-06 ENCOUNTER — Other Ambulatory Visit: Payer: Self-pay

## 2020-09-06 DIAGNOSIS — I890 Lymphedema, not elsewhere classified: Secondary | ICD-10-CM | POA: Diagnosis not present

## 2020-09-07 NOTE — Patient Instructions (Signed)

## 2020-09-07 NOTE — Therapy (Signed)
[facility] MAIN [facility] [address] [place], [place], [address] Phone: [phone]   Fax:  [phone]  Occupational Therapy Treatment  Patient Details  Name: Jeffrey Lin MRN: [id number] Date of Birth: 04/01/1975 Referring Provider (OT): Allen DerryHoyt Stone, [place]   Encounter Date: 09/06/2020   OT End of Session - 09/06/20 1020     Visit Number 5    Number of Visits 36    Date for OT Re-Evaluation 11/21/20    OT Start Time 1000    OT Stop Time 1110    OT Time Calculation (min) 70 min    Activity Tolerance Patient tolerated treatment well;No increased pain    Behavior [facility] for tasks assessed/performed             Past Medical History:  Diagnosis Date   Anxiety    Chronic leg pain    Depression    Essential hypertension, benign 10/20/2015   Schizoaffective disorder [facility](HCC)     Past Surgical History:  Procedure Laterality Date   Block procedure at pain center  03/27/06   Bone graft from hip to jaw  2004   Bone graft right side of head to jaw  2006   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   Subjective Assessment - 09/06/20 1021     Subjective  Jeffrey Lin presents for OT  treatment visit 4/ 36 to address BLE lymphedema. He is accompanied by his mother, [name], today. [name] reports leg pan of 6/10. Pt reports his leg feels "uncomfortable and itches".    Patient is accompanied by: Family member    Pertinent History H&P relevant to BLE lymphedema:   tobacco abuse, HTN, hx BLE ulcers, hx cellulitis, Hx DVT, Hx patellar fx, chronic BLE pain, chronic BLE lymphedema, neuropathy, Hx pneumonia, snoring. chronic  diastolic heart failure, sinus tachycardia, chronic back pain, psychotic mood disorder/ Schitzoaffective disorder- depressive type, snoring    Limitations chronic leg pain and swelling, decreased balance,    Repetition Increases Symptoms    Special Tests Intake FOTO: 45/100    Patient Stated Goals get pain and swelling down in my legs    Pain Onset More than a month ago   approximately 4 years ago   Pain Onset More than a month ago                          OT Treatments/Exercises (OP) - 09/06/20 1106       ADLs   ADL Education Given Yes      Manual Therapy   Manual Therapy Edema management;Compression Bandaging    Manual therapy comments Initial.BLE comparative limb volumetrics    Compression Bandaging Multilayer compression wraps from base of toes to below the knee using 8. 10 and 12 cm short stretch bandages   layered over 0.4 cm thick Rosidal foam and cotton stockinett.                    OT Education - 09/07/20 0920     Education Details Continued skilled Pt/caregiver education  And LE ADL training throughout visit for lymphedema  self care/ home program, including compression wrapping, compression garment and device wear/care, lymphatic pumping ther ex, simple self-MLD, and skin care. Discussed initial volumetric measurements. Discussed all long term goals.    Person(s) Educated Patient;Parent(s)    Methods Explanation;Demonstration;Handout    Comprehension Verbalized understanding;Returned demonstration;Need further instruction                 OT Long Term Goals - 08/24/20 1206       OT LONG TERM GOAL #1   Title Patient's intake functional score on the FOTO tool is 45 out of 100 (higher number = greater function). Given this patient's comorbidities she will experience at least an increase in function of 4 points, or higher, to increase level of independence with basic ADLs and functional ambulation and mobility.    Baseline 45/100    Time 12     Period Weeks    Status New    Target Date 11/22/20      OT LONG TERM GOAL #2   Title Pt will be able to apply multi-layer, short stretch compression wraps to one leg at a time using correct gradient techniques with maximum caregiver assistance in an effort  to return the affected  limb(s)  to premorbid size and shape, to limit pain and infection risk, and to improve functional mobility and ambulation  for ADLs.    Baseline dependent    Time 4    Period Days    Status New    Target Date --   4th OR Rx visit\     OT LONG TERM GOAL #3   Title Pt will achieve at least 10% limb volume reduction Bilaterally during Intensive Phase CDT to prevent re-accumulation of lymphatic congestion and progression of fibrosis, to limit infection risk, to improve functional ambulation and transfers, and to promote wound healing.    Baseline dependent    Time 12    Period Weeks    Status New    Target Date 11/22/20      OT LONG TERM GOAL #4   Title With max caregiver assistance Pt will achieve and sustain a least 85% compliance with all LE self-care home program components throughout Intensive Phase CDT, including elevation when seated, daily skin inspection and care, lymphatic pumping ther ex, 23/7 compression wraps and simple self-MLD. Max caregiver assistance is necessary to ensure optimal limb volume reduction, to limit infection risk and to limit LE progression. Pt will arrange for assistance before we begin CDT.    Baseline dependent    Time 12    Period Weeks    Status New    Target Date 11/22/20      OT LONG TERM GOAL #5   Title Using assistive devices (modified independence) Pt will be able to don and doff appropriate daytime compression garments to limit lymphatic re-accumulation and LE progression before transitioning to self-management phase of CDT.Using assistive devices (modified independence) Pt will be able to don and doff appropriate daytime compression garments to limit lymphatic  re-accumulation and LE progression before transitioning to self-management phase of CDT.    Baseline Max A    Time 12    Period Weeks    Status New    Target Date 11/22/20                   Plan - 09/06/20 1104     Clinical Impression Statement LLE limb volume below the knee is clearly responding to CDT as evidenced by reduction in  edema and decrease in palpable tissue density. Pt is wrapping himself between visits and doing quite well. His mom is still a bit reluctant to learn to wrap. If Pt takes over this activity then prognosis for greater independence w long-term LE self care is better. Continued MLD, skin care and reapplied wraps after manual therapy. Pt reminded that skin remains quite dry and rough and requires diligent attention daily. Cont as per POC.    OT Occupational Profile and History Comprehensive Assessment- Review of records and extensive additional review of physical, cognitive, psychosocial history related to current functional performance    Occupational performance deficits (Please refer to evaluation for details): IADL's;ADL's;Rest and Sleep;Work;Leisure;Social Participation   w max CG assist for home program   Body Structure / Function / Physical Skills ADL;Edema;Skin integrity;Pain;Decreased knowledge of precautions;Decreased knowledge of use of DME;IADL    Rehab Potential Good    Clinical Decision Making Several treatment options, min-mod task modification necessary    Comorbidities Affecting Occupational Performance: Presence of comorbidities impacting occupational performance    Comorbidities impacting occupational performance description: suspected CVI, hx cellulitis, hx dvt    Modification or Assistance to Complete Evaluation  Min-Moderate modification of tasks or assist with assess necessary to complete eval    OT Frequency 2x / week    OT Duration 12 weeks   and PRN   OT Treatment/Interventions Self-care/ADL training;Therapeutic exercise;Manual  Therapy;Coping strategies training;Therapeutic activities;Manual lymph drainage;DME and/or AE instruction;Compression bandaging;Other (comment);Patient/family education   skin care with low ph castor oil and/ or eucerin lotion   Plan Intensive and Self-Management Phases of Complete Decongestive Therapy (CDT) to include manual lymphatic drainage, skin care, compression wraps and garments, therapeutic exercise, caregiver training    Recommended Other Services fit with BLE, knee length, custom compression garments and HOS devices needed to limit worsening fibrosis formation during HOS. Consider fitting with advanced, sequential , pneumatic compression device, or Flexitouch "pump" medically necessary for optimal level of independence with LE self-management at home over time    Consulted and Agree with Plan of Care Patient;Family member/caregiver             Patient will benefit from skilled therapeutic intervention in order to improve the following deficits and impairments:   Body Structure / Function / Physical Skills: ADL, Edema, Skin integrity, Pain, Decreased knowledge of precautions, Decreased knowledge of use of DME, IADL       Visit Diagnosis: Lymphedema, not elsewhere classified    Problem List Patient Active Problem List   Diagnosis Date Noted   Ulcers of both lower legs (HCC) 09/09/2019   Lymphedema of both lower extremities 09/09/2019   Sinus tachycardia 08/06/2019   Chronic diastolic heart failure (HCC)    Benign prostatic hyperplasia with lower urinary tract symptoms 07/02/2019   Snoring 07/02/2019   Cellulitis of left leg 05/15/2019   Left leg cellulitis 05/11/2019   Swelling abdomen 05/11/2019   Thiamine deficiency 12/28/2018   Pneumonia 03/28/2018   Subacute delirium 03/23/2018   Difficulty urinating 02/03/2018   Peripheral neuropathy 05/07/2017   Folate deficiency 05/07/2017   Pleural effusion, left 04/12/2016   Peripheral edema 02/29/2016   Hoarseness of voice  11/23/2015   Essential hypertension, benign 10/20/2015   Schizoaffective disorder, depressive type (HCC) 02/17/2015   Vitamin D deficiency 02/14/2015   Low back pain 10/27/2014   Dyspnea on exertion 07/19/2014   Acute upper back pain 05/21/2013   Psychotic mood disorder (HCC) 05/14/2012   History of DVT (deep vein thrombosis) 07/06/2010  History of patellar fracture 07/06/2010   HYPERCHOLESTEROLEMIA 10/13/2009   PREDIABETES 10/13/2009   Anxiety state 06/02/2006   TOBACCO ABUSE 06/02/2006   Chronic back pain 06/02/2006    Loel Dubonnet, MS, OTR/L, [facility] 09/07/20 9:21 AM    [place] [facility] MAIN [facility] SERVICES [address] [place], [place], [address] Phone: [phone]   Fax:  [phone]  Name: Jeffrey Lin MRN: [id number] Date of Birth: 10/09/1975

## 2020-09-08 ENCOUNTER — Other Ambulatory Visit: Payer: Self-pay

## 2020-09-08 ENCOUNTER — Ambulatory Visit: Payer: Medicare Other | Admitting: Occupational Therapy

## 2020-09-08 DIAGNOSIS — I890 Lymphedema, not elsewhere classified: Secondary | ICD-10-CM | POA: Diagnosis not present

## 2020-09-08 NOTE — Patient Instructions (Signed)

## 2020-09-08 NOTE — Therapy (Signed)
[place] [facility] MAIN [facility] SERVICES [address] [place], [place], [address] Phone: [phone]   Fax:  [phone]  Occupational Therapy Treatment  Patient Details  Name: FREDIS MALKIEWICZ MRN: [id number] Date of Birth: 07-Feb-1975 Referring Provider (OT): Allen Derry, [place]   Encounter Date: 09/08/2020   OT End of Session - 09/08/20 1001     Visit Number 5    Number of Visits 36    Date for OT Re-Evaluation 11/21/20    OT Start Time 1001    OT Stop Time 1101    OT Time Calculation (min) 60 min    Activity Tolerance Patient tolerated treatment well;No increased pain    Behavior During Therapy [facility] for tasks assessed/performed             Past Medical History:  Diagnosis Date   Anxiety    Chronic leg pain    Depression    Essential hypertension, benign 10/20/2015   Schizoaffective disorder [facility])     Past Surgical History:  Procedure Laterality Date   Block procedure at pain center  03/27/06   Bone graft from hip to jaw  2004   Bone graft right side of head to jaw  2006   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   Subjective Assessment - 09/08/20 1007     Subjective  Lendon Colonel presents for OT  treatment visit 5/ 36 to address BLE lymphedema. He is accompanied by his mother, Carney Bern, today. Mr. Thall reports leg pan of 5/10. Pt has no new concerns. He continues to wrap his leg with extra time between visits by report.    Patient is accompanied by: Family member    Pertinent History H&P relevant to BLE lymphedema:   tobacco abuse, HTN, hx BLE ulcers, hx cellulitis, Hx DVT, Hx patellar fx, chronic BLE pain, chronic BLE lymphedema,  neuropathy, Hx pneumonia, snoring. chronic diastolic heart failure, sinus tachycardia, chronic back pain, psychotic mood disorder/ Schitzoaffective disorder- depressive type, snoring    Limitations chronic leg pain and swelling, decreased balance,    Repetition Increases Symptoms    Special Tests Intake FOTO: 45/100    Patient Stated Goals get pain and swelling down in my legs    Pain Onset More than a month ago   approximately 4 years ago   Pain Onset More than a month ago                          OT Treatments/Exercises (OP) - 09/08/20 1227       ADLs   ADL Education Given Yes      Manual Therapy   Manual Therapy Edema management;Compression Bandaging    Manual therapy comments Initial.BLE comparative limb volumetrics    Compression Bandaging Multilayer compression wraps from base of toes to below the knee using 8. 10 and 12 cm short stretch bandages   layered over 0.4 cm thick Rosidal foam and cotton stockinett.                    OT Education - 09/08/20 1227     Education Details Continued skilled Pt/caregiver  education  And LE ADL training throughout visit for lymphedema self care/ home program, including compression wrapping, compression garment and device wear/care, lymphatic pumping ther ex, simple self-MLD, and skin care. Discussed initial volumetric measurements. Discussed all long term goals.    Person(s) Educated Patient;Parent(s)    Methods Explanation;Demonstration;Handout    Comprehension Verbalized understanding;Returned demonstration;Need further instruction                 OT Long Term Goals - 08/24/20 1206       OT LONG TERM GOAL #1   Title Patient's intake functional score on the FOTO tool is 45 out of 100 (higher number = greater function). Given this patient's comorbidities she will experience at least an increase in function of 4 points, or higher, to increase level of independence with basic ADLs and functional ambulation  and mobility.    Baseline 45/100    Time 12    Period Weeks    Status New    Target Date 11/22/20      OT LONG TERM GOAL #2   Title Pt will be able to apply multi-layer, short stretch compression wraps to one leg at a time using correct gradient techniques with maximum caregiver assistance in an effort  to return the affected  limb(s)  to premorbid size and shape, to limit pain and infection risk, and to improve functional mobility and ambulation  for ADLs.    Baseline dependent    Time 4    Period Days    Status New    Target Date --   4th OR Rx visit\     OT LONG TERM GOAL #3   Title Pt will achieve at least 10% limb volume reduction Bilaterally during Intensive Phase CDT to prevent re-accumulation of lymphatic congestion and progression of fibrosis, to limit infection risk, to improve functional ambulation and transfers, and to promote wound healing.    Baseline dependent    Time 12    Period Weeks    Status New    Target Date 11/22/20      OT LONG TERM GOAL #4   Title With max caregiver assistance Pt will achieve and sustain a least 85% compliance with all LE self-care home program components throughout Intensive Phase CDT, including elevation when seated, daily skin inspection and care, lymphatic pumping ther ex, 23/7 compression wraps and simple self-MLD. Max caregiver assistance is necessary to ensure optimal limb volume reduction, to limit infection risk and to limit LE progression. Pt will arrange for assistance before we begin CDT.    Baseline dependent    Time 12    Period Weeks    Status New    Target Date 11/22/20      OT LONG TERM GOAL #5   Title Using assistive devices (modified independence) Pt will be able to don and doff appropriate daytime compression garments to limit lymphatic re-accumulation and LE progression before transitioning to self-management phase of CDT.Using assistive devices (modified independence) Pt will be able to don and doff appropriate daytime  compression garments to limit lymphatic re-accumulation and LE progression before transitioning to self-management phase of CDT.    Baseline Max A    Time 12    Period Weeks    Status New    Target Date 11/22/20                   Plan - 09/08/20 1225     Clinical Impression Statement Continued MLD, skin care and compression  therapy to LLE today. Pt conmtinues to tolerate all CDT components and remains > 85 % compliant with home program with mother's assistance. Cont as per OPC.  Cont as per POC.    OT Occupational Profile and History Comprehensive Assessment- Review of records and extensive additional review of physical, cognitive, psychosocial history related to current functional performance    Occupational performance deficits (Please refer to evaluation for details): IADL's;ADL's;Rest and Sleep;Work;Leisure;Social Participation   w max CG assist for home program   Body Structure / Function / Physical Skills ADL;Edema;Skin integrity;Pain;Decreased knowledge of precautions;Decreased knowledge of use of DME;IADL    Rehab Potential Good    Clinical Decision Making Several treatment options, min-mod task modification necessary    Comorbidities Affecting Occupational Performance: Presence of comorbidities impacting occupational performance    Comorbidities impacting occupational performance description: suspected CVI, hx cellulitis, hx dvt    Modification or Assistance to Complete Evaluation  Min-Moderate modification of tasks or assist with assess necessary to complete eval    OT Frequency 2x / week    OT Duration 12 weeks   and PRN   OT Treatment/Interventions Self-care/ADL training;Therapeutic exercise;Manual Therapy;Coping strategies training;Therapeutic activities;Manual lymph drainage;DME and/or AE instruction;Compression bandaging;Other (comment);Patient/family education   skin care with low ph castor oil and/ or eucerin lotion   Plan Intensive and Self-Management Phases of  Complete Decongestive Therapy (CDT) to include manual lymphatic drainage, skin care, compression wraps and garments, therapeutic exercise, caregiver training    Recommended Other Services fit with BLE, knee length, custom compression garments and HOS devices needed to limit worsening fibrosis formation during HOS. Consider fitting with advanced, sequential , pneumatic compression device, or Flexitouch "pump" medically necessary for optimal level of independence with LE self-management at home over time    Consulted and Agree with Plan of Care Patient;Family member/caregiver             Patient will benefit from skilled therapeutic intervention in order to improve the following deficits and impairments:   Body Structure / Function / Physical Skills: ADL, Edema, Skin integrity, Pain, Decreased knowledge of precautions, Decreased knowledge of use of DME, IADL       Visit Diagnosis: Lymphedema, not elsewhere classified    Problem List Patient Active Problem List   Diagnosis Date Noted   Ulcers of both lower legs (HCC) 09/09/2019   Lymphedema of both lower extremities 09/09/2019   Sinus tachycardia 08/06/2019   Chronic diastolic heart failure (HCC)    Benign prostatic hyperplasia with lower urinary tract symptoms 07/02/2019   Snoring 07/02/2019   Cellulitis of left leg 05/15/2019   Left leg cellulitis 05/11/2019   Swelling abdomen 05/11/2019   Thiamine deficiency 12/28/2018   Pneumonia 03/28/2018   Subacute delirium 03/23/2018   Difficulty urinating 02/03/2018   Peripheral neuropathy 05/07/2017   Folate deficiency 05/07/2017   Pleural effusion, left 04/12/2016   Peripheral edema 02/29/2016   Hoarseness of voice 11/23/2015   Essential hypertension, benign 10/20/2015   Schizoaffective disorder, depressive type (HCC) 02/17/2015   Vitamin D deficiency 02/14/2015   Low back pain 10/27/2014   Dyspnea on exertion 07/19/2014   Acute upper back pain 05/21/2013   Psychotic mood  disorder (HCC) 05/14/2012   History of DVT (deep vein thrombosis) 07/06/2010   History of patellar fracture 07/06/2010   HYPERCHOLESTEROLEMIA 10/13/2009   PREDIABETES 10/13/2009   Anxiety state 06/02/2006   TOBACCO ABUSE 06/02/2006   Chronic back pain 06/02/2006   Loel Dubonnet, MS, OTR/L, CLT-LANA 09/08/20 12:29 PM  [facility] MAIN [facility] SERVICES [address] [place], [place], [address] Phone: [phone]   Fax:  [phone]  Name: JANARD CULP MRN: [id number] Date of Birth: [birth year]

## 2020-09-13 ENCOUNTER — Other Ambulatory Visit: Payer: Self-pay

## 2020-09-13 ENCOUNTER — Ambulatory Visit: Payer: Medicare Other | Admitting: Occupational Therapy

## 2020-09-13 DIAGNOSIS — I890 Lymphedema, not elsewhere classified: Secondary | ICD-10-CM

## 2020-09-13 NOTE — Patient Instructions (Signed)

## 2020-09-13 NOTE — Therapy (Signed)
[facility] [facility] [address] [place], [place], [address] Phone: [phone]   Fax:  [phone]  Occupational Therapy Treatment  Patient Details  Name: Jeffrey Lin MRN: [id number] Date of Birth: 04/19/1975 Referring Provider (OT): Allen DerryHoyt Stone, [place]   Encounter Date: 09/13/2020   OT End of Session - 09/13/20 1410     Visit Number 6    Number of Visits 36    Date for OT Re-Evaluation 11/21/20    OT Start Time 1015    OT Stop Time 1115    OT Time Calculation (min) 60 min    Activity Tolerance Patient tolerated treatment well;No increased pain    Behavior 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   Subjective Assessment - 09/13/20 1414     Subjective  Jeffrey Lin presents for OT  treatment visit 6/ 36 to address BLE lymphedema. He is un accompanied by his mother today. [name] reports leg pan of 5/10. Pt has no new concerns. He continues to wrap his leg and perform daily skin care bilaterally as directed by report.    Patient is accompanied by: Family member    Pertinent History H&P relevant to BLE lymphedema:   tobacco abuse, HTN, hx BLE ulcers, hx cellulitis, Hx DVT, Hx patellar fx, chronic BLE pain, chronic BLE  lymphedema, neuropathy, Hx pneumonia, snoring. chronic diastolic heart failure, sinus tachycardia, chronic back pain, psychotic mood disorder/ Schitzoaffective disorder- depressive type, snoring    Limitations chronic leg pain and swelling, decreased balance,    Repetition Increases Symptoms    Special Tests Intake FOTO: 45/100    Patient Stated Goals get pain and swelling down in my legs    Pain Onset More than a month ago   approximately 4 years ago   Pain Onset More than a month ago                          OT Treatments/Exercises (OP) - 09/13/20 1416       ADLs   ADL Education Given Yes      Manual Therapy   Manual Therapy Edema management;Compression Bandaging;Manual Lymphatic Drainage (MLD)    Manual Lymphatic Drainage (MLD) MLD as established utilizing short neck sequence, deep abdominal breathing, functional inguinal LN, and proximal to distal J strokes and fibrosis techniques  from thigh to toes. Performed simultaneous skin care throughout MLD with low ph castor oil    Compression Bandaging Multilayer compression wraps from base of toes to below the knee using 8. 10 and 12 cm short stretch bandages   layered over  0.4 cm thick Rosidal foam and cotton stockinett.                    OT Education - 09/13/20 1426     Education Details Continued skilled Pt/caregiver education  And LE ADL training throughout visit for lymphedema self care/ home program, including compression wrapping, compression garment and device wear/care, lymphatic pumping ther ex, simple self-MLD, and skin care. Discussed initial volumetric measurements. Discussed all long term goals.    Person(s) Educated Patient;Parent(s)    Methods Explanation;Demonstration;Handout    Comprehension Verbalized understanding;Returned demonstration;Need further instruction                 OT Long Term Goals - 08/24/20 1206       OT LONG TERM GOAL #1   Title Patient's intake functional score  on the FOTO tool is 45 out of 100 (higher number = greater function). Given this patient's comorbidities she will experience at least an increase in function of 4 points, or higher, to increase level of independence with basic ADLs and functional ambulation and mobility.    Baseline 45/100    Time 12    Period Weeks    Status New    Target Date 11/22/20      OT LONG TERM GOAL #2   Title Pt will be able to apply multi-layer, short stretch compression wraps to one leg at a time using correct gradient techniques with maximum caregiver assistance in an effort  to return the affected  limb(s)  to premorbid size and shape, to limit pain and infection risk, and to improve functional mobility and ambulation  for ADLs.    Baseline dependent    Time 4    Period Days    Status New    Target Date --   4th OR Rx visit\     OT LONG TERM GOAL #3   Title Pt will achieve at least 10% limb volume reduction Bilaterally during Intensive Phase CDT to prevent re-accumulation of lymphatic congestion and progression of fibrosis, to limit infection risk, to improve functional ambulation and transfers, and to promote wound healing.    Baseline dependent    Time 12    Period Weeks    Status New    Target Date 11/22/20      OT LONG TERM GOAL #4   Title With max caregiver assistance Pt will achieve and sustain a least 85% compliance with all LE self-care home program components throughout Intensive Phase CDT, including elevation when seated, daily skin inspection and care, lymphatic pumping ther ex, 23/7 compression wraps and simple self-MLD. Max caregiver assistance is necessary to ensure optimal limb volume reduction, to limit infection risk and to limit LE progression. Pt will arrange for assistance before we begin CDT.    Baseline dependent    Time 12    Period Weeks    Status New    Target Date 11/22/20      OT LONG TERM GOAL #5   Title Using assistive devices (modified independence) Pt will be able to don  and doff appropriate daytime compression garments to limit lymphatic re-accumulation and LE progression before transitioning to self-management phase of CDT.Using assistive devices (modified independence) Pt will be able to don and doff appropriate daytime compression garments to limit lymphatic re-accumulation and LE progression before transitioning to self-management phase of CDT.    Baseline Max A    Time 12    Period Weeks    Status New  Target Date 11/22/20                   Plan - 09/13/20 1411     Clinical Impression Statement Pt demonstrates excellent compliance with skin care andgradient compression wraps between visits. Limb volumes are notably decreased bilaterally and skin condition  hydration continues to improve. Provided BLE skin care as established, and provided LLE MLD and gradient compression wrap to the knee without increased pain, Pt fully engaged throughout session and asks thughtful questions re Rx protocol thus far. Cont as per POC.    OT Occupational Profile and History Comprehensive Assessment- Review of records and extensive additional review of physical, cognitive, psychosocial history related to current functional performance    Occupational performance deficits (Please refer to evaluation for details): IADL's;ADL's;Rest and Sleep;Work;Leisure;Social Participation   w max CG assist for home program   Body Structure / Function / Physical Skills ADL;Edema;Skin integrity;Pain;Decreased knowledge of precautions;Decreased knowledge of use of DME;IADL    Rehab Potential Good    Clinical Decision Making Several treatment options, min-mod task modification necessary    Comorbidities Affecting Occupational Performance: Presence of comorbidities impacting occupational performance    Comorbidities impacting occupational performance description: suspected CVI, hx cellulitis, hx dvt    Modification or Assistance to Complete Evaluation  Min-Moderate modification of tasks  or assist with assess necessary to complete eval    OT Frequency 2x / week    OT Duration 12 weeks   and PRN   OT Treatment/Interventions Self-care/ADL training;Therapeutic exercise;Manual Therapy;Coping strategies training;Therapeutic activities;Manual lymph drainage;DME and/or AE instruction;Compression bandaging;Other (comment);Patient/family education   skin care with low ph castor oil and/ or eucerin lotion   Plan Intensive and Self-Management Phases of Complete Decongestive Therapy (CDT) to include manual lymphatic drainage, skin care, compression wraps and garments, therapeutic exercise, caregiver training    Recommended Other Services fit with BLE, knee length, custom compression garments and HOS devices needed to limit worsening fibrosis formation during HOS. Consider fitting with advanced, sequential , pneumatic compression device, or Flexitouch "pump" medically necessary for optimal level of independence with LE self-management at home over time    Consulted and Agree with Plan of Care Patient;Family member/caregiver             Patient will benefit from skilled therapeutic intervention in order to improve the following deficits and impairments:   Body Structure / Function / Physical Skills: ADL, Edema, Skin integrity, Pain, Decreased knowledge of precautions, Decreased knowledge of use of DME, IADL       Visit Diagnosis: Lymphedema, not elsewhere classified    Problem List Patient Active Problem List   Diagnosis Date Noted   Ulcers of both lower legs (HCC) 09/09/2019   Lymphedema of both lower extremities 09/09/2019   Sinus tachycardia 08/06/2019   Chronic diastolic heart failure (HCC)    Benign prostatic hyperplasia with lower urinary tract symptoms 07/02/2019   Snoring 07/02/2019   Cellulitis of left leg 05/15/2019   Left leg cellulitis 05/11/2019   Swelling abdomen 05/11/2019   Thiamine deficiency 12/28/2018   Pneumonia 03/28/2018   Subacute delirium 03/23/2018    Difficulty urinating 02/03/2018   Peripheral neuropathy 05/07/2017   Folate deficiency 05/07/2017   Pleural effusion, left 04/12/2016   Peripheral edema 02/29/2016   Hoarseness of voice 11/23/2015   Essential hypertension, benign 10/20/2015   Schizoaffective disorder, depressive type (HCC) 02/17/2015   Vitamin D deficiency 02/14/2015   Low back pain 10/27/2014   Dyspnea on exertion 07/19/2014  Acute upper back pain 05/21/2013   Psychotic mood disorder (HCC) 05/14/2012   History of DVT (deep vein thrombosis) 07/06/2010   History of patellar fracture 07/06/2010   HYPERCHOLESTEROLEMIA 10/13/2009   PREDIABETES 10/13/2009   Anxiety state 06/02/2006   TOBACCO ABUSE 06/02/2006   Chronic back pain 06/02/2006    Loel Dubonnet, MS, OTR/L, [facility] 09/13/20 2:28 PM   [place] [facility] MAIN [facility] SERVICES [address] [place], [place], [address] Phone: [phone]   Fax:  [phone]  Name: Jeffrey Lin MRN: [id number] Date of Birth: 24-May-1975

## 2020-09-15 ENCOUNTER — Other Ambulatory Visit: Payer: Self-pay

## 2020-09-15 ENCOUNTER — Ambulatory Visit: Payer: Medicare Other | Admitting: Occupational Therapy

## 2020-09-15 DIAGNOSIS — I890 Lymphedema, not elsewhere classified: Secondary | ICD-10-CM | POA: Diagnosis not present

## 2020-09-15 NOTE — Therapy (Signed)
[place] [facility] [address] [place], [place], [address] Phone: [phone]   Fax:  [phone]  Occupational Therapy Treatment  Patient Details  Name: Jeffrey Lin MRN: [id number] Date of Birth: 10/01/1975 Referring Provider (OT): Allen DerryHoyt Stone, [place]   Encounter Date: 09/15/2020   OT End of Session - 09/15/20 1224     Visit Number 7    Number of Visits 36    Date for OT Re-Evaluation 11/21/20    OT Start Time 1000    OT Stop Time 1105    OT Time Calculation (min) 65 min    Activity Tolerance Patient tolerated treatment well;No increased pain    Behavior [facility] Therapy [facility] tasks assessed/performed             Past Medical History:  Diagnosis Date   Anxiety    Chronic leg pain    Depression    Essential hypertension, benign 10/20/2015   Schizoaffective disorder [facility](HCC)     Past Surgical History:  Procedure Laterality Date   Block procedure at pain center  03/27/06   Bone graft from hip to jaw  2004   Bone graft right side of head to jaw  2006   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   Subjective Assessment - 09/15/20 1228     Subjective  Jeffrey Lin Lin presents for OT  treatment visit 7/ 36 to address BLE lymphedema. He is un accompanied by today. [name] reports leg pan of 5/10. Pt has no new concerns. He continues to wrap his leg and perform daily skin care bilaterally as directed by report.    Patient is accompanied by: Family member    Pertinent History H&P relevant to BLE lymphedema:   tobacco abuse, HTN, hx BLE ulcers, hx cellulitis, Hx DVT, Hx patellar fx, chronic BLE pain, chronic BLE lymphedema,  neuropathy, Hx pneumonia, snoring. chronic diastolic heart failure, sinus tachycardia, chronic back pain, psychotic mood disorder/ Schitzoaffective disorder- depressive type, snoring    Limitations chronic leg pain and swelling, decreased balance,    Repetition Increases Symptoms    Special Tests Intake FOTO: 45/100    Patient Stated Goals get pain and swelling down in my legs    Pain Onset More than a month ago   approximately 4 years ago   Pain Onset More than a month ago                          OT Treatments/Exercises (OP) - 09/15/20 1228       ADLs   ADL Education Given Yes      Manual Therapy   Manual Therapy Edema management;Compression Bandaging;Manual Lymphatic Drainage (MLD)    Manual Lymphatic Drainage (MLD) MLD as established utilizing short neck sequence, deep abdominal breathing, functional inguinal LN, and proximal to distal J strokes and fibrosis techniques  from thigh to toes. Performed simultaneous skin care throughout MLD with low ph castor oil    Compression Bandaging Added custom fabricated dorsal foot chip pad and lateral malleolus pad to wraps . Multilayer compression wraps from base of toes to below the knee using  8. 10 and 12 cm short stretch bandages   layered over 0.4 cm thick Rosidal foam and cotton stockinett.                    OT Education - 09/15/20 1229     Education Details Continued skilled Pt/caregiver education  And LE ADL training throughout visit for lymphedema self care/ home program, including compression wrapping, compression garment and device wear/care, lymphatic pumping ther ex, simple self-MLD, and skin care. Discussed initial volumetric measurements. Discussed all long term goals.    Person(s) Educated Patient;Parent(s)    Methods Explanation;Demonstration;Handout    Comprehension Verbalized understanding;Returned demonstration;Need further instruction                 OT Long Term Goals - 08/24/20 1206        OT LONG TERM GOAL #1   Title Patient's intake functional score on the FOTO tool is 45 out of 100 (higher number = greater function). Given this patient's comorbidities she will experience at least an increase in function of 4 points, or higher, to increase level of independence with basic ADLs and functional ambulation and mobility.    Baseline 45/100    Time 12    Period Weeks    Status New    Target Date 11/22/20      OT LONG TERM GOAL #2   Title Pt will be able to apply multi-layer, short stretch compression wraps to one leg at a time using correct gradient techniques with maximum caregiver assistance in an effort  to return the affected  limb(s)  to premorbid size and shape, to limit pain and infection risk, and to improve functional mobility and ambulation  for ADLs.    Baseline dependent    Time 4    Period Days    Status New    Target Date --   4th OR Rx visit\     OT LONG TERM GOAL #3   Title Pt will achieve at least 10% limb volume reduction Bilaterally during Intensive Phase CDT to prevent re-accumulation of lymphatic congestion and progression of fibrosis, to limit infection risk, to improve functional ambulation and transfers, and to promote wound healing.    Baseline dependent    Time 12    Period Weeks    Status New    Target Date 11/22/20      OT LONG TERM GOAL #4   Title With max caregiver assistance Pt will achieve and sustain a least 85% compliance with all LE self-care home program components throughout Intensive Phase CDT, including elevation when seated, daily skin inspection and care, lymphatic pumping ther ex, 23/7 compression wraps and simple self-MLD. Max caregiver assistance is necessary to ensure optimal limb volume reduction, to limit infection risk and to limit LE progression. Pt will arrange for assistance before we begin CDT.    Baseline dependent    Time 12    Period Weeks    Status New    Target Date 11/22/20      OT LONG TERM GOAL #5   Title  Using assistive devices (modified independence) Pt will be able to don and doff appropriate daytime compression garments to limit lymphatic re-accumulation and LE progression before transitioning to self-management phase of CDT.Using assistive devices (modified independence) Pt will be able to don and doff appropriate daytime compression garments to limit lymphatic re-accumulation and LE progression before transitioning to self-management phase of CDT.    Baseline Max A  Time 12    Period Weeks    Status New    Target Date 11/22/20                   Plan - 09/15/20 1225     Clinical Impression Statement Provided LLE MLD, skin care and compression therapy  without increased pain. Fabricated accessory pad for lateral malleolus to increase compression in this stubborn area. Pt tolerated dorsal foot chip pad since last  visit without any skin irritation or increased discomfort, Volume at base of toes is visibly decreased in this area today.  Pt remains > 85 % compliant with home program with mother's assistance. Start discussing garment recommendations for LLE next visit. Cont as per POC.    OT Occupational Profile and History Comprehensive Assessment- Review of records and extensive additional review of physical, cognitive, psychosocial history related to current functional performance    Occupational performance deficits (Please refer to evaluation for details): IADL's;ADL's;Rest and Sleep;Work;Leisure;Social Participation   w max CG assist for home program   Body Structure / Function / Physical Skills ADL;Edema;Skin integrity;Pain;Decreased knowledge of precautions;Decreased knowledge of use of DME;IADL    Rehab Potential Good    Clinical Decision Making Several treatment options, min-mod task modification necessary    Comorbidities Affecting Occupational Performance: Presence of comorbidities impacting occupational performance    Comorbidities impacting occupational performance  description: suspected CVI, hx cellulitis, hx dvt    Modification or Assistance to Complete Evaluation  Min-Moderate modification of tasks or assist with assess necessary to complete eval    OT Frequency 2x / week    OT Duration 12 weeks   and PRN   OT Treatment/Interventions Self-care/ADL training;Therapeutic exercise;Manual Therapy;Coping strategies training;Therapeutic activities;Manual lymph drainage;DME and/or AE instruction;Compression bandaging;Other (comment);Patient/family education   skin care with low ph castor oil and/ or eucerin lotion   Plan Intensive and Self-Management Phases of Complete Decongestive Therapy (CDT) to include manual lymphatic drainage, skin care, compression wraps and garments, therapeutic exercise, caregiver training    Recommended Other Services fit with BLE, knee length, custom compression garments and HOS devices needed to limit worsening fibrosis formation during HOS. Consider fitting with advanced, sequential , pneumatic compression device, or Flexitouch "pump" medically necessary for optimal level of independence with LE self-management at home over time    Consulted and Agree with Plan of Care Patient;Family member/caregiver             Patient will benefit from skilled therapeutic intervention in order to improve the following deficits and impairments:   Body Structure / Function / Physical Skills: ADL, Edema, Skin integrity, Pain, Decreased knowledge of precautions, Decreased knowledge of use of DME, IADL       Visit Diagnosis: Lymphedema, not elsewhere classified    Problem List Patient Active Problem List   Diagnosis Date Noted   Ulcers of both lower legs (HCC) 09/09/2019   Lymphedema of both lower extremities 09/09/2019   Sinus tachycardia 08/06/2019   Chronic diastolic heart failure (HCC)    Benign prostatic hyperplasia with lower urinary tract symptoms 07/02/2019   Snoring 07/02/2019   Cellulitis of left leg 05/15/2019   Left leg  cellulitis 05/11/2019   Swelling abdomen 05/11/2019   Thiamine deficiency 12/28/2018   Pneumonia 03/28/2018   Subacute delirium 03/23/2018   Difficulty urinating 02/03/2018   Peripheral neuropathy 05/07/2017   Folate deficiency 05/07/2017   Pleural effusion, left 04/12/2016   Peripheral edema 02/29/2016   Hoarseness of voice 11/23/2015   Essential hypertension,  benign 10/20/2015   Schizoaffective disorder, depressive type (HCC) 02/17/2015   Vitamin D deficiency 02/14/2015   Low back pain 10/27/2014   Dyspnea on exertion 07/19/2014   Acute upper back pain 05/21/2013   Psychotic mood disorder (HCC) 05/14/2012   History of DVT (deep vein thrombosis) 07/06/2010   History of patellar fracture 07/06/2010   HYPERCHOLESTEROLEMIA 10/13/2009   PREDIABETES 10/13/2009   Anxiety state 06/02/2006   TOBACCO ABUSE 06/02/2006   Chronic back pain 06/02/2006    Loel Dubonnet, MS, OTR/L, [facility] 09/15/20 12:30 PM    [facility] MAIN [facility] SERVICES [address] [place], [place], [address] Phone: [phone]   Fax:  [phone]  Name: Jeffrey Lin MRN: [id number] Date of Birth: September 10, 1975

## 2020-09-15 NOTE — Patient Instructions (Signed)

## 2020-09-18 ENCOUNTER — Ambulatory Visit: Payer: Medicare Other | Admitting: Occupational Therapy

## 2020-09-18 ENCOUNTER — Other Ambulatory Visit: Payer: Self-pay

## 2020-09-18 DIAGNOSIS — I890 Lymphedema, not elsewhere classified: Secondary | ICD-10-CM | POA: Diagnosis not present

## 2020-09-18 NOTE — Therapy (Signed)
[facility] MAIN [facility] SERVICES [address] [place], [place], [address] Phone: [phone]   Fax:  [phone]  Occupational Therapy Treatment  Patient Details  Name: Jeffrey Lin MRN: [id number] Date of Birth: 1975-08-09 Referring Provider (OT): Allen Derry, [place]   Encounter Date: 09/18/2020   OT End of Session - 09/18/20 1234     Visit Number 8    Number of Visits 36    Date for OT Re-Evaluation 11/21/20    OT Start Time 1105    OT Stop Time 1215    OT Time Calculation (min) 70 min    Activity Tolerance Patient tolerated treatment well;No increased pain    Behavior During Therapy [facility] for tasks assessed/performed             Past Medical History:  Diagnosis Date   Anxiety    Chronic leg pain    Depression    Essential hypertension, benign 10/20/2015   Schizoaffective disorder [facility])     Past Surgical History:  Procedure Laterality Date   Block procedure at pain center  03/27/06   Bone graft from hip to jaw  2004   Bone graft right side of head to jaw  2006   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   Subjective Assessment - 09/18/20 1239     Subjective  Jeffrey Lin presents for OT  treatment visit 8/ 36 to address BLE lymphedema. He is un accompanied by today. Jeffrey Lin reports, "My L leg is tender to the touch today , but not in the bandages." Jeffrey Lin reports he wrapped as instructewd over the weekend and elevated legs a lot without any problems.    Patient is accompanied by: Family member    Pertinent History H&P relevant to BLE lymphedema:   tobacco abuse, HTN, hx BLE ulcers, hx cellulitis, Hx DVT, Hx patellar fx, chronic  BLE pain, chronic BLE lymphedema, neuropathy, Hx pneumonia, snoring. chronic diastolic heart failure, sinus tachycardia, chronic back pain, psychotic mood disorder/ Schitzoaffective disorder- depressive type, snoring    Limitations chronic leg pain and swelling, decreased balance,    Repetition Increases Symptoms    Special Tests Intake FOTO: 45/100    Patient Stated Goals get pain and swelling down in my legs    Pain Onset More than a month ago   approximately 4 years ago   Pain Onset More than a month ago                          OT Treatments/Exercises (OP) - 09/18/20 1245       ADLs   ADL Education Given Yes      Manual Therapy   Manual Therapy Edema management;Compression Bandaging;Manual Lymphatic Drainage (MLD)    Manual Lymphatic Drainage (MLD) MLD as established utilizing short neck sequence, deep abdominal breathing, functional inguinal LN, and proximal to distal J strokes and fibrosis techniques  from thigh to toes. Performed simultaneous skin care throughout MLD with low ph castor oil    Compression Bandaging Added custom fabricated dorsal foot chip pad and lateral malleolus pad to wraps . Multilayer compression wraps from base  of toes to below the knee using 8. 10 and 12 cm short stretch bandages   layered over 0.4 cm thick Rosidal foam and cotton stockinett.                    OT Education - 09/18/20 1241     Education Details Reviewed lymphatic structure and function in relation to blood circulatory system and how both systems  may impact the other in regard to swelling, fibrosis and skin integrity. Taught lymphatic pumping ther ex and provided handouts for skin care instructions and ther ex. Good return. Good questions    Person(s) Educated Patient;Parent(s)    Methods Explanation;Demonstration;Handout    Comprehension Verbalized understanding;Returned demonstration;Need further instruction                 OT Long Term Goals - 08/24/20  1206       OT LONG TERM GOAL #1   Title Patient's intake functional score on the FOTO tool is 45 out of 100 (higher number = greater function). Given this patient's comorbidities she will experience at least an increase in function of 4 points, or higher, to increase level of independence with basic ADLs and functional ambulation and mobility.    Baseline 45/100    Time 12    Period Weeks    Status New    Target Date 11/22/20      OT LONG TERM GOAL #2   Title Jeffrey Lin will be able to apply multi-layer, short stretch compression wraps to one leg at a time using correct gradient techniques with maximum caregiver assistance in an effort  to return the affected  limb(s)  to premorbid size and shape, to limit pain and infection risk, and to improve functional mobility and ambulation  for ADLs.    Baseline dependent    Time 4    Period Days    Status New    Target Date --   4th OR Rx visit\     OT LONG TERM GOAL #3   Title Jeffrey Lin will achieve at least 10% limb volume reduction Bilaterally during Intensive Phase CDT to prevent re-accumulation of lymphatic congestion and progression of fibrosis, to limit infection risk, to improve functional ambulation and transfers, and to promote wound healing.    Baseline dependent    Time 12    Period Weeks    Status New    Target Date 11/22/20      OT LONG TERM GOAL #4   Title With max caregiver assistance Jeffrey Lin will achieve and sustain a least 85% compliance with all LE self-care home program components throughout Intensive Phase CDT, including elevation when seated, daily skin inspection and care, lymphatic pumping ther ex, 23/7 compression wraps and simple self-MLD. Max caregiver assistance is necessary to ensure optimal limb volume reduction, to limit infection risk and to limit LE progression. Jeffrey Lin will arrange for assistance before we begin CDT.    Baseline dependent    Time 12    Period Weeks    Status New    Target Date 11/22/20      OT LONG TERM GOAL #5    Title Using assistive devices (modified independence) Jeffrey Lin will be able to don and doff appropriate daytime compression garments to limit lymphatic re-accumulation and LE progression before transitioning to self-management phase of CDT.Using assistive devices (modified independence) Jeffrey Lin will be able to don and doff appropriate daytime compression garments to limit lymphatic re-accumulation and LE progression before transitioning to self-management phase  of CDT.    Baseline Max A    Time 12    Period Weeks    Status New    Target Date 11/22/20                   Plan - 09/18/20 1235     Clinical Impression Statement Jeffrey Lin used supplementary pads on dorsal L foot and at lateral L maleolus after last visit and over the weekend without difficulty. tissue density is visibly and palpably decreased at both areas with dense fibrosis. Malleolar fullness is more pitting today. Tissue flexibility at both regions is increased.  Provided LLE MLD, skin care and compression therapy  without increased pain. Provided skilled training for lymphatic structure and function relative to blood circulation, tissue swelling, and tissue fibrosis formation as LE advances over time. Next visit we will complete anatomical measurements for L custom garment . Cont as per POC.    OT Occupational Profile and History Comprehensive Assessment- Review of records and extensive additional review of physical, cognitive, psychosocial history related to current functional performance    Occupational performance deficits (Please refer to evaluation for details): IADL's;ADL's;Rest and Sleep;Work;Leisure;Social Participation   w max CG assist for home program   Body Structure / Function / Physical Skills ADL;Edema;Skin integrity;Pain;Decreased knowledge of precautions;Decreased knowledge of use of DME;IADL    Rehab Potential Good    Clinical Decision Making Several treatment options, min-mod task modification necessary    Comorbidities  Affecting Occupational Performance: Presence of comorbidities impacting occupational performance    Comorbidities impacting occupational performance description: suspected CVI, hx cellulitis, hx dvt    Modification or Assistance to Complete Evaluation  Min-Moderate modification of tasks or assist with assess necessary to complete eval    OT Frequency 2x / week    OT Duration 12 weeks   and PRN   OT Treatment/Interventions Self-care/ADL training;Therapeutic exercise;Manual Therapy;Coping strategies training;Therapeutic activities;Manual lymph drainage;DME and/or AE instruction;Compression bandaging;Other (comment);Patient/family education   skin care with low ph castor oil and/ or eucerin lotion   Plan Intensive and Self-Management Phases of Complete Decongestive Therapy (CDT) to include manual lymphatic drainage, skin care, compression wraps and garments, therapeutic exercise, caregiver training    Recommended Other Services fit with BLE, knee length, custom compression garments and HOS devices needed to limit worsening fibrosis formation during HOS. Consider fitting with advanced, sequential , pneumatic compression device, or Flexitouch "pump" medically necessary for optimal level of independence with LE self-management at home over time    Consulted and Agree with Plan of Care Patient;Family member/caregiver             Patient will benefit from skilled therapeutic intervention in order to improve the following deficits and impairments:   Body Structure / Function / Physical Skills: ADL, Edema, Skin integrity, Pain, Decreased knowledge of precautions, Decreased knowledge of use of DME, IADL       Visit Diagnosis: Lymphedema, not elsewhere classified    Problem List Patient Active Problem List   Diagnosis Date Noted   Ulcers of both lower legs (HCC) 09/09/2019   Lymphedema of both lower extremities 09/09/2019   Sinus tachycardia 08/06/2019   Chronic diastolic heart failure (HCC)     Benign prostatic hyperplasia with lower urinary tract symptoms 07/02/2019   Snoring 07/02/2019   Cellulitis of left leg 05/15/2019   Left leg cellulitis 05/11/2019   Swelling abdomen 05/11/2019   Thiamine deficiency 12/28/2018   Pneumonia 03/28/2018   Subacute delirium 03/23/2018   Difficulty urinating  02/03/2018   Peripheral neuropathy 05/07/2017   Folate deficiency 05/07/2017   Pleural effusion, left 04/12/2016   Peripheral edema 02/29/2016   Hoarseness of voice 11/23/2015   Essential hypertension, benign 10/20/2015   Schizoaffective disorder, depressive type (HCC) 02/17/2015   Vitamin D deficiency 02/14/2015   Low back pain 10/27/2014   Dyspnea on exertion 07/19/2014   Acute upper back pain 05/21/2013   Psychotic mood disorder (HCC) 05/14/2012   History of DVT (deep vein thrombosis) 07/06/2010   History of patellar fracture 07/06/2010   HYPERCHOLESTEROLEMIA 10/13/2009   PREDIABETES 10/13/2009   Anxiety state 06/02/2006   TOBACCO ABUSE 06/02/2006   Chronic back pain 06/02/2006    Jeffrey Dubonnet, Jeffrey Lin, Jeffrey Lin, Jeffrey Lin 09/18/20 12:47 PM   [facility] MAIN [facility] SERVICES [address] [place], [place], [address] Phone: [phone]   Fax:  [phone]  Name: Jeffrey Lin MRN: [id number] Date of Birth: 09-18-1975

## 2020-09-20 ENCOUNTER — Ambulatory Visit: Payer: Medicare Other | Admitting: Occupational Therapy

## 2020-09-20 ENCOUNTER — Other Ambulatory Visit: Payer: Self-pay

## 2020-09-20 DIAGNOSIS — I890 Lymphedema, not elsewhere classified: Secondary | ICD-10-CM

## 2020-09-20 NOTE — Patient Instructions (Signed)

## 2020-09-20 NOTE — Therapy (Signed)
[facility]IN [facility] [address] [place], [place], [address] Phone: [phone]   Fax:  [phone]  Occupational Therapy Treatment  Patient Details  Name: Jeffrey GillisBobby G Lin MRN: [id number] Date of Birth: 01/12/1976 Referring Provider (OT): Allen DerryHoyt Stone, [place]   Encounter Date: 09/20/2020   OT End of Session - 09/20/20 1007     Visit Number 9    Number of Visits 36    Date for OT Re-Evaluation 11/21/20    OT Start Time 1000    OT Stop Time 1115    OT Time Calculation (min) 75 min    Activity Tolerance Patient tolerated treatment well;No increased pain    Behavior During Therapy [facility] for tasks assessed/performed             Past Medical History:  Diagnosis Date   Anxiety    Chronic leg pain    Depression    Essential hypertension, benign 10/20/2015   Schizoaffective disorder [facility](HCC)     Past Surgical History:  Procedure Laterality Date   Block procedure at pain center  03/27/06   Bone graft from hip to jaw  2004   Bone graft right side of head to jaw  2006   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   Subjective Assessment - 09/20/20 1237     Subjective  Jeffrey Lin presents for OT  treatment visit 9/ 36 to address BLE lymphedema. He is un accompanied by today. Pt reports mild pain in RLE, but does not rate pain numerically. Pt rates LLE pain as 0/10 this morning. Pt reports to clinic with LLE compression wraps in place.    Patient is accompanied by: Family member    Pertinent History H&P relevant to BLE lymphedema:   tobacco abuse, HTN, hx BLE ulcers, hx cellulitis, Hx DVT, Hx patellar fx, chronic BLE pain, chronic BLE  lymphedema, neuropathy, Hx pneumonia, snoring. chronic diastolic heart failure, sinus tachycardia, chronic back pain, psychotic mood disorder/ Schitzoaffective disorder- depressive type, snoring    Limitations chronic leg pain and swelling, decreased balance,    Repetition Increases Symptoms    Special Tests Intake FOTO: 45/100    Patient Stated Goals get pain and swelling down in my legs    Pain Onset More than a month ago   approximately 4 years ago   Pain Onset More than a month ago                 LYMPHEDEMA/ONCOLOGY QUESTIONNAIRE - 09/20/20 1242       Lymphedema Assessments   Lymphedema Assessments Lower extremities      Left Lower Extremity Lymphedema   Other Visit 9: L leg (Rx)  limb volume ankle to popliteal fossa (A-D) measures 4233.2 ml.    Other LLE limb volume from ankle to tibial tuberosity is decreased by 24.14%. This reduction value meets and exceeds the 10% goal set initially.                     OT Treatments/Exercises (OP) - 09/20/20 1239       ADLs   ADL Education Given Yes      Manual  Therapy   Manual Therapy Edema management;Compression Bandaging;Manual Lymphatic Drainage (MLD)    Manual therapy comments completed anatomical measurements for LLE custom Elvarex SOFT, knee length, ccl 3 stocking Lat knit ccl 3 = 35-46 mmHg)    Edema Management completed LLE comparative limb volumetrics    Manual Lymphatic Drainage (MLD) MLD as established utilizing short neck sequence, deep abdominal breathing, functional inguinal LN, and proximal to distal J strokes and fibrosis techniques  from thigh to toes. Performed simultaneous skin care throughout MLD with low ph castor oil    Compression Bandaging Reapplied commpression wraps as establishedAdded custom fabricated dorsal foot chip pad and lateral malleolus pad to wraps . Multilayer compression wraps from base of toes to below the knee using 8. 10 and 12 cm short stretch bandages   layered over 0.4 cm thick  Rosidal foam and cotton stockinett.                    OT Education - 09/20/20 1247     Education Details Pt edu focused on differences between custom  lat knit and off-the-shelf circular elastic compression garments.  Educated Pt re indications for both and pros and cons of each, and rational for therapist's current recommendations  Educated Pt re estimated costs, measuring and fitting process ,DME vendor's involvement and access to insurance benefits. Pt edu for outcoms of volumetric comparison.    Person(s) Educated Patient;Parent(s)    Methods Explanation;Demonstration;Handout    Comprehension Verbalized understanding;Returned demonstration;Need further instruction                 OT Long Term Goals - 08/24/20 1206       OT LONG TERM GOAL #1   Title Patient's intake functional score on the FOTO tool is 45 out of 100 (higher number = greater function). Given this patient's comorbidities she will experience at least an increase in function of 4 points, or higher, to increase level of independence with basic ADLs and functional ambulation and mobility.    Baseline 45/100    Time 12    Period Weeks    Status New    Target Date 11/22/20      OT LONG TERM GOAL #2   Title Pt will be able to apply multi-layer, short stretch compression wraps to one leg at a time using correct gradient techniques with maximum caregiver assistance in an effort  to return the affected  limb(s)  to premorbid size and shape, to limit pain and infection risk, and to improve functional mobility and ambulation  for ADLs.    Baseline dependent    Time 4    Period Days    Status New    Target Date --   4th OR Rx visit\     OT LONG TERM GOAL #3   Title Pt will achieve at least 10% limb volume reduction Bilaterally during Intensive Phase CDT to prevent re-accumulation of lymphatic congestion and progression of fibrosis, to limit infection risk, to improve functional ambulation and transfers, and to  promote wound healing.    Baseline dependent    Time 12    Period Weeks    Status New    Target Date 11/22/20      OT LONG TERM GOAL #4   Title With max caregiver assistance Pt will achieve and sustain a least 85% compliance with all LE self-care home program components throughout Intensive Phase CDT, including elevation when seated, daily skin inspection and care, lymphatic pumping ther ex,  23/7 compression wraps and simple self-MLD. Max caregiver assistance is necessary to ensure optimal limb volume reduction, to limit infection risk and to limit LE progression. Pt will arrange for assistance before we begin CDT.    Baseline dependent    Time 12    Period Weeks    Status New    Target Date 11/22/20      OT LONG TERM GOAL #5   Title Using assistive devices (modified independence) Pt will be able to don and doff appropriate daytime compression garments to limit lymphatic re-accumulation and LE progression before transitioning to self-management phase of CDT.Using assistive devices (modified independence) Pt will be able to don and doff appropriate daytime compression garments to limit lymphatic re-accumulation and LE progression before transitioning to self-management phase of CDT.    Baseline Max A    Time 12    Period Weeks    Status New    Target Date 11/22/20                   Plan - 09/20/20 1251     Clinical Impression Statement LLE comparative limb volumetrics reveal LLE volume from ankle to tibial tuberosity is reduced by 24% since commencing OT for CDT 08/30/20. Pt has achieved a dramatic reduction below the knee in a short time. This value meets and exceeds the 10% reduction goal for the LLE. Completed anatomical measurements for custom, LLE, knee length compression garments. Once fitted we will  commence CDT to RLE. Applied wraps as established to LLE. Cont as per POC.    OT Occupational Profile and History Comprehensive Assessment- Review of records and extensive  additional review of physical, cognitive, psychosocial history related to current functional performance    Occupational performance deficits (Please refer to evaluation for details): IADL's;ADL's;Rest and Sleep;Work;Leisure;Social Participation   w max CG assist for home program   Body Structure / Function / Physical Skills ADL;Edema;Skin integrity;Pain;Decreased knowledge of precautions;Decreased knowledge of use of DME;IADL    Rehab Potential Good    Clinical Decision Making Several treatment options, min-mod task modification necessary    Comorbidities Affecting Occupational Performance: Presence of comorbidities impacting occupational performance    Comorbidities impacting occupational performance description: suspected CVI, hx cellulitis, hx dvt    Modification or Assistance to Complete Evaluation  Min-Moderate modification of tasks or assist with assess necessary to complete eval    OT Frequency 2x / week    OT Duration 12 weeks   and PRN   OT Treatment/Interventions Self-care/ADL training;Therapeutic exercise;Manual Therapy;Coping strategies training;Therapeutic activities;Manual lymph drainage;DME and/or AE instruction;Compression bandaging;Other (comment);Patient/family education   skin care with low ph castor oil and/ or eucerin lotion   Plan Intensive and Self-Management Phases of Complete Decongestive Therapy (CDT) to include manual lymphatic drainage, skin care, compression wraps and garments, therapeutic exercise, caregiver training    Recommended Other Services fit with BLE, knee length, custom compression garments and HOS devices needed to limit worsening fibrosis formation during HOS. Consider fitting with advanced, sequential , pneumatic compression device, or Flexitouch "pump" medically necessary for optimal level of independence with LE self-management at home over time    Consulted and Agree with Plan of Care Patient;Family member/caregiver             Patient will benefit  from skilled therapeutic intervention in order to improve the following deficits and impairments:   Body Structure / Function / Physical Skills: ADL, Edema, Skin integrity, Pain, Decreased knowledge of precautions, Decreased knowledge of use of  DME, IADL       Visit Diagnosis: Lymphedema, not elsewhere classified    Problem List Patient Active Problem List   Diagnosis Date Noted   Ulcers of both lower legs (HCC) 09/09/2019   Lymphedema of both lower extremities 09/09/2019   Sinus tachycardia 08/06/2019   Chronic diastolic heart failure (HCC)    Benign prostatic hyperplasia with lower urinary tract symptoms 07/02/2019   Snoring 07/02/2019   Cellulitis of left leg 05/15/2019   Left leg cellulitis 05/11/2019   Swelling abdomen 05/11/2019   Thiamine deficiency 12/28/2018   Pneumonia 03/28/2018   Subacute delirium 03/23/2018   Difficulty urinating 02/03/2018   Peripheral neuropathy 05/07/2017   Folate deficiency 05/07/2017   Pleural effusion, left 04/12/2016   Peripheral edema 02/29/2016   Hoarseness of voice 11/23/2015   Essential hypertension, benign 10/20/2015   Schizoaffective disorder, depressive type (HCC) 02/17/2015   Vitamin D deficiency 02/14/2015   Low back pain 10/27/2014   Dyspnea on exertion 07/19/2014   Acute upper back pain 05/21/2013   Psychotic mood disorder (HCC) 05/14/2012   History of DVT (deep vein thrombosis) 07/06/2010   History of patellar fracture 07/06/2010   HYPERCHOLESTEROLEMIA 10/13/2009   PREDIABETES 10/13/2009   Anxiety state 06/02/2006   TOBACCO ABUSE 06/02/2006   Chronic back pain 06/02/2006   Loel Dubonnet, MS, OTR/L, CLT-LANA 09/20/20 12:57 PM    [facility] MAIN [facility] SERVICES [address] [place], [place], [address] Phone: [phone]   Fax:  [phone]  Name: Jeffrey Lin MRN: [id number] Date of Birth: 25-Dec-1975

## 2020-09-22 ENCOUNTER — Encounter: Payer: Medicare Other | Admitting: Occupational Therapy

## 2020-09-27 ENCOUNTER — Other Ambulatory Visit: Payer: Self-pay

## 2020-09-27 ENCOUNTER — Ambulatory Visit: Payer: Medicare Other | Attending: Physician Assistant | Admitting: Occupational Therapy

## 2020-09-27 DIAGNOSIS — I890 Lymphedema, not elsewhere classified: Secondary | ICD-10-CM | POA: Diagnosis not present

## 2020-09-28 NOTE — Therapy (Signed)
[place] MAIN [facility] SERVICES [address] [place], [place], [address] Phone: [phone]   Fax:  [phone]  Occupational Therapy Treatment  Patient Details  Name: Jeffrey Lin MRN: [id number] Date of Birth: 1975/06/07 Referring Provider (OT): Jeri Cos, [place]   Encounter Date: 09/27/2020   OT End of Session - 09/28/20 1050     Visit Number 10    Authorization Type 10th visit progress note completed 9/8/ 22 , Reporting Period 08/30/20- 09/27/20    OT Start Time 0900    OT Stop Time 1010    OT Time Calculation (min) 70 min    Activity Tolerance Patient tolerated treatment well;No increased pain    Behavior During Therapy [facility] for tasks assessed/performed             Past Medical History:  Diagnosis Date   Anxiety    Chronic leg pain    Depression    Essential hypertension, benign 10/20/2015   Schizoaffective disorder [facility])     Past Surgical History:  Procedure Laterality Date   Block procedure at pain center  03/27/06   Bone graft from hip to jaw  2004   Bone graft right side of head to jaw  2006   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   Subjective Assessment - 09/27/20 0910     Subjective  Jeffrey Lin presents for OT  treatment visit 10/ 36 to address BLE lymphedema. He is un accompanied by today. Pt reports pain in RLE 4-5/10 at mid anterior tibia, described as a "stinging burning". Pt rates LLE pain as 3/10 this morning.  Pt expresses concern about petechiae on L leg.    Patient is accompanied by: Family member    Pertinent History H&P relevant to BLE lymphedema:   tobacco abuse, HTN, hx BLE ulcers, hx cellulitis, Hx  DVT, Hx patellar fx, chronic BLE pain, chronic BLE lymphedema, neuropathy, Hx pneumonia, snoring. chronic diastolic heart failure, sinus tachycardia, chronic back pain, psychotic mood disorder/ Schitzoaffective disorder- depressive type, snoring    Limitations chronic leg pain and swelling, decreased balance,    Repetition Increases Symptoms    Special Tests Intake FOTO: 45/100    Patient Stated Goals get pain and swelling down in my legs    Pain Onset More than a month ago   approximately 4 years ago   Pain Onset More than a month ago                 LYMPHEDEMA/ONCOLOGY QUESTIONNAIRE - 09/28/20 1301       Lymphedema Assessments   Lymphedema Assessments Lower extremities      Left Lower Extremity Lymphedema   Other Visit 9: L leg (Rx)  limb volume ankle to popliteal fossa (A-D) measures 4233.2 ml.    Other LLE limb volume from ankle to tibial tuberosity is decreased by 24.14%. This reduction value meets and exceeds the 10% goal set initially.                     OT Treatments/Exercises (OP) - 09/28/20 1059       ADLs   ADL Education Given Yes  Manual Therapy   Manual Therapy Edema management;Compression Bandaging    Manual Lymphatic Drainage (MLD) MLD as established utilizing short neck sequence, deep abdominal breathing, functional inguinal LN, and proximal to distal J strokes and fibrosis techniques  from thigh to toes. Performed simultaneous skin care throughout MLD with low ph castor oil    Compression Bandaging Reapplied commpression wraps as establishedAdded custom fabricated dorsal foot chip pad and lateral malleolus pad to wraps . Multilayer compression wraps from base of toes to below the knee using 8. 10 and 12 cm short stretch bandages   layered over 0.4 cm thick Rosidal foam and cotton stockinett.                    OT Education - 09/27/20 1301     Education Details Pt edu focused on differences between custom  lat knit and  off-the-shelf circular elastic compression garments.  Educated Pt re indications for both and pros and cons of each, and rational for therapist's current recommendations  Educated Pt re estimated costs, measuring and fitting process ,DME vendor's involvement and access to insurance benefits. Pt edu for outcoms of volumetric comparison.    Person(s) Educated Patient;Parent(s)    Methods Explanation;Demonstration;Handout    Comprehension Verbalized understanding;Returned demonstration;Need further instruction                 OT Long Term Goals - 09/27/20 1055       OT LONG TERM GOAL #1   Title Patient's intake functional score on the FOTO tool is 45 out of 100 (higher number = greater function). Given this patient's comorbidities she will experience at least an increase in function of 4 points, or higher, to increase level of independence with basic ADLs and functional ambulation and mobility.    Baseline 45/100    Time 12    Period Weeks    Status On-going   TBA next session due to time constraints     OT LONG TERM GOAL #2   Title Pt will be able to apply multi-layer, short stretch compression wraps to one leg at a time using correct gradient techniques with maximum caregiver assistance in an effort  to return the affected  limb(s)  to premorbid size and shape, to limit pain and infection risk, and to improve functional mobility and ambulation  for ADLs.    Baseline dependent    Time 4    Period Days    Status Achieved   Goal met and exceeded. Pt independent with applying wraps and laundering them.     OT LONG TERM GOAL #3   Title Pt will achieve at least 10% limb volume reduction Bilaterally during Intensive Phase CDT to prevent re-accumulation of lymphatic congestion and progression of fibrosis, to limit infection risk, to improve functional ambulation and transfers, and to promote wound healing.    Baseline dependent    Time 12    Period Weeks    Status Partially Met   Met for  LLE w/ 24.14% reduction measured 09/20/20.   Target Date 11/22/20      OT LONG TERM GOAL #4   Title With max caregiver assistance Pt will achieve and sustain a least 85% compliance with all LE self-care home program components throughout Intensive Phase CDT, including elevation when seated, daily skin inspection and care, lymphatic pumping ther ex, 23/7 compression wraps and simple self-MLD. Max caregiver assistance is necessary to ensure optimal limb volume reduction, to limit infection risk and to limit  LE progression. Pt will arrange for assistance before we begin CDT.    Baseline dependent    Time 12    Period Weeks    Status Achieved   Met and exceeded. Pt remains 100% compliant with all LE self-care home program components     OT LONG TERM GOAL #5   Title Using assistive devices (modified independence) Pt will be able to don and doff appropriate daytime compression garments to limit lymphatic re-accumulation and LE progression before transitioning to self-management phase of CDT.Using assistive devices (modified independence) Pt will be able to don and doff appropriate daytime compression garments to limit lymphatic re-accumulation and LE progression before transitioning to self-management phase of CDT.    Baseline Max A    Time 12    Period Weeks    Status On-going   LLE custom garment and HOS device ordered. Awaiting fitting.   Target Date 11/22/20                   Plan - 09/28/20 1050     Clinical Impression Statement Reviewed progress towards all OT goals to date. LLE comparative limb volumetrics reveal LLE volume from ankle to tibial tuberosity is reduced by 24% since commencing OT for CDT 08/30/20. Pt has achieved a dramatic reduction below the knee in a short time. This value meets and exceeds the 10% reduction goal for the LLE. Pt has met and exceeded both gradient wrapping goal and home program compliance goal with independence. Please review LONG TERM GOALS section for  additional details. Pt tolerated MLD, skin care and compression wrapping to LLE without increased pain today. Added single roll of 15 cm wide Artiflex to distal leg and ankle  in keeping with Law of [place] to reduce compression at narrowest part of limb.    OT Occupational Profile and History Comprehensive Assessment- Review of records and extensive additional review of physical, cognitive, psychosocial history related to current functional performance    Occupational performance deficits (Please refer to evaluation for details): IADL's;ADL's;Rest and Sleep;Work;Leisure;Social Participation   w max CG assist for home program   Body Structure / Function / Physical Skills ADL;Edema;Skin integrity;Pain;Decreased knowledge of precautions;Decreased knowledge of use of DME;IADL    Rehab Potential Good    Clinical Decision Making Several treatment options, min-mod task modification necessary    Comorbidities Affecting Occupational Performance: Presence of comorbidities impacting occupational performance    Comorbidities impacting occupational performance description: suspected CVI, hx cellulitis, hx dvt    Modification or Assistance to Complete Evaluation  Min-Moderate modification of tasks or assist with assess necessary to complete eval    OT Frequency 2x / week    OT Duration 12 weeks   and PRN   OT Treatment/Interventions Self-care/ADL training;Therapeutic exercise;Manual Therapy;Coping strategies training;Therapeutic activities;Manual lymph drainage;DME and/or AE instruction;Compression bandaging;Other (comment);Patient/family education   skin care with low ph castor oil and/ or eucerin lotion   Plan Intensive and Self-Management Phases of Complete Decongestive Therapy (CDT) to include manual lymphatic drainage, skin care, compression wraps and garments, therapeutic exercise, caregiver training    Recommended Other Services fit with BLE, knee length, custom compression garments and HOS devices needed to  limit worsening fibrosis formation during HOS. Consider fitting with advanced, sequential , pneumatic compression device, or Flexitouch "pump" medically necessary for optimal level of independence with LE self-management at home over time    Consulted and Agree with Plan of Care Patient;Family member/caregiver  Patient will benefit from skilled therapeutic intervention in order to improve the following deficits and impairments:   Body Structure / Function / Physical Skills: ADL, Edema, Skin integrity, Pain, Decreased knowledge of precautions, Decreased knowledge of use of DME, IADL       Visit Diagnosis: Lymphedema, not elsewhere classified    Problem List Patient Active Problem List   Diagnosis Date Noted   Ulcers of both lower legs ([place]) 09/09/2019   Lymphedema of both lower extremities 09/09/2019   Sinus tachycardia 08/06/2019   Chronic diastolic heart failure (HCC)    Benign prostatic hyperplasia with lower urinary tract symptoms 07/02/2019   Snoring 07/02/2019   Cellulitis of left leg 05/15/2019   Left leg cellulitis 05/11/2019   Swelling abdomen 05/11/2019   Thiamine deficiency 12/28/2018   Pneumonia 03/28/2018   Subacute delirium 03/23/2018   Difficulty urinating 02/03/2018   Peripheral neuropathy 05/07/2017   Folate deficiency 05/07/2017   Pleural effusion, left 04/12/2016   Peripheral edema 02/29/2016   Hoarseness of voice 11/23/2015   Essential hypertension, benign 10/20/2015   Schizoaffective disorder, depressive type ([place]) 02/17/2015   Vitamin D deficiency 02/14/2015   Low back pain 10/27/2014   Dyspnea on exertion 07/19/2014   Acute upper back pain 05/21/2013   Psychotic mood disorder ([place]) 05/14/2012   History of DVT (deep vein thrombosis) 07/06/2010   History of patellar fracture 07/06/2010   HYPERCHOLESTEROLEMIA 10/13/2009   PREDIABETES 10/13/2009   Anxiety state 06/02/2006   TOBACCO ABUSE 06/02/2006   Chronic back pain 06/02/2006     Andrey Spearman, MS, OTR/L, CLT-LANA 09/28/20 1:04 PM    [place] [facility] MAIN [facility] SERVICES [address] [place], [place], [address] Phone: [phone]   Fax:  [phone]  Name: Jeffrey Lin MRN: [id number] Date of Birth: 12-08-1975

## 2020-09-28 NOTE — Patient Instructions (Signed)

## 2020-09-29 ENCOUNTER — Ambulatory Visit: Payer: Medicare Other | Admitting: Occupational Therapy

## 2020-09-29 ENCOUNTER — Other Ambulatory Visit: Payer: Self-pay

## 2020-09-29 DIAGNOSIS — I890 Lymphedema, not elsewhere classified: Secondary | ICD-10-CM | POA: Diagnosis not present

## 2020-09-29 NOTE — Therapy (Signed)
[place] MAIN [facility] SERVICES [address] [place], [place], [address] Phone: [phone]   Fax:  [phone]  Occupational Therapy Treatment  Patient Details  Name: Jeffrey Lin MRN: [id number] Date of Birth: 04-23-1975 Referring Provider (OT): Jeri Cos, [place]   Encounter Date: 09/29/2020   OT End of Session - 09/29/20 1136     Visit Number 11    Number of Visits 53    Date for OT Re-Evaluation 11/21/20    Authorization Type 10th visit progress note completed 9/8/ 22 , Reporting Period 08/30/20- 09/27/20    OT Start Time 1005    OT Stop Time 1105    OT Time Calculation (min) 60 min    Activity Tolerance Patient tolerated treatment well;No increased pain    Behavior During Therapy [facility] for tasks assessed/performed             Past Medical History:  Diagnosis Date   Anxiety    Chronic leg pain    Depression    Essential hypertension, benign 10/20/2015   Schizoaffective disorder [facility])     Past Surgical History:  Procedure Laterality Date   Block procedure at pain center  03/27/06   Bone graft from hip to jaw  2004   Bone graft right side of head to jaw  2006   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   Subjective Assessment - 09/29/20 1141     Subjective  Jeffrey Lin presents for OT  treatment visit 11/ 36 to address BLE lymphedema. He is un accompanied by today. Pt reports pain in RLE 4-5/10 at mid anterior tibia, described as a "stinging burning". Pt rates LLE pain as 3/10 this morning.    Patient is accompanied by: Family member    Pertinent History H&P relevant to BLE lymphedema:   tobacco abuse, HTN, hx BLE ulcers, hx  cellulitis, Hx DVT, Hx patellar fx, chronic BLE pain, chronic BLE lymphedema, neuropathy, Hx pneumonia, snoring. chronic diastolic heart failure, sinus tachycardia, chronic back pain, psychotic mood disorder/ Schitzoaffective disorder- depressive type, snoring    Limitations chronic leg pain and swelling, decreased balance,    Repetition Increases Symptoms    Special Tests Intake FOTO: 45/100    Patient Stated Goals get pain and swelling down in my legs    Pain Onset More than a month ago   approximately 4 years ago   Pain Onset More than a month ago                                  OT Education - 09/29/20 1142     Education Details Continued skilled Pt/caregiver education  And LE ADL training throughout visit for lymphedema self care/ home program, including compression wrapping, compression garment and device wear/care, lymphatic pumping ther ex, simple self-MLD, and skin care. Discussed initial volumetric measurements. Discussed all long term goals.    Person(s) Educated Patient;Parent(s)    Methods Explanation;Demonstration;Handout    Comprehension Verbalized understanding;Returned demonstration;Need further instruction                 OT Long  Term Goals - 09/27/20 1055       OT LONG TERM GOAL #1   Title Patient's intake functional score on the FOTO tool is 45 out of 100 (higher number = greater function). Given this patient's comorbidities she will experience at least an increase in function of 4 points, or higher, to increase level of independence with basic ADLs and functional ambulation and mobility.    Baseline 45/100    Time 12    Period Weeks    Status On-going   TBA next session due to time constraints     OT LONG TERM GOAL #2   Title Pt will be able to apply multi-layer, short stretch compression wraps to one leg at a time using correct gradient techniques with maximum caregiver assistance in an effort  to return the affected  limb(s)  to  premorbid size and shape, to limit pain and infection risk, and to improve functional mobility and ambulation  for ADLs.    Baseline dependent    Time 4    Period Days    Status Achieved   Goal met and exceeded. Pt independent with applying wraps and laundering them.     OT LONG TERM GOAL #3   Title Pt will achieve at least 10% limb volume reduction Bilaterally during Intensive Phase CDT to prevent re-accumulation of lymphatic congestion and progression of fibrosis, to limit infection risk, to improve functional ambulation and transfers, and to promote wound healing.    Baseline dependent    Time 12    Period Weeks    Status Partially Met   Met for LLE w/ 24.14% reduction measured 09/20/20.   Target Date 11/22/20      OT LONG TERM GOAL #4   Title With max caregiver assistance Pt will achieve and sustain a least 85% compliance with all LE self-care home program components throughout Intensive Phase CDT, including elevation when seated, daily skin inspection and care, lymphatic pumping ther ex, 23/7 compression wraps and simple self-MLD. Max caregiver assistance is necessary to ensure optimal limb volume reduction, to limit infection risk and to limit LE progression. Pt will arrange for assistance before we begin CDT.    Baseline dependent    Time 12    Period Weeks    Status Achieved   Met and exceeded. Pt remains 100% compliant with all LE self-care home program components     OT LONG TERM GOAL #5   Title Using assistive devices (modified independence) Pt will be able to don and doff appropriate daytime compression garments to limit lymphatic re-accumulation and LE progression before transitioning to self-management phase of CDT.Using assistive devices (modified independence) Pt will be able to don and doff appropriate daytime compression garments to limit lymphatic re-accumulation and LE progression before transitioning to self-management phase of CDT.    Baseline Max A    Time 12    Period  Weeks    Status On-going   LLE custom garment and HOS device ordered. Awaiting fitting.   Target Date 11/22/20                   Plan - 09/29/20 1137     Clinical Impression Statement Pt complaining of increased pain in RLE today, so we decided to provide MLD and to wrap the R as well as the L on a trial basis. Provided RLE MLD, skin care and knee length, multilayer, gradient compression wraps , as established. LLE wraps applied without chip and malleolus pads  to give skin a break.  Pt educated on precautions and instructed to remove all wrap if he should experience acute increase in le lower extremity pain and / or swelling and/ or atypical SOB.  Pt remains > 85 % compliant with home program with mother's assistance. Cont as per POC.    OT Occupational Profile and History Comprehensive Assessment- Review of records and extensive additional review of physical, cognitive, psychosocial history related to current functional performance    Occupational performance deficits (Please refer to evaluation for details): IADL's;ADL's;Rest and Sleep;Work;Leisure;Social Participation   w max CG assist for home program   Body Structure / Function / Physical Skills ADL;Edema;Skin integrity;Pain;Decreased knowledge of precautions;Decreased knowledge of use of DME;IADL    Rehab Potential Good    Clinical Decision Making Several treatment options, min-mod task modification necessary    Comorbidities Affecting Occupational Performance: Presence of comorbidities impacting occupational performance    Comorbidities impacting occupational performance description: suspected CVI, hx cellulitis, hx dvt    Modification or Assistance to Complete Evaluation  Min-Moderate modification of tasks or assist with assess necessary to complete eval    OT Frequency 2x / week    OT Duration 12 weeks   and PRN   OT Treatment/Interventions Self-care/ADL training;Therapeutic exercise;Manual Therapy;Coping strategies  training;Therapeutic activities;Manual lymph drainage;DME and/or AE instruction;Compression bandaging;Other (comment);Patient/family education   skin care with low ph castor oil and/ or eucerin lotion   Plan Intensive and Self-Management Phases of Complete Decongestive Therapy (CDT) to include manual lymphatic drainage, skin care, compression wraps and garments, therapeutic exercise, caregiver training    Recommended Other Services fit with BLE, knee length, custom compression garments and HOS devices needed to limit worsening fibrosis formation during HOS. Consider fitting with advanced, sequential , pneumatic compression device, or Flexitouch "pump" medically necessary for optimal level of independence with LE self-management at home over time    Consulted and Agree with Plan of Care Patient;Family member/caregiver             Patient will benefit from skilled therapeutic intervention in order to improve the following deficits and impairments:   Body Structure / Function / Physical Skills: ADL, Edema, Skin integrity, Pain, Decreased knowledge of precautions, Decreased knowledge of use of DME, IADL       Visit Diagnosis: Lymphedema, not elsewhere classified    Problem List Patient Active Problem List   Diagnosis Date Noted   Ulcers of both lower legs ([place]) 09/09/2019   Lymphedema of both lower extremities 09/09/2019   Sinus tachycardia 08/06/2019   Chronic diastolic heart failure (HCC)    Benign prostatic hyperplasia with lower urinary tract symptoms 07/02/2019   Snoring 07/02/2019   Cellulitis of left leg 05/15/2019   Left leg cellulitis 05/11/2019   Swelling abdomen 05/11/2019   Thiamine deficiency 12/28/2018   Pneumonia 03/28/2018   Subacute delirium 03/23/2018   Difficulty urinating 02/03/2018   Peripheral neuropathy 05/07/2017   Folate deficiency 05/07/2017   Pleural effusion, left 04/12/2016   Peripheral edema 02/29/2016   Hoarseness of voice 11/23/2015   Essential  hypertension, benign 10/20/2015   Schizoaffective disorder, depressive type ([place]) 02/17/2015   Vitamin D deficiency 02/14/2015   Low back pain 10/27/2014   Dyspnea on exertion 07/19/2014   Acute upper back pain 05/21/2013   Psychotic mood disorder ([place]) 05/14/2012   History of DVT (deep vein thrombosis) 07/06/2010   History of patellar fracture 07/06/2010   HYPERCHOLESTEROLEMIA 10/13/2009   PREDIABETES 10/13/2009   Anxiety state 06/02/2006   TOBACCO  ABUSE 06/02/2006   Chronic back pain 06/02/2006   Andrey Spearman, MS, OTR/L, [facility] 09/29/20 11:43 AM   [place] MAIN [facility] SERVICES [address] [place], [place], [address] Phone: [phone]   Fax:  [phone]  Name: Jeffrey Lin MRN: [id number] Date of Birth: [birth year]

## 2020-09-29 NOTE — Patient Instructions (Signed)

## 2020-10-04 ENCOUNTER — Ambulatory Visit: Payer: Medicare Other | Admitting: Occupational Therapy

## 2020-10-04 ENCOUNTER — Other Ambulatory Visit: Payer: Self-pay

## 2020-10-04 DIAGNOSIS — I890 Lymphedema, not elsewhere classified: Secondary | ICD-10-CM

## 2020-10-04 NOTE — Therapy (Signed)
[place] MAIN [facility] SERVICES [address] [place], [place], [address] Phone: [phone]   Fax:  [phone]  Occupational Therapy Treatment  Patient Details  Name: Jeffrey Lin MRN: [id number] Date of Birth: 03/09/1975 Referring Provider (OT): Jeri Cos, [place]   Encounter Date: 10/04/2020   OT End of Session - 10/04/20 1009     Visit Number 12    Number of Visits 36    Date for OT Re-Evaluation 11/21/20    Authorization Type 10th visit progress note completed 9/8/ 22 , Reporting Period 08/30/20- 09/27/20    OT Start Time 1000    OT Stop Time 1100    OT Time Calculation (min) 60 min    Activity Tolerance Patient tolerated treatment well;No increased pain    Behavior During Therapy [facility] for tasks assessed/performed             Past Medical History:  Diagnosis Date   Anxiety    Chronic leg pain    Depression    Essential hypertension, benign 10/20/2015   Schizoaffective disorder [facility])     Past Surgical History:  Procedure Laterality Date   Block procedure at pain center  03/27/06   Bone graft from hip to jaw  2004   Bone graft right side of head to jaw  2006   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   Subjective Assessment - 10/04/20 1010     Subjective  Jeffrey Lin presents for OT  treatment visit 12/ 36 to address BLE lymphedema. He is un accompanied by today. Pt reports 0/10 leg pain this mornomg/ Pt reports wrapping both legs last time was not a problem for him functionally, but he noticed increased swelling in his L foot and distal leg while the R seemed to reduce a little more.    Patient is accompanied by: Family  member    Pertinent History H&P relevant to BLE lymphedema:   tobacco abuse, HTN, hx BLE ulcers, hx cellulitis, Hx DVT, Hx patellar fx, chronic BLE pain, chronic BLE lymphedema, neuropathy, Hx pneumonia, snoring. chronic diastolic heart failure, sinus tachycardia, chronic back pain, psychotic mood disorder/ Schitzoaffective disorder- depressive type, snoring    Limitations chronic leg pain and swelling, decreased balance,    Repetition Increases Symptoms    Special Tests Intake FOTO: 45/100    Patient Stated Goals get pain and swelling down in my legs    Pain Onset More than a month ago   approximately 4 years ago   Pain Onset More than a month ago                          OT Treatments/Exercises (OP) - 10/04/20 1148       Manual Therapy   Manual Therapy Edema management    Manual therapy comments LLE skin ccare throughout MLD    Manual Lymphatic Drainage (MLD) MLD to LLE as established utilizing short neck sequence, deep abdominal breathing, functional inguinal LN, and proximal to distal J strokes and fibrosis techniques  from thigh to toes. Performed simultaneous skin care throughout MLD with low ph castor oil  Compression Bandaging BLE short stretch wraps applied from base of toes to tibial tuberosity. Omitted custom fabricated dorsal foot chip pad and lateral malleolus pads today.  Utilized 8. 10 and 12 cm short stretch bandages   layered over 0.4 cm thick Rosidal foam and cotton stockinett.                    OT Education - 10/04/20 1151     Education Details Continued skilled Pt/caregiver education  And LE ADL training throughout visit for lymphedema self care/ home program, including compression wrapping, compression garment and device wear/care, lymphatic pumping ther ex, simple self-MLD, and skin care. Discussed initial volumetric measurements. Discussed all long term goals.    Person(s) Educated Patient;Parent(s)    Methods  Explanation;Demonstration;Handout    Comprehension Verbalized understanding;Returned demonstration;Need further instruction                 OT Long Term Goals - 09/27/20 1055       OT LONG TERM GOAL #1   Title Patient's intake functional score on the FOTO tool is 45 out of 100 (higher number = greater function). Given this patient's comorbidities she will experience at least an increase in function of 4 points, or higher, to increase level of independence with basic ADLs and functional ambulation and mobility.    Baseline 45/100    Time 12    Period Weeks    Status On-going   TBA next session due to time constraints     OT LONG TERM GOAL #2   Title Pt will be able to apply multi-layer, short stretch compression wraps to one leg at a time using correct gradient techniques with maximum caregiver assistance in an effort  to return the affected  limb(s)  to premorbid size and shape, to limit pain and infection risk, and to improve functional mobility and ambulation  for ADLs.    Baseline dependent    Time 4    Period Days    Status Achieved   Goal met and exceeded. Pt independent with applying wraps and laundering them.     OT LONG TERM GOAL #3   Title Pt will achieve at least 10% limb volume reduction Bilaterally during Intensive Phase CDT to prevent re-accumulation of lymphatic congestion and progression of fibrosis, to limit infection risk, to improve functional ambulation and transfers, and to promote wound healing.    Baseline dependent    Time 12    Period Weeks    Status Partially Met   Met for LLE w/ 24.14% reduction measured 09/20/20.   Target Date 11/22/20      OT LONG TERM GOAL #4   Title With max caregiver assistance Pt will achieve and sustain a least 85% compliance with all LE self-care home program components throughout Intensive Phase CDT, including elevation when seated, daily skin inspection and care, lymphatic pumping ther ex, 23/7 compression wraps and simple  self-MLD. Max caregiver assistance is necessary to ensure optimal limb volume reduction, to limit infection risk and to limit LE progression. Pt will arrange for assistance before we begin CDT.    Baseline dependent    Time 12    Period Weeks    Status Achieved   Met and exceeded. Pt remains 100% compliant with all LE self-care home program components     OT LONG TERM GOAL #5   Title Using assistive devices (modified independence) Pt will be able to don and doff appropriate daytime compression garments to limit  lymphatic re-accumulation and LE progression before transitioning to self-management phase of CDT.Using assistive devices (modified independence) Pt will be able to don and doff appropriate daytime compression garments to limit lymphatic re-accumulation and LE progression before transitioning to self-management phase of CDT.    Baseline Max A    Time 12    Period Weeks    Status On-going   LLE custom garment and HOS device ordered. Awaiting fitting.   Target Date 11/22/20                   Plan - 10/04/20 1143     Clinical Impression Statement Pt had no difficulty tolerating BLE bandages below the knees during visit interval. He denies LOB, falls, dizziness and SOB. RLE is clearly reduced in voume this morning, but more reddened than typical. LLE appears completely decongested. Skin remains very dry bilaterally. Today is 1st day since commencing CDT that Pt denies leg pain. Provided BLE skin care to increase tissue hydration and skin stretch. Provided LLE MLD, and multilayer wraps bilaterally as established. Cont as per POC.    OT Occupational Profile and History Comprehensive Assessment- Review of records and extensive additional review of physical, cognitive, psychosocial history related to current functional performance    Occupational performance deficits (Please refer to evaluation for details): IADL's;ADL's;Rest and Sleep;Work;Leisure;Social Participation   w max CG assist  for home program   Body Structure / Function / Physical Skills ADL;Edema;Skin integrity;Pain;Decreased knowledge of precautions;Decreased knowledge of use of DME;IADL    Rehab Potential Good    Clinical Decision Making Several treatment options, min-mod task modification necessary    Comorbidities Affecting Occupational Performance: Presence of comorbidities impacting occupational performance    Comorbidities impacting occupational performance description: suspected CVI, hx cellulitis, hx dvt    Modification or Assistance to Complete Evaluation  Min-Moderate modification of tasks or assist with assess necessary to complete eval    OT Frequency 2x / week    OT Duration 12 weeks   and PRN   OT Treatment/Interventions Self-care/ADL training;Therapeutic exercise;Manual Therapy;Coping strategies training;Therapeutic activities;Manual lymph drainage;DME and/or AE instruction;Compression bandaging;Other (comment);Patient/family education   skin care with low ph castor oil and/ or eucerin lotion   Plan Intensive and Self-Management Phases of Complete Decongestive Therapy (CDT) to include manual lymphatic drainage, skin care, compression wraps and garments, therapeutic exercise, caregiver training    Recommended Other Services fit with BLE, knee length, custom compression garments and HOS devices needed to limit worsening fibrosis formation during HOS. Consider fitting with advanced, sequential , pneumatic compression device, or Flexitouch "pump" medically necessary for optimal level of independence with LE self-management at home over time    Consulted and Agree with Plan of Care Patient;Family member/caregiver             Patient will benefit from skilled therapeutic intervention in order to improve the following deficits and impairments:   Body Structure / Function / Physical Skills: ADL, Edema, Skin integrity, Pain, Decreased knowledge of precautions, Decreased knowledge of use of DME, IADL        Visit Diagnosis: Lymphedema, not elsewhere classified    Problem List Patient Active Problem List   Diagnosis Date Noted   Ulcers of both lower legs ([place]) 09/09/2019   Lymphedema of both lower extremities 09/09/2019   Sinus tachycardia 08/06/2019   Chronic diastolic heart failure (HCC)    Benign prostatic hyperplasia with lower urinary tract symptoms 07/02/2019   Snoring 07/02/2019   Cellulitis of left leg 05/15/2019  Left leg cellulitis 05/11/2019   Swelling abdomen 05/11/2019   Thiamine deficiency 12/28/2018   Pneumonia 03/28/2018   Subacute delirium 03/23/2018   Difficulty urinating 02/03/2018   Peripheral neuropathy 05/07/2017   Folate deficiency 05/07/2017   Pleural effusion, left 04/12/2016   Peripheral edema 02/29/2016   Hoarseness of voice 11/23/2015   Essential hypertension, benign 10/20/2015   Schizoaffective disorder, depressive type ([place]) 02/17/2015   Vitamin D deficiency 02/14/2015   Low back pain 10/27/2014   Dyspnea on exertion 07/19/2014   Acute upper back pain 05/21/2013   Psychotic mood disorder ([place]) 05/14/2012   History of DVT (deep vein thrombosis) 07/06/2010   History of patellar fracture 07/06/2010   HYPERCHOLESTEROLEMIA 10/13/2009   PREDIABETES 10/13/2009   Anxiety state 06/02/2006   TOBACCO ABUSE 06/02/2006   Chronic back pain 06/02/2006    Andrey Spearman, MS, OTR/L, [facility] 10/04/20 11:52 AM   [place] MAIN [facility] SERVICES [address] [place], [place], [address] Phone: [phone]   Fax:  [phone]  Name: Jeffrey Lin MRN: [id number] Date of Birth: [birth year]

## 2020-10-06 ENCOUNTER — Ambulatory Visit: Payer: Medicare Other | Admitting: Occupational Therapy

## 2020-10-06 ENCOUNTER — Other Ambulatory Visit: Payer: Self-pay

## 2020-10-06 ENCOUNTER — Other Ambulatory Visit: Payer: Self-pay | Admitting: Family Medicine

## 2020-10-06 DIAGNOSIS — I890 Lymphedema, not elsewhere classified: Secondary | ICD-10-CM | POA: Diagnosis not present

## 2020-10-06 MED ORDER — TIZANIDINE HCL 2 MG PO TABS: 2.0000 mg | ORAL_TABLET | Freq: Three times a day (TID) | ORAL | 0 refills | Status: DC | PRN

## 2020-10-06 NOTE — Therapy (Signed)
[place] MAIN [facility] SERVICES [address] [place], [place], [address] Phone: [phone]   Fax:  [phone]  Occupational Therapy Treatment  Patient Details  Name: Jeffrey Lin MRN: [id number] Date of Birth: 04-11-1975 Referring Provider (OT): Jeri Cos, [place]   Encounter Date: 10/06/2020   OT End of Session - 10/06/20 1006     Visit Number 13    Number of Visits 36    Date for OT Re-Evaluation 11/21/20    Authorization Type 10th visit progress note completed 9/8/ 22 , Reporting Period 08/30/20- 09/27/20    OT Start Time 1000    OT Stop Time 1105    OT Time Calculation (min) 65 min    Activity Tolerance Patient tolerated treatment well;No increased pain    Behavior During Therapy [facility] for tasks assessed/performed             Past Medical History:  Diagnosis Date   Anxiety    Chronic leg pain    Depression    Essential hypertension, benign 10/20/2015   Schizoaffective disorder [facility])     Past Surgical History:  Procedure Laterality Date   Block procedure at pain center  03/27/06   Bone graft from hip to jaw  2004   Bone graft right side of head to jaw  2006   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   Subjective Assessment - 10/06/20 1106     Subjective  Jeffrey Lin presents for OT  treatment visit 13/ 36 to address BLE lymphedema. He is un accompanied by today. Pt reports 0/10 leg pain this mornomg.    Patient is accompanied by: Family member    Pertinent History H&P relevant to BLE lymphedema:   tobacco abuse, HTN, hx BLE ulcers, hx cellulitis, Hx DVT, Hx patellar fx, chronic BLE pain, chronic BLE lymphedema, neuropathy,  Hx pneumonia, snoring. chronic diastolic heart failure, sinus tachycardia, chronic back pain, psychotic mood disorder/ Schitzoaffective disorder- depressive type, snoring    Limitations chronic leg pain and swelling, decreased balance,    Repetition Increases Symptoms    Special Tests Intake FOTO: 45/100    Patient Stated Goals get pain and swelling down in my legs    Pain Onset More than a month ago   approximately 4 years ago   Pain Onset More than a month ago                          OT Treatments/Exercises (OP) - 10/06/20 1107       ADLs   ADL Education Given Yes      Manual Therapy   Manual Therapy Edema management;Compression Bandaging;Manual Lymphatic Drainage (MLD)    Manual therapy comments LLE skin ccare throughout MLD    Manual Lymphatic Drainage (MLD) MLD to LLE as established utilizing short neck sequence, deep abdominal breathing, functional inguinal LN, and proximal to distal J strokes and fibrosis techniques  from thigh to toes. Performed simultaneous skin care throughout MLD with low ph castor oil    Compression Bandaging BLE short stretch wraps applied from base of toes to tibial tuberosity. Omitted custom fabricated dorsal foot  chip pad and lateral malleolus pads today.  Utilized 8. 10 and 12 cm short stretch bandages   layered over 0.4 cm thick Rosidal foam and cotton stockinett.                    OT Education - 10/06/20 1108     Education Details Continued skilled Pt/caregiver education  And LE ADL training throughout visit for lymphedema self care/ home program, including compression wrapping, compression garment and device wear/care, lymphatic pumping ther ex, simple self-MLD, and skin care. Discussed initial volumetric measurements. Discussed all long term goals.    Person(s) Educated Patient;Parent(s)    Methods Explanation;Demonstration;Handout    Comprehension Verbalized understanding;Returned demonstration;Need further instruction                  OT Long Term Goals - 09/27/20 1055       OT LONG TERM GOAL #1   Title Patient's intake functional score on the FOTO tool is 45 out of 100 (higher number = greater function). Given this patient's comorbidities she will experience at least an increase in function of 4 points, or higher, to increase level of independence with basic ADLs and functional ambulation and mobility.    Baseline 45/100    Time 12    Period Weeks    Status On-going   TBA next session due to time constraints     OT LONG TERM GOAL #2   Title Pt will be able to apply multi-layer, short stretch compression wraps to one leg at a time using correct gradient techniques with maximum caregiver assistance in an effort  to return the affected  limb(s)  to premorbid size and shape, to limit pain and infection risk, and to improve functional mobility and ambulation  for ADLs.    Baseline dependent    Time 4    Period Days    Status Achieved   Goal met and exceeded. Pt independent with applying wraps and laundering them.     OT LONG TERM GOAL #3   Title Pt will achieve at least 10% limb volume reduction Bilaterally during Intensive Phase CDT to prevent re-accumulation of lymphatic congestion and progression of fibrosis, to limit infection risk, to improve functional ambulation and transfers, and to promote wound healing.    Baseline dependent    Time 12    Period Weeks    Status Partially Met   Met for LLE w/ 24.14% reduction measured 09/20/20.   Target Date 11/22/20      OT LONG TERM GOAL #4   Title With max caregiver assistance Pt will achieve and sustain a least 85% compliance with all LE self-care home program components throughout Intensive Phase CDT, including elevation when seated, daily skin inspection and care, lymphatic pumping ther ex, 23/7 compression wraps and simple self-MLD. Max caregiver assistance is necessary to ensure optimal limb volume reduction, to limit infection risk and to limit LE  progression. Pt will arrange for assistance before we begin CDT.    Baseline dependent    Time 12    Period Weeks    Status Achieved   Met and exceeded. Pt remains 100% compliant with all LE self-care home program components     OT LONG TERM GOAL #5   Title Using assistive devices (modified independence) Pt will be able to don and doff appropriate daytime compression garments to limit lymphatic re-accumulation and LE progression before transitioning to self-management phase of CDT.Using assistive devices (modified independence) Pt will be  able to don and doff appropriate daytime compression garments to limit lymphatic re-accumulation and LE progression before transitioning to self-management phase of CDT.    Baseline Max A    Time 12    Period Weeks    Status On-going   LLE custom garment and HOS device ordered. Awaiting fitting.   Target Date 11/22/20                   Plan - 10/06/20 1105     Clinical Impression Statement Provided LLE MLD, skin care and compression therapy  without increased pain. Applied gradient compression wraps bilaterally with supplemental malleoli pads. Pt remains > 85 % compliant with home program with mother's assistance. Cont as per POC.    OT Occupational Profile and History Comprehensive Assessment- Review of records and extensive additional review of physical, cognitive, psychosocial history related to current functional performance    Occupational performance deficits (Please refer to evaluation for details): IADL's;ADL's;Rest and Sleep;Work;Leisure;Social Participation   w max CG assist for home program   Body Structure / Function / Physical Skills ADL;Edema;Skin integrity;Pain;Decreased knowledge of precautions;Decreased knowledge of use of DME;IADL    Rehab Potential Good    Clinical Decision Making Several treatment options, min-mod task modification necessary    Comorbidities Affecting Occupational Performance: Presence of comorbidities impacting  occupational performance    Comorbidities impacting occupational performance description: suspected CVI, hx cellulitis, hx dvt    Modification or Assistance to Complete Evaluation  Min-Moderate modification of tasks or assist with assess necessary to complete eval    OT Frequency 2x / week    OT Duration 12 weeks   and PRN   OT Treatment/Interventions Self-care/ADL training;Therapeutic exercise;Manual Therapy;Coping strategies training;Therapeutic activities;Manual lymph drainage;DME and/or AE instruction;Compression bandaging;Other (comment);Patient/family education   skin care with low ph castor oil and/ or eucerin lotion   Plan Intensive and Self-Management Phases of Complete Decongestive Therapy (CDT) to include manual lymphatic drainage, skin care, compression wraps and garments, therapeutic exercise, caregiver training    Recommended Other Services fit with BLE, knee length, custom compression garments and HOS devices needed to limit worsening fibrosis formation during HOS. Consider fitting with advanced, sequential , pneumatic compression device, or Flexitouch "pump" medically necessary for optimal level of independence with LE self-management at home over time    Consulted and Agree with Plan of Care Patient;Family member/caregiver             Patient will benefit from skilled therapeutic intervention in order to improve the following deficits and impairments:   Body Structure / Function / Physical Skills: ADL, Edema, Skin integrity, Pain, Decreased knowledge of precautions, Decreased knowledge of use of DME, IADL       Visit Diagnosis: Lymphedema, not elsewhere classified    Problem List Patient Active Problem List   Diagnosis Date Noted   Ulcers of both lower legs ([place]) 09/09/2019   Lymphedema of both lower extremities 09/09/2019   Sinus tachycardia 08/06/2019   Chronic diastolic heart failure (HCC)    Benign prostatic hyperplasia with lower urinary tract symptoms  07/02/2019   Snoring 07/02/2019   Cellulitis of left leg 05/15/2019   Left leg cellulitis 05/11/2019   Swelling abdomen 05/11/2019   Thiamine deficiency 12/28/2018   Pneumonia 03/28/2018   Subacute delirium 03/23/2018   Difficulty urinating 02/03/2018   Peripheral neuropathy 05/07/2017   Folate deficiency 05/07/2017   Pleural effusion, left 04/12/2016   Peripheral edema 02/29/2016   Hoarseness of voice 11/23/2015   Essential hypertension,  benign 10/20/2015   Schizoaffective disorder, depressive type ([place]) 02/17/2015   Vitamin D deficiency 02/14/2015   Low back pain 10/27/2014   Dyspnea on exertion 07/19/2014   Acute upper back pain 05/21/2013   Psychotic mood disorder ([place]) 05/14/2012   History of DVT (deep vein thrombosis) 07/06/2010   History of patellar fracture 07/06/2010   HYPERCHOLESTEROLEMIA 10/13/2009   PREDIABETES 10/13/2009   Anxiety state 06/02/2006   TOBACCO ABUSE 06/02/2006   Chronic back pain 06/02/2006   Andrey Spearman, MS, OTR/L, [facility] 10/06/20 11:09 AM   [place] MAIN [facility] SERVICES [place], [place], [address] Phone: [phone]   Fax:  [phone]  Name: Jeffrey Lin MRN: [id number] Date of Birth: Dec 06, 1975

## 2020-10-06 NOTE — Telephone Encounter (Signed)
Last office visit 03/24/20 for Leg/Foot Swelling.  Last refilled 06/29/20 for #270 with no refills.  No future appointments with PCP.

## 2020-10-11 ENCOUNTER — Other Ambulatory Visit: Payer: Self-pay

## 2020-10-11 ENCOUNTER — Ambulatory Visit: Payer: Medicare Other | Admitting: Occupational Therapy

## 2020-10-11 DIAGNOSIS — I890 Lymphedema, not elsewhere classified: Secondary | ICD-10-CM

## 2020-10-11 NOTE — Therapy (Signed)
[place] MAIN [facility] SERVICES [address] [place], [place], [address] Phone: [phone]   Fax:  [phone]  Occupational Therapy Treatment  Patient Details  Name: Jeffrey Lin MRN: [id number] Date of Birth: 10-19-1975 Referring Provider (OT): Jeri Cos, [place]   Encounter Date: 10/11/2020   OT End of Session - 10/11/20 1023     Visit Number 14    Number of Visits 52    Date for OT Re-Evaluation 11/21/20    Authorization Type 10th visit progress note completed 9/8/ 22 , Reporting Period 08/30/20- 09/27/20    OT Start Time 1010    OT Stop Time 1110    OT Time Calculation (min) 60 min    Activity Tolerance Patient tolerated treatment well;No increased pain    Behavior During Therapy [facility] for tasks assessed/performed             Past Medical History:  Diagnosis Date   Anxiety    Chronic leg pain    Depression    Essential hypertension, benign 10/20/2015   Schizoaffective disorder [facility])     Past Surgical History:  Procedure Laterality Date   Block procedure at pain center  03/27/06   Bone graft from hip to jaw  2004   Bone graft right side of head to jaw  2006   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   Subjective Assessment - 10/11/20 1023     Subjective  Jeffrey Lin presents for OT  treatment visit 13/ 36 to address BLE lymphedema. He is un accompanied by today. Pt reports 0/10 leg pain this mornomg.    Patient is accompanied by: Family member    Pertinent History H&P relevant to BLE lymphedema:   tobacco abuse, HTN, hx BLE ulcers, hx cellulitis, Hx DVT, Hx patellar fx, chronic BLE pain, chronic BLE lymphedema, neuropathy,  Hx pneumonia, snoring. chronic diastolic heart failure, sinus tachycardia, chronic back pain, psychotic mood disorder/ Schitzoaffective disorder- depressive type, snoring    Limitations chronic leg pain and swelling, decreased balance,    Repetition Increases Symptoms    Special Tests Intake FOTO: 45/100    Patient Stated Goals get pain and swelling down in my legs    Pain Onset More than a month ago   approximately 4 years ago   Pain Onset More than a month ago                          OT Treatments/Exercises (OP) - 10/11/20 1244       ADLs   ADL Education Given Yes      Manual Therapy   Manual Therapy Edema management;Compression Bandaging;Manual Lymphatic Drainage (MLD)    Manual therapy comments LLE skin ccare throughout MLD    Manual Lymphatic Drainage (MLD) MLD to LLE as established utilizing short neck sequence, deep abdominal breathing, functional inguinal LN, and proximal to distal J strokes and fibrosis techniques  from thigh to toes. Performed simultaneous skin care throughout MLD with low ph castor oil    Compression Bandaging BLE short stretch wraps applied from base of toes to tibial tuberosity. Omitted custom fabricated dorsal foot  chip pad and lateral malleolus pads today.  Utilized 8. 10 and 12 cm short stretch bandages   layered over 0.4 cm thick Rosidal foam and cotton stockinett.                    OT Education - 10/11/20 1245     Education Details Continued skilled Pt/caregiver education  And LE ADL training throughout visit for lymphedema self care/ home program, including compression wrapping, compression garment and device wear/care, lymphatic pumping ther ex, simple self-MLD, and skin care. Discussed initial volumetric measurements. Discussed all long term goals.    Person(s) Educated Patient;Parent(s)    Methods Explanation;Demonstration;Handout    Comprehension Verbalized understanding;Returned demonstration;Need further instruction                  OT Long Term Goals - 09/27/20 1055       OT LONG TERM GOAL #1   Title Patient's intake functional score on the FOTO tool is 45 out of 100 (higher number = greater function). Given this patient's comorbidities she will experience at least an increase in function of 4 points, or higher, to increase level of independence with basic ADLs and functional ambulation and mobility.    Baseline 45/100    Time 12    Period Weeks    Status On-going   TBA next session due to time constraints     OT LONG TERM GOAL #2   Title Pt will be able to apply multi-layer, short stretch compression wraps to one leg at a time using correct gradient techniques with maximum caregiver assistance in an effort  to return the affected  limb(s)  to premorbid size and shape, to limit pain and infection risk, and to improve functional mobility and ambulation  for ADLs.    Baseline dependent    Time 4    Period Days    Status Achieved   Goal met and exceeded. Pt independent with applying wraps and laundering them.     OT LONG TERM GOAL #3   Title Pt will achieve at least 10% limb volume reduction Bilaterally during Intensive Phase CDT to prevent re-accumulation of lymphatic congestion and progression of fibrosis, to limit infection risk, to improve functional ambulation and transfers, and to promote wound healing.    Baseline dependent    Time 12    Period Weeks    Status Partially Met   Met for LLE w/ 24.14% reduction measured 09/20/20.   Target Date 11/22/20      OT LONG TERM GOAL #4   Title With max caregiver assistance Pt will achieve and sustain a least 85% compliance with all LE self-care home program components throughout Intensive Phase CDT, including elevation when seated, daily skin inspection and care, lymphatic pumping ther ex, 23/7 compression wraps and simple self-MLD. Max caregiver assistance is necessary to ensure optimal limb volume reduction, to limit infection risk and to limit LE  progression. Pt will arrange for assistance before we begin CDT.    Baseline dependent    Time 12    Period Weeks    Status Achieved   Met and exceeded. Pt remains 100% compliant with all LE self-care home program components     OT LONG TERM GOAL #5   Title Using assistive devices (modified independence) Pt will be able to don and doff appropriate daytime compression garments to limit lymphatic re-accumulation and LE progression before transitioning to self-management phase of CDT.Using assistive devices (modified independence) Pt will be  able to don and doff appropriate daytime compression garments to limit lymphatic re-accumulation and LE progression before transitioning to self-management phase of CDT.    Baseline Max A    Time 12    Period Weeks    Status On-going   LLE custom garment and HOS device ordered. Awaiting fitting.   Target Date 11/22/20                   Plan - 10/11/20 1241     Clinical Impression Statement LLE limb volume and redness decreased slightly after MLD. Pt tolerated manual therapy and reapplication of BLE knee length wraps as established without pain. Pt continues to demonstrate progress towards all OT goals. Cont as per POC.    OT Occupational Profile and History Comprehensive Assessment- Review of records and extensive additional review of physical, cognitive, psychosocial history related to current functional performance    Occupational performance deficits (Please refer to evaluation for details): IADL's;ADL's;Rest and Sleep;Work;Leisure;Social Participation   w max CG assist for home program   Body Structure / Function / Physical Skills ADL;Edema;Skin integrity;Pain;Decreased knowledge of precautions;Decreased knowledge of use of DME;IADL    Rehab Potential Good    Clinical Decision Making Several treatment options, min-mod task modification necessary    Comorbidities Affecting Occupational Performance: Presence of comorbidities impacting occupational  performance    Comorbidities impacting occupational performance description: suspected CVI, hx cellulitis, hx dvt    Modification or Assistance to Complete Evaluation  Min-Moderate modification of tasks or assist with assess necessary to complete eval    OT Frequency 2x / week    OT Duration 12 weeks   and PRN   OT Treatment/Interventions Self-care/ADL training;Therapeutic exercise;Manual Therapy;Coping strategies training;Therapeutic activities;Manual lymph drainage;DME and/or AE instruction;Compression bandaging;Other (comment);Patient/family education   skin care with low ph castor oil and/ or eucerin lotion   Plan Intensive and Self-Management Phases of Complete Decongestive Therapy (CDT) to include manual lymphatic drainage, skin care, compression wraps and garments, therapeutic exercise, caregiver training    Recommended Other Services fit with BLE, knee length, custom compression garments and HOS devices needed to limit worsening fibrosis formation during HOS. Consider fitting with advanced, sequential , pneumatic compression device, or Flexitouch "pump" medically necessary for optimal level of independence with LE self-management at home over time    Consulted and Agree with Plan of Care Patient;Family member/caregiver             Patient will benefit from skilled therapeutic intervention in order to improve the following deficits and impairments:   Body Structure / Function / Physical Skills: ADL, Edema, Skin integrity, Pain, Decreased knowledge of precautions, Decreased knowledge of use of DME, IADL       Visit Diagnosis: Lymphedema, not elsewhere classified    Problem List Patient Active Problem List   Diagnosis Date Noted   Ulcers of both lower legs ([place]) 09/09/2019   Lymphedema of both lower extremities 09/09/2019   Sinus tachycardia 08/06/2019   Chronic diastolic heart failure (HCC)    Benign prostatic hyperplasia with lower urinary tract symptoms 07/02/2019   Snoring  07/02/2019   Cellulitis of left leg 05/15/2019   Left leg cellulitis 05/11/2019   Swelling abdomen 05/11/2019   Thiamine deficiency 12/28/2018   Pneumonia 03/28/2018   Subacute delirium 03/23/2018   Difficulty urinating 02/03/2018   Peripheral neuropathy 05/07/2017   Folate deficiency 05/07/2017   Pleural effusion, left 04/12/2016   Peripheral edema 02/29/2016   Hoarseness of voice 11/23/2015   Essential hypertension,  benign 10/20/2015   Schizoaffective disorder, depressive type ([place]) 02/17/2015   Vitamin D deficiency 02/14/2015   Low back pain 10/27/2014   Dyspnea on exertion 07/19/2014   Acute upper back pain 05/21/2013   Psychotic mood disorder ([place]) 05/14/2012   History of DVT (deep vein thrombosis) 07/06/2010   History of patellar fracture 07/06/2010   HYPERCHOLESTEROLEMIA 10/13/2009   PREDIABETES 10/13/2009   Anxiety state 06/02/2006   TOBACCO ABUSE 06/02/2006   Chronic back pain 06/02/2006   Andrey Spearman, MS, OTR/L, CLT-LANA 10/11/20 12:46 PM   [place] MAIN [facility] SERVICES [address] [place], [place], [address] Phone: [phone]   Fax:  [phone]  Name: DAISHAUN AYRE MRN: [id number] Date of Birth: 03/27/1975

## 2020-10-13 ENCOUNTER — Other Ambulatory Visit: Payer: Self-pay

## 2020-10-13 ENCOUNTER — Ambulatory Visit: Payer: Medicare Other | Admitting: Occupational Therapy

## 2020-10-13 DIAGNOSIS — I890 Lymphedema, not elsewhere classified: Secondary | ICD-10-CM

## 2020-10-13 NOTE — Therapy (Signed)
[place] MAIN [facility] SERVICES [address] [place], [place], [address] Phone: [phone]   Fax:  [phone]  Occupational Therapy Treatment  Patient Details  Name: Jeffrey Lin MRN: [id number] Date of Birth: [birth year] Referring Provider (OT): Jeri Cos, [place]   Encounter Date: 10/13/2020   OT End of Session - 10/13/20 1018     Visit Number 15    Number of Visits 36    Date for OT Re-Evaluation 11/21/20    Authorization Type 10th visit progress note completed 9/8/ 22 , Reporting Period 08/30/20- 09/27/20    OT Start Time 1010    OT Stop Time 1107    OT Time Calculation (min) 57 min    Activity Tolerance Patient tolerated treatment well;No increased pain    Behavior During Therapy [facility] for tasks assessed/performed             Past Medical History:  Diagnosis Date   Anxiety    Chronic leg pain    Depression    Essential hypertension, benign 10/20/2015   Schizoaffective disorder [facility])     Past Surgical History:  Procedure Laterality Date   Block procedure at pain center  03/27/06   Bone graft from hip to jaw  2004   Bone graft right side of head to jaw  2006   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   Subjective Assessment - 10/13/20 1019     Subjective  Jeffrey Lin presents for OT  treatment visit 15/ 36 to address BLE lymphedema. He is un accompanied by today. Pt reports 1/10 leg pain this morning. Pt describes pain symptoms as "neurological.    Patient is accompanied by: Family member    Pertinent History H&P relevant to BLE lymphedema:   tobacco abuse, HTN, hx BLE ulcers, hx cellulitis, Hx DVT, Hx patellar fx, chronic  BLE pain, chronic BLE lymphedema, neuropathy, Hx pneumonia, snoring. chronic diastolic heart failure, sinus tachycardia, chronic back pain, psychotic mood disorder/ Schitzoaffective disorder- depressive type, snoring    Limitations chronic leg pain and swelling, decreased balance,    Repetition Increases Symptoms    Special Tests Intake FOTO: 45/100    Patient Stated Goals get pain and swelling down in my legs    Currently in Pain? Yes    Pain Onset More than a month ago   approximately 4 years ago   Pain Onset More than a month ago                                  OT Education - 10/13/20 1107     Education Details Continued skilled Pt/caregiver education  And LE ADL training throughout visit for lymphedema self care/ home program, including compression wrapping, compression garment and device wear/care, lymphatic pumping ther ex, simple self-MLD, and skin care. Discussed initial volumetric measurements. Discussed all long term goals.    Person(s) Educated Patient;Parent(s)    Methods Explanation;Demonstration;Handout    Comprehension Verbalized understanding;Returned demonstration;Need further instruction                 OT Long Term Goals -  09/27/20 1055       OT LONG TERM GOAL #1   Title Patient's intake functional score on the FOTO tool is 45 out of 100 (higher number = greater function). Given this patient's comorbidities she will experience at least an increase in function of 4 points, or higher, to increase level of independence with basic ADLs and functional ambulation and mobility.    Baseline 45/100    Time 12    Period Weeks    Status On-going   TBA next session due to time constraints     OT LONG TERM GOAL #2   Title Pt will be able to apply multi-layer, short stretch compression wraps to one leg at a time using correct gradient techniques with maximum caregiver assistance in an effort  to return the affected  limb(s)  to premorbid size and  shape, to limit pain and infection risk, and to improve functional mobility and ambulation  for ADLs.    Baseline dependent    Time 4    Period Days    Status Achieved   Goal met and exceeded. Pt independent with applying wraps and laundering them.     OT LONG TERM GOAL #3   Title Pt will achieve at least 10% limb volume reduction Bilaterally during Intensive Phase CDT to prevent re-accumulation of lymphatic congestion and progression of fibrosis, to limit infection risk, to improve functional ambulation and transfers, and to promote wound healing.    Baseline dependent    Time 12    Period Weeks    Status Partially Met   Met for LLE w/ 24.14% reduction measured 09/20/20.   Target Date 11/22/20      OT LONG TERM GOAL #4   Title With max caregiver assistance Pt will achieve and sustain a least 85% compliance with all LE self-care home program components throughout Intensive Phase CDT, including elevation when seated, daily skin inspection and care, lymphatic pumping ther ex, 23/7 compression wraps and simple self-MLD. Max caregiver assistance is necessary to ensure optimal limb volume reduction, to limit infection risk and to limit LE progression. Pt will arrange for assistance before we begin CDT.    Baseline dependent    Time 12    Period Weeks    Status Achieved   Met and exceeded. Pt remains 100% compliant with all LE self-care home program components     OT LONG TERM GOAL #5   Title Using assistive devices (modified independence) Pt will be able to don and doff appropriate daytime compression garments to limit lymphatic re-accumulation and LE progression before transitioning to self-management phase of CDT.Using assistive devices (modified independence) Pt will be able to don and doff appropriate daytime compression garments to limit lymphatic re-accumulation and LE progression before transitioning to self-management phase of CDT.    Baseline Max A    Time 12    Period Weeks    Status  On-going   LLE custom garment and HOS device ordered. Awaiting fitting.   Target Date 11/22/20                   Plan - 10/13/20 1106     Clinical Impression Statement Provided LLE MLD, skin care and compression therapy  without increased pain. Applied gradient compression wraps bilaterally with supplemental malleoli pads. Pt remains > 85 % compliant with home program with mother's assistance. Cont as per POC.    OT Occupational Profile and History Comprehensive Assessment- Review of records and extensive additional review  of physical, cognitive, psychosocial history related to current functional performance    Occupational performance deficits (Please refer to evaluation for details): IADL's;ADL's;Rest and Sleep;Work;Leisure;Social Participation   w max CG assist for home program   Body Structure / Function / Physical Skills ADL;Edema;Skin integrity;Pain;Decreased knowledge of precautions;Decreased knowledge of use of DME;IADL    Rehab Potential Good    Clinical Decision Making Several treatment options, min-mod task modification necessary    Comorbidities Affecting Occupational Performance: Presence of comorbidities impacting occupational performance    Comorbidities impacting occupational performance description: suspected CVI, hx cellulitis, hx dvt    Modification or Assistance to Complete Evaluation  Min-Moderate modification of tasks or assist with assess necessary to complete eval    OT Frequency 2x / week    OT Duration 12 weeks   and PRN   OT Treatment/Interventions Self-care/ADL training;Therapeutic exercise;Manual Therapy;Coping strategies training;Therapeutic activities;Manual lymph drainage;DME and/or AE instruction;Compression bandaging;Other (comment);Patient/family education   skin care with low ph castor oil and/ or eucerin lotion   Plan Intensive and Self-Management Phases of Complete Decongestive Therapy (CDT) to include manual lymphatic drainage, skin care, compression  wraps and garments, therapeutic exercise, caregiver training    Recommended Other Services fit with BLE, knee length, custom compression garments and HOS devices needed to limit worsening fibrosis formation during HOS. Consider fitting with advanced, sequential , pneumatic compression device, or Flexitouch "pump" medically necessary for optimal level of independence with LE self-management at home over time    Consulted and Agree with Plan of Care Patient;Family member/caregiver             Patient will benefit from skilled therapeutic intervention in order to improve the following deficits and impairments:   Body Structure / Function / Physical Skills: ADL, Edema, Skin integrity, Pain, Decreased knowledge of precautions, Decreased knowledge of use of DME, IADL       Visit Diagnosis: Lymphedema, not elsewhere classified    Problem List Patient Active Problem List   Diagnosis Date Noted   Ulcers of both lower legs ([place]) 09/09/2019   Lymphedema of both lower extremities 09/09/2019   Sinus tachycardia 08/06/2019   Chronic diastolic heart failure (HCC)    Benign prostatic hyperplasia with lower urinary tract symptoms 07/02/2019   Snoring 07/02/2019   Cellulitis of left leg 05/15/2019   Left leg cellulitis 05/11/2019   Swelling abdomen 05/11/2019   Thiamine deficiency 12/28/2018   Pneumonia 03/28/2018   Subacute delirium 03/23/2018   Difficulty urinating 02/03/2018   Peripheral neuropathy 05/07/2017   Folate deficiency 05/07/2017   Pleural effusion, left 04/12/2016   Peripheral edema 02/29/2016   Hoarseness of voice 11/23/2015   Essential hypertension, benign 10/20/2015   Schizoaffective disorder, depressive type ([place]) 02/17/2015   Vitamin D deficiency 02/14/2015   Low back pain 10/27/2014   Dyspnea on exertion 07/19/2014   Acute upper back pain 05/21/2013   Psychotic mood disorder ([place]) 05/14/2012   History of DVT (deep vein thrombosis) 07/06/2010   History of patellar  fracture 07/06/2010   HYPERCHOLESTEROLEMIA 10/13/2009   PREDIABETES 10/13/2009   Anxiety state 06/02/2006   TOBACCO ABUSE 06/02/2006   Chronic back pain 06/02/2006    Andrey Spearman, MS, OTR/L, [facility] 10/13/20 11:09 AM   [place] MAIN [facility] SERVICES [address] [place], [place], [address] Phone: [phone]   Fax:  [phone]  Name: MORIS RATCHFORD MRN: [id number] Date of Birth: 1975-05-12

## 2020-10-18 ENCOUNTER — Other Ambulatory Visit: Payer: Self-pay

## 2020-10-18 ENCOUNTER — Ambulatory Visit: Payer: Medicare Other | Admitting: Occupational Therapy

## 2020-10-18 DIAGNOSIS — I890 Lymphedema, not elsewhere classified: Secondary | ICD-10-CM

## 2020-10-18 NOTE — Therapy (Signed)
[place] MAIN [facility] SERVICES [address] [place], [place], [address] Phone: [phone]   Fax:  [phone]  Occupational Therapy Treatment  Patient Details  Name: Jeffrey Lin MRN: [id number] Date of Birth: 1975-09-30 Referring Provider (OT): Jeri Cos, [place]   Encounter Date: 10/18/2020   OT End of Session - 10/18/20 1032     Visit Number 16    Number of Visits 36    Date for OT Re-Evaluation 11/21/20    Authorization Type 10th visit progress note completed 9/8/ 22 , Reporting Period 08/30/20- 09/27/20    OT Start Time 1000    OT Stop Time 1100    OT Time Calculation (min) 60 min    Activity Tolerance Patient tolerated treatment well;No increased pain    Behavior During Therapy [facility] for tasks assessed/performed             Past Medical History:  Diagnosis Date   Anxiety    Chronic leg pain    Depression    Essential hypertension, benign 10/20/2015   Schizoaffective disorder [facility])     Past Surgical History:  Procedure Laterality Date   Block procedure at pain center  03/27/06   Bone graft from hip to jaw  2004   Bone graft right side of head to jaw  2006   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   Subjective Assessment - 10/18/20 1241     Subjective  Jeffrey Lin presents for OT  treatment visit 16/ 36 to address BLE lymphedema. He is un accompanied by today. Pt reports 1/10 leg pain this morning. Pt denies new complaints    Patient is accompanied by: Family member    Pertinent History H&P relevant to BLE lymphedema:   tobacco abuse, HTN, hx BLE ulcers, hx cellulitis, Hx DVT, Hx patellar fx, chronic BLE pain, chronic BLE  lymphedema, neuropathy, Hx pneumonia, snoring. chronic diastolic heart failure, sinus tachycardia, chronic back pain, psychotic mood disorder/ Schitzoaffective disorder- depressive type, snoring    Limitations chronic leg pain and swelling, decreased balance,    Repetition Increases Symptoms    Special Tests Intake FOTO: 45/100    Patient Stated Goals get pain and swelling down in my legs    Pain Onset More than a month ago   approximately 4 years ago   Pain Onset More than a month ago                          OT Treatments/Exercises (OP) - 10/18/20 1242       ADLs   ADL Education Given Yes      Manual Therapy   Manual Therapy Edema management;Compression Bandaging    Manual therapy comments skin care w castor oil to limit dryness    Edema Management fitted LLE custom knee high. Fit looks excellent initially. Pt will wash and wear during visit interval and we'll finalized functional assessment next visit. Completed anatomical measurements for RLE custom compression stocking- Elvarex SOFT, ccl 3, open toe, silicone band on oblique top band.    Compression Bandaging custom ccl 3 garment to LLE. gradient wraps to RLE as  established.                    OT Education - 10/18/20 1246     Education Details Pt edu for donning/ doffing custom compression garments using assistive devices, and training for garment wear , laundry and precautions. Discussed typical 3-6 month replacement schedule.    Person(s) Educated Patient;Parent(s)    Methods Explanation;Demonstration;Handout    Comprehension Verbalized understanding;Returned demonstration;Need further instruction                 OT Long Term Goals - 09/27/20 1055       OT LONG TERM GOAL #1   Title Patient's intake functional score on the FOTO tool is 45 out of 100 (higher number = greater function). Given this patient's comorbidities she will experience at least an increase in function of 4 points, or  higher, to increase level of independence with basic ADLs and functional ambulation and mobility.    Baseline 45/100    Time 12    Period Weeks    Status On-going   TBA next session due to time constraints     OT LONG TERM GOAL #2   Title Pt will be able to apply multi-layer, short stretch compression wraps to one leg at a time using correct gradient techniques with maximum caregiver assistance in an effort  to return the affected  limb(s)  to premorbid size and shape, to limit pain and infection risk, and to improve functional mobility and ambulation  for ADLs.    Baseline dependent    Time 4    Period Days    Status Achieved   Goal met and exceeded. Pt independent with applying wraps and laundering them.     OT LONG TERM GOAL #3   Title Pt will achieve at least 10% limb volume reduction Bilaterally during Intensive Phase CDT to prevent re-accumulation of lymphatic congestion and progression of fibrosis, to limit infection risk, to improve functional ambulation and transfers, and to promote wound healing.    Baseline dependent    Time 12    Period Weeks    Status Partially Met   Met for LLE w/ 24.14% reduction measured 09/20/20.   Target Date 11/22/20      OT LONG TERM GOAL #4   Title With max caregiver assistance Pt will achieve and sustain a least 85% compliance with all LE self-care home program components throughout Intensive Phase CDT, including elevation when seated, daily skin inspection and care, lymphatic pumping ther ex, 23/7 compression wraps and simple self-MLD. Max caregiver assistance is necessary to ensure optimal limb volume reduction, to limit infection risk and to limit LE progression. Pt will arrange for assistance before we begin CDT.    Baseline dependent    Time 12    Period Weeks    Status Achieved   Met and exceeded. Pt remains 100% compliant with all LE self-care home program components     OT LONG TERM GOAL #5   Title Using assistive devices (modified  independence) Pt will be able to don and doff appropriate daytime compression garments to limit lymphatic re-accumulation and LE progression before transitioning to self-management phase of CDT.Using assistive devices (modified independence) Pt will be able to don and doff appropriate daytime compression garments to limit lymphatic re-accumulation and LE progression before transitioning to self-management phase of CDT.    Baseline Max A    Time 12    Period Weeks    Status On-going  LLE custom garment and HOS device ordered. Awaiting fitting.   Target Date 11/22/20                   Plan - 10/18/20 1234     Clinical Impression Statement Completed custom compression garment fitting for LLE- knee length, Elvarex SOFT, ccl 3, w/ open toe and 2.5 cm silicone band at oblique top edge. Pt able to donn the garment with modified independence using assistive friction gloves and Tyvek slipper after skilled edu. Pt verbalized understanding of garment care and wear instructions and precautions. Manufacturer provides handout as well. Completed RLE anatomical measurements for custom compression stocking with same specifications,  Applied gradient compression wraps to RLE and Pt wore LLE stocking home. Cont as per POC.    OT Occupational Profile and History Comprehensive Assessment- Review of records and extensive additional review of physical, cognitive, psychosocial history related to current functional performance    Occupational performance deficits (Please refer to evaluation for details): IADL's;ADL's;Rest and Sleep;Work;Leisure;Social Participation   w max CG assist for home program   Body Structure / Function / Physical Skills ADL;Edema;Skin integrity;Pain;Decreased knowledge of precautions;Decreased knowledge of use of DME;IADL    Rehab Potential Good    Clinical Decision Making Several treatment options, min-mod task modification necessary    Comorbidities Affecting Occupational Performance:  Presence of comorbidities impacting occupational performance    Comorbidities impacting occupational performance description: suspected CVI, hx cellulitis, hx dvt    Modification or Assistance to Complete Evaluation  Min-Moderate modification of tasks or assist with assess necessary to complete eval    OT Frequency 2x / week    OT Duration 12 weeks   and PRN   OT Treatment/Interventions Self-care/ADL training;Therapeutic exercise;Manual Therapy;Coping strategies training;Therapeutic activities;Manual lymph drainage;DME and/or AE instruction;Compression bandaging;Other (comment);Patient/family education   skin care with low ph castor oil and/ or eucerin lotion   Plan Intensive and Self-Management Phases of Complete Decongestive Therapy (CDT) to include manual lymphatic drainage, skin care, compression wraps and garments, therapeutic exercise, caregiver training    Recommended Other Services fit with BLE, knee length, custom compression garments and HOS devices needed to limit worsening fibrosis formation during HOS. Consider fitting with advanced, sequential , pneumatic compression device, or Flexitouch "pump" medically necessary for optimal level of independence with LE self-management at home over time    Consulted and Agree with Plan of Care Patient;Family member/caregiver             Patient will benefit from skilled therapeutic intervention in order to improve the following deficits and impairments:   Body Structure / Function / Physical Skills: ADL, Edema, Skin integrity, Pain, Decreased knowledge of precautions, Decreased knowledge of use of DME, IADL       Visit Diagnosis: Lymphedema, not elsewhere classified    Problem List Patient Active Problem List   Diagnosis Date Noted   Ulcers of both lower legs ([place]) 09/09/2019   Lymphedema of both lower extremities 09/09/2019   Sinus tachycardia 08/06/2019   Chronic diastolic heart failure (HCC)    Benign prostatic hyperplasia with  lower urinary tract symptoms 07/02/2019   Snoring 07/02/2019   Cellulitis of left leg 05/15/2019   Left leg cellulitis 05/11/2019   Swelling abdomen 05/11/2019   Thiamine deficiency 12/28/2018   Pneumonia 03/28/2018   Subacute delirium 03/23/2018   Difficulty urinating 02/03/2018   Peripheral neuropathy 05/07/2017   Folate deficiency 05/07/2017   Pleural effusion, left 04/12/2016   Peripheral edema 02/29/2016   Hoarseness of voice  11/23/2015   Essential hypertension, benign 10/20/2015   Schizoaffective disorder, depressive type ([place]) 02/17/2015   Vitamin D deficiency 02/14/2015   Low back pain 10/27/2014   Dyspnea on exertion 07/19/2014   Acute upper back pain 05/21/2013   Psychotic mood disorder ([place]) 05/14/2012   History of DVT (deep vein thrombosis) 07/06/2010   History of patellar fracture 07/06/2010   HYPERCHOLESTEROLEMIA 10/13/2009   PREDIABETES 10/13/2009   Anxiety state 06/02/2006   TOBACCO ABUSE 06/02/2006   Chronic back pain 06/02/2006    Andrey Spearman, MS, OTR/L, CLT-LANA 10/18/20 12:49 PM   [place] MAIN [facility] SERVICES [address] [place], [place], [address] Phone: [phone]   Fax:  [phone]  Name: RAFEL GARDE MRN: [id number] Date of Birth: Oct 22, 1975

## 2020-10-18 NOTE — Patient Instructions (Signed)

## 2020-10-20 ENCOUNTER — Other Ambulatory Visit: Payer: Self-pay

## 2020-10-20 ENCOUNTER — Ambulatory Visit: Payer: Medicare Other | Admitting: Occupational Therapy

## 2020-10-20 DIAGNOSIS — I890 Lymphedema, not elsewhere classified: Secondary | ICD-10-CM | POA: Diagnosis not present

## 2020-10-20 NOTE — Therapy (Signed)
[place] MAIN [facility] SERVICES [address] [place], [place], [address] Phone: [phone]   Fax:  [phone]  Occupational Therapy Treatment  Patient Details  Name: Jeffrey Lin MRN: [id number] Date of Birth: 04-Nov-1975 Referring Provider (OT): Jeri Cos, [place]   Encounter Date: 10/20/2020   OT End of Session - 10/20/20 1015     Visit Number 17    Number of Visits 36    Date for OT Re-Evaluation 11/21/20    Authorization Type 10th visit progress note completed 9/8/ 22 , Reporting Period 08/30/20- 09/27/20    OT Start Time 1000    OT Stop Time 1100    OT Time Calculation (min) 60 min    Activity Tolerance Patient tolerated treatment well;No increased pain    Behavior During Therapy [facility] for tasks assessed/performed             Past Medical History:  Diagnosis Date   Anxiety    Chronic leg pain    Depression    Essential hypertension, benign 10/20/2015   Schizoaffective disorder [facility])     Past Surgical History:  Procedure Laterality Date   Block procedure at pain center  03/27/06   Bone graft from hip to jaw  2004   Bone graft right side of head to jaw  2006   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   Subjective Assessment - 10/20/20 1016     Subjective  Jeffrey Lin presents for OT  treatment visit 16/ 36 to address BLE lymphedema. He is un accompanied by today. Pt reports 1/10 leg pain this morning. Pt denies new complaints    Patient is accompanied by: Family member    Pertinent History H&P relevant to BLE lymphedema:   tobacco abuse, HTN, hx BLE ulcers, hx cellulitis, Hx DVT, Hx patellar fx, chronic BLE pain, chronic BLE  lymphedema, neuropathy, Hx pneumonia, snoring. chronic diastolic heart failure, sinus tachycardia, chronic back pain, psychotic mood disorder/ Schitzoaffective disorder- depressive type, snoring    Limitations chronic leg pain and swelling, decreased balance,    Repetition Increases Symptoms    Special Tests Intake FOTO: 45/100    Patient Stated Goals get pain and swelling down in my legs    Pain Onset More than a month ago   approximately 4 years ago   Pain Onset More than a month ago                          OT Treatments/Exercises (OP) - 10/20/20 1115       ADLs   ADL Education Given Yes      Manual Therapy   Manual Therapy Edema management;Compression Bandaging    Manual therapy comments skin care w castor oil to limit dryness    Compression Bandaging custom ccl 3 garment to LLE. gradient wraps to RLE as established.                    OT Education - 10/20/20 1115     Education Details Continued skilled Pt/caregiver education  And LE ADL training throughout visit for lymphedema self care/ home program, including  compression wrapping, compression garment and device wear/care, lymphatic pumping ther ex, simple self-MLD, and skin care. Discussed initial volumetric measurements. Discussed all long term goals.    Person(s) Educated Patient;Parent(s)    Methods Explanation;Demonstration;Handout    Comprehension Verbalized understanding;Returned demonstration;Need further instruction                 OT Long Term Goals - 09/27/20 1055       OT LONG TERM GOAL #1   Title Patient's intake functional score on the FOTO tool is 45 out of 100 (higher number = greater function). Given this patient's comorbidities she will experience at least an increase in function of 4 points, or higher, to increase level of independence with basic ADLs and functional ambulation and mobility.    Baseline 45/100    Time 12    Period Weeks    Status On-going   TBA next  session due to time constraints     OT LONG TERM GOAL #2   Title Pt will be able to apply multi-layer, short stretch compression wraps to one leg at a time using correct gradient techniques with maximum caregiver assistance in an effort  to return the affected  limb(s)  to premorbid size and shape, to limit pain and infection risk, and to improve functional mobility and ambulation  for ADLs.    Baseline dependent    Time 4    Period Days    Status Achieved   Goal met and exceeded. Pt independent with applying wraps and laundering them.     OT LONG TERM GOAL #3   Title Pt will achieve at least 10% limb volume reduction Bilaterally during Intensive Phase CDT to prevent re-accumulation of lymphatic congestion and progression of fibrosis, to limit infection risk, to improve functional ambulation and transfers, and to promote wound healing.    Baseline dependent    Time 12    Period Weeks    Status Partially Met   Met for LLE w/ 24.14% reduction measured 09/20/20.   Target Date 11/22/20      OT LONG TERM GOAL #4   Title With max caregiver assistance Pt will achieve and sustain a least 85% compliance with all LE self-care home program components throughout Intensive Phase CDT, including elevation when seated, daily skin inspection and care, lymphatic pumping ther ex, 23/7 compression wraps and simple self-MLD. Max caregiver assistance is necessary to ensure optimal limb volume reduction, to limit infection risk and to limit LE progression. Pt will arrange for assistance before we begin CDT.    Baseline dependent    Time 12    Period Weeks    Status Achieved   Met and exceeded. Pt remains 100% compliant with all LE self-care home program components     OT LONG TERM GOAL #5   Title Using assistive devices (modified independence) Pt will be able to don and doff appropriate daytime compression garments to limit lymphatic re-accumulation and LE progression before transitioning to self-management phase  of CDT.Using assistive devices (modified independence) Pt will be able to don and doff appropriate daytime compression garments to limit lymphatic re-accumulation and LE progression before transitioning to self-management phase of CDT.    Baseline Max A    Time 12    Period Weeks    Status On-going   LLE custom garment and HOS device ordered. Awaiting fitting.   Target Date 11/22/20  Plan - 10/20/20 1112     Clinical Impression Statement Finalized LLE custom garment fitting. Garment managing swelling well with flat knit, soft fabric, ccl 3. Pt is able to don and doff independently and is comfortable and compliant with wear and care schedule. Pt tolerated RLE MLD, skin care and gradient wrapping today withoutr pain. Skin continues to present as dry and flaking despite frequent skin care. Pt continues to meak steady progress towards all OT goals. Cont as per POC    OT Occupational Profile and History Comprehensive Assessment- Review of records and extensive additional review of physical, cognitive, psychosocial history related to current functional performance    Occupational performance deficits (Please refer to evaluation for details): IADL's;ADL's;Rest and Sleep;Work;Leisure;Social Participation   w max CG assist for home program   Body Structure / Function / Physical Skills ADL;Edema;Skin integrity;Pain;Decreased knowledge of precautions;Decreased knowledge of use of DME;IADL    Rehab Potential Good    Clinical Decision Making Several treatment options, min-mod task modification necessary    Comorbidities Affecting Occupational Performance: Presence of comorbidities impacting occupational performance    Comorbidities impacting occupational performance description: suspected CVI, hx cellulitis, hx dvt    Modification or Assistance to Complete Evaluation  Min-Moderate modification of tasks or assist with assess necessary to complete eval    OT Frequency 2x / week    OT  Duration 12 weeks   and PRN   OT Treatment/Interventions Self-care/ADL training;Therapeutic exercise;Manual Therapy;Coping strategies training;Therapeutic activities;Manual lymph drainage;DME and/or AE instruction;Compression bandaging;Other (comment);Patient/family education   skin care with low ph castor oil and/ or eucerin lotion   Plan Intensive and Self-Management Phases of Complete Decongestive Therapy (CDT) to include manual lymphatic drainage, skin care, compression wraps and garments, therapeutic exercise, caregiver training    Recommended Other Services fit with BLE, knee length, custom compression garments and HOS devices needed to limit worsening fibrosis formation during HOS. Consider fitting with advanced, sequential , pneumatic compression device, or Flexitouch "pump" medically necessary for optimal level of independence with LE self-management at home over time    Consulted and Agree with Plan of Care Patient;Family member/caregiver             Patient will benefit from skilled therapeutic intervention in order to improve the following deficits and impairments:   Body Structure / Function / Physical Skills: ADL, Edema, Skin integrity, Pain, Decreased knowledge of precautions, Decreased knowledge of use of DME, IADL       Visit Diagnosis: Lymphedema, not elsewhere classified    Problem List Patient Active Problem List   Diagnosis Date Noted   Ulcers of both lower legs ([place]) 09/09/2019   Lymphedema of both lower extremities 09/09/2019   Sinus tachycardia 08/06/2019   Chronic diastolic heart failure (HCC)    Benign prostatic hyperplasia with lower urinary tract symptoms 07/02/2019   Snoring 07/02/2019   Cellulitis of left leg 05/15/2019   Left leg cellulitis 05/11/2019   Swelling abdomen 05/11/2019   Thiamine deficiency 12/28/2018   Pneumonia 03/28/2018   Subacute delirium 03/23/2018   Difficulty urinating 02/03/2018   Peripheral neuropathy 05/07/2017   Folate  deficiency 05/07/2017   Pleural effusion, left 04/12/2016   Peripheral edema 02/29/2016   Hoarseness of voice 11/23/2015   Essential hypertension, benign 10/20/2015   Schizoaffective disorder, depressive type ([place]) 02/17/2015   Vitamin D deficiency 02/14/2015   Low back pain 10/27/2014   Dyspnea on exertion 07/19/2014   Acute upper back pain 05/21/2013   Psychotic mood disorder ([place]) 05/14/2012  History of DVT (deep vein thrombosis) 07/06/2010   History of patellar fracture 07/06/2010   HYPERCHOLESTEROLEMIA 10/13/2009   PREDIABETES 10/13/2009   Anxiety state 06/02/2006   TOBACCO ABUSE 06/02/2006   Chronic back pain 06/02/2006   Andrey Spearman, MS, OTR/L, [facility] 10/20/20 11:16 AM    [place] MAIN [facility] SERVICES [place], [place], [address] Phone: [phone]   Fax:  [phone]  Name: Jeffrey Lin MRN: [id number] Date of Birth: 1975/04/03

## 2020-10-20 NOTE — Patient Instructions (Signed)

## 2020-10-25 ENCOUNTER — Other Ambulatory Visit: Payer: Self-pay

## 2020-10-25 ENCOUNTER — Ambulatory Visit: Payer: Medicare Other | Attending: Physician Assistant | Admitting: Occupational Therapy

## 2020-10-25 DIAGNOSIS — I890 Lymphedema, not elsewhere classified: Secondary | ICD-10-CM | POA: Diagnosis not present

## 2020-10-25 NOTE — Therapy (Signed)
[place] MAIN [facility] SERVICES [address] [place], [place], [address] Phone: [phone]   Fax:  [phone]  Occupational Therapy Treatment  Patient Details  Name: Jeffrey Lin MRN: [id number] Date of Birth: 1975/08/21 Referring Provider (OT): Jeri Cos, [place]   Encounter Date: 10/25/2020   OT End of Session - 10/25/20 1001     Visit Number 18    Number of Visits 36    Date for OT Re-Evaluation 11/21/20    Authorization Type 10th visit progress note completed 9/8/ 22 , Reporting Period 08/30/20- 09/27/20    OT Start Time 1001    OT Stop Time 1105    OT Time Calculation (min) 64 min    Equipment Utilized During Treatment Jobst RELAX HOS device sample    Activity Tolerance Patient tolerated treatment well;No increased pain    Behavior During Therapy [facility] for tasks assessed/performed             Past Medical History:  Diagnosis Date   Anxiety    Chronic leg pain    Depression    Essential hypertension, benign 10/20/2015   Schizoaffective disorder [facility])     Past Surgical History:  Procedure Laterality Date   Block procedure at pain center  03/27/06   Bone graft from hip to jaw  2004   Bone graft right side of head to jaw  2006   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   Subjective Assessment - 10/25/20 1007     Subjective  Jeffrey Lin presents for OT  treatment visit 16/ 36 to address BLE lymphedema. He is un accompanied by today. Pt reports 1/10 leg pain this morning. Pt denies new complaints    Patient is accompanied by: Family member    Pertinent History H&P relevant to BLE lymphedema:   tobacco abuse, HTN, hx BLE  ulcers, hx cellulitis, Hx DVT, Hx patellar fx, chronic BLE pain, chronic BLE lymphedema, neuropathy, Hx pneumonia, snoring. chronic diastolic heart failure, sinus tachycardia, chronic back pain, psychotic mood disorder/ Schitzoaffective disorder- depressive type, snoring    Limitations chronic leg pain and swelling, decreased balance,    Repetition Increases Symptoms    Special Tests Intake FOTO: 45/100    Patient Stated Goals get pain and swelling down in my legs    Pain Onset More than a month ago   approximately 4 years ago   Pain Onset More than a month ago                                  OT Education - 10/25/20 1115     Education Details Pt edu re benefits of HOS device.    Person(s) Educated Patient;Parent(s)    Methods Explanation;Demonstration;Handout    Comprehension Verbalized understanding;Returned demonstration;Need further instruction                 OT Long Term Goals - 09/27/20 1055       OT LONG TERM GOAL #1   Title Patient's intake functional score on the FOTO tool is 45 out of 100 (higher number = greater  function). Given this patient's comorbidities she will experience at least an increase in function of 4 points, or higher, to increase level of independence with basic ADLs and functional ambulation and mobility.    Baseline 45/100    Time 12    Period Weeks    Status On-going   TBA next session due to time constraints     OT LONG TERM GOAL #2   Title Pt will be able to apply multi-layer, short stretch compression wraps to one leg at a time using correct gradient techniques with maximum caregiver assistance in an effort  to return the affected  limb(s)  to premorbid size and shape, to limit pain and infection risk, and to improve functional mobility and ambulation  for ADLs.    Baseline dependent    Time 4    Period Days    Status Achieved   Goal met and exceeded. Pt independent with applying wraps and laundering them.     OT LONG  TERM GOAL #3   Title Pt will achieve at least 10% limb volume reduction Bilaterally during Intensive Phase CDT to prevent re-accumulation of lymphatic congestion and progression of fibrosis, to limit infection risk, to improve functional ambulation and transfers, and to promote wound healing.    Baseline dependent    Time 12    Period Weeks    Status Partially Met   Met for LLE w/ 24.14% reduction measured 09/20/20.   Target Date 11/22/20      OT LONG TERM GOAL #4   Title With max caregiver assistance Pt will achieve and sustain a least 85% compliance with all LE self-care home program components throughout Intensive Phase CDT, including elevation when seated, daily skin inspection and care, lymphatic pumping ther ex, 23/7 compression wraps and simple self-MLD. Max caregiver assistance is necessary to ensure optimal limb volume reduction, to limit infection risk and to limit LE progression. Pt will arrange for assistance before we begin CDT.    Baseline dependent    Time 12    Period Weeks    Status Achieved   Met and exceeded. Pt remains 100% compliant with all LE self-care home program components     OT LONG TERM GOAL #5   Title Using assistive devices (modified independence) Pt will be able to don and doff appropriate daytime compression garments to limit lymphatic re-accumulation and LE progression before transitioning to self-management phase of CDT.Using assistive devices (modified independence) Pt will be able to don and doff appropriate daytime compression garments to limit lymphatic re-accumulation and LE progression before transitioning to self-management phase of CDT.    Baseline Max A    Time 12    Period Weeks    Status On-going   LLE custom garment and HOS device ordered. Awaiting fitting.   Target Date 11/22/20                   Plan - 10/25/20 1110     Clinical Impression Statement Pt tolerated LLE MLD, skin care and gradient wrapping today without pain. Provided Pt  edu re HOS devices, namely the Jobst RELAX. Pt educated re rational for the device, benfits for limiting fibrosis to limit progression. Demonstrated function with sample. Applied multilayer compression wraps with gradient techniques to RLE below the knee. Pt donned LLE custom stocking independently after manual therapy. Cont as per POC.    OT Occupational Profile and History Comprehensive Assessment- Review of records and extensive additional review of physical, cognitive, psychosocial history  related to current functional performance    Occupational performance deficits (Please refer to evaluation for details): IADL's;ADL's;Rest and Sleep;Work;Leisure;Social Participation   w max CG assist for home program   Body Structure / Function / Physical Skills ADL;Edema;Skin integrity;Pain;Decreased knowledge of precautions;Decreased knowledge of use of DME;IADL    Rehab Potential Good    Clinical Decision Making Several treatment options, min-mod task modification necessary    Comorbidities Affecting Occupational Performance: Presence of comorbidities impacting occupational performance    Comorbidities impacting occupational performance description: suspected CVI, hx cellulitis, hx dvt    Modification or Assistance to Complete Evaluation  Min-Moderate modification of tasks or assist with assess necessary to complete eval    OT Frequency 2x / week    OT Duration 12 weeks   and PRN   OT Treatment/Interventions Self-care/ADL training;Therapeutic exercise;Manual Therapy;Coping strategies training;Therapeutic activities;Manual lymph drainage;DME and/or AE instruction;Compression bandaging;Other (comment);Patient/family education   skin care with low ph castor oil and/ or eucerin lotion   Plan Intensive and Self-Management Phases of Complete Decongestive Therapy (CDT) to include manual lymphatic drainage, skin care, compression wraps and garments, therapeutic exercise, caregiver training    Recommended Other  Services fit with BLE, knee length, custom compression garments and HOS devices needed to limit worsening fibrosis formation during HOS. Consider fitting with advanced, sequential , pneumatic compression device, or Flexitouch "pump" medically necessary for optimal level of independence with LE self-management at home over time    Consulted and Agree with Plan of Care Patient;Family member/caregiver             Patient will benefit from skilled therapeutic intervention in order to improve the following deficits and impairments:   Body Structure / Function / Physical Skills: ADL, Edema, Skin integrity, Pain, Decreased knowledge of precautions, Decreased knowledge of use of DME, IADL       Visit Diagnosis: Lymphedema, not elsewhere classified    Problem List Patient Active Problem List   Diagnosis Date Noted   Ulcers of both lower legs ([place]) 09/09/2019   Lymphedema of both lower extremities 09/09/2019   Sinus tachycardia 08/06/2019   Chronic diastolic heart failure (HCC)    Benign prostatic hyperplasia with lower urinary tract symptoms 07/02/2019   Snoring 07/02/2019   Cellulitis of left leg 05/15/2019   Left leg cellulitis 05/11/2019   Swelling abdomen 05/11/2019   Thiamine deficiency 12/28/2018   Pneumonia 03/28/2018   Subacute delirium 03/23/2018   Difficulty urinating 02/03/2018   Peripheral neuropathy 05/07/2017   Folate deficiency 05/07/2017   Pleural effusion, left 04/12/2016   Peripheral edema 02/29/2016   Hoarseness of voice 11/23/2015   Essential hypertension, benign 10/20/2015   Schizoaffective disorder, depressive type ([place]) 02/17/2015   Vitamin D deficiency 02/14/2015   Low back pain 10/27/2014   Dyspnea on exertion 07/19/2014   Acute upper back pain 05/21/2013   Psychotic mood disorder ([place]) 05/14/2012   History of DVT (deep vein thrombosis) 07/06/2010   History of patellar fracture 07/06/2010   HYPERCHOLESTEROLEMIA 10/13/2009   PREDIABETES 10/13/2009    Anxiety state 06/02/2006   TOBACCO ABUSE 06/02/2006   Chronic back pain 06/02/2006   Andrey Spearman, MS, OTR/L, [facility] 10/25/20 11:17 AM   [facility] MAIN [facility] SERVICES [address] [place], [place], [address] Phone: [phone]   Fax:  [phone]  Name: Jeffrey Lin MRN: [id number] Date of Birth: 1975/12/14

## 2020-10-27 ENCOUNTER — Ambulatory Visit: Payer: Medicare Other | Admitting: Occupational Therapy

## 2020-10-27 ENCOUNTER — Other Ambulatory Visit: Payer: Self-pay

## 2020-10-27 DIAGNOSIS — I890 Lymphedema, not elsewhere classified: Secondary | ICD-10-CM | POA: Diagnosis not present

## 2020-10-27 NOTE — Therapy (Signed)
[place] MAIN [facility] SERVICES [address] [place], [place], [address] Phone: [phone]   Fax:  [phone]  Occupational Therapy Treatment  Patient Details  Name: Jeffrey Lin MRN: [id number] Date of Birth: 28-Jul-1975 Referring Provider (OT): Jeri Cos, [place]   Encounter Date: 10/27/2020   OT End of Session - 10/27/20 1023     Visit Number 19    Number of Visits 37    Date for OT Re-Evaluation 11/21/20    Authorization Type 10th visit progress note completed 9/8/ 22 , Reporting Period 08/30/20- 09/27/20    OT Start Time 1005    Equipment Utilized During Treatment Jobst RELAX HOS device sample    Activity Tolerance Patient tolerated treatment well;No increased pain    Behavior During Therapy 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   Subjective Assessment - 10/27/20 1023     Subjective  Jeffrey Lin presents for OT  treatment visit 19/ 36 to address BLE lymphedema. He is un accompanied by today. Pt reports 0/10 leg pain this morning. Pt denies new complaints.    Patient is accompanied by: Family member    Pertinent History H&P relevant to BLE lymphedema:   tobacco abuse, HTN, hx BLE ulcers, hx cellulitis, Hx DVT, Hx patellar fx, chronic BLE pain,  chronic BLE lymphedema, neuropathy, Hx pneumonia, snoring. chronic diastolic heart failure, sinus tachycardia, chronic back pain, psychotic mood disorder/ Schitzoaffective disorder- depressive type, snoring    Limitations chronic leg pain and swelling, decreased balance,    Repetition Increases Symptoms    Special Tests Intake FOTO: 45/100    Patient Stated Goals get pain and swelling down in my legs    Pain Onset More than a month ago   approximately 4 years ago   Pain Onset More than a month ago                 LYMPHEDEMA/ONCOLOGY QUESTIONNAIRE - 10/27/20 1058       Lymphedema Assessments   Lymphedema Assessments Lower extremities      Right Lower Extremity Lymphedema   Other RLE limb volume from ankle to tibial tuberosity is DECcreased by 5.3 %.    Other Visit 19: L leg (Rx)  limb volume ankle to popliteal fossa (A-D) measures 4052.4 ml.      Left Lower Extremity Lymphedema   Other Visit 19: L leg (Rx)  limb volume ankle to popliteal fossa (A-D) measures 4323.5 ml.    Other LLE limb volume from ankle to tibial tuberosity is INcreased by 2.1%.  OT Treatments/Exercises (OP) - 10/27/20 1056       ADLs   ADL Education Given Yes      Manual Therapy   Manual Therapy Edema management;Compression Bandaging    Edema Management BLE comparative limb volumetrics    Compression Bandaging custom ccl 3 garment to LLE. gradient wraps to RLE as established.                    OT Education - 10/27/20 1103     Education Details Continued skilled Pt/caregiver education  And LE ADL training throughout visit for lymphedema self care/ home program, including compression wrapping, compression garment and device wear/care, lymphatic pumping ther ex, simple self-MLD, and skin care. Discussed initial volumetric measurements. Discussed all long term goals.    Person(s) Educated Patient;Parent(s)    Methods Explanation;Demonstration;Handout     Comprehension Verbalized understanding;Returned demonstration;Need further instruction                 OT Long Term Goals - 09/27/20 1055       OT LONG TERM GOAL #1   Title Patient's intake functional score on the FOTO tool is 45 out of 100 (higher number = greater function). Given this patient's comorbidities she will experience at least an increase in function of 4 points, or higher, to increase level of independence with basic ADLs and functional ambulation and mobility.    Baseline 45/100    Time 12    Period Weeks    Status On-going   TBA next session due to time constraints     OT LONG TERM GOAL #2   Title Pt will be able to apply multi-layer, short stretch compression wraps to one leg at a time using correct gradient techniques with maximum caregiver assistance in an effort  to return the affected  limb(s)  to premorbid size and shape, to limit pain and infection risk, and to improve functional mobility and ambulation  for ADLs.    Baseline dependent    Time 4    Period Days    Status Achieved   Goal met and exceeded. Pt independent with applying wraps and laundering them.     OT LONG TERM GOAL #3   Title Pt will achieve at least 10% limb volume reduction Bilaterally during Intensive Phase CDT to prevent re-accumulation of lymphatic congestion and progression of fibrosis, to limit infection risk, to improve functional ambulation and transfers, and to promote wound healing.    Baseline dependent    Time 12    Period Weeks    Status Partially Met   Met for LLE w/ 24.14% reduction measured 09/20/20.   Target Date 11/22/20      OT LONG TERM GOAL #4   Title With max caregiver assistance Pt will achieve and sustain a least 85% compliance with all LE self-care home program components throughout Intensive Phase CDT, including elevation when seated, daily skin inspection and care, lymphatic pumping ther ex, 23/7 compression wraps and simple self-MLD. Max caregiver assistance is  necessary to ensure optimal limb volume reduction, to limit infection risk and to limit LE progression. Pt will arrange for assistance before we begin CDT.    Baseline dependent    Time 12    Period Weeks    Status Achieved   Met and exceeded. Pt remains 100% compliant with all LE self-care home program components     OT LONG TERM GOAL #5   Title Using assistive devices (modified independence) Pt will be  able to don and doff appropriate daytime compression garments to limit lymphatic re-accumulation and LE progression before transitioning to self-management phase of CDT.Using assistive devices (modified independence) Pt will be able to don and doff appropriate daytime compression garments to limit lymphatic re-accumulation and LE progression before transitioning to self-management phase of CDT.    Baseline Max A    Time 12    Period Weeks    Status On-going   LLE custom garment and HOS device ordered. Awaiting fitting.   Target Date 11/22/20                   Plan - 10/27/20 1103     Clinical Impression Statement Completed BLE comparative limb volumetrics in prep for progress report. LLE below the knee is up slightly, 2.1%, since last visit, which I expect is typical fluid fluctuation. RLE is decreased by 5.3% since commencing CDT. Reapplied compression wraps as established to RLE. Pt donned LLE knee high compression stocking independently. Cont as per POC..Completed    OT Occupational Profile and History Comprehensive Assessment- Review of records and extensive additional review of physical, cognitive, psychosocial history related to current functional performance    Occupational performance deficits (Please refer to evaluation for details): IADL's;ADL's;Rest and Sleep;Work;Leisure;Social Participation   w max CG assist for home program   Body Structure / Function / Physical Skills ADL;Edema;Skin integrity;Pain;Decreased knowledge of precautions;Decreased knowledge of use of DME;IADL     Rehab Potential Good    Clinical Decision Making Several treatment options, min-mod task modification necessary    Comorbidities Affecting Occupational Performance: Presence of comorbidities impacting occupational performance    Comorbidities impacting occupational performance description: suspected CVI, hx cellulitis, hx dvt    Modification or Assistance to Complete Evaluation  Min-Moderate modification of tasks or assist with assess necessary to complete eval    OT Frequency 2x / week    OT Duration 12 weeks   and PRN   OT Treatment/Interventions Self-care/ADL training;Therapeutic exercise;Manual Therapy;Coping strategies training;Therapeutic activities;Manual lymph drainage;DME and/or AE instruction;Compression bandaging;Other (comment);Patient/family education   skin care with low ph castor oil and/ or eucerin lotion   Plan Intensive and Self-Management Phases of Complete Decongestive Therapy (CDT) to include manual lymphatic drainage, skin care, compression wraps and garments, therapeutic exercise, caregiver training    Recommended Other Services fit with BLE, knee length, custom compression garments and HOS devices needed to limit worsening fibrosis formation during HOS. Consider fitting with advanced, sequential , pneumatic compression device, or Flexitouch "pump" medically necessary for optimal level of independence with LE self-management at home over time    Consulted and Agree with Plan of Care Patient;Family member/caregiver             Patient will benefit from skilled therapeutic intervention in order to improve the following deficits and impairments:   Body Structure / Function / Physical Skills: ADL, Edema, Skin integrity, Pain, Decreased knowledge of precautions, Decreased knowledge of use of DME, IADL       Visit Diagnosis: Lymphedema, not elsewhere classified    Problem List Patient Active Problem List   Diagnosis Date Noted   Ulcers of both lower legs ([place])  09/09/2019   Lymphedema of both lower extremities 09/09/2019   Sinus tachycardia 08/06/2019   Chronic diastolic heart failure (HCC)    Benign prostatic hyperplasia with lower urinary tract symptoms 07/02/2019   Snoring 07/02/2019   Cellulitis of left leg 05/15/2019   Left leg cellulitis 05/11/2019   Swelling abdomen 05/11/2019  Thiamine deficiency 12/28/2018   Pneumonia 03/28/2018   Subacute delirium 03/23/2018   Difficulty urinating 02/03/2018   Peripheral neuropathy 05/07/2017   Folate deficiency 05/07/2017   Pleural effusion, left 04/12/2016   Peripheral edema 02/29/2016   Hoarseness of voice 11/23/2015   Essential hypertension, benign 10/20/2015   Schizoaffective disorder, depressive type ([place]) 02/17/2015   Vitamin D deficiency 02/14/2015   Low back pain 10/27/2014   Dyspnea on exertion 07/19/2014   Acute upper back pain 05/21/2013   Psychotic mood disorder ([place]) 05/14/2012   History of DVT (deep vein thrombosis) 07/06/2010   History of patellar fracture 07/06/2010   HYPERCHOLESTEROLEMIA 10/13/2009   PREDIABETES 10/13/2009   Anxiety state 06/02/2006   TOBACCO ABUSE 06/02/2006   Chronic back pain 06/02/2006   Andrey Spearman, MS, OTR/L, [facility] 10/27/20 11:06 AM   [place] MAIN [facility] SERVICES [address] [place], [place], [address] Phone: [phone]   Fax:  [phone]  Name: Jeffrey Lin MRN: [id number] Date of Birth: 10/28/1975

## 2020-10-27 NOTE — Patient Instructions (Signed)
Self

## 2020-11-01 ENCOUNTER — Other Ambulatory Visit: Payer: Self-pay

## 2020-11-01 ENCOUNTER — Ambulatory Visit: Payer: Medicare Other | Admitting: Occupational Therapy

## 2020-11-01 DIAGNOSIS — I890 Lymphedema, not elsewhere classified: Secondary | ICD-10-CM | POA: Diagnosis not present

## 2020-11-01 NOTE — Patient Instructions (Signed)

## 2020-11-01 NOTE — Therapy (Addendum)
[place] MAIN [facility] SERVICES [address] [place], [place], [address] Phone: [phone]   Fax:  [phone]  Occupational Therapy Treatment Note and Progress Report  Patient Details  Name: Jeffrey Lin MRN: [id number] Date of Birth: January 03, 1976 Referring Provider (OT): Jeri Cos, PA-C  Reporting Period: 09/27/20 - 11/01/20   Encounter Date: 11/01/2020   OT End of Session - 11/01/20 1008     Visit Number 20    Number of Visits 36    Date for OT Re-Evaluation 11/21/20    Authorization Type 10th visit progress note completed 9/8/ 22 , Reporting Period 08/30/20- 09/27/20    OT Start Time 1003    OT Stop Time 1105    OT Time Calculation (min) 62 min    Equipment Utilized During Treatment [employer] device sample    Activity Tolerance Patient tolerated treatment well;No increased pain    Behavior During Therapy [facility] for tasks assessed/performed             Past Medical History:  Diagnosis Date   Anxiety    Chronic leg pain    Depression    Essential hypertension, benign 10/20/2015   Schizoaffective disorder [facility])     Past Surgical History:  Procedure Laterality Date   Block procedure at pain center  03/27/06   Bone graft from hip to jaw  2004   Bone graft right side of head to jaw  2006   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   Subjective Assessment - 11/01/20 1256     Subjective  [name] presents for OT  treatment visit 20/ 36 to address BLE lymphedema. He is un accompanied by today. Pt reports 0/10 leg pain this morning. Pt denies new complaints.    Patient is accompanied by: Family member    Pertinent History  H&P relevant to BLE lymphedema:   tobacco abuse, HTN, hx BLE ulcers, hx cellulitis, Hx DVT, Hx patellar fx, chronic BLE pain, chronic BLE lymphedema, neuropathy, Hx pneumonia, snoring. chronic diastolic heart failure, sinus tachycardia, chronic back pain, psychotic mood disorder/ Schitzoaffective disorder- depressive type, snoring    Limitations chronic leg pain and swelling, decreased balance,    Repetition Increases Symptoms    Special Tests Intake FOTO: 45/100    Patient Stated Goals get pain and swelling down in my legs    Pain Onset More than a month ago   approximately 4 years ago   Pain Onset More than a month ago                 LYMPHEDEMA/ONCOLOGY QUESTIONNAIRE - 11/01/20 1258       Lymphedema Assessments   Lymphedema Assessments Lower extremities      Right Lower Extremity Lymphedema   Other RLE limb volume from ankle to tibial tuberosity is DECcreased by 5.3 %.    Other Visit 19: L leg (Rx)  limb volume ankle to popliteal fossa (A-D) measures 4052.4 ml.      Left Lower Extremity Lymphedema   Other Visit 19: L leg (Rx)  limb volume ankle to popliteal fossa (A-D) measures 4323.5 ml.    Other LLE limb volume  from ankle to tibial tuberosity is INcreased by 2.1%.                     OT Treatments/Exercises (OP) - 11/01/20 1257       ADLs   ADL Education Given Yes      Manual Therapy   Manual Therapy Edema management;Compression Bandaging    Manual therapy comments skin care w castor oil to limit dryness    Manual Lymphatic Drainage (MLD) MLD to LLE as established utilizing short neck sequence, deep abdominal breathing, functional inguinal LN, and proximal to distal J strokes and fibrosis techniques  from thigh to toes. Performed simultaneous skin care throughout MLD with low ph castor oil    Compression Bandaging custom ccl 3 garment to LLE. gradient wraps to RLE as established.                    OT Education - 11/01/20 1258     Education  Details Continued skilled Pt/caregiver education  And LE ADL training throughout visit for lymphedema self care/ home program, including compression wrapping, compression garment and device wear/care, lymphatic pumping ther ex, simple self-MLD, and skin care. Discussed initial volumetric measurements. Discussed all long term goals.    Person(s) Educated Patient;Parent(s)    Methods Explanation;Demonstration;Handout    Comprehension Verbalized understanding;Returned demonstration;Need further instruction                 OT Long Term Goals - 11/01/20 1251       OT LONG TERM GOAL #1   Title Patient's intake functional score on the FOTO tool is 45 out of 100 (higher number = greater function). Given this patient's comorbidities she will experience at least an increase in function of 4 points, or higher, to increase level of independence with basic ADLs and functional ambulation and mobility.    Baseline 45/100    Time 12    Period Weeks    Status On-going   TBA next session due to time constraints   Target Date 11/22/20      OT LONG TERM GOAL #2   Title Pt will be able to apply multi-layer, short stretch compression wraps to one leg at a time using correct gradient techniques with maximum caregiver assistance in an effort  to return the affected  limb(s)  to premorbid size and shape, to limit pain and infection risk, and to improve functional mobility and ambulation  for ADLs.    Baseline dependent    Time 4    Period Days    Status Achieved   Goal met and exceeded. Pt independent with applying wraps and laundering them.     OT LONG TERM GOAL #3   Title Pt will achieve at least 10% limb volume reduction Bilaterally during Intensive Phase CDT to prevent re-accumulation of lymphatic congestion and progression of fibrosis, to limit infection risk, to improve functional ambulation and transfers, and to promote wound healing.    Baseline dependent    Time 12    Period Weeks    Status  Partially Met   Met for LLE w/ 22.14% reduction measured 09/20/20. To date RLE is treduced ~ 5% from ankle to tibial tuberosity.   Target Date 11/22/20      OT LONG TERM GOAL #4   Title With max caregiver assistance Pt will achieve and sustain a least 85% compliance with all LE self-care home program components throughout Intensive Phase CDT, including elevation when seated, daily  skin inspection and care, lymphatic pumping ther ex, 23/7 compression wraps and simple self-MLD. Max caregiver assistance is necessary to ensure optimal limb volume reduction, to limit infection risk and to limit LE progression. Pt will arrange for assistance before we begin CDT.    Baseline dependent    Time 12    Period Weeks    Status Achieved   Met and exceeded. Pt remains 100% compliant with all LE self-care home program components     OT LONG TERM GOAL #5   Title Using assistive devices (modified independence) Pt will be able to don and doff appropriate daytime compression garments to limit lymphatic re-accumulation and LE progression before transitioning to self-management phase of CDT.Using assistive devices (modified independence) Pt will be able to don and doff appropriate daytime compression garments to limit lymphatic re-accumulation and LE progression before transitioning to self-management phase of CDT.    Baseline Max A    Time 12    Period Weeks    Status Achieved   LLE custom garment and HOS device ordered. Awaiting fitting.                  Plan - 11/01/20 1244     Clinical Impression Statement Reviewed progress twards OT goals for LE care to date. BLE comparative limb volumetrics reveal that the LLE from ankle to tibial tuberosity is increased slightly today by 2.1%, since last measured on 10th Rx visit. This appears to be a  typical fluid fluctuation as custom compression garment appears to compress and contain swelling appropriately. Pt is comfortable in the garment, is 100% compliant with  daytime use, and is able to don and doff it independently. To date the RLE is decreased by 5.3% since commencing CDT. This is an excellent start towards 10% reduction goal. Please review Long Term Goals section for detailed progress report. Provided RLE MLD, skin care and gradient wraps as established after manual therapy. OT requested status update on RLE custom garment, and we're informed it shipped from [place] on 10/10 and is transit thru customs. Cont as per POC.    OT Occupational Profile and History Comprehensive Assessment- Review of records and extensive additional review of physical, cognitive, psychosocial history related to current functional performance    Occupational performance deficits (Please refer to evaluation for details): IADL's;ADL's;Rest and Sleep;Work;Leisure;Social Participation   w max CG assist for home program   Body Structure / Function / Physical Skills ADL;Edema;Skin integrity;Pain;Decreased knowledge of precautions;Decreased knowledge of use of DME;IADL    Rehab Potential Good    Clinical Decision Making Several treatment options, min-mod task modification necessary    Comorbidities Affecting Occupational Performance: Presence of comorbidities impacting occupational performance    Comorbidities impacting occupational performance description: suspected CVI, hx cellulitis, hx dvt    Modification or Assistance to Complete Evaluation  Min-Moderate modification of tasks or assist with assess necessary to complete eval    OT Frequency 2x / week    OT Duration 12 weeks   and PRN   OT Treatment/Interventions Self-care/ADL training;Therapeutic exercise;Manual Therapy;Coping strategies training;Therapeutic activities;Manual lymph drainage;DME and/or AE instruction;Compression bandaging;Other (comment);Patient/family education   skin care with low ph castor oil and/ or eucerin lotion   Plan Intensive and Self-Management Phases of Complete Decongestive Therapy (CDT) to include manual  lymphatic drainage, skin care, compression wraps and garments, therapeutic exercise, caregiver training    Recommended Other Services fit with BLE, knee length, custom compression garments and HOS devices needed to limit worsening fibrosis formation  during HOS. Consider fitting with advanced, sequential , pneumatic compression device, or Flexitouch "pump" medically necessary for optimal level of independence with LE self-management at home over time    Consulted and Agree with Plan of Care Patient;Family member/caregiver             Patient will benefit from skilled therapeutic intervention in order to improve the following deficits and impairments:   Body Structure / Function / Physical Skills: ADL, Edema, Skin integrity, Pain, Decreased knowledge of precautions, Decreased knowledge of use of DME, IADL       Visit Diagnosis: Lymphedema, not elsewhere classified    Problem List Patient Active Problem List   Diagnosis Date Noted   Ulcers of both lower legs ([place]) 09/09/2019   Lymphedema of both lower extremities 09/09/2019   Sinus tachycardia 08/06/2019   Chronic diastolic heart failure (HCC)    Benign prostatic hyperplasia with lower urinary tract symptoms 07/02/2019   Snoring 07/02/2019   Cellulitis of left leg 05/15/2019   Left leg cellulitis 05/11/2019   Swelling abdomen 05/11/2019   Thiamine deficiency 12/28/2018   Pneumonia 03/28/2018   Subacute delirium 03/23/2018   Difficulty urinating 02/03/2018   Peripheral neuropathy 05/07/2017   Folate deficiency 05/07/2017   Pleural effusion, left 04/12/2016   Peripheral edema 02/29/2016   Hoarseness of voice 11/23/2015   Essential hypertension, benign 10/20/2015   Schizoaffective disorder, depressive type ([place]) 02/17/2015   Vitamin D deficiency 02/14/2015   Low back pain 10/27/2014   Dyspnea on exertion 07/19/2014   Acute upper back pain 05/21/2013   Psychotic mood disorder ([place]) 05/14/2012   History of DVT (deep vein  thrombosis) 07/06/2010   History of patellar fracture 07/06/2010   HYPERCHOLESTEROLEMIA 10/13/2009   PREDIABETES 10/13/2009   Anxiety state 06/02/2006   TOBACCO ABUSE 06/02/2006   Chronic back pain 06/02/2006    Andrey Spearman, MS, OTR/L, CLT-LANA 11/01/20 12:59 PM    [place] [facility] MAIN [facility] SERVICES [address] [place], [place], [address] Phone: [phone]   Fax:  [phone]  Name: TYRION GLAUDE MRN: [id number] Date of Birth: [birth year]

## 2020-11-03 ENCOUNTER — Ambulatory Visit: Payer: Medicare Other | Admitting: Occupational Therapy

## 2020-11-03 ENCOUNTER — Other Ambulatory Visit: Payer: Self-pay

## 2020-11-03 DIAGNOSIS — I890 Lymphedema, not elsewhere classified: Secondary | ICD-10-CM

## 2020-11-03 NOTE — Therapy (Signed)
[place] MAIN [facility] SERVICES [address] [place], [place], [address] Phone: [phone]   Fax:  [phone]  Occupational Therapy Treatment  Patient Details  Name: Jeffrey Lin MRN: [id number] Date of Birth: Oct 04, 1975 Referring Provider (OT): Jeri Cos, [place]   Encounter Date: 11/03/2020   OT End of Session - 11/03/20 1013     Visit Number 21    Number of Visits 36    Date for OT Re-Evaluation 11/21/20    Authorization Type 10th visit progress note completed 9/8/ 22 , Reporting Period 08/30/20- 09/27/20    OT Start Time 1005    OT Stop Time 1057    OT Time Calculation (min) 52 min    Activity Tolerance Patient tolerated treatment well;No increased pain    Behavior During Therapy [facility] for tasks assessed/performed             Past Medical History:  Diagnosis Date   Anxiety    Chronic leg pain    Depression    Essential hypertension, benign 10/20/2015   Schizoaffective disorder [facility])     Past Surgical History:  Procedure Laterality Date   Block procedure at pain center  03/27/06   Bone graft from hip to jaw  2004   Bone graft right side of head to jaw  2006   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   Subjective Assessment - 11/03/20 1016     Subjective  Jeffrey Lin presents for OT  treatment visit 21/ 36 to address BLE lymphedema. He is un accompanied by today. Pt reports 0/10 leg pain this morning. Pt denies new complaints.    Patient is accompanied by: Family member    Pertinent History H&P relevant to BLE lymphedema:   tobacco abuse, HTN, hx BLE ulcers, hx cellulitis, Hx DVT, Hx patellar fx, chronic BLE pain, chronic  BLE lymphedema, neuropathy, Hx pneumonia, snoring. chronic diastolic heart failure, sinus tachycardia, chronic back pain, psychotic mood disorder/ Schitzoaffective disorder- depressive type, snoring    Limitations chronic leg pain and swelling, decreased balance,    Repetition Increases Symptoms    Special Tests Intake FOTO: 45/100    Patient Stated Goals get pain and swelling down in my legs    Pain Onset More than a month ago   approximately 4 years ago   Pain Onset More than a month ago                          OT Treatments/Exercises (OP) - 11/03/20 1016       ADLs   ADL Education Given Yes      Manual Therapy   Manual Therapy Edema management;Compression Bandaging    Manual therapy comments skin care w castor oil to limit dryness    Manual Lymphatic Drainage (MLD) MLD to RLE as established utilizing short neck sequence, deep abdominal breathing, functional inguinal LN, and proximal to distal J strokes and fibrosis techniques  from thigh to toes. Performed simultaneous skin care throughout MLD with low ph castor oil    Compression Bandaging custom ccl 3 garment to LLE. gradient wraps to RLE as established.  OT Education - 11/03/20 1017     Education Details Continued skilled Pt/caregiver education  And LE ADL training throughout visit for lymphedema self care/ home program, including compression wrapping, compression garment and device wear/care, lymphatic pumping ther ex, simple self-MLD, and skin care. Discussed initial volumetric measurements. Discussed all long term goals.    Person(s) Educated Patient;Parent(s)    Methods Explanation;Demonstration;Handout    Comprehension Verbalized understanding;Returned demonstration;Need further instruction                 OT Long Term Goals - 11/01/20 1251       OT LONG TERM GOAL #1   Title Patient's intake functional score on the FOTO tool is 45 out of 100 (higher number = greater  function). Given this patient's comorbidities she will experience at least an increase in function of 4 points, or higher, to increase level of independence with basic ADLs and functional ambulation and mobility.    Baseline 45/100    Time 12    Period Weeks    Status On-going   TBA next session due to time constraints   Target Date 11/22/20      OT LONG TERM GOAL #2   Title Pt will be able to apply multi-layer, short stretch compression wraps to one leg at a time using correct gradient techniques with maximum caregiver assistance in an effort  to return the affected  limb(s)  to premorbid size and shape, to limit pain and infection risk, and to improve functional mobility and ambulation  for ADLs.    Baseline dependent    Time 4    Period Days    Status Achieved   Goal met and exceeded. Pt independent with applying wraps and laundering them.     OT LONG TERM GOAL #3   Title Pt will achieve at least 10% limb volume reduction Bilaterally during Intensive Phase CDT to prevent re-accumulation of lymphatic congestion and progression of fibrosis, to limit infection risk, to improve functional ambulation and transfers, and to promote wound healing.    Baseline dependent    Time 12    Period Weeks    Status Partially Met   Met for LLE w/ 22.14% reduction measured 09/20/20. To date RLE is treduced ~ 5% from ankle to tibial tuberosity.   Target Date 11/22/20      OT LONG TERM GOAL #4   Title With max caregiver assistance Pt will achieve and sustain a least 85% compliance with all LE self-care home program components throughout Intensive Phase CDT, including elevation when seated, daily skin inspection and care, lymphatic pumping ther ex, 23/7 compression wraps and simple self-MLD. Max caregiver assistance is necessary to ensure optimal limb volume reduction, to limit infection risk and to limit LE progression. Pt will arrange for assistance before we begin CDT.    Baseline dependent    Time 12     Period Weeks    Status Achieved   Met and exceeded. Pt remains 100% compliant with all LE self-care home program components     OT LONG TERM GOAL #5   Title Using assistive devices (modified independence) Pt will be able to don and doff appropriate daytime compression garments to limit lymphatic re-accumulation and LE progression before transitioning to self-management phase of CDT.Using assistive devices (modified independence) Pt will be able to don and doff appropriate daytime compression garments to limit lymphatic re-accumulation and LE progression before transitioning to self-management phase of CDT.    Baseline Max A  Time 12    Period Weeks    Status Achieved   LLE custom garment and HOS device ordered. Awaiting fitting.                  Plan - 11/03/20 1014     Clinical Impression Statement Provided LLE MLD, skin care and compression therapy  without increased pain. Applied gradient compression wraps to RLE . Pt wearing LLE stocking through session. Marland Kitchen RLE compression garment has been shipped per DME vendor.  Cont as per POC.    OT Occupational Profile and History Comprehensive Assessment- Review of records and extensive additional review of physical, cognitive, psychosocial history related to current functional performance    Occupational performance deficits (Please refer to evaluation for details): IADL's;ADL's;Rest and Sleep;Work;Leisure;Social Participation   w max CG assist for home program   Body Structure / Function / Physical Skills ADL;Edema;Skin integrity;Pain;Decreased knowledge of precautions;Decreased knowledge of use of DME;IADL    Rehab Potential Good    Clinical Decision Making Several treatment options, min-mod task modification necessary    Comorbidities Affecting Occupational Performance: Presence of comorbidities impacting occupational performance    Comorbidities impacting occupational performance description: suspected CVI, hx cellulitis, hx dvt     Modification or Assistance to Complete Evaluation  Min-Moderate modification of tasks or assist with assess necessary to complete eval    OT Frequency 2x / week    OT Duration 12 weeks   and PRN   OT Treatment/Interventions Self-care/ADL training;Therapeutic exercise;Manual Therapy;Coping strategies training;Therapeutic activities;Manual lymph drainage;DME and/or AE instruction;Compression bandaging;Other (comment);Patient/family education   skin care with low ph castor oil and/ or eucerin lotion   Plan Intensive and Self-Management Phases of Complete Decongestive Therapy (CDT) to include manual lymphatic drainage, skin care, compression wraps and garments, therapeutic exercise, caregiver training    Recommended Other Services fit with BLE, knee length, custom compression garments and HOS devices needed to limit worsening fibrosis formation during HOS. Consider fitting with advanced, sequential , pneumatic compression device, or Flexitouch "pump" medically necessary for optimal level of independence with LE self-management at home over time    Consulted and Agree with Plan of Care Patient;Family member/caregiver             Patient will benefit from skilled therapeutic intervention in order to improve the following deficits and impairments:   Body Structure / Function / Physical Skills: ADL, Edema, Skin integrity, Pain, Decreased knowledge of precautions, Decreased knowledge of use of DME, IADL       Visit Diagnosis: Lymphedema, not elsewhere classified    Problem List Patient Active Problem List   Diagnosis Date Noted   Ulcers of both lower legs ([place]) 09/09/2019   Lymphedema of both lower extremities 09/09/2019   Sinus tachycardia 08/06/2019   Chronic diastolic heart failure (HCC)    Benign prostatic hyperplasia with lower urinary tract symptoms 07/02/2019   Snoring 07/02/2019   Cellulitis of left leg 05/15/2019   Left leg cellulitis 05/11/2019   Swelling abdomen 05/11/2019    Thiamine deficiency 12/28/2018   Pneumonia 03/28/2018   Subacute delirium 03/23/2018   Difficulty urinating 02/03/2018   Peripheral neuropathy 05/07/2017   Folate deficiency 05/07/2017   Pleural effusion, left 04/12/2016   Peripheral edema 02/29/2016   Hoarseness of voice 11/23/2015   Essential hypertension, benign 10/20/2015   Schizoaffective disorder, depressive type ([place]) 02/17/2015   Vitamin D deficiency 02/14/2015   Low back pain 10/27/2014   Dyspnea on exertion 07/19/2014   Acute upper back pain 05/21/2013  Psychotic mood disorder ([place]) 05/14/2012   History of DVT (deep vein thrombosis) 07/06/2010   History of patellar fracture 07/06/2010   HYPERCHOLESTEROLEMIA 10/13/2009   PREDIABETES 10/13/2009   Anxiety state 06/02/2006   TOBACCO ABUSE 06/02/2006   Chronic back pain 06/02/2006    Andrey Spearman, MS, OTR/L, [facility] 11/03/20 10:59 AM    [place] MAIN [facility] SERVICES [address] [place], [place], [address] Phone: [phone]   Fax:  [phone]  Name: Jeffrey Lin MRN: [id number] Date of Birth: March 14, 1975

## 2020-11-03 NOTE — Patient Instructions (Signed)

## 2020-11-08 ENCOUNTER — Other Ambulatory Visit: Payer: Self-pay

## 2020-11-08 ENCOUNTER — Ambulatory Visit: Payer: Medicare Other | Admitting: Occupational Therapy

## 2020-11-08 DIAGNOSIS — I890 Lymphedema, not elsewhere classified: Secondary | ICD-10-CM | POA: Diagnosis not present

## 2020-11-08 NOTE — Therapy (Signed)
[place] MAIN [facility] SERVICES [address] [place], [place], [address] Phone: [phone]   Fax:  [phone]  Occupational Therapy Treatment  Patient Details  Name: Jeffrey Lin MRN: [id number] Date of Birth: Oct 15, 1975 Referring Provider (OT): Jeri Cos, [place]   Encounter Date: 11/08/2020   OT End of Session - 11/08/20 1522     Visit Number 22    Number of Visits 36    Date for OT Re-Evaluation 11/21/20    Authorization Type 10th visit progress note completed 9/8/ 22 , Reporting Period 08/30/20- 09/27/20    Activity Tolerance Patient tolerated treatment well;No increased pain    Behavior During Therapy [facility] for tasks assessed/performed             Past Medical History:  Diagnosis Date   Anxiety    Chronic leg pain    Depression    Essential hypertension, benign 10/20/2015   Schizoaffective disorder [facility])     Past Surgical History:  Procedure Laterality Date   Block procedure at pain center  03/27/06   Bone graft from hip to jaw  2004   Bone graft right side of head to jaw  2006   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   Subjective Assessment - 11/08/20 1523     Subjective  Jeffrey Lin presents for OT  treatment visit 212 31 to address BLE lymphedema. He is un accompanied by today. Pt reports 0/10 leg pain this morning. Pt denies new complaints.    Patient is accompanied by: Family member    Pertinent History H&P relevant to BLE lymphedema:   tobacco abuse, HTN, hx BLE ulcers, hx cellulitis, Hx DVT, Hx patellar fx, chronic BLE pain, chronic BLE lymphedema, neuropathy, Hx pneumonia, snoring. chronic diastolic heart failure,  sinus tachycardia, chronic back pain, psychotic mood disorder/ Schitzoaffective disorder- depressive type, snoring    Limitations chronic leg pain and swelling, decreased balance,    Repetition Increases Symptoms    Special Tests Intake FOTO: 45/100    Patient Stated Goals get pain and swelling down in my legs    Pain Onset More than a month ago   approximately 4 years ago   Pain Onset More than a month ago                                  OT Education - 11/08/20 1527     Education Details Continued skilled Pt/caregiver education  And LE ADL training throughout visit for lymphedema self care/ home program, including compression wrapping, compression garment and device wear/care, lymphatic pumping ther ex, simple self-MLD, and skin care. Discussed initial volumetric measurements. Discussed all long term goals.    Person(s) Educated Patient;Parent(s)    Methods Explanation;Demonstration;Handout    Comprehension Verbalized understanding;Returned demonstration;Need further instruction                 OT Long Term Goals - 11/01/20 1251       OT LONG TERM GOAL #1   Title Patient's intake functional score on the FOTO tool is 45 out of 100 (higher number =  greater function). Given this patient's comorbidities she will experience at least an increase in function of 4 points, or higher, to increase level of independence with basic ADLs and functional ambulation and mobility.    Baseline 45/100    Time 12    Period Weeks    Status On-going   TBA next session due to time constraints   Target Date 11/22/20      OT LONG TERM GOAL #2   Title Pt will be able to apply multi-layer, short stretch compression wraps to one leg at a time using correct gradient techniques with maximum caregiver assistance in an effort  to return the affected  limb(s)  to premorbid size and shape, to limit pain and infection risk, and to improve functional mobility and ambulation  for ADLs.     Baseline dependent    Time 4    Period Days    Status Achieved   Goal met and exceeded. Pt independent with applying wraps and laundering them.     OT LONG TERM GOAL #3   Title Pt will achieve at least 10% limb volume reduction Bilaterally during Intensive Phase CDT to prevent re-accumulation of lymphatic congestion and progression of fibrosis, to limit infection risk, to improve functional ambulation and transfers, and to promote wound healing.    Baseline dependent    Time 12    Period Weeks    Status Partially Met   Met for LLE w/ 22.14% reduction measured 09/20/20. To date RLE is treduced ~ 5% from ankle to tibial tuberosity.   Target Date 11/22/20      OT LONG TERM GOAL #4   Title With max caregiver assistance Pt will achieve and sustain a least 85% compliance with all LE self-care home program components throughout Intensive Phase CDT, including elevation when seated, daily skin inspection and care, lymphatic pumping ther ex, 23/7 compression wraps and simple self-MLD. Max caregiver assistance is necessary to ensure optimal limb volume reduction, to limit infection risk and to limit LE progression. Pt will arrange for assistance before we begin CDT.    Baseline dependent    Time 12    Period Weeks    Status Achieved   Met and exceeded. Pt remains 100% compliant with all LE self-care home program components     OT LONG TERM GOAL #5   Title Using assistive devices (modified independence) Pt will be able to don and doff appropriate daytime compression garments to limit lymphatic re-accumulation and LE progression before transitioning to self-management phase of CDT.Using assistive devices (modified independence) Pt will be able to don and doff appropriate daytime compression garments to limit lymphatic re-accumulation and LE progression before transitioning to self-management phase of CDT.    Baseline Max A    Time 12    Period Weeks    Status Achieved   LLE custom garment and HOS device  ordered. Awaiting fitting.                  Plan - 11/08/20 1523     Clinical Impression Statement Completed initial fitting for RLE custom compression garment this morning. Fit appears excellent and Pt reports comfort in garments. Pt able to answer several questions re wear and care regime without cues. Pt able to don and doff both custom garments  independently. Remainder of session devoted to LLE MLD and skin care. We'll finalized garment assessment next session then reduce OT frequency to follow along status. Pt n agreement with plan.  OT Occupational Profile and History Comprehensive Assessment- Review of records and extensive additional review of physical, cognitive, psychosocial history related to current functional performance    Occupational performance deficits (Please refer to evaluation for details): IADL's;ADL's;Rest and Sleep;Work;Leisure;Social Participation   w max CG assist for home program   Body Structure / Function / Physical Skills ADL;Edema;Skin integrity;Pain;Decreased knowledge of precautions;Decreased knowledge of use of DME;IADL    Rehab Potential Good    Clinical Decision Making Several treatment options, min-mod task modification necessary    Comorbidities Affecting Occupational Performance: Presence of comorbidities impacting occupational performance    Comorbidities impacting occupational performance description: suspected CVI, hx cellulitis, hx dvt    Modification or Assistance to Complete Evaluation  Min-Moderate modification of tasks or assist with assess necessary to complete eval    OT Frequency 2x / week    OT Duration 12 weeks   and PRN   OT Treatment/Interventions Self-care/ADL training;Therapeutic exercise;Manual Therapy;Coping strategies training;Therapeutic activities;Manual lymph drainage;DME and/or AE instruction;Compression bandaging;Other (comment);Patient/family education   skin care with low ph castor oil and/ or eucerin lotion   Plan  Intensive and Self-Management Phases of Complete Decongestive Therapy (CDT) to include manual lymphatic drainage, skin care, compression wraps and garments, therapeutic exercise, caregiver training    Recommended Other Services fit with BLE, knee length, custom compression garments and HOS devices needed to limit worsening fibrosis formation during HOS. Consider fitting with advanced, sequential , pneumatic compression device, or Flexitouch "pump" medically necessary for optimal level of independence with LE self-management at home over time    Consulted and Agree with Plan of Care Patient;Family member/caregiver             Patient will benefit from skilled therapeutic intervention in order to improve the following deficits and impairments:   Body Structure / Function / Physical Skills: ADL, Edema, Skin integrity, Pain, Decreased knowledge of precautions, Decreased knowledge of use of DME, IADL       Visit Diagnosis: Lymphedema, not elsewhere classified    Problem List Patient Active Problem List   Diagnosis Date Noted   Ulcers of both lower legs ([place]) 09/09/2019   Lymphedema of both lower extremities 09/09/2019   Sinus tachycardia 08/06/2019   Chronic diastolic heart failure (HCC)    Benign prostatic hyperplasia with lower urinary tract symptoms 07/02/2019   Snoring 07/02/2019   Cellulitis of left leg 05/15/2019   Left leg cellulitis 05/11/2019   Swelling abdomen 05/11/2019   Thiamine deficiency 12/28/2018   Pneumonia 03/28/2018   Subacute delirium 03/23/2018   Difficulty urinating 02/03/2018   Peripheral neuropathy 05/07/2017   Folate deficiency 05/07/2017   Pleural effusion, left 04/12/2016   Peripheral edema 02/29/2016   Hoarseness of voice 11/23/2015   Essential hypertension, benign 10/20/2015   Schizoaffective disorder, depressive type ([place]) 02/17/2015   Vitamin D deficiency 02/14/2015   Low back pain 10/27/2014   Dyspnea on exertion 07/19/2014   Acute upper back  pain 05/21/2013   Psychotic mood disorder ([place]) 05/14/2012   History of DVT (deep vein thrombosis) 07/06/2010   History of patellar fracture 07/06/2010   HYPERCHOLESTEROLEMIA 10/13/2009   PREDIABETES 10/13/2009   Anxiety state 06/02/2006   TOBACCO ABUSE 06/02/2006   Chronic back pain 06/02/2006   Andrey Spearman, MS, OTR/L, [facility] 11/08/20 3:28 PM   [place] MAIN [facility] SERVICES [address] [place], [place], [address] Phone: [phone]   Fax:  [phone]  Name: MARLEE TRENTMAN MRN: [id number] Date of Birth: 24-Mar-1975

## 2020-11-08 NOTE — Patient Instructions (Signed)

## 2020-11-10 ENCOUNTER — Ambulatory Visit: Payer: Medicare Other | Admitting: Occupational Therapy

## 2020-11-10 ENCOUNTER — Other Ambulatory Visit: Payer: Self-pay

## 2020-11-10 DIAGNOSIS — I890 Lymphedema, not elsewhere classified: Secondary | ICD-10-CM | POA: Diagnosis not present

## 2020-11-10 NOTE — Therapy (Signed)
[place] MAIN [facility] SERVICES [address] [place], [place], [address] Phone: [phone]   Fax:  [phone]  Occupational Therapy Treatment  Patient Details  Name: Jeffrey Lin MRN: [id number] Date of Birth: [birth year] Referring Provider (OT): Jeri Cos, [place]   Encounter Date: 11/10/2020   OT End of Session - 11/10/20 1010     Visit Number 23    Number of Visits 36    Date for OT Re-Evaluation 11/21/20    Authorization Type 10th visit progress note completed 9/8/ 22 , Reporting Period 08/30/20- 09/27/20    OT Start Time 1010    OT Stop Time 1035    OT Time Calculation (min) 25 min    Activity Tolerance Patient tolerated treatment well;No increased pain    Behavior During Therapy [facility] for tasks assessed/performed             Past Medical History:  Diagnosis Date   Anxiety    Chronic leg pain    Depression    Essential hypertension, benign 10/20/2015   Schizoaffective disorder [facility])     Past Surgical History:  Procedure Laterality Date   Block procedure at pain center  03/27/06   Bone graft from hip to jaw  2004   Bone graft right side of head to jaw  2006   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   Subjective Assessment - 11/10/20 1101     Subjective  Jeffrey Lin presents for OT  treatment visit 23/ 36 to address BLE lymphedema. He presents wearing new [employer] tennis shoes and jeans, instead of pajama pants.. Pt reports 0/10 leg pain this morning. Pt denies new complaints.    Patient is accompanied by: Family member    Pertinent History H&P relevant to BLE lymphedema:   tobacco abuse, HTN, hx BLE ulcers, hx  cellulitis, Hx DVT, Hx patellar fx, chronic BLE pain, chronic BLE lymphedema, neuropathy, Hx pneumonia, snoring. chronic diastolic heart failure, sinus tachycardia, chronic back pain, psychotic mood disorder/ Schitzoaffective disorder- depressive type, snoring    Limitations chronic leg pain and swelling, decreased balance,    Repetition Increases Symptoms    Special Tests Intake FOTO: 45/100    Patient Stated Goals get pain and swelling down in my legs    Pain Onset More than a month ago   approximately 4 years ago   Pain Onset More than a month ago                          OT Treatments/Exercises (OP) - 11/10/20 1102       ADLs   ADL Education Given Yes      Manual Therapy   Manual Therapy Edema management;Compression Bandaging    Edema Management final fitting for custom RLE garment.                    OT Education - 11/10/20 1102     Education Details Continued skilled Pt/caregiver education  And LE ADL training throughout visit for lymphedema self care/ home program, including compression wrapping, compression garment and device wear/care, lymphatic pumping ther ex, simple self-MLD,  and skin care. Discussed initial volumetric measurements. Discussed all long term goals.    Person(s) Educated Patient;Parent(s)    Methods Explanation;Demonstration;Handout    Comprehension Verbalized understanding;Returned demonstration                 OT Long Term Goals - 11/10/20 1059       OT LONG TERM GOAL #1   Title Patient's intake functional score on the FOTO tool is 45 out of 100 (higher number = greater function). Given this patient's comorbidities she will experience at least an increase in function of 4 points, or higher, to increase level of independence with basic ADLs and functional ambulation and mobility.    Baseline 45/100    Time 12    Period Weeks    Status Achieved   final score up 29 points from 45/100 to 74/100. Goal met     OT LONG  TERM GOAL #2   Title Pt will be able to apply multi-layer, short stretch compression wraps to one leg at a time using correct gradient techniques with maximum caregiver assistance in an effort  to return the affected  limb(s)  to premorbid size and shape, to limit pain and infection risk, and to improve functional mobility and ambulation  for ADLs.    Baseline dependent    Time 4    Period Days    Status Achieved   Goal met and exceeded. Pt independent with applying wraps and laundering them.     OT LONG TERM GOAL #3   Title Pt will achieve at least 10% limb volume reduction Bilaterally during Intensive Phase CDT to prevent re-accumulation of lymphatic congestion and progression of fibrosis, to limit infection risk, to improve functional ambulation and transfers, and to promote wound healing.    Baseline dependent    Time 12    Period Weeks    Status Partially Met   Met for LLE w/ 22.14% reduction measured 09/20/20. To date RLE is treduced ~ 5% from ankle to tibial tuberosity.   Target Date 11/22/20      OT LONG TERM GOAL #4   Title With max caregiver assistance Pt will achieve and sustain a least 85% compliance with all LE self-care home program components throughout Intensive Phase CDT, including elevation when seated, daily skin inspection and care, lymphatic pumping ther ex, 23/7 compression wraps and simple self-MLD. Max caregiver assistance is necessary to ensure optimal limb volume reduction, to limit infection risk and to limit LE progression. Pt will arrange for assistance before we begin CDT.    Baseline dependent    Time 12    Period Weeks    Status Achieved   Met and exceeded. Pt remains 100% compliant with all LE self-care home program components     OT LONG TERM GOAL #5   Title Using assistive devices (modified independence) Pt will be able to don and doff appropriate daytime compression garments to limit lymphatic re-accumulation and LE progression before transitioning to  self-management phase of CDT.Using assistive devices (modified independence) Pt will be able to don and doff appropriate daytime compression garments to limit lymphatic re-accumulation and LE progression before transitioning to self-management phase of CDT.    Baseline Max A    Time 12    Period Weeks    Status Achieved   LLE custom garment and HOS device ordered. Awaiting fitting.                  Plan - 11/10/20 1055  Clinical Impression Statement Completed final fitting for RLE custom compression stocking. Both garments fit and function well, containing swelling and limiting reaccumulation of fluid in the legs while being comfortable and easy to don/ doff. Pt completed  final FOTO survey this morning. Today's score reveals a 29 point increase, from 45/100 initially at intake to 74/100 today. Goal met and exceeded. Pt reports pain has resolved in leags. He is able to walk more, to do more, "and I can even squat now". Pt is able to fit preferred street shoes this morning and wears jeans instead of pajama pants to clinic. Jeffrey Lin agrees to return in 3 weeks for follow up. Con as per POC.    OT Occupational Profile and History Comprehensive Assessment- Review of records and extensive additional review of physical, cognitive, psychosocial history related to current functional performance    Occupational performance deficits (Please refer to evaluation for details): IADL's;ADL's;Rest and Sleep;Work;Leisure;Social Participation   w max CG assist for home program   Body Structure / Function / Physical Skills ADL;Edema;Skin integrity;Pain;Decreased knowledge of precautions;Decreased knowledge of use of DME;IADL    Rehab Potential Good    Clinical Decision Making Several treatment options, min-mod task modification necessary    Comorbidities Affecting Occupational Performance: Presence of comorbidities impacting occupational performance    Comorbidities impacting occupational performance  description: suspected CVI, hx cellulitis, hx dvt    Modification or Assistance to Complete Evaluation  Min-Moderate modification of tasks or assist with assess necessary to complete eval    OT Frequency 2x / week    OT Duration 12 weeks   and PRN   OT Treatment/Interventions Self-care/ADL training;Therapeutic exercise;Manual Therapy;Coping strategies training;Therapeutic activities;Manual lymph drainage;DME and/or AE instruction;Compression bandaging;Other (comment);Patient/family education   skin care with low ph castor oil and/ or eucerin lotion   Plan Intensive and Self-Management Phases of Complete Decongestive Therapy (CDT) to include manual lymphatic drainage, skin care, compression wraps and garments, therapeutic exercise, caregiver training    Recommended Other Services fit with BLE, knee length, custom compression garments and HOS devices needed to limit worsening fibrosis formation during HOS. Consider fitting with advanced, sequential , pneumatic compression device, or Flexitouch "pump" medically necessary for optimal level of independence with LE self-management at home over time    Consulted and Agree with Plan of Care Patient;Family member/caregiver             Patient will benefit from skilled therapeutic intervention in order to improve the following deficits and impairments:   Body Structure / Function / Physical Skills: ADL, Edema, Skin integrity, Pain, Decreased knowledge of precautions, Decreased knowledge of use of DME, IADL       Visit Diagnosis: Lymphedema, not elsewhere classified    Problem List Patient Active Problem List   Diagnosis Date Noted   Ulcers of both lower legs ([place]) 09/09/2019   Lymphedema of both lower extremities 09/09/2019   Sinus tachycardia 08/06/2019   Chronic diastolic heart failure (HCC)    Benign prostatic hyperplasia with lower urinary tract symptoms 07/02/2019   Snoring 07/02/2019   Cellulitis of left leg 05/15/2019   Left leg  cellulitis 05/11/2019   Swelling abdomen 05/11/2019   Thiamine deficiency 12/28/2018   Pneumonia 03/28/2018   Subacute delirium 03/23/2018   Difficulty urinating 02/03/2018   Peripheral neuropathy 05/07/2017   Folate deficiency 05/07/2017   Pleural effusion, left 04/12/2016   Peripheral edema 02/29/2016   Hoarseness of voice 11/23/2015   Essential hypertension, benign 10/20/2015   Schizoaffective disorder, depressive type ([place])  02/17/2015   Vitamin D deficiency 02/14/2015   Low back pain 10/27/2014   Dyspnea on exertion 07/19/2014   Acute upper back pain 05/21/2013   Psychotic mood disorder ([place]) 05/14/2012   History of DVT (deep vein thrombosis) 07/06/2010   History of patellar fracture 07/06/2010   HYPERCHOLESTEROLEMIA 10/13/2009   PREDIABETES 10/13/2009   Anxiety state 06/02/2006   TOBACCO ABUSE 06/02/2006   Chronic back pain 06/02/2006   Andrey Spearman, MS, OTR/L, [facility] 11/10/20 11:03 AM   [place] MAIN [facility] SERVICES [address] [place], [place], [address] Phone: [phone]   Fax:  [phone]  Name: Jeffrey Lin MRN: [id number] Date of Birth: [birth year]

## 2020-11-15 ENCOUNTER — Encounter: Payer: Medicare Other | Admitting: Occupational Therapy

## 2020-11-17 ENCOUNTER — Encounter: Payer: Medicare Other | Admitting: Occupational Therapy

## 2020-11-22 ENCOUNTER — Encounter: Payer: Medicare Other | Admitting: Occupational Therapy

## 2020-11-23 DIAGNOSIS — H5213 Myopia, bilateral: Secondary | ICD-10-CM | POA: Diagnosis not present

## 2020-11-24 ENCOUNTER — Encounter: Payer: Medicare Other | Admitting: Occupational Therapy

## 2020-11-29 ENCOUNTER — Encounter: Payer: Medicare Other | Admitting: Occupational Therapy

## 2020-12-06 ENCOUNTER — Encounter: Payer: Medicare Other | Admitting: Occupational Therapy

## 2020-12-08 ENCOUNTER — Ambulatory Visit: Payer: Medicare Other | Attending: Physician Assistant | Admitting: Occupational Therapy

## 2020-12-08 ENCOUNTER — Other Ambulatory Visit: Payer: Self-pay

## 2020-12-08 DIAGNOSIS — I890 Lymphedema, not elsewhere classified: Secondary | ICD-10-CM | POA: Insufficient documentation

## 2020-12-08 NOTE — Therapy (Signed)
[facility] SERVICES [address] [place], [place], [address] Phone: [phone]   Fax:  [phone]  Occupational Therapy Treatment  Patient Details  Name: Jeffrey Lin MRN: [id number] Date of Birth: May 03, 1975 Referring Provider (OT): Jeri Cos, [place]   Encounter Date: 12/08/2020   OT End of Session - 12/08/20 0958     Visit Number 24    Number of Visits 36    Date for OT Re-Evaluation 03/08/21    Authorization Type 10th visit progress note completed 9/8/ 22 , Reporting Period 08/30/20- 09/27/20    OT Start Time 1000    Activity Tolerance Patient tolerated treatment well;No increased pain    Behavior During Therapy [facility] for tasks assessed/performed             Past Medical History:  Diagnosis Date   Anxiety    Chronic leg pain    Depression    Essential hypertension, benign 10/20/2015   Schizoaffective disorder [facility])     Past Surgical History:  Procedure Laterality Date   Block procedure at pain center  03/27/06   Bone graft from hip to jaw  2004   Bone graft right side of head to jaw  2006   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   Subjective Assessment - 12/08/20 1016     Subjective  Jeffrey Lin presents for OT  treatment visit 24/ 36 to address BLE lymphedema. He presents wearing custom compression knee highs. He reports 1-2/10 pain in legs below the knees. Pt is here for f/u. He was last seen on 10/21 for final garment fitting.    Patient is accompanied by: Family member    Pertinent History H&P relevant to BLE lymphedema:   tobacco abuse, HTN, hx BLE ulcers, hx cellulitis, Hx DVT, Hx patellar fx, chronic  BLE pain, chronic BLE lymphedema, neuropathy, Hx pneumonia, snoring. chronic diastolic heart failure, sinus tachycardia, chronic back pain, psychotic mood disorder/ Schitzoaffective disorder- depressive type, snoring    Limitations chronic leg pain and swelling, decreased balance,    Repetition Increases Symptoms    Special Tests Intake FOTO: 45/100    Patient Stated Goals get pain and swelling down in my legs    Pain Onset More than a month ago   approximately 4 years ago   Pain Onset More than a month ago                                  OT Education - 12/08/20 1111     Education Details Continued skilled Pt/caregiver education  And LE ADL training throughout visit for lymphedema self care/ home program, including compression wrapping, compression garment and device wear/care, lymphatic pumping ther ex, simple self-MLD, and skin care. Discussed initial volumetric measurements. Discussed all long term goals.    Person(s) Educated Patient;Parent(s)    Methods Explanation;Demonstration;Handout    Comprehension Verbalized understanding;Returned demonstration                 OT Long Term Goals - 11/10/20 1059       OT LONG TERM GOAL #1  Title Patient's intake functional score on the FOTO tool is 45 out of 100 (higher number = greater function). Given this patient's comorbidities she will experience at least an increase in function of 4 points, or higher, to increase level of independence with basic ADLs and functional ambulation and mobility.    Baseline 45/100    Time 12    Period Weeks    Status Achieved   final score up 29 points from 45/100 to 74/100. Goal met     OT LONG TERM GOAL #2   Title Pt will be able to apply multi-layer, short stretch compression wraps to one leg at a time using correct gradient techniques with maximum caregiver assistance in an effort  to return the affected  limb(s)  to premorbid size and shape, to limit pain and infection  risk, and to improve functional mobility and ambulation  for ADLs.    Baseline dependent    Time 4    Period Days    Status Achieved   Goal met and exceeded. Pt independent with applying wraps and laundering them.     OT LONG TERM GOAL #3   Title Pt will achieve at least 10% limb volume reduction Bilaterally during Intensive Phase CDT to prevent re-accumulation of lymphatic congestion and progression of fibrosis, to limit infection risk, to improve functional ambulation and transfers, and to promote wound healing.    Baseline dependent    Time 12    Period Weeks    Status Partially Met   Met for LLE w/ 22.14% reduction measured 09/20/20. To date RLE is treduced ~ 5% from ankle to tibial tuberosity.   Target Date 11/22/20      OT LONG TERM GOAL #4   Title With max caregiver assistance Pt will achieve and sustain a least 85% compliance with all LE self-care home program components throughout Intensive Phase CDT, including elevation when seated, daily skin inspection and care, lymphatic pumping ther ex, 23/7 compression wraps and simple self-MLD. Max caregiver assistance is necessary to ensure optimal limb volume reduction, to limit infection risk and to limit LE progression. Pt will arrange for assistance before we begin CDT.    Baseline dependent    Time 12    Period Weeks    Status Achieved   Met and exceeded. Pt remains 100% compliant with all LE self-care home program components     OT LONG TERM GOAL #5   Title Using assistive devices (modified independence) Pt will be able to don and doff appropriate daytime compression garments to limit lymphatic re-accumulation and LE progression before transitioning to self-management phase of CDT.Using assistive devices (modified independence) Pt will be able to don and doff appropriate daytime compression garments to limit lymphatic re-accumulation and LE progression before transitioning to self-management phase of CDT.    Baseline Max A    Time 12     Period Weeks    Status Achieved   LLE custom garment and HOS device ordered. Awaiting fitting.                  Plan - 12/08/20 1101     Clinical Impression Statement Jeffrey Lin returns for 30 day follow up today. Custom garments are in good conmdition. They are showng signs of normal wear. He would like to get a second pair in January. I encouraged him to return for new measurements b/c his leg volumes appear to be slightly reduced since last treated. Legs are doeing very well. Swelling is managed  well and pain is at a minimum. Skin is somewhat dry and flakey today. Encouraged Pt to continue with daily skin care. Pt will try replacing Eucerin lotion with thicker Eucerin cream. Pt remains 100% compliant with all daily LE self care home program componenets. He'll call PRN, otherwise we'll see him in Jan, 2023 for new garment measurements. Cont F/u PRN for chronic, progressive , BLE lymphedema.    OT Occupational Profile and History Comprehensive Assessment- Review of records and extensive additional review of physical, cognitive, psychosocial history related to current functional performance    Occupational performance deficits (Please refer to evaluation for details): IADL's;ADL's;Rest and Sleep;Work;Leisure;Social Participation   w max CG assist for home program   Body Structure / Function / Physical Skills ADL;Edema;Skin integrity;Pain;Decreased knowledge of precautions;Decreased knowledge of use of DME;IADL    Rehab Potential Good    Clinical Decision Making Several treatment options, min-mod task modification necessary    Comorbidities Affecting Occupational Performance: Presence of comorbidities impacting occupational performance    Comorbidities impacting occupational performance description: suspected CVI, hx cellulitis, hx dvt    Modification or Assistance to Complete Evaluation  Min-Moderate modification of tasks or assist with assess necessary to complete eval    OT Frequency 2x / week     OT Duration 12 weeks   and PRN   OT Treatment/Interventions Self-care/ADL training;Therapeutic exercise;Manual Therapy;Coping strategies training;Therapeutic activities;Manual lymph drainage;DME and/or AE instruction;Compression bandaging;Other (comment);Patient/family education   skin care with low ph castor oil and/ or eucerin lotion   Plan Intensive and Self-Management Phases of Complete Decongestive Therapy (CDT) to include manual lymphatic drainage, skin care, compression wraps and garments, therapeutic exercise, caregiver training    Recommended Other Services fit with BLE, knee length, custom compression garments and HOS devices needed to limit worsening fibrosis formation during HOS. Consider fitting with advanced, sequential , pneumatic compression device, or Flexitouch "pump" medically necessary for optimal level of independence with LE self-management at home over time    Consulted and Agree with Plan of Care Patient;Family member/caregiver             Patient will benefit from skilled therapeutic intervention in order to improve the following deficits and impairments:   Body Structure / Function / Physical Skills: ADL, Edema, Skin integrity, Pain, Decreased knowledge of precautions, Decreased knowledge of use of DME, IADL       Visit Diagnosis: Lymphedema, not elsewhere classified - Plan: Ot plan of care cert/re-cert    Problem List Patient Active Problem List   Diagnosis Date Noted   Ulcers of both lower legs ([place]) 09/09/2019   Lymphedema of both lower extremities 09/09/2019   Sinus tachycardia 08/06/2019   Chronic diastolic heart failure (HCC)    Benign prostatic hyperplasia with lower urinary tract symptoms 07/02/2019   Snoring 07/02/2019   Cellulitis of left leg 05/15/2019   Left leg cellulitis 05/11/2019   Swelling abdomen 05/11/2019   Thiamine deficiency 12/28/2018   Pneumonia 03/28/2018   Subacute delirium 03/23/2018   Difficulty urinating 02/03/2018    Peripheral neuropathy 05/07/2017   Folate deficiency 05/07/2017   Pleural effusion, left 04/12/2016   Peripheral edema 02/29/2016   Hoarseness of voice 11/23/2015   Essential hypertension, benign 10/20/2015   Schizoaffective disorder, depressive type ([place]) 02/17/2015   Vitamin D deficiency 02/14/2015   Low back pain 10/27/2014   Dyspnea on exertion 07/19/2014   Acute upper back pain 05/21/2013   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 SERVICES [address] [place], [place], [address] Phone: [phone]   Fax:  [phone]  Name: Jeffrey Lin MRN: [id number] Date of Birth: 1976/01/06

## 2020-12-12 ENCOUNTER — Encounter: Payer: Medicare Other | Admitting: Occupational Therapy

## 2020-12-20 ENCOUNTER — Encounter: Payer: Medicare Other | Admitting: Occupational Therapy

## 2020-12-22 ENCOUNTER — Encounter: Payer: Medicare Other | Admitting: Occupational Therapy

## 2020-12-27 ENCOUNTER — Encounter: Payer: Medicare Other | Admitting: Occupational Therapy

## 2020-12-29 ENCOUNTER — Encounter: Payer: Medicare Other | Admitting: Occupational Therapy

## 2021-01-03 ENCOUNTER — Encounter: Payer: Medicare Other | Admitting: Occupational Therapy

## 2021-01-05 ENCOUNTER — Encounter: Payer: Medicare Other | Admitting: Occupational Therapy

## 2021-01-10 ENCOUNTER — Encounter: Payer: Medicare Other | Admitting: Occupational Therapy

## 2021-01-26 ENCOUNTER — Ambulatory Visit: Payer: Medicare Other | Attending: Physician Assistant | Admitting: Occupational Therapy

## 2021-01-26 ENCOUNTER — Other Ambulatory Visit: Payer: Self-pay

## 2021-01-26 DIAGNOSIS — I890 Lymphedema, not elsewhere classified: Secondary | ICD-10-CM | POA: Insufficient documentation

## 2021-01-26 NOTE — Therapy (Signed)
[place] MAIN [facility] SERVICES [address] [place], [place], [address] Phone: [phone]   Fax:  [phone]  Occupational Therapy Treatment  Patient Details  Name: Jeffrey Lin MRN: [id number] Date of Birth: 25-Aug-1975 Referring Provider (OT): Jeri Cos, [place]   Encounter Date: 01/26/2021   OT End of Session - 01/26/21 1151     Visit Number 25    Number of Visits 36    Date for OT Re-Evaluation 03/08/21    Authorization Type 10th visit progress note completed 9/8/ 22 , Reporting Period 08/30/20- 09/27/20    OT Start Time 1104    OT Stop Time 1142    OT Time Calculation (min) 38 min    Activity Tolerance Patient tolerated treatment well;No increased pain    Behavior During Therapy [facility] for tasks assessed/performed             Past Medical History:  Diagnosis Date   Anxiety    Chronic leg pain    Depression    Essential hypertension, benign 10/20/2015   Schizoaffective disorder [facility])     Past Surgical History:  Procedure Laterality Date   Block procedure at pain center  03/27/06   Bone graft from hip to jaw  2004   Bone graft right side of head to jaw  2006   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   Subjective Assessment - 01/26/21 1156     Subjective  Jeffrey Lin presents for OT  treatment visit 25/ 36 to address BLE lymphedema. He presents wearing custom compression knee highs. He denies LE-related leg pain this morning. Pt is here for f/u. He was last seen on 12/08/20 for follow along care.    Patient is accompanied by: Family member    Pertinent History H&P relevant to BLE lymphedema:   tobacco abuse, HTN, hx BLE  ulcers, hx cellulitis, Hx DVT, Hx patellar fx, chronic BLE pain, chronic BLE lymphedema, neuropathy, Hx pneumonia, snoring. chronic diastolic heart failure, sinus tachycardia, chronic back pain, psychotic mood disorder/ Schitzoaffective disorder- depressive type, snoring    Limitations chronic leg pain and swelling, decreased balance,    Repetition Increases Symptoms    Special Tests Intake FOTO: 45/100    Patient Stated Goals get pain and swelling down in my legs    Currently in Pain? No/denies    Pain Onset More than a month ago   approximately 4 years ago   Pain Onset More than a month ago                          OT Treatments/Exercises (OP) - 01/26/21 1157       ADLs   ADL Education Given Yes      Manual Therapy   Manual Therapy Edema management    Manual therapy comments skin care with low ph castor oil to increase skin hydration and limit infection risk    Edema Management custom compression garment assessment. Skin assessment. Swelling assessment. self-care assessment    Compression Bandaging Pt edu for "IT STAY" garment adhesive to assist w keeping open edge at toes in place   when  wearing shoes;.                    OT Education - 01/26/21 1159     Education Details Continued skilled Pt/caregiver education  And LE ADL training throughout visit for lymphedema self care/ home program, including compression wrapping, compression garment and device wear/care, lymphatic pumping ther ex, simple self-MLD, and skin care. Discussed initial volumetric measurements. Discussed all long term goals.    Person(s) Educated Patient;Parent(s)    Methods Explanation;Demonstration;Handout    Comprehension Verbalized understanding;Returned demonstration                 OT Long Term Goals - 11/10/20 1059       OT LONG TERM GOAL #1   Title Patients intake functional score on the FOTO tool is 45 out of 100 (higher number = greater function). Given this  patients comorbidities she will experience at least an increase in function of 4 points, or higher, to increase level of independence with basic ADLs and functional ambulation and mobility.    Baseline 45/100    Time 12    Period Weeks    Status Achieved   final score up 29 points from 45/100 to 74/100. Goal met     OT LONG TERM GOAL #2   Title Pt will be able to apply multi-layer, short stretch compression wraps to one leg at a time using correct gradient techniques with maximum caregiver assistance in an effort  to return the affected  limb(s)  to premorbid size and shape, to limit pain and infection risk, and to improve functional mobility and ambulation  for ADLs.    Baseline dependent    Time 4    Period Days    Status Achieved   Goal met and exceeded. Pt independent with applying wraps and laundering them.     OT LONG TERM GOAL #3   Title Pt will achieve at least 10% limb volume reduction Bilaterally during Intensive Phase CDT to prevent re-accumulation of lymphatic congestion and progression of fibrosis, to limit infection risk, to improve functional ambulation and transfers, and to promote wound healing.    Baseline dependent    Time 12    Period Weeks    Status Partially Met   Met for LLE w/ 22.14% reduction measured 09/20/20. To date RLE is treduced ~ 5% from ankle to tibial tuberosity.   Target Date 11/22/20      OT LONG TERM GOAL #4   Title With max caregiver assistance Pt will achieve and sustain a least 85% compliance with all LE self-care home program components throughout Intensive Phase CDT, including elevation when seated, daily skin inspection and care, lymphatic pumping ther ex, 23/7 compression wraps and simple self-MLD. Max caregiver assistance is necessary to ensure optimal limb volume reduction, to limit infection risk and to limit LE progression. Pt will arrange for assistance before we begin CDT.    Baseline dependent    Time 12    Period Weeks    Status Achieved    Met and exceeded. Pt remains 100% compliant with all LE self-care home program components     OT LONG TERM GOAL #5   Title Using assistive devices (modified independence) Pt will be able to don and doff appropriate daytime compression garments to limit lymphatic re-accumulation and LE progression before transitioning to self-management phase of CDT.Using assistive devices (modified independence) Pt will be able to don and doff appropriate daytime compression garments to limit lymphatic re-accumulation  and LE progression before transitioning to self-management phase of CDT.    Baseline Max A    Time 12    Period Weeks    Status Achieved   LLE custom garment and HOS device ordered. Awaiting fitting.                  Plan - 01/26/21 1151     Clinical Impression Statement Jeffrey Lin seen 11/18 for follow up. Custom garments remain in good conmdition with normal wear. We were not able to complete new measurements for replacement garments today due to recent unexpected expense at home. He would like to get a second pair so he will try to save money between now and the end of February whren he'll return for new measurements. Lymphedema is very well controlled.  Swelling is managed well and pain is at a minimum. Skin is somewhat dry and flakey again today. Encouraged Pt to continue with daily skin care. Pt remains 100% compliant with all daily LE self care home program componenets. We'll see him back in February for assistance with custom garment replacements. Cont F/u PRN for chronic, progressive , BLE lymphedema.    OT Occupational Profile and History Comprehensive Assessment- Review of records and extensive additional review of physical, cognitive, psychosocial history related to current functional performance    Occupational performance deficits (Please refer to evaluation for details): IADL's;ADL's;Rest and Sleep;Work;Leisure;Social Participation   w max CG assist for home program   Body  Structure / Function / Physical Skills ADL;Edema;Skin integrity;Pain;Decreased knowledge of precautions;Decreased knowledge of use of DME;IADL    Rehab Potential Good    Clinical Decision Making Several treatment options, min-mod task modification necessary    Comorbidities Affecting Occupational Performance: Presence of comorbidities impacting occupational performance    Comorbidities impacting occupational performance description: suspected CVI, hx cellulitis, hx dvt    Modification or Assistance to Complete Evaluation  Min-Moderate modification of tasks or assist with assess necessary to complete eval    OT Frequency 2x / week    OT Duration 12 weeks   and PRN   OT Treatment/Interventions Self-care/ADL training;Therapeutic exercise;Manual Therapy;Coping strategies training;Therapeutic activities;Manual lymph drainage;DME and/or AE instruction;Compression bandaging;Other (comment);Patient/family education   skin care with low ph castor oil and/ or eucerin lotion   Plan Intensive and Self-Management Phases of Complete Decongestive Therapy (CDT) to include manual lymphatic drainage, skin care, compression wraps and garments, therapeutic exercise, caregiver training    Recommended Other Services fit with BLE, knee length, custom compression garments and HOS devices needed to limit worsening fibrosis formation during HOS. Consider fitting with advanced, sequential , pneumatic compression device, or Flexitouch "pump" medically necessary for optimal level of independence with LE self-management at home over time    Consulted and Agree with Plan of Care Patient;Family member/caregiver             Patient will benefit from skilled therapeutic intervention in order to improve the following deficits and impairments:   Body Structure / Function / Physical Skills: ADL, Edema, Skin integrity, Pain, Decreased knowledge of precautions, Decreased knowledge of use of DME, IADL       Visit  Diagnosis: Lymphedema, not elsewhere classified    Problem List Patient Active Problem List   Diagnosis Date Noted   Ulcers of both lower legs ([place]) 09/09/2019   Lymphedema of both lower extremities 09/09/2019   Sinus tachycardia 08/06/2019   Chronic diastolic heart failure (HCC)    Benign prostatic hyperplasia with lower urinary tract symptoms  07/02/2019   Snoring 07/02/2019   Cellulitis of left leg 05/15/2019   Left leg cellulitis 05/11/2019   Swelling abdomen 05/11/2019   Thiamine deficiency 12/28/2018   Pneumonia 03/28/2018   Subacute delirium 03/23/2018   Difficulty urinating 02/03/2018   Peripheral neuropathy 05/07/2017   Folate deficiency 05/07/2017   Pleural effusion, left 04/12/2016   Peripheral edema 02/29/2016   Hoarseness of voice 11/23/2015   Essential hypertension, benign 10/20/2015   Schizoaffective disorder, depressive type ([place]) 02/17/2015   Vitamin D deficiency 02/14/2015   Low back pain 10/27/2014   Dyspnea on exertion 07/19/2014   Acute upper back pain 05/21/2013   Psychotic mood disorder ([place]) 05/14/2012   History of DVT (deep vein thrombosis) 07/06/2010   History of patellar fracture 07/06/2010   HYPERCHOLESTEROLEMIA 10/13/2009   PREDIABETES 10/13/2009   Anxiety state 06/02/2006   TOBACCO ABUSE 06/02/2006   Chronic back pain 06/02/2006   Andrey Spearman, MS, OTR/L, CLT-LANA 01/26/21 12:00 PM    [place] MAIN [facility] SERVICES [address] [place], [place], [address] Phone: [phone]   Fax:  [phone]  Name: JHOVANY WEIDINGER MRN: [id number] Date of Birth: [birth year]

## 2021-01-26 NOTE — Patient Instructions (Signed)

## 2021-02-02 ENCOUNTER — Other Ambulatory Visit: Payer: Self-pay | Admitting: Family Medicine

## 2021-02-02 NOTE — Telephone Encounter (Signed)
Last office visit 03/24/2020 for leg/foot swelling.  Last refilled 10/06/2020 for #[id number] with no refills.  No future appointments with PCP.

## 2021-02-15 ENCOUNTER — Telehealth: Payer: Self-pay | Admitting: Family Medicine

## 2021-02-15 NOTE — Telephone Encounter (Signed)
LVM for pt to rtn my call to schedule AWV with [facility].  

## 2021-02-22 ENCOUNTER — Encounter: Payer: Self-pay | Admitting: Podiatry

## 2021-02-22 ENCOUNTER — Other Ambulatory Visit: Payer: Self-pay

## 2021-02-22 ENCOUNTER — Ambulatory Visit (INDEPENDENT_AMBULATORY_CARE_PROVIDER_SITE_OTHER): Payer: Medicare Other | Admitting: Podiatry

## 2021-02-22 VITALS — BP 128/88 | HR 103 | Temp 97.7°F | Resp 16

## 2021-02-22 DIAGNOSIS — S90111A Contusion of right great toe without damage to nail, initial encounter: Secondary | ICD-10-CM | POA: Diagnosis not present

## 2021-02-22 NOTE — Progress Notes (Signed)
Subjective:  Patient ID: Jeffrey Lin, male    DOB: 02/29/1975,  MRN: [id number]  Chief Complaint  Patient presents with   Toe Pain    "It feels like there may be an ingrown toenail on my right big toe.    46 y.o. male presents with the above complaint.  Patient presents with pain to the right distal tip of the hallux.  Patient states is painful to touch painful to walk on.  He got a new pair of shoes about few months ago and states that he has been noticing more more soreness.  He wanted get it evaluated.  He thinks it may be an ingrown.  He denies seeing anyone else prior to seeing me.  He states he may have stubbed his toe a little bit too.  He has not noticed any other issues.  He would like to discuss treatment options for this.   Review of Systems: Negative except as noted in the HPI. Denies N/V/F/Ch.  Past Medical History:  Diagnosis Date   Anxiety    Chronic leg pain    Depression    Essential hypertension, benign 10/20/2015   Schizoaffective disorder (HCC)     Current Outpatient Medications:    amphetamine-dextroamphetamine (ADDERALL) 20 MG tablet, Take 20 mg by mouth in the morning, at noon, and at bedtime., Disp: , Rfl:    atorvastatin (LIPITOR) 40 MG tablet, TAKE 1 TABLET BY MOUTH EVERY DAY, Disp: 90 tablet, Rfl: 3   carvedilol (COREG) 25 MG tablet, Take 37.5 mg by mouth 2 (two) times daily with a meal., Disp: , Rfl:    cephALEXin (KEFLEX) 500 MG capsule, Take 1 capsule (500 mg total) by mouth 3 (three) times daily., Disp: 21 capsule, Rfl: 0   diazepam (VALIUM) 10 MG tablet, Take 10 mg by mouth 4 (four) times daily., Disp: , Rfl:    gabapentin (NEURONTIN) 600 MG tablet, Take 1,200 mg by mouth 3 (three) times daily. , Disp: , Rfl:    losartan (COZAAR) 100 MG tablet, TAKE 1/2 TABLET BY MOUTH DAILY, Disp: 45 tablet, Rfl: 0   potassium chloride (KLOR-CON) 10 MEQ tablet, TAKE 1 TABLET BY MOUTH EVERY DAY, Disp: 90 tablet, Rfl: 1   tamsulosin (FLOMAX) 0.4 MG CAPS capsule, Take  1 capsule (0.4 mg total) by mouth daily., Disp: 90 capsule, Rfl: 3   tiZANidine (ZANAFLEX) 2 MG tablet, TAKE 1 TABLET (2 MG TOTAL) BY MOUTH 3 (THREE) TIMES DAILY AS NEEDED FOR MUSCLE SPASMS., Disp: 270 tablet, Rfl: 0   torsemide (DEMADEX) 20 MG tablet, Take 2 tablets (40 mg total) by mouth daily., Disp: 60 tablet, Rfl: 2   Vitamin D, Ergocalciferol, (DRISDOL) 50000 units CAPS capsule, Take 1 capsule (50,000 Units total) by mouth 2 (two) times a week., Disp: , Rfl:   Social History   Tobacco Use  Smoking Status Every Day   Packs/day: 1.50   Years: 23.00   Pack years: 34.50   Types: Cigarettes   Start date: 09/22/1995  Smokeless Tobacco Former  Tobacco Comments   Pt states that he is cutting back - currently at 1/2 PPD  (as of 07/27/19)    Allergies  Allergen Reactions   Toradol [Ketorolac Tromethamine] Swelling   Lamotrigine Rash   Objective:   Vitals:   02/22/21 1413  BP: 128/88  Pulse: (!) 103  Resp: 16  Temp: 97.7 F (36.5 C)   There is no height or weight on file to calculate BMI. Constitutional Well developed. Well nourished.  Vascular Dorsalis pedis pulses palpable bilaterally. Posterior tibial pulses palpable bilaterally. Capillary refill normal to all digits.  No cyanosis or clubbing noted. Pedal hair growth normal.  Neurologic Normal speech. Oriented to person, place, and time. Epicritic sensation to light touch grossly present bilaterally.  Dermatologic Pain on palpation to the distal tip of the hallux.  No ulceration noted.  No redness noted.  No infection noted.  No breakdown of the skin noted.  Nail plate and bed are well adhered.  No signs of trauma noted.  Orthopedic: Normal joint ROM without pain or crepitus bilaterally. No visible deformities. No bony tenderness.   Radiographs: None Assessment:   1. Contusion of right great toe without damage to nail, initial encounter    Plan:  Patient was evaluated and treated and all questions answered.  Right  hallux distal tip contusion -All questions and concerns were discussed with the patient in extensive detail -I believe patient may have had a little bit of microtrauma from shoe gear modification and therefore led to a lot of pressure to the distal tip of the hallux.  I believe patient will benefit from surgical shoe to take the pressure off of the distal tip of the hallux.  At this time I am not clinically concerned for any fractures or any open ulcerations or sores.  Patient states understanding -Surgical shoe was dispensed  No follow-ups on file.

## 2021-03-09 ENCOUNTER — Encounter: Payer: Medicare Other | Admitting: Occupational Therapy

## 2021-03-16 ENCOUNTER — Other Ambulatory Visit: Payer: Self-pay

## 2021-03-16 ENCOUNTER — Ambulatory Visit: Payer: Medicare Other | Attending: Physician Assistant | Admitting: Occupational Therapy

## 2021-03-16 DIAGNOSIS — I890 Lymphedema, not elsewhere classified: Secondary | ICD-10-CM | POA: Diagnosis not present

## 2021-03-16 NOTE — Therapy (Signed)
[place] MAIN [facility] SERVICES [address] [place], [place], [address] Phone: [phone]   Fax:  [phone]  Occupational Therapy Treatment: Lymphedema Care  Patient Details  Name: Jeffrey Lin MRN: [id number] Date of Birth: [birth year] Referring Provider (OT): Jeri Cos, [place]   Encounter Date: 03/16/2021   OT End of Session - 03/16/21 1014     Visit Number 26    Number of Visits 36    Date for OT Re-Evaluation 06/14/21    Authorization Type --    OT Start Time 0803    OT Stop Time 0910    OT Time Calculation (min) 67 min    Activity Tolerance Patient tolerated treatment well;No increased pain    Behavior During Therapy [facility] for tasks assessed/performed             Past Medical History:  Diagnosis Date   Anxiety    Chronic leg pain    Depression    Essential hypertension, benign 10/20/2015   Schizoaffective disorder [facility])     Past Surgical History:  Procedure Laterality Date   Block procedure at pain center  03/27/06   Bone graft from hip to jaw  2004   Bone graft right side of head to jaw  2006   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                 OT Treatments/Exercises (OP) - 03/16/21 1038       ADLs   ADL Education Given Yes      Manual Therapy   Manual Therapy Edema management    Edema Management BLE anatomical measurements for replacement compression garments                    OT Education - 03/16/21 1039     Education Details Continued skilled Pt/caregiver education  And LE ADL training throughout visit for lymphedema self care/ home program, including compression wrapping,  compression garment and device wear/care, lymphatic pumping ther ex, simple self-MLD, and skin care. Discussed initial volumetric measurements. Discussed all long term goals.    Person(s) Educated Patient;Parent(s)    Methods Explanation;Demonstration;Handout    Comprehension Verbalized understanding;Returned demonstration                 OT Long Term Goals - 11/10/20 1059       OT LONG TERM GOAL #1   Title Patients intake functional score on the FOTO tool is 45 out of 100 (higher number = greater function). Given this patients comorbidities she will experience at least an increase in function of 4 points, or higher, to increase level of independence with basic ADLs and functional ambulation and mobility.    Baseline 45/100    Time 12    Period Weeks    Status Achieved   final score up 29 points from 45/100 to 74/100. Goal met     OT LONG TERM GOAL #2   Title Pt will be able to apply multi-layer, short stretch compression wraps to one leg at a time using correct gradient techniques with maximum caregiver assistance in an effort  to return the affected  limb(s)  to premorbid size and shape, to limit pain and infection risk, and to improve functional mobility and ambulation  for ADLs.    Baseline dependent    Time 4    Period Days    Status Achieved   Goal met and exceeded. Pt independent with applying wraps and laundering them.     OT LONG TERM GOAL #3   Title Pt will achieve at least 10% limb volume reduction Bilaterally during Intensive Phase CDT to prevent re-accumulation of lymphatic congestion and progression of fibrosis, to limit infection risk, to improve functional ambulation and transfers, and to promote wound healing.    Baseline dependent    Time 12    Period Weeks    Status Partially Met   Met for LLE w/ 22.14% reduction measured 09/20/20. To date RLE is treduced ~ 5% from ankle to tibial tuberosity.   Target Date 11/22/20      OT LONG TERM GOAL #4   Title With max  caregiver assistance Pt will achieve and sustain a least 85% compliance with all LE self-care home program components throughout Intensive Phase CDT, including elevation when seated, daily skin inspection and care, lymphatic pumping ther ex, 23/7 compression wraps and simple self-MLD. Max caregiver assistance is necessary to ensure optimal limb volume reduction, to limit infection risk and to limit LE progression. Pt will arrange for assistance before we begin CDT.    Baseline dependent    Time 12    Period Weeks    Status Achieved   Met and exceeded. Pt remains 100% compliant with all LE self-care home program components     OT LONG TERM GOAL #5   Title Using assistive devices (modified independence) Pt will be able to don and doff appropriate daytime compression garments to limit lymphatic re-accumulation and LE progression before transitioning to self-management phase of CDT.Using assistive devices (modified independence) Pt will be able to don and doff appropriate daytime compression garments to limit lymphatic re-accumulation and LE progression before transitioning to self-management phase of CDT.    Baseline Max A    Time 12    Period Weeks    Status Achieved   LLE custom garment and HOS device ordered. Awaiting fitting.                  Plan - 03/16/21 1034     Clinical Impression Statement Mr. Commons was last seen for OT follow along for lymphedema care on 01/26/21. Pt is managing swelling and skin condition excellently at home. His custom garments are showing signs of wear and need replacement. We completed new measurements, all of which are smaller by ~ 2 cm revealing further limb volume reductions bilaterally over interval. We submitted replace,ment measurements to DME vendor and requested processing. Pt will return for fitting ASAP.    OT Occupational Profile and History Comprehensive Assessment- Review of records and extensive additional review of physical, cognitive,  psychosocial history related to current functional performance    Occupational performance deficits (Please refer to evaluation for details): IADL's;ADL's;Rest and Sleep;Work;Leisure;Social Participation   w max CG assist for home program   Body Structure / Function / Physical Skills ADL;Edema;Skin integrity;Pain;Decreased knowledge of precautions;Decreased knowledge of use of DME;IADL    Rehab Potential Good    Clinical Decision Making Several treatment options, min-mod task modification necessary    Comorbidities Affecting Occupational Performance: Presence of comorbidities impacting occupational performance    Comorbidities impacting occupational performance description: suspected CVI, hx cellulitis, hx  dvt    Modification or Assistance to Complete Evaluation  Min-Moderate modification of tasks or assist with assess necessary to complete eval    OT Frequency Other (comment)   follow along PRN   OT Duration Other (comment)   PRN   OT Treatment/Interventions Self-care/ADL training;Therapeutic exercise;Manual Therapy;Coping strategies training;Therapeutic activities;Manual lymph drainage;DME and/or AE instruction;Compression bandaging;Other (comment);Patient/family education   skin care with low ph castor oil and/ or eucerin lotion   Plan Intensive and Self-Management Phases of Complete Decongestive Therapy (CDT) to include manual lymphatic drainage, skin care, compression wraps and garments, therapeutic exercise, caregiver training    Recommended Other Services fit with BLE, knee length, custom compression garments and HOS devices needed to limit worsening fibrosis formation during HOS. Consider fitting with advanced, sequential , pneumatic compression device, or Flexitouch "pump" medically necessary for optimal level of independence with LE self-management at home over time    Consulted and Agree with Plan of Care Patient;Family member/caregiver             Patient will benefit from skilled  therapeutic intervention in order to improve the following deficits and impairments:   Body Structure / Function / Physical Skills: ADL, Edema, Skin integrity, Pain, Decreased knowledge of precautions, Decreased knowledge of use of DME, IADL       Visit Diagnosis: Lymphedema, not elsewhere classified - Plan: Ot plan of care cert/re-cert    Problem List Patient Active Problem List   Diagnosis Date Noted   Ulcers of both lower legs ([place]) 09/09/2019   Lymphedema of both lower extremities 09/09/2019   Sinus tachycardia 08/06/2019   Chronic diastolic heart failure (HCC)    Benign prostatic hyperplasia with lower urinary tract symptoms 07/02/2019   Snoring 07/02/2019   Cellulitis of left leg 05/15/2019   Left leg cellulitis 05/11/2019   Swelling abdomen 05/11/2019   Thiamine deficiency 12/28/2018   Pneumonia 03/28/2018   Subacute delirium 03/23/2018   Difficulty urinating 02/03/2018   Peripheral neuropathy 05/07/2017   Folate deficiency 05/07/2017   Pleural effusion, left 04/12/2016   Peripheral edema 02/29/2016   Hoarseness of voice 11/23/2015   Essential hypertension, benign 10/20/2015   Schizoaffective disorder, depressive type ([place]) 02/17/2015   Vitamin D deficiency 02/14/2015   Low back pain 10/27/2014   Dyspnea on exertion 07/19/2014   Acute upper back pain 05/21/2013   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 [place], [place], [address] Phone: [phone]   Fax:  [phone]  Name: ACIE CUSTIS MRN: [id number] Date of Birth: 01-28-1975

## 2021-04-05 ENCOUNTER — Ambulatory Visit: Payer: Medicare Other | Admitting: Podiatry

## 2021-04-18 ENCOUNTER — Ambulatory Visit: Payer: Medicare Other | Attending: Physician Assistant | Admitting: Occupational Therapy

## 2021-04-18 ENCOUNTER — Other Ambulatory Visit: Payer: Self-pay

## 2021-04-18 DIAGNOSIS — I890 Lymphedema, not elsewhere classified: Secondary | ICD-10-CM | POA: Insufficient documentation

## 2021-04-18 NOTE — Therapy (Signed)
[place] ?[place] [facility] RE[facility] SERVICES ?[place], [place], [address] ?Phone: [phone]   Fax:  [phone] ? ?Occupational Therapy Treatment ? ?Patient Details  ?Name: Jeffrey Lin ?MRN: [id number] ?Date of Birth: [birth year] ?Referring Provider (OT): Jeri Cos, PA-C ? ? ?Encounter Date: 04/18/2021 ? ? OT End of Session - 04/18/21 0904   ? ? Visit Number 27   ? Number of Visits 36   ? Date for OT Re-Evaluation 06/14/21   ? OT Start Time 0800   ? OT Stop Time 0900   ? OT Time Calculation (min) 60 min   ? Activity Tolerance Patient tolerated treatment well;No increased pain   ? Behavior During Therapy [facility] for tasks assessed/performed   ? ?  ?  ? ?  ? ? ?Past Medical History:  ?Diagnosis Date  ? Anxiety   ? Chronic leg pain   ? Depression   ? Essential hypertension, benign 10/20/2015  ? Schizoaffective disorder ([place])   ? ? ?Past Surgical History:  ?Procedure Laterality Date  ? Block procedure at pain center  03/27/06  ? Bone graft from hip to jaw  2004  ? Bone graft right side of head to jaw  2006  ? 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 RIGHT HEART CATH AND CORONARY ANGIOGRAPHY Right 08/02/2019  ? Procedure: RIGHT HEART CATH AND CORONARY ANGIOGRAPHY;  Surgeon: Wellington Hampshire, MD;  Location: [place] [facility];  Service: Cardiovascular;  Laterality: Right;  ? Sphenopalatine ganglionic    ? ? ?There were no vitals filed for this visit. ? ? Subjective Assessment - 04/18/21 0908   ? ? Subjective  Jeffrey Lin presents for OT  treatment visit 26/ 36 to address BLE lymphedema. He presents with existing custom compression knee highs in place. Marland Kitchen He denies LE-related leg pain this morning. Pt is here for replacement garment fitting.   ? Patient is accompanied by: Family member   ? Pertinent History H&P relevant to BLE lymphedema:   tobacco abuse, HTN, hx BLE ulcers, hx cellulitis, Hx DVT, Hx patellar fx, chronic BLE pain, chronic BLE lymphedema, neuropathy, Hx pneumonia,  snoring. chronic diastolic heart failure, sinus tachycardia, chronic back pain, psychotic mood disorder/ Schitzoaffective disorder- depressive type, snoring   ? Limitations chronic leg pain and swelling, decreased balance,   ? Repetition Increases Symptoms   ? Special Tests Intake FOTO: 45/100   ? Patient Stated Goals get pain and swelling down in my legs   ? Currently in Pain? No/denies   ? Pain Onset More than a month ago   approximately 4 years ago  ? Pain Onset More than a month ago   ? ?  ?  ? ?  ? ? ? ? ? ? ? ? ? ? ? ? ? ? ? OT Treatments/Exercises (OP) - 04/18/21 0909   ? ?  ? ADLs  ? ADL Education Given Yes   ?  ? Manual Therapy  ? Manual Therapy Edema management   ? Edema Management initial  fitting for replacement garments. Too long bilaterally. Need remakes.   ? ?  ?  ? ?  ? ? ? ? ? ? ? ? ? OT Education - 04/18/21 0910   ? ? Education Details Continued skilled Pt/caregiver education  And LE ADL training throughout visit for lymphedema self care/ home program, including compression wrapping, compression garment and device wear/care, lymphatic pumping ther ex, simple self-MLD, and skin care. Discussed initial  volumetric measurements. Discussed all long term goals.   ? Person(s) Educated Patient;Parent(s)   ? Methods Explanation;Demonstration;Handout   ? Comprehension Verbalized understanding;Returned demonstration   ? ?  ?  ? ?  ? ? ? ? ? ? OT Long Term Goals - 11/10/20 1059   ? ?  ? OT LONG TERM GOAL #1  ? Title Patient?s intake functional score on the FOTO tool is 45 out of 100 (higher number = greater function). Given this patient?s comorbidities she will experience at least an increase in function of 4 points, or higher, to increase level of independence with basic ADLs and functional ambulation and mobility.   ? Baseline 45/100   ? Time 12   ? Period Weeks   ? Status Achieved   final score up 29 points from 45/100 to 74/100. Goal met  ?  ? OT LONG TERM GOAL #2  ? Title Pt will be able to apply  multi-layer, short stretch compression wraps to one leg at a time using correct gradient techniques with maximum caregiver assistance in an effort  to return the affected  limb(s)  to premorbid size and shape, to limit pain and infection risk, and to improve functional mobility and ambulation  for ADLs.   ? Baseline dependent   ? Time 4   ? Period Days   ? Status Achieved   Goal met and exceeded. Pt independent with applying wraps and laundering them.  ?  ? OT LONG TERM GOAL #3  ? Title Pt will achieve at least 10% limb volume reduction Bilaterally during Intensive Phase CDT to prevent re-accumulation of lymphatic congestion and progression of fibrosis, to limit infection risk, to improve functional ambulation and transfers, and to promote wound healing.   ? Baseline dependent   ? Time 12   ? Period Weeks   ? Status Partially Met   Met for LLE w/ 22.14% reduction measured 09/20/20. To date RLE is treduced ~ 5% from ankle to tibial tuberosity.  ? Target Date 11/22/20   ?  ? OT LONG TERM GOAL #4  ? Title With max caregiver assistance Pt will achieve and sustain a least 85% compliance with all LE self-care home program components throughout Intensive Phase CDT, including elevation when seated, daily skin inspection and care, lymphatic pumping ther ex, 23/7 compression wraps and simple self-MLD. Max caregiver assistance is necessary to ensure optimal limb volume reduction, to limit infection risk and to limit LE progression. Pt will arrange for assistance before we begin CDT.   ? Baseline dependent   ? Time 12   ? Period Weeks   ? Status Achieved   Met and exceeded. Pt remains 100% compliant with all LE self-care home program components  ?  ? OT LONG TERM GOAL #5  ? Title Using assistive devices (modified independence) Pt will be able to don and doff appropriate daytime compression garments to limit lymphatic re-accumulation and LE progression before transitioning to self-management phase of CDT.Using assistive devices  (modified independence) Pt will be able to don and doff appropriate daytime compression garments to limit lymphatic re-accumulation and LE progression before transitioning to self-management phase of CDT.   ? Baseline Max A   ? Time 12   ? Period Weeks   ? Status Achieved   LLE custom garment and HOS device ordered. Awaiting fitting.  ? ?  ?  ? ?  ? ? ? ? ? ? ? ? Plan - 04/18/21 0905   ? ? Clinical  Impression Statement Mr. Weckwerth is managing swelling and skin condition between visits very well.  Fitted initial replacements for custom, flat knit, Elvarex knee length , ccl 3 ( 36-46 mmHg) compression garments today ordered in February.  Both garments are too lobng at top by what appears to be width of 5 cm bands. Checked measurements and they are A-OK. Took photos and sent to DME vendor with request for remakes after session. Will call Pt to refit when these arrive. Cont as per POC.We submitted replace,ment measurements to DME vendor and requested processing. Pt will return for fitting ASAP.   ? OT Occupational Profile and History Comprehensive Assessment- Review of records and extensive additional review of physical, cognitive, psychosocial history related to current functional performance   ? Occupational performance deficits (Please refer to evaluation for details): IADL's;ADL's;Rest and Sleep;Work;Leisure;Social Participation   w max CG assist for home program  ? Body Structure / Function / Physical Skills ADL;Edema;Skin integrity;Pain;Decreased knowledge of precautions;Decreased knowledge of use of DME;IADL   ? Rehab Potential Good   ? Clinical Decision Making Several treatment options, min-mod task modification necessary   ? Comorbidities Affecting Occupational Performance: Presence of comorbidities impacting occupational performance   ? Comorbidities impacting occupational performance description: suspected CVI, hx cellulitis, hx dvt   ? Modification or Assistance to Complete Evaluation  Min-Moderate  modification of tasks or assist with assess necessary to complete eval   ? OT Frequency Other (comment)   follow along PRN  ? OT Duration Other (comment)   PRN  ? OT Treatment/Interventions Self-care/ADL training;Therap

## 2021-04-25 ENCOUNTER — Ambulatory Visit (INDEPENDENT_AMBULATORY_CARE_PROVIDER_SITE_OTHER): Payer: Medicare Other

## 2021-04-25 VITALS — Ht 78.0 in | Wt 280.0 lb

## 2021-04-25 DIAGNOSIS — Z0000 Encounter for general adult medical examination without abnormal findings: Secondary | ICD-10-CM

## 2021-04-25 NOTE — Progress Notes (Signed)
? ?Subjective:  ? Jeffrey Lin is a 46 y.o. male who presents for Medicare Annual/Subsequent preventive examination. ? ?I connected with Jeffrey Lin today by telephone and verified that I am speaking with the correct person using two identifiers. ?Location patient: home ?Location provider: work ?Persons participating in the virtual visit: patient, nurse.  ?  ?I discussed the limitations, risks, security and privacy concerns of performing an evaluation and management service by telephone and the availability of in person appointments. I also discussed with the patient that there may be a patient responsible charge related to this service. The patient expressed understanding and verbally consented to this telephonic visit.  ?  ?Interactive audio and video telecommunications were attempted between this provider and patient, however failed, due to patient having technical difficulties OR patient did not have access to video capability.  We continued and completed visit with audio only. ? ?Some vital signs may be absent or patient reported.  ? ?Time Spent with patient on telephone encounter: 20 minutes ? ?Review of Systems    ? ?Cardiac Risk Factors include: advanced age (>64men, >3 women);hypertension ? ?   ?Objective:  ?  ?Today's Vitals  ? 04/25/21 1509  ?Weight: 280 lb (127 kg)  ?Height: 6\' 6"  (1.981 m)  ? ?Body mass index is 32.36 kg/m?. ? ? ?  04/25/2021  ?  3:26 PM 08/24/2020  ? 11:37 AM 06/10/2019  ?  9:49 PM 05/28/2019  ? 12:43 PM 05/15/2019  ?  8:00 PM 05/15/2019  ?  2:48 PM 01/08/2019  ?  8:22 AM  ?Advanced Directives  ?Does Patient Have a Medical Advance Directive? No No No No No No No  ?Would patient like information on creating a medical advance directive? Yes (MAU/Ambulatory/Procedural Areas - Information given) No - Patient declined No - Patient declined  No - Patient declined    ? ? ?Current Medications (verified) ?Outpatient Encounter Medications as of 04/25/2021  ?Medication Sig  ? atorvastatin (LIPITOR)  40 MG tablet TAKE 1 TABLET BY MOUTH EVERY DAY  ? carvedilol (COREG) 25 MG tablet Take 37.5 mg by mouth 2 (two) times daily with a meal.  ? cephALEXin (KEFLEX) 500 MG capsule Take 1 capsule (500 mg total) by mouth 3 (three) times daily.  ? diazepam (VALIUM) 10 MG tablet Take 10 mg by mouth 4 (four) times daily.  ? gabapentin (NEURONTIN) 600 MG tablet Take 1,200 mg by mouth 3 (three) times daily.   ? losartan (COZAAR) 100 MG tablet TAKE 1/2 TABLET BY MOUTH DAILY  ? methylphenidate (RITALIN) 20 MG tablet Take 20 mg by mouth 3 (three) times daily.  ? potassium chloride (KLOR-CON) 10 MEQ tablet TAKE 1 TABLET BY MOUTH EVERY DAY  ? tamsulosin (FLOMAX) 0.4 MG CAPS capsule Take 1 capsule (0.4 mg total) by mouth daily.  ? tiZANidine (ZANAFLEX) 2 MG tablet TAKE 1 TABLET (2 MG TOTAL) BY MOUTH 3 (THREE) TIMES DAILY AS NEEDED FOR MUSCLE SPASMS.  ? torsemide (DEMADEX) 20 MG tablet Take 2 tablets (40 mg total) by mouth daily.  ? Vitamin D, Ergocalciferol, (DRISDOL) 50000 units CAPS capsule Take 1 capsule (50,000 Units total) by mouth 2 (two) times a week.  ? amphetamine-dextroamphetamine (ADDERALL) 20 MG tablet Take 20 mg by mouth in the morning, at noon, and at bedtime. (Patient not taking: Reported on 04/25/2021)  ? ?No facility-administered encounter medications on file as of 04/25/2021.  ? ? ?Allergies (verified) ?Toradol [ketorolac tromethamine] and Lamotrigine  ? ?History: ?Past Medical History:  ?Diagnosis Date  ?  Anxiety   ? Chronic leg pain   ? Depression   ? Essential hypertension, benign 10/20/2015  ? Schizoaffective disorder ([place])   ? ?Past Surgical History:  ?Procedure Laterality Date  ? Block procedure at pain center  03/27/06  ? Bone graft from hip to jaw  2004  ? Bone graft right side of head to jaw  2006  ? 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 RIGHT HEART CATH AND CORONARY ANGIOGRAPHY Right 08/02/2019  ? Procedure: RIGHT HEART CATH AND CORONARY ANGIOGRAPHY;  Surgeon: Wellington Hampshire, MD;  Location: [facility];  Service: Cardiovascular;  Laterality: Right;  ? Sphenopalatine ganglionic    ? ?Family History  ?Problem Relation Age of Onset  ? Hyperlipidemia Mother   ? Hypertension Mother   ? Depression Mother   ? Anxiety disorder Mother   ? Lung disease Father   ? Depression Brother   ? Anxiety disorder Brother   ? Bipolar disorder Maternal Grandfather   ? Schizophrenia Maternal Grandfather   ? Bipolar disorder Paternal Grandfather   ? ?Social History  ? ?Socioeconomic History  ? Marital status: Single  ?  Spouse name: Not on file  ? Number of children: 0  ? Years of education: 8  ? Highest education level: Not on file  ?Occupational History  ? Occupation: Fish farm manager  ?  Employer: [employer]  ?Tobacco Use  ? Smoking status: Every Day  ?  Packs/day: 1.50  ?  Years: 23.00  ?  Pack years: 34.50  ?  Types: Cigarettes  ?  Start date: 09/22/1995  ? Smokeless tobacco: Former  ? Tobacco comments:  ?  Pt states that he is cutting back - currently at 1/2 PPD  (as of 07/27/19)  ?Vaping Use  ? Vaping Use: Never used  ?Substance and Sexual Activity  ? Alcohol use: No  ?  Alcohol/week: 0.0 standard drinks  ? Drug use: No  ? Sexual activity: Not Currently  ?Other Topics Concern  ? Not on file  ?Social History Narrative  ?   ? Exercise:  None, running causes head pain  ? Diet:  FF once daily, + fruits and veggies, + H2O, + Soda  ? Lives with mom in a one story home (has 5 steps).  No children.  Currently not working - disabled.  Education: associate degree.   ? Right handed  ? One story home  ? ?Social Determinants of Health  ? ?Financial Resource Strain: Low Risk   ? Difficulty of Paying Living Expenses: Not hard at all  ?Food Insecurity: No Food Insecurity  ? Worried About Charity fundraiser in the Last Year: Never true  ? Ran Out of Food in the Last Year: Never true  ?Transportation Needs: No Transportation Needs  ? Lack of Transportation (Medical): No  ? Lack of Transportation (Non-Medical): No   ?Physical Activity: Sufficiently Active  ? Days of Exercise per Week: 6 days  ? Minutes of Exercise per Session: 60 min  ?Stress: No Stress Concern Present  ? Feeling of Stress : Only a little  ?Social Connections: Moderately Isolated  ? Frequency of Communication with Friends and Family: Twice a week  ? Frequency of Social Gatherings with Friends and Family: More than three times a week  ? Attends Religious Services: More than 4 times per year  ? Active Member of Clubs or Organizations: No  ? Attends Archivist Meetings: Never  ?  Marital Status: Never married  ? ? ?Tobacco Counseling ?Ready to quit: Not Answered ?Counseling given: Not Answered ?Tobacco comments: Pt states that he is cutting back - currently at 1/2 PPD  (as of 07/27/19) ? ? ?Clinical Intake: ? ?Pre-visit preparation completed: Yes ? ?Pain : No/denies pain ? ?  ? ?Nutritional Status: BMI > 30  Obese ?Nutritional Risks: None ?Diabetes: No ? ?How often do you need to have someone help you when you read instructions, pamphlets, or other written materials from your doctor or pharmacy?: 1 - Never ? ?Diabetic? No ? ?Interpreter Needed?: No ? ?Information entered by :: Orrin Brigham LPN ? ? ?Activities of Daily Living ? ?  04/25/2021  ?  3:16 PM  ?In your present state of health, do you have any difficulty performing the following activities:  ?Hearing? 0  ?Vision? 1  ?Difficulty concentrating or making decisions? 0  ?Walking or climbing stairs? 0  ?Dressing or bathing? 0  ?Doing errands, shopping? 0  ?Preparing Food and eating ? N  ?Using the Toilet? N  ?In the past six months, have you accidently leaked urine? N  ?Do you have problems with loss of bowel control? N  ?Managing your Medications? N  ?Managing your Finances? N  ?Housekeeping or managing your Housekeeping? N  ? ? ?Patient Care Team: ?Jinny Sanders, MD as PCP - General ?Wellington Hampshire, MD as PCP - Cardiology (Cardiology) ?Alda Berthold, DO as Consulting Physician  (Neurology) ? ?Indicate any recent Medical Services you may have received from other than [facility] providers in the past year (date may be approximate). ? ?   ?Assessment:  ? This is a routine wellness examination for [place]. ? ?Hear

## 2021-04-25 NOTE — Patient Instructions (Addendum)
Mr. Rabenold , ?Thank you for taking time to complete your Medicare Wellness Visit. I appreciate your ongoing commitment to your health goals. Please review the following plan we discussed and let me know if I can assist you in the future.  ? ?Screening recommendations/referrals: ?Colonoscopy: not yet eligible due to age ?ophthalmology/optometry visit : up to date ?Recommended yearly dental visit for hygiene and checkup ? ?Vaccinations: ?Influenza vaccine: Due-May obtain vaccine at our office or your local pharmacy. ?Pneumococcal vaccine: May obtain vaccine at our office or your local pharmacy. ?Tdap vaccine: due 06/09/19, medicare might cover in the event you are cut or injured  ?Shingles vaccine: not yet eligible due to age   ?Covid-19: newest booster available at your local pharmacy ? ?Advanced directives: information available at your next appointment ? ?Conditions/risks identified: see problem list  ? ?Next appointment: Follow up in one year for your annual wellness visit  ? ?Preventive Care 40-64 Years, Male ?Preventive care refers to lifestyle choices and visits with your health care provider that can promote health and wellness. ?What does preventive care include? ?A yearly physical exam. This is also called an annual well check. ?Dental exams once or twice a year. ?Routine eye exams. Ask your health care provider how often you should have your eyes checked. ?Personal lifestyle choices, including: ?Daily care of your teeth and gums. ?Regular physical activity. ?Eating a healthy diet. ?Avoiding tobacco and drug use. ?Limiting alcohol use. ?Practicing safe sex. ?Taking low-dose aspirin every day starting at age 19. ?What happens during an annual well check? ?The services and screenings done by your health care provider during your annual well check will depend on your age, overall health, lifestyle risk factors, and family history of disease. ?Counseling  ?Your health care provider may ask you questions about  your: ?Alcohol use. ?Tobacco use. ?Drug use. ?Emotional well-being. ?Home and relationship well-being. ?Sexual activity. ?Eating habits. ?Work and work Astronomer. ?Screening  ?You may have the following tests or measurements: ?Height, weight, and BMI. ?Blood pressure. ?Lipid and cholesterol levels. These may be checked every 5 years, or more frequently if you are over 46 years old. ?Skin check. ?Lung cancer screening. You may have this screening every year starting at age 79 if you have a 30-pack-year history of smoking and currently smoke or have quit within the past 15 years. ?Fecal occult blood test (FOBT) of the stool. You may have this test every year starting at age 28. ?Flexible sigmoidoscopy or colonoscopy. You may have a sigmoidoscopy every 5 years or a colonoscopy every 10 years starting at age 64. ?Prostate cancer screening. Recommendations will vary depending on your family history and other risks. ?Hepatitis C blood test. ?Hepatitis B blood test. ?Sexually transmitted disease (STD) testing. ?Diabetes screening. This is done by checking your blood sugar (glucose) after you have not eaten for a while (fasting). You may have this done every 1-3 years. ?Discuss your test results, treatment options, and if necessary, the need for more tests with your health care provider. ?Vaccines  ?Your health care provider may recommend certain vaccines, such as: ?Influenza vaccine. This is recommended every year. ?Tetanus, diphtheria, and acellular pertussis (Tdap, Td) vaccine. You may need a Td booster every 10 years. ?Zoster vaccine. You may need this after age 23. ?Pneumococcal 13-valent conjugate (PCV13) vaccine. You may need this if you have certain conditions and have not been vaccinated. ?Pneumococcal polysaccharide (PPSV23) vaccine. You may need one or two doses if you smoke cigarettes or if you have  certain conditions. ?Talk to your health care provider about which screenings and vaccines you need and how  often you need them. ?This information is not intended to replace advice given to you by your health care provider. Make sure you discuss any questions you have with your health care provider. ?Document Released: 02/03/2015 Document Revised: 09/27/2015 Document Reviewed: 11/08/2014 ?Elsevier Interactive Patient Education ? 2017 [employer] ? ?Fall Prevention in the Home ?Falls can cause injuries. They can happen to people of all ages. There are many things you can do to make your home safe and to help prevent falls. ?What can I do on the outside of my home? ?Regularly fix the edges of walkways and driveways and fix any cracks. ?Remove anything that might make you trip as you walk through a door, such as a raised step or threshold. ?Trim any bushes or trees on the path to your home. ?Use bright outdoor lighting. ?Clear any walking paths of anything that might make someone trip, such as rocks or tools. ?Regularly check to see if handrails are loose or broken. Make sure that both sides of any steps have handrails. ?Any raised decks and porches should have guardrails on the edges. ?Have any leaves, snow, or ice cleared regularly. ?Use sand or salt on walking paths during winter. ?Clean up any spills in your garage right away. This includes oil or grease spills. ?What can I do in the bathroom? ?Use night lights. ?Install grab bars by the toilet and in the tub and shower. Do not use towel bars as grab bars. ?Use non-skid mats or decals in the tub or shower. ?If you need to sit down in the shower, use a plastic, non-slip stool. ?Keep the floor dry. Clean up any water that spills on the floor as soon as it happens. ?Remove soap buildup in the tub or shower regularly. ?Attach bath mats securely with double-sided non-slip rug tape. ?Do not have throw rugs and other things on the floor that can make you trip. ?What can I do in the bedroom? ?Use night lights. ?Make sure that you have a light by your bed that is easy to  reach. ?Do not use any sheets or blankets that are too big for your bed. They should not hang down onto the floor. ?Have a firm chair that has side arms. You can use this for support while you get dressed. ?Do not have throw rugs and other things on the floor that can make you trip. ?What can I do in the kitchen? ?Clean up any spills right away. ?Avoid walking on wet floors. ?Keep items that you use a lot in easy-to-reach places. ?If you need to reach something above you, use a strong step stool that has a grab bar. ?Keep electrical cords out of the way. ?Do not use floor polish or wax that makes floors slippery. If you must use wax, use non-skid floor wax. ?Do not have throw rugs and other things on the floor that can make you trip. ?What can I do with my stairs? ?Do not leave any items on the stairs. ?Make sure that there are handrails on both sides of the stairs and use them. Fix handrails that are broken or loose. Make sure that handrails are as long as the stairways. ?Check any carpeting to make sure that it is firmly attached to the stairs. Fix any carpet that is loose or worn. ?Avoid having throw rugs at the top or bottom of the stairs. If you do  have throw rugs, attach them to the floor with carpet tape. ?Make sure that you have a light switch at the top of the stairs and the bottom of the stairs. If you do not have them, ask someone to add them for you. ?What else can I do to help prevent falls? ?Wear shoes that: ?Do not have high heels. ?Have rubber bottoms. ?Are comfortable and fit you well. ?Are closed at the toe. Do not wear sandals. ?If you use a stepladder: ?Make sure that it is fully opened. Do not climb a closed stepladder. ?Make sure that both sides of the stepladder are locked into place. ?Ask someone to hold it for you, if possible. ?Clearly mark and make sure that you can see: ?Any grab bars or handrails. ?First and last steps. ?Where the edge of each step is. ?Use tools that help you move  around (mobility aids) if they are needed. These include: ?Canes. ?Walkers. ?Scooters. ?Crutches. ?Turn on the lights when you go into a dark area. Replace any light bulbs as soon as they burn out. ?Set up your furnit

## 2021-05-06 ENCOUNTER — Other Ambulatory Visit: Payer: Self-pay | Admitting: Family Medicine

## 2021-05-06 NOTE — Telephone Encounter (Signed)
Last office visit 03/24/20 for lymphedema/cellulitis of both lower extremities.  Last refilled 02/02/21 for #270 with no refills.  No future appointments.  ?

## 2021-05-08 NOTE — Telephone Encounter (Signed)
Pt called and says that he did not request this refill, but was notified that it was denied.  ?Pt states that he still has some to get him by.  ?I told pt that he would need an appt for future refills and he says that as soon as he gets his compression socks then he will make his annual appt.  ?

## 2021-06-29 ENCOUNTER — Telehealth: Payer: Self-pay

## 2021-06-29 MED ORDER — TIZANIDINE HCL 2 MG PO TABS: 2.0000 mg | ORAL_TABLET | Freq: Three times a day (TID) | ORAL | 0 refills | Status: DC | PRN

## 2021-06-29 NOTE — Telephone Encounter (Signed)
MEDICATION: tiZANidine (ZANAFLEX) 2 MG tablet  PHARMACY: [id number] - [place], [place] - [address]  Comments: Patient is completely out. Asking for a refill to last until Tuesday.   **Let patient know to contact pharmacy at the end of the day to make sure medication is ready. **  ** Please notify patient to allow 48-72 hours to process**  **Encourage patient to contact the pharmacy for refills or they can request refills through [facility]**

## 2021-06-29 NOTE — Telephone Encounter (Signed)
Last office visit 03/24/20 for leg swelling/foot swelling.  Last refilled 02/02/21 for #270 with no refills.  Next Appt: 07/03/21 to follow up on medications.

## 2021-07-03 ENCOUNTER — Encounter: Payer: Self-pay | Admitting: Family Medicine

## 2021-07-03 ENCOUNTER — Ambulatory Visit (INDEPENDENT_AMBULATORY_CARE_PROVIDER_SITE_OTHER): Payer: Medicare Other | Admitting: Family Medicine

## 2021-07-03 VITALS — BP 124/86 | HR 105 | Temp 97.9°F | Ht 76.5 in | Wt 236.4 lb

## 2021-07-03 DIAGNOSIS — E559 Vitamin D deficiency, unspecified: Secondary | ICD-10-CM | POA: Diagnosis not present

## 2021-07-03 DIAGNOSIS — F251 Schizoaffective disorder, depressive type: Secondary | ICD-10-CM

## 2021-07-03 DIAGNOSIS — F17200 Nicotine dependence, unspecified, uncomplicated: Secondary | ICD-10-CM

## 2021-07-03 DIAGNOSIS — I890 Lymphedema, not elsewhere classified: Secondary | ICD-10-CM

## 2021-07-03 DIAGNOSIS — R7309 Other abnormal glucose: Secondary | ICD-10-CM

## 2021-07-03 DIAGNOSIS — E7800 Pure hypercholesterolemia, unspecified: Secondary | ICD-10-CM | POA: Diagnosis not present

## 2021-07-03 DIAGNOSIS — G6289 Other specified polyneuropathies: Secondary | ICD-10-CM | POA: Diagnosis not present

## 2021-07-03 DIAGNOSIS — L723 Sebaceous cyst: Secondary | ICD-10-CM

## 2021-07-03 DIAGNOSIS — I10 Essential (primary) hypertension: Secondary | ICD-10-CM | POA: Diagnosis not present

## 2021-07-03 DIAGNOSIS — F39 Unspecified mood [affective] disorder: Secondary | ICD-10-CM

## 2021-07-03 NOTE — Assessment & Plan Note (Signed)
He states he is contemplative for smoking cessation has already decreased significantly.  Discussed the health benefits associated with quitting smoking.

## 2021-07-03 NOTE — Assessment & Plan Note (Signed)
Chronic, well controlled.  Followed by psychiatry 

## 2021-07-03 NOTE — Assessment & Plan Note (Signed)
Chronic, well controlled on Neurontin.

## 2021-07-03 NOTE — Assessment & Plan Note (Signed)
Chronic, well controlled.  Followed by psychiatry

## 2021-07-03 NOTE — Progress Notes (Signed)
Patient ID: Jeffrey Lin, male    DOB: [birth year], 46 y.o.   MRN: [id number]  This visit was conducted in person.  BP 124/86   Pulse (!) 105   Temp 97.9 F (36.6 C) (Oral)   Ht 6' 4.5" (1.943 m)   Wt 236 lb 6 oz (107.2 kg)   SpO2 95%   BMI 28.40 kg/m    CC:  Chief Complaint  Patient presents with   Follow-up    Lymphedema of Bilateral Legs    Subjective:   HPI: Jeffrey Lin is a 46 y.o. male presenting on 07/03/2021 for Follow-up (Lymphedema of Bilateral Legs)   He is overdue for his yearly wellness exam.  AMW 04/25/2021 He is over due for fasting lab evaluation of cholesterol liver function and kidney function.  Of note he has lost significant weight since the last in person office visit.  Approximately 45 pounds  He has been more active lately doing yard work. He is feeling stronger overall.  Cutting back on mountain dew.  History of bilateral lymphedema.  In the past seen by the wound care center.  In the past has used lymphedema pumps twice a day and compression hose. He is  NO LONGER REQUIRING torsemide 20 mg 2 tablets daily for peripheral swelling. He has had significant improvement in lymphedema with wraps intially.. has custom made compression sock. Hypertension is well controlled  even off the  medication.  NO LONGER TAKING  losartan 100 mg half tablet p.o. daily, Coreg 37.5 mg p.o. twice daily  At home seeing 125-130/80-90 BP Readings from Last 3 Encounters:  07/03/21 124/86  02/22/21 128/88  03/24/20 (!) 152/100    Area of swelling on right temple.. some white discharge, no associated erythema. Cyst there in past S/P removal.   Wt Readings from Last 3 Encounters:  07/03/21 236 lb 6 oz (107.2 kg)  04/25/21 280 lb (127 kg)  03/24/20 280 lb 8 oz (127.2 kg)      Not taking atorvastatin.Marland Kitchen due for re-eval.       Relevant past medical, surgical, family and social history reviewed and updated as indicated. Interim medical history since our last visit  reviewed. Allergies and medications reviewed and updated. Outpatient Medications Prior to Visit  Medication Sig Dispense Refill   ALPRAZolam (XANAX) 1 MG tablet Take 1 mg by mouth 4 (four) times daily as needed.     amphetamine-dextroamphetamine (ADDERALL) 20 MG tablet Take 20 mg by mouth in the morning, at noon, and at bedtime.     atorvastatin (LIPITOR) 40 MG tablet TAKE 1 TABLET BY MOUTH EVERY DAY 90 tablet 3   carvedilol (COREG) 25 MG tablet Take 37.5 mg by mouth 2 (two) times daily with a meal.     gabapentin (NEURONTIN) 600 MG tablet Take 1,200 mg by mouth 3 (three) times daily.      losartan (COZAAR) 100 MG tablet TAKE 1/2 TABLET BY MOUTH DAILY 45 tablet 0   potassium chloride (KLOR-CON) 10 MEQ tablet TAKE 1 TABLET BY MOUTH EVERY DAY 90 tablet 1   tamsulosin (FLOMAX) 0.4 MG CAPS capsule Take 1 capsule (0.4 mg total) by mouth daily. 90 capsule 3   tiZANidine (ZANAFLEX) 2 MG tablet Take 1 tablet (2 mg total) by mouth 3 (three) times daily as needed for muscle spasms. 270 tablet 0   torsemide (DEMADEX) 20 MG tablet Take 2 tablets (40 mg total) by mouth daily. 60 tablet 2   Vitamin D, Ergocalciferol, (DRISDOL) 50000  units CAPS capsule Take 1 capsule (50,000 Units total) by mouth 2 (two) times a week.     cephALEXin (KEFLEX) 500 MG capsule Take 1 capsule (500 mg total) by mouth 3 (three) times daily. 21 capsule 0   diazepam (VALIUM) 10 MG tablet Take 10 mg by mouth 4 (four) times daily.     methylphenidate (RITALIN) 20 MG tablet Take 20 mg by mouth 3 (three) times daily.     No facility-administered medications prior to visit.     Per HPI unless specifically indicated in ROS section below Review of Systems  Constitutional:  Negative for fatigue and fever.  HENT:  Negative for ear pain.   Eyes:  Negative for pain.  Respiratory:  Negative for cough and shortness of breath.   Cardiovascular:  Negative for chest pain, palpitations and leg swelling.  Gastrointestinal:  Negative for  abdominal pain.  Genitourinary:  Negative for dysuria.  Musculoskeletal:  Negative for arthralgias.  Neurological:  Negative for syncope, light-headedness and headaches.  Psychiatric/Behavioral:  Negative for dysphoric mood.    Objective:  BP 124/86   Pulse (!) 105   Temp 97.9 F (36.6 C) (Oral)   Ht 6' 4.5" (1.943 m)   Wt 236 lb 6 oz (107.2 kg)   SpO2 95%   BMI 28.40 kg/m   Wt Readings from Last 3 Encounters:  07/03/21 236 lb 6 oz (107.2 kg)  04/25/21 280 lb (127 kg)  03/24/20 280 lb 8 oz (127.2 kg)      Physical Exam Constitutional:      Appearance: He is well-developed.  HENT:     Head: Normocephalic.     Right Ear: Hearing normal.     Left Ear: Hearing normal.     Nose: Nose normal.  Neck:     Thyroid: No thyroid mass or thyromegaly.     Vascular: No carotid bruit.     Trachea: Trachea normal.  Cardiovascular:     Rate and Rhythm: Normal rate and regular rhythm.     Pulses: Normal pulses.     Heart sounds: Heart sounds not distant. No murmur heard.    No friction rub. No gallop.     Comments: minimal peripheral edema, chronic venous stasis changes present Pulmonary:     Effort: Pulmonary effort is normal. No respiratory distress.     Breath sounds: Normal breath sounds.  Skin:    General: Skin is warm and dry.     Findings: No rash.     Comments: Sebaceous cyst right temple, no associated erythema  Psychiatric:        Speech: Speech normal.        Behavior: Behavior normal.        Thought Content: Thought content normal.       Results for orders placed or performed during the hospital encounter of 09/20/19  Aerobic Culture  (superficial specimen)   Specimen: Wound  Result Value Ref Range   Specimen Description      WOUND Performed at [facility], [address] [place], [place] [address]    Special Requests      RIGHT LOWER EXTREMETY Performed at [facility], [address] [place], [place] [address]    Gram Stain       RARE WBC PRESENT,BOTH PMN AND MONONUCLEAR MODERATE GRAM POSITIVE COCCI FEW GRAM NEGATIVE RODS Performed at [facility], [address], [place], [place] [address]    Culture      MODERATE ENTEROCOCCUS  FAECALIS FEW PSEUDOMONAS STUTZERI    Report Status 09/23/2019 FINAL    Organism ID, Bacteria ENTEROCOCCUS FAECALIS    Organism ID, Bacteria PSEUDOMONAS STUTZERI       Susceptibility   Enterococcus faecalis - MIC*    AMPICILLIN <=2 SENSITIVE Sensitive     VANCOMYCIN 1 SENSITIVE Sensitive     GENTAMICIN SYNERGY SENSITIVE Sensitive     * MODERATE ENTEROCOCCUS FAECALIS   Pseudomonas stutzeri - MIC*    CEFTAZIDIME 4 SENSITIVE Sensitive     CIPROFLOXACIN <=0.25 SENSITIVE Sensitive     GENTAMICIN <=1 SENSITIVE Sensitive     IMIPENEM 2 SENSITIVE Sensitive     PIP/TAZO 8 SENSITIVE Sensitive     * FEW PSEUDOMONAS STUTZERI     COVID 19 screen:  No recent travel or known exposure to COVID19 The patient denies respiratory symptoms of COVID 19 at this time. The importance of social distancing was discussed today.   Assessment and Plan Problem List Items Addressed This Visit     Essential hypertension, benign    Chronic, borderline control off his losartan and Coreg.  He will continue to follow at home, we will reevaluate at the next office visit and consider restarting a lower dose of losartan.      HYPERCHOLESTEROLEMIA    Chronic, unclear control off of atorvastatin.  We will reevaluate with upcoming fasting labs      Relevant Orders   Lipid panel   Comprehensive metabolic panel   Lymphedema of both lower extremities - Primary    Chronic, interval significant improvement  He has had significant improvement with treatments from lymphedema clinic including wraps, compression hose fitted for him and lymphedema pumps.  He is no longer requiring torsemide.  He has been able to become much more active and has lost 45 pounds.      Peripheral neuropathy    Chronic, well controlled  on Neurontin.      Relevant Medications   ALPRAZolam (XANAX) 1 MG tablet   PREDIABETES    Due for reevaluation.  Likely improved on lower carbohydrate less soda diet.      Relevant Orders   Hemoglobin A1c   Psychotic mood disorder (HCC)    Chronic, well controlled.  Followed by psychiatry      Schizoaffective disorder, depressive type (HCC)    Chronic, well controlled.  Followed by psychiatry      Relevant Medications   ALPRAZolam (XANAX) 1 MG tablet   Sebaceous cyst    Acute worsening of chronic cyst.  He is bothered by the cyst given the location.  There is no current associated infection or inflammation.  He will try warm compresses but if there is no improvement he will let me know if we need to move forward with dermatology referral for removal.      TOBACCO ABUSE    He states he is contemplative for smoking cessation has already decreased significantly.  Discussed the health benefits associated with quitting smoking.      Vitamin D deficiency   Relevant Orders   VITAMIN D 25 Hydroxy (Vit-D Deficiency, Fractures)       Kerby NoraAmy Meliah Appleman, MD

## 2021-07-03 NOTE — Assessment & Plan Note (Signed)
Chronic, borderline control off his losartan and Coreg.  He will continue to follow at home, we will reevaluate at the next office visit and consider restarting a lower dose of losartan.

## 2021-07-03 NOTE — Assessment & Plan Note (Signed)
Chronic, unclear control off of atorvastatin.  We will reevaluate with upcoming fasting labs

## 2021-07-03 NOTE — Assessment & Plan Note (Signed)
Acute worsening of chronic cyst.  He is bothered by the cyst given the location.  There is no current associated infection or inflammation.  He will try warm compresses but if there is no improvement he will let me know if we need to move forward with dermatology referral for removal.

## 2021-07-03 NOTE — Patient Instructions (Addendum)
Quit smoking.  Keep up great work on  increasing activity and healthy eating, less soda.  Follow blood pressure at  home off the medication.Marland Kitchen goal < 140/90.  Can try warm compresses to sebaceous cyst on right temple.

## 2021-07-03 NOTE — Assessment & Plan Note (Signed)
Chronic, interval significant improvement  He has had significant improvement with treatments from lymphedema clinic including wraps, compression hose fitted for him and lymphedema pumps.  He is no longer requiring torsemide.  He has been able to become much more active and has lost 45 pounds.

## 2021-07-03 NOTE — Assessment & Plan Note (Signed)
Due for reevaluation.  Likely improved on lower carbohydrate less soda diet.

## 2021-07-05 ENCOUNTER — Ambulatory Visit: Payer: Medicare Other | Attending: Physician Assistant | Admitting: Occupational Therapy

## 2021-07-05 DIAGNOSIS — I890 Lymphedema, not elsewhere classified: Secondary | ICD-10-CM | POA: Diagnosis not present

## 2021-07-05 NOTE — Therapy (Signed)
[place] [facility] [address] [place], [place], [address] Phone: [phone]   Fax:  [phone]  Occupational Therapy Treatment  Patient Details  Name: Jeffrey Lin MRN: [id number] Date of Birth: [birth year] Referring Provider (OT): Jeri Cos, [place]   Encounter Date: 07/05/2021   OT End of Session - 07/05/21 1125     Visit Number 28    Number of Visits 36    Date for OT Re-Evaluation 10/03/21    OT Start Time 0800    OT Stop Time 0830    OT Time Calculation (min) 30 min    Activity Tolerance Patient tolerated treatment well;No increased pain    Behavior During Therapy [facility] for tasks assessed/performed             Past Medical History:  Diagnosis Date   Anxiety    Chronic leg pain    Depression    Essential hypertension, benign 10/20/2015   Schizoaffective disorder [facility])     Past Surgical History:  Procedure Laterality Date   Block procedure at pain center  03/27/06   Bone graft from hip to jaw  2004   Bone graft right side of head to jaw  2006   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   Subjective Assessment - 07/05/21 1127     Subjective  Jeffrey Lin presents for OT follow-along during self-management phase of CDT for BLE lymphedema. Pt presents with existing custom compression knee highs in place. He denies leg pain. He reports he has lost 40 pounds over the past months due to much increased activity level since improving the condition of his legs. Pt is here for fitting of remade garments initially fit in March, 2023.    Patient is accompanied by: Family member    Pertinent History H&P relevant to BLE  lymphedema:   tobacco abuse, HTN, hx BLE ulcers, hx cellulitis, Hx DVT, Hx patellar fx, chronic BLE pain, chronic BLE lymphedema, neuropathy, Hx pneumonia, snoring. chronic diastolic heart failure, sinus tachycardia, chronic back pain, psychotic mood disorder/ Schitzoaffective disorder- depressive type, snoring    Limitations chronic leg pain and swelling, decreased balance,    Repetition Increases Symptoms    Special Tests Intake FOTO: 45/100    Patient Stated Goals get pain and swelling down in my legs    Currently in Pain? No/denies    Pain Onset More than a month ago   approximately 4 years ago   Pain Onset More than a month ago                          OT Treatments/Exercises (OP) - 07/05/21 1132       ADLs   ADL Education Given Yes      Manual Therapy   Manual Therapy Edema management    Edema Management 2nd fitting- remakes for replacement garments.                    OT Education - 07/05/21 1134     Education Details Continued skilled Pt/caregiver education  And LE ADL training throughout visit for lymphedema self care/  home program, including compression wrapping, compression garment and device wear/care, lymphatic pumping ther ex, simple self-MLD, and skin care. Discussed initial volumetric measurements. Discussed all long term goals.    Person(s) Educated Patient;Parent(s)    Methods Explanation;Demonstration;Handout    Comprehension Verbalized understanding;Returned demonstration                 OT Long Term Goals - 11/10/20 1059       OT LONG TERM GOAL #1   Title Patient's intake functional score on the FOTO tool is 45 out of 100 (higher number = greater function). Given this patient's comorbidities she will experience at least an increase in function of 4 points, or higher, to increase level of independence with basic ADLs and functional ambulation and mobility.    Baseline 45/100    Time 12    Period Weeks    Status Achieved    final score up 29 points from 45/100 to 74/100. Goal met     OT LONG TERM GOAL #2   Title Pt will be able to apply multi-layer, short stretch compression wraps to one leg at a time using correct gradient techniques with maximum caregiver assistance in an effort  to return the affected  limb(s)  to premorbid size and shape, to limit pain and infection risk, and to improve functional mobility and ambulation  for ADLs.    Baseline dependent    Time 4    Period Days    Status Achieved   Goal met and exceeded. Pt independent with applying wraps and laundering them.     OT LONG TERM GOAL #3   Title Pt will achieve at least 10% limb volume reduction Bilaterally during Intensive Phase CDT to prevent re-accumulation of lymphatic congestion and progression of fibrosis, to limit infection risk, to improve functional ambulation and transfers, and to promote wound healing.    Baseline dependent    Time 12    Period Weeks    Status Partially Met   Met for LLE w/ 22.14% reduction measured 09/20/20. To date RLE is treduced ~ 5% from ankle to tibial tuberosity.   Target Date 11/22/20      OT LONG TERM GOAL #4   Title With max caregiver assistance Pt will achieve and sustain a least 85% compliance with all LE self-care home program components throughout Intensive Phase CDT, including elevation when seated, daily skin inspection and care, lymphatic pumping ther ex, 23/7 compression wraps and simple self-MLD. Max caregiver assistance is necessary to ensure optimal limb volume reduction, to limit infection risk and to limit LE progression. Pt will arrange for assistance before we begin CDT.    Baseline dependent    Time 12    Period Weeks    Status Achieved   Met and exceeded. Pt remains 100% compliant with all LE self-care home program components     OT LONG TERM GOAL #5   Title Using assistive devices (modified independence) Pt will be able to don and doff appropriate daytime compression garments to limit  lymphatic re-accumulation and LE progression before transitioning to self-management phase of CDT.Using assistive devices (modified independence) Pt will be able to don and doff appropriate daytime compression garments to limit lymphatic re-accumulation and LE progression before transitioning to self-management phase of CDT.    Baseline Max A    Time 12    Period Weeks    Status Achieved   LLE custom garment and HOS device ordered. Awaiting fitting.  Plan - 07/05/21 1126     Clinical Impression Statement Garment remakes fit perfectly. Length is corrected. Pt reports comfort in garments, and he is able to don and doff them with modified independence using a non friction slipper. Pt agrees with plan to return for 3 month follow-along for optimal management of chronic , progressive , BLE lymphedema,  and he will call PRN . Vendor paperwork faxed after session.    OT Occupational Profile and History Comprehensive Assessment- Review of records and extensive additional review of physical, cognitive, psychosocial history related to current functional performance    Occupational performance deficits (Please refer to evaluation for details): IADL's;ADL's;Rest and Sleep;Work;Leisure;Social Participation   w max CG assist for home program   Body Structure / Function / Physical Skills ADL;Edema;Skin integrity;Pain;Decreased knowledge of precautions;Decreased knowledge of use of DME;IADL    Rehab Potential Good    Clinical Decision Making Several treatment options, min-mod task modification necessary    Comorbidities Affecting Occupational Performance: Presence of comorbidities impacting occupational performance    Comorbidities impacting occupational performance description: suspected CVI, hx cellulitis, hx dvt    Modification or Assistance to Complete Evaluation  Min-Moderate modification of tasks or assist with assess necessary to complete eval    OT Frequency Other (comment)    follow along PRN   OT Duration Other (comment)   PRN   OT Treatment/Interventions Self-care/ADL training;Therapeutic exercise;Manual Therapy;Coping strategies training;Therapeutic activities;Manual lymph drainage;DME and/or AE instruction;Compression bandaging;Other (comment);Patient/family education   skin care with low ph castor oil and/ or eucerin lotion   Plan Intensive and Self-Management Phases of Complete Decongestive Therapy (CDT) to include manual lymphatic drainage, skin care, compression wraps and garments, therapeutic exercise, caregiver training    Recommended Other Services fit with BLE, knee length, custom compression garments and HOS devices needed to limit worsening fibrosis formation during HOS. Consider fitting with advanced, sequential , pneumatic compression device, or Flexitouch "pump" medically necessary for optimal level of independence with LE self-management at home over time    Consulted and Agree with Plan of Care Patient;Family member/caregiver             Patient will benefit from skilled therapeutic intervention in order to improve the following deficits and impairments:   Body Structure / Function / Physical Skills: ADL, Edema, Skin integrity, Pain, Decreased knowledge of precautions, Decreased knowledge of use of DME, IADL       Visit Diagnosis: Lymphedema, not elsewhere classified    Problem List Patient Active Problem List   Diagnosis Date Noted   Sebaceous cyst 07/03/2021   Lymphedema of both lower extremities 09/09/2019   Benign prostatic hyperplasia with lower urinary tract symptoms 07/02/2019   Thiamine deficiency 12/28/2018   Peripheral neuropathy 05/07/2017   Hoarseness of voice 11/23/2015   Essential hypertension, benign 10/20/2015   Schizoaffective disorder, depressive type ([place]) 02/17/2015   Vitamin D deficiency 02/14/2015   Psychotic mood disorder ([place]) 05/14/2012   History of DVT (deep vein thrombosis) 07/06/2010   History of  patellar fracture 07/06/2010   HYPERCHOLESTEROLEMIA 10/13/2009   PREDIABETES 10/13/2009   Anxiety state 06/02/2006   TOBACCO ABUSE 06/02/2006   Chronic back pain 06/02/2006    Andrey Spearman, MS, OTR/L, [facility] 07/05/21 11:37 AM   [place] [facility] SERVICES [address] [place], [place], [address] Phone: [phone]   Fax:  [phone]  Name: Jeffrey Lin MRN: [id number] Date of Birth: [birth year]

## 2021-07-05 NOTE — Patient Instructions (Signed)

## 2021-09-26 ENCOUNTER — Other Ambulatory Visit: Payer: Self-pay | Admitting: Family Medicine

## 2021-09-26 NOTE — Telephone Encounter (Signed)
Last office visit 07/03/21 for Lymphedema of Bilateral Legs.  Last refilled 06/29/21 for #270 with no refills.  CPE scheduled 11/09/2021.

## 2021-10-04 ENCOUNTER — Ambulatory Visit: Payer: Medicare Other | Attending: Physician Assistant | Admitting: Occupational Therapy

## 2021-10-04 DIAGNOSIS — I890 Lymphedema, not elsewhere classified: Secondary | ICD-10-CM | POA: Insufficient documentation

## 2021-10-04 NOTE — Patient Instructions (Signed)

## 2021-10-05 NOTE — Therapy (Signed)
[place] MAIN [facility] SERVICES [address] [place], [place], [address] Phone: [phone]   Fax:  [phone]  Occupational Therapy Treatment  Patient Details  Name: Jeffrey Lin MRN: [id number] Date of Birth: [birth year] No data recorded  Encounter Date: 10/04/2021   OT End of Session - 10/04/21 0809     Visit Number 29    Number of Visits 36    Date for OT Re-Evaluation 01/02/22    OT Start Time 0800    OT Stop Time 0905    OT Time Calculation (min) 65 min    Activity Tolerance Patient tolerated treatment well;No increased pain    Behavior During Therapy [facility] for tasks assessed/performed             Past Medical History:  Diagnosis Date   Anxiety    Chronic leg pain    Depression    Essential hypertension, benign 10/20/2015   Schizoaffective disorder [facility])     Past Surgical History:  Procedure Laterality Date   Block procedure at pain center  03/27/06   Bone graft from hip to jaw  2004   Bone graft right side of head to jaw  2006   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   Subjective Assessment - 10/04/21 1340     Subjective  Jeffrey Lin returns for OT follow-along during self-management phase of CDT for BLE lymphedema. He was last seen for garment replacement/ fitting on 07/05/21. Pt presents with existing custom compression knee highs in place. He denies leg pain. He reports he has lost over 50 pounds over the past several months from working outside on his property. [name] denies leg pain this morning. He states he is 100% compliant with daily conmpression garments and skin care as directed. Pt has no new  complaints.    Patient is accompanied by: Family member    Pertinent History H&P relevant to BLE lymphedema:   tobacco abuse, HTN, hx BLE ulcers, hx cellulitis, Hx DVT, Hx patellar fx, chronic BLE pain, chronic BLE lymphedema, neuropathy, Hx pneumonia, snoring. chronic diastolic heart failure, sinus tachycardia, chronic back pain, psychotic mood disorder/ Schitzoaffective disorder- depressive type, snoring    Limitations chronic leg pain and swelling, decreased balance,    Repetition Increases Symptoms    Special Tests Intake FOTO: 45/100    Patient Stated Goals get pain and swelling down in my legs    Currently in Pain? No/denies    Pain Onset More than a month ago   approximately 4 years ago   Pain Onset More than a month ago                 LYMPHEDEMA/ONCOLOGY QUESTIONNAIRE - 10/04/21 1345       Lymphedema Assessments   Lymphedema Assessments Lower extremities      Right Lower Extremity Lymphedema   Other RLE limb volume from ankle to tibial tuberosity is DECcreased by 18%  since last measured on 10/12/022. R leg volume is decreased by 36.45% overall since commencing OT for CDT on 08/30/20    Other R leg (Rx)  limb volume ankle to popliteal fossa (A-D) measures  3324.35m.      Left Lower Extremity Lymphedema   Other L leg (Rx)  limb volume ankle to popliteal fossa (A-D) measures 3546.2 ml.    Other LLE limb volume from ankle to tibial tuberosity is DECreased by 18% since last measured on 11/01/20, and DECreased by 23.3% overall since commencing OT for CDT on 08/30/20.                     OT Treatments/Exercises (OP) - 10/04/21 1343       ADLs   ADL Education Given Yes      Manual Therapy   Manual Therapy Edema management    Edema Management BLE comparative limb volumetrics; assessment of skin condition and lymphedema progression                    OT Education - 10/04/21 1354     Education Details Continued skilled Pt/caregiver education  And  LE ADL training throughout visit for lymphedema self care/ home program, including compression wrapping, compression garment and device wear/care, lymphatic pumping ther ex, simple self-MLD, and skin care. Discussed initial volumetric measurements. Discussed all long term goals.    Person(s) Educated Patient;Parent(s)    Methods Explanation;Demonstration;Handout    Comprehension Verbalized understanding;Returned demonstration                 OT Long Term Goals - 10/04/21 1400       OT LONG TERM GOAL #1   Title Patient's intake functional score on the FOTO tool is 45 out of 100 (higher number = greater function). Given this patient's comorbidities she will experience at least an increase in function of 4 points, or higher, to increase level of independence with basic ADLs and functional ambulation and mobility.    Baseline 45/100    Time 12    Period Weeks    Status Achieved   final score up 29 points from 45/100 to 74/100. Goal met     OT LONG TERM GOAL #2   Title Pt will be able to apply multi-layer, short stretch compression wraps to one leg at a time using correct gradient techniques with maximum caregiver assistance in an effort  to return the affected  limb(s)  to premorbid size and shape, to limit pain and infection risk, and to improve functional mobility and ambulation  for ADLs.    Baseline dependent    Time 4    Period Days    Status Achieved   Goal met and exceeded. Pt independent with applying wraps and laundering them.     OT LONG TERM GOAL #3   Title Pt will achieve at least 10% limb volume reduction Bilaterally during Intensive Phase CDT to prevent re-accumulation of lymphatic congestion and progression of fibrosis, to limit infection risk, to improve functional ambulation and transfers, and to promote wound healing.    Baseline dependent    Time 12    Period Weeks    Status Achieved   met bilaterally   Target Date --      OT LONG TERM GOAL #4   Title With max  caregiver assistance Pt will achieve and sustain a least 85% compliance with all LE self-care home program components throughout Intensive Phase CDT, including elevation when seated, daily skin inspection and care, lymphatic pumping ther ex, 23/7 compression wraps and simple self-MLD. Max caregiver assistance is necessary to ensure optimal limb volume reduction, to limit infection risk and to limit LE progression. Pt  will arrange for assistance before we begin CDT.    Baseline dependent    Time 12    Period Weeks    Status Achieved   Met and exceeded. Pt remains 100% compliant with all LE self-care home program components     OT LONG TERM GOAL #5   Title Using assistive devices (modified independence) Pt will be able to don and doff appropriate daytime compression garments to limit lymphatic re-accumulation and LE progression before transitioning to self-management phase of CDT.Using assistive devices (modified independence) Pt will be able to don and doff appropriate daytime compression garments to limit lymphatic re-accumulation and LE progression before transitioning to self-management phase of CDT.    Baseline Max A    Time 12    Period Weeks    Status Achieved   LLE custom garment and HOS device ordered. Awaiting fitting.                  Plan - 10/04/21 1354     Clinical Impression Statement BLE comparative limb volumetrics reveal RLE limb volume from ankle to tibial tuberosity is DECcreased by 18%  since last measured on 10/12/022., and DECreased overall by 36.45% since commencing OT for CDT on 08/30/20. LLE limb volume from ankle to tibial tuberosity is DECreased by 18% since last measured on 11/01/20, and DECreased by 23.3% overall since commencing OT for CDT on 08/30/20. Pt has been successful at sustaining lymphedema self-care home program over time with continued improvemebnt in his condition. Recent weight loss and increased activity level are also beneficial for managing  lymphedema. Pt's garments are in very good condition and are not yet ready for replacement. Pt does not require skilled therapy for lymphedema management at present. He'll return in 6 months for follow alobng support, and will call PRN if issues arise before that time. No further Rx indicated this date.    OT Occupational Profile and History Comprehensive Assessment- Review of records and extensive additional review of physical, cognitive, psychosocial history related to current functional performance    Occupational performance deficits (Please refer to evaluation for details): IADL's;ADL's;Rest and Sleep;Work;Leisure;Social Participation   w max CG assist for home program   Body Structure / Function / Physical Skills ADL;Edema;Skin integrity;Pain;Decreased knowledge of precautions;Decreased knowledge of use of DME;IADL    Rehab Potential Good    Clinical Decision Making Several treatment options, min-mod task modification necessary    Comorbidities Affecting Occupational Performance: Presence of comorbidities impacting occupational performance    Comorbidities impacting occupational performance description: suspected CVI, hx cellulitis, hx dvt    Modification or Assistance to Complete Evaluation  Min-Moderate modification of tasks or assist with assess necessary to complete eval    OT Frequency Other (comment)   follow along PRN   OT Duration Other (comment)   PRN   OT Treatment/Interventions Self-care/ADL training;Therapeutic exercise;Manual Therapy;Coping strategies training;Therapeutic activities;Manual lymph drainage;DME and/or AE instruction;Compression bandaging;Other (comment);Patient/family education   skin care with low ph castor oil and/ or eucerin lotion   Plan Intensive and Self-Management Phases of Complete Decongestive Therapy (CDT) to include manual lymphatic drainage, skin care, compression wraps and garments, therapeutic exercise, caregiver training    Recommended Other Services fit  with BLE, knee length, custom compression garments and HOS devices needed to limit worsening fibrosis formation during HOS. Consider fitting with advanced, sequential , pneumatic compression device, or Flexitouch "pump" medically necessary for optimal level of independence with LE self-management at home over time    Consulted and Agree  with Plan of Care Patient;Family member/caregiver             Patient will benefit from skilled therapeutic intervention in order to improve the following deficits and impairments:   Body Structure / Function / Physical Skills: ADL, Edema, Skin integrity, Pain, Decreased knowledge of precautions, Decreased knowledge of use of DME, IADL       Visit Diagnosis: Lymphedema, not elsewhere classified    Problem List Patient Active Problem List   Diagnosis Date Noted   Sebaceous cyst 07/03/2021   Lymphedema of both lower extremities 09/09/2019   Benign prostatic hyperplasia with lower urinary tract symptoms 07/02/2019   Thiamine deficiency 12/28/2018   Peripheral neuropathy 05/07/2017   Hoarseness of voice 11/23/2015   Essential hypertension, benign 10/20/2015   Schizoaffective disorder, depressive type ([place]) 02/17/2015   Vitamin D deficiency 02/14/2015   Psychotic mood disorder ([place]) 05/14/2012   History of DVT (deep vein thrombosis) 07/06/2010   History of patellar fracture 07/06/2010   HYPERCHOLESTEROLEMIA 10/13/2009   PREDIABETES 10/13/2009   Anxiety state 06/02/2006   TOBACCO ABUSE 06/02/2006   Chronic back pain 06/02/2006    Andrey Spearman, MS, OTR/L, [facility] 10/05/21 9:10 AM   [place] MAIN [facility] SERVICES [address] [place], [place], [address] Phone: [phone]   Fax:  [phone]  Name: SALVATORE SHEAR MRN: [id number] Date of Birth: 09-11-1975

## 2021-11-02 ENCOUNTER — Other Ambulatory Visit (INDEPENDENT_AMBULATORY_CARE_PROVIDER_SITE_OTHER): Payer: Medicare Other

## 2021-11-02 ENCOUNTER — Telehealth: Payer: Self-pay | Admitting: Family Medicine

## 2021-11-02 DIAGNOSIS — R7309 Other abnormal glucose: Secondary | ICD-10-CM | POA: Diagnosis not present

## 2021-11-02 DIAGNOSIS — E559 Vitamin D deficiency, unspecified: Secondary | ICD-10-CM

## 2021-11-02 DIAGNOSIS — E7800 Pure hypercholesterolemia, unspecified: Secondary | ICD-10-CM | POA: Diagnosis not present

## 2021-11-02 LAB — COMPREHENSIVE METABOLIC PANEL
ALT: 24 U/L (ref 0–53)
AST: 18 U/L (ref 0–37)
Albumin: 4.4 g/dL (ref 3.5–5.2)
Alkaline Phosphatase: 118 U/L — ABNORMAL HIGH (ref 39–117)
BUN: 11 mg/dL (ref 6–23)
CO2: 34 mEq/L — ABNORMAL HIGH (ref 19–32)
Calcium: 9.8 mg/dL (ref 8.4–10.5)
Chloride: 100 mEq/L (ref 96–112)
Creatinine, Ser: 0.89 mg/dL (ref 0.40–1.50)
GFR: 103.55 mL/min (ref >60.00)
Glucose, Bld: 107 mg/dL — ABNORMAL HIGH (ref 70–99)
Potassium: 4.3 mEq/L (ref 3.5–5.1)
Sodium: 140 mEq/L (ref 135–145)
Total Bilirubin: 0.5 mg/dL (ref 0.2–1.2)
Total Protein: 7.1 g/dL (ref 6.0–8.3)

## 2021-11-02 LAB — LIPID PANEL
Cholesterol: 226 mg/dL — ABNORMAL HIGH (ref 0–200)
HDL: 37.7 mg/dL — ABNORMAL LOW (ref >39.00)
NonHDL: 188.75
Total CHOL/HDL Ratio: 6
Triglycerides: 214 mg/dL — ABNORMAL HIGH (ref 0.0–149.0)
VLDL: 42.8 mg/dL — ABNORMAL HIGH (ref 0.0–40.0)

## 2021-11-02 LAB — VITAMIN D 25 HYDROXY (VIT D DEFICIENCY, FRACTURES): VITD: 29.18 ng/mL — ABNORMAL LOW (ref 30.00–100.00)

## 2021-11-02 LAB — LDL CHOLESTEROL, DIRECT: Direct LDL: 167 mg/dL

## 2021-11-02 LAB — HEMOGLOBIN A1C: Hgb A1c MFr Bld: 6.1 % (ref 4.6–6.5)

## 2021-11-02 NOTE — Telephone Encounter (Signed)
-----   Message from Ellamae Sia sent at 10/26/2021  3:29 PM EDT ----- Regarding: Lab orders for Monday, 10.16.23 Patient is scheduled for CPX labs, please order future labs, Thanks , Karna Christmas

## 2021-11-02 NOTE — Progress Notes (Signed)
No critical labs need to be addressed urgently. We will discuss labs in detail at upcoming office visit.   

## 2021-11-09 ENCOUNTER — Encounter: Payer: Self-pay | Admitting: Family Medicine

## 2021-11-09 ENCOUNTER — Ambulatory Visit (INDEPENDENT_AMBULATORY_CARE_PROVIDER_SITE_OTHER): Payer: Medicare Other | Admitting: Family Medicine

## 2021-11-09 VITALS — BP 150/105 | HR 110 | Temp 98.6°F | Resp 16 | Ht 76.5 in | Wt 228.5 lb

## 2021-11-09 DIAGNOSIS — E559 Vitamin D deficiency, unspecified: Secondary | ICD-10-CM

## 2021-11-09 DIAGNOSIS — I890 Lymphedema, not elsewhere classified: Secondary | ICD-10-CM | POA: Diagnosis not present

## 2021-11-09 DIAGNOSIS — Z0000 Encounter for general adult medical examination without abnormal findings: Secondary | ICD-10-CM | POA: Diagnosis not present

## 2021-11-09 DIAGNOSIS — Z1211 Encounter for screening for malignant neoplasm of colon: Secondary | ICD-10-CM

## 2021-11-09 DIAGNOSIS — E7800 Pure hypercholesterolemia, unspecified: Secondary | ICD-10-CM | POA: Diagnosis not present

## 2021-11-09 DIAGNOSIS — R7309 Other abnormal glucose: Secondary | ICD-10-CM | POA: Diagnosis not present

## 2021-11-09 DIAGNOSIS — I10 Essential (primary) hypertension: Secondary | ICD-10-CM | POA: Diagnosis not present

## 2021-11-09 DIAGNOSIS — F251 Schizoaffective disorder, depressive type: Secondary | ICD-10-CM

## 2021-11-09 NOTE — Assessment & Plan Note (Signed)
Chronic, worsening control now with 14% cardiovascular risk in the next 10 years.  Statin is indicated but he would like to first work on lifestyle changes and diet changes.  Reviewed low-cholesterol diet in detail.

## 2021-11-09 NOTE — Patient Instructions (Addendum)
Limit carbohydrates.. continue decreasing sweet soda.  Work on decreasing cholesterol in diet... decrease sausage and cheese.  Start vit D 1000 Units daily. Return Cologuard for colon cancer screening.

## 2021-11-09 NOTE — Assessment & Plan Note (Signed)
Blood pressure elevated in the office today, he states his blood pressure is well controlled at home but he always is anxious when coming into the office.  He will continue to follow blood pressure at home and call if his blood pressure is greater than 150/90.

## 2021-11-09 NOTE — Assessment & Plan Note (Signed)
Chronic, stable continue working on [employer].  Reviewed low-carb diet with patient in detail.

## 2021-11-09 NOTE — Assessment & Plan Note (Signed)
Improved

## 2021-11-09 NOTE — Progress Notes (Signed)
Patient ID: Jeffrey Lin, male    DOB: [birth year], 46 y.o.   MRN: [id number]  This visit was conducted in person.  BP (!) 150/105   Pulse (!) 110   Temp 98.6 F (37 C)   Resp 16   Ht 6' 4.5" (1.943 m)   Wt 228 lb 8 oz (103.6 kg)   SpO2 96%   BMI 27.45 kg/m    CC: Physical   Subjective:   HPI: Jeffrey Lin is a 46 y.o. male presenting on 11/09/2021 for  physical  The patient saw a LPN or RN for medicare wellness visit. 04/2021  Prevention and wellness was reviewed in detail. Note reviewed and important notes copied below.    Chronic diastolic HF. Heart Catheterization 08/02/2019 No evidence of significant volume overload.   The patient does have mild pulmonary hypertension likely due to untreated sleep apnea.   Lower extremity lymphedema with severe chronic venous insufficiency and ulceration:  He has had significant improvement with treatments from lymphedema clinic including wraps, compression hose fitted for him and lymphedema pumps.  He is no longer requiring torsemide.     Significantly improved   Continue weight loss Body mass index is 27.45 kg/m.  Wt Readings from Last 3 Encounters:  11/09/21 228 lb 8 oz (103.6 kg)  07/03/21 236 lb 6 oz (107.2 kg)  04/25/21 280 lb (127 kg)    Hypertension:  Inadequate control in office today.  On Coreg 37.5 mg twice daily  BP Readings from Last 3 Encounters:  11/09/21 (!) 150/105  07/03/21 124/86  02/22/21 128/88  Using medication without problems or lightheadedness:  none Chest pain with exertion: none Edema: improved Short of breath: none Average home BPs: At home  130/80 Other issues:    Prediabetes  Lab Results  Component Value Date   HGBA1C 6.1 11/02/2021   Elevated Cholesterol:  Lab Results  Component Value Date   CHOL 226 (H) 11/02/2021   HDL 37.70 (L) 11/02/2021   LDLCALC 131 (H) 08/12/2019   LDLDIRECT 167.0 11/02/2021   TRIG 214.0 (H) 11/02/2021   CHOLHDL 6 11/02/2021  Using medications  without problems: Muscle aches:  Diet compliance:  moderate Exercise:  active outside Other complaints:   Current smoker:  working on quitting.. has decreased to 5 per day.   Schizoaffective disorder:  stable  Followed by psychiatry.. has appt coming up. Flowsheet Row Clinical Support from 04/25/2021 in [facility]  PHQ-2 Total Score 1       Vit  D def: not on supplement  Relevant past medical, surgical, family and social history reviewed and updated as indicated. Interim medical history since our last visit reviewed. Allergies and medications reviewed and updated. Outpatient Medications Prior to Visit  Medication Sig Dispense Refill   ALPRAZolam (XANAX) 1 MG tablet Take 1 mg by mouth 4 (four) times daily as needed.     amphetamine-dextroamphetamine (ADDERALL) 20 MG tablet Take 20 mg by mouth in the morning, at noon, and at bedtime.     carvedilol (COREG) 25 MG tablet Take 37.5 mg by mouth 2 (two) times daily with a meal.     gabapentin (NEURONTIN) 600 MG tablet Take 1,200 mg by mouth 3 (three) times daily.      tiZANidine (ZANAFLEX) 2 MG tablet TAKE 1 TABLET (2 MG TOTAL) BY MOUTH 3 (THREE) TIMES DAILY AS NEEDED FOR MUSCLE SPASMS. 270 tablet 0   No facility-administered medications prior to visit.  Per HPI unless specifically indicated in ROS section below Review of Systems  Constitutional:  Negative for fatigue and fever.  HENT:  Negative for ear pain.   Eyes:  Negative for pain.  Respiratory:  Negative for cough and shortness of breath.   Cardiovascular:  Negative for chest pain, palpitations and leg swelling.  Gastrointestinal:  Negative for abdominal pain.  Genitourinary:  Negative for dysuria.  Musculoskeletal:  Negative for arthralgias.  Neurological:  Negative for syncope, light-headedness and headaches.  Psychiatric/Behavioral:  Negative for dysphoric mood.    Objective:  BP (!) 150/105   Pulse (!) 110   Temp 98.6 F (37 C)   Resp 16    Ht 6' 4.5" (1.943 m)   Wt 228 lb 8 oz (103.6 kg)   SpO2 96%   BMI 27.45 kg/m   Wt Readings from Last 3 Encounters:  11/09/21 228 lb 8 oz (103.6 kg)  07/03/21 236 lb 6 oz (107.2 kg)  04/25/21 280 lb (127 kg)      Physical Exam Constitutional:      General: He is not in acute distress.    Appearance: Normal appearance. He is well-developed. He is not ill-appearing or toxic-appearing.  HENT:     Head: Normocephalic and atraumatic.     Right Ear: Hearing, tympanic membrane, ear canal and external ear normal.     Left Ear: Hearing, tympanic membrane, ear canal and external ear normal.     Nose: Nose normal.     Mouth/Throat:     Pharynx: Uvula midline.  Eyes:     General: Lids are normal. Lids are everted, no foreign bodies appreciated.     Conjunctiva/sclera: Conjunctivae normal.     Pupils: Pupils are equal, round, and reactive to light.  Neck:     Thyroid: No thyroid mass or thyromegaly.     Vascular: No carotid bruit.     Trachea: Trachea and phonation normal.  Cardiovascular:     Rate and Rhythm: Normal rate and regular rhythm.     Pulses: Normal pulses.     Heart sounds: S1 normal and S2 normal. No murmur heard.    No gallop.  Pulmonary:     Breath sounds: Normal breath sounds. No wheezing, rhonchi or rales.  Abdominal:     General: Bowel sounds are normal.     Palpations: Abdomen is soft.     Tenderness: There is no abdominal tenderness. There is no guarding or rebound.     Hernia: No hernia is present.  Musculoskeletal:     Cervical back: Normal range of motion and neck supple.  Lymphadenopathy:     Cervical: No cervical adenopathy.  Skin:    General: Skin is warm and dry.     Findings: No rash.  Neurological:     Mental Status: He is alert.     Cranial Nerves: No cranial nerve deficit.     Sensory: No sensory deficit.     Gait: Gait normal.     Deep Tendon Reflexes: Reflexes are normal and symmetric.  Psychiatric:        Speech: Speech normal.         Behavior: Behavior normal.        Judgment: Judgment normal.       Results for orders placed or performed in visit on 11/02/21  VITAMIN D 25 Hydroxy (Vit-D Deficiency, Fractures)  Result Value Ref Range   VITD 29.18 (L) 30.00 - 100.00 ng/mL  Comprehensive metabolic panel  Result Value  Ref Range   Sodium 140 135 - 145 mEq/L   Potassium 4.3 3.5 - 5.1 mEq/L   Chloride 100 96 - 112 mEq/L   CO2 34 (H) 19 - 32 mEq/L   Glucose, Bld 107 (H) 70 - 99 mg/dL   BUN 11 6 - 23 mg/dL   Creatinine, Ser 4.97 0.40 - 1.50 mg/dL   Total Bilirubin 0.5 0.2 - 1.2 mg/dL   Alkaline Phosphatase 118 (H) 39 - 117 U/L   AST 18 0 - 37 U/L   ALT 24 0 - 53 U/L   Total Protein 7.1 6.0 - 8.3 g/dL   Albumin 4.4 3.5 - 5.2 g/dL   GFR 026.37 >85.88 mL/min   Calcium 9.8 8.4 - 10.5 mg/dL  Lipid panel  Result Value Ref Range   Cholesterol 226 (H) 0 - 200 mg/dL   Triglycerides 502.7 (H) 0.0 - 149.0 mg/dL   HDL 74.12 (L) >87.86 mg/dL   VLDL 76.7 (H) 0.0 - 20.9 mg/dL   Total CHOL/HDL Ratio 6    NonHDL 188.75   Hemoglobin A1c  Result Value Ref Range   Hgb A1c MFr Bld 6.1 4.6 - 6.5 %  LDL cholesterol, direct  Result Value Ref Range   Direct LDL 167.0 mg/dL     COVID 19 screen:  No recent travel or known exposure to COVID19 The patient denies respiratory symptoms of COVID 19 at this time. The importance of social distancing was discussed today.   Assessment and Plan The patient's preventative maintenance and recommended screening tests for an annual wellness exam were reviewed in full today. Brought up to date unless services declined.  Counselled on the importance of diet, exercise, and its role in overall health and mortality. The patient's FH and SH was reviewed, including their home life, tobacco status, and drug and alcohol status.   Vaccines:  Due for  flu td, COVID19.  Discussed COVID19 vaccine side effects and benefits. Strongly encouraged the patient to get the vaccine. Questions answered. Refused  all all at this time. HIV neg: 03/2018  Colon cancer screen due... plan Cologuard  No family history of of prostate issues. Smoker..  5  day now.. precontempative for cessation.   Problem List Items Addressed This Visit     Essential hypertension, benign    Blood pressure elevated in the office today, he states his blood pressure is well controlled at home but he always is anxious when coming into the office.  He will continue to follow blood pressure at home and call if his blood pressure is greater than 150/90.       HYPERCHOLESTEROLEMIA    Chronic, worsening control now with 14% cardiovascular risk in the next 10 years.  Statin is indicated but he would like to first work on lifestyle changes and diet changes.  Reviewed low-cholesterol diet in detail.      Lymphedema of both lower extremities    Improved      PREDIABETES    Chronic, stable continue working on [employer].  Reviewed low-carb diet with patient in detail.      Schizoaffective disorder, depressive type (HCC)    Chronic, stable control.  Has upcoming appointment for follow-up with psychiatry      Vitamin D deficiency   Other Visit Diagnoses     Routine general medical examination at a health care facility    -  Primary   Colon cancer screening       Relevant Orders   Cologuard  Jeffrey Herbig, MD   

## 2021-11-09 NOTE — Assessment & Plan Note (Signed)
Chronic, stable control.  Has upcoming appointment for follow-up with psychiatry

## 2021-11-09 NOTE — Assessment & Plan Note (Signed)
Restart supplement 

## 2021-11-19 ENCOUNTER — Encounter (INDEPENDENT_AMBULATORY_CARE_PROVIDER_SITE_OTHER): Payer: Self-pay

## 2021-12-06 ENCOUNTER — Other Ambulatory Visit: Payer: Self-pay | Admitting: Family Medicine

## 2021-12-06 NOTE — Telephone Encounter (Signed)
Last office visit 11/09/21 for CPE.  Last refilled 09/26/21 for #270 with no refills.  CPE scheduled 11/12/22.

## 2022-02-28 ENCOUNTER — Other Ambulatory Visit: Payer: Self-pay | Admitting: Family Medicine

## 2022-02-28 NOTE — Telephone Encounter (Signed)
Last office visit 11/09/2021 for CPE.  Last refilled 12/06/21 for #270 with no refills.  Next Appt: 11/12/22 for CPE.

## 2022-03-28 ENCOUNTER — Ambulatory Visit: Payer: Medicare Other | Admitting: Occupational Therapy

## 2022-03-29 ENCOUNTER — Encounter: Payer: Self-pay | Admitting: Occupational Therapy

## 2022-03-29 ENCOUNTER — Ambulatory Visit: Payer: Medicare Other | Attending: Physician Assistant | Admitting: Occupational Therapy

## 2022-03-29 DIAGNOSIS — I890 Lymphedema, not elsewhere classified: Secondary | ICD-10-CM | POA: Diagnosis not present

## 2022-03-29 NOTE — Patient Instructions (Signed)

## 2022-03-29 NOTE — Therapy (Signed)
[place] at [facility] [place], [place], [address] Phone: [phone]   Fax:  [phone]  Occupational Therapy Re-evaluation, Progress Report and Treatment Note  BLE/BLQ Lymphedema  Patient Details  Name: Jeffrey Lin MRN: [id number] Date of Birth: 06-07-1975 No data recorded  Encounter Date: 03/29/2022   OT End of Session - 03/29/22 0901     Visit Number 30    Number of Visits 36    Date for OT Re-Evaluation 06/27/22    OT Start Time 0800    OT Stop Time 0900    OT Time Calculation (min) 60 min    Equipment Utilized During Treatment Jobst RELAX HOS device sample    Activity Tolerance Patient tolerated treatment well;No increased pain    Behavior During Therapy [facility] for tasks assessed/performed             Past Medical History:  Diagnosis Date   Anxiety    Chronic leg pain    Depression    Essential hypertension, benign 10/20/2015   Schizoaffective disorder [facility])     Past Surgical History:  Procedure Laterality Date   Block procedure at pain center  03/27/06   Bone graft from hip to jaw  2004   Bone graft right side of head to jaw  2006   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   Subjective Assessment - 03/29/22 [id number]     Subjective  Jeffrey Lin returns for OT follow-along during self-management phase of CDT for BLE lymphedema. He was last seen for garment replacement/ fitting on 10/04/21. Pt presents with existing custom compression knee highs in place. He denies leg pain.He states he is 100% compliant with daily conmpression garments as directed. He iuses a basic vasopneumatic pump recommended  by vien and vascular several dfays weekly. Pt states his garments need replacement and he would like to explore HOS devices to reduce leg pain and swelling over night.    Patient is accompanied by: Family member    Pertinent History H&P relevant to BLE lymphedema:   tobacco abuse, HTN, hx BLE ulcers, hx cellulitis, Hx DVT, Hx patellar fx, chronic BLE pain, chronic BLE lymphedema, neuropathy, Hx pneumonia, snoring. chronic diastolic heart failure, sinus tachycardia, chronic back pain, psychotic mood disorder/ Schitzoaffective disorder- depressive type, snoring    Limitations chronic leg pain and swelling, decreased balance,    Repetition Increases Symptoms    Special Tests Intake FOTO: 45/100    Patient Stated Goals keep pain and swelling down in my legs    Currently in Pain? No/denies    Pain Onset More than a month ago   approximately 4 years ago   Aggravating Factors  walking, standing and dependent sitting > 15 minutes, hot weather    Pain Relieving Factors compression, elevation, MLD    Effect of Pain on Daily Activities chronic leg swelling and associated pain limits functional performance of basic and instrumental ADLs, productive activities and leisure pursuits, social participation, role performance and body image    Pain Onset More than a month ago  OT Treatments/Exercises (OP) - 03/29/22 [id number]       ADLs   ADL Education Given Yes      Manual Therapy   Manual Therapy Edema management    Edema Management garment and device measurement, recommendations and education                    OT Education - 03/29/22 0929     Education Details Compression garment and device assessment,  recommendations, and educatyion    Person(s) Educated Patient    Methods Explanation;Demonstration;Handout    Comprehension Verbalized understanding;Returned demonstration;Need further instruction                 OT Long Term Goals - 03/29/22  0917       OT LONG TERM GOAL #1   Title Patient's intake functional score on the FOTO tool is 45 out of 100 (higher number = greater function). Given this patient's comorbidities she will experience at least an increase in function of 4 points, or higher, to increase level of independence with basic ADLs and functional ambulation and mobility.    Baseline 45/100    Time 12    Period Weeks    Status Achieved   final score up 29 points from 45/100 to 74/100. Goal met     OT LONG TERM GOAL #2   Title Pt will be able to apply multi-layer, short stretch compression wraps to one leg at a time using correct gradient techniques with maximum caregiver assistance in an effort  to return the affected  limb(s)  to premorbid size and shape, to limit pain and infection risk, and to improve functional mobility and ambulation  for ADLs.    Baseline dependent    Time 4    Period Days    Status Achieved   Goal met and exceeded. Pt independent with applying wraps and laundering them.     OT LONG TERM GOAL #3   Title Pt will achieve at least 10% limb volume reduction Bilaterally during Intensive Phase CDT to prevent re-accumulation of lymphatic congestion and progression of fibrosis, to limit infection risk, to improve functional ambulation and transfers, and to promote wound healing.    Baseline dependent    Time 12    Period Weeks    Status Achieved   met bilaterally     OT LONG TERM GOAL #4   Title With max caregiver assistance Pt will achieve and sustain a least 85% compliance with all LE self-care home program components throughout Intensive Phase CDT, including elevation when seated, daily skin inspection and care, lymphatic pumping ther ex, 23/7 compression wraps and simple self-MLD. Max caregiver assistance is necessary to ensure optimal limb volume reduction, to limit infection risk and to limit LE progression. Pt will arrange for assistance before we begin CDT.    Baseline dependent    Time 12     Period Weeks    Status Achieved   Met and exceeded. Pt remains 100% compliant with all LE self-care home program components     OT LONG TERM GOAL #5   Title Pt will be independent with donning , doffing, wear and care regimes of custom compression garments by issue date for optimal lymphedema self care over time.    Baseline Max A    Time 12    Period Days   6th OT Rx visit   Status New   LLE custom garment and HOS device ordered. Awaiting fitting.  Long Term Additional Goals   Additional Long Term Goals Yes      OT LONG TERM GOAL #6   Title Pt will be independent with donning and doffing, operation, care of device and precuations Flexitouch device to ensure optimal effectiveness of assistive device needed to limit lymphedema related infection, progression and further functional decline.                   Plan - 03/29/22 0904     Clinical Impression Statement Jeffrey Lin returns today for OT follow along  for BLE lymphedema management, Pt is managing well between visits in general. Existing compression garments are over 39 year old, sjow signs of wear and are in need of replacement. Replace existing custom Elvarex ccl 3 knee highs with same. The custom garments are necessary to fit Pt's existing limb size and shape, and for optimal effectiveness of fluid and fibrosis control to limit LE progression. Pt remains 100% compliant w compression. Pt'ds skin is mildly dry and flaking today, increasing infection risk. Pt agrees with recommendation to replace current skin lotion with thicker Eucerin cream for more effective moisturizing. Pt will benefit from a custom Jobst RELAX, ccl 2 nighttime devices bilaterally to ffacilitate improved lymphatic circulation and limit fibrosis formation during hours of sleep, again to limit LE progression. Pt will benefit from an advanced Tactile Medical Flexitouch PLUS device for optimal lymphedema self management at home over time. Pt currently uses a basic  "pump" several days weekly issued by vascular provider over a year ago with considerable signs and symptoms of lower extremity lymphedema remaining. We'll schedule a trial in the clinic with device if benefits allow access to this superior device. Pt will return for custom garment and device fitting one thewse items are shipped. Please review GOALS section for progress towards OT goals for LE self-management . Cont as per POC    OT Occupational Profile and History Comprehensive Assessment- Review of records and extensive additional review of physical, cognitive, psychosocial history related to current functional performance    Occupational performance deficits (Please refer to evaluation for details): IADL's;ADL's;Rest and Sleep;Work;Leisure;Social Participation   w max CG assist for home program   Body Structure / Function / Physical Skills ADL;Edema;Skin integrity;Pain;Decreased knowledge of precautions;Decreased knowledge of use of DME;IADL    Rehab Potential Good    Clinical Decision Making Several treatment options, min-mod task modification necessary    Comorbidities Affecting Occupational Performance: Presence of comorbidities impacting occupational performance    Comorbidities impacting occupational performance description: suspected CVI, hx cellulitis, hx dvt    Modification or Assistance to Complete Evaluation  Min-Moderate modification of tasks or assist with assess necessary to complete eval    OT Frequency Other (comment)   2 visits for custom compression fitting, 2 visits for Flexitouch trial, 2 visits for troubleshooting and remeasurement should needs arise. 6 visits total proposed.   OT Duration Other (comment)   follow along PRN to support self care of chronic-progressive BLE lymphedema   OT Treatment/Interventions Self-care/ADL training;Therapeutic exercise;Manual Therapy;Coping strategies training;Therapeutic activities;Manual lymph drainage;DME and/or AE instruction;Compression  bandaging;Other (comment);Patient/family education   skin care with low ph castor oil and/ or eucerin lotion   Plan Replace existing custom Elvarex SOFT , ccl 3 A-D. Flexitouch trial    OT Home Exercise Plan lymphatic pumping ther ex 2 x daily, 10 reps each, in order; daily skin care, daily MLD    Recommended Other Services fit with BLE, knee length, custom compression garments and  HOS devices needed to limit worsening fibrosis formation during HOS. Consider fitting with advanced, sequential , pneumatic compression device, or Flexitouch "pump" medically necessary for optimal level of independence with LE self-management at home over time    Consulted and Agree with Plan of Care Patient             Patient will benefit from skilled therapeutic intervention in order to improve the following deficits and impairments:   Body Structure / Function / Physical Skills: ADL, Edema, Skin integrity, Pain, Decreased knowledge of precautions, Decreased knowledge of use of DME, IADL       Visit Diagnosis: Lymphedema, not elsewhere classified    Problem List Patient Active Problem List   Diagnosis Date Noted   Sebaceous cyst 07/03/2021   Lymphedema of both lower extremities 09/09/2019   Benign prostatic hyperplasia with lower urinary tract symptoms 07/02/2019   Thiamine deficiency 12/28/2018   Peripheral neuropathy 05/07/2017   Hoarseness of voice 11/23/2015   Essential hypertension, benign 10/20/2015   Schizoaffective disorder, depressive type ([place]) 02/17/2015   Vitamin D deficiency 02/14/2015   Psychotic mood disorder ([place]) 05/14/2012   History of DVT (deep vein thrombosis) 07/06/2010   History of patellar fracture 07/06/2010   HYPERCHOLESTEROLEMIA 10/13/2009   PREDIABETES 10/13/2009   Anxiety state 06/02/2006   TOBACCO ABUSE 06/02/2006   Chronic back pain 06/02/2006    Andrey Spearman, MS, OTR/L, [facility] 03/29/22 9:32 AM   [place] at  [facility] [place], [place], [address] Phone: [phone]   Fax:  [phone]  Name: Jeffrey Lin MRN: [id number] Date of Birth: May 17, 1975

## 2022-04-17 DIAGNOSIS — I890 Lymphedema, not elsewhere classified: Secondary | ICD-10-CM | POA: Diagnosis not present

## 2022-04-23 ENCOUNTER — Telehealth: Payer: Self-pay | Admitting: Family Medicine

## 2022-04-23 NOTE — Telephone Encounter (Signed)
Contacted Elza Rafter to schedule their annual wellness visit. Appointment made for 11/07/2022.  [place] Direct Dial: [phone]

## 2022-05-03 ENCOUNTER — Ambulatory Visit: Payer: Medicare Other | Admitting: Occupational Therapy

## 2022-05-06 ENCOUNTER — Ambulatory Visit: Payer: Medicare Other | Attending: Physician Assistant | Admitting: Occupational Therapy

## 2022-05-06 DIAGNOSIS — I890 Lymphedema, not elsewhere classified: Secondary | ICD-10-CM | POA: Diagnosis not present

## 2022-05-06 NOTE — Therapy (Signed)
[facility] at [facility] [address] [place], [place], [address] Phone: [phone]   Fax:  [phone]  Occupational Therapy Treatment  Patient Details  Name: Jeffrey Lin MRN: [id number] Date of Birth: 1975/11/10 No data recorded  Encounter Date: 05/06/2022   OT End of Session - 05/06/22 0858     Visit Number 31    Number of Visits 36    Date for OT Re-Evaluation 06/27/22    OT Start Time 0900    OT Stop Time 0930    OT Time Calculation (min) 30 min    Equipment Utilized During Treatment --    Activity Tolerance Patient tolerated treatment well;No increased pain    Behavior During Therapy [facility] for tasks assessed/performed             Past Medical History:  Diagnosis Date   Anxiety    Chronic leg pain    Depression    Essential hypertension, benign 10/20/2015   Schizoaffective disorder [facility])     Past Surgical History:  Procedure Laterality Date   Block procedure at pain center  03/27/06   Bone graft from hip to jaw  2004   Bone graft right side of head to jaw  2006   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   Subjective Assessment - 05/06/22 0943     Subjective  [name] returns for OT follow-along during self-management phase of CDT for BLE lymphedema. Mr Wiberg was last seen on 03/29/22. He presents with existing custom compression knee highs in place. He denies leg pain.He states he is 100% compliant with daily conmpression garments as directed. He continues to use a basic vasopneumatic pump recommended by vien and vascular several dfays weekly.    Patient is accompanied by: Family member    Pertinent  History H&P relevant to BLE lymphedema:   tobacco abuse, HTN, hx BLE ulcers, hx cellulitis, Hx DVT, Hx patellar fx, chronic BLE pain, chronic BLE lymphedema, neuropathy, Hx pneumonia, snoring. chronic diastolic heart failure, sinus tachycardia, chronic back pain, psychotic mood disorder/ Schitzoaffective disorder- depressive type, snoring    Limitations chronic leg pain and swelling, decreased balance,    Repetition Increases Symptoms    Special Tests Intake FOTO: 45/100    Patient Stated Goals keep pain and swelling down in my legs    Currently in Pain? No/denies    Pain Onset More than a month ago   approximately 4 years ago   Pain Onset More than a month ago                          OT Treatments/Exercises (OP) - 05/06/22 0945       ADLs   ADL Education Given Yes      Manual Therapy   Manual Therapy Edema management    Edema Management replacement garment and device education                    OT Education - 05/06/22 0954     Education Details Continued skilled Pt/caregiver education  And LE ADL training throughout visit for lymphedema self care/  home program, including compression wrapping, compression garment and device wear/care, lymphatic pumping ther ex, simple self-MLD, and skin care. Discussed initial volumetric measurements. Discussed all long term goals.    Person(s) Educated Patient;Parent(s)    Methods Explanation;Demonstration;Handout    Comprehension Verbalized understanding;Returned demonstration                 OT Long Term Goals - 05/06/22 0942       OT LONG TERM GOAL #1   Title Patient's intake functional score on the FOTO tool is 45 out of 100 (higher number = greater function). Given this patient's comorbidities she will experience at least an increase in function of 4 points, or higher, to increase level of independence with basic ADLs and functional ambulation and mobility.    Baseline 45/100    Time 12    Period Weeks     Status Achieved   final score up 29 points from 45/100 to 74/100. Goal met     OT LONG TERM GOAL #2   Title Pt will be able to apply multi-layer, short stretch compression wraps to one leg at a time using correct gradient techniques with maximum caregiver assistance in an effort  to return the affected  limb(s)  to premorbid size and shape, to limit pain and infection risk, and to improve functional mobility and ambulation  for ADLs.    Baseline dependent    Time 4    Period Days    Status Achieved   Goal met and exceeded. Pt independent with applying wraps and laundering them.     OT LONG TERM GOAL #3   Title Pt will achieve at least 10% limb volume reduction Bilaterally during Intensive Phase CDT to prevent re-accumulation of lymphatic congestion and progression of fibrosis, to limit infection risk, to improve functional ambulation and transfers, and to promote wound healing.    Baseline dependent    Time 12    Period Weeks    Status Achieved   met bilaterally     OT LONG TERM GOAL #4   Title With max caregiver assistance Pt will achieve and sustain a least 85% compliance with all LE self-care home program components throughout Intensive Phase CDT, including elevation when seated, daily skin inspection and care, lymphatic pumping ther ex, 23/7 compression wraps and simple self-MLD. Max caregiver assistance is necessary to ensure optimal limb volume reduction, to limit infection risk and to limit LE progression. Pt will arrange for assistance before we begin CDT.    Baseline dependent    Time 12    Period Weeks    Status Achieved   Met and exceeded. Pt remains 100% compliant with all LE self-care home program components     OT LONG TERM GOAL #5   Title Pt will be independent with donning , doffing, wear and care regimes of custom compression garments by issue date for optimal lymphedema self care over time.    Baseline Max A    Time 12    Period Days   6th OT Rx visit   Status  Achieved   LLE custom garment and HOS device ordered. Awaiting fitting.     OT LONG TERM GOAL #6   Title Pt will be independent with donning and doffing, operation, care of device and precuations Flexitouch device to ensure optimal effectiveness of assistive device needed to limit lymphedema related infection, progression and further functional decline.    Status Achieved  Plan - 05/06/22 0947     Clinical Impression Statement Mr Rizk returns today for OT follow along  for BLE lymphedema management, Pt is managing well between visits in general. Pt remains 100% compliant w compression. Pt'ds skin is mildly dry and flaking today. BLE custom ccl 3 Elvarex SOFT knee highs (3 pr)  and custom Jobst RELAX, ccl 2 nighttime device fitr well and provided appropriate compression and containment. Pt is able to don and doff garments and devices with extra time and assistive devices (Tyvek socks) after skilled instruction. Pt verbalizes wear and replacement schedules, laundty instructions and precautions after education and review.Pt has not yet completed a Flexitouch trial. He will benefit from an advanced Tactile Medical Flexitouch PLUS device for optimal lymphedema self management at home over time. Pt currently uses a basic "pump" several days weekly issued by vascular provider over a year ago with considerable signs and symptoms of lower extremity lymphedema remaining. We'll schedule a trial in the clinic with device if benefits allow access to this superior device.Cont as per POC    OT Occupational Profile and History Comprehensive Assessment- Review of records and extensive additional review of physical, cognitive, psychosocial history related to current functional performance    Occupational performance deficits (Please refer to evaluation for details): IADL's;ADL's;Rest and Sleep;Work;Leisure;Social Participation   w max CG assist for home program   Body Structure / Function /  Physical Skills ADL;Edema;Skin integrity;Pain;Decreased knowledge of precautions;Decreased knowledge of use of DME;IADL    Rehab Potential Good    Clinical Decision Making Several treatment options, min-mod task modification necessary    Comorbidities Affecting Occupational Performance: Presence of comorbidities impacting occupational performance    Comorbidities impacting occupational performance description: suspected CVI, hx cellulitis, hx dvt    Modification or Assistance to Complete Evaluation  Min-Moderate modification of tasks or assist with assess necessary to complete eval    OT Frequency Other (comment)   2 visits for custom compression fitting, 2 visits for Flexitouch trial, 2 visits for troubleshooting and remeasurement should needs arise. 6 visits total proposed.   OT Duration Other (comment)   follow along PRN to support self care of chronic-progressive BLE lymphedema   OT Treatment/Interventions Self-care/ADL training;Therapeutic exercise;Manual Therapy;Coping strategies training;Therapeutic activities;Manual lymph drainage;DME and/or AE instruction;Compression bandaging;Other (comment);Patient/family education   skin care with low ph castor oil and/ or eucerin lotion   Plan Replace existing custom Elvarex SOFT , ccl 3 A-D. Flexitouch trial    OT Home Exercise Plan lymphatic pumping ther ex 2 x daily, 10 reps each, in order; daily skin care, daily MLD    Recommended Other Services fit with BLE, knee length, custom compression garments and HOS devices needed to limit worsening fibrosis formation during HOS. Consider fitting with advanced, sequential , pneumatic compression device, or Flexitouch "pump" medically necessary for optimal level of independence with LE self-management at home over time    Consulted and Agree with Plan of Care Patient             Patient will benefit from skilled therapeutic intervention in order to improve the following deficits and impairments:   Body  Structure / Function / Physical Skills: ADL, Edema, Skin integrity, Pain, Decreased knowledge of precautions, Decreased knowledge of use of DME, IADL       Visit Diagnosis: Lymphedema, not elsewhere classified    Problem List Patient Active Problem List   Diagnosis Date Noted   Sebaceous cyst 07/03/2021   Lymphedema of both lower extremities 09/09/2019  Benign prostatic hyperplasia with lower urinary tract symptoms 07/02/2019   Thiamine deficiency 12/28/2018   Peripheral neuropathy 05/07/2017   Hoarseness of voice 11/23/2015   Essential hypertension, benign 10/20/2015   Schizoaffective disorder, depressive type 02/17/2015   Vitamin D deficiency 02/14/2015   Psychotic mood disorder 05/14/2012   History of DVT (deep vein thrombosis) 07/06/2010   History of patellar fracture 07/06/2010   HYPERCHOLESTEROLEMIA 10/13/2009   PREDIABETES 10/13/2009   Anxiety state 06/02/2006   TOBACCO ABUSE 06/02/2006   Chronic back pain 06/02/2006    Loel Dubonnet, MS, OTR/L, [facility] 05/06/22 9:54 AM   [place] [facility] at [facility] [address] [place], [place], [address] Phone: [phone]   Fax:  [phone]  Name: MYKAL BATIZ MRN: [id number] Date of Birth: Jul 11, 1975

## 2022-05-06 NOTE — Patient Instructions (Signed)

## 2022-05-15 ENCOUNTER — Other Ambulatory Visit: Payer: Self-pay | Admitting: Family Medicine

## 2022-05-15 NOTE — Telephone Encounter (Signed)
Last office visit 11/09/2021 for CPE.  Last refilled 02/28/2022 for #270 with no refills.  Next Appt: CPE 11/12/2022.

## 2022-05-28 ENCOUNTER — Encounter: Payer: Self-pay | Admitting: Family Medicine

## 2022-05-28 ENCOUNTER — Ambulatory Visit (INDEPENDENT_AMBULATORY_CARE_PROVIDER_SITE_OTHER): Payer: Medicare Other | Admitting: Family Medicine

## 2022-05-28 VITALS — BP 140/88 | HR 111 | Temp 98.6°F | Ht 76.5 in | Wt 262.0 lb

## 2022-05-28 DIAGNOSIS — T148XXA Other injury of unspecified body region, initial encounter: Secondary | ICD-10-CM | POA: Insufficient documentation

## 2022-05-28 DIAGNOSIS — L089 Local infection of the skin and subcutaneous tissue, unspecified: Secondary | ICD-10-CM | POA: Insufficient documentation

## 2022-05-28 DIAGNOSIS — I890 Lymphedema, not elsewhere classified: Secondary | ICD-10-CM

## 2022-05-28 MED ORDER — DOXYCYCLINE HYCLATE 100 MG PO TABS: 100.0000 mg | ORAL_TABLET | Freq: Two times a day (BID) | ORAL | 0 refills | Status: DC

## 2022-05-28 NOTE — Assessment & Plan Note (Signed)
Acute, left anterior shin hematoma, no change in sensation and minimal pain but patient does have chronic neuropathy bilaterally. No clear suggestion of compartment syndrome. Associated skin changes potentially due to infected hematoma. Recommend heat, elevation and complete course of doxycycline 100 mg p.o. twice daily. Follow-up in 1 week for reevaluation.

## 2022-05-28 NOTE — Assessment & Plan Note (Signed)
Chronic, previously well-controlled with compression hose and lymphedema pump

## 2022-05-28 NOTE — Progress Notes (Signed)
Patient ID: Jeffrey Lin, male    DOB: 1975/06/06, 47 y.o.   MRN: [id number]  This visit was conducted in person.  BP (!) 140/88   Pulse (!) 111   Temp 98.6 F (37 C) (Temporal)   Ht 6' 4.5" (1.943 m)   Wt 262 lb (118.8 kg)   SpO2 98%   BMI 31.48 kg/m    CC:  Chief Complaint  Patient presents with   Leg Pain    Fall last weekend. Left leg pain     Subjective:   HPI: Jeffrey Lin is a 47 y.o. male presenting on 05/28/2022 for Leg Pain (Fall last weekend. Left leg pain )   New onset pain in left leg after falling, tripped over bleacher 05/18/22, lost balance. No proceeding symptoms.  No head injury. No LOC. Noted swelling immediately.     Has been icing, elevating, wrapped area with ACE bandage.  He has been wearing compression hose.  Hx of lymphedema, uses compression hose, lymphedema pump.  Relevant past medical, surgical, family and social history reviewed and updated as indicated. Interim medical history since our last visit reviewed. Allergies and medications reviewed and updated. Outpatient Medications Prior to Visit  Medication Sig Dispense Refill   ALPRAZolam (XANAX) 1 MG tablet Take 1 mg by mouth 4 (four) times daily as needed.     amantadine (SYMMETREL) 100 MG capsule Take 100 mg by mouth 2 (two) times daily.     amphetamine-dextroamphetamine (ADDERALL) 20 MG tablet Take 20 mg by mouth in the morning, at noon, and at bedtime.     carvedilol (COREG) 25 MG tablet Take 37.5 mg by mouth 2 (two) times daily with a meal.     gabapentin (NEURONTIN) 600 MG tablet Take 1,200 mg by mouth 3 (three) times daily.      risperiDONE (RISPERDAL) 2 MG tablet Take 2 mg by mouth at bedtime.     tiZANidine (ZANAFLEX) 2 MG tablet TAKE 1 TABLET (2 MG TOTAL) BY MOUTH 3 (THREE) TIMES DAILY AS NEEDED FOR MUSCLE SPASMS. 270 tablet 0   Vilazodone HCl (VIIBRYD) 40 MG TABS Take 40 mg by mouth daily.     No facility-administered medications prior to visit.     Per HPI unless  specifically indicated in ROS section below Review of Systems  Constitutional:  Negative for fatigue and fever.  HENT:  Negative for ear pain.   Eyes:  Negative for pain.  Respiratory:  Negative for cough and shortness of breath.   Cardiovascular:  Positive for leg swelling. Negative for chest pain and palpitations.  Gastrointestinal:  Negative for abdominal pain.  Genitourinary:  Negative for dysuria.  Musculoskeletal:  Negative for arthralgias.  Neurological:  Negative for syncope, light-headedness and headaches.  Psychiatric/Behavioral:  Negative for dysphoric mood.    Objective:  BP (!) 140/88   Pulse (!) 111   Temp 98.6 F (37 C) (Temporal)   Ht 6' 4.5" (1.943 m)   Wt 262 lb (118.8 kg)   SpO2 98%   BMI 31.48 kg/m   Wt Readings from Last 3 Encounters:  05/28/22 262 lb (118.8 kg)  11/09/21 228 lb 8 oz (103.6 kg)  07/03/21 236 lb 6 oz (107.2 kg)      Physical Exam Constitutional:      Appearance: He is well-developed.  HENT:     Head: Normocephalic.     Right Ear: Hearing normal.     Left Ear: Hearing normal.     Nose: Nose  normal.  Neck:     Thyroid: No thyroid mass or thyromegaly.     Vascular: No carotid bruit.     Trachea: Trachea normal.  Cardiovascular:     Rate and Rhythm: Normal rate and regular rhythm.     Pulses: Normal pulses.     Heart sounds: Heart sounds not distant. No murmur heard.    No friction rub. No gallop.     Comments: No peripheral edema Pulmonary:     Effort: Pulmonary effort is normal. No respiratory distress.     Breath sounds: Normal breath sounds.  Skin:    General: Skin is warm and dry.     Findings: No rash.  Psychiatric:        Speech: Speech normal.        Behavior: Behavior normal.        Thought Content: Thought content normal.          Results for orders placed or performed in visit on 11/02/21  VITAMIN D 25 Hydroxy (Vit-D Deficiency, Fractures)  Result Value Ref Range   VITD 29.18 (L) 30.00 - 100.00 ng/mL   Comprehensive metabolic panel  Result Value Ref Range   Sodium 140 135 - 145 mEq/L   Potassium 4.3 3.5 - 5.1 mEq/L   Chloride 100 96 - 112 mEq/L   CO2 34 (H) 19 - 32 mEq/L   Glucose, Bld 107 (H) 70 - 99 mg/dL   BUN 11 6 - 23 mg/dL   Creatinine, Ser 1.61 0.40 - 1.50 mg/dL   Total Bilirubin 0.5 0.2 - 1.2 mg/dL   Alkaline Phosphatase 118 (H) 39 - 117 U/L   AST 18 0 - 37 U/L   ALT 24 0 - 53 U/L   Total Protein 7.1 6.0 - 8.3 g/dL   Albumin 4.4 3.5 - 5.2 g/dL   GFR 096.04 >54.09 mL/min   Calcium 9.8 8.4 - 10.5 mg/dL  Lipid panel  Result Value Ref Range   Cholesterol 226 (H) 0 - 200 mg/dL   Triglycerides 811.9 (H) 0.0 - 149.0 mg/dL   HDL 14.78 (L) >29.56 mg/dL   VLDL 21.3 (H) 0.0 - 08.6 mg/dL   Total CHOL/HDL Ratio 6    NonHDL 188.75   Hemoglobin A1c  Result Value Ref Range   Hgb A1c MFr Bld 6.1 4.6 - 6.5 %  LDL cholesterol, direct  Result Value Ref Range   Direct LDL 167.0 mg/dL    Assessment and Plan  Infected hematoma Assessment & Plan: Acute, left anterior shin hematoma, no change in sensation and minimal pain but patient does have chronic neuropathy bilaterally. No clear suggestion of compartment syndrome. Associated skin changes potentially due to infected hematoma. Recommend heat, elevation and complete course of doxycycline 100 mg p.o. twice daily. Follow-up in 1 week for reevaluation.   Lymphedema of both lower extremities Assessment & Plan: Chronic, previously well-controlled with compression hose and lymphedema pump   Other orders -     Doxycycline Hyclate; Take 1 tablet (100 mg total) by mouth 2 (two) times daily.  Dispense: 20 tablet; Refill: 0    Return in about 1 week (around 06/04/2022) for  follow up hematoma.   Kerby Nora, MD

## 2022-05-28 NOTE — Patient Instructions (Addendum)
Complete antibiotics for possible infected hematoma.  Elevate leg above heart.  Apply heat, not ice.   Marland Kitchen

## 2022-06-04 ENCOUNTER — Encounter: Payer: Self-pay | Admitting: Family Medicine

## 2022-06-04 ENCOUNTER — Ambulatory Visit (INDEPENDENT_AMBULATORY_CARE_PROVIDER_SITE_OTHER): Payer: Medicare Other | Admitting: Family Medicine

## 2022-06-04 VITALS — BP 120/70 | HR 108 | Temp 97.2°F | Ht 76.5 in | Wt 261.1 lb

## 2022-06-04 DIAGNOSIS — L089 Local infection of the skin and subcutaneous tissue, unspecified: Secondary | ICD-10-CM

## 2022-06-04 DIAGNOSIS — G6289 Other specified polyneuropathies: Secondary | ICD-10-CM

## 2022-06-04 DIAGNOSIS — I890 Lymphedema, not elsewhere classified: Secondary | ICD-10-CM | POA: Diagnosis not present

## 2022-06-04 DIAGNOSIS — T148XXA Other injury of unspecified body region, initial encounter: Secondary | ICD-10-CM

## 2022-06-04 DIAGNOSIS — S8012XD Contusion of left lower leg, subsequent encounter: Secondary | ICD-10-CM | POA: Diagnosis not present

## 2022-06-04 MED ORDER — CEFTRIAXONE SODIUM 1 G IJ SOLR
1.0000 g | Freq: Once | INTRAMUSCULAR | Status: AC
Start: 2022-06-04 — End: 2022-06-04
  Administered 2022-06-04: 1 g via INTRAMUSCULAR

## 2022-06-04 NOTE — Progress Notes (Signed)
Patient ID: Jeffrey Lin, male    DOB: [birth year], 47 y.o.   MRN: [id number]  This visit was conducted in person.  BP 120/70 (BP Location: Left Arm, Patient Position: Sitting, Cuff Size: Large)   Pulse (!) 108   Temp (!) 97.2 F (36.2 C) (Temporal)   Ht 6' 4.5" (1.943 m)   Wt 261 lb 2 oz (118.4 kg)   SpO2 94%   BMI 31.37 kg/m    CC:  Chief Complaint  Patient presents with   Hematoma    Follow up infected hematoma    Subjective:   HPI: Jeffrey Lin is a 47 y.o. male presenting on 06/04/2022 for Hematoma (Follow up infected hematoma)     Seen on 05/28/2022 for infected hematoma of left lower leg.  Treated with course of doxycycline 100 mg p.o. twice daily, today on day 7 of 10.   Has been trying to heat the area, elevating, wrapped area with ACE bandage.  The area opened up and started draining... clearish yellowish bloody fluid.  No fever, no N/V. NO flu like symptoms.  Now pain with  weight  on legs.   Hx of lymphedema, uses compression hose, lymphedema pump.  Relevant past medical, surgical, family and social history reviewed and updated as indicated. Interim medical history since our last visit reviewed. Allergies and medications reviewed and updated. Outpatient Medications Prior to Visit  Medication Sig Dispense Refill   ALPRAZolam (XANAX) 1 MG tablet Take 1 mg by mouth 4 (four) times daily as needed.     amantadine (SYMMETREL) 100 MG capsule Take 100 mg by mouth 2 (two) times daily.     amphetamine-dextroamphetamine (ADDERALL) 20 MG tablet Take 20 mg by mouth in the morning, at noon, and at bedtime.     carvedilol (COREG) 25 MG tablet Take 37.5 mg by mouth 2 (two) times daily with a meal.     doxycycline (VIBRA-TABS) 100 MG tablet Take 1 tablet (100 mg total) by mouth 2 (two) times daily. 20 tablet 0   gabapentin (NEURONTIN) 600 MG tablet Take 1,200 mg by mouth 3 (three) times daily.      risperiDONE (RISPERDAL) 2 MG tablet Take 2 mg by mouth at bedtime.      tiZANidine (ZANAFLEX) 2 MG tablet TAKE 1 TABLET (2 MG TOTAL) BY MOUTH 3 (THREE) TIMES DAILY AS NEEDED FOR MUSCLE SPASMS. 270 tablet 0   Vilazodone HCl (VIIBRYD) 40 MG TABS Take 40 mg by mouth daily.     No facility-administered medications prior to visit.     Per HPI unless specifically indicated in ROS section below Review of Systems  Constitutional:  Negative for fatigue and fever.  HENT:  Negative for ear pain.   Eyes:  Negative for pain.  Respiratory:  Negative for cough and shortness of breath.   Cardiovascular:  Positive for leg swelling. Negative for chest pain and palpitations.  Gastrointestinal:  Negative for abdominal pain.  Genitourinary:  Negative for dysuria.  Musculoskeletal:  Negative for arthralgias.  Neurological:  Negative for syncope, light-headedness and headaches.  Psychiatric/Behavioral:  Negative for dysphoric mood.    Objective:  BP 120/70 (BP Location: Left Arm, Patient Position: Sitting, Cuff Size: Large)   Pulse (!) 108   Temp (!) 97.2 F (36.2 C) (Temporal)   Ht 6' 4.5" (1.943 m)   Wt 261 lb 2 oz (118.4 kg)   SpO2 94%   BMI 31.37 kg/m   Wt Readings from Last 3 Encounters:  06/04/22  261 lb 2 oz (118.4 kg)  05/28/22 262 lb (118.8 kg)  11/09/21 228 lb 8 oz (103.6 kg)      Physical Exam Constitutional:      Appearance: He is well-developed.  HENT:     Head: Normocephalic.     Right Ear: Hearing normal.     Left Ear: Hearing normal.     Nose: Nose normal.  Neck:     Thyroid: No thyroid mass or thyromegaly.     Vascular: No carotid bruit.     Trachea: Trachea normal.  Cardiovascular:     Rate and Rhythm: Normal rate and regular rhythm.     Pulses: Normal pulses.     Heart sounds: Heart sounds not distant. No murmur heard.    No friction rub. No gallop.  Pulmonary:     Effort: Pulmonary effort is normal. No respiratory distress.     Breath sounds: Normal breath sounds.  Musculoskeletal:     Right lower leg: No edema.     Left lower leg: 1+  Pitting Edema present.  Skin:    General: Skin is warm and dry.     Findings: No rash.  Psychiatric:        Speech: Speech normal.        Behavior: Behavior normal.        Thought Content: Thought content normal.    Results for orders placed or performed in visit on 11/02/21  VITAMIN D 25 Hydroxy (Vit-D Deficiency, Fractures)  Result Value Ref Range   VITD 29.18 (L) 30.00 - 100.00 ng/mL  Comprehensive metabolic panel  Result Value Ref Range   Sodium 140 135 - 145 mEq/L   Potassium 4.3 3.5 - 5.1 mEq/L   Chloride 100 96 - 112 mEq/L   CO2 34 (H) 19 - 32 mEq/L   Glucose, Bld 107 (H) 70 - 99 mg/dL   BUN 11 6 - 23 mg/dL   Creatinine, Ser 4.40 0.40 - 1.50 mg/dL   Total Bilirubin 0.5 0.2 - 1.2 mg/dL   Alkaline Phosphatase 118 (H) 39 - 117 U/L   AST 18 0 - 37 U/L   ALT 24 0 - 53 U/L   Total Protein 7.1 6.0 - 8.3 g/dL   Albumin 4.4 3.5 - 5.2 g/dL   GFR 347.42 >59.56 mL/min   Calcium 9.8 8.4 - 10.5 mg/dL  Lipid panel  Result Value Ref Range   Cholesterol 226 (H) 0 - 200 mg/dL   Triglycerides 387.5 (H) 0.0 - 149.0 mg/dL   HDL 64.33 (L) >29.51 mg/dL   VLDL 88.4 (H) 0.0 - 16.6 mg/dL   Total CHOL/HDL Ratio 6    NonHDL 188.75   Hemoglobin A1c  Result Value Ref Range   Hgb A1c MFr Bld 6.1 4.6 - 6.5 %  LDL cholesterol, direct  Result Value Ref Range   Direct LDL 167.0 mg/dL    Assessment and Plan  Infected hematoma Assessment & Plan: Acute, minimal improvement with oral antibiotics, in likely infected hematoma versus abscess in high risk patient given history of peripheral neuropathy and lymphedema. Surrounding erythema is improved but previously from lesion felt to be hematoma is now soft and fluctuant.  No clear indication for hospital admission. Will refer to general surgery for consultation, possible incision and drainage given minimal improvement and high risk patient. Given injection of ceftriaxone 1 g in office today.  Patient to continue doxycycline and elevation of  leg.    Lymphedema of both lower extremities Assessment & Plan:  Hold compression hose on the left side and less using very loose compression at ankle, elevate legs.   Other polyneuropathy     No follow-ups on file.   Kerby Nora, MD

## 2022-06-04 NOTE — Addendum Note (Signed)
Addended by: Kerby Nora E on: 06/04/2022 09:39 AM   Modules accepted: Orders

## 2022-06-04 NOTE — Patient Instructions (Signed)
We will call with stat referral.  Elevated legs,  continue oral antibiotics.

## 2022-06-04 NOTE — Assessment & Plan Note (Addendum)
Acute, minimal improvement with oral antibiotics, in likely infected hematoma versus abscess in high risk patient given history of peripheral neuropathy and lymphedema. Surrounding erythema is improved but previously from lesion felt to be hematoma is now soft and fluctuant.  No clear indication for hospital admission. Will refer to general surgery for consultation, possible incision and drainage given minimal improvement and high risk patient. Given injection of ceftriaxone 1 g in office today.  Patient to continue doxycycline and elevation of leg.

## 2022-06-04 NOTE — Addendum Note (Signed)
Addended by: Damita Lack on: 06/04/2022 09:42 AM   Modules accepted: Orders

## 2022-06-04 NOTE — Assessment & Plan Note (Signed)
Hold compression hose on the left side and less using very loose compression at ankle, elevate legs.

## 2022-06-05 ENCOUNTER — Ambulatory Visit: Payer: Medicare Other | Admitting: Surgery

## 2022-06-05 ENCOUNTER — Encounter: Payer: Self-pay | Admitting: Surgery

## 2022-06-05 VITALS — BP 146/90 | HR 96 | Temp 98.0°F | Ht 77.0 in | Wt 264.0 lb

## 2022-06-05 DIAGNOSIS — S8012XA Contusion of left lower leg, initial encounter: Secondary | ICD-10-CM | POA: Diagnosis not present

## 2022-06-05 NOTE — Patient Instructions (Signed)
This dressing needs to be changed every day. Replace the gauze every day and wrap with coban.  You may shower, remove the dressing and gauze first. Let the soapy water run over the area, rinse well, and pat dry. Then redress the area with gauze and coban.   Keep your leg elevated as much as possible. Finish all of your antibiotics.    Follow up here in 2 weeks.

## 2022-06-06 ENCOUNTER — Encounter: Payer: Medicare Other | Admitting: Occupational Therapy

## 2022-06-08 ENCOUNTER — Encounter: Payer: Self-pay | Admitting: Surgery

## 2022-06-08 NOTE — Consult Note (Signed)
Patient ID: Jeffrey Lin, male   DOB: July 17, 1975, 47 y.o.   MRN: [id number]  HPI Jeffrey Lin is a 47 y.o. male seen in consultation at the request of Dr. Ermalene Searing. He was Seen by her on 05/28/2022 for infected hematoma of left lower leg.  Treated with course of doxycycline 100 mg p.o. twice daily.He Has been trying to heat the area, elevating, wrapped area with ACE bandage.  The area opened up and started draining... clearish yellowish bloody fluid. He reports persistent pain and pressure to the left leg mild to moderate intensity without any aggravating or alleviating factors.  He does have history of lymphedema.  No fever, no N/V. NO flu like symptoms.  Now pain with  weight  on legs. Hx of lymphedema, uses compression hose, lymphedema pump. 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   RIGHT HEART CATH AND CORONARY ANGIOGRAPHY Right 08/02/2019   Procedure: RIGHT HEART CATH AND CORONARY ANGIOGRAPHY;  Surgeon: Iran Ouch, MD;  Location: [facility] INVASIVE CV LAB;  Service: Cardiovascular;  Laterality: Right;   Sphenopalatine ganglionic      Family History  Problem Relation Age of Onset   Hyperlipidemia Mother    Hypertension Mother    Depression Mother    Anxiety disorder Mother    Lung disease Father    Depression Brother    Anxiety disorder Brother    Bipolar disorder Maternal Grandfather    Schizophrenia Maternal Grandfather    Bipolar disorder Paternal Grandfather     Social History Social History   Tobacco Use   Smoking status: Every Day    Packs/day: 1.50    Years: 23.00    Additional pack years:  0.00    Total pack years: 34.50    Types: Cigarettes    Start date: 09/22/1995    Passive exposure: Past   Smokeless tobacco: Former   Tobacco comments:    Pt states that he is cutting back - currently at 1/2 PPD  (as of 07/27/19)  Vaping Use   Vaping Use: Never used  Substance Use Topics   Alcohol use: No    Alcohol/week: 0.0 standard drinks of alcohol   Drug use: No    Allergies  Allergen Reactions   Toradol [Ketorolac Tromethamine] Swelling   Lamotrigine Rash    Current Outpatient Medications  Medication Sig Dispense Refill   ALPRAZolam (XANAX) 1 MG tablet Take 1 mg by mouth 4 (four) times daily as needed.     amantadine (SYMMETREL) 100 MG capsule Take 100 mg by mouth 2 (two) times daily.     amphetamine-dextroamphetamine (ADDERALL) 20 MG tablet Take 20 mg by mouth in the morning, at noon, and at bedtime.     carvedilol (COREG) 25 MG tablet Take 37.5 mg by mouth 2 (two) times daily with a meal.     doxycycline (VIBRA-TABS) 100 MG tablet Take 1 tablet (100 mg total) by mouth 2 (two) times daily. 20 tablet 0   gabapentin (NEURONTIN) 600 MG tablet Take 1,200 mg  by mouth 3 (three) times daily.      risperiDONE (RISPERDAL) 2 MG tablet Take 2 mg by mouth at bedtime.     tiZANidine (ZANAFLEX) 2 MG tablet TAKE 1 TABLET (2 MG TOTAL) BY MOUTH 3 (THREE) TIMES DAILY AS NEEDED FOR MUSCLE SPASMS. 270 tablet 0   Vilazodone HCl (VIIBRYD) 40 MG TABS Take 40 mg by mouth daily.     No current facility-administered medications for this visit.     Review of Systems Full ROS  was asked and was negative except for the information on the HPI  Physical Exam Blood pressure (!) 146/90, pulse 96, temperature 98 F (36.7 C), height 6\' 5"  (1.956 m), weight 264 lb (119.7 kg), SpO2 98 %. CONSTITUTIONAL: NAD, chronically ill. EYES: Pupils are equal, round,  to light, Sclera are non-icteric. EARS, NOSE, MOUTH AND THROAT:  The oral mucosa is pink and moist. Hearing is intact to voice. LYMPH NODES:  Lymph  nodes in the neck are normal. RESPIRATORY:  Lungs are clear. There is normal respiratory effort, with equal breath sounds bilaterally, and without pathologic use of accessory muscles. CARDIOVASCULAR: Heart is regular without murmurs, gallops, or rubs. GI: The abdomen is  soft, nontender, and nondistended. There are no palpable masses. There is no hepatosplenomegaly. There are normal bowel sounds in all quadrants. GU: Rectal deferred.   MUSCULOSKELETAL: There is evidence of significant edema and swelling as well as induration and fluctuance on the left leg lateral aspect.  There are some staining blood and drainage from a tiny open wound.  There is no evidence of necrotizing infection there is no evidence of compartment syndrome SKIN: Turgor is good and there are no pathologic skin lesions or ulcers. NEUROLOGIC: Motor and sensation is grossly normal. Cranial nerves are grossly intact. PSYCH:  Oriented to person, place and time. Affect is normal.  Data Reviewed  I have personally reviewed the patient's imaging, laboratory findings and medical records.    Assessment/Plan 47 year old male with infected hematoma of the left leg.  Patient already has lymphedema as a baseline condition and the mass effect that the hematoma is causing can even further impair his lymphedema and potentially create a compartment syndrome.  I definitely recommend prompt incision and drainage of this hematoma.  Procedure discussed with the patient in detail.  Risk, benefits and possible occasions including but not limited to: Bleeding, infection potential reintervention, pain.  He understands and wishes to proceed. Please note that I have spent 45 minutes in this encounter including personally reviewing medical records, placing orders, counseling the patient and performing appropriate documentation.  Procedure note: Diagnosis: Infected hematoma on the left leg  Procedures: Incision and drainage of deep left leg  hematoma Debridement of the skin and fascia measuring 6 cm  Anesthesia: Lidocaine 1% with epi  EBL minimal for the procedure  Findings: Large deep hematoma with approximately 150 cc of clots and dark clot draining from the leg.  After informed consent was obtained the patient was placed in supine position and the leg was prepped and draped in the usual sterile fashion.  Local anesthetic was used to infiltrate the skin and subcutaneous tissue.  Using a 15 blade knife an elliptical incision was created.  We were able to use a hemostat to access the deep portion of the leg and we immediately encountered a gush of dark blood.  We finger fracture some of the loculations and obtain at least 150 cc of clots and Gallbladder.  There was also evidence of  some devitalized fascia that were able to debride with scissors in an excisional form.  Hemostasis was obtained with pressure.  I elevated the leg and place a compression Coban to aid with the lymphedema.  There were no immediate complications  Sterling Big, MD FACS General Surgeon 06/08/2022, 2:19 PM

## 2022-06-08 NOTE — Progress Notes (Signed)
HPI Jeffrey Lin is a 47 y.o. male seen in consultation at the request of Dr. Ermalene Searing. He was Seen by her on 05/28/2022 for infected hematoma of left lower leg.  Treated with course of doxycycline 100 mg p.o. twice daily.He Has been trying to heat the area, elevating, wrapped area with ACE bandage.  The area opened up and started draining... clearish yellowish bloody fluid. He reports persistent pain and pressure to the left leg mild to moderate intensity without any aggravating or alleviating factors.  He does have history of lymphedema.   No fever, no N/V. NO flu like symptoms.  Now pain with  weight  on legs. Hx of lymphedema, uses compression hose, lymphedema pump. 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   RIGHT HEART CATH AND CORONARY ANGIOGRAPHY Right 08/02/2019    Procedure: RIGHT HEART CATH AND CORONARY ANGIOGRAPHY;  Surgeon: Iran Ouch, MD;  Location: [facility] INVASIVE CV LAB;  Service: Cardiovascular;  Laterality: Right;   Sphenopalatine ganglionic               Family History  Problem Relation Age of Onset   Hyperlipidemia Mother     Hypertension Mother     Depression Mother     Anxiety disorder Mother     Lung disease Father     Depression Brother     Anxiety disorder Brother     Bipolar disorder Maternal Grandfather     Schizophrenia Maternal Grandfather     Bipolar disorder Paternal Grandfather        Social History Social History         Tobacco Use   Smoking status: Every Day      Packs/day: 1.50      Years: 23.00      Additional pack years: 0.00      Total  pack years: 34.50      Types: Cigarettes      Start date: 09/22/1995      Passive exposure: Past   Smokeless tobacco: Former   Tobacco comments:      Pt states that he is cutting back - currently at 1/2 PPD  (as of 07/27/19)  Vaping Use   Vaping Use: Never used  Substance Use Topics   Alcohol use: No      Alcohol/week: 0.0 standard drinks of alcohol   Drug use: No          Allergies  Allergen Reactions   Toradol [Ketorolac Tromethamine] Swelling   Lamotrigine Rash            Current Outpatient Medications  Medication Sig Dispense Refill   ALPRAZolam (XANAX) 1 MG tablet Take 1 mg by mouth 4 (four) times daily as needed.       amantadine (SYMMETREL) 100 MG capsule Take 100 mg by mouth 2 (two) times daily.       amphetamine-dextroamphetamine (ADDERALL) 20 MG  tablet Take 20 mg by mouth in the morning, at noon, and at bedtime.       carvedilol (COREG) 25 MG tablet Take 37.5 mg by mouth 2 (two) times daily with a meal.       doxycycline (VIBRA-TABS) 100 MG tablet Take 1 tablet (100 mg total) by mouth 2 (two) times daily. 20 tablet 0   gabapentin (NEURONTIN) 600 MG tablet Take 1,200 mg by mouth 3 (three) times daily.        risperiDONE (RISPERDAL) 2 MG tablet Take 2 mg by mouth at bedtime.       tiZANidine (ZANAFLEX) 2 MG tablet TAKE 1 TABLET (2 MG TOTAL) BY MOUTH 3 (THREE) TIMES DAILY AS NEEDED FOR MUSCLE SPASMS. 270 tablet 0   Vilazodone HCl (VIIBRYD) 40 MG TABS Take 40 mg by mouth daily.        No current facility-administered medications for this visit.        Review of Systems Full ROS  was asked and was negative except for the information on the HPI   Physical Exam Blood pressure (!) 146/90, pulse 96, temperature 98 F (36.7 C), height 6\' 5"  (1.956 m), weight 264 lb (119.7 kg), SpO2 98 %. CONSTITUTIONAL: NAD, chronically ill. EYES: Pupils are equal, round,  to light, Sclera are non-icteric. EARS, NOSE, MOUTH AND THROAT:  The oral mucosa is pink and moist. Hearing is intact  to voice. LYMPH NODES:  Lymph nodes in the neck are normal. RESPIRATORY:  Lungs are clear. There is normal respiratory effort, with equal breath sounds bilaterally, and without pathologic use of accessory muscles. CARDIOVASCULAR: Heart is regular without murmurs, gallops, or rubs. GI: The abdomen is  soft, nontender, and nondistended. There are no palpable masses. There is no hepatosplenomegaly. There are normal bowel sounds in all quadrants. GU: Rectal deferred.   MUSCULOSKELETAL: There is evidence of significant edema and swelling as well as induration and fluctuance on the left leg lateral aspect.  There are some staining blood and drainage from a tiny open wound.  There is no evidence of necrotizing infection there is no evidence of compartment syndrome SKIN: Turgor is good and there are no pathologic skin lesions or ulcers. NEUROLOGIC: Motor and sensation is grossly normal. Cranial nerves are grossly intact. PSYCH:  Oriented to person, place and time. Affect is normal.   Data Reviewed   I have personally reviewed the patient's imaging, laboratory findings and medical records.     Assessment/Plan 47 year old male with infected hematoma of the left leg.  Patient already has lymphedema as a baseline condition and the mass effect that the hematoma is causing can even further impair his lymphedema and potentially create a compartment syndrome.  I definitely recommend prompt incision and drainage of this hematoma.  Procedure discussed with the patient in detail.  Risk, benefits and possible occasions including but not limited to: Bleeding, infection potential reintervention, pain.  He understands and wishes to proceed. Please note that I have spent 45 minutes in this encounter including personally reviewing medical records, placing orders, counseling the patient and performing appropriate documentation.   Procedure note: Diagnosis: Infected hematoma on the left leg   Procedures: Incision and  drainage of deep left leg hematoma Debridement of the skin and fascia measuring 6 cm   Anesthesia: Lidocaine 1% with epi   EBL minimal for the procedure   Findings: Large deep hematoma with approximately 150 cc of clots and dark clot draining from the leg.   After informed consent  was obtained the patient was placed in supine position and the leg was prepped and draped in the usual sterile fashion.  Local anesthetic was used to infiltrate the skin and subcutaneous tissue.  Using a 15 blade knife an elliptical incision was created.  We were able to use a hemostat to access the deep portion of the leg and we immediately encountered a gush of dark blood.  We finger fracture some of the loculations and obtain at least 150 cc of clots and Gallbladder.  There was also evidence of some devitalized fascia that were able to debride with scissors in an excisional form.  Hemostasis was obtained with pressure.  I elevated the leg and place a compression Coban to aid with the lymphedema.  There were no immediate complications   Sterling Big, MD [facility] General Surgeon

## 2022-06-11 ENCOUNTER — Encounter: Payer: Self-pay | Admitting: Family Medicine

## 2022-06-11 ENCOUNTER — Ambulatory Visit (INDEPENDENT_AMBULATORY_CARE_PROVIDER_SITE_OTHER): Payer: Medicare Other | Admitting: Family Medicine

## 2022-06-11 VITALS — BP 138/97 | HR 115 | Temp 97.7°F | Ht 76.5 in | Wt 257.2 lb

## 2022-06-11 DIAGNOSIS — L089 Local infection of the skin and subcutaneous tissue, unspecified: Secondary | ICD-10-CM

## 2022-06-11 DIAGNOSIS — I890 Lymphedema, not elsewhere classified: Secondary | ICD-10-CM

## 2022-06-11 DIAGNOSIS — I10 Essential (primary) hypertension: Secondary | ICD-10-CM

## 2022-06-11 NOTE — Assessment & Plan Note (Signed)
Blood pressure elevated in the office today, he states his blood pressure is well controlled at home but he always is anxious when coming into the office.  He will continue to follow blood pressure at home and call if his blood pressure is greater than 150/90.  He is not currently taking any medication at home for blood pressure, he will check his blood pressure at home with an arm cuff and let me know if it is persistently above 140/90.  If so we will restart Coreg 25 mg twice daily

## 2022-06-11 NOTE — Assessment & Plan Note (Signed)
Acute, status post incision and drainage, significant improvement No clear associated infection at this time.  Recommended continue elevation and compression of leg given history of lymphedema. Keep follow-up with surgeon as planned.

## 2022-06-11 NOTE — Progress Notes (Signed)
Patient ID: Jeffrey Lin, male    DOB: 01-04-1976, 47 y.o.   MRN: [id number]  This visit was conducted in person.  BP (!) 138/97 (BP Location: Left Wrist, Patient Position: Sitting, Cuff Size: Normal) Comment: Patient's home wrist monitor  Pulse (!) 115   Temp 97.7 F (36.5 C) (Temporal)   Ht 6' 4.5" (1.943 m)   Wt 257 lb 4 oz (116.7 kg)   SpO2 93%   BMI 30.91 kg/m    CC:  Chief Complaint  Patient presents with   Cough   Dizziness    Thinks it side effects from Viibryd-Stopped taking on Sunday and feels like lightheadedness has gotten better since stopping meds   Hypertension    Subjective:   HPI: Jeffrey Lin is a 47 y.o. male presenting on 06/11/2022 for Cough, Dizziness (Thinks it side effects from Viibryd-Stopped taking on Sunday and feels like lightheadedness has gotten better since stopping meds), and Hypertension  Reviewed office visit note from general surgery for hematoma of left lower extremity 06/05/2022 Dr. Everlene Farrier Recommended incision and drainage, performed 515 in office Completed course of doxycycline Status post 1 injection of Rocephin on 5/14 HAs follow up in 2 1week.   He continues to have draiange at site... clear yellowish bloody drainage. No fever. Swelling is better. Minimal pain.    He does report some dizziness but felt it was side effects from his mood medication Viibryd ( had changed to Pristiq and had weaned off and titrated onto Viibryd over last 2 weeks).  He stopped taking this on Sunday and feels like lightheadedness is gotten better.  He has noted blood pressure measurements elevated recently On Coreg 25 mg twice daily BP Readings from Last 3 Encounters:  06/11/22 (!) 138/97  06/05/22 (!) 146/90  06/04/22 120/70           Relevant past medical, surgical, family and social history reviewed and updated as indicated. Interim medical history since our last visit reviewed. Allergies and medications reviewed and updated. Outpatient  Medications Prior to Visit  Medication Sig Dispense Refill   ALPRAZolam (XANAX) 1 MG tablet Take 1 mg by mouth 4 (four) times daily as needed.     amantadine (SYMMETREL) 100 MG capsule Take 100 mg by mouth 2 (two) times daily.     amphetamine-dextroamphetamine (ADDERALL) 20 MG tablet Take 20 mg by mouth in the morning, at noon, and at bedtime.     gabapentin (NEURONTIN) 600 MG tablet Take 1,200 mg by mouth 3 (three) times daily.      risperiDONE (RISPERDAL) 2 MG tablet Take 2 mg by mouth at bedtime.     tiZANidine (ZANAFLEX) 2 MG tablet TAKE 1 TABLET (2 MG TOTAL) BY MOUTH 3 (THREE) TIMES DAILY AS NEEDED FOR MUSCLE SPASMS. 270 tablet 0   carvedilol (COREG) 25 MG tablet Take 37.5 mg by mouth 2 (two) times daily with a meal.     Vilazodone HCl (VIIBRYD) 40 MG TABS Take 40 mg by mouth daily. (Patient not taking: Reported on 06/11/2022)     doxycycline (VIBRA-TABS) 100 MG tablet Take 1 tablet (100 mg total) by mouth 2 (two) times daily. 20 tablet 0   No facility-administered medications prior to visit.     Per HPI unless specifically indicated in ROS section below Review of Systems  Constitutional:  Negative for fatigue and fever.  HENT:  Negative for ear pain.   Eyes:  Negative for pain.  Respiratory:  Negative for cough and shortness  of breath.   Cardiovascular:  Negative for chest pain, palpitations and leg swelling.  Gastrointestinal:  Negative for abdominal pain.  Genitourinary:  Negative for dysuria.  Musculoskeletal:  Negative for arthralgias.  Neurological:  Positive for light-headedness. Negative for syncope and headaches.  Psychiatric/Behavioral:  Negative for dysphoric mood.    Objective:  BP (!) 138/97 (BP Location: Left Wrist, Patient Position: Sitting, Cuff Size: Normal) Comment: Patient's home wrist monitor  Pulse (!) 115   Temp 97.7 F (36.5 C) (Temporal)   Ht 6' 4.5" (1.943 m)   Wt 257 lb 4 oz (116.7 kg)   SpO2 93%   BMI 30.91 kg/m   Wt Readings from Last 3  Encounters:  06/11/22 257 lb 4 oz (116.7 kg)  06/05/22 264 lb (119.7 kg)  06/04/22 261 lb 2 oz (118.4 kg)      Physical Exam Constitutional:      Appearance: He is well-developed.  HENT:     Head: Normocephalic.     Right Ear: Hearing normal.     Left Ear: Hearing normal.     Nose: Nose normal.  Neck:     Thyroid: No thyroid mass or thyromegaly.     Vascular: No carotid bruit.     Trachea: Trachea normal.  Cardiovascular:     Rate and Rhythm: Normal rate and regular rhythm.     Pulses: Normal pulses.     Heart sounds: Heart sounds not distant. No murmur heard.    No friction rub. No gallop.     Comments: No peripheral edema Pulmonary:     Effort: Pulmonary effort is normal. No respiratory distress.     Breath sounds: Normal breath sounds.  Skin:    General: Skin is warm and dry.     Findings: No rash.  Psychiatric:        Speech: Speech normal.        Behavior: Behavior normal.        Thought Content: Thought content normal.          Results for orders placed or performed in visit on 11/02/21  VITAMIN D 25 Hydroxy (Vit-D Deficiency, Fractures)  Result Value Ref Range   VITD 29.18 (L) 30.00 - 100.00 ng/mL  Comprehensive metabolic panel  Result Value Ref Range   Sodium 140 135 - 145 mEq/L   Potassium 4.3 3.5 - 5.1 mEq/L   Chloride 100 96 - 112 mEq/L   CO2 34 (H) 19 - 32 mEq/L   Glucose, Bld 107 (H) 70 - 99 mg/dL   BUN 11 6 - 23 mg/dL   Creatinine, Ser 4.09 0.40 - 1.50 mg/dL   Total Bilirubin 0.5 0.2 - 1.2 mg/dL   Alkaline Phosphatase 118 (H) 39 - 117 U/L   AST 18 0 - 37 U/L   ALT 24 0 - 53 U/L   Total Protein 7.1 6.0 - 8.3 g/dL   Albumin 4.4 3.5 - 5.2 g/dL   GFR 811.91 >47.82 mL/min   Calcium 9.8 8.4 - 10.5 mg/dL  Lipid panel  Result Value Ref Range   Cholesterol 226 (H) 0 - 200 mg/dL   Triglycerides 956.2 (H) 0.0 - 149.0 mg/dL   HDL 13.08 (L) >65.78 mg/dL   VLDL 46.9 (H) 0.0 - 62.9 mg/dL   Total CHOL/HDL Ratio 6    NonHDL 188.75   Hemoglobin A1c   Result Value Ref Range   Hgb A1c MFr Bld 6.1 4.6 - 6.5 %  LDL cholesterol, direct  Result Value Ref Range  Direct LDL 167.0 mg/dL    Assessment and Plan  Essential hypertension, benign Assessment & Plan: Blood pressure elevated in the office today, he states his blood pressure is well controlled at home but he always is anxious when coming into the office.  He will continue to follow blood pressure at home and call if his blood pressure is greater than 150/90.  He is not currently taking any medication at home for blood pressure, he will check his blood pressure at home with an arm cuff and let me know if it is persistently above 140/90.  If so we will restart Coreg 25 mg twice daily   Infected hematoma Assessment & Plan: Acute, status post incision and drainage, significant improvement No clear associated infection at this time.  Recommended continue elevation and compression of leg given history of lymphedema. Keep follow-up with surgeon as planned.   Lymphedema of both lower extremities    No follow-ups on file.   Kerby Nora, MD

## 2022-06-19 ENCOUNTER — Encounter: Payer: Self-pay | Admitting: Surgery

## 2022-06-19 ENCOUNTER — Ambulatory Visit (INDEPENDENT_AMBULATORY_CARE_PROVIDER_SITE_OTHER): Payer: Medicare Other | Admitting: Surgery

## 2022-06-19 VITALS — BP 154/100 | HR 93 | Temp 97.8°F | Ht 78.0 in | Wt 257.2 lb

## 2022-06-19 DIAGNOSIS — Z09 Encounter for follow-up examination after completed treatment for conditions other than malignant neoplasm: Secondary | ICD-10-CM

## 2022-06-19 DIAGNOSIS — S8012XD Contusion of left lower leg, subsequent encounter: Secondary | ICD-10-CM

## 2022-06-19 NOTE — Patient Instructions (Addendum)
If you have any concerns or questions, please feel free to call our office. See follow up appointment.   Hematoma A hematoma is a collection of blood. A hematoma can happen: Under the skin. In an organ. In a body space. In a joint space. In other tissues. The blood can thicken (clot) to form a lump that you can see and feel. The lump is often hard and may become sore and tender. The lump can be very small or very big. Most hematomas get better in a few days to weeks. However, some of these may be serious and need medical care. What are the causes? This condition is caused by: An injury. Blood that leaks under the skin. Problems from surgeries. Medical conditions that cause bleeding or bruising. What increases the risk? You are more likely to develop this condition if: You are an older adult. You use medicines that thin your blood. You use NSAIDs, such as ibuprofen, often for pain. You play contact sports. What are the signs or symptoms? Symptoms depend on where the hematoma is in your body. If the hematoma is under the skin, there is: A firm lump on the body. Pain and tenderness in the area. Bruising. The skin above the lump may be blue, dark blue, purple-red, or yellowish. If the hematoma is deep in the tissues or body spaces, there may be: Blood in the stomach. This may cause pain in the belly (abdomen), weakness, passing out (fainting), and shortness of breath. Blood in the head. This may cause a headache, weakness, trouble speaking or understanding speech, or passing out. How is this treated? Treatment depends on the cause, size, and location of the hematoma. Treatment may include: Doing nothing. Many hematomas go away on their own without treatment. Surgery or close monitoring. This may be needed for large hematomas or hematomas that affect the body's organs. Medicines. These may be given if a medical condition caused the hematoma. Follow these instructions at home: Managing  pain, stiffness, and swelling  If told, put ice on the injured area. To do this: Put ice in a plastic bag. Place a towel between your skin and the bag. Leave the ice on for 20 minutes, 2-3 times a day for the first two days. If your skin turns bright red, take off the ice right away to prevent skin damage. The risk of skin damage is higher if you cannot feel pain, heat, or cold. If told, put heat on the injured area. Do this as often as told by your doctor. Use the heat source that your doctor recommends, such as a moist heat pack or a heating pad. Place a towel between your skin and the heat source. Leave the heat on for 20-30 minutes. If your skin turns bright red, take off the heat right away to prevent burns. The risk of burns is higher if you cannot feel pain, heat, or cold. Raise the injured area above the level of your heart while you are sitting or lying down. Wrap the affected area with an elastic bandage, if told by your doctor. Do not wrap the bandage too tight. If your hematoma is on a leg or foot and is painful, your doctor may give you crutches. Use them as told by your doctor. General instructions Take over-the-counter and prescription medicines only as told by your doctor. Keep all follow-up visits. Your doctor may want to see how your hematoma is healing with treatment. Contact a doctor if: You have a fever. The swelling  or bruising gets worse. You start to get more hematomas. Your pain gets worse. Your pain is not getting better with medicine. The skin over the hematoma breaks or starts to bleed. Get help right away if: Your hematoma is in your chest or belly and you: Pass out. Feel weak. Become short of breath. You have a hematoma on your scalp that is caused by a fall or injury, and you: Have a headache that gets worse. Have trouble speaking or understanding speech. Become less alert or you pass out. These symptoms may be an emergency. Get help right away. Call  911. Do not wait to see if the symptoms will go away. Do not drive yourself to the hospital This information is not intended to replace advice given to you by your health care provider. Make sure you discuss any questions you have with your health care provider. Document Revised: 07/02/2021 Document Reviewed: 07/02/2021 Elsevier Patient Education  2024 [employer].

## 2022-06-20 ENCOUNTER — Encounter: Payer: Self-pay | Admitting: Surgery

## 2022-06-20 NOTE — Progress Notes (Signed)
  Surgical Consultation  06/20/2022  Jeffrey Lin is an 47 y.o. male.   Chief Complaint  Patient presents with   Follow-up     HPI: s/p leg evacuation of hematoma w debridement.  Significant improvement.  No evidence of compartment syndrome.  No fevers no chills He does have also right temple soft tissue mass that wishes to have this addressed.  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   RIGHT HEART CATH AND CORONARY ANGIOGRAPHY Right 08/02/2019   Procedure: RIGHT HEART CATH AND CORONARY ANGIOGRAPHY;  Surgeon: Iran Ouch, MD;  Location: [facility] INVASIVE CV LAB;  Service: Cardiovascular;  Laterality: Right;   Sphenopalatine ganglionic      Family History  Problem Relation Age of Onset   Hyperlipidemia Mother    Hypertension Mother    Depression Mother    Anxiety disorder Mother    Lung disease Father    Depression Brother    Anxiety disorder Brother    Bipolar disorder Maternal Grandfather    Schizophrenia Maternal Grandfather    Bipolar disorder Paternal Grandfather     Social History:  reports that he has been smoking cigarettes. He started smoking about 26 years ago. He has a 34.50 pack-year smoking history. He has been exposed to tobacco smoke. He has quit using smokeless tobacco. He reports that he does not drink alcohol and does not use drugs.  Allergies:  Allergies  Allergen Reactions   Toradol [Ketorolac Tromethamine] Swelling   Lamotrigine Rash    Medications reviewed.     ROS Full ROS performed and is otherwise negative other than what is  stated in the HPI    BP (!) 154/100   Pulse 93   Temp 97.8 F (36.6 C) (Oral)   Ht 6\' 6"  (1.981 m)   Wt 257 lb 3.2 oz (116.7 kg)   SpO2 91%   BMI 29.72 kg/m   Physical Exam Chronically ill in NAD Head: 2 cm right temple soft tissue mass Ext: Leg with out evidence of complications.  Infected hematoma has completely resolved.  There are no open wounds.  He has some trace edema but with significant improvement.   Assessment/Plan: Doing well, no complications May schedule soft tissue mass excision at his [facility], MD [facility] General Surgeon

## 2022-06-28 DIAGNOSIS — H269 Unspecified cataract: Secondary | ICD-10-CM | POA: Diagnosis not present

## 2022-06-28 DIAGNOSIS — H52223 Regular astigmatism, bilateral: Secondary | ICD-10-CM | POA: Diagnosis not present

## 2022-06-28 DIAGNOSIS — H5213 Myopia, bilateral: Secondary | ICD-10-CM | POA: Diagnosis not present

## 2022-06-28 DIAGNOSIS — H524 Presbyopia: Secondary | ICD-10-CM | POA: Diagnosis not present

## 2022-07-04 ENCOUNTER — Encounter: Payer: Self-pay | Admitting: Family Medicine

## 2022-07-04 ENCOUNTER — Ambulatory Visit (INDEPENDENT_AMBULATORY_CARE_PROVIDER_SITE_OTHER): Payer: Medicare Other | Admitting: Family Medicine

## 2022-07-04 VITALS — BP 146/100 | HR 102 | Temp 98.3°F | Ht 76.5 in | Wt 261.4 lb

## 2022-07-04 DIAGNOSIS — I10 Essential (primary) hypertension: Secondary | ICD-10-CM | POA: Diagnosis not present

## 2022-07-04 DIAGNOSIS — E7800 Pure hypercholesterolemia, unspecified: Secondary | ICD-10-CM | POA: Diagnosis not present

## 2022-07-04 MED ORDER — CARVEDILOL 25 MG PO TABS
25.0000 mg | ORAL_TABLET | Freq: Two times a day (BID) | ORAL | 3 refills | Status: DC
Start: 2022-07-04 — End: 2022-09-27

## 2022-07-04 MED ORDER — ATORVASTATIN CALCIUM 10 MG PO TABS: 10.0000 mg | ORAL_TABLET | Freq: Every day | ORAL | 3 refills | Status: DC

## 2022-07-04 NOTE — Assessment & Plan Note (Addendum)
Chronic, 14% 10 year CVD risk.. restart statin as lifestyle Inadequate control on statin. Make sure taking atorvastatin regularly. Keep working on lowering cholesterol and sugar in diet.  Increase walking as much as able.changes ineffective.

## 2022-07-04 NOTE — Patient Instructions (Signed)
Restart Coreg 25 mg twice daily.  Follow BP at home.Marland Kitchen goal < 140/90. Hr 60-90  Decreased salt in diet, work on regular activity.  Call or MyChart BP in 1-2 weeks.

## 2022-07-04 NOTE — Assessment & Plan Note (Signed)
Chronic, recurrent Visited resolved with weight loss and lifestyle changes but now has returned potentially secondary to weight gain associated with psychotropic medications.  Also the Viibryd he is started in the last several months has associated hypertension as a side effect.  He is not willing to stop Viibryd at this time given he has $300 worth of medication at home.  Given there is no clear allergic reaction to this medication and he is just possibly having side effects, we will continue it but treat the blood pressure.  He will restart Coreg 25 mg p.o. twice daily.  He will follow blood pressure at home and call in 2 weeks with measurements.

## 2022-07-04 NOTE — Progress Notes (Signed)
Patient ID: Jeffrey Lin, male    DOB: September 22, 1975, 47 y.o.   MRN: [id number]  This visit was conducted in person.  BP (!) 146/100   Pulse (!) 102   Temp 98.3 F (36.8 C) (Temporal)   Ht 6' 4.5" (1.943 m)   Wt 261 lb 6 oz (118.6 kg)   SpO2 95%   BMI 31.40 kg/m    CC:  Chief Complaint  Patient presents with   Elevated Blood Pressure    C/o elevated BP since last OV on 06/11/22.    Subjective:   HPI: Jeffrey Lin is a 47 y.o. male presenting on 07/04/2022 for Elevated Blood Pressure (C/o elevated BP since last OV on 06/11/22.)    Hypertension:   Blood pressure has been elevated  at home in last  month.  159/108, 169/117.   Ringing in ears and pressure behind eyes.  Using medication without problems or lightheadedness: Occasional Chest pain with exertion: None Edema: Yes Short of breath: None Average home BPs: Other issues:  BP Readings from Last 3 Encounters:  07/04/22 (!) 146/100  06/19/22 (!) 154/100  06/11/22 (!) 138/97    Wt Readings from Last 3 Encounters:  07/04/22 261 lb 6 oz (118.6 kg)  06/19/22 257 lb 3.2 oz (116.7 kg)  06/11/22 257 lb 4 oz (116.7 kg)   The 10-year ASCVD risk score (Arnett DK, et al., 2019) is: 14.3%   Values used to calculate the score:     Age: 86 years     Sex: Male     Is Non-Hispanic African American: No     Diabetic: No     Tobacco smoker: Yes     Systolic Blood Pressure: 146 mmHg     Is BP treated: Yes     HDL Cholesterol: 37.7 mg/dL     Total Cholesterol: 226 mg/dL      Relevant past medical, surgical, family and social history reviewed and updated as indicated. Interim medical history since our last visit reviewed. Allergies and medications reviewed and updated. Outpatient Medications Prior to Visit  Medication Sig Dispense Refill   ALPRAZolam (XANAX) 1 MG tablet Take 1 mg by mouth 4 (four) times daily as needed.     amantadine (SYMMETREL) 100 MG capsule Take 100 mg by mouth 2 (two) times daily.      amphetamine-dextroamphetamine (ADDERALL) 20 MG tablet Take 20 mg by mouth in the morning, at noon, and at bedtime.     gabapentin (NEURONTIN) 600 MG tablet Take 1,200 mg by mouth 3 (three) times daily.      risperiDONE (RISPERDAL) 2 MG tablet Take 2 mg by mouth at bedtime.     tiZANidine (ZANAFLEX) 2 MG tablet TAKE 1 TABLET (2 MG TOTAL) BY MOUTH 3 (THREE) TIMES DAILY AS NEEDED FOR MUSCLE SPASMS. 270 tablet 0   Vilazodone HCl (VIIBRYD) 40 MG TABS Take 40 mg by mouth daily.     No facility-administered medications prior to visit.     Per HPI unless specifically indicated in ROS section below Review of Systems  Constitutional:  Negative for fatigue and fever.  HENT:  Negative for ear pain.   Eyes:  Negative for pain.  Respiratory:  Negative for cough and shortness of breath.   Cardiovascular:  Positive for leg swelling. Negative for chest pain and palpitations.  Gastrointestinal:  Negative for abdominal pain.  Genitourinary:  Negative for dysuria.  Musculoskeletal:  Negative for arthralgias.  Skin:  Positive for wound.  Neurological:  Positive for dizziness. Negative for syncope, light-headedness and headaches.  Psychiatric/Behavioral:  Negative for dysphoric mood.    Objective:  BP (!) 146/100   Pulse (!) 102   Temp 98.3 F (36.8 C) (Temporal)   Ht 6' 4.5" (1.943 m)   Wt 261 lb 6 oz (118.6 kg)   SpO2 95%   BMI 31.40 kg/m   Wt Readings from Last 3 Encounters:  07/04/22 261 lb 6 oz (118.6 kg)  06/19/22 257 lb 3.2 oz (116.7 kg)  06/11/22 257 lb 4 oz (116.7 kg)      Physical Exam Constitutional:      Appearance: He is well-developed.  HENT:     Head: Normocephalic.     Right Ear: Hearing normal.     Left Ear: Hearing normal.     Nose: Nose normal.  Neck:     Thyroid: No thyroid mass or thyromegaly.     Vascular: No carotid bruit.     Trachea: Trachea normal.  Cardiovascular:     Rate and Rhythm: Normal rate and regular rhythm.     Pulses: Normal pulses.     Heart  sounds: Heart sounds not distant. No murmur heard.    No friction rub. No gallop.     Comments: No peripheral edema Pulmonary:     Effort: Pulmonary effort is normal. No respiratory distress.     Breath sounds: Normal breath sounds.  Skin:    General: Skin is warm and dry.     Findings: No rash.     Comments: Healing left lower leg anterior wound, continued peripheral edema 1+ and chronic venous stasis dermatitis changes  Psychiatric:        Speech: Speech normal.        Behavior: Behavior normal.        Thought Content: Thought content normal.       Results for orders placed or performed in visit on 11/02/21  VITAMIN D 25 Hydroxy (Vit-D Deficiency, Fractures)  Result Value Ref Range   VITD 29.18 (L) 30.00 - 100.00 ng/mL  Comprehensive metabolic panel  Result Value Ref Range   Sodium 140 135 - 145 mEq/L   Potassium 4.3 3.5 - 5.1 mEq/L   Chloride 100 96 - 112 mEq/L   CO2 34 (H) 19 - 32 mEq/L   Glucose, Bld 107 (H) 70 - 99 mg/dL   BUN 11 6 - 23 mg/dL   Creatinine, Ser 1.61 0.40 - 1.50 mg/dL   Total Bilirubin 0.5 0.2 - 1.2 mg/dL   Alkaline Phosphatase 118 (H) 39 - 117 U/L   AST 18 0 - 37 U/L   ALT 24 0 - 53 U/L   Total Protein 7.1 6.0 - 8.3 g/dL   Albumin 4.4 3.5 - 5.2 g/dL   GFR 096.04 >54.09 mL/min   Calcium 9.8 8.4 - 10.5 mg/dL  Lipid panel  Result Value Ref Range   Cholesterol 226 (H) 0 - 200 mg/dL   Triglycerides 811.9 (H) 0.0 - 149.0 mg/dL   HDL 14.78 (L) >29.56 mg/dL   VLDL 21.3 (H) 0.0 - 08.6 mg/dL   Total CHOL/HDL Ratio 6    NonHDL 188.75   Hemoglobin A1c  Result Value Ref Range   Hgb A1c MFr Bld 6.1 4.6 - 6.5 %  LDL cholesterol, direct  Result Value Ref Range   Direct LDL 167.0 mg/dL    Assessment and Plan  Essential hypertension, benign Assessment & Plan: Chronic, recurrent Visited resolved with weight loss and lifestyle changes but  now has returned potentially secondary to weight gain associated with psychotropic medications.  Also the Viibryd he is  started in the last several months has associated hypertension as a side effect.  He is not willing to stop Viibryd at this time given he has $300 worth of medication at home.  Given there is no clear allergic reaction to this medication and he is just possibly having side effects, we will continue it but treat the blood pressure.  He will restart Coreg 25 mg p.o. twice daily.  He will follow blood pressure at home and call in 2 weeks with measurements.   HYPERCHOLESTEROLEMIA Assessment & Plan:  Chronic, 14% 10 year CVD risk.. restart statin as lifestyle Inadequate control on statin. Make sure taking atorvastatin regularly. Keep working on lowering cholesterol and sugar in diet.  Increase walking as much as able.changes ineffective.    Other orders -     Carvedilol; Take 1 tablet (25 mg total) by mouth 2 (two) times daily with a meal.  Dispense: 60 tablet; Refill: 3 -     Atorvastatin Calcium; Take 1 tablet (10 mg total) by mouth daily.  Dispense: 90 tablet; Refill: 3    No follow-ups on file.   Kerby Nora, MD

## 2022-07-10 ENCOUNTER — Other Ambulatory Visit: Payer: Self-pay | Admitting: Surgery

## 2022-07-10 ENCOUNTER — Ambulatory Visit: Payer: Medicare Other | Admitting: Surgery

## 2022-07-10 ENCOUNTER — Encounter: Payer: Self-pay | Admitting: Surgery

## 2022-07-10 VITALS — BP 129/86 | HR 73 | Temp 98.1°F | Ht 76.5 in | Wt 258.0 lb

## 2022-07-10 DIAGNOSIS — L720 Epidermal cyst: Secondary | ICD-10-CM | POA: Diagnosis not present

## 2022-07-10 NOTE — Progress Notes (Signed)
DIAGNOSIS Symptomatic soft tissue mass Right temple  PROCEDURES 1.  Excision of epidermal cyst Right temple 32 mm 2.  Intermediate closure measuring Rigth temple 32mm  ANESTHESIA: Lidocaine 1% with epinephrine 7cc  EBL: Minimal  FINDINGS: Lipoma  After informed consent was obtained the patient was prepped and draped in the usual sterile fashion.  Lidocaine 1% was injected over the area of interest.  15 blade knife used to create an incision and the subcutaneous tissue was dissected free with hemostats.  The mass was dissected free from adjacent structures using Metzenbaum scissors. The capsule ruptured while we dissected it.  Specimen was sent for permanent pathology.  Hemostasis obtained with pressure.  The wound was closed in a 2 layer fashion with dermal layer using interrupted 3-0 Vicryl.  The skin was closed in a subcuticular fashion using 4-0 Monocryl.  Dermabond was applied.  No complications. The  patient tolerated procedure well

## 2022-07-10 NOTE — Patient Instructions (Addendum)
We have removed a Cyst in our office today.  You have sutures under the skin that will dissolve and also dermabond (skin glue) on top of your skin which will come off on it's own in 10-14 days.  You may use Ibuprofen or Tylenol as needed for pain control. Use the ice pack 3-4 times a day for the next two days for any achiness.  You may shower Friday, do not scrub at the area.  Avoid Strenuous activities that will make you sweat during the next 48 hours to avoid the glue coming off prematurely. Avoid activities that will place pressure to this area of the body for 1-2 weeks to avoid re-injury to incision site.  We will call you with your pathology results.  If you have any questions or concerns prior to this appointment, call our office and speak with a nurse.    Excision of Skin Cysts or Lesions Excision of a skin lesion refers to the removal of a section of skin by making small cuts (incisions) in the skin. This procedure may be done to remove a cancerous (malignant) or noncancerous (benign) growth on the skin. It is typically done to treat or prevent cancer or infection. It may also be done to improve cosmetic appearance. The procedure may be done to remove: Cancerous growths, such as basal cell carcinoma, squamous cell carcinoma, or melanoma. Noncancerous growths, such as a cyst or lipoma. Growths, such as moles or skin tags, which may be removed for cosmetic reasons.  Various excision or surgical techniques may be used depending on your condition, the location of the lesion, and your overall health. Tell a health care provider about: Any allergies you have. All medicines you are taking, including vitamins, herbs, eye drops, creams, and over-the-counter medicines. Any problems you or family members have had with anesthetic medicines. Any blood disorders you have. Any surgeries you have had. Any medical conditions you have. Whether you are pregnant or may be pregnant. What are the  risks? Generally, this is a safe procedure. However, problems may occur, including: Bleeding. Infection. Scarring. Recurrence of the cyst, lipoma, or cancer. Changes in skin sensation or appearance, such as discoloration or swelling. Reaction to the anesthetics. Allergic reaction to surgical materials or ointments. Damage to nerves, blood vessels, muscles, or other structures. Continued pain.  What happens before the procedure? Ask your health care provider about: Changing or stopping your regular medicines. This is especially important if you are taking diabetes medicines or blood thinners. Taking medicines such as aspirin and ibuprofen. These medicines can thin your blood. Do not take these medicines before your procedure if your health care provider instructs you not to. You may be asked to take certain medicines. You may be asked to stop smoking. You may have an exam or testing. What happens during the procedure? To reduce your risk of infection: Your health care team will wash or sanitize their hands. Your skin will be washed with soap. You will be given a medicine to numb the area (local anesthetic). One of the following excision techniques will be performed. At the end of any of these procedures, antibiotic ointment will be applied as needed. Each of the following techniques may vary among health care providers and hospitals. Complete Surgical Excision The area of skin that needs to be removed will be marked with a pen. Using a small scalpel or scissors, the surgeon will gently cut around and under the lesion until it is completely removed. The lesion will be  placed in a fluid and sent to the lab for examination. If necessary, bleeding will be controlled with a device that delivers heat (electrocautery). The edges of the wound may be stitched (sutured) together, and a bandage (dressing) will be applied. This procedure may be performed to treat a cancerous growth or a noncancerous  cyst or lesion. Excision of a Cyst The surgeon will make an incision on the cyst. The entire cyst will be removed through the incision. The incision may be closed with sutures.  What happens after the procedure? Return to your normal activities as told by your health care provider.

## 2022-08-14 ENCOUNTER — Other Ambulatory Visit: Payer: Self-pay | Admitting: Family Medicine

## 2022-08-14 NOTE — Telephone Encounter (Signed)
Last office visit 07/04/2022 for HTN.  Last refilled 05/17/2022 for #[id number] with no refills.  Next Appt: CPE 11/12/2022.

## 2022-09-27 ENCOUNTER — Other Ambulatory Visit: Payer: Self-pay | Admitting: Family Medicine

## 2022-10-23 ENCOUNTER — Telehealth: Payer: Self-pay | Admitting: Family Medicine

## 2022-10-23 DIAGNOSIS — E7800 Pure hypercholesterolemia, unspecified: Secondary | ICD-10-CM

## 2022-10-23 DIAGNOSIS — E559 Vitamin D deficiency, unspecified: Secondary | ICD-10-CM

## 2022-10-23 DIAGNOSIS — R7309 Other abnormal glucose: Secondary | ICD-10-CM

## 2022-10-23 NOTE — Telephone Encounter (Signed)
-----   Message from Lovena Neighbours sent at 10/23/2022 10:23 AM EDT ----- Regarding: Labs for Tuesday 10.15.24 Please put physical lab orders in future. Thank you, Denny Peon

## 2022-11-05 ENCOUNTER — Other Ambulatory Visit (INDEPENDENT_AMBULATORY_CARE_PROVIDER_SITE_OTHER): Payer: Medicare Other

## 2022-11-05 DIAGNOSIS — E559 Vitamin D deficiency, unspecified: Secondary | ICD-10-CM

## 2022-11-05 DIAGNOSIS — R7309 Other abnormal glucose: Secondary | ICD-10-CM | POA: Diagnosis not present

## 2022-11-05 DIAGNOSIS — E7800 Pure hypercholesterolemia, unspecified: Secondary | ICD-10-CM

## 2022-11-05 LAB — LIPID PANEL
Cholesterol: 165 mg/dL (ref 0–200)
HDL: 32.6 mg/dL — ABNORMAL LOW (ref >39.00)
LDL Cholesterol: 77 mg/dL (ref 0–99)
NonHDL: 132.74
Total CHOL/HDL Ratio: 5
Triglycerides: 277 mg/dL — ABNORMAL HIGH (ref 0.0–149.0)
VLDL: 55.4 mg/dL — ABNORMAL HIGH (ref 0.0–40.0)

## 2022-11-05 LAB — COMPREHENSIVE METABOLIC PANEL
ALT: 24 U/L (ref 0–53)
AST: 15 U/L (ref 0–37)
Albumin: 4.2 g/dL (ref 3.5–5.2)
Alkaline Phosphatase: 117 U/L (ref 39–117)
BUN: 13 mg/dL (ref 6–23)
CO2: 33 meq/L — ABNORMAL HIGH (ref 19–32)
Calcium: 9.5 mg/dL (ref 8.4–10.5)
Chloride: 97 meq/L (ref 96–112)
Creatinine, Ser: 1 mg/dL (ref 0.40–1.50)
GFR: 90.3 mL/min (ref >60.00)
Glucose, Bld: 128 mg/dL — ABNORMAL HIGH (ref 70–99)
Potassium: 4.2 meq/L (ref 3.5–5.1)
Sodium: 137 meq/L (ref 135–145)
Total Bilirubin: 0.3 mg/dL (ref 0.2–1.2)
Total Protein: 6.8 g/dL (ref 6.0–8.3)

## 2022-11-05 LAB — HEMOGLOBIN A1C: Hgb A1c MFr Bld: 6.5 % (ref 4.6–6.5)

## 2022-11-05 LAB — VITAMIN D 25 HYDROXY (VIT D DEFICIENCY, FRACTURES): VITD: 18.77 ng/mL — ABNORMAL LOW (ref 30.00–100.00)

## 2022-11-05 NOTE — Progress Notes (Signed)
No critical labs need to be addressed urgently. We will discuss labs in detail at upcoming office visit.   

## 2022-11-07 ENCOUNTER — Ambulatory Visit: Payer: Medicare Other

## 2022-11-07 VITALS — Ht 77.0 in | Wt 265.0 lb

## 2022-11-07 DIAGNOSIS — Z0000 Encounter for general adult medical examination without abnormal findings: Secondary | ICD-10-CM | POA: Diagnosis not present

## 2022-11-07 NOTE — Progress Notes (Signed)
Subjective:   Jeffrey Lin is a 47 y.o. male who presents for Medicare Annual/Subsequent preventive examination.  Visit Complete: Virtual I connected with  [name] on 11/07/22 by a audio enabled telemedicine application and verified that I am speaking with the correct person using two identifiers.  Patient Location: Home  Provider Location: Home Office  I discussed the limitations of evaluation and management by telemedicine. The patient expressed understanding and agreed to proceed.  Vital Signs: Because this visit was a virtual/telehealth visit, some criteria may be missing or patient reported. Any vitals not documented were not able to be obtained and vitals that have been documented are patient reported.  Patient Medicare AWV questionnaire was completed by the patient on 11/07/2022; I have confirmed that all information answered by patient is correct and no changes since this date.  Cardiac Risk Factors include: male gender;none     Objective:    Today's Vitals   11/07/22 1306  Weight: 265 lb (120.2 kg)  Height: 6\' 5"  (1.956 m)   Body mass index is 31.42 kg/m.     11/07/2022    1:10 PM 03/29/2022    9:30 AM 04/25/2021    3:26 PM 08/24/2020   11:37 AM 06/10/2019    9:49 PM 05/28/2019   12:43 PM 05/15/2019    8:00 PM  Advanced Directives  Does Patient Have a Medical Advance Directive? No No No No No No No  Would patient like information on creating a medical advance directive? Yes (MAU/Ambulatory/Procedural Areas - Information given) No - Patient declined Yes (MAU/Ambulatory/Procedural Areas - Information given) No - Patient declined No - Patient declined  No - Patient declined    Current Medications (verified) Outpatient Encounter Medications as of 11/07/2022  Medication Sig   ALPRAZolam (XANAX) 1 MG tablet Take 1 mg by mouth 4 (four) times daily as needed.   amantadine (SYMMETREL) 100 MG capsule Take 100 mg by mouth 2 (two) times daily.    amphetamine-dextroamphetamine (ADDERALL) 20 MG tablet Take 20 mg by mouth in the morning, at noon, and at bedtime.   atorvastatin (LIPITOR) 10 MG tablet Take 1 tablet (10 mg total) by mouth daily.   carvedilol (COREG) 25 MG tablet TAKE 1 TABLET (25 MG TOTAL) BY MOUTH TWICE A DAY WITH MEALS   gabapentin (NEURONTIN) 600 MG tablet Take 1,200 mg by mouth 3 (three) times daily.    risperiDONE (RISPERDAL) 2 MG tablet Take 2 mg by mouth at bedtime.   tiZANidine (ZANAFLEX) 2 MG tablet TAKE 1 TABLET (2 MG TOTAL) BY MOUTH 3 (THREE) TIMES DAILY AS NEEDED FOR MUSCLE SPASMS.   Vilazodone HCl (VIIBRYD) 40 MG TABS Take 40 mg by mouth daily. (Patient not taking: Reported on 11/07/2022)   No facility-administered encounter medications on file as of 11/07/2022.    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   RIGHT HEART CATH AND CORONARY ANGIOGRAPHY Right 08/02/2019   Procedure: RIGHT HEART CATH AND CORONARY ANGIOGRAPHY;  Surgeon: Iran Ouch, MD;  Location: [facility] INVASIVE CV LAB;  Service: Cardiovascular;  Laterality: Right;   Sphenopalatine ganglionic     Family History  Problem Relation Age of Onset   Hyperlipidemia Mother    Hypertension Mother    Depression Mother    Anxiety disorder Mother    Lung disease Father    Depression Brother    Anxiety disorder Brother    Bipolar disorder Maternal Grandfather    Schizophrenia Maternal Grandfather    Bipolar disorder Paternal Grandfather    Social History   Socioeconomic History   Marital status: Single    Spouse name: Not on  file   Number of children: 0   Years of education: 14   Highest education level: Not on file  Occupational History   Occupation: Marketing executive    Employer: [employer]  Tobacco Use   Smoking status: Every Day    Current packs/day: 1.50    Average packs/day: 1.5 packs/day for 27.1 years (40.7 ttl pk-yrs)    Types: Cigarettes    Start date: 09/22/1995    Passive exposure: Past   Smokeless tobacco: Former   Tobacco comments:    Pt states that he is cutting back - currently at 1/2 PPD  (as of 07/27/19)  Vaping Use   Vaping status: Never Used  Substance and Sexual Activity   Alcohol use: No    Alcohol/week: 0.0 standard drinks of alcohol   Drug use: No   Sexual activity: Not Currently  Other Topics Concern   Not on file  Social History Narrative      Exercise:  None, running causes head pain   Diet:  FF once daily, + fruits and veggies, + H2O, + Soda   Lives with mom in a one story home (has 5 steps).  No children.  Currently not working - disabled.  Education: associate degree.    Right handed   One story home   Social Determinants of Health   Financial Resource Strain: Low Risk  (11/07/2022)   Overall Financial Resource Strain (CARDIA)    Difficulty of Paying Living Expenses: Not hard at all  Food Insecurity: No Food Insecurity (11/07/2022)   Hunger Vital Sign    Worried About Running Out of Food in the Last Year: Never true    Ran Out of Food in the Last Year: Never true  Transportation Needs: No Transportation Needs (11/07/2022)   PRAPARE - Administrator, Civil Service (Medical): No    Lack of Transportation (Non-Medical): No  Physical Activity: Insufficiently Active (11/07/2022)   Exercise Vital Sign    Days of Exercise per Week: 3 days    Minutes of Exercise per Session: 30 min  Stress: No Stress Concern Present (11/07/2022)   [employer] of Occupational Health - Occupational Stress Questionnaire    Feeling of Stress : Not at  all  Social Connections: Socially Isolated (11/07/2022)   Social Connection and Isolation Panel [NHANES]    Frequency of Communication with Friends and Family: More than three times a week    Frequency of Social Gatherings with Friends and Family: More than three times a week    Attends Religious Services: Never    Database administrator or Organizations: No    Attends Banker Meetings: Never    Marital Status: Never married    Tobacco Counseling Ready to quit: No Counseling given: Not Answered Tobacco comments: Pt states that he is cutting back - currently at  1/2 PPD  (as of 07/27/19)   Clinical Intake:  Pre-visit preparation completed: Yes  Pain : No/denies pain     Nutritional Risks: None Diabetes: No  How often do you need to have someone help you when you read instructions, pamphlets, or other written materials from your doctor or pharmacy?: 1 - Never  Interpreter Needed?: No  Information entered by :: Renie Ora, LPN   Activities of Daily Living    11/07/2022    1:10 PM  In your present state of health, do you have any difficulty performing the following activities:  Hearing? 0  Vision? 0  Difficulty concentrating or making decisions? 0  Walking or climbing stairs? 0  Dressing or bathing? 0  Doing errands, shopping? 0  Preparing Food and eating ? N  Using the Toilet? N  In the past six months, have you accidently leaked urine? N  Do you have problems with loss of bowel control? N  Managing your Medications? N  Managing your Finances? N  Housekeeping or managing your Housekeeping? N    Patient Care Team: Excell Seltzer, MD as PCP - General Iran Ouch, MD as PCP - Cardiology (Cardiology) Glendale Chard, DO as Consulting Physician (Neurology)  Indicate any recent Medical Services you may have received from other than [facility] providers in the past year (date may be approximate).     Assessment:   This is a routine wellness  examination for Foyil.  Hearing/Vision screen Vision Screening - Comments:: Wears rx glasses - up to date with routine eye exams with  [facility]    Goals Addressed             This Visit's Progress    DIET - EAT MORE FRUITS AND VEGETABLES         Depression Screen    11/07/2022    1:09 PM 07/04/2022   11:44 AM 05/28/2022   12:00 PM 04/25/2021    3:16 PM 08/20/2019   12:11 PM 08/11/2018    9:16 AM 03/20/2017   12:10 PM  PHQ 2/9 Scores  PHQ - 2 Score 0 0 0 1 0 1 2  PHQ- 9 Score   0    10    Fall Risk    11/07/2022    1:07 PM 07/04/2022   11:43 AM 06/05/2022    2:13 PM 05/28/2022   12:00 PM 04/25/2021    3:14 PM  Fall Risk   Falls in the past year? 0 1 1 1  0  Number falls in past yr: 0  0 0 0  Injury with Fall? 0  1 1 0  Risk for fall due to : No Fall Risks   No Fall Risks No Fall Risks  Follow up Falls prevention discussed   Falls evaluation completed Falls prevention discussed    MEDICARE RISK AT HOME: Medicare Risk at Home Any stairs in or around the home?: No If so, are there any without handrails?: No Home free of loose throw rugs in walkways, pet beds, electrical cords, etc?: Yes Adequate lighting in your home to reduce risk of falls?: Yes Life alert?: No Use of a cane, walker or w/c?: No Grab bars in the bathroom?: Yes Shower chair or bench in shower?: Yes Elevated toilet seat or a handicapped toilet?: Yes  TIMED UP AND GO:  Was the test performed?  No    Cognitive Function:    10/16/2015    8:57 AM 10/09/2015    9:19  AM 10/02/2015    8:55 AM  MMSE - Mini Mental State Exam  Orientation to time     Orientation to Place     Registration     Attention/ Calculation     Recall     Language- name 2 objects     Language- repeat     Language- follow 3 step command     Language- read & follow direction     Write a sentence     Copy design     Total score        Information is confidential and restricted. Go to Review Flowsheets to unlock data.         11/07/2022    1:10 PM  6CIT Screen  What Year? 0 points  What month? 0 points  What time? 0 points  Count back from 20 0 points  Months in reverse 0 points  Repeat phrase 0 points  Total Score 0 points    Immunizations Immunization History  Administered Date(s) Administered   Td 06/08/2009    TDAP status: Due, Education has been provided regarding the importance of this vaccine. Advised may receive this vaccine at local pharmacy or Health Dept. Aware to provide a copy of the vaccination record if obtained from local pharmacy or Health Dept. Verbalized acceptance and understanding.  Flu Vaccine status: Declined, Education has been provided regarding the importance of this vaccine but patient still declined. Advised may receive this vaccine at local pharmacy or Health Dept. Aware to provide a copy of the vaccination record if obtained from local pharmacy or Health Dept. Verbalized acceptance and understanding.  Pneumococcal vaccine status: Declined,  Education has been provided regarding the importance of this vaccine but patient still declined. Advised may receive this vaccine at local pharmacy or Health Dept. Aware to provide a copy of the vaccination record if obtained from local pharmacy or Health Dept. Verbalized acceptance and understanding.   Covid-19 vaccine status: Declined, Education has been provided regarding the importance of this vaccine but patient still declined. Advised may receive this vaccine at local pharmacy or Health Dept.or vaccine clinic. Aware to provide a copy of the vaccination record if obtained from local pharmacy or Health Dept. Verbalized acceptance and understanding.  Qualifies for Shingles Vaccine? No   Zostavax completed No   Shingrix Completed?: No.    Education has been provided regarding the importance of this vaccine. Patient has been advised to call insurance company to determine out of pocket expense if they have not yet received this  vaccine. Advised may also receive vaccine at local pharmacy or Health Dept. Verbalized acceptance and understanding.  Screening Tests Health Maintenance  Topic Date Due   DTaP/Tdap/Td (2 - Tdap) 06/09/2019   Colonoscopy  Never done   INFLUENZA VACCINE  Never done   COVID-19 Vaccine (1 - 2023-24 season) Never done   Medicare Annual Wellness (AWV)  11/07/2023   Hepatitis C Screening  Completed   HIV Screening  Completed   HPV VACCINES  Aged Out    Health Maintenance  Health Maintenance Due  Topic Date Due   DTaP/Tdap/Td (2 - Tdap) 06/09/2019   Colonoscopy  Never done   INFLUENZA VACCINE  Never done   COVID-19 Vaccine (1 - 2023-24 season) Never done    Colorectal cancer screening: Referral to GI placed has cologuard kit . Pt aware the office will call re: appt.  Lung Cancer Screening: (Low Dose CT Chest recommended if Age 9-80 years, 20 pack-year currently  smoking OR have quit w/in 15years.) does qualify.   Lung Cancer Screening Referral: declined   Additional Screening:  Hepatitis C Screening: does not qualify;   Vision Screening: Recommended annual ophthalmology exams for early detection of glaucoma and other disorders of the eye. Is the patient up to date with their annual eye exam?  Yes  Who is the provider or what is the name of the office in which the patient attends annual eye exams? [facility]  If pt is not established with a provider, would they like to be referred to a provider to establish care? No .   Dental Screening: Recommended annual dental exams for proper oral hygiene    Community Resource Referral / Chronic Care Management: CRR required this visit?  No   CCM required this visit?  No     Plan:     I have personally reviewed and noted the following in the patient's chart:   Medical and social history Use of alcohol, tobacco or illicit drugs  Current medications and supplements including opioid prescriptions. Patient is not currently taking  opioid prescriptions. Functional ability and status Nutritional status Physical activity Advanced directives List of other physicians Hospitalizations, surgeries, and ER visits in previous 12 months Vitals Screenings to include cognitive, depression, and falls Referrals and appointments  In addition, I have reviewed and discussed with patient certain preventive protocols, quality metrics, and best practice recommendations. A written personalized care plan for preventive services as well as general preventive health recommendations were provided to patient.     Lorrene Reid, LPN   81/[id number]   After Visit Summary: (MyChart) Due to this being a telephonic visit, the after visit summary with patients personalized plan was offered to patient via MyChart   Nurse Notes: none

## 2022-11-07 NOTE — Patient Instructions (Signed)
Jeffrey Lin , Thank you for taking time to come for your Medicare Wellness Visit. I appreciate your ongoing commitment to your health goals. Please review the following plan we discussed and let me know if I can assist you in the future.   Referrals/Orders/Follow-Ups/Clinician Recommendations: Aim for 30 minutes of exercise or brisk walking, 6-8 glasses of water, and 5 servings of fruits and vegetables each day.   This is a list of the screening recommended for you and due dates:  Health Maintenance  Topic Date Due   DTaP/Tdap/Td vaccine (2 - Tdap) 06/09/2019   Colon Cancer Screening  Never done   Flu Shot  Never done   COVID-19 Vaccine (1 - 2023-24 season) Never done   Medicare Annual Wellness Visit  11/07/2023   Hepatitis C Screening  Completed   HIV Screening  Completed   HPV Vaccine  Aged Out    Advanced directives: (Provided) Advance directive discussed with you today. I have provided a copy for you to complete at home and have notarized. Once this is complete, please bring a copy in to our office so we can scan it into your chart. Information on Advanced Care Planning can be found at [facility] Advance Health Care Directives Advance Health Care Directives ([email or web address]    Next Medicare Annual Wellness Visit scheduled for next year: Yes Insert preventive care Attachment reference

## 2022-11-12 ENCOUNTER — Other Ambulatory Visit: Payer: Self-pay | Admitting: Family Medicine

## 2022-11-12 ENCOUNTER — Encounter: Payer: Self-pay | Admitting: Family Medicine

## 2022-11-12 ENCOUNTER — Ambulatory Visit (INDEPENDENT_AMBULATORY_CARE_PROVIDER_SITE_OTHER): Payer: Medicare Other | Admitting: Family Medicine

## 2022-11-12 VITALS — BP 124/78 | HR 95 | Temp 98.0°F | Ht 76.0 in | Wt 275.1 lb

## 2022-11-12 DIAGNOSIS — E7800 Pure hypercholesterolemia, unspecified: Secondary | ICD-10-CM

## 2022-11-12 DIAGNOSIS — I10 Essential (primary) hypertension: Secondary | ICD-10-CM

## 2022-11-12 DIAGNOSIS — R7309 Other abnormal glucose: Secondary | ICD-10-CM

## 2022-11-12 DIAGNOSIS — I5032 Chronic diastolic (congestive) heart failure: Secondary | ICD-10-CM | POA: Diagnosis not present

## 2022-11-12 DIAGNOSIS — F251 Schizoaffective disorder, depressive type: Secondary | ICD-10-CM | POA: Diagnosis not present

## 2022-11-12 DIAGNOSIS — Z0000 Encounter for general adult medical examination without abnormal findings: Secondary | ICD-10-CM | POA: Diagnosis not present

## 2022-11-12 DIAGNOSIS — E559 Vitamin D deficiency, unspecified: Secondary | ICD-10-CM | POA: Diagnosis not present

## 2022-11-12 DIAGNOSIS — G4733 Obstructive sleep apnea (adult) (pediatric): Secondary | ICD-10-CM | POA: Insufficient documentation

## 2022-11-12 MED ORDER — VITAMIN D3 1.25 MG (50000 UT) PO CAPS: 1.0000 | ORAL_CAPSULE | ORAL | 0 refills | Status: DC

## 2022-11-12 NOTE — Assessment & Plan Note (Addendum)
Chronic, stable control.   Followed by psychiatry

## 2022-11-12 NOTE — Assessment & Plan Note (Signed)
Stable, chronic.  Continue current medication.   Coreg 37.5 mg BID

## 2022-11-12 NOTE — Assessment & Plan Note (Addendum)
Needs repletion. 12-week course of 50,000 international units weekly vitamin D3 sent to pharmacy.

## 2022-11-12 NOTE — Assessment & Plan Note (Signed)
Euvolemic in office today 

## 2022-11-12 NOTE — Telephone Encounter (Signed)
Patient is being seen today for his CPE.  Last refilled 08/15/22 for #[id number] with no refills.

## 2022-11-12 NOTE — Patient Instructions (Addendum)
Work on low carbohydrate diet.  Increase exercise and work on weight loss.  Return Cologuard when able to.  Complete vit D3  high dose 12 week course then start back at Vit D3 5,000 international units daily.

## 2022-11-12 NOTE — Assessment & Plan Note (Signed)
Chronic, LDL at goal, atorvastatin 10 mg p.o. daily.

## 2022-11-12 NOTE — Progress Notes (Signed)
Patient ID: Jeffrey Lin, male    DOB: Mar 01, 1975, 47 y.o.   MRN: [id number]  This visit was conducted in person.  BP 124/78 (BP Location: Left Arm, Patient Position: Sitting, Cuff Size: Large)   Pulse 95   Temp 98 F (36.7 C) (Temporal)   Ht 6\' 4"  (1.93 m)   Wt 275 lb 2 oz (124.8 kg)   SpO2 96%   BMI 33.49 kg/m    CC: Physical   Subjective:   HPI: Jeffrey Lin is a 47 y.o. male presenting on 11/12/2022 for  physical The patient presents for  complete physical and review of chronic health problems. He/She also has the following acute concerns today:  The patient saw a LPN or RN for medicare wellness visit. 11/12/22  Prevention and wellness was reviewed in detail. Note reviewed and important notes copied below.   Chronic diastolic HF. Heart Catheterization 08/02/2019 No evidence of significant volume overload.     Possible Sleep apnea: never had sleep test... had issue with home test.  Lower extremity lymphedema with severe chronic venous insufficiency and ulceration:  He has had significant improvement with treatments from lymphedema clinic including wraps, compression hose fitted for him and lymphedema pumps.  He is no longer requiring torsemide.     Significantly improved  Has started gaining weight back... LIKELY FROM [facility] for psych issue.. now has stopped. Wt Readings from Last 3 Encounters:  11/12/22 275 lb 2 oz (124.8 kg)  11/07/22 265 lb (120.2 kg)  07/10/22 258 lb (117 kg)    Hypertension:   Good  control in office today.  On Coreg 37.5 mg twice daily  BP Readings from Last 3 Encounters:  11/12/22 124/78  07/10/22 129/86  07/04/22 (!) 146/100  Using medication without problems or lightheadedness:  none Chest pain with exertion: none Edema: improved Short of breath: none Average home BPs: At home  130/80 Other issues:    Prediabetes worsened control Lab Results  Component Value Date   HGBA1C 6.5 11/05/2022   Elevated Cholesterol: LDL at  goal less than 100 atorvastatin 10 mg daily Lab Results  Component Value Date   CHOL 165 11/05/2022   HDL 32.60 (L) 11/05/2022   LDLCALC 77 11/05/2022   LDLDIRECT 167.0 11/02/2021   TRIG 277.0 (H) 11/05/2022   CHOLHDL 5 11/05/2022  Using medications without problems: Muscle aches:  Diet compliance:  moderate Exercise:  active outside Other complaints:   Current smoker:  working on quitting.. has decreased to 5 per day.   Schizoaffective disorder:  stable  Followed by psychiatry.. has appt coming up. Flowsheet Row Clinical Support from 11/07/2022 in [facility]  PHQ-2 Total Score 0       Vit  D def: not on supplement  Relevant past medical, surgical, family and social history reviewed and updated as indicated. Interim medical history since our last visit reviewed. Allergies and medications reviewed and updated. Outpatient Medications Prior to Visit  Medication Sig Dispense Refill   ALPRAZolam (XANAX) 1 MG tablet Take 1 mg by mouth 4 (four) times daily as needed.     amantadine (SYMMETREL) 100 MG capsule Take 100 mg by mouth 2 (two) times daily.     amphetamine-dextroamphetamine (ADDERALL) 20 MG tablet Take 20 mg by mouth in the morning, at noon, and at bedtime.     atorvastatin (LIPITOR) 10 MG tablet Take 1 tablet (10 mg total) by mouth daily. 90 tablet 3   carvedilol (COREG)  25 MG tablet TAKE 1 TABLET (25 MG TOTAL) BY MOUTH TWICE A DAY WITH MEALS 180 tablet 0   gabapentin (NEURONTIN) 600 MG tablet Take 1,200 mg by mouth 3 (three) times daily.      risperiDONE (RISPERDAL) 2 MG tablet Take 2 mg by mouth at bedtime.     tiZANidine (ZANAFLEX) 2 MG tablet TAKE 1 TABLET (2 MG TOTAL) BY MOUTH 3 (THREE) TIMES DAILY AS NEEDED FOR MUSCLE SPASMS. 270 tablet 0   Vilazodone HCl (VIIBRYD) 40 MG TABS Take 40 mg by mouth daily. (Patient not taking: Reported on 11/07/2022)     No facility-administered medications prior to visit.     Per HPI unless  specifically indicated in ROS section below Review of Systems  Constitutional:  Negative for fatigue and fever.  HENT:  Negative for ear pain.   Eyes:  Negative for pain.  Respiratory:  Negative for cough and shortness of breath.   Cardiovascular:  Negative for chest pain, palpitations and leg swelling.  Gastrointestinal:  Negative for abdominal pain.  Genitourinary:  Negative for dysuria.  Musculoskeletal:  Negative for arthralgias.  Neurological:  Negative for syncope, light-headedness and headaches.  Psychiatric/Behavioral:  Negative for dysphoric mood.    Objective:  BP 124/78 (BP Location: Left Arm, Patient Position: Sitting, Cuff Size: Large)   Pulse 95   Temp 98 F (36.7 C) (Temporal)   Ht 6\' 4"  (1.93 m)   Wt 275 lb 2 oz (124.8 kg)   SpO2 96%   BMI 33.49 kg/m   Wt Readings from Last 3 Encounters:  11/12/22 275 lb 2 oz (124.8 kg)  11/07/22 265 lb (120.2 kg)  07/10/22 258 lb (117 kg)      Physical Exam Constitutional:      General: He is not in acute distress.    Appearance: Normal appearance. He is well-developed. He is not ill-appearing or toxic-appearing.  HENT:     Head: Normocephalic and atraumatic.     Right Ear: Hearing, tympanic membrane, ear canal and external ear normal.     Left Ear: Hearing, tympanic membrane, ear canal and external ear normal.     Nose: Nose normal.     Mouth/Throat:     Pharynx: Uvula midline.  Eyes:     General: Lids are normal. Lids are everted, no foreign bodies appreciated.     Conjunctiva/sclera: Conjunctivae normal.     Pupils: Pupils are equal, round, and reactive to light.  Neck:     Thyroid: No thyroid mass or thyromegaly.     Vascular: No carotid bruit.     Trachea: Trachea and phonation normal.  Cardiovascular:     Rate and Rhythm: Normal rate and regular rhythm.     Pulses: Normal pulses.     Heart sounds: S1 normal and S2 normal. Heart sounds not distant. No murmur heard.    No friction rub. No gallop.     Comments:  No peripheral edema Pulmonary:     Effort: Pulmonary effort is normal. No respiratory distress.     Breath sounds: Normal breath sounds. No wheezing, rhonchi or rales.  Chest:     Chest wall: Tenderness present.     Comments:  Area of ttp at left costosternal  junction, no rash Abdominal:     General: Bowel sounds are normal.     Palpations: Abdomen is soft.     Tenderness: There is no abdominal tenderness. There is no guarding or rebound.     Hernia: No hernia is  present.  Musculoskeletal:     Cervical back: Normal range of motion and neck supple.     Comments: Kyphosis of neck  Lymphadenopathy:     Cervical: No cervical adenopathy.  Skin:    General: Skin is warm and dry.     Findings: No rash.  Neurological:     Mental Status: He is alert.     Cranial Nerves: No cranial nerve deficit.     Sensory: No sensory deficit.     Gait: Gait normal.     Deep Tendon Reflexes: Reflexes are normal and symmetric.  Psychiatric:        Attention and Perception: Attention normal.        Mood and Affect: Affect is flat.        Speech: Speech normal.        Behavior: Behavior normal.        Thought Content: Thought content normal.        Judgment: Judgment normal.       Results for orders placed or performed in visit on 11/05/22  Hemoglobin A1c  Result Value Ref Range   Hgb A1c MFr Bld 6.5 4.6 - 6.5 %  Comprehensive metabolic panel  Result Value Ref Range   Sodium 137 135 - 145 mEq/L   Potassium 4.2 3.5 - 5.1 mEq/L   Chloride 97 96 - 112 mEq/L   CO2 33 (H) 19 - 32 mEq/L   Glucose, Bld 128 (H) 70 - 99 mg/dL   BUN 13 6 - 23 mg/dL   Creatinine, Ser 4.09 0.40 - 1.50 mg/dL   Total Bilirubin 0.3 0.2 - 1.2 mg/dL   Alkaline Phosphatase 117 39 - 117 U/L   AST 15 0 - 37 U/L   ALT 24 0 - 53 U/L   Total Protein 6.8 6.0 - 8.3 g/dL   Albumin 4.2 3.5 - 5.2 g/dL   GFR 81.19 >14.78 mL/min   Calcium 9.5 8.4 - 10.5 mg/dL  Lipid panel  Result Value Ref Range   Cholesterol 165 0 - 200 mg/dL    Triglycerides 295.6 (H) 0.0 - 149.0 mg/dL   HDL 21.30 (L) >86.57 mg/dL   VLDL 84.6 (H) 0.0 - 96.2 mg/dL   LDL Cholesterol 77 0 - 99 mg/dL   Total CHOL/HDL Ratio 5    NonHDL 132.74   VITAMIN D 25 Hydroxy (Vit-D Deficiency, Fractures)  Result Value Ref Range   VITD 18.77 (L) 30.00 - 100.00 ng/mL     COVID 19 screen:  No recent travel or known exposure to COVID19 The patient denies respiratory symptoms of COVID 19 at this time. The importance of social distancing was discussed today.   Assessment and Plan The patient's preventative maintenance and recommended screening tests for an annual wellness exam were reviewed in full today. Brought up to date unless services declined.  Counselled on the importance of diet, exercise, and its role in overall health and mortality. The patient's FH and SH was reviewed, including their home life, tobacco status, and drug and alcohol status.   Vaccines:  Due for  td, COVID19.  Refused flu,  discussed COVID19/flu vaccine side effects and benefits. Strongly encouraged the patient to get the vaccine. Questions answered. Refused all all at this time. HIV neg: 03/2018  Colon cancer screen due... plan Cologuard..  Overdue  No family history of of prostate issues. Smoker..  5  day now.. precontempative for cessation.   Problem List Items Addressed This Visit     Chronic  diastolic heart failure (HCC)     Euvolemic in office today.      Essential hypertension, benign    Stable, chronic.  Continue current medication.   Coreg 37.5 mg BID      HYPERCHOLESTEROLEMIA    Chronic, LDL at goal, atorvastatin 10 mg p.o. daily.      PREDIABETES    Worsened control.  Today with weight gain of 20 pounds in the last 4 months at the threshold of diabetes.  Encouraged working on low carbohydrate diet and reevaluation in 3 months      Schizoaffective disorder, depressive type (HCC)    Chronic, stable control.   Followed by psychiatry      Vitamin D deficiency      Needs repletion. 12-week course of 50,000 international units weekly vitamin D3 sent to pharmacy.      Other Visit Diagnoses     Routine general medical examination at a health care facility    -  Primary         Kerby Nora, MD

## 2022-11-12 NOTE — Assessment & Plan Note (Signed)
Worsened control.  Today with weight gain of 20 pounds in the last 4 months at the threshold of diabetes.  Encouraged working on low carbohydrate diet and reevaluation in 3 months

## 2022-12-02 ENCOUNTER — Encounter: Payer: Self-pay | Admitting: Family Medicine

## 2022-12-05 ENCOUNTER — Encounter: Payer: Self-pay | Admitting: Family Medicine

## 2022-12-05 ENCOUNTER — Ambulatory Visit (INDEPENDENT_AMBULATORY_CARE_PROVIDER_SITE_OTHER): Payer: Medicare Other | Admitting: Family Medicine

## 2022-12-05 VITALS — BP 138/86 | HR 110 | Temp 97.6°F | Ht 76.0 in | Wt 280.0 lb

## 2022-12-05 DIAGNOSIS — L989 Disorder of the skin and subcutaneous tissue, unspecified: Secondary | ICD-10-CM | POA: Diagnosis not present

## 2022-12-05 DIAGNOSIS — M722 Plantar fascial fibromatosis: Secondary | ICD-10-CM | POA: Diagnosis not present

## 2022-12-05 MED ORDER — DICLOFENAC SODIUM 75 MG PO TBEC: 75.0000 mg | DELAYED_RELEASE_TABLET | Freq: Two times a day (BID) | ORAL | 0 refills | Status: DC

## 2022-12-05 NOTE — Assessment & Plan Note (Signed)
Acute, Treat with diclofenac 75 mg p.o. twice daily.  Continue and add home physical therapy exercises including ice massage. Will refer him back to Dr. Loreta Ave podiatry for possible orthotic construction and further management.

## 2022-12-05 NOTE — Progress Notes (Signed)
Patient ID: Jeffrey Lin, male    DOB: Jan 18, 1976, 47 y.o.   MRN: [id number]  This visit was conducted in person.  BP 138/86 (BP Location: Left Arm, Patient Position: Sitting, Cuff Size: Normal)   Pulse (!) 110   Temp 97.6 F (36.4 C) (Oral)   Ht 6\' 4"  (1.93 m)   Wt 280 lb (127 kg)   SpO2 97%   BMI 34.08 kg/m    CC:  Chief Complaint  Patient presents with   Leg Pain    Bilateral leg and foot pain x years. Patient states he has tried several things and nothing is helping anymore.     Subjective:   HPI: Jeffrey Lin is a 47 y.o. male  with history of peripheral neuropathy and chronic back pain presenting on 12/05/2022 for Leg Pain (Bilateral leg and foot pain x years. Patient states he has tried several things and nothing is helping anymore. )  Chun presents today with recurrence of foot,  at anterior part of heel, bilaterally. He had not had issues with this until leg swelling decreased.  Has  not tried any medication changes.   He has been doing some home PT exercises for plantar fasciitis.  It is limiting his activity.   Has history of similar pain... has seen foot MD ( Dr. Loreta Ave in past.. dx with plantar fasciitis., noted irregularity at ankles changin impact on feet.   Was not happy with Dr. Allena Katz, podiatry l in past.    Hx of lymphedema of Bialteral lower legs  Chronic low back pain  Peripheral neuropathy treated with high dose gabapentin  Hx of DVT   ABI in past nml 2021  He has also noted a skin lesion appear on his right arm after getting multiple scratches on bilateral arms in the woods.  Skin lesion appeared several months later.  No itching, no associated redness.   Relevant past medical, surgical, family and social history reviewed and updated as indicated. Interim medical history since our last visit reviewed. Allergies and medications reviewed and updated. Outpatient Medications Prior to Visit  Medication Sig Dispense Refill   ALPRAZolam (XANAX)  1 MG tablet Take 1 mg by mouth 4 (four) times daily as needed.     amantadine (SYMMETREL) 100 MG capsule Take 100 mg by mouth 2 (two) times daily.     amphetamine-dextroamphetamine (ADDERALL) 20 MG tablet Take 20 mg by mouth in the morning, at noon, and at bedtime.     atorvastatin (LIPITOR) 10 MG tablet Take 1 tablet (10 mg total) by mouth daily. 90 tablet 3   carvedilol (COREG) 25 MG tablet TAKE 1 TABLET (25 MG TOTAL) BY MOUTH TWICE A DAY WITH MEALS 180 tablet 0   Cholecalciferol (VITAMIN D3) 1.25 MG (50000 UT) CAPS Take 1 capsule (1.25 mg total) by mouth once a week. 12 capsule 0   FIBER PO Take by mouth.     gabapentin (NEURONTIN) 600 MG tablet Take 1,200 mg by mouth 3 (three) times daily.      risperiDONE (RISPERDAL) 2 MG tablet Take 2 mg by mouth at bedtime.     tiZANidine (ZANAFLEX) 2 MG tablet TAKE 1 TABLET (2 MG TOTAL) BY MOUTH 3 (THREE) TIMES DAILY AS NEEDED FOR MUSCLE SPASMS. 270 tablet 0   No facility-administered medications prior to visit.     Per HPI unless specifically indicated in ROS section below Review of Systems  Constitutional:  Negative for fatigue and fever.  HENT:  Negative  for ear pain.   Eyes:  Negative for pain.  Respiratory:  Negative for cough and shortness of breath.   Cardiovascular:  Negative for chest pain, palpitations and leg swelling.  Gastrointestinal:  Negative for abdominal pain.  Genitourinary:  Negative for dysuria.  Musculoskeletal:  Negative for arthralgias.  Neurological:  Negative for syncope, light-headedness and headaches.  Psychiatric/Behavioral:  Negative for dysphoric mood.    Objective:  BP 138/86 (BP Location: Left Arm, Patient Position: Sitting, Cuff Size: Normal)   Pulse (!) 110   Temp 97.6 F (36.4 C) (Oral)   Ht 6\' 4"  (1.93 m)   Wt 280 lb (127 kg)   SpO2 97%   BMI 34.08 kg/m   Wt Readings from Last 3 Encounters:  12/05/22 280 lb (127 kg)  11/12/22 275 lb 2 oz (124.8 kg)  11/07/22 265 lb (120.2 kg)      Physical  Exam Vitals reviewed.  Constitutional:      Appearance: He is well-developed.  HENT:     Head: Normocephalic.     Right Ear: Hearing normal.     Left Ear: Hearing normal.     Nose: Nose normal.     Mouth/Throat:     Mouth: Oropharynx is clear and moist and mucous membranes are normal.  Neck:     Thyroid: No thyroid mass or thyromegaly.     Vascular: No carotid bruit.     Trachea: Trachea normal.  Cardiovascular:     Rate and Rhythm: Normal rate and regular rhythm.     Pulses: Normal pulses.     Heart sounds: Heart sounds not distant. No murmur heard.    No friction rub. No gallop.     Comments: No peripheral edema Pulmonary:     Effort: Pulmonary effort is normal. No respiratory distress.     Breath sounds: Normal breath sounds.  Musculoskeletal:     Right foot: Decreased range of motion. Tenderness and bony tenderness present.     Left foot: Decreased range of motion. Tenderness and bony tenderness present.     Comments: Bilateral tenderness to palpation primarily at insertion of plantar fascia but also soreness over longitudinal arch and bilateral Achilles tendons Malalignment of bilateral ankles, pronation  Skin:    General: Skin is warm, dry and intact.     Findings: Lesion present. No rash.  Psychiatric:        Mood and Affect: Mood and affect normal.        Speech: Speech normal.        Behavior: Behavior normal.        Thought Content: Thought content normal.       Results for orders placed or performed in visit on 11/05/22  Hemoglobin A1c  Result Value Ref Range   Hgb A1c MFr Bld 6.5 4.6 - 6.5 %  Comprehensive metabolic panel  Result Value Ref Range   Sodium 137 135 - 145 mEq/L   Potassium 4.2 3.5 - 5.1 mEq/L   Chloride 97 96 - 112 mEq/L   CO2 33 (H) 19 - 32 mEq/L   Glucose, Bld 128 (H) 70 - 99 mg/dL   BUN 13 6 - 23 mg/dL   Creatinine, Ser 1.61 0.40 - 1.50 mg/dL   Total Bilirubin 0.3 0.2 - 1.2 mg/dL   Alkaline Phosphatase 117 39 - 117 U/L   AST 15 0 - 37  U/L   ALT 24 0 - 53 U/L   Total Protein 6.8 6.0 - 8.3 g/dL  Albumin 4.2 3.5 - 5.2 g/dL   GFR 81.19 >14.78 mL/min   Calcium 9.5 8.4 - 10.5 mg/dL  Lipid panel  Result Value Ref Range   Cholesterol 165 0 - 200 mg/dL   Triglycerides 295.6 (H) 0.0 - 149.0 mg/dL   HDL 21.30 (L) >86.57 mg/dL   VLDL 84.6 (H) 0.0 - 96.2 mg/dL   LDL Cholesterol 77 0 - 99 mg/dL   Total CHOL/HDL Ratio 5    NonHDL 132.74   VITAMIN D 25 Hydroxy (Vit-D Deficiency, Fractures)  Result Value Ref Range   VITD 18.77 (L) 30.00 - 100.00 ng/mL    Assessment and Plan  Plantar fasciitis Assessment & Plan: Acute, Treat with diclofenac 75 mg p.o. twice daily.  Continue and add home physical therapy exercises including ice massage. Will refer him back to Dr. Loreta Ave podiatry for possible orthotic construction and further management.   Skin lesion of right arm Assessment & Plan: Acute Skin lesion has warty appearance Possible verruca versus granuloma versus seborrheic keratosis, less likely squamous cell carcinoma Discussed possibly doing a shave biopsy here to identify skin lesion versus dermatology referral.  He is not bothered by the lesion much so we will send an urgent nonurgent referral to dermatology for identification and possible cryotherapy versus biopsy.  Orders: -     Ambulatory referral to Dermatology  Other orders -     Diclofenac Sodium; Take 1 tablet (75 mg total) by mouth 2 (two) times daily.  Dispense: 30 tablet; Refill: 0    No follow-ups on file.   Kerby Nora, MD

## 2022-12-05 NOTE — Patient Instructions (Addendum)
Start diclofenac 75 mg twice daily.  Start home PT.Marland Kitchen ice massage and stretching.  Call Dr. Loreta Ave for follow up... for possible orthotics etc.   I have place a non urgent dermatology referral for skin lesion in right forearm.

## 2022-12-05 NOTE — Assessment & Plan Note (Signed)
Acute Skin lesion has warty appearance Possible verruca versus granuloma versus seborrheic keratosis, less likely squamous cell carcinoma Discussed possibly doing a shave biopsy here to identify skin lesion versus dermatology referral.  He is not bothered by the lesion much so we will send an urgent nonurgent referral to dermatology for identification and possible cryotherapy versus biopsy.

## 2022-12-09 ENCOUNTER — Encounter: Payer: Self-pay | Admitting: Family Medicine

## 2022-12-09 ENCOUNTER — Encounter: Payer: Self-pay | Admitting: *Deleted

## 2022-12-11 MED ORDER — PREDNISONE 20 MG PO TABS: ORAL_TABLET | ORAL | 0 refills | Status: DC

## 2022-12-16 ENCOUNTER — Encounter: Payer: Self-pay | Admitting: Family Medicine

## 2022-12-18 ENCOUNTER — Other Ambulatory Visit: Payer: Self-pay | Admitting: Family Medicine

## 2022-12-18 NOTE — Telephone Encounter (Signed)
Last office visit 12/05/2022 for leg pain.  Last refilled 12/11/2022 for #15 with no refills.  Next Appt: 02/12/23 for prediabetes.

## 2022-12-22 ENCOUNTER — Other Ambulatory Visit: Payer: Self-pay | Admitting: Family Medicine

## 2022-12-23 ENCOUNTER — Ambulatory Visit (INDEPENDENT_AMBULATORY_CARE_PROVIDER_SITE_OTHER): Payer: Medicare Other

## 2022-12-23 ENCOUNTER — Ambulatory Visit (INDEPENDENT_AMBULATORY_CARE_PROVIDER_SITE_OTHER): Payer: Medicare Other | Admitting: Podiatry

## 2022-12-23 ENCOUNTER — Ambulatory Visit: Payer: Medicare Other

## 2022-12-23 ENCOUNTER — Encounter: Payer: Self-pay | Admitting: Podiatry

## 2022-12-23 DIAGNOSIS — Q6672 Congenital pes cavus, left foot: Secondary | ICD-10-CM

## 2022-12-23 DIAGNOSIS — M7741 Metatarsalgia, right foot: Secondary | ICD-10-CM | POA: Diagnosis not present

## 2022-12-23 DIAGNOSIS — M7742 Metatarsalgia, left foot: Secondary | ICD-10-CM | POA: Diagnosis not present

## 2022-12-23 DIAGNOSIS — M778 Other enthesopathies, not elsewhere classified: Secondary | ICD-10-CM

## 2022-12-23 DIAGNOSIS — G6289 Other specified polyneuropathies: Secondary | ICD-10-CM | POA: Diagnosis not present

## 2022-12-23 DIAGNOSIS — Q6671 Congenital pes cavus, right foot: Secondary | ICD-10-CM

## 2022-12-23 DIAGNOSIS — M109 Gout, unspecified: Secondary | ICD-10-CM | POA: Diagnosis not present

## 2022-12-23 NOTE — Progress Notes (Signed)
Patient was seen, measured / scanned for custom molded foot orthotics.  Patient will benefit from CFO's as they will help provide total contact to MLA's helping to better distribute body weight across BIL feet greater reducing plantar pressure and pain and to also encourage FF and RF alignment.  Patient was scanned items to be ordered and fit when in

## 2022-12-24 NOTE — Progress Notes (Signed)
Subjective:  Patient ID: Jeffrey Lin, male    DOB: April 27, 1975,  MRN: [id number]  Chief Complaint  Patient presents with   Foot Pain    Bilateral foot pain, has pain in dorsal top from bottom of toes to middle of foot, has pain in plantar from bottom of toe to back of heel. Feels like toes curling and very painful when moving foot toward body.   Foot Orthotics    He had seen Dr. Ardelle Anton in the past for foot orthotics.     Discussed the use of AI scribe software for clinical note transcription with the patient, who gave verbal consent to proceed.  History of Present Illness   The patient presents with bilateral foot pain, particularly in the balls of his feet, heels, and Achilles tendon. He describes the pain as a pulling sensation, and reports that his feet feel like they are cramping. The pain is severe enough to prevent him from doing yard work. He also reports that his toes feel like they are curling and that his feet feel like they are cramping. He has been diagnosed with plantar fasciitis in the past and was given orthotics, which provided temporary relief. He also has a history of lymphedema, which caused swelling in his lower legs and feet. He has neuropathy, which is worse on his right side, and he takes gabapentin three times a day for this. He also reports that his big toes have been throbbing, which he describes as feeling like his heartbeat. He has been taking prednisone, which has provided some relief, but he is concerned about the side effects as he has cataracts. He is unable to take nonsteroidal anti-inflammatory drugs.          Objective:    Physical Exam   MUSCULOSKELETAL: Curvature of the front of the foot with C shaped forefoot to midfoot relationship. High arches. Pes cavus foot type. Equinus bilateral. NEUROLOGICAL: Altered sensation with diffuse tenderness and sensitivity.       No images are attached to the encounter.    Results   RADIOLOGY Foot X-ray: No  fractures or stress fractures, metatarsus adductus deformity present      Assessment:   1. Capsulitis of foot   2. Pes cavus of both feet   3. Gout of both feet   4. Other polyneuropathy   5. Metatarsalgia of both feet      Plan:  Patient was evaluated and treated and all questions answered.  Assessment and Plan    Foot Pain, Metatarsalgia   He exhibits diffuse pain in the balls of his feet, heels, and Achilles tendon, with a possible history of plantar fasciitis. The pain, exacerbated by movement, is described as a pulling sensation, and high arches were noted on examination. We will order new orthotics and refer him to physical therapy at [facility], in addition to providing exercises for home stretching.  Neuropathy   He reports neuropathy, worse on the right side, currently managed with Gabapentin 2 capsules three times a day. We will continue Gabapentin as prescribed.  Possible Gout   He reports throbbing pain in both big toes, with recent use of prednisone providing relief. No lab work has been done to confirm the diagnosis. We will monitor for worsening symptoms and consider lab work for uric acid levels if symptoms return with swelling and pain acutely.  Venous Insufficiency and Lymphedema   He has a history of lymphedema causing swelling in the lower legs and feet. We will  continue current management.  General Health Maintenance   We will consider the potential impact of diet on gout symptoms.          No follow-ups on file.

## 2022-12-27 MED ORDER — CELECOXIB 200 MG PO CAPS
200.0000 mg | ORAL_CAPSULE | Freq: Every day | ORAL | 2 refills | Status: DC
Start: 2022-12-27 — End: 2023-01-10

## 2023-01-08 ENCOUNTER — Other Ambulatory Visit: Payer: Self-pay | Admitting: Family Medicine

## 2023-01-08 DIAGNOSIS — M79605 Pain in left leg: Secondary | ICD-10-CM

## 2023-01-10 ENCOUNTER — Encounter: Payer: Self-pay | Admitting: Family Medicine

## 2023-01-10 ENCOUNTER — Ambulatory Visit (INDEPENDENT_AMBULATORY_CARE_PROVIDER_SITE_OTHER): Payer: Medicare Other | Admitting: Family Medicine

## 2023-01-10 VITALS — BP 138/88 | HR 119 | Temp 97.9°F | Ht 76.0 in | Wt 285.2 lb

## 2023-01-10 DIAGNOSIS — M79604 Pain in right leg: Secondary | ICD-10-CM | POA: Diagnosis not present

## 2023-01-10 DIAGNOSIS — M79605 Pain in left leg: Secondary | ICD-10-CM

## 2023-01-10 DIAGNOSIS — M722 Plantar fascial fibromatosis: Secondary | ICD-10-CM

## 2023-01-10 DIAGNOSIS — M79676 Pain in unspecified toe(s): Secondary | ICD-10-CM | POA: Diagnosis not present

## 2023-01-10 DIAGNOSIS — G6289 Other specified polyneuropathies: Secondary | ICD-10-CM

## 2023-01-10 LAB — URIC ACID: Uric Acid, Serum: 5.8 mg/dL (ref 4.0–7.8)

## 2023-01-10 MED ORDER — TRAMADOL HCL 50 MG PO TABS: 50.0000 mg | ORAL_TABLET | Freq: Three times a day (TID) | ORAL | 0 refills | Status: DC | PRN

## 2023-01-10 NOTE — Patient Instructions (Addendum)
Please stop at the lab to have labs drawn.  Start home stretching for lower legs   Can use tramadol for pain.  We will move forward with referral to pain clinic.

## 2023-01-10 NOTE — Assessment & Plan Note (Signed)
Acute, ongoing for 1 to 2 months Potentially associated with subtle gait changes associated with current plantar fasciitis. Patient points to medial anterior shin and lateral lower leg below knee soreness to palpitation worse with walking up an incline. Patient also has underlying peripheral neuropathy.  Will move forward with uric acid evaluation as noted by podiatry for great toe pain. Start lower extremity physical therapy stretches for shin tightness. He is already on maximum dose gabapentin for peripheral neuropathy. I considered decreased blood flow as source of pain but patient did have normal ABIs in 2021, pulses are normal on exam in office today. No current swelling suggesting no flare of lymphedema.  Will treat pain with tramadol 50 to 100 mg p.o. 3 times daily as needed.  Referral placed for pain clinic. NSAIDs and prednisone contraindicated given eye issues per eye MD.

## 2023-01-10 NOTE — Progress Notes (Signed)
Patient ID: Jeffrey Lin, male    DOB: 11/13/1975, 47 y.o.   MRN: [id number]  This visit was conducted in person.  BP 138/88 (BP Location: Left Arm, Patient Position: Sitting, Cuff Size: Large)   Pulse (!) 119   Temp 97.9 F (36.6 C) (Temporal)   Ht 6\' 4"  (1.93 m)   Wt 285 lb 4 oz (129.4 kg)   SpO2 94%   BMI 34.72 kg/m    CC:  Chief Complaint  Patient presents with   Leg Pain    Bilateral    Subjective:   HPI: MAAZ Jeffrey Lin is a 47 y.o. male presenting on 01/10/2023 for Leg Pain (Bilateral)  History of chronic back pain and peripheral neuropathy... On gabapentin 1200 mg 3 times daily More recently followed by podiatry for plantar fasciitis.  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  Reviewed office visit note with podiatry from December 23, 2022  Diagnosed with plantar fasciitis, capsulitis Provided with orthotics. Also noted throbbing pain in both big toes, felt possibly due to gout.  Through MyChart with patient report of leg pain we have tried to treat with prednisone which helped some but not entirely.  No improvement with Celebrex 200 mg p.o. twice daily Referral placed to [facility]. Asked patient to come in today for repeat evaluation of leg pain.  He reports  continued pain in feet ( but orthotics not in yet)...   but main issue is pain in BIlateral medial lower leg and, below knee.  No recent low back pain, no change in numbness, no new weakness.  Feet are feeling cold... but says feet always have been cold since being a child.  No peripheral swelling Denies change in gait.   Pain mainly when walking on incline and when lying down at night  ABIs nml in 2021. Relevant past medical, surgical, family and social history reviewed and updated as indicated. Interim medical history since our last visit reviewed. Allergies and medications reviewed and updated. Outpatient Medications Prior to Visit  Medication Sig Dispense Refill   ALPRAZolam  (XANAX) 1 MG tablet Take 1 mg by mouth 4 (four) times daily as needed.     amantadine (SYMMETREL) 100 MG capsule Take 100 mg by mouth 2 (two) times daily.     amphetamine-dextroamphetamine (ADDERALL) 20 MG tablet Take 20 mg by mouth in the morning, at noon, and at bedtime.     atorvastatin (LIPITOR) 10 MG tablet Take 1 tablet (10 mg total) by mouth daily. 90 tablet 3   carvedilol (COREG) 25 MG tablet TAKE 1 TABLET (25 MG TOTAL) BY MOUTH TWICE A DAY WITH MEALS 180 tablet 3   Cholecalciferol (VITAMIN D3) 1.25 MG (50000 UT) CAPS Take 1 capsule (1.25 mg total) by mouth once a week. 12 capsule 0   FIBER PO Take by mouth.     gabapentin (NEURONTIN) 600 MG tablet Take 1,200 mg by mouth 3 (three) times daily.      risperiDONE (RISPERDAL) 2 MG tablet Take 2 mg by mouth at bedtime.     tiZANidine (ZANAFLEX) 2 MG tablet TAKE 1 TABLET (2 MG TOTAL) BY MOUTH 3 (THREE) TIMES DAILY AS NEEDED FOR MUSCLE SPASMS. 270 tablet 0   celecoxib (CELEBREX) 200 MG capsule Take 1 capsule (200 mg total) by mouth daily. 30 capsule 2   diclofenac (VOLTAREN) 75 MG EC tablet Take 1 tablet (75 mg total) by mouth 2 (two) times daily. 30 tablet 0   predniSONE (DELTASONE)  20 MG tablet 3 tabs by mouth daily x 3 days, then 2 tabs by mouth daily x 2 days then 1 tab by mouth daily x 2 days 15 tablet 0   No facility-administered medications prior to visit.     Per HPI unless specifically indicated in ROS section below Review of Systems Objective:  BP 138/88 (BP Location: Left Arm, Patient Position: Sitting, Cuff Size: Large)   Pulse (!) 119   Temp 97.9 F (36.6 C) (Temporal)   Ht 6\' 4"  (1.93 m)   Wt 285 lb 4 oz (129.4 kg)   SpO2 94%   BMI 34.72 kg/m   Wt Readings from Last 3 Encounters:  01/10/23 285 lb 4 oz (129.4 kg)  12/05/22 280 lb (127 kg)  11/12/22 275 lb 2 oz (124.8 kg)      Physical Exam    Results for orders placed or performed in visit on 11/05/22  Hemoglobin A1c   Collection Time: 11/05/22  7:31 AM  Result  Value Ref Range   Hgb A1c MFr Bld 6.5 4.6 - 6.5 %  Comprehensive metabolic panel   Collection Time: 11/05/22  7:31 AM  Result Value Ref Range   Sodium 137 135 - 145 mEq/L   Potassium 4.2 3.5 - 5.1 mEq/L   Chloride 97 96 - 112 mEq/L   CO2 33 (H) 19 - 32 mEq/L   Glucose, Bld 128 (H) 70 - 99 mg/dL   BUN 13 6 - 23 mg/dL   Creatinine, Ser 4.74 0.40 - 1.50 mg/dL   Total Bilirubin 0.3 0.2 - 1.2 mg/dL   Alkaline Phosphatase 117 39 - 117 U/L   AST 15 0 - 37 U/L   ALT 24 0 - 53 U/L   Total Protein 6.8 6.0 - 8.3 g/dL   Albumin 4.2 3.5 - 5.2 g/dL   GFR 25.95 >63.87 mL/min   Calcium 9.5 8.4 - 10.5 mg/dL  Lipid panel   Collection Time: 11/05/22  7:31 AM  Result Value Ref Range   Cholesterol 165 0 - 200 mg/dL   Triglycerides 564.3 (H) 0.0 - 149.0 mg/dL   HDL 32.95 (L) >18.84 mg/dL   VLDL 16.6 (H) 0.0 - 06.3 mg/dL   LDL Cholesterol 77 0 - 99 mg/dL   Total CHOL/HDL Ratio 5    NonHDL 132.74   VITAMIN D 25 Hydroxy (Vit-D Deficiency, Fractures)   Collection Time: 11/05/22  7:31 AM  Result Value Ref Range   VITD 18.77 (L) 30.00 - 100.00 ng/mL    Assessment and Plan  Bilateral leg pain Assessment & Plan: Acute, ongoing for 1 to 2 months Potentially associated with subtle gait changes associated with current plantar fasciitis. Patient points to medial anterior shin and lateral lower leg below knee soreness to palpitation worse with walking up an incline. Patient also has underlying peripheral neuropathy.  Will move forward with uric acid evaluation as noted by podiatry for great toe pain. Start lower extremity physical therapy stretches for shin tightness. He is already on maximum dose gabapentin for peripheral neuropathy. I considered decreased blood flow as source of pain but patient did have normal ABIs in 2021, pulses are normal on exam in office today. No current swelling suggesting no flare of lymphedema.  Will treat pain with tramadol 50 to 100 mg p.o. 3 times daily as needed.   Referral placed for pain clinic. NSAIDs and prednisone contraindicated given eye issues per eye MD.    Pain of great toe, unspecified laterality -  Uric acid  Plantar fasciitis  Other polyneuropathy  Other orders -     traMADol HCl; Take 1-2 tablets (50-100 mg total) by mouth every 8 (eight) hours as needed for up to 5 days.  Dispense: 30 tablet; Refill: 0    No follow-ups on file.   Kerby Nora, MD

## 2023-01-13 ENCOUNTER — Emergency Department: Payer: Medicare Other

## 2023-01-13 ENCOUNTER — Inpatient Hospital Stay: Payer: Medicare Other

## 2023-01-13 ENCOUNTER — Inpatient Hospital Stay
Admission: EM | Admit: 2023-01-13 | Discharge: 2023-01-17 | DRG: 871 | Discharge status: Against Medical Advice | Payer: Medicare Other | Attending: Pulmonary Disease | Admitting: Pulmonary Disease

## 2023-01-13 ENCOUNTER — Other Ambulatory Visit: Payer: Self-pay

## 2023-01-13 ENCOUNTER — Telehealth: Payer: Self-pay

## 2023-01-13 DIAGNOSIS — R000 Tachycardia, unspecified: Secondary | ICD-10-CM | POA: Diagnosis not present

## 2023-01-13 DIAGNOSIS — N179 Acute kidney failure, unspecified: Secondary | ICD-10-CM | POA: Diagnosis present

## 2023-01-13 DIAGNOSIS — Z8249 Family history of ischemic heart disease and other diseases of the circulatory system: Secondary | ICD-10-CM

## 2023-01-13 DIAGNOSIS — R778 Other specified abnormalities of plasma proteins: Secondary | ICD-10-CM | POA: Diagnosis not present

## 2023-01-13 DIAGNOSIS — R918 Other nonspecific abnormal finding of lung field: Secondary | ICD-10-CM | POA: Diagnosis not present

## 2023-01-13 DIAGNOSIS — F17210 Nicotine dependence, cigarettes, uncomplicated: Secondary | ICD-10-CM | POA: Diagnosis present

## 2023-01-13 DIAGNOSIS — I110 Hypertensive heart disease with heart failure: Secondary | ICD-10-CM | POA: Diagnosis present

## 2023-01-13 DIAGNOSIS — J9601 Acute respiratory failure with hypoxia: Secondary | ICD-10-CM | POA: Diagnosis present

## 2023-01-13 DIAGNOSIS — T17908A Unspecified foreign body in respiratory tract, part unspecified causing other injury, initial encounter: Secondary | ICD-10-CM

## 2023-01-13 DIAGNOSIS — I5032 Chronic diastolic (congestive) heart failure: Secondary | ICD-10-CM | POA: Diagnosis present

## 2023-01-13 DIAGNOSIS — J15212 Pneumonia due to Methicillin resistant Staphylococcus aureus: Secondary | ICD-10-CM | POA: Diagnosis present

## 2023-01-13 DIAGNOSIS — A419 Sepsis, unspecified organism: Principal | ICD-10-CM | POA: Diagnosis present

## 2023-01-13 DIAGNOSIS — G8929 Other chronic pain: Secondary | ICD-10-CM | POA: Diagnosis present

## 2023-01-13 DIAGNOSIS — J9602 Acute respiratory failure with hypercapnia: Secondary | ICD-10-CM | POA: Diagnosis present

## 2023-01-13 DIAGNOSIS — K759 Inflammatory liver disease, unspecified: Secondary | ICD-10-CM | POA: Diagnosis present

## 2023-01-13 DIAGNOSIS — Y92009 Unspecified place in unspecified non-institutional (private) residence as the place of occurrence of the external cause: Secondary | ICD-10-CM

## 2023-01-13 DIAGNOSIS — R0902 Hypoxemia: Secondary | ICD-10-CM | POA: Diagnosis not present

## 2023-01-13 DIAGNOSIS — R4182 Altered mental status, unspecified: Principal | ICD-10-CM

## 2023-01-13 DIAGNOSIS — R0989 Other specified symptoms and signs involving the circulatory and respiratory systems: Secondary | ICD-10-CM | POA: Diagnosis not present

## 2023-01-13 DIAGNOSIS — Z79899 Other long term (current) drug therapy: Secondary | ICD-10-CM

## 2023-01-13 DIAGNOSIS — R55 Syncope and collapse: Secondary | ICD-10-CM | POA: Diagnosis not present

## 2023-01-13 DIAGNOSIS — J90 Pleural effusion, not elsewhere classified: Secondary | ICD-10-CM | POA: Diagnosis not present

## 2023-01-13 DIAGNOSIS — I4510 Unspecified right bundle-branch block: Secondary | ICD-10-CM | POA: Diagnosis present

## 2023-01-13 DIAGNOSIS — J189 Pneumonia, unspecified organism: Secondary | ICD-10-CM | POA: Diagnosis present

## 2023-01-13 DIAGNOSIS — E669 Obesity, unspecified: Secondary | ICD-10-CM | POA: Diagnosis present

## 2023-01-13 DIAGNOSIS — T402X1A Poisoning by other opioids, accidental (unintentional), initial encounter: Secondary | ICD-10-CM | POA: Diagnosis present

## 2023-01-13 DIAGNOSIS — J9600 Acute respiratory failure, unspecified whether with hypoxia or hypercapnia: Secondary | ICD-10-CM | POA: Diagnosis not present

## 2023-01-13 DIAGNOSIS — J690 Pneumonitis due to inhalation of food and vomit: Secondary | ICD-10-CM | POA: Diagnosis present

## 2023-01-13 DIAGNOSIS — I2489 Other forms of acute ischemic heart disease: Secondary | ICD-10-CM | POA: Diagnosis present

## 2023-01-13 DIAGNOSIS — Z1152 Encounter for screening for COVID-19: Secondary | ICD-10-CM | POA: Diagnosis not present

## 2023-01-13 DIAGNOSIS — R0602 Shortness of breath: Secondary | ICD-10-CM | POA: Diagnosis not present

## 2023-01-13 DIAGNOSIS — R7989 Other specified abnormal findings of blood chemistry: Secondary | ICD-10-CM | POA: Diagnosis not present

## 2023-01-13 DIAGNOSIS — Z83438 Family history of other disorder of lipoprotein metabolism and other lipidemia: Secondary | ICD-10-CM

## 2023-01-13 DIAGNOSIS — F411 Generalized anxiety disorder: Secondary | ICD-10-CM | POA: Diagnosis present

## 2023-01-13 DIAGNOSIS — R404 Transient alteration of awareness: Secondary | ICD-10-CM | POA: Diagnosis not present

## 2023-01-13 DIAGNOSIS — T50904A Poisoning by unspecified drugs, medicaments and biological substances, undetermined, initial encounter: Secondary | ICD-10-CM | POA: Diagnosis not present

## 2023-01-13 DIAGNOSIS — R6521 Severe sepsis with septic shock: Secondary | ICD-10-CM | POA: Diagnosis present

## 2023-01-13 DIAGNOSIS — J440 Chronic obstructive pulmonary disease with acute lower respiratory infection: Secondary | ICD-10-CM | POA: Diagnosis present

## 2023-01-13 DIAGNOSIS — F251 Schizoaffective disorder, depressive type: Secondary | ICD-10-CM | POA: Diagnosis present

## 2023-01-13 DIAGNOSIS — Z6830 Body mass index (BMI) 30.0-30.9, adult: Secondary | ICD-10-CM

## 2023-01-13 DIAGNOSIS — G9341 Metabolic encephalopathy: Secondary | ICD-10-CM | POA: Diagnosis present

## 2023-01-13 DIAGNOSIS — K8020 Calculus of gallbladder without cholecystitis without obstruction: Secondary | ICD-10-CM | POA: Diagnosis not present

## 2023-01-13 DIAGNOSIS — Z836 Family history of other diseases of the respiratory system: Secondary | ICD-10-CM

## 2023-01-13 DIAGNOSIS — Z818 Family history of other mental and behavioral disorders: Secondary | ICD-10-CM

## 2023-01-13 DIAGNOSIS — J441 Chronic obstructive pulmonary disease with (acute) exacerbation: Secondary | ICD-10-CM | POA: Diagnosis present

## 2023-01-13 DIAGNOSIS — Z888 Allergy status to other drugs, medicaments and biological substances status: Secondary | ICD-10-CM

## 2023-01-13 DIAGNOSIS — Z5329 Procedure and treatment not carried out because of patient's decision for other reasons: Secondary | ICD-10-CM | POA: Diagnosis present

## 2023-01-13 DIAGNOSIS — J9611 Chronic respiratory failure with hypoxia: Secondary | ICD-10-CM

## 2023-01-13 DIAGNOSIS — J9811 Atelectasis: Secondary | ICD-10-CM | POA: Diagnosis not present

## 2023-01-13 DIAGNOSIS — Z7982 Long term (current) use of aspirin: Secondary | ICD-10-CM

## 2023-01-13 DIAGNOSIS — R9389 Abnormal findings on diagnostic imaging of other specified body structures: Secondary | ICD-10-CM | POA: Diagnosis not present

## 2023-01-13 HISTORY — DX: Chronic diastolic (congestive) heart failure: I50.32

## 2023-01-13 HISTORY — DX: Essential (primary) hypertension: I10

## 2023-01-13 HISTORY — DX: Morbid (severe) obesity due to excess calories: E66.01

## 2023-01-13 HISTORY — DX: Tobacco use: Z72.0

## 2023-01-13 HISTORY — DX: Cellulitis of unspecified part of limb: L03.119

## 2023-01-13 LAB — RESP PANEL BY RT-PCR (RSV, FLU A&B, COVID)  RVPGX2
Influenza A by PCR: NEGATIVE
Influenza B by PCR: NEGATIVE
Resp Syncytial Virus by PCR: NEGATIVE
SARS Coronavirus 2 by RT PCR: NEGATIVE

## 2023-01-13 LAB — ETHANOL: Alcohol, Ethyl (B): <10 mg/dL (ref <10)

## 2023-01-13 LAB — COMPREHENSIVE METABOLIC PANEL
ALT: 202 U/L — ABNORMAL HIGH (ref 0–44)
AST: 176 U/L — ABNORMAL HIGH (ref 15–41)
Albumin: 3.9 g/dL (ref 3.5–5.0)
Alkaline Phosphatase: 111 U/L (ref 38–126)
Anion gap: 15 (ref 5–15)
BUN: 19 mg/dL (ref 6–20)
CO2: 28 mmol/L (ref 22–32)
Calcium: 9.4 mg/dL (ref 8.9–10.3)
Chloride: 94 mmol/L — ABNORMAL LOW (ref 98–111)
Creatinine, Ser: 2.61 mg/dL — ABNORMAL HIGH (ref 0.61–1.24)
GFR, Estimated: 30 mL/min — ABNORMAL LOW (ref >60)
Glucose, Bld: 145 mg/dL — ABNORMAL HIGH (ref 70–99)
Potassium: 4.1 mmol/L (ref 3.5–5.1)
Sodium: 137 mmol/L (ref 135–145)
Total Bilirubin: 0.8 mg/dL (ref <1.2)
Total Protein: 7.8 g/dL (ref 6.5–8.1)

## 2023-01-13 LAB — PROTIME-INR
INR: 1.2 (ref 0.8–1.2)
Prothrombin Time: 15.6 s — ABNORMAL HIGH (ref 11.4–15.2)

## 2023-01-13 LAB — CBC
HCT: 53.5 % — ABNORMAL HIGH (ref 39.0–52.0)
Hemoglobin: 17.8 g/dL — ABNORMAL HIGH (ref 13.0–17.0)
MCH: 32.3 pg (ref 26.0–34.0)
MCHC: 33.3 g/dL (ref 30.0–36.0)
MCV: 97.1 fL (ref 80.0–100.0)
Platelets: 207 10*3/uL (ref 150–400)
RBC: 5.51 MIL/uL (ref 4.22–5.81)
RDW: 13 % (ref 11.5–15.5)
WBC: 17.6 10*3/uL — ABNORMAL HIGH (ref 4.0–10.5)
nRBC: 0 % (ref 0.0–0.2)

## 2023-01-13 LAB — ACETAMINOPHEN LEVEL: Acetaminophen (Tylenol), Serum: <10 ug/mL — ABNORMAL LOW (ref 10–30)

## 2023-01-13 LAB — LACTIC ACID, PLASMA
Lactic Acid, Venous: 2.7 mmol/L (ref 0.5–1.9)
Lactic Acid, Venous: 2.2 mmol/L (ref 0.5–1.9)

## 2023-01-13 LAB — TROPONIN I (HIGH SENSITIVITY)
Troponin I (High Sensitivity): 531 ng/L (ref <18)
Troponin I (High Sensitivity): 886 ng/L (ref <18)
Troponin I (High Sensitivity): 1433 ng/L (ref <18)
Troponin I (High Sensitivity): 1738 ng/L (ref <18)

## 2023-01-13 LAB — APTT: aPTT: 28 s (ref 24–36)

## 2023-01-13 LAB — PROCALCITONIN: Procalcitonin: 0.46 ng/mL

## 2023-01-13 LAB — SALICYLATE LEVEL: Salicylate Lvl: <7 mg/dL — ABNORMAL LOW (ref 7.0–30.0)

## 2023-01-13 LAB — BLOOD GAS, VENOUS

## 2023-01-13 LAB — CK: Total CK: 162 U/L (ref 49–397)

## 2023-01-13 LAB — BRAIN NATRIURETIC PEPTIDE: B Natriuretic Peptide: 128.2 pg/mL — ABNORMAL HIGH (ref 0.0–100.0)

## 2023-01-13 MED ORDER — POLYETHYLENE GLYCOL 3350 17 G PO PACK: 17.0000 g | PACK | Freq: Every day | ORAL | Status: DC | PRN

## 2023-01-13 MED ORDER — SODIUM CHLORIDE 0.9 % IV BOLUS
500.0000 mL | Freq: Once | INTRAVENOUS | Status: AC
Start: 2023-01-13 — End: 2023-01-13
  Administered 2023-01-13: 500 mL via INTRAVENOUS

## 2023-01-13 MED ORDER — METHYLPREDNISOLONE SODIUM SUCC 40 MG IJ SOLR
40.0000 mg | Freq: Every day | INTRAMUSCULAR | Status: DC
  Administered 2023-01-13 – 2023-01-16 (×4): 40 mg via INTRAVENOUS
  Filled 2023-01-13 (×4): qty 1

## 2023-01-13 MED ORDER — DOCUSATE SODIUM 100 MG PO CAPS: 100.0000 mg | ORAL_CAPSULE | Freq: Two times a day (BID) | ORAL | Status: DC | PRN

## 2023-01-13 MED ORDER — SODIUM CHLORIDE 0.9 % IV SOLN
3.0000 g | Freq: Four times a day (QID) | INTRAVENOUS | Status: DC
  Administered 2023-01-13 – 2023-01-14 (×3): 3 g via INTRAVENOUS
  Filled 2023-01-13 (×5): qty 8

## 2023-01-13 MED ORDER — NALOXONE HCL 2 MG/2ML IJ SOSY
2.0000 mg | PREFILLED_SYRINGE | Freq: Once | INTRAMUSCULAR | Status: AC
  Administered 2023-01-13: 2 mg via INTRAVENOUS

## 2023-01-13 MED ORDER — HEPARIN SODIUM (PORCINE) 5000 UNIT/ML IJ SOLN: 5000.0000 [IU] | Freq: Three times a day (TID) | INTRAMUSCULAR | Status: DC

## 2023-01-13 MED ORDER — HEPARIN (PORCINE) 25000 UT/250ML-% IV SOLN
1800.0000 [IU]/h | INTRAVENOUS | Status: DC
Start: 2023-01-13 — End: 2023-01-15
  Administered 2023-01-13: 1400 [IU]/h via INTRAVENOUS
  Administered 2023-01-14: 1600 [IU]/h via INTRAVENOUS
  Filled 2023-01-13 (×3): qty 250

## 2023-01-13 MED ORDER — HEPARIN BOLUS VIA INFUSION
4000.0000 [IU] | Freq: Once | INTRAVENOUS | Status: AC
Start: 2023-01-13 — End: 2023-01-13
  Administered 2023-01-13: 4000 [IU] via INTRAVENOUS
  Filled 2023-01-13: qty 4000

## 2023-01-13 MED ORDER — IPRATROPIUM-ALBUTEROL 0.5-2.5 (3) MG/3ML IN SOLN
3.0000 mL | RESPIRATORY_TRACT | Status: DC
  Administered 2023-01-13 – 2023-01-16 (×15): 3 mL via RESPIRATORY_TRACT
  Filled 2023-01-13 (×15): qty 3

## 2023-01-13 MED ORDER — SODIUM CHLORIDE 0.9 % IV SOLN: INTRAVENOUS | Status: DC

## 2023-01-13 MED ORDER — SODIUM CHLORIDE 0.9 % IV SOLN
2.0000 g | Freq: Once | INTRAVENOUS | Status: AC
  Administered 2023-01-13: 2 g via INTRAVENOUS
  Filled 2023-01-13: qty 20

## 2023-01-13 NOTE — ED Triage Notes (Signed)
Pt arrives via [facility] from home for possible overdose. Per EMS, mom patient unresponsive in his bed this morning. Fire dept gave 2mg  Narcan intra-nasal. Pt arrived on NRB, able to maintain airway. Pt alert but speech is unclear. EMS reports they did not see any prescribed opiates in his medications. Audible rhonchi with lung sounds.

## 2023-01-13 NOTE — ED Notes (Signed)
US at bedside

## 2023-01-13 NOTE — Consult Note (Signed)
PHARMACY - ANTICOAGULATION CONSULT NOTE  Pharmacy Consult for IV Heparin Indication: chest pain/ACS  Patient Measurements:   Heparin Dosing Weight: 115 kg  Labs: Recent Labs    01/13/23 1236 01/13/23 1438 01/13/23 1654  HGB 17.8*  --   --   HCT 53.5*  --   --   PLT 207  --   --   CREATININE 2.61*  --   --   CKTOTAL 162  --   --   TROPONINIHS 531* 886* 1,433*    Estimated Creatinine Clearance: 51.9 mL/min (A) (by C-G formula based on SCr of 2.61 mg/dL (H)).  Medical History: Past Medical History:  Diagnosis Date   Anxiety    Chronic leg pain    Depression    Essential hypertension, benign 10/20/2015   Schizoaffective disorder (HCC)    Medications:  No anticoagulation prior to admission per my chart review  Assessment: 47 y/o M with medical history as above presenting with acute respiratory secondary to presumed drug overdose / aspiration event. Troponin elevated and trending up concerning for acute coronary syndrome. Pharmacy consulted to initiate and manage heparin infusion.  Baseline aPTT and INR are pending. Baseline CBC notable for polycythemia / elevated HCT.  Goal of Therapy:  Heparin level 0.3-0.7 units/ml Monitor platelets by anticoagulation protocol: Yes   Plan:  --Heparin 4000 unit IV bolus followed by continuous infusion at 1400 units/hr --Heparin level 6 hours from initiation of infusion --Daily CBC per protocol while on IV heparin  Jeffrey Lin 01/13/2023,5:48 PM

## 2023-01-13 NOTE — Telephone Encounter (Addendum)
Mrs. Bir (mom) notified by telephone that Dr. Ermalene Searing did not prescribed [facility].  She has only recently prescribed Tramadol for his pain.

## 2023-01-13 NOTE — Consult Note (Signed)
PHARMACY CONSULT NOTE - ELECTROLYTES  Pharmacy Consult for Electrolyte Monitoring and Replacement   Recent Labs: Potassium (mmol/L)  Date Value  01/13/2023 4.1  09/11/2013 3.6   Magnesium (mg/dL)  Date Value  57/84/6962 1.7  09/11/2013 2.1   Calcium (mg/dL)  Date Value  95/28/4132 9.4   Calcium, Total (mg/dL)  Date Value  44/01/270 8.0 (L)   Albumin (g/dL)  Date Value  53/66/4403 3.9  09/09/2013 4.6   Albumin Serum (g/dL)  Date Value  47/42/5956 4.5   Phosphorus (mg/dL)  Date Value  38/75/6433 3.9   Sodium (mmol/L)  Date Value  01/13/2023 137  07/30/2019 140  09/11/2013 145     Estimated Creatinine Clearance: 51.9 mL/min (A) (by C-G formula based on SCr of 2.61 mg/dL (H)).  Assessment  Jeffrey Lin is a 47 y.o. male presenting with drug overdose. PMH significant for anxiety, chronic pain, schizoaffective disorder, hypertension . Pharmacy has been consulted to monitor and replace electrolytes.  Diet: NPO, on BiPAP MIVF: N/A Pertinent medications: N/A  Goal of Therapy: Electrolytes within normal limits  Plan:  No electrolyte replacement indicated at this time Follow-up electrolytes with AM labs tomorrow  Thank you for allowing pharmacy to be a part of this patient's care.  [name] 01/13/2023 2:27 PM

## 2023-01-13 NOTE — ED Provider Notes (Signed)
[facility] Provider Note    Event Date/Time   First MD Initiated Contact with Patient 01/13/23 1242     (approximate)  History   Chief Complaint: Drug Overdose  HPI  Jeffrey Lin is a 47 y.o. male with a past medical history of anxiety, chronic pain, schizoaffective disorder, hypertension, presents to the emergency department for unresponsiveness.  According to EMS they were called out to the patient's residence for unresponsiveness called by the patient's mother.  Per report mother last saw him around midnight last night and no one had seen him today she went to check on him and found him unresponsive in his bed with noisy respirations.  EMS was called and they state the patient was unresponsive to stimuli requiring bagging to increase the patient's O2 saturation into the 70s/80s.  They gave the patient 2 mg of intranasal Narcan and transported him to the emergency department.  Upon arrival here patient is somnolent but will awaken to voice will answer questions and falls back asleep.  Most of his responses are mumbling type responses.  Patient has audible rhonchi currently satting in the 80s on a nonrebreather mask at 15 L O2.  Patient unable to contribute to his history currently.  Physical Exam   Triage Vital Signs: ED Triage Vitals  Encounter Vitals Group     BP 01/13/23 1234 99/80     Systolic BP Percentile --      Diastolic BP Percentile --      Pulse Rate 01/13/23 1231 (!) 128     Resp 01/13/23 1231 (!) 30     Temp 01/13/23 1231 98.4 F (36.9 C)     Temp Source 01/13/23 1231 Oral     SpO2 01/13/23 1231 (!) 76 %     Weight --      Height --      Head Circumference --      Peak Flow --      Pain Score --      Pain Loc --      Pain Education --      Exclude from Growth Chart --     Most recent vital signs: Vitals:   01/13/23 1239 01/13/23 1249  BP:    Pulse:    Resp:    Temp:    SpO2: (!) 87% 91%    General: Somnolent will awaken to  voice attempts to answer questions but largely unintelligible speech.  1 mm pupils bilaterally. CV:  Good peripheral perfusion.  Regular rhythm rate around 120 bpm. Resp:  Mild tachypnea.  Mild to moderate rhonchi bilaterally. Abd:  No distention.  Soft, nontender.  No rebound or guarding.  ED Results / Procedures / Treatments   EKG  EKG viewed and interpreted by myself shows sinus tachycardia 125 bpm with a narrow QRS, normal axis, normal intervals nonspecific ST changes.  RADIOLOGY  I have reviewed and interpreted chest x-ray images.  No obvious consolidation although the patient does appear to have fairly hazy appearance bilaterally.   MEDICATIONS ORDERED IN ED: Medications  sodium chloride 0.9 % bolus 500 mL (has no administration in time range)     IMPRESSION / MDM / ASSESSMENT AND PLAN / ED COURSE  I reviewed the triage vital signs and the nursing notes.  Patient's presentation is most consistent with acute presentation with potential threat to life or bodily function.  Patient presents to the emergency department after being found unresponsive by his mother.  Last seen well  at midnight last night.  Here patient is very somnolent but will awaken to voice will attempt to answer questions but largely unintelligible speech.  Patient has audible rhonchi bilaterally found to be tachycardic with borderline hypotension.  Patient given 2 mg of Narcan IV in the emergency department several minutes later patient is awake he is able to answer questions more appropriately.  Admits to taking 10 Percocet although timing is unclear.  Unclear intent.  We will check labs including acetaminophen levels as well as LFTs.  Will obtain a CK level we will obtain a chest x-ray given the patient's hypoxia and rhonchi and possible prolonged episode of unresponsiveness.  We will continue to closely monitor.  Patient continues to sat in the 80s on a nonrebreather we will place the patient on BiPAP to monitor  for improvement.   Patient remains on BiPAP satting in the mid upper 90s now.  Patient's VBG shows an elevated pCO2 of 71.  White blood cell count is elevated to 17,000.  Given the concern for possible aspiration with an elevated white blood cell count and continued hypoxia requiring BiPAP we will cover with ceftriaxone for aspiration pneumonia.  Patient's LFTs are mildly elevated creatinine is elevated as well 2.6.  Patient's acetaminophen level is negative.  Concerned that the patient possibly was unconscious/unresponsive for significant amount of time overnight possibly aspirating.  BNP is mildly elevated 128.  Remains borderline hypotensive around 100 systolic.  Patient's troponin is elevated to 531.  Given the patient's multiple issues we will admit to the intensive care unit for ongoing workup and management.  I spoke to the intensivist he is agreeable to this plan as well.  He will be adding additional orders.    CRITICAL CARE Performed by: Minna Antis   Total critical care time: 45 minutes  Critical care time was exclusive of separately billable procedures and treating other patients.  Critical care was necessary to treat or prevent imminent or life-threatening deterioration.  Critical care was time spent personally by me on the following activities: development of treatment plan with patient and/or surrogate as well as nursing, discussions with consultants, evaluation of patient's response to treatment, examination of patient, obtaining history from patient or surrogate, ordering and performing treatments and interventions, ordering and review of laboratory studies, ordering and review of radiographic studies, pulse oximetry and re-evaluation of patient's condition.   FINAL CLINICAL IMPRESSION(S) / ED DIAGNOSES   Overdose Unresponsive Hypoxia Elevated troponin Acute renal insufficiency  Note:  This document was prepared using Dragon voice recognition software and may  include unintentional dictation errors.   Minna Antis, MD 01/13/23 1426

## 2023-01-13 NOTE — Telephone Encounter (Signed)
No, I did not prescribe Percocet.  Only pain medication recently prescribed by myself was tramadol.

## 2023-01-13 NOTE — Progress Notes (Signed)
Pharmacy Antibiotic Note  Jeffrey Lin is a 47 y.o. male w/ PMH of  PMH significant for anxiety, chronic pain, schizoaffective disorder, hypertension admitted on 01/13/2023 with a drug overdose.  Pharmacy has been consulted for Unasyn dosing ISO suspected aspiration PNA.  Plan: start Unasyn 3 grams IV every 6 hours ---follow renal function for needed dose adjustments    Temp (24hrs), Avg:98.4 F (36.9 C), Min:98.4 F (36.9 C), Max:98.4 F (36.9 C)  Recent Labs  Lab 01/13/23 1236  WBC 17.6*  CREATININE 2.61*    Estimated Creatinine Clearance: 51.9 mL/min (A) (by C-G formula based on SCr of 2.61 mg/dL (H)).    Allergies  Allergen Reactions   Toradol [Ketorolac Tromethamine] Swelling   Lamotrigine Rash    Antimicrobials this admission: 12/23 Unasyn >>   Microbiology results: 12/23 BCx: pending  Thank you for allowing pharmacy to be a part of this patient's care.  Lowella Bandy 01/13/2023 2:33 PM

## 2023-01-13 NOTE — Telephone Encounter (Signed)
Copied from CRM [id number]. Topic: Clinical - Medication Question >> Jan 13, 2023  1:31 PM Larwance Sachs wrote: Reason for CRM: Patients representative called in regarding the patient currently being in the hospital due to an overdose on percocet's. Patient representative would like to confirm if percocet are a prescribed medication for patient from primary care doctor . Please call back at [phone] and go over medications list

## 2023-01-13 NOTE — H&P (Cosign Needed Addendum)
NAME:  Jeffrey Lin, MRN:  [id number], DOB:  07/21/1975, LOS: 0 ADMISSION DATE:  01/13/2023, CONSULTATION DATE:  01/13/2023 REFERRING MD:  Dr. Lenard Lance, CHIEF COMPLAINT:  Drug overdose   Brief Pt Description / Synopsis:  47 y.o. male admitted with Acute Metabolic Encephalopathy and Acute Hypoxic and Hypercapnic Respiratory Failure in the setting of presumed opiate overdose (unclear if unintentional vs intentional), Sepsis due to suspected aspiration, and AKI.  History of Present Illness:  Jeffrey Lin is a 47 year old male with a past medical history significant for anxiety, depression, chronic pain, schizoaffective disorder, hypertension who presented to [facility] ED on 01/13/2023 due to unresponsiveness.  Patient remains altered and is unable to contribute to history, and patient's brothers at bedside unable to contribute to history, therefore history obtained from chart review.  Per ED and nursing notes EMS was dispatched out to the patient's residence by his mother after she found him unresponsive.  She reported she last saw him well around midnight last night, and that when she went to check on him today she found him unresponsive with noisy respirations.  Upon EMS arrival he remained unresponsive, and was found to be hypoxic with sats in the 70s and 80s requiring manual bag mask ventilation.  He was given 2 mg of intranasal Narcan with EMS with some improvement in mental status, but remains lethargic.  Upon arrival to the ED he was noted to have audible rhonchi with O2 sats in the 80s on nonrebreather mask.  He was given 2 mg IV Narcan in the ED which he became more awake and was able to answer a few questions appropriately.  He admitted to taking 10 Percocet, although timing is unclear and intent is unclear.  ED Course: Initial Vital Signs: Temperature 98.4 F orally, pulse 128, respiratory rate 30, blood pressure 99/80, SpO2 76% on nonrebreather Significant Labs: Glucose 145, creatinine  2.6, AST 176, ALT 202, BNP 128, CK1 62, high-sensitivity troponin 531, WBC 17.6, procalcitonin 0.46, serum acetaminophen less than 10, salicylates less than 7, ethyl alcohol less than 10 VBG: pH 7.27/pCO2 71/O2 pending /bicarb 32.6 COVID-19/flu/RSV PCR negative Imaging Chest X-ray>> IMPRESSION: *Homogeneous opacification of bilateral lower hemi thoraces, which may be due to layering pleural effusions versus overlying soft tissue. Consider ultrasound versus lateral radiograph to attempt differentiation. Bilateral lungs are otherwise clear. No acute consolidation or lung collapse. CT Head w/o contrast>> IMPRESSION: 1.  No evidence of an acute intracranial abnormality. 2. Mild paranasal sinus disease as described. EKG: sinus tachycardia 125 bpm with a narrow QRS, normal axis, normal intervals nonspecific ST changes.  Medications Administered: 2 g IV Narcan, 500 cc normal saline bolus, IV Rocephin  Given his degree of hypoxia and respiratory status he was placed on BiPAP by ED provider.  Given high risk for need for intubation, PCCM asked to admit for further workup and treatment.  Please see "significant hospital events" section below for full detailed hospital course.  Pertinent  Medical History   Past Medical History:  Diagnosis Date   Anxiety    Chronic leg pain    Depression    Essential hypertension, benign 10/20/2015   Schizoaffective disorder (HCC)     Micro Data:  12/23: SARS-CoV-2/influenza/RSV PCR>> negative 12/23: Blood culture x 2>> 12/23: Strep pneumo urinary antigen>> 12/23: Legionella urinary antigen>>  Antimicrobials:   Anti-infectives (From admission, onward)    Start     Dose/Rate Route Frequency Ordered Stop   01/13/23 1800  Ampicillin-Sulbactam (UNASYN) 3 g in sodium chloride 0.9 %  100 mL IVPB        3 g 200 mL/hr over 30 Minutes Intravenous Every 6 hours 01/13/23 1435     01/13/23 1430  cefTRIAXone (ROCEPHIN) 2 g in sodium chloride 0.9 % 100 mL IVPB         2 g 200 mL/hr over 30 Minutes Intravenous  Once 01/13/23 1421         Significant Hospital Events: Including procedures, antibiotic start and stop dates in addition to other pertinent events   12/23: Presents to ED with presumed opiate drug overdose.  Found to be hypoxic requiring BiPAP.  PCCM asked to admit due to high risk of intubation.  Interim History / Subjective:  -Patient seen in the ED -Currently on BiPAP in no acute distress, RN at bedside reports respiratory status and FiO2 much improved -Currently protecting his airway, arouses easily to voice and follow commands, but unable to contribute to history -Hemodynamically stable  Objective   Blood pressure 106/80, pulse (!) 114, temperature 98.4 F (36.9 C), temperature source Oral, resp. rate 20, SpO2 99%.    FiO2 (%):  [100 %] 100 %   Intake/Output Summary (Last 24 hours) at 01/13/2023 1449 Last data filed at 01/13/2023 1429 Gross per 24 hour  Intake 500 ml  Output --  Net 500 ml   There were no vitals filed for this visit.  Examination: General: Critically ill-appearing male, laying in bed, on BiPAP, no acute distress HENT: Atraumatic, normocephalic, neck supple, no JVD Lungs: Mild expiratory wheezing to bilateral upper fields, otherwise clear, BiPAP assisted, even, nonlabored Cardiovascular: Tachycardia, regular rhythm, S1-S2, no murmurs, rubs, gallops Abdomen: Obese, soft, nontender, nondistended, no guarding room times, bowel sounds positive x 4 Extremities: Normal bulk and tone, no deformities, no edema, chronic trophic venous changes to bilateral lower extremities Neuro: Lethargic, arouses to voice, oriented only to self, able to follow simple commands with no focal deficits noted, pupils PERRLA GU: Deferred  Resolved Hospital Problem list     Assessment & Plan:   #Acute Hypoxic & Hypercapnic Respiratory Failure due to presumed Aspiration & Drug Overdose #Questionable undiagnosed COPD -Supplemental O2  as needed to maintain O2 sats >92% -BiPAP, wean as tolerated -Currently protecting his airway but high risk for intubation -Follow intermittent Chest X-ray & ABG as needed -Bronchodilators  -IV Steroids -ABX as above -Diuresis as BP and renal function permits -Pulmonary toilet as able  #Elevated Troponin in setting of demand ischemia vs NSTEMI #Tachycardia, suspect compensatory due to Acute Respiratory Failure PMHx: HTN -Continuous cardiac monitoring -Maintain MAP >65 -IV fluids -Vasopressors as needed to maintain MAP goal -Trend lactic acid until normalized -Trend HS Troponin until peaked -Echocardiogram pending -Depending on Troponin trend  #Sepsis in setting of presumed Aspiration (Meet SIRS Criteria on presentation: RR 23, HR 123, WBC 17.6) -Monitor fever curve -Trend WBC's & Procalcitonin -Follow cultures as above -Continue empiric Unasyn pending cultures & sensitivities -UA is pending  #Acute Kidney Injury -Monitor I&O's / urinary output -Follow BMP -Ensure adequate renal perfusion -Avoid nephrotoxic agents as able -Replace electrolytes as indicated ~ Pharmacy following for assistance with electrolyte replacement -IV fluids -CK was WNL at 162 -Obtain Renal US  #Elevated LFT's -Trend LFT's and coags -Avoid Hepatotoxic agents -Obtain RUQ [place]  #Acute Metabolic Encephalopathy #Presumed Opiate Overdose, unclear if unintentional vs intentional PMHx: anxiety, depression, chronic pain, schizoaffective disorder Serum Tylenol, salicylates, and ethyl alcohol all negative CT Head negative for acute intracranial abnormality -Treatment of metabolic derangements as outlined above -Provide  supportive care -Promote normal sleep/wake cycle and family presence -Avoid sedating medications as able -Urine drug screen is pending -May need to consider Narcan gtt    Patient is critically ill with suspected drug overdose and presumed aspiration.  Currently protecting his airway,  but high risk for further decompensation necessitating intubation and mechanical ventilation, along with cardiac arrest and death.   Best Practice (right click and "Reselect all SmartList Selections" daily)   Diet/type: NPO DVT prophylaxis: SCD (once CT head rules out head bleed, can start chemical DVT prophylaxis) GI prophylaxis: N/A Lines: N/A Foley:  N/A Code Status:  full code Last date of multidisciplinary goals of care discussion [N/A]  12/23: Pt's 2 brothers updated at bedside in the ED on plan of care.  Labs   CBC: Recent Labs  Lab 01/13/23 1236  WBC 17.6*  HGB 17.8*  HCT 53.5*  MCV 97.1  PLT 207    Basic Metabolic Panel: Recent Labs  Lab 01/13/23 1236  NA 137  K 4.1  CL 94*  CO2 28  GLUCOSE 145*  BUN 19  CREATININE 2.61*  CALCIUM 9.4   GFR: Estimated Creatinine Clearance: 51.9 mL/min (A) (by C-G formula based on SCr of 2.61 mg/dL (H)). Recent Labs  Lab 01/13/23 1236  WBC 17.6*    Liver Function Tests: Recent Labs  Lab 01/13/23 1236  AST 176*  ALT 202*  ALKPHOS 111  BILITOT 0.8  PROT 7.8  ALBUMIN 3.9   No results for input(s): "LIPASE", "AMYLASE" in the last 168 hours. No results for input(s): "AMMONIA" in the last 168 hours.  ABG    Component Value Date/Time   PHART 7.43 03/28/2018 0906   PCO2ART 63 (H) 03/28/2018 0906   PO2ART 67 (L) 03/28/2018 0906   HCO3 32.6 (H) 01/13/2023 1302   O2SAT 50.2 01/13/2023 1302     Coagulation Profile: No results for input(s): "INR", "PROTIME" in the last 168 hours.  Cardiac Enzymes: Recent Labs  Lab 01/13/23 1236  CKTOTAL 162    HbA1C: Hgb A1c MFr Bld  Date/Time Value Ref Range Status  11/05/2022 07:31 AM 6.5 4.6 - 6.5 % Final    Comment:    Glycemic Control Guidelines for People with Diabetes:Non Diabetic:  <6%Goal of Therapy: <7%Additional Action Suggested:  >8%   11/02/2021 09:39 AM 6.1 4.6 - 6.5 % Final    Comment:    Glycemic Control Guidelines for People with Diabetes:Non  Diabetic:  <6%Goal of Therapy: <7%Additional Action Suggested:  >8%     CBG: No results for input(s): "GLUCAP" in the last 168 hours.  Review of Systems:   Unable to assess due to AMS  Past Medical History:  He,  has a past medical history of Anxiety, Chronic leg pain, Depression, Essential hypertension, benign (10/20/2015), and Schizoaffective disorder (HCC).   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 He started smoking about 27 years ago. He  has a 41 pack-year smoking history. He has been exposed to tobacco smoke. He has quit using smokeless tobacco. He reports that he does not drink alcohol and does not use drugs.   Family History:  His family history includes Anxiety disorder in his brother and mother; Bipolar disorder in his maternal grandfather and paternal grandfather; Depression in his brother and mother; Hyperlipidemia in his mother; Hypertension in his mother; Lung disease in his father; Schizophrenia in his maternal grandfather.   Allergies Allergies  Allergen Reactions   Toradol [Ketorolac Tromethamine] Swelling   Lamotrigine Rash     Home Medications  Prior to Admission medications   Medication Sig Start Date End Date Taking? Authorizing Provider  ALPRAZolam Prudy Feeler) 1 MG tablet Take 1 mg by mouth 4 (four) times daily as needed. 06/09/21  Yes [provider]  amantadine (SYMMETREL) 100 MG capsule Take 100 mg by mouth 2 (two) times daily. 04/29/22   Yes [provider]  amphetamine-dextroamphetamine (ADDERALL) 20 MG tablet Take 20 mg by mouth in the morning, at noon, and at bedtime.   Yes [provider]  atorvastatin (LIPITOR) 10 MG tablet Take 1 tablet (10 mg total) by mouth daily. 07/04/22  Yes Bedsole, Amy E, MD  carvedilol (COREG) 25 MG tablet TAKE 1 TABLET (25 MG TOTAL) BY MOUTH TWICE A DAY WITH MEALS 12/23/22  Yes Bedsole, Amy E, MD  Cholecalciferol (VITAMIN D3) 1.25 MG (50000 UT) CAPS Take 1 capsule (1.25 mg total) by mouth once a week. 11/12/22  Yes Bedsole, Amy E, MD  FIBER PO Take by mouth.   Yes [provider]  gabapentin (NEURONTIN) 600 MG tablet Take 1,200 mg by mouth 3 (three) times daily.    Yes [provider]  risperiDONE (RISPERDAL) 2 MG tablet Take 2 mg by mouth at bedtime. 04/29/22  Yes [provider]  tiZANidine (ZANAFLEX) 2 MG tablet TAKE 1 TABLET (2 MG TOTAL) BY MOUTH 3 (THREE) TIMES DAILY AS NEEDED FOR MUSCLE SPASMS. 11/12/22  Yes Bedsole, Amy E, MD  traMADol (ULTRAM) 50 MG tablet Take 1-2 tablets (50-100 mg total) by mouth every 8 (eight) hours as needed for up to 5 days. 01/10/23 01/15/23 Yes Excell Seltzer, MD     Critical care time: 55 minutes     Harlon Ditty, AGACNP-BC [facility] Prefer epic messenger for cross cover needs If after hours, please call E-link

## 2023-01-13 NOTE — Progress Notes (Signed)
The patient was transported to CT and transported back to the emergency room. There were no issues with the transport.

## 2023-01-14 ENCOUNTER — Inpatient Hospital Stay (HOSPITAL_COMMUNITY)
Admit: 2023-01-14 | Discharge: 2023-01-14 | Disposition: A | Discharge status: Home / Self Care | Payer: Medicare Other | Attending: Pulmonary Disease | Admitting: Pulmonary Disease

## 2023-01-14 ENCOUNTER — Inpatient Hospital Stay: Payer: Medicare Other

## 2023-01-14 ENCOUNTER — Encounter: Payer: Self-pay | Admitting: Pulmonary Disease

## 2023-01-14 DIAGNOSIS — J9601 Acute respiratory failure with hypoxia: Secondary | ICD-10-CM | POA: Diagnosis not present

## 2023-01-14 DIAGNOSIS — J9611 Chronic respiratory failure with hypoxia: Secondary | ICD-10-CM

## 2023-01-14 DIAGNOSIS — R7989 Other specified abnormal findings of blood chemistry: Secondary | ICD-10-CM

## 2023-01-14 DIAGNOSIS — J15212 Pneumonia due to Methicillin resistant Staphylococcus aureus: Secondary | ICD-10-CM | POA: Diagnosis not present

## 2023-01-14 LAB — RENAL FUNCTION PANEL
Albumin: 3.5 g/dL (ref 3.5–5.0)
Anion gap: 8 (ref 5–15)
BUN: 24 mg/dL — ABNORMAL HIGH (ref 6–20)
CO2: 31 mmol/L (ref 22–32)
Calcium: 8.7 mg/dL — ABNORMAL LOW (ref 8.9–10.3)
Chloride: 97 mmol/L — ABNORMAL LOW (ref 98–111)
Creatinine, Ser: 1.8 mg/dL — ABNORMAL HIGH (ref 0.61–1.24)
GFR, Estimated: 46 mL/min — ABNORMAL LOW (ref >60)
Glucose, Bld: 141 mg/dL — ABNORMAL HIGH (ref 70–99)
Phosphorus: 2.2 mg/dL — ABNORMAL LOW (ref 2.5–4.6)
Potassium: 4.3 mmol/L (ref 3.5–5.1)
Sodium: 136 mmol/L (ref 135–145)

## 2023-01-14 LAB — CBC
HCT: 49.8 % (ref 39.0–52.0)
Hemoglobin: 16.3 g/dL (ref 13.0–17.0)
MCH: 31.3 pg (ref 26.0–34.0)
MCHC: 32.7 g/dL (ref 30.0–36.0)
MCV: 95.8 fL (ref 80.0–100.0)
Platelets: 171 10*3/uL (ref 150–400)
RBC: 5.2 MIL/uL (ref 4.22–5.81)
RDW: 13.2 % (ref 11.5–15.5)
WBC: 18.1 10*3/uL — ABNORMAL HIGH (ref 4.0–10.5)
nRBC: 0 % (ref 0.0–0.2)

## 2023-01-14 LAB — URINALYSIS, COMPLETE (UACMP) WITH MICROSCOPIC
Bacteria, UA: NONE SEEN
Bilirubin Urine: NEGATIVE
Glucose, UA: NEGATIVE mg/dL
Hgb urine dipstick: NEGATIVE
Ketones, ur: NEGATIVE mg/dL
Leukocytes,Ua: NEGATIVE
Nitrite: NEGATIVE
Protein, ur: 100 mg/dL — AB
Specific Gravity, Urine: 1.026 (ref 1.005–1.030)
Squamous Epithelial / HPF: 0 /[HPF] (ref 0–5)
pH: 5 (ref 5.0–8.0)

## 2023-01-14 LAB — STREP PNEUMONIAE URINARY ANTIGEN: Strep Pneumo Urinary Antigen: NEGATIVE

## 2023-01-14 LAB — ECHOCARDIOGRAM COMPLETE
AR max vel: 2.87 cm2
AV Area VTI: 2.72 cm2
AV Area mean vel: 2.64 cm2
AV Mean grad: 4 mm[Hg]
AV Peak grad: 6.5 mm[Hg]
Ao pk vel: 1.27 m/s
Area-P 1/2: 5.97 cm2
Height: 77 in
MV VTI: 4.57 cm2
S' Lateral: 3.2 cm
Single Plane A4C EF: 48.8 %
Weight: 4109.37 [oz_av]

## 2023-01-14 LAB — HEPATITIS PANEL, ACUTE
HCV Ab: NONREACTIVE
Hep A IgM: NONREACTIVE
Hep B C IgM: NONREACTIVE
Hepatitis B Surface Ag: NONREACTIVE

## 2023-01-14 LAB — HEPATIC FUNCTION PANEL
ALT: 952 U/L — ABNORMAL HIGH (ref 0–44)
AST: 665 U/L — ABNORMAL HIGH (ref 15–41)
Albumin: 3.5 g/dL (ref 3.5–5.0)
Alkaline Phosphatase: 86 U/L (ref 38–126)
Bilirubin, Direct: 0.1 mg/dL (ref 0.0–0.2)
Indirect Bilirubin: 0.4 mg/dL (ref 0.3–0.9)
Total Bilirubin: 0.5 mg/dL (ref <1.2)
Total Protein: 6.8 g/dL (ref 6.5–8.1)

## 2023-01-14 LAB — HEPARIN LEVEL (UNFRACTIONATED)
Heparin Unfractionated: 0.3 [IU]/mL (ref 0.30–0.70)
Heparin Unfractionated: 0.29 [IU]/mL — ABNORMAL LOW (ref 0.30–0.70)
Heparin Unfractionated: 0.43 [IU]/mL (ref 0.30–0.70)
Heparin Unfractionated: 0.24 [IU]/mL — ABNORMAL LOW (ref 0.30–0.70)

## 2023-01-14 LAB — URINE DRUG SCREEN, QUALITATIVE (ARMC ONLY)
Amphetamines, Ur Screen: POSITIVE — AB
Barbiturates, Ur Screen: NOT DETECTED
Benzodiazepine, Ur Scrn: POSITIVE — AB
Cannabinoid 50 Ng, Ur ~~LOC~~: NOT DETECTED
Cocaine Metabolite,Ur ~~LOC~~: NOT DETECTED
MDMA (Ecstasy)Ur Screen: NOT DETECTED
Methadone Scn, Ur: NOT DETECTED
Opiate, Ur Screen: NOT DETECTED
Phencyclidine (PCP) Ur S: NOT DETECTED
Tricyclic, Ur Screen: NOT DETECTED

## 2023-01-14 LAB — ACETAMINOPHEN LEVEL: Acetaminophen (Tylenol), Serum: <10 ug/mL — ABNORMAL LOW (ref 10–30)

## 2023-01-14 LAB — LACTIC ACID, PLASMA
Lactic Acid, Venous: 1.6 mmol/L (ref 0.5–1.9)
Lactic Acid, Venous: 2 mmol/L (ref 0.5–1.9)

## 2023-01-14 LAB — TROPONIN I (HIGH SENSITIVITY)
Troponin I (High Sensitivity): 1166 ng/L (ref <18)
Troponin I (High Sensitivity): 1132 ng/L (ref <18)

## 2023-01-14 LAB — MAGNESIUM: Magnesium: 1.7 mg/dL (ref 1.7–2.4)

## 2023-01-14 LAB — HIV ANTIBODY (ROUTINE TESTING W REFLEX): HIV Screen 4th Generation wRfx: NONREACTIVE

## 2023-01-14 LAB — MRSA NEXT GEN BY PCR, NASAL: MRSA by PCR Next Gen: DETECTED — AB

## 2023-01-14 LAB — GLUCOSE, CAPILLARY: Glucose-Capillary: 138 mg/dL — ABNORMAL HIGH (ref 70–99)

## 2023-01-14 MED ORDER — VANCOMYCIN VARIABLE DOSE PER UNSTABLE RENAL FUNCTION (PHARMACIST DOSING): Status: DC

## 2023-01-14 MED ORDER — ALBUTEROL (5 MG/ML) CONTINUOUS INHALATION SOLN
5.0000 mg/h | INHALATION_SOLUTION | RESPIRATORY_TRACT | Status: DC
  Filled 2023-01-14: qty 20

## 2023-01-14 MED ORDER — CHLORHEXIDINE GLUCONATE CLOTH 2 % EX PADS
6.0000 | MEDICATED_PAD | Freq: Every day | CUTANEOUS | Status: DC
  Administered 2023-01-14 – 2023-01-16 (×3): 6 via TOPICAL

## 2023-01-14 MED ORDER — PIPERACILLIN-TAZOBACTAM 3.375 G IVPB
3.3750 g | Freq: Three times a day (TID) | INTRAVENOUS | Status: DC
  Administered 2023-01-14 – 2023-01-17 (×9): 3.375 g via INTRAVENOUS
  Filled 2023-01-14 (×9): qty 50

## 2023-01-14 MED ORDER — SODIUM CHLORIDE 0.9 % IV SOLN
500.0000 mg | INTRAVENOUS | Status: AC
  Administered 2023-01-14 – 2023-01-16 (×3): 500 mg via INTRAVENOUS
  Filled 2023-01-14 (×3): qty 5

## 2023-01-14 MED ORDER — ALPRAZOLAM 0.5 MG PO TABS
0.5000 mg | ORAL_TABLET | Freq: Two times a day (BID) | ORAL | Status: DC | PRN
  Administered 2023-01-16: 0.5 mg via ORAL
  Filled 2023-01-14: qty 1

## 2023-01-14 MED ORDER — VANCOMYCIN HCL 2000 MG/400ML IV SOLN
2000.0000 mg | Freq: Once | INTRAVENOUS | Status: AC
  Administered 2023-01-14: 2000 mg via INTRAVENOUS
  Filled 2023-01-14: qty 400

## 2023-01-14 MED ORDER — METOPROLOL TARTRATE 25 MG PO TABS
12.5000 mg | ORAL_TABLET | Freq: Two times a day (BID) | ORAL | Status: DC
  Administered 2023-01-14 – 2023-01-17 (×7): 12.5 mg via ORAL
  Filled 2023-01-14 (×8): qty 1

## 2023-01-14 MED ORDER — HEPARIN BOLUS VIA INFUSION
1700.0000 [IU] | Freq: Once | INTRAVENOUS | Status: AC
  Administered 2023-01-14: 1700 [IU] via INTRAVENOUS
  Filled 2023-01-14: qty 1700

## 2023-01-14 MED ORDER — DEXTROSE 5 % IV SOLN
15.0000 mmol | Freq: Once | INTRAVENOUS | Status: AC
  Administered 2023-01-14: 15 mmol via INTRAVENOUS
  Filled 2023-01-14: qty 5

## 2023-01-14 MED ORDER — LABETALOL HCL 5 MG/ML IV SOLN
10.0000 mg | Freq: Once | INTRAVENOUS | Status: AC
  Administered 2023-01-14: 10 mg via INTRAVENOUS
  Filled 2023-01-14: qty 4

## 2023-01-14 MED ORDER — NICARDIPINE HCL IN NACL 20-0.86 MG/200ML-% IV SOLN
3.0000 mg/h | INTRAVENOUS | Status: DC
  Administered 2023-01-14 – 2023-01-16 (×5): 5 mg/h via INTRAVENOUS
  Administered 2023-01-16: 3 mg/h via INTRAVENOUS
  Filled 2023-01-14 (×9): qty 200

## 2023-01-14 MED ORDER — ALPRAZOLAM 0.5 MG PO TABS
1.0000 mg | ORAL_TABLET | Freq: Two times a day (BID) | ORAL | Status: DC
  Administered 2023-01-14 – 2023-01-17 (×6): 1 mg via ORAL
  Filled 2023-01-14 (×6): qty 2

## 2023-01-14 MED ORDER — PERFLUTREN LIPID MICROSPHERE
1.0000 mL | INTRAVENOUS | Status: AC | PRN
  Administered 2023-01-14: 5 mL via INTRAVENOUS

## 2023-01-14 MED ORDER — AMPHETAMINE-DEXTROAMPHETAMINE 10 MG PO TABS
10.0000 mg | ORAL_TABLET | Freq: Three times a day (TID) | ORAL | Status: DC
  Administered 2023-01-15 – 2023-01-17 (×4): 10 mg via ORAL
  Filled 2023-01-14 (×4): qty 1

## 2023-01-14 MED ORDER — ATORVASTATIN CALCIUM 20 MG PO TABS: 80.0000 mg | ORAL_TABLET | Freq: Every day | ORAL | Status: DC

## 2023-01-14 MED ORDER — ALBUTEROL SULFATE (2.5 MG/3ML) 0.083% IN NEBU
INHALATION_SOLUTION | RESPIRATORY_TRACT | Status: AC
  Administered 2023-01-14: 10 mg
  Filled 2023-01-14: qty 12

## 2023-01-14 MED ORDER — ALBUTEROL (5 MG/ML) CONTINUOUS INHALATION SOLN
10.0000 mg | INHALATION_SOLUTION | Freq: Once | RESPIRATORY_TRACT | Status: AC
  Administered 2023-01-14: 10 mg via RESPIRATORY_TRACT
  Filled 2023-01-14: qty 20

## 2023-01-14 MED ORDER — FUROSEMIDE 10 MG/ML IJ SOLN
40.0000 mg | Freq: Once | INTRAMUSCULAR | Status: AC
  Administered 2023-01-14: 40 mg via INTRAVENOUS
  Filled 2023-01-14: qty 4

## 2023-01-14 MED ORDER — GABAPENTIN 300 MG PO CAPS
300.0000 mg | ORAL_CAPSULE | Freq: Three times a day (TID) | ORAL | Status: DC
  Administered 2023-01-14 – 2023-01-16 (×6): 300 mg via ORAL
  Filled 2023-01-14 (×6): qty 1

## 2023-01-14 MED ORDER — ASPIRIN 81 MG PO TBEC
81.0000 mg | DELAYED_RELEASE_TABLET | Freq: Every day | ORAL | Status: DC
  Administered 2023-01-14 – 2023-01-17 (×4): 81 mg via ORAL
  Filled 2023-01-14 (×4): qty 1

## 2023-01-14 NOTE — Consult Note (Signed)
PHARMACY - ANTICOAGULATION CONSULT NOTE  Pharmacy Consult for IV Heparin Indication: chest pain/ACS  Patient Measurements: Weight: (!) 140.1 kg (308 lb 13.8 oz) Heparin Dosing Weight: 115 kg  Labs: Recent Labs    01/13/23 1236 01/13/23 1438 01/13/23 1654 01/13/23 1825 01/14/23 0126  HGB 17.8*  --   --   --  16.3  HCT 53.5*  --   --   --  49.8  PLT 207  --   --   --  171  APTT  --   --   --  28  --   LABPROT  --   --   --  15.6*  --   INR  --   --   --  1.2  --   HEPARINUNFRC  --   --   --   --  0.30  CREATININE 2.61*  --   --   --  1.80*  CKTOTAL 162  --   --   --   --   TROPONINIHS 531* 886* 1,433* 1,738*  --     Estimated Creatinine Clearance: 78.4 mL/min (A) (by C-G formula based on SCr of 1.8 mg/dL (H)).  Medical History: Past Medical History:  Diagnosis Date   Anxiety    Chronic leg pain    Depression    Essential hypertension, benign 10/20/2015   Schizoaffective disorder (HCC)    Medications:  No anticoagulation prior to admission per my chart review  Assessment: 48 y/o M with medical history as above presenting with acute respiratory secondary to presumed drug overdose / aspiration event. Troponin elevated and trending up concerning for acute coronary syndrome. Pharmacy consulted to initiate and manage heparin infusion.  Baseline aPTT and INR are pending. Baseline CBC notable for polycythemia / elevated HCT.  Goal of Therapy:  Heparin level 0.3-0.7 units/ml Monitor platelets by anticoagulation protocol: Yes  12/24 0126 HL 0.30, therapeutic x 1   Plan:  --Continue heparin infusion at 1400 units/hr --Recheck HL in 6 hours to confirm --Daily CBC per protocol while on IV heparin  Otelia Sergeant, PharmD, [facility] 01/14/2023 2:14 AM

## 2023-01-14 NOTE — Plan of Care (Signed)
  Problem: Education: Goal: Knowledge of General Education information will improve Description: Including pain rating scale, medication(s)/side effects and non-pharmacologic comfort measures Outcome: Progressing   Problem: Health Behavior/Discharge Planning: Goal: Ability to manage health-related needs will improve Outcome: Progressing   Problem: Clinical Measurements: Goal: Ability to maintain clinical measurements within normal limits will improve Outcome: Progressing Goal: Will remain free from infection Outcome: Progressing Goal: Diagnostic test results will improve Outcome: Progressing Goal: Respiratory complications will improve Outcome: Progressing Goal: Cardiovascular complication will be avoided Outcome: Progressing   Problem: Activity: Goal: Risk for activity intolerance will decrease Outcome: Progressing   Problem: Coping: Goal: Level of anxiety will decrease Outcome: Progressing   Problem: Elimination: Goal: Will not experience complications related to bowel motility Outcome: Progressing Goal: Will not experience complications related to urinary retention Outcome: Progressing   Problem: Pain Management: Goal: General experience of comfort will improve Outcome: Progressing   Problem: Skin Integrity: Goal: Risk for impaired skin integrity will decrease Outcome: Progressing

## 2023-01-14 NOTE — Consult Note (Signed)
PHARMACY - ANTICOAGULATION CONSULT NOTE  Pharmacy Consult for IV Heparin Indication: chest pain/ACS  Patient Measurements: Height: 6\' 5"  (195.6 cm) Weight: 116.5 kg (256 lb 13.4 oz) IBW/kg (Calculated) : 89.1 Heparin Dosing Weight: 115 kg  Labs: Recent Labs    01/13/23 1236 01/13/23 1438 01/13/23 1825 01/14/23 0126 01/14/23 0126 01/14/23 0715 01/14/23 0927 01/14/23 1446 01/14/23 2046  HGB 17.8*  --   --  16.3  --   --   --   --   --   HCT 53.5*  --   --  49.8  --   --   --   --   --   PLT 207  --   --  171  --   --   --   --   --   APTT  --   --  28  --   --   --   --   --   --   LABPROT  --   --  15.6*  --   --   --   --   --   --   INR  --   --  1.2  --   --   --   --   --   --   HEPARINUNFRC  --   --   --  0.30   < > 0.29*  --  0.43 0.24*  CREATININE 2.61*  --   --  1.80*  --   --   --   --   --   CKTOTAL 162  --   --   --   --   --   --   --   --   TROPONINIHS 531*   < > 1,738*  --   --  1,166* 1,132*  --   --    < > = values in this interval not displayed.    Estimated Creatinine Clearance: 72.6 mL/min (A) (by C-G formula based on SCr of 1.8 mg/dL (H)).  Medical History: Past Medical History:  Diagnosis Date   Anxiety    Chronic heart failure with preserved ejection fraction (HFpEF) (HCC)    a. 02/2016 Echo: EF 55-60%; b. 04/2019 Echo: EF 60-65%, no rwma, mod reduced RV fxn, mod dil RA; c. 07/2019 RHC: RA 2, RV 39/1, PA 39/15, PCWP 7, CO 9.1, CI 3.4.   Chronic leg pain    Depression    Lower extremity cellulitis    Morbid obesity (HCC)    Primary hypertension    Schizoaffective disorder (HCC)    Tobacco abuse    Medications:  No anticoagulation prior to admission per my chart review  Assessment: 47 y/o M with medical history as above presenting with acute respiratory secondary to presumed drug overdose / aspiration event. Troponin elevated and trending up concerning for acute coronary syndrome. Pharmacy consulted to initiate and manage heparin  infusion.  Baseline aPTT and INR are pending. Baseline CBC notable for polycythemia / elevated HCT.  Goal of Therapy:  Heparin level 0.3-0.7 units/ml Monitor platelets by anticoagulation protocol: Yes  12/24 0126 HL 0.30, therapeutic x 1 12/24 0715 HL 0.29, subtherapeutic 12/24 1446 HL 0.43, therapeutic x1 12/24 2046 HL 0.24, subtherapeutic   Plan:  Give heparin bolus of 1700 units x1 Increase heparin infusion at 1800 units/hour Check heparin level in 6 hours Monitor daily heparin levels while on heparin infusion Monitor CBC and signs/symptoms of bleeding  Rockwell Alexandria, PharmD Clinical Pharmacist 01/14/2023 9:23 PM

## 2023-01-14 NOTE — Progress Notes (Signed)
NAME:  Jeffrey Lin, MRN:  [id number], DOB:  11/14/1975, LOS: 1 ADMISSION DATE:  01/13/2023, CONSULTATION DATE:  01/13/2023 REFERRING MD:  Dr. Lenard Lance, CHIEF COMPLAINT:  Drug overdose   Brief Pt Description / Synopsis:  47 y.o. male admitted with Acute Metabolic Encephalopathy, Acute Hypoxic and Hypercapnic Respiratory Failure, and Sepsis in the setting of Pneumonia (CAP vs Aspiration), along with AKI, and elevated troponin.  History of Present Illness:  Jeffrey Lin is a 47 year old male with a past medical history significant for anxiety, depression, chronic pain, schizoaffective disorder, hypertension who presented to [facility] ED on 01/13/2023 due to unresponsiveness.  Patient remains altered and is unable to contribute to history, and patient's brothers at bedside unable to contribute to history, therefore history obtained from chart review.  Per ED and nursing notes EMS was dispatched out to the patient's residence by his mother after she found him unresponsive.  She reported she last saw him well around midnight last night, and that when she went to check on him today she found him unresponsive with noisy respirations.  Upon EMS arrival he remained unresponsive, and was found to be hypoxic with sats in the 70s and 80s requiring manual bag mask ventilation.  He was given 2 mg of intranasal Narcan with EMS with some improvement in mental status, but remains lethargic.  Upon arrival to the ED he was noted to have audible rhonchi with O2 sats in the 80s on nonrebreather mask.  He was given 2 mg IV Narcan in the ED which he became more awake and was able to answer a few questions appropriately.  He admitted to taking 10 Percocet, although timing is unclear and intent is unclear. However he is not prescribed Percocet outpatient, only being prescribed Tramadol by his PCP.  ED Course: Initial Vital Signs: Temperature 98.4 F orally, pulse 128, respiratory rate 30, blood pressure 99/80, SpO2 76% on  nonrebreather Significant Labs: Glucose 145, creatinine 2.6, AST 176, ALT 202, BNP 128, CK1 62, high-sensitivity troponin 531, WBC 17.6, procalcitonin 0.46, serum acetaminophen less than 10, salicylates less than 7, ethyl alcohol less than 10 VBG: pH 7.27/pCO2 71/O2 pending /bicarb 32.6 COVID-19/flu/RSV PCR negative Imaging Chest X-ray>> IMPRESSION: *Homogeneous opacification of bilateral lower hemi thoraces, which may be due to layering pleural effusions versus overlying soft tissue. Consider ultrasound versus lateral radiograph to attempt differentiation. Bilateral lungs are otherwise clear. No acute consolidation or lung collapse. CT Head w/o contrast>> IMPRESSION: 1.  No evidence of an acute intracranial abnormality. 2. Mild paranasal sinus disease as described. EKG: sinus tachycardia 125 bpm with a narrow QRS, normal axis, normal intervals nonspecific ST changes.  Medications Administered: 2 g IV Narcan, 500 cc normal saline bolus, IV Rocephin  Given his degree of hypoxia and respiratory status he was placed on BiPAP by ED provider.  Given high risk for need for intubation, PCCM asked to admit for further workup and treatment.  Please see "significant hospital events" section below for full detailed hospital course.  Pertinent  Medical History   Past Medical History:  Diagnosis Date   Anxiety    Chronic leg pain    Depression    Essential hypertension, benign 10/20/2015   Schizoaffective disorder (HCC)     Micro Data:  12/23: SARS-CoV-2/influenza/RSV PCR>> negative 12/23: Blood culture x 2>> no growth to date 12/23: Strep pneumo urinary antigen>>negative 12/23: Legionella urinary antigen>> 12/24: MRSA PCR>> +  Antimicrobials:   Anti-infectives (From admission, onward)    Start  Dose/Rate Route Frequency Ordered Stop   01/13/23 1800  Ampicillin-Sulbactam (UNASYN) 3 g in sodium chloride 0.9 % 100 mL IVPB        3 g 200 mL/hr over 30 Minutes Intravenous Every 6  hours 01/13/23 1435     01/13/23 1430  cefTRIAXone (ROCEPHIN) 2 g in sodium chloride 0.9 % 100 mL IVPB        2 g 200 mL/hr over 30 Minutes Intravenous  Once 01/13/23 1421 01/13/23 1524       Significant Hospital Events: Including procedures, antibiotic start and stop dates in addition to other pertinent events   12/23: Presents to ED with presumed opiate drug overdose.  Found to be hypoxic requiring BiPAP.  PCCM asked to admit due to high risk of intubation. 12/24: Cardiology consulted for uptrending troponin (peaked at 1738). Echo pending.  Weaned off BiPAP to HHFNC, but requiring 100% FiO2, respiratory status is tenuous.  CT Chest concerning for pneumonia, ABX broadened to Azithromycin, Vancomycin, and Zosyn.  Will give 40 mg Lasix x1 dose due to severe hypoxia.  Interim History / Subjective:  -No significant events noted overnight -Afebrile, hemodynamically stable, weaned off BiPAP to HHFNC ~ respiratory status remains tenuous as he contiunues with high FiO2 requirements (100%) on HHFNC -Troponin peaked at 1743 ~ already on Heparin, Cardiology evaluated ~ recommends continued Heparin x48 hrs, ASA -Tox screen + for amphetamines and benzos~pt does take Adderall and Alprazolam outpatient  -Pt now awake and alert, DENIES taking any opiates, other illicit substances, or ETOH ~ has no recollection of what happened ~ LOW SUSPICION FOR OVERDOSE -Given high FiO2 requirements ~ will give 40 mg Lasix x1 dose & obtain CT Chest -Start Metoprolol 12.5 mg BID for tachycardia and HTN -Holding statins due to transaminitis -CT Chest resulted with pneumonia ~ given high FiO2 requirements will broaden ABX to Azithromycin, Vancomycin, and Zosyn for now  Objective   Blood pressure (!) 169/116, pulse (!) 106, temperature 98.2 F (36.8 C), resp. rate 18, weight (!) 140.1 kg, SpO2 95%.    FiO2 (%):  [40 %-100 %] 40 %   Intake/Output Summary (Last 24 hours) at 01/14/2023 0102 Last data filed at 01/13/2023  1524 Gross per 24 hour  Intake 600 ml  Output --  Net 600 ml   Filed Weights   01/13/23 1751  Weight: (!) 140.1 kg    Examination: General: Acutely ill-appearing male, laying in bed, on HHNC, with mild tachypnea but no acute distress HENT: Atraumatic, normocephalic, neck supple, no JVD Lungs: Coarse breaths sounds bilaterally, mild tachypnea and mild assessory muscle use, even,  Cardiovascular: Tachycardia, regular rhythm, S1-S2, no murmurs, rubs, gallops Abdomen: Obese, soft, nontender, nondistended, no guarding room times, bowel sounds positive x 4 Extremities: Normal bulk and tone, no deformities, no edema, chronic trophic venous changes to bilateral lower extremities Neuro: Awake and alert, oriented to self, place, and time. Able to follow simple commands with no focal deficits noted, pupils PERRLA, speech clear GU: Deferred, using urinal  Resolved Hospital Problem list     Assessment & Plan:   #Acute Hypoxic & Hypercapnic Respiratory Failure due to Acute Metabolic Encephalopathy and Pneumonia (Aspiration Pneumonia vs CAP) +/-  Pulmonary Edema #Questionable undiagnosed COPD with acute exacerbation CT Chest 12/24 concerning for large RLL opacity and small patchy opacities in LUL consistent with pneumonia -Supplemental O2 as needed to maintain O2 sats 88 to 92% -BiPAP as needed -Follow intermittent Chest X-ray & ABG as needed -Bronchodilators  -IV Steroids -ABX  as above -Diuresis as BP and renal function permits ~ will give 40 mg IV Lasix x1 dose on 12/24 given severe hypoxia -Pulmonary toilet as able -Will need to be set up with Pulmonology as outpatient  #Elevated Troponin in setting of demand ischemia vs NSTEMI #Tachycardia, suspect compensatory due to Acute Respiratory Failure #Chronic HFpEF #Hypertension PMHx: HTN -Continuous cardiac monitoring -Maintain MAP >65 -Vasopressors as needed to maintain MAP goal ~ NOT REQUIRING -Lactic acid has normalized -HS Troponin  peaked at 1738 -Echocardiogram pending -Cardiology consulted, appreciate input -Continue Heparin gtt x 48 hrs -Aspirin -Start Metoprolol 12.5 BID -Nicardipine gtt as needed -Holding Statin due to Transaminitis -Diuresis as BP and renal function permits ~ give 40 mg IV Lasix x1 dose 12/24  #Sepsis in setting of Pneumonia (CAP vs Aspiration) (Meet SIRS Criteria on presentation: RR 23, HR 123, WBC 17.6) -Monitor fever curve -Trend WBC's & Procalcitonin -Follow cultures as above -Will broaden ABX to Azithromycin, Vancomycin, and Zosyn for now pending cultures & sensitivities given extremely high FiO2 requirements  #Acute Kidney Injury ~ IMPROVED Renal Ultrasound normal -Monitor I&O's / urinary output -Follow BMP -Ensure adequate renal perfusion -Avoid nephrotoxic agents as able -Replace electrolytes as indicated ~ Pharmacy following for assistance with electrolyte replacement -CK was WNL at 162  #Elevated LFT's RUQ Ultrasound concerning for gallstones, but no evidence of acute cholecystitis or ductal dilation -Trend LFT's and coags -Avoid Hepatotoxic agents -Check Hepatitis panel -Pt denies that he took Percocet and serum tylenol negative x2  #Acute Metabolic Encephalopathy ~ RESOLVED Initially with suspicion for drug overdose, now with very low suspicion as pt denies taking anything besides his outpatient meds, and UDS + for Amphetamines (on adderall outpatient) and Benzodiazepines (on Xanax outpatient) PMHx: anxiety, depression, chronic pain, schizoaffective disorder Serum Tylenol, salicylates, and ethyl alcohol all negative CT Head negative for acute intracranial abnormality Tox screen + for Amphetamines and Benzodiazepines -Treatment of metabolic derangements as outlined above -Provide supportive care -Promote normal sleep/wake cycle and family presence -Avoid sedating medications as able     Patient is critically ill with severe Acute Hypoxic Respiratory from Pneumonia  and AECOPD.  Currently protecting his airway, but high risk for further decompensation necessitating intubation and mechanical ventilation, along with cardiac arrest and death.   Best Practice (right click and "Reselect all SmartList Selections" daily)   Diet/type: NPO until respiratory status improved DVT prophylaxis: Heparin gtt GI prophylaxis: N/A Lines: N/A Foley:  N/A Code Status:  full code Last date of multidisciplinary goals of care discussion [12/24]  12/24: Pt, his brother and his mother updated at bedside on plan of care.  Labs   CBC: Recent Labs  Lab 01/13/23 1236 01/14/23 0126  WBC 17.6* 18.1*  HGB 17.8* 16.3  HCT 53.5* 49.8  MCV 97.1 95.8  PLT 207 171    Basic Metabolic Panel: Recent Labs  Lab 01/13/23 1236 01/14/23 0126  NA 137 136  K 4.1 4.3  CL 94* 97*  CO2 28 31  GLUCOSE 145* 141*  BUN 19 24*  CREATININE 2.61* 1.80*  CALCIUM 9.4 8.7*  MG  --  1.7  PHOS  --  2.2*   GFR: Estimated Creatinine Clearance: 78.4 mL/min (A) (by C-G formula based on SCr of 1.8 mg/dL (H)). Recent Labs  Lab 01/13/23 1236 01/13/23 1438 01/13/23 1654 01/14/23 0126  PROCALCITON 0.46  --   --   --   WBC 17.6*  --   --  18.1*  LATICACIDVEN  --  2.7* 2.2*  --  Liver Function Tests: Recent Labs  Lab 01/13/23 1236 01/14/23 0126  AST 176*  --   ALT 202*  --   ALKPHOS 111  --   BILITOT 0.8  --   PROT 7.8  --   ALBUMIN 3.9 3.5   No results for input(s): "LIPASE", "AMYLASE" in the last 168 hours. No results for input(s): "AMMONIA" in the last 168 hours.  ABG    Component Value Date/Time   PHART 7.43 03/28/2018 0906   PCO2ART 63 (H) 03/28/2018 0906   PO2ART 67 (L) 03/28/2018 0906   HCO3 32.6 (H) 01/13/2023 1302   O2SAT 50.2 01/13/2023 1302     Coagulation Profile: Recent Labs  Lab 01/13/23 1825  INR 1.2    Cardiac Enzymes: Recent Labs  Lab 01/13/23 1236  CKTOTAL 162    HbA1C: Hgb A1c MFr Bld  Date/Time Value Ref Range Status  11/05/2022  07:31 AM 6.5 4.6 - 6.5 % Final    Comment:    Glycemic Control Guidelines for People with Diabetes:Non Diabetic:  <6%Goal of Therapy: <7%Additional Action Suggested:  >8%   11/02/2021 09:39 AM 6.1 4.6 - 6.5 % Final    Comment:    Glycemic Control Guidelines for People with Diabetes:Non Diabetic:  <6%Goal of Therapy: <7%Additional Action Suggested:  >8%     CBG: No results for input(s): "GLUCAP" in the last 168 hours.  Review of Systems:   Positives in BOLD: Gen: Denies fever, chills, weight change, fatigue, night sweats HEENT: Denies blurred vision, double vision, hearing loss, tinnitus, sinus congestion, rhinorrhea, sore throat, neck stiffness, dysphagia PULM: Denies shortness of breath, cough, sputum production, hemoptysis, wheezing CV: Denies chest pain, edema, orthopnea, paroxysmal nocturnal dyspnea, palpitations GI: Denies abdominal pain, nausea, vomiting, diarrhea, hematochezia, melena, constipation, change in bowel habits GU: Denies dysuria, hematuria, polyuria, oliguria, urethral discharge Endocrine: Denies hot or cold intolerance, polyuria, polyphagia or appetite change Derm: Denies rash, dry skin, scaling or peeling skin change Heme: Denies easy bruising, bleeding, bleeding gums Neuro: Denies headache, numbness, weakness, slurred speech, loss of memory or consciousness    Past Medical History:  He,  has a past medical history of Anxiety, Chronic leg pain, Depression, Essential hypertension, benign (10/20/2015), and Schizoaffective disorder (HCC).   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 He started smoking about 27 years ago. He has a 41 pack-year smoking history. He has been exposed to tobacco smoke. He has quit using smokeless tobacco. He reports that he does not drink alcohol and does not use drugs.   Family History:  His family history includes Anxiety disorder in his brother and mother; Bipolar disorder in his maternal grandfather and paternal grandfather; Depression in his brother and mother; Hyperlipidemia in his mother; Hypertension in his mother; Lung disease in his father; Schizophrenia in his maternal grandfather.   Allergies Allergies  Allergen Reactions   Toradol [Ketorolac Tromethamine] Swelling   Lamotrigine  Rash     Home Medications  Prior to Admission medications   Medication Sig Start Date End Date Taking? Authorizing Provider  ALPRAZolam Prudy Feeler) 1 MG tablet Take 1 mg by mouth 4 (four) times daily as needed. 06/09/21  Yes [provider]  amantadine (SYMMETREL) 100 MG capsule Take 100 mg by mouth 2 (two) times daily. 04/29/22  Yes [provider]  amphetamine-dextroamphetamine (ADDERALL) 20 MG tablet Take 20 mg by mouth in the morning, at noon, and at bedtime.   Yes [provider]  atorvastatin (LIPITOR) 10 MG tablet Take 1 tablet (10 mg total) by mouth daily. 07/04/22  Yes Bedsole, Amy E, MD  carvedilol (COREG) 25 MG tablet TAKE 1 TABLET (25 MG TOTAL) BY MOUTH TWICE A DAY WITH MEALS 12/23/22  Yes Bedsole, Amy E, MD  Cholecalciferol (VITAMIN D3) 1.25 MG (50000 UT) CAPS Take 1 capsule (1.25 mg total) by mouth once a week. 11/12/22  Yes Bedsole, Amy E, MD  FIBER PO Take by mouth.   Yes [provider]  gabapentin (NEURONTIN) 600 MG tablet Take 1,200 mg by mouth 3 (three) times daily.    Yes [provider]  risperiDONE (RISPERDAL) 2 MG tablet Take 2 mg by mouth at bedtime. 04/29/22  Yes  [provider]  tiZANidine (ZANAFLEX) 2 MG tablet TAKE 1 TABLET (2 MG TOTAL) BY MOUTH 3 (THREE) TIMES DAILY AS NEEDED FOR MUSCLE SPASMS. 11/12/22  Yes Bedsole, Amy E, MD  traMADol (ULTRAM) 50 MG tablet Take 1-2 tablets (50-100 mg total) by mouth every 8 (eight) hours as needed for up to 5 days. 01/10/23 01/15/23 Yes Excell Seltzer, MD     Critical care time: 42 minutes     Harlon Ditty, AGACNP-BC [facility] Prefer epic messenger for cross cover needs If after hours, please call E-link

## 2023-01-14 NOTE — Consult Note (Signed)
PHARMACY - ANTICOAGULATION CONSULT NOTE  Pharmacy Consult for IV Heparin Indication: chest pain/ACS  Patient Measurements: Weight: (!) 140.1 kg (308 lb 13.8 oz) Heparin Dosing Weight: 115 kg  Labs: Recent Labs    01/13/23 1236 01/13/23 1438 01/13/23 1654 01/13/23 1825 01/14/23 0126 01/14/23 0715  HGB 17.8*  --   --   --  16.3  --   HCT 53.5*  --   --   --  49.8  --   PLT 207  --   --   --  171  --   APTT  --   --   --  28  --   --   LABPROT  --   --   --  15.6*  --   --   INR  --   --   --  1.2  --   --   HEPARINUNFRC  --   --   --   --  0.30 0.29*  CREATININE 2.61*  --   --   --  1.80*  --   CKTOTAL 162  --   --   --   --   --   TROPONINIHS 531* 886* 1,433* 1,738*  --   --     Estimated Creatinine Clearance: 78.4 mL/min (A) (by C-G formula based on SCr of 1.8 mg/dL (H)).  Medical History: Past Medical History:  Diagnosis Date   Anxiety    Chronic leg pain    Depression    Essential hypertension, benign 10/20/2015   Schizoaffective disorder (HCC)    Medications:  No anticoagulation prior to admission per my chart review  Assessment: 47 y/o M with medical history as above presenting with acute respiratory secondary to presumed drug overdose / aspiration event. Troponin elevated and trending up concerning for acute coronary syndrome. Pharmacy consulted to initiate and manage heparin infusion.  Baseline aPTT and INR are pending. Baseline CBC notable for polycythemia / elevated HCT.  Goal of Therapy:  Heparin level 0.3-0.7 units/ml Monitor platelets by anticoagulation protocol: Yes  12/24 0126 HL 0.30, therapeutic x 1 12/24 0715 HL 0.29, subtherapeutic   Plan:  --Bolus 1700 units x 1 --Increase heparin infusion to 1600 units/hr --Recheck HL in 6 hours to confirm --Daily CBC per protocol while on IV heparin  Bettey Costa, PharmD Clinical Pharmacist 01/14/2023 8:14 AM

## 2023-01-14 NOTE — ED Notes (Signed)
MD made aware pt BP trending high. Pt reports takes BP meds regularly

## 2023-01-14 NOTE — ED Notes (Addendum)
RN encouraging pt to change into hospital gown for comfort. Pt declines at this time and wanting to stay in street clothes. Pt denies any needs at this time.

## 2023-01-14 NOTE — Progress Notes (Addendum)
Pharmacy Antibiotic Note  Jeffrey Lin is a 47 y.o. male w/ PMH of  PMH significant for anxiety, chronic pain, schizoaffective disorder, hypertension admitted on 01/13/2023 with a drug overdose.  Pharmacy has been consulted for Zosyn dosing ISO suspected aspiration PNA. Serum creatinine notably elevated above apparent baseline level   Plan:   1) start Zosyn 3.375 grams IV 8 hours (extended infusion) ---follow renal function for needed dose adjustments  2) start vancomycin 2000 mg IV x 1  ---variable dose holder in place given AKI ---BMP in am to assess creatine clearance ---random vancomycin level in am   Height: 6\' 5"  (195.6 cm) Weight: 116.5 kg (256 lb 13.4 oz) IBW/kg (Calculated) : 89.1  Temp (24hrs), Avg:98.1 F (36.7 C), Min:97.6 F (36.4 C), Max:98.4 F (36.9 C)  Recent Labs  Lab 01/13/23 1236 01/13/23 1438 01/13/23 1654 01/14/23 0126 01/14/23 0726 01/14/23 0927  WBC 17.6*  --   --  18.1*  --   --   CREATININE 2.61*  --   --  1.80*  --   --   LATICACIDVEN  --  2.7* 2.2*  --  1.6 2.0*    Estimated Creatinine Clearance: 72.6 mL/min (A) (by C-G formula based on SCr of 1.8 mg/dL (H)).    Allergies  Allergen Reactions   Toradol [Ketorolac Tromethamine] Swelling   Lamotrigine Rash    Antimicrobials this admission: 12/23 Unasyn >> 12/24 12/24 azithromycin >> 12/24 Zosyn >> 12/24 vancomycin >>  Microbiology results: 12/23 BCx: NGTD 12/24 MRSA PCR (+)  Thank you for allowing pharmacy to be a part of this patient's care.  Lowella Bandy 01/14/2023 12:28 PM

## 2023-01-14 NOTE — Plan of Care (Signed)

## 2023-01-14 NOTE — Progress Notes (Signed)
*  PRELIMINARY RESULTS* Echocardiogram 2D Echocardiogram has been performed.  Jeffrey Lin 01/14/2023, 3:07 PM

## 2023-01-14 NOTE — Consult Note (Signed)
PHARMACY - ANTICOAGULATION CONSULT NOTE  Pharmacy Consult for IV Heparin Indication: chest pain/ACS  Patient Measurements: Height: 6\' 5"  (195.6 cm) Weight: 116.5 kg (256 lb 13.4 oz) IBW/kg (Calculated) : 89.1 Heparin Dosing Weight: 115 kg  Labs: Recent Labs    01/13/23 1236 01/13/23 1438 01/13/23 1825 01/14/23 0126 01/14/23 0715 01/14/23 0927 01/14/23 1446  HGB 17.8*  --   --  16.3  --   --   --   HCT 53.5*  --   --  49.8  --   --   --   PLT 207  --   --  171  --   --   --   APTT  --   --  28  --   --   --   --   LABPROT  --   --  15.6*  --   --   --   --   INR  --   --  1.2  --   --   --   --   HEPARINUNFRC  --   --   --  0.30 0.29*  --  0.43  CREATININE 2.61*  --   --  1.80*  --   --   --   CKTOTAL 162  --   --   --   --   --   --   TROPONINIHS 531*   < > 1,738*  --  1,166* 1,132*  --    < > = values in this interval not displayed.    Estimated Creatinine Clearance: 72.6 mL/min (A) (by C-G formula based on SCr of 1.8 mg/dL (H)).  Medical History: Past Medical History:  Diagnosis Date   Anxiety    Chronic heart failure with preserved ejection fraction (HFpEF) (HCC)    a. 02/2016 Echo: EF 55-60%; b. 04/2019 Echo: EF 60-65%, no rwma, mod reduced RV fxn, mod dil RA; c. 07/2019 RHC: RA 2, RV 39/1, PA 39/15, PCWP 7, CO 9.1, CI 3.4.   Chronic leg pain    Depression    Lower extremity cellulitis    Morbid obesity (HCC)    Primary hypertension    Schizoaffective disorder (HCC)    Tobacco abuse    Medications:  No anticoagulation prior to admission per my chart review  Assessment: 47 y/o M with medical history as above presenting with acute respiratory secondary to presumed drug overdose / aspiration event. Troponin elevated and trending up concerning for acute coronary syndrome. Pharmacy consulted to initiate and manage heparin infusion.  Baseline aPTT and INR are pending. Baseline CBC notable for polycythemia / elevated HCT.  Goal of Therapy:  Heparin level 0.3-0.7  units/ml Monitor platelets by anticoagulation protocol: Yes  12/24 0126 HL 0.30, therapeutic x 1 12/24 0715 HL 0.29, subtherapeutic 12/24 1446 HL 0.43, therapeutic x1   Plan:  Continue heparin infusion at 1600 units/hour Check confirmatory heparin level in 6 hours Monitor daily heparin levels while on heparin infusion Monitor CBC and signs/symptoms of bleeding  Rockwell Alexandria, PharmD Clinical Pharmacist 01/14/2023 3:23 PM

## 2023-01-14 NOTE — Consult Note (Signed)
PHARMACY CONSULT NOTE - ELECTROLYTES  Pharmacy Consult for Electrolyte Monitoring and Replacement   Recent Labs: Potassium (mmol/L)  Date Value  01/14/2023 4.3  09/11/2013 3.6   Magnesium (mg/dL)  Date Value  84/69/6295 1.7  09/11/2013 2.1   Calcium (mg/dL)  Date Value  28/41/3244 8.7 (L)   Calcium, Total (mg/dL)  Date Value  01/23/7251 8.0 (L)   Albumin (g/dL)  Date Value  66/44/0347 3.5  09/09/2013 4.6   Albumin Serum (g/dL)  Date Value  42/59/5638 4.5   Phosphorus (mg/dL)  Date Value  75/64/3329 2.2 (L)   Sodium (mmol/L)  Date Value  01/14/2023 136  07/30/2019 140  09/11/2013 145   Weight: (!) 140.1 kg (308 lb 13.8 oz) Estimated Creatinine Clearance: 78.4 mL/min (A) (by C-G formula based on SCr of 1.8 mg/dL (H)).  Assessment  Jeffrey Lin is a 47 y.o. male presenting with drug overdose. PMH significant for anxiety, chronic pain, schizoaffective disorder, hypertension . Pharmacy has been consulted to monitor and replace electrolytes.  Diet: NPO, on BiPAP MIVF: N/A Pertinent medications: N/A  Goal of Therapy: Electrolytes within normal limits  Plan:  Phos 2.2: NaPhos IV x 1 Follow-up electrolytes with AM labs tomorrow  Thank you for allowing pharmacy to be a part of this patient's care.  Gustav Knueppel A [name] 01/14/2023 8:22 AM

## 2023-01-14 NOTE — ED Notes (Signed)
Pt assisted to side of bed to use urinal 

## 2023-01-14 NOTE — Consult Note (Addendum)
Cardiology Consult    Patient ID: YVENS STOBER MRN: [id number], DOB/AGE: [birth year]   Admit date: 01/13/2023 Date of Consult: 01/14/2023  Primary Physician: Excell Seltzer, MD Primary Cardiologist: Lorine Bears, MD Requesting Provider: Shela Commons. Assaker, MD  Patient Profile    Jeffrey Lin is a 47 y.o. male with a history of anxiety, depression, chronic pain, schizoaffective d/o, HTN, and obeisty , who is being seen today for the evaluation of elevated troponin in the setting of sepsis and aspiration PNA at the request of Dr. Larinda Buttery.  Past Medical History  Subjective  Past Medical History:  Diagnosis Date   Anxiety    Chronic heart failure with preserved ejection fraction (HFpEF) (HCC)    a. 02/2016 Echo: EF 55-60%; b. 04/2019 Echo: EF 60-65%, no rwma, mod reduced RV fxn, mod dil RA; c. 07/2019 RHC: RA 2, RV 39/1, PA 39/15, PCWP 7, CO 9.1, CI 3.4.   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 male with a history of anxiety, depression, chronic pain, schizoaffective d/o, HTN, and obesity.  He was previously evaluated in cardiology clinic in 07/2014 in the setting of atypical chest pain.  Echo in 02/2016 showed nl LV fxn.  In 04/2019, he  was hospitalized w/ lower ext cellulitis and swelling.  Echo showed nl LVEF w/o significant valvular dzs.  He established w/ Dr. Kirke Corin in the outpt setting in 07/2019, at which time he cont to have significant bilat lower ext edema, blisters, oozing, and stasis dermatitis.  He was maintained on oral torsemide and lymphedema pumps were ordered.  RHC in 07/2019 showed relatively nl filling pressures w/ mild PAH (39/15) and he was referred for sleep study.  Pt notes that over the past few years, he has done reasonably well.  He lives locally w/ his mother.  He does not routinely exercise and is on disability due to severe anxiety.  He denies any recent h/o chest pain, dyspnea, or lower ext edema.  He has no recollection of the events that led to his current admission.  His brother is at the bedside and says that on the morning of 12/22, around 10:30 AM, he received a call from his mother stating that pt was somnolent and un-arousable.  EMS was called.  There was initially concern for drug overdose and he was treated w/ Narcan.  Upon arrival to the ED, per notes, he was alert but speech was unclear.  He was afebrile, tachycardic, hypoxic (76% on RA), and relatively hypotensive (systolic in the 90's).  ECG showed sinus tachycardia @ 125 w/ LAE, and incomplete  RBBB.  WBC 17.6. Lactic acid 2.7. Resp panel neg.  Creat 2.61 (prev 1.0 in 10/2022), AST 176, ALT 202, BNP 128.2, HsTrop 531  886  1433  1738.  UDS + benzos and amphetamines. BC pending x 2.  CT head w/o acute intracranial abnormality.  CXR w/ homogeneous opacification of bilateral lower hemi thoraces, which may be due to layering pleural effusions versus overlying soft tissue.  He was placed on BiPAP and treated w/ saline bolus and IV rocephin.    F/u CXR this AM w/ small bilat layering pl effusions, increased lateral L base opacity - atx vs consolidation, elevated R hemidiaphragm, mild cardiomegaly w/o CHF, and Ao atherosclerosis.  He has been weaned to nasal  cannula and is now admitted to ICU.  Brother at bedside.  Pt remains dyspneic and diaphoretic  Placed on HFNC.  In discussing his UDS, he says that he takes Jeffrey Lin and Jeffrey Lin @ home.  He denies any intentional or accidental overdose.  Inpatient Medications  Subjective    aspirin EC  81 mg Oral Daily   Chlorhexidine Gluconate Cloth  6 each Topical Daily   furosemide  40 mg Intravenous Once   ipratropium-albuterol  3 mL Nebulization Q4H   methylPREDNISolone (SOLU-MEDROL) injection  40 mg Intravenous Daily   metoprolol tartrate  12.5 mg Oral BID    Family History    Family History  Problem Relation Age of Onset   Hyperlipidemia Mother    Hypertension Mother    Depression Mother    Anxiety disorder Mother    Lung disease Father    Depression Brother    Anxiety disorder Brother    Bipolar disorder Maternal Grandfather    Schizophrenia Maternal Grandfather    Bipolar disorder Paternal Grandfather    He indicated that his mother is alive. He indicated that his father is deceased. He indicated that all of his four brothers are alive. He indicated that his maternal grandfather is deceased. He indicated that his paternal grandfather is deceased.   Social History    Social History   Socioeconomic History   Marital status: Single    Spouse name: Not on file   Number of children: 0   Years of education: 14   Highest education level: Not on file  Occupational History   Occupation: Marketing executive    Employer:  [employer]  Tobacco Use   Smoking status: Every Day    Current packs/day: 1.50    Average packs/day: 1.5 packs/day for 27.3 years (41.0 ttl pk-yrs)    Types: Cigarettes    Start date: 09/22/1995    Passive exposure: Past   Smokeless tobacco: Former   Tobacco comments:    Pt states that he is cutting back - currently 1- 1.5 ppd.  Vaping Use   Vaping status: Never Used  Substance and Sexual Activity   Alcohol use: No    Comment: previously drank  heavily - none in several years   Drug use: No   Sexual activity: Not Currently  Other Topics Concern   Not on file  Social History Narrative   Lives locally w/ mother.  Does not routinely exercise.   Exercise:  None, running causes head pain   Diet:  FF once daily, + fruits and veggies, + H2O, + Soda   Lives with mom in a one story home (has 5 steps).  No children.  Currently not working - disabled due to severe anxiety.  Education: associate degree.  Right handed   One story home   Social Drivers of Health   Financial Resource Strain: Low Risk  (11/07/2022)   Overall Financial Resource Strain (CARDIA)    Difficulty of Paying Living Expenses: Not hard at all  Food Insecurity: No Food Insecurity (11/07/2022)   Hunger Vital Sign    Worried About Running Out of Food in the Last Year: Never true    Ran Out of Food in the Last Year: Never true  Transportation Needs: No Transportation Needs (11/07/2022)   PRAPARE - Administrator, Civil Service (Medical): No    Lack of Transportation (Non-Medical): No  Physical Activity: Insufficiently Active (11/07/2022)   Exercise Vital Sign    Days of Exercise per Week: 3 days    Minutes of Exercise per Session: 30 min  Stress: No Stress Concern Present (11/07/2022)   [employer] of Occupational Health - Occupational Stress Questionnaire    Feeling of Stress : Not at all  Social Connections: Socially Isolated (11/07/2022)   Social Connection and Isolation Panel [NHANES]    Frequency of Communication with Friends and Family: More than three times a week    Frequency of Social Gatherings with Friends and Family: More than three times a week    Attends Religious Services: Never    Database administrator or Organizations: No    Attends Banker Meetings: Never    Marital Status: Never married  Intimate Partner Violence: Not At Risk (11/07/2022)   Humiliation, Afraid, Rape, and Kick questionnaire    Fear of Current  or Ex-Partner: No    Emotionally Abused: No    Physically Abused: No    Sexually Abused: No     Review of Systems    General:  +++ diaphoretic.  No chills, fever, night sweats or weight changes.  Cardiovascular:  No chest pain, +++ dyspnea, no edema, orthopnea, palpitations, paroxysmal nocturnal dyspnea. Dermatological: No rash, lesions/masses Respiratory: No cough, +++ dyspnea Urologic: No hematuria, dysuria Abdominal:   No nausea, vomiting, diarrhea, bright red blood per rectum, melena, or hematemesis Neurologic:  No visual changes, wkns, +++ changes in mental status/somnolence prior to admission. All other systems reviewed and are otherwise negative except as noted above.    Objective  Physical Exam    Blood pressure (!) 156/125, pulse 97, temperature 97.6 F (36.4 C), temperature source Oral, resp. rate 18, height 6\' 5"  (1.956 m), weight 116.5 kg, SpO2 90%.  General: Pleasant, diaphoretic.  Psych: Flat affect. Neuro: Alert and oriented X 3. Moves all extremities spontaneously. HEENT: Normal  Neck: Supple without bruits or JVD. Lungs:  Resp regular and unlabored, diminished breath sounds bilat w/ rhonchi throughout. Heart: RRR no s3, s4, or murmurs. Abdomen: Soft, non-tender, non-distended, BS + x 4.  Extremities: No clubbing, cyanosis or edema. DP/PT2+, Radials 2+ and equal bilaterally.  Labs    Cardiac Enzymes Recent Labs  Lab 01/13/23 1236 01/13/23 1438 01/13/23 1654 01/13/23 1825 01/14/23 0715  TROPONINIHS 531* 886* 1,433* 1,738* 1,166*     BNP    Component Value Date/Time   BNP 128.2 (H) 01/13/2023 1236   BNP 16 05/07/2019 1714    ProBNP    Component Value Date/Time   PROBNP 37.0 07/27/2019 1630    Lab Results  Component Value Date   WBC 18.1 (H) 01/14/2023   HGB 16.3 01/14/2023   HCT 49.8 01/14/2023   MCV 95.8 01/14/2023   PLT 171 01/14/2023    Recent Labs  Lab 01/14/23  0126 01/14/23 0715  NA 136  --   K 4.3  --   CL 97*  --   CO2 31   --   BUN 24*  --   CREATININE 1.80*  --   CALCIUM 8.7*  --   PROT  --  6.8  BILITOT  --  0.5  ALKPHOS  --  86  ALT  --  952*  AST  --  665*  GLUCOSE 141*  --    Lab Results  Component Value Date   CHOL 165 11/05/2022   HDL 32.60 (L) 11/05/2022   LDLCALC 77 11/05/2022   TRIG 277.0 (H) 11/05/2022   Lab Results  Component Value Date   DDIMER 0.48 12/28/2018      Radiology Studies    DG Chest Port 1 View Result Date: 01/14/2023 CLINICAL DATA:  5626.  Acute respiratory failure. EXAM: PORTABLE CHEST 1 VIEW COMPARISON:  Portable chest yesterday 12:51 p.m. FINDINGS: 4:33 a.m.  There are small bilateral layering pleural effusions. On the left there is increased opacity in the lateral left base which could be atelectasis or consolidation. On the right the chest base is obscured by the elevated hemidiaphragm which could obscure consolidation. Elsewhere the lungs are clear. There is mild cardiomegaly without evidence of CHF. The mediastinum is normally outlined. There is aortic atherosclerosis. No acute regional osseous findings. IMPRESSION: 1. Small bilateral layering pleural effusions. 2. Increased opacity in the lateral left base which could be atelectasis or consolidation. 3. Elevated right hemidiaphragm which could obscure consolidation. 4. Mild cardiomegaly without evidence of CHF. 5. Aortic atherosclerosis. Electronically Signed   By: Almira Bar M.D.   On: 01/14/2023 05:03   US RENAL Result Date: 01/13/2023 CLINICAL DATA:  Acute kidney injury. EXAM: RENAL / URINARY TRACT ULTRASOUND COMPLETE COMPARISON:  None Available. FINDINGS: Right Kidney: Renal measurements: 13.7 cm x 5.7 cm x 5.5 cm = volume: 224.59 mL. Echogenicity within normal limits. No mass or hydronephrosis visualized. Left Kidney: Renal measurements: 12.1 cm x 6.5 cm x 5.0 cm = volume: 204.9 mL. Echogenicity within normal limits. No mass or hydronephrosis visualized. Bladder: Appears normal for degree of bladder distention.  Other: None. IMPRESSION: Unremarkable renal ultrasound. Electronically Signed   By: Aram Candela M.D.   On: 01/13/2023 18:33   US Abdomen Limited RUQ (LIVER/GB) Result Date: 01/13/2023 CLINICAL DATA:  Elevated liver function tests. EXAM: ULTRASOUND ABDOMEN LIMITED RIGHT UPPER QUADRANT COMPARISON:  06/14/2019 FINDINGS: Gallbladder: A probable "wall echo shadow" sign is identified within the expected location of the gallbladder. No pericholecystic edema or wall thickening. Sonographic Murphy's sign was not elicited. Common bile duct: Diameter: Normal, 3 mm Liver: No focal lesion identified. Within normal limits in parenchymal echogenicity. Portal vein is patent on color Doppler imaging with normal direction of blood flow towards the liver. Other: None. IMPRESSION: "Wall echo shadow" sign, most typically seen with stone filled gallbladder. No evidence of acute cholecystitis or biliary duct dilatation. Electronically Signed   By: Jeronimo Greaves M.D.   On: 01/13/2023 18:32   CT HEAD WO CONTRAST ( ) Result Date: 01/13/2023 CLINICAL DATA:  Provided history: Mental status change, unknown cause. EXAM: CT HEAD WITHOUT CONTRAST TECHNIQUE: Contiguous axial images were obtained from the base of the skull through the vertex without intravenous contrast. RADIATION DOSE REDUCTION: This exam was performed according to the departmental dose-optimization program which includes automated exposure control, adjustment of the mA and/or kV according to patient size and/or use of iterative reconstruction technique. COMPARISON:  Head  CT 03/22/2018. FINDINGS: Brain: No age advanced or lobar predominant parenchymal atrophy. There is no acute intracranial hemorrhage. No demarcated cortical infarct. No extra-axial fluid collection. No evidence of an intracranial mass. No midline shift. Vascular: No hyperdense vessel. Skull: No acute calvarial fracture or aggressive osseous lesion. Right parietal cranioplasty. Sinuses/Orbits: No mass  or acute finding within the imaged orbits. Minimal mucosal thickening within the right maxillary sinus at the imaged levels. Minimal mucosal thickening, and tiny mucous retention cyst, within the left maxillary sinus at the imaged levels. Trace mucosal thickening within the right sphenoid sinus. Small-volume frothy secretions within the left sphenoid sinus. IMPRESSION: CT head: 1.  No evidence of an acute intracranial abnormality. 2. Mild paranasal sinus disease as described. Electronically Signed   By: Jackey Loge D.O.   On: 01/13/2023 15:49   DG Chest Portable 1 View Result Date: 01/13/2023 CLINICAL DATA:  Shortness of breath.  Hypoxia. EXAM: PORTABLE CHEST 1 VIEW COMPARISON:  06/10/2019. FINDINGS: Low lung volume. Note is made of elevated right hemidiaphragm. There is homogeneous opacification of bilateral lower hemi thoraces, which may be due to layering pleural effusions versus overlying soft tissue. Consider ultrasound versus lateral radiograph to attempt differentiation. Bilateral lungs are otherwise clear. No acute consolidation or lung collapse. No pulmonary edema. Bilateral costophrenic angles are clear. Stable cardio-mediastinal silhouette. No acute osseous abnormalities. The soft tissues are within normal limits. IMPRESSION: *Homogeneous opacification of bilateral lower hemi thoraces, which may be due to layering pleural effusions versus overlying soft tissue. Consider ultrasound versus lateral radiograph to attempt differentiation. Bilateral lungs are otherwise clear. No acute consolidation or lung collapse. Electronically Signed   By: Jules Schick M.D.   On: 01/13/2023 13:26      ECG & Cardiac Imaging    Sinus tachycardia @ 125 w/ LAE, and inc RBBB - personally reviewed.  Assessment & Plan    1.  Acute hypoxic resp failure:  Pt admitted 12/22 after being found unresponsive by mother.  Hypoxic w/ O2 sat in 70's req BiPAP on arrival to ED.  Imaging/presentation concerning for aspiration  PNA.  UDS + for benzos/amphetamines however pt on Jeffrey Lin and Jeffrey Lin @ home - denies intentional/accidental overdose.  Currently req high flow [place].  O2 and abx mgmt per CCM.  2.  Acute metabolic encephalopathy:  In setting of above.  Currently AAOx3.  3.  Sepsis w/ hypotension/HTN:  In setting of above.  BP now high.  Home med list has carvedilol listed, though he thinks he's on a "sartan."  Metoprolol added by primary team.  Will look to transition back to carvedilol as lungs improve.  Avoiding ARB due to AKI.  4.  Demand Ischemia vs NSTEMI:  In setting of sepsis/shock, hsTrop up to 1738, now downtrending.  Denies h/o chest pain or dyspnea with usual activities prior to admission.  Remains dyspneic and now on HFNC in setting of above.  Suspect demand ischemia.  No significant cor Ca2+ on CT chest in 2021.  Previously nl EF by echo in 2021.  F/u echo.  Further recs re: ischemic eval pending EF/wall motion.  Adding back ? blocker.  Cont asa.  Cont heparin x 48 hrs.  No statin 2/2 transaminitis.  5.  AKI:  In setting of above creat 2.61 on admission and 1.80 this AM.  Renal u/s unremarkable.  Rise in creat likely 2/2 shock.  Follow.  6.  Chronic HFpEF:  Prev nl EF.  Body habitus makes exam challenging though does not appear to  be markedly volume overloaded.  BNP only 128.2.  F/u echo.  Manage BP.  7.  Transaminitis:  In setting of above - likely shock liver 2/2 hypotension.  U/s suggestive of gallbladder stones w/o cholecystitis.  8.  Anxiety/Depression/schizoaffective d/o:  meds per CCM.  Risk Assessment/Risk Scores:     TIMI Risk Score for Unstable Angina or Non-ST Elevation MI:   The patient's TIMI risk score is 1, which indicates a 5% risk of all cause mortality, new or recurrent myocardial infarction or need for urgent revascularization in the next 14 days.      Signed, Nicolasa Ducking, NP 01/14/2023, 10:25 AM  For questions or updates, please contact   Please consult [email or web address] for  contact info under Cardiology/STEMI.

## 2023-01-15 DIAGNOSIS — J9601 Acute respiratory failure with hypoxia: Secondary | ICD-10-CM | POA: Diagnosis not present

## 2023-01-15 DIAGNOSIS — J15212 Pneumonia due to Methicillin resistant Staphylococcus aureus: Secondary | ICD-10-CM | POA: Diagnosis not present

## 2023-01-15 DIAGNOSIS — R7989 Other specified abnormal findings of blood chemistry: Secondary | ICD-10-CM | POA: Diagnosis not present

## 2023-01-15 LAB — RENAL FUNCTION PANEL
Albumin: 3.8 g/dL (ref 3.5–5.0)
Anion gap: 11 (ref 5–15)
BUN: 25 mg/dL — ABNORMAL HIGH (ref 6–20)
CO2: 32 mmol/L (ref 22–32)
Calcium: 9 mg/dL (ref 8.9–10.3)
Chloride: 94 mmol/L — ABNORMAL LOW (ref 98–111)
Creatinine, Ser: 1.16 mg/dL (ref 0.61–1.24)
GFR, Estimated: >60 mL/min (ref >60)
Glucose, Bld: 123 mg/dL — ABNORMAL HIGH (ref 70–99)
Phosphorus: 2.1 mg/dL — ABNORMAL LOW (ref 2.5–4.6)
Potassium: 3.9 mmol/L (ref 3.5–5.1)
Sodium: 137 mmol/L (ref 135–145)

## 2023-01-15 LAB — CBC
HCT: 46.5 % (ref 39.0–52.0)
Hemoglobin: 15.9 g/dL (ref 13.0–17.0)
MCH: 31.4 pg (ref 26.0–34.0)
MCHC: 34.2 g/dL (ref 30.0–36.0)
MCV: 91.7 fL (ref 80.0–100.0)
Platelets: 195 10*3/uL (ref 150–400)
RBC: 5.07 MIL/uL (ref 4.22–5.81)
RDW: 13.2 % (ref 11.5–15.5)
WBC: 21.4 10*3/uL — ABNORMAL HIGH (ref 4.0–10.5)
nRBC: 0 % (ref 0.0–0.2)

## 2023-01-15 LAB — HEPATIC FUNCTION PANEL
ALT: 902 U/L — ABNORMAL HIGH (ref 0–44)
AST: 318 U/L — ABNORMAL HIGH (ref 15–41)
Albumin: 4 g/dL (ref 3.5–5.0)
Alkaline Phosphatase: 87 U/L (ref 38–126)
Bilirubin, Direct: 0.1 mg/dL (ref 0.0–0.2)
Indirect Bilirubin: 0.7 mg/dL (ref 0.3–0.9)
Total Bilirubin: 0.8 mg/dL (ref <1.2)
Total Protein: 7.2 g/dL (ref 6.5–8.1)

## 2023-01-15 LAB — HEPARIN LEVEL (UNFRACTIONATED)
Heparin Unfractionated: 0.32 [IU]/mL (ref 0.30–0.70)
Heparin Unfractionated: 0.36 [IU]/mL (ref 0.30–0.70)

## 2023-01-15 LAB — MAGNESIUM: Magnesium: 2 mg/dL (ref 1.7–2.4)

## 2023-01-15 LAB — PROCALCITONIN: Procalcitonin: 0.51 ng/mL

## 2023-01-15 LAB — VANCOMYCIN, RANDOM: Vancomycin Rm: 12 ug/mL

## 2023-01-15 MED ORDER — HEPARIN SODIUM (PORCINE) 5000 UNIT/ML IJ SOLN
5000.0000 [IU] | Freq: Three times a day (TID) | INTRAMUSCULAR | Status: DC
  Administered 2023-01-15 – 2023-01-17 (×5): 5000 [IU] via SUBCUTANEOUS
  Filled 2023-01-15 (×5): qty 1

## 2023-01-15 MED ORDER — K PHOS MONO-SOD PHOS DI & MONO 155-852-130 MG PO TABS
500.0000 mg | ORAL_TABLET | ORAL | Status: DC
  Filled 2023-01-15 (×2): qty 2

## 2023-01-15 MED ORDER — POTASSIUM PHOSPHATE MONOBASIC 500 MG PO TABS
500.0000 mg | ORAL_TABLET | ORAL | Status: DC
Start: 2023-01-15 — End: 2023-01-15

## 2023-01-15 MED ORDER — VANCOMYCIN HCL 1.5 G IV SOLR
1500.0000 mg | Freq: Two times a day (BID) | INTRAVENOUS | Status: DC
  Administered 2023-01-15 – 2023-01-16 (×3): 1500 mg via INTRAVENOUS
  Filled 2023-01-15: qty 30
  Filled 2023-01-15: qty 1500
  Filled 2023-01-15: qty 30
  Filled 2023-01-15: qty 1500

## 2023-01-15 MED ORDER — POTASSIUM & SODIUM PHOSPHATES 280-160-250 MG PO PACK
500.0000 mg | PACK | ORAL | Status: AC
  Administered 2023-01-15 (×2): 500 mg via ORAL
  Filled 2023-01-15 (×2): qty 2

## 2023-01-15 MED ORDER — VANCOMYCIN HCL 1500 MG/300ML IV SOLN
1500.0000 mg | Freq: Two times a day (BID) | INTRAVENOUS | Status: DC
  Filled 2023-01-15: qty 300

## 2023-01-15 MED ORDER — HEPARIN (PORCINE) 25000 UT/250ML-% IV SOLN
1800.0000 [IU]/h | INTRAVENOUS | Status: AC
  Filled 2023-01-15: qty 250

## 2023-01-15 MED ORDER — CLONIDINE HCL 0.1 MG PO TABS
0.1000 mg | ORAL_TABLET | Freq: Two times a day (BID) | ORAL | Status: DC
Start: 2023-01-15 — End: 2023-01-16
  Administered 2023-01-15 – 2023-01-16 (×3): 0.1 mg via ORAL
  Filled 2023-01-15 (×3): qty 1

## 2023-01-15 MED ORDER — POTASSIUM PHOSPHATE MONOBASIC 500 MG PO TABS
500.0000 mg | ORAL_TABLET | ORAL | Status: DC
  Filled 2023-01-15: qty 1

## 2023-01-15 MED ORDER — SODIUM CHLORIDE 0.9 % IV SOLN: INTRAVENOUS | Status: AC | PRN

## 2023-01-15 NOTE — Progress Notes (Signed)
NAME:  Jeffrey Lin, MRN:  [id number], DOB:  1975/11/18, LOS: 2 ADMISSION DATE:  01/13/2023 History of Present Illness:  Mr. Lorona is a 47 year old male patient with a past medical history of anxiety, depression, chronic pain, schizoaffective disorder and hypertension who presented to [facility] on 01/13/2023 with acute hypoxic respiratory failure. He was found down at home with sats in the 70s brought in initially thought to be secondary to opioid overdose and was given Narcan. He improved on BiPAP and was started on Unasyn for aspiration pneumonia. He was also noted to have adequate stated troponin peaked at 1700 without EKG changes and without any s complaints of chest pain. Started on heparin drip aspirin and metoprolol 12.5 twice daily. He continued to be hypoxic presenting today for a CT chest without contrast which showed a large right lower lobe pneumonia. MRSA swab positive. Antibiotics broadened to vancomycin and Zosyn and azithromycin. Utox negative for opioids and only positive for bzd and amphetamines.   Pertinent  Medical History  As above  Significant Hospital Events: Including procedures, antibiotic start and stop dates in addition to other pertinent events   -- 12/23 admitted to the ICU  -- 12/24 CT chest with large RLL consolidation. MRSA positive. Abx broadened.,   Interim History / Subjective:  No major events overnight. Improving from a respiratory standpoint. Remains on HFNC at 60% FiO2.   Objective   Blood pressure 128/87, pulse (!) 104, temperature 98.2 F (36.8 C), temperature source Oral, resp. rate 19, height 6\' 5"  (1.956 m), weight 115.7 kg, SpO2 93%.    FiO2 (%):  [57 %-100 %] 57 %   Intake/Output Summary (Last 24 hours) at 01/15/2023 0814 Last data filed at 01/15/2023 0805 Gross per 24 hour  Intake 1738.72 ml  Output 3650 ml  Net -1911.28 ml   Filed Weights   01/13/23 1751 01/14/23 0900 01/15/23 0233  Weight: (!) 140.1 kg 116.5 kg 115.7 kg     Examination: General: Looks comfortable in no acute distress  HENT: Supple neck reactive pupils  Lungs: Faint wheezing heard bilaterally Cardiovascular: Normal S1, Normal S2 RRR Abdomen: Soft nom tender, non distended, +BS  Extremities: Warm and well perfused   Labs and imaging were reviewed.   AST ALT improving. Kidney function back to baseline. Procalc 0.51. WBC continues to trend up now at 21.   Echocardiogram 12/24 - LVEF borderline low 50%. Unable to evaluate RWMA. No significant valvular disease.   Assessment & Plan:  Case of a 47 year old male patient with a past medical history of schizoaffective disorder, tobacco use disorder presenting to [facility] on 12/23 with acute hypoxic respiratory failure secondary to community-acquired pneumonia. MRSA positive. Course complicated by AKI in the setting of sepsis baseline 1.0 mg/dl currently improving at 1.8mg /dl (and demand ischemia. Course also complicated by hepatitis possibly shock liver.   #Acute hypoxic respiratory failure in the setting of...  #CAP (MRSA +) CT chest with large RLL consolidation. Urine strep -,  #Likely also undiagnosed COPD with exacerbation.  #AKI (Improved)  #Hepatitis likely secondary to prolonged hypoxia Improving.  #Worsening leukocytosis likely in the setting of steroids initiation.   Neuro: c/w home Xanax 1 bid, Adderal 10mg  TID, and Gabapentin 300mg  TID. These are less than his home dose and we will adjust on a daily basis.  [facility]: Nicardipine drip for SBP < 140. Start Metoprolol 12.5mg  BID. Lasix 40mg  x1.Aspirin 81mg  daily. Will likely add Amlodipine to his regimen. D/c Heparin drip at 48hrs. Lungs: Vanc, zosyn, Azithrom  for a total of 5 days.Marland Kitchen Sputum culture if able. Methylpred 40mg  IV daily. Duonebs Q4H schedule. X1 Albuterol 5mg  for 1hr. Goal SpO2 88-92%.   GI: Diet placed. Px not indicated.   Renal: Trend Cr daily.  Best Practice (right click and "Reselect all SmartList Selections" daily)   Diet/type:  Regular consistency (see orders) DVT prophylaxis systemic heparin Pressure ulcer(s): N/A GI prophylaxis: N/A Lines: N/A Foley:  Yes, and it is still needed Code Status:  full code Last date of multidisciplinary goals of care discussion [01/15/2023]  I spent 62 minutes caring for this patient today, including preparing to see the patient, obtaining a medical history , reviewing a separately obtained history, performing a medically appropriate examination and/or evaluation, ordering medications, tests, or procedures, documenting clinical information in the electronic health record, and independently interpreting results (not separately reported/billed) and communicating results to the patient/family/caregiver  Janann Colonel, MD [facility] 01/15/2023 8:37 AM

## 2023-01-15 NOTE — Progress Notes (Signed)
Progress Note  Patient Name: Jeffrey Lin Date of Encounter: 01/15/2023  Primary Cardiologist: Kirke Corin  Subjective   No chest pain. Denies dyspnea, remains on HFNC. Echo with low normal LVSF with an EF of 50-55%, not optimally defined for wall motion (given Definity).  Inpatient Medications    Scheduled Meds:  ALPRAZolam  1 mg Oral BID   amphetamine-dextroamphetamine  10 mg Oral TID AC   aspirin EC  81 mg Oral Daily   Chlorhexidine Gluconate Cloth  6 each Topical Daily   gabapentin  300 mg Oral TID   ipratropium-albuterol  3 mL Nebulization Q4H   methylPREDNISolone (SOLU-MEDROL) injection  40 mg Intravenous Daily   metoprolol tartrate  12.5 mg Oral BID   potassium phosphate (monobasic)  500 mg Oral Q4H   vancomycin variable dose per unstable renal function (pharmacist dosing)   Does not apply See admin instructions   Continuous Infusions:  azithromycin Stopped (01/14/23 1409)   heparin     niCARDipine Stopped (01/15/23 1610)   piperacillin-tazobactam (ZOSYN)  IV 12.5 mL/hr at 01/15/23 0659   Vancomycin (VANCOCIN) 1,500 mg in sodium chloride 0.9 % 500 mL IVPB     PRN Meds: ALPRAZolam, docusate sodium, polyethylene glycol   Vital Signs    Vitals:   01/15/23 0400 01/15/23 0500 01/15/23 0600 01/15/23 0757  BP: (!) 137/91 128/87    Pulse:      Resp: 17 19 19    Temp: 98.2 F (36.8 C)     TempSrc: Oral     SpO2: 98%   93%  Weight:      Height:        Intake/Output Summary (Last 24 hours) at 01/15/2023 0907 Last data filed at 01/15/2023 0805 Gross per 24 hour  Intake 1738.72 ml  Output 3650 ml  Net -1911.28 ml   Filed Weights   01/13/23 1751 01/14/23 0900 01/15/23 0233  Weight: (!) 140.1 kg 116.5 kg 115.7 kg    Telemetry    SR with sinus tachycardia, 90s to low 100s bpm - Personally Reviewed  ECG    No new tracings - Personally Reviewed  Physical Exam   GEN: No acute distress.   Neck: JVD difficult to assess secondary to body habitus. Cardiac:  RRR, no murmurs, rubs, or gallops.  Respiratory: Coarse breath sounds with rhonchi bilaterally with diminished breath sounds along the right base. On HFNC.  GI: Soft, nontender, non-distended.   MS: No edema; No deformity. Neuro:  Alert and oriented x 3; Nonfocal.  Psych: Normal affect.  Labs    Chemistry Recent Labs  Lab 01/13/23 1236 01/14/23 0126 01/14/23 0715 01/15/23 0350 01/15/23 0352  NA 137 136  --   --  137  K 4.1 4.3  --   --  3.9  CL 94* 97*  --   --  94*  CO2 28 31  --   --  32  GLUCOSE 145* 141*  --   --  123*  BUN 19 24*  --   --  25*  CREATININE 2.61* 1.80*  --   --  1.16  CALCIUM 9.4 8.7*  --   --  9.0  PROT 7.8  --  6.8 7.2  --   ALBUMIN 3.9 3.5 3.5 4.0 3.8  AST 176*  --  665* 318*  --   ALT 202*  --  952* 902*  --   ALKPHOS 111  --  86 87  --   BILITOT 0.8  --  0.5  0.8  --   GFRNONAA 30* 46*  --   --  >60  ANIONGAP 15 8  --   --  11     Hematology Recent Labs  Lab 01/13/23 1236 01/14/23 0126 01/15/23 0350  WBC 17.6* 18.1* 21.4*  RBC 5.51 5.20 5.07  HGB 17.8* 16.3 15.9  HCT 53.5* 49.8 46.5  MCV 97.1 95.8 91.7  MCH 32.3 31.3 31.4  MCHC 33.3 32.7 34.2  RDW 13.0 13.2 13.2  PLT 207 171 195    Cardiac EnzymesNo results for input(s): "TROPONINI" in the last 168 hours. No results for input(s): "TROPIPOC" in the last 168 hours.   BNP Recent Labs  Lab 01/13/23 1236  BNP 128.2*     DDimer No results for input(s): "DDIMER" in the last 168 hours.   Radiology    CT CHEST WO CONTRAST Result Date: 01/14/2023 IMPRESSION: Large right lower lobe airspace opacity is noted most consistent with pneumonia. Small patchy airspace opacities are noted anteriorly in left upper lobe concerning for pneumonia. Mild subsegmental atelectasis or infiltrates are noted posteriorly in both upper lobes. Cholelithiasis. Electronically Signed   By: Lupita Raider M.D.   On: 01/14/2023 12:17   DG Chest Port 1 View Result Date: 01/14/2023 IMPRESSION: 1. Small bilateral  layering pleural effusions. 2. Increased opacity in the lateral left base which could be atelectasis or consolidation. 3. Elevated right hemidiaphragm which could obscure consolidation. 4. Mild cardiomegaly without evidence of CHF. 5. Aortic atherosclerosis. Electronically Signed   By: Almira Bar M.D.   On: 01/14/2023 05:03   US RENAL Result Date: 01/13/2023 IMPRESSION: Unremarkable renal ultrasound. Electronically Signed   By: Aram Candela M.D.   On: 01/13/2023 18:33   US Abdomen Limited RUQ (LIVER/GB) Result Date: 01/13/2023 IMPRESSION: "Wall echo shadow" sign, most typically seen with stone filled gallbladder. No evidence of acute cholecystitis or biliary duct dilatation. Electronically Signed   By: Jeronimo Greaves M.D.   On: 01/13/2023 18:32   CT HEAD WO CONTRAST ( ) Result Date: 01/13/2023 IMPRESSION: CT head: 1.  No evidence of an acute intracranial abnormality. 2. Mild paranasal sinus disease as described. Electronically Signed   By: Jackey Loge D.O.   On: 01/13/2023 15:49   DG Chest Portable 1 View Result Date: 01/13/2023 IMPRESSION: *Homogeneous opacification of bilateral lower hemi thoraces, which may be due to layering pleural effusions versus overlying soft tissue. Consider ultrasound versus lateral radiograph to attempt differentiation. Bilateral lungs are otherwise clear. No acute consolidation or lung collapse. Electronically Signed   By: Jules Schick M.D.   On: 01/13/2023 13:26    Cardiac Studies   2D echo 01/14/2023: 1. Left ventricular ejection fraction, by estimation, is 50 to 55%. The  left ventricle has low normal function. Left ventricular endocardial  border not optimally defined to evaluate regional wall motion. There is  mild left ventricular hypertrophy. Left  ventricular diastolic parameters are consistent with Grade I diastolic  dysfunction (impaired relaxation).   2. Right ventricular systolic function is normal. The right ventricular  size is  normal.   3. The mitral valve is normal in structure. No evidence of mitral valve  regurgitation. No evidence of mitral stenosis.   4. The aortic valve is normal in structure. Aortic valve regurgitation is  not visualized. No aortic stenosis is present.   5. The inferior vena cava is normal in size with greater than 50%  respiratory variability, suggesting right atrial pressure of 3 mmHg.   Patient Profile  47 y.o. male with history of anxiety, depression, chronic pain, schizoaffective d/o, HTN, and obesity, who presented to [facility] on 01/13/2023 with acute hypoxic respiratory failure. He was found down at home with sats in the 70s brought in initially thought to be secondary to opioid overdose and was given Narcan, found to have RLL PNA and possible AECOPD, who is being seen today for the evaluation of elevated troponin at the request of Dr. Larinda Buttery.   Assessment & Plan    1. Demand Ischemia vs NSTEMI:  In setting of sepsis/shock, hsTrop up to 1738, now downtrending.  Denies h/o chest pain or dyspnea with usual activities prior to admission.  Remains dyspneic and now on HFNC in setting of above.  Suspect demand ischemia.  No significant cor Ca2+ on CT chest in 2021.  Echo this admission with low-normal LV systolic function, unable to evaluate wall motion.  Continue ? blocker and asa.  Cont heparin x 48 hrs.  No statin 2/2 transaminitis. Plan for ischemic evaluation once improved from acute illness.  2. Acute hypoxic resp failure with acute metabolic encephalopathy in the setting of RLLL PNA (MRSA+):  Pt admitted 12/22 after being found unresponsive by mother.  Hypoxic w/ O2 sat in 70's req BiPAP on arrival to ED.  Imaging/presentation concerning for PNA.  UDS + for benzos/amphetamines however pt on xanax and adderal @ home - denies intentional/accidental overdose.  Currently req high flow [place].  O2 and abx mgmt per CCM.  3. AKI: -Resolved   Remaining per primary service          For  questions or updates, please contact [facility] Please consult [email or web address] for contact info under Cardiology/STEMI.    Signed, Eula Listen, PA-C [facility] Pager: [phone] 01/15/2023, 9:07 AM

## 2023-01-15 NOTE — Progress Notes (Signed)
Pharmacy Antibiotic Note  Jeffrey Lin is a 47 y.o. male w/ PMH of  PMH significant for anxiety, chronic pain, schizoaffective disorder, hypertension admitted on 01/13/2023 with a drug overdose.  Pharmacy has been consulted for Zosyn dosing ISO suspected aspiration PNA. Serum creatinine notably elevated above apparent baseline level   Plan:  Continue pip/tazo 3.375 g q8H.   Pt received vancomycin 2000 mg x 1. Scr improved. Will start vancomycin 1500 mg q12H. Predicted AUC of 411. Goal AUC of 400-600. Plan to obtain vancomycin level after 4th or 5th dose.   Height: 6\' 5"  (195.6 cm) Weight: 115.7 kg (255 lb 1.2 oz) IBW/kg (Calculated) : 89.1  Temp (24hrs), Avg:98.3 F (36.8 C), Min:97.6 F (36.4 C), Max:99 F (37.2 C)  Recent Labs  Lab 01/13/23 1236 01/13/23 1438 01/13/23 1654 01/14/23 0126 01/14/23 0726 01/14/23 0927 01/15/23 0350 01/15/23 0352  WBC 17.6*  --   --  18.1*  --   --  21.4*  --   CREATININE 2.61*  --   --  1.80*  --   --   --  1.16  LATICACIDVEN  --  2.7* 2.2*  --  1.6 2.0*  --   --   VANCORANDOM  --   --   --   --   --   --  12  --     Estimated Creatinine Clearance: 112.2 mL/min (by C-G formula based on SCr of 1.16 mg/dL).    Allergies  Allergen Reactions   Toradol [Ketorolac Tromethamine] Swelling   Lamotrigine Rash    Antimicrobials this admission: 12/23 Unasyn >> 12/24 12/24 azithromycin >> 12/24 Zosyn >> 12/24 vancomycin >>  Microbiology results: 12/23 BCx: NGTD 12/24 MRSA PCR (+)  Thank you for allowing pharmacy to be a part of this patient's care.  Ronnald Ramp 01/15/2023 8:00 AM

## 2023-01-15 NOTE — Consult Note (Signed)
PHARMACY - ANTICOAGULATION CONSULT NOTE  Pharmacy Consult for IV Heparin Indication: chest pain/ACS  Patient Measurements: Height: 6\' 5"  (195.6 cm) Weight: 115.7 kg (255 lb 1.2 oz) IBW/kg (Calculated) : 89.1 Heparin Dosing Weight: 115 kg  Labs: Recent Labs    01/13/23 1236 01/13/23 1236 01/13/23 1438 01/13/23 1825 01/14/23 0126 01/14/23 0715 01/14/23 0927 01/14/23 1446 01/14/23 2046 01/15/23 0350  HGB 17.8*  --   --   --  16.3  --   --   --   --  15.9  HCT 53.5*  --   --   --  49.8  --   --   --   --  46.5  PLT 207  --   --   --  171  --   --   --   --  195  APTT  --   --   --  28  --   --   --   --   --   --   LABPROT  --   --   --  15.6*  --   --   --   --   --   --   INR  --   --   --  1.2  --   --   --   --   --   --   HEPARINUNFRC  --    < >  --   --  0.30 0.29*  --  0.43 0.24* 0.32  CREATININE 2.61*  --   --   --  1.80*  --   --   --   --   --   CKTOTAL 162  --   --   --   --   --   --   --   --   --   TROPONINIHS 531*  --    < > 1,738*  --  1,166* 1,132*  --   --   --    < > = values in this interval not displayed.    Estimated Creatinine Clearance: 72.3 mL/min (A) (by C-G formula based on SCr of 1.8 mg/dL (H)).  Medical History: Past Medical History:  Diagnosis Date   Anxiety    Chronic heart failure with preserved ejection fraction (HFpEF) (HCC)    a. 02/2016 Echo: EF 55-60%; b. 04/2019 Echo: EF 60-65%, no rwma, mod reduced RV fxn, mod dil RA; c. 07/2019 RHC: RA 2, RV 39/1, PA 39/15, PCWP 7, CO 9.1, CI 3.4.   Chronic leg pain    Depression    Lower extremity cellulitis    Morbid obesity (HCC)    Primary hypertension    Schizoaffective disorder (HCC)    Tobacco abuse    Medications:  No anticoagulation prior to admission per my chart review  Assessment: 47 y/o M with medical history as above presenting with acute respiratory secondary to presumed drug overdose / aspiration event. Troponin elevated and trending up concerning for acute coronary syndrome.  Pharmacy consulted to initiate and manage heparin infusion.  Baseline aPTT and INR are pending. Baseline CBC notable for polycythemia / elevated HCT.  Goal of Therapy:  Heparin level 0.3-0.7 units/ml Monitor platelets by anticoagulation protocol: Yes  12/24 0126 HL 0.30, therapeutic x 1 12/24 0715 HL 0.29, subtherapeutic 12/24 1446 HL 0.43, therapeutic x1 12/24 2046 HL 0.24, subtherapeutic 12/25 0350 HL 0.32, therapeutic x 1   Plan:  Continue heparin infusion at 1800 units/hour Recheck heparin level in 6 hours to  confirm Monitor daily heparin levels while on heparin infusion Monitor CBC and signs/symptoms of bleeding  Otelia Sergeant, PharmD, [facility] 01/15/2023 5:41 AM

## 2023-01-15 NOTE — Consult Note (Signed)
PHARMACY CONSULT NOTE - ELECTROLYTES  Pharmacy Consult for Electrolyte Monitoring and Replacement   Recent Labs: Potassium (mmol/L)  Date Value  01/15/2023 3.9  09/11/2013 3.6   Magnesium (mg/dL)  Date Value  16/10/9602 2.0  09/11/2013 2.1   Calcium (mg/dL)  Date Value  54/09/8117 9.0   Calcium, Total (mg/dL)  Date Value  14/78/2956 8.0 (L)   Albumin (g/dL)  Date Value  21/30/8657 3.8  09/09/2013 4.6   Albumin Serum (g/dL)  Date Value  84/69/6295 4.5   Phosphorus (mg/dL)  Date Value  28/41/3244 2.1 (L)   Sodium (mmol/L)  Date Value  01/15/2023 137  07/30/2019 140  09/11/2013 145   Height: 6\' 5"  (195.6 cm) Weight: 115.7 kg (255 lb 1.2 oz) IBW/kg (Calculated) : 89.1 Estimated Creatinine Clearance: 112.2 mL/min (by C-G formula based on SCr of 1.16 mg/dL).  Assessment  Jeffrey Lin is a 47 y.o. male presenting with drug overdose. PMH significant for anxiety, chronic pain, schizoaffective disorder, hypertension . Pharmacy has been consulted to monitor and replace electrolytes.  MIVF: N/A Pertinent medications: N/A  Goal of Therapy: Electrolytes within normal limits  Plan:  Kphos 2 tabs x 2 F/u with AM labs.    Thank you for allowing pharmacy to be a part of this patient's care.  Ronnald Ramp 01/15/2023 7:56 AM

## 2023-01-15 NOTE — Plan of Care (Signed)
  Problem: Health Behavior/Discharge Planning: Goal: Ability to manage health-related needs will improve Outcome: Progressing   Problem: Clinical Measurements: Goal: Ability to maintain clinical measurements within normal limits will improve Outcome: Progressing Goal: Will remain free from infection Outcome: Progressing Goal: Diagnostic test results will improve Outcome: Progressing Goal: Respiratory complications will improve Outcome: Progressing Goal: Cardiovascular complication will be avoided Outcome: Progressing   Problem: Activity: Goal: Risk for activity intolerance will decrease Outcome: Progressing   Problem: Nutrition: Goal: Adequate nutrition will be maintained Outcome: Progressing   Problem: Coping: Goal: Level of anxiety will decrease Outcome: Progressing   Problem: Elimination: Goal: Will not experience complications related to bowel motility Outcome: Progressing Goal: Will not experience complications related to urinary retention Outcome: Progressing   Problem: Pain Management: Goal: General experience of comfort will improve Outcome: Progressing   Problem: Safety: Goal: Ability to remain free from injury will improve Outcome: Progressing   Problem: Skin Integrity: Goal: Risk for impaired skin integrity will decrease Outcome: Progressing   Problem: Fluid Volume: Goal: Hemodynamic stability will improve Outcome: Progressing   Problem: Clinical Measurements: Goal: Diagnostic test results will improve Outcome: Progressing Goal: Signs and symptoms of infection will decrease Outcome: Progressing   Problem: Respiratory: Goal: Ability to maintain adequate ventilation will improve Outcome: Progressing   Problem: Activity: Goal: Ability to tolerate increased activity will improve Outcome: Progressing   Problem: Clinical Measurements: Goal: Ability to maintain a body temperature in the normal range will improve Outcome: Progressing   Problem:  Respiratory: Goal: Ability to maintain adequate ventilation will improve Outcome: Progressing Goal: Ability to maintain a clear airway will improve Outcome: Progressing

## 2023-01-15 NOTE — Consult Note (Signed)
PHARMACY - ANTICOAGULATION CONSULT NOTE  Pharmacy Consult for IV Heparin Indication: chest pain/ACS  Patient Measurements: Height: 6\' 5"  (195.6 cm) Weight: 115.7 kg (255 lb 1.2 oz) IBW/kg (Calculated) : 89.1 Heparin Dosing Weight: 115 kg  Labs: Recent Labs    01/13/23 1236 01/13/23 1236 01/13/23 1438 01/13/23 1825 01/14/23 0126 01/14/23 0715 01/14/23 0927 01/14/23 1446 01/14/23 2046 01/15/23 0350 01/15/23 0352 01/15/23 0942  HGB 17.8*  --   --   --  16.3  --   --   --   --  15.9  --   --   HCT 53.5*  --   --   --  49.8  --   --   --   --  46.5  --   --   PLT 207  --   --   --  171  --   --   --   --  195  --   --   APTT  --   --   --  28  --   --   --   --   --   --   --   --   LABPROT  --   --   --  15.6*  --   --   --   --   --   --   --   --   INR  --   --   --  1.2  --   --   --   --   --   --   --   --   HEPARINUNFRC  --    < >  --   --  0.30 0.29*  --    < > 0.24* 0.32  --  0.36  CREATININE 2.61*  --   --   --  1.80*  --   --   --   --   --  1.16  --   CKTOTAL 162  --   --   --   --   --   --   --   --   --   --   --   TROPONINIHS 531*  --    < > 1,738*  --  1,166* 1,132*  --   --   --   --   --    < > = values in this interval not displayed.    Estimated Creatinine Clearance: 112.2 mL/min (by C-G formula based on SCr of 1.16 mg/dL).  Medical History: Past Medical History:  Diagnosis Date   Anxiety    Chronic heart failure with preserved ejection fraction (HFpEF) (HCC)    a. 02/2016 Echo: EF 55-60%; b. 04/2019 Echo: EF 60-65%, no rwma, mod reduced RV fxn, mod dil RA; c. 07/2019 RHC: RA 2, RV 39/1, PA 39/15, PCWP 7, CO 9.1, CI 3.4.   Chronic leg pain    Depression    Lower extremity cellulitis    Morbid obesity (HCC)    Primary hypertension    Schizoaffective disorder (HCC)    Tobacco abuse    Medications:  No anticoagulation prior to admission per my chart review  Assessment: 47 y/o M with medical history as above presenting with acute respiratory  secondary to presumed drug overdose / aspiration event. Troponin elevated and trending up concerning for acute coronary syndrome. Pharmacy consulted to initiate and manage heparin infusion.   Goal of Therapy:  Heparin level 0.3-0.7 units/ml Monitor platelets by anticoagulation protocol: Yes  12/24 0126 HL 0.30, therapeutic x 1 12/24 0715 HL 0.29, subtherapeutic 12/24 1446 HL 0.43, therapeutic x1 12/24 2046 HL 0.24, subtherapeutic 12/25 0350 HL 0.32, therapeutic x 1 12/25 0942 HL 0.36, therapeutic x 2    Plan:  Heparin level is therapeutic. Will continue heparin infusion at 1800 units/hr. Recheck heparin level and CBC with AM labs.   Paschal Dopp, PharmD, 01/15/2023 10:13 AM

## 2023-01-16 DIAGNOSIS — J15212 Pneumonia due to Methicillin resistant Staphylococcus aureus: Secondary | ICD-10-CM | POA: Diagnosis not present

## 2023-01-16 LAB — BLOOD GAS, VENOUS
Bicarbonate: 32.6 mmol/L — ABNORMAL HIGH (ref 20.0–28.0)
O2 Saturation: 50.2 mmol/L — ABNORMAL HIGH (ref 0.0–2.0)
Patient temperature: 37
Patient temperature: 50.2 %
pCO2, Ven: 71 mm[Hg] (ref 44–60)
pH, Ven: 7.27 (ref 7.25–7.43)
pO2, Ven: 32.6 mmol/L — ABNORMAL HIGH (ref 32–45)

## 2023-01-16 LAB — RENAL FUNCTION PANEL
Albumin: 3.7 g/dL (ref 3.5–5.0)
Anion gap: 9 (ref 5–15)
BUN: 22 mg/dL — ABNORMAL HIGH (ref 6–20)
CO2: 27 mmol/L (ref 22–32)
Calcium: 8.8 mg/dL — ABNORMAL LOW (ref 8.9–10.3)
Chloride: 95 mmol/L — ABNORMAL LOW (ref 98–111)
Creatinine, Ser: 0.97 mg/dL (ref 0.61–1.24)
GFR, Estimated: >60 mL/min (ref >60)
Glucose, Bld: 127 mg/dL — ABNORMAL HIGH (ref 70–99)
Phosphorus: 2.6 mg/dL (ref 2.5–4.6)
Potassium: 4 mmol/L (ref 3.5–5.1)
Sodium: 131 mmol/L — ABNORMAL LOW (ref 135–145)

## 2023-01-16 LAB — HEPATIC FUNCTION PANEL
ALT: 757 U/L — ABNORMAL HIGH (ref 0–44)
AST: 162 U/L — ABNORMAL HIGH (ref 15–41)
Albumin: 3.8 g/dL (ref 3.5–5.0)
Alkaline Phosphatase: 79 U/L (ref 38–126)
Bilirubin, Direct: 0.2 mg/dL (ref 0.0–0.2)
Indirect Bilirubin: 0.9 mg/dL (ref 0.3–0.9)
Total Bilirubin: 1.1 mg/dL (ref <1.2)
Total Protein: 7.1 g/dL (ref 6.5–8.1)

## 2023-01-16 LAB — CBC
HCT: 45.9 % (ref 39.0–52.0)
Hemoglobin: 15.6 g/dL (ref 13.0–17.0)
MCH: 30.8 pg (ref 26.0–34.0)
MCHC: 34 g/dL (ref 30.0–36.0)
MCV: 90.5 fL (ref 80.0–100.0)
Platelets: 198 10*3/uL (ref 150–400)
RBC: 5.07 MIL/uL (ref 4.22–5.81)
RDW: 13.2 % (ref 11.5–15.5)
WBC: 15.5 10*3/uL — ABNORMAL HIGH (ref 4.0–10.5)
nRBC: 0 % (ref 0.0–0.2)

## 2023-01-16 LAB — PROCALCITONIN: Procalcitonin: 0.26 ng/mL

## 2023-01-16 MED ORDER — CLONIDINE HCL 0.1 MG PO TABS
0.2000 mg | ORAL_TABLET | Freq: Two times a day (BID) | ORAL | Status: DC
Start: 2023-01-16 — End: 2023-01-17
  Administered 2023-01-16 – 2023-01-17 (×2): 0.2 mg via ORAL
  Filled 2023-01-16 (×2): qty 2

## 2023-01-16 MED ORDER — RISPERIDONE 1 MG PO TABS
2.0000 mg | ORAL_TABLET | Freq: Every day | ORAL | Status: DC
Start: 2023-01-16 — End: 2023-01-16
  Filled 2023-01-16: qty 2

## 2023-01-16 MED ORDER — SODIUM CHLORIDE 3 % IN NEBU
4.0000 mL | INHALATION_SOLUTION | Freq: Three times a day (TID) | RESPIRATORY_TRACT | Status: DC
  Administered 2023-01-16 – 2023-01-17 (×2): 4 mL via RESPIRATORY_TRACT
  Filled 2023-01-16 (×6): qty 4

## 2023-01-16 MED ORDER — VANCOMYCIN HCL 750 MG/150ML IV SOLN
750.0000 mg | Freq: Two times a day (BID) | INTRAVENOUS | Status: DC
  Administered 2023-01-16: 750 mg via INTRAVENOUS
  Filled 2023-01-16 (×2): qty 150

## 2023-01-16 MED ORDER — RISPERIDONE 3 MG PO TABS
3.0000 mg | ORAL_TABLET | Freq: Every day | ORAL | Status: DC
  Administered 2023-01-16: 3 mg via ORAL
  Filled 2023-01-16: qty 1

## 2023-01-16 MED ORDER — RISPERIDONE 1 MG PO TABS
2.0000 mg | ORAL_TABLET | Freq: Once | ORAL | Status: AC
Start: 2023-01-16 — End: 2023-01-16
  Administered 2023-01-16: 2 mg via ORAL
  Filled 2023-01-16: qty 2

## 2023-01-16 MED ORDER — VANCOMYCIN HCL IN DEXTROSE 1-5 GM/200ML-% IV SOLN
1000.0000 mg | Freq: Two times a day (BID) | INTRAVENOUS | Status: DC
Start: 2023-01-16 — End: 2023-01-17
  Administered 2023-01-16: 1000 mg via INTRAVENOUS
  Filled 2023-01-16 (×3): qty 200

## 2023-01-16 MED ORDER — PREDNISONE 20 MG PO TABS: 20.0000 mg | ORAL_TABLET | Freq: Every day | ORAL | Status: DC

## 2023-01-16 MED ORDER — GABAPENTIN 300 MG PO CAPS
600.0000 mg | ORAL_CAPSULE | Freq: Three times a day (TID) | ORAL | Status: DC
  Administered 2023-01-16 – 2023-01-17 (×3): 600 mg via ORAL
  Filled 2023-01-16 (×3): qty 2

## 2023-01-16 MED ORDER — IPRATROPIUM-ALBUTEROL 0.5-2.5 (3) MG/3ML IN SOLN
3.0000 mL | Freq: Four times a day (QID) | RESPIRATORY_TRACT | Status: DC
Start: 2023-01-16 — End: 2023-01-17
  Administered 2023-01-16 – 2023-01-17 (×4): 3 mL via RESPIRATORY_TRACT
  Filled 2023-01-16 (×4): qty 3

## 2023-01-16 MED ORDER — AMLODIPINE BESYLATE 5 MG PO TABS
5.0000 mg | ORAL_TABLET | Freq: Every day | ORAL | Status: DC
  Administered 2023-01-16 – 2023-01-17 (×2): 5 mg via ORAL
  Filled 2023-01-16 (×2): qty 1

## 2023-01-16 MED ORDER — PREDNISONE 10 MG PO TABS: 10.0000 mg | ORAL_TABLET | Freq: Every day | ORAL | Status: DC

## 2023-01-16 MED ORDER — TRAZODONE HCL 50 MG PO TABS
50.0000 mg | ORAL_TABLET | Freq: Every day | ORAL | Status: DC
  Administered 2023-01-16: 50 mg via ORAL
  Filled 2023-01-16 (×2): qty 1

## 2023-01-16 MED ORDER — PREDNISONE 20 MG PO TABS
30.0000 mg | ORAL_TABLET | Freq: Every day | ORAL | Status: DC
  Administered 2023-01-17: 30 mg via ORAL
  Filled 2023-01-16: qty 1

## 2023-01-16 MED ORDER — PREDNISONE 20 MG PO TABS: 30.0000 mg | ORAL_TABLET | Freq: Every day | ORAL | Status: DC

## 2023-01-16 NOTE — Plan of Care (Signed)
  Problem: Health Behavior/Discharge Planning: Goal: Ability to manage health-related needs will improve Outcome: Progressing   Problem: Clinical Measurements: Goal: Ability to maintain clinical measurements within normal limits will improve Outcome: Progressing Goal: Will remain free from infection Outcome: Progressing Goal: Diagnostic test results will improve Outcome: Progressing Goal: Respiratory complications will improve Outcome: Progressing Goal: Cardiovascular complication will be avoided Outcome: Progressing   Problem: Activity: Goal: Risk for activity intolerance will decrease Outcome: Progressing   Problem: Nutrition: Goal: Adequate nutrition will be maintained Outcome: Progressing   Problem: Coping: Goal: Level of anxiety will decrease Outcome: Progressing   Problem: Elimination: Goal: Will not experience complications related to bowel motility Outcome: Progressing Goal: Will not experience complications related to urinary retention Outcome: Progressing   Problem: Pain Management: Goal: General experience of comfort will improve Outcome: Progressing   Problem: Safety: Goal: Ability to remain free from injury will improve Outcome: Progressing   Problem: Skin Integrity: Goal: Risk for impaired skin integrity will decrease Outcome: Progressing   Problem: Fluid Volume: Goal: Hemodynamic stability will improve Outcome: Progressing   Problem: Clinical Measurements: Goal: Diagnostic test results will improve Outcome: Progressing Goal: Signs and symptoms of infection will decrease Outcome: Progressing   Problem: Respiratory: Goal: Ability to maintain adequate ventilation will improve Outcome: Progressing   Problem: Activity: Goal: Ability to tolerate increased activity will improve Outcome: Progressing   Problem: Clinical Measurements: Goal: Ability to maintain a body temperature in the normal range will improve Outcome: Progressing   Problem:  Respiratory: Goal: Ability to maintain adequate ventilation will improve Outcome: Progressing Goal: Ability to maintain a clear airway will improve Outcome: Progressing

## 2023-01-16 NOTE — Progress Notes (Signed)
Pharmacy Antibiotic Note  Jeffrey Lin is a 47 y.o. male w/ PMH of  PMH significant for anxiety, chronic pain, schizoaffective disorder, hypertension admitted on 01/13/2023 with a drug overdose.  Pharmacy has been consulted for Zosyn dosing ISO suspected aspiration PNA. Serum creatinine notably elevated above apparent baseline level   Plan:  Continue pip/tazo 3.375 g q8H.   Scr improved. Will adjust vancomycin dose as below:  Vancomycin 1750 mg IV Q 12 hrs. Goal AUC 400-550. Expected AUC: 427.9 Expected Cmin: 11.3 SCr used: 0.97  Plan to obtain vancomycin level after 4th or 5th dose.   Height: 6\' 5"  (195.6 cm) Weight: 115 kg (253 lb 8.5 oz) IBW/kg (Calculated) : 89.1  Temp (24hrs), Avg:98.1 F (36.7 C), Min:97.6 F (36.4 C), Max:98.4 F (36.9 C)  Recent Labs  Lab 01/13/23 1236 01/13/23 1438 01/13/23 1654 01/14/23 0126 01/14/23 0726 01/14/23 0927 01/15/23 0350 01/15/23 0352 01/16/23 0405  WBC 17.6*  --   --  18.1*  --   --  21.4*  --  15.5*  CREATININE 2.61*  --   --  1.80*  --   --   --  1.16 0.97  LATICACIDVEN  --  2.7* 2.2*  --  1.6 2.0*  --   --   --   VANCORANDOM  --   --   --   --   --   --  12  --   --     Estimated Creatinine Clearance: 133.9 mL/min (by C-G formula based on SCr of 0.97 mg/dL).    Allergies  Allergen Reactions   Toradol [Ketorolac Tromethamine] Swelling   Lamotrigine Rash    Antimicrobials this admission: 12/23 Unasyn >> 12/24 12/24 azithromycin >> 12/26 12/24 Zosyn >> 12/24 vancomycin >>  Microbiology results: 12/23 BCx: NGTD 12/24 MRSA PCR (+)  Thank you for allowing pharmacy to be a part of this patient's care.  Jadine Brumley A Jamillah Camilo 01/16/2023 1:19 PM

## 2023-01-16 NOTE — Consult Note (Signed)
[facility] Psychiatric Consult Initial  Patient Name: .Jeffrey Lin  MRN: [id number]  DOB: 1976/01/16  Consult Order details:  Orders (From admission, onward)     Start     Ordered   01/16/23 0913  IP CONSULT TO PSYCHIATRY       Ordering Provider: Janann Colonel, MD  Provider:  (Not yet assigned)  Question Answer Comment  Location [facility]   Reason for Consult? Schizoaffective disorder, active hallucinations, frustration      01/16/23 0913             Mode of Visit: In person    Psychiatry Consult Evaluation  Service Date: January 16, 2023 LOS:  LOS: 3 days  Chief Complaint " I came here for pneumonia"   Primary Psychiatric Diagnoses  Schizoaffective Disorder Depressive type 2.  Psychotic Mood Disorder 3.  Generalized Anxiety  Assessment  Jeffrey Lin is a 47 y.o. male admitted: Medicallyfor 01/13/2023 12:24 PM for  . He carries the psychiatric diagnoses of Schizoaffective Disorder Depressive type and has a past medical history of  Opioid Overdose and Acute Hypoxic Respiratory Failure.   His current presentation of agitation is most consistent with ICU Delirium. He meets criteria for continued monitoring based on symptoms.  Current outpatient psychotropic medications include Xanax, Risperdal, Gabapentin and Adderall and historically he has had a positive  response to these medications. He was  compliant with medications prior to admission as evidenced by response. On initial examination, patient was siting in hospital bed with family members by bedside , patient was Alert OX 4, and recognized "the monitors, beeps and constant movements make me anxious. Please see plan below for detailed recommendations.   Diagnoses:  Active Hospital problems: Principal Problem:   Opioid overdose (HCC) Active Problems:   Acute hypoxic respiratory failure (HCC)    Plan   ## Psychiatric Medication Recommendations:  Increase Risperdal to 3mg  daily to  better manage symptoms of psychosis and mood disturbances. Add Trazodone 50mg  nightly to improve sleep hygiene and decrease depressive symptoms.  ## Medical Decision Making Capacity: Not specifically addressed in this encounter  ## Further Work-up:  -- no further work up needed While pt on Qtc prolonging medications, please monitor & replete K+ to 4 and Mg2+ to 2 -- Pertinent labwork reviewed earlier this admission includes: as recommended by attending Intensivist.   ## Disposition:-- There are no psychiatric contraindications to discharge at this time  ## Behavioral / Environmental: - No specific recommendations at this time.     ## Safety and Observation Level:  - Based on my clinical evaluation, I estimate the patient to be at minimal risk of self harm in the current setting. - At this time, we recommend  routine ICU care. This decision is based on my review of the chart including patient's history and current presentation, interview of the patient, mental status examination, and consideration of suicide risk including evaluating suicidal ideation, plan, intent, suicidal or self-harm behaviors, risk factors, and protective factors. This judgment is based on our ability to directly address suicide risk, implement suicide prevention strategies, and develop a safety plan while the patient is in the clinical setting. Please contact our team if there is a concern that risk level has changed.  CSSR Risk Category:C-SSRS RISK CATEGORY: No Risk  Suicide Risk Assessment: Patient has following modifiable risk factors for suicide: under treated depression , which we are addressing by medication and safety plan. Patient has following non-modifiable or demographic risk factors  for suicide: male gender and psychiatric hospitalization Patient has the following protective factors against suicide: Access to outpatient mental health care, Supportive family, Supportive friends, and no history of suicide  attempts  Thank you for this consult request. Recommendations have been communicated to the primary team.  We will refer patient back to Primary care team  at this time.  Thank you so much for allowing me to participate in the care of this patient. If further assistance is needed please contact again.  Myriam Forehand, NP       History of Present Illness  Relevant Aspects of [facility] Course:  Admitted on 01/13/2023 for Principal Problem:   Opioid overdose , Acute hypoxic respiratory failure   Patient Report:  Patient reports feeling uneasy and overwhelmed "by the beeps and montiors"  Psych ROS:  Depression: moderate depression Anxiety:  9/10 Mania (lifetime and current): none reported Psychosis: (lifetime and current): 3 times prior to this admission    Review of Systems  Respiratory:  Positive for shortness of breath.   Psychiatric/Behavioral:  Positive for depression. The patient has insomnia.   All other systems reviewed and are negative.    Psychiatric and Social History  Psychiatric History:  Information collected from patient  Access to weapons/lethal means: none reported   Substance History Alcohol: denies  Type of alcohol n/a Last Drink n/a Number of drinks per day n/a History of alcohol withdrawal seizures n/a History of DT's denies Tobacco: 1 1/2 pack daily Illicit drugs: denies Prescription drug abuse: denies Rehab hx: denies  Exam Findings  Physical Exam: see below Vital Signs:  Temp:  [97.6 F (36.4 C)-98.4 F (36.9 C)] 97.6 F (36.4 C) (12/26 1200) Pulse Rate:  [82-122] 84 (12/26 1200) Resp:  [12-29] 21 (12/26 1200) BP: (129-159)/(88-111) 138/91 (12/26 1200) SpO2:  [83 %-100 %] 92 % (12/26 1327) FiO2 (%):  [40 %-60 %] 60 % (12/26 0818) Weight:  [161 kg] 115 kg (12/26 0203) Blood pressure (!) 138/91, pulse 84, temperature 97.6 F (36.4 C), temperature source Axillary, resp. rate (!) 21, height 6\' 5"  (1.956 m), weight 115 kg, SpO2 92%.  Body mass index is 30.06 kg/m.  Physical Exam Vitals and nursing note reviewed.  Constitutional:      Appearance: Normal appearance.  HENT:     Head: Normocephalic and atraumatic.     Nose: Nose normal.  Pulmonary:     Effort: Pulmonary effort is normal.  Musculoskeletal:        General: Normal range of motion.     Cervical back: Normal range of motion.  Neurological:     Mental Status: He is alert.  Psychiatric:        Attention and Perception: Attention and perception normal.        Mood and Affect: Mood is anxious and depressed. Affect is flat.        Speech: Speech normal.        Behavior: Behavior normal. Behavior is cooperative.        Thought Content: Thought content normal.        Cognition and Memory: Cognition and memory normal.        Judgment: Judgment is impulsive.     Mental Status Exam: General Appearance: Fairly Groomed  Orientation:  Full (Time, Place, and Person)  Memory:  Immediate;   Good Recent;   Good  Concentration:  Concentration: Fair  Recall:  Good  Attention  Good  Eye Contact:  Minimal  Speech:  Normal Rate  and Slow  Language:  Good  Volume:  Normal  Mood: Anxious  Affect:  Blunt and Flat  Thought Process:  Coherent  Thought Content:  WDL  Suicidal Thoughts:  No  Homicidal Thoughts:  No  Judgement:  Intact  Insight:  Fair  Psychomotor Activity:  Normal  Akathisia:  Negative  Fund of Knowledge:  Good      Assets:  Desire for Improvement Financial Resources/Insurance Housing Social Support  Cognition:  WNL  ADL's:  Intact  AIMS (if indicated):        Other History   These have been pulled in through the EMR, reviewed, and updated if appropriate.  Family History:  The patient's family history includes Anxiety disorder in his brother and mother; Bipolar disorder in his maternal grandfather and paternal grandfather; Depression in his brother and mother; Hyperlipidemia in his mother; Hypertension in his mother; Lung disease in his  father; Schizophrenia in his maternal grandfather.  Medical History: Past Medical History:  Diagnosis Date   Anxiety    Chronic heart failure with preserved ejection fraction (HFpEF) (HCC)    a. 02/2016 Echo: EF 55-60%; b. 04/2019 Echo: EF 60-65%, no rwma, mod reduced RV fxn, mod dil RA; c. 07/2019 RHC: RA 2, RV 39/1, PA 39/15, PCWP 7, CO 9.1, CI 3.4.   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   RIGHT HEART CATH AND CORONARY ANGIOGRAPHY Right 08/02/2019   Procedure: RIGHT HEART CATH AND CORONARY ANGIOGRAPHY;  Surgeon: Iran Ouch, MD;  Location: [facility] INVASIVE CV LAB;  Service: Cardiovascular;  Laterality: Right;   Sphenopalatine ganglionic       Medications:   Current Facility-Administered Medications:    ALPRAZolam (XANAX) tablet 0.5 mg, 0.5 mg, Oral, BID PRN, Harlon Ditty D, NP, 0.5 mg at 01/16/23 1258   ALPRAZolam (XANAX) tablet 1 mg, 1 mg, Oral, BID, Assaker, Jean-Pierre, MD, 1 mg at 01/16/23 0910   amLODipine (NORVASC) tablet 5 mg, 5 mg, Oral, Daily, Assaker, Jean-Pierre, MD, 5 mg at 01/16/23 1145   amphetamine-dextroamphetamine (ADDERALL) tablet 10 mg, 10 mg, Oral, TID AC, Assaker, Jean-Pierre, MD, 10 mg at 01/15/23 1736   aspirin EC tablet 81 mg, 81 mg, Oral, Daily, Harlon Ditty D, NP, 81 mg at 01/16/23 0911   azithromycin (ZITHROMAX) 500 mg in sodium chloride 0.9 % 250 mL IVPB, 500 mg, Intravenous, Q24H, Assaker, Jean-Pierre, MD, Last Rate: 250 mL/hr at 01/15/23 1450, 500 mg at 01/15/23 1450   Chlorhexidine Gluconate Cloth 2 % PADS 6 each, 6 each, Topical, Daily, Assaker, Jean-Pierre, MD, 6 each at 01/16/23  0911   cloNIDine (CATAPRES) tablet 0.2 mg, 0.2 mg, Oral, BID, Assaker, [place], MD   docusate sodium (COLACE) capsule 100 mg, 100 mg, Oral, BID PRN, Assaker, Jean-Pierre, MD   gabapentin (NEURONTIN) capsule 600 mg, 600 mg, Oral, TID, Assaker, Jean-Pierre, MD   heparin injection 5,000 Units, 5,000 Units, Subcutaneous, Q8H, Ronnald Ramp, RPH, 5,000 Units at 01/16/23 1323   ipratropium-albuterol (DUONEB) 0.5-2.5 (3) MG/3ML nebulizer solution 3 mL, 3 mL,  Nebulization, Q6H, Assaker, [place], MD, 3 mL at 01/16/23 1320   metoprolol tartrate (LOPRESSOR) tablet 12.5 mg, 12.5 mg, Oral, BID, Harlon Ditty D, NP, 12.5 mg at 01/16/23 0910   nicardipine (CARDENE) 20mg  in 0.86% saline IV infusion (0.1 mg/ml), 3-15 mg/hr, Intravenous, Continuous, Harlon Ditty D, NP, Last Rate: 30 mL/hr at 01/16/23 1445, 3 mg/hr at 01/16/23 1445   piperacillin-tazobactam (ZOSYN) IVPB 3.375 g, 3.375 g, Intravenous, Q8H, Assaker, Jean-Pierre, MD, Last Rate: 12.5 mL/hr at 01/16/23 1147, 3.375 g at 01/16/23 1147   polyethylene glycol (MIRALAX / GLYCOLAX) packet 17 g, 17 g, Oral, Daily PRN, Assaker, [place], MD   Melene Muller ON 01/17/2023] predniSONE (DELTASONE) tablet 30 mg, 30 mg, Oral, Q breakfast **FOLLOWED BY** [START ON 01/20/2023] predniSONE (DELTASONE) tablet 20 mg, 20 mg, Oral, Q breakfast **FOLLOWED BY** [START ON 01/23/2023] predniSONE (DELTASONE) tablet 10 mg, 10 mg, Oral, Q breakfast, Assaker, [place], MD   risperiDONE (RISPERDAL) tablet 2 mg, 2 mg, Oral, QHS, Assaker, Jean-Pierre, MD   risperiDONE (RISPERDAL) tablet 2 mg, 2 mg, Oral, Once, Assaker, [place], MD   sodium chloride HYPERTONIC 3 % nebulizer solution 4 mL, 4 mL, Nebulization, TID, Assaker, Jean-Pierre, MD, 4 mL at 01/16/23 1320   vancomycin (VANCOCIN) IVPB 1000 mg/200 mL premix, 1,000 mg, Intravenous, Q12H **AND** vancomycin (VANCOREADY) IVPB 750 mg/150 mL, 750 mg, Intravenous, Q12H, Nazari, Walid A, RPH  Allergies: Allergies  Allergen  Reactions   Toradol [Ketorolac Tromethamine] Swelling   Lamotrigine Rash    Myriam Forehand, NP

## 2023-01-16 NOTE — Consult Note (Signed)
PHARMACY CONSULT NOTE - ELECTROLYTES  Pharmacy Consult for Electrolyte Monitoring and Replacement   Recent Labs: Potassium (mmol/L)  Date Value  01/16/2023 4.0  09/11/2013 3.6   Magnesium (mg/dL)  Date Value  11/17/2534 2.0  09/11/2013 2.1   Calcium (mg/dL)  Date Value  64/40/3474 8.8 (L)   Calcium, Total (mg/dL)  Date Value  25/95/6387 8.0 (L)   Albumin (g/dL)  Date Value  56/43/3295 3.7  01/16/2023 3.8  09/09/2013 4.6   Albumin Serum (g/dL)  Date Value  18/84/1660 4.5   Phosphorus (mg/dL)  Date Value  63/01/6008 2.6   Sodium (mmol/L)  Date Value  01/16/2023 131 (L)  07/30/2019 140  09/11/2013 145   Height: 6\' 5"  (195.6 cm) Weight: 115 kg (253 lb 8.5 oz) IBW/kg (Calculated) : 89.1 Estimated Creatinine Clearance: 133.9 mL/min (by C-G formula based on SCr of 0.97 mg/dL).  Assessment  Jeffrey Lin is a 47 y.o. male presenting with drug overdose. PMH significant for anxiety, chronic pain, schizoaffective disorder, hypertension . Pharmacy has been consulted to monitor and replace electrolytes.  MIVF: N/A Pertinent medications: N/A  Goal of Therapy: Electrolytes within normal limits  Plan:  No replacement currently indicated F/u with AM labs.    Thank you for allowing pharmacy to be a part of this patient's care.  Kail Fraley A Aldred Mase 01/16/2023 7:20 AM

## 2023-01-16 NOTE — TOC CM/SW Note (Signed)
Transition of Care [facility]) - Inpatient Brief Assessment   Patient Details  Name: Jeffrey Lin MRN: [id number] Date of Birth: 1975/06/26  Transition of [facility] CM/SW Contact:    Margarito Liner, LCSW Phone Number: 01/16/2023, 12:18 PM   Clinical Narrative: CSW reviewed chart. Patient is currently on acute oxygen. Will follow for this potential discharge need. No other TOC needs identified so far. Please place [facility] consult if any needs arise.  Transition of Care Asessment: Insurance and Status: Insurance coverage has been reviewed Patient has primary care physician: Yes Home environment has been reviewed: Single family home Prior level of function:: Not documented Prior/Current Home Services: No current home services Social Drivers of Health Review: SDOH reviewed no interventions necessary Readmission risk has been reviewed: Yes Transition of care needs: transition of care needs identified, TOC will continue to follow

## 2023-01-16 NOTE — Progress Notes (Addendum)
NAME:  Jeffrey Lin, MRN:  [id number], DOB:  1975-09-06, LOS: 3 ADMISSION DATE:  01/13/2023 History of Present Illness:  Jeffrey Lin is a 47 year old male patient with a past medical history of anxiety, depression, chronic pain, schizoaffective disorder and hypertension who presented to [facility] on 01/13/2023 with acute hypoxic respiratory failure. He was found down at home with sats in the 70s brought in initially thought to be secondary to opioid overdose and was given Narcan. He improved on BiPAP and was started on Unasyn for aspiration pneumonia. He was also noted to have adequate stated troponin peaked at 1700 without EKG changes and without any s complaints of chest pain. Started on heparin drip aspirin and metoprolol 12.5 twice daily. He continued to be hypoxic presenting today for a CT chest without contrast which showed a large right lower lobe pneumonia. MRSA swab positive. Antibiotics broadened to vancomycin and Zosyn and azithromycin. Utox negative for opioids and only positive for bzd and amphetamines.   Pertinent  Medical History  As above  Significant Hospital Events: Including procedures, antibiotic start and stop dates in addition to other pertinent events   -- 12/23 admitted to the ICU  -- 12/24 CT chest with large RLL consolidation. MRSA positive. Abx broadened.,  -- 12/26 - O2 continues to improve. Today on HFNC at 50% sats in the high 90s.   Interim History / Subjective:  No major events overnight. Improving from a respiratory standpoint. Remains on HFNC at 50% FiO2.   Objective   Blood pressure 136/88, pulse (!) 105, temperature 98.4 F (36.9 C), resp. rate (!) 22, height 6\' 5"  (1.956 m), weight 115 kg, SpO2 96%.    FiO2 (%):  [40 %-60 %] 60 %   Intake/Output Summary (Last 24 hours) at 01/16/2023 0917 Last data filed at 01/16/2023 0442 Gross per 24 hour  Intake 2064.07 ml  Output 800 ml  Net 1264.07 ml   Filed Weights   01/14/23 0900 01/15/23 0233 01/16/23 0203   Weight: 116.5 kg 115.7 kg 115 kg    Examination: General: Looks comfortable in no acute distress  HENT: Supple neck reactive pupils  Lungs: Faint wheezing heard bilaterally imptoved.  Cardiovascular: Normal S1, Normal S2 RRR Abdomen: Soft nom tender, non distended, +BS  Extremities: Warm and well perfused   Labs and imaging were reviewed.   AST ALT improving. WBC downtrending. Kidney function back to baseline. Procalc 0.51. WBC continues to trend up now at 21.   Echocardiogram 12/24 - LVEF borderline low 50%. Unable to evaluate RWMA. No significant valvular disease.   Assessment & Plan:  Case of a 47 year old male patient with a past medical history of schizoaffective disorder, tobacco use disorder presenting to [facility] on 12/23 with acute hypoxic respiratory failure secondary to community-acquired pneumonia. MRSA positive. Course complicated by AKI in the setting of sepsis baseline 1.0 mg/dl currently improving at 1.8mg /dl (and demand ischemia. Course also complicated by hepatitis possibly shock liver.   #Acute hypoxic respiratory failure in the setting of...  #CAP (MRSA +) CT chest with large RLL consolidation. Urine strep -,  #Likely also undiagnosed COPD with exacerbation.  #AKI (Improved)  #Hepatitis likely secondary to prolonged hypoxia Improving.  #Worsening leukocytosis likely in the setting of steroids initiation.  #Schizoaffective disorder with active hallucinations.   Neuro: c/w home Xanax 1 bid, Adderal 10mg  TID, and Gabapentin 300mg  TID. Start Risperidone 2mg  at bedtime. Psych consult placed.  [facility]: Metoprolol 12.5mg  BID. Aspirin 81mg  daily. Increase Clonidine to 0.2 BID. Amlodipine 5mg   daily.  Lungs: Vanc, zosyn, Azithrom for a total of 5 days.Marland Kitchen Sputum culture if able. Prednisone 30mg  PO daily and taper by 10mg  every 3 days.  Duonebs Q4H schedule. X1 Albuterol 5mg  for 1hr. Wean HFNC for a Goal SpO2 88-92%.   GI: Diet placed. Px not indicated.   Renal: Trend Cr daily.   Best Practice (right click and "Reselect all SmartList Selections" daily)   Diet/type: Regular consistency (see orders) DVT prophylaxis systemic heparin Pressure ulcer(s): N/A GI prophylaxis: N/A Lines: N/A Foley:  DC foley.  Code Status:  full code Last date of multidisciplinary goals of care discussion [01/15/2023]  I spent 55 minutes caring for this patient today, including preparing to see the patient, obtaining a medical history , reviewing a separately obtained history, performing a medically appropriate examination and/or evaluation, ordering medications, tests, or procedures, documenting clinical information in the electronic health record, and independently interpreting results (not separately reported/billed) and communicating results to the patient/family/caregiver  Jeffrey Colonel, MD [place] 01/16/2023 9:17 AM

## 2023-01-17 ENCOUNTER — Telehealth (HOSPITAL_BASED_OUTPATIENT_CLINIC_OR_DEPARTMENT_OTHER): Payer: Self-pay | Admitting: Pulmonary Disease

## 2023-01-17 ENCOUNTER — Telehealth: Payer: Self-pay

## 2023-01-17 ENCOUNTER — Inpatient Hospital Stay: Payer: Medicare Other

## 2023-01-17 DIAGNOSIS — J15212 Pneumonia due to Methicillin resistant Staphylococcus aureus: Secondary | ICD-10-CM | POA: Diagnosis not present

## 2023-01-17 LAB — CBC
HCT: 47.3 % (ref 39.0–52.0)
Hemoglobin: 16.3 g/dL (ref 13.0–17.0)
MCH: 31.2 pg (ref 26.0–34.0)
MCHC: 34.5 g/dL (ref 30.0–36.0)
MCV: 90.6 fL (ref 80.0–100.0)
Platelets: 196 10*3/uL (ref 150–400)
RBC: 5.22 MIL/uL (ref 4.22–5.81)
RDW: 13.1 % (ref 11.5–15.5)
WBC: 12.9 10*3/uL — ABNORMAL HIGH (ref 4.0–10.5)
nRBC: 0 % (ref 0.0–0.2)

## 2023-01-17 LAB — MAGNESIUM: Magnesium: 2 mg/dL (ref 1.7–2.4)

## 2023-01-17 LAB — RENAL FUNCTION PANEL
Albumin: 3.7 g/dL (ref 3.5–5.0)
Anion gap: 13 (ref 5–15)
BUN: 23 mg/dL — ABNORMAL HIGH (ref 6–20)
CO2: 27 mmol/L (ref 22–32)
Calcium: 9.2 mg/dL (ref 8.9–10.3)
Chloride: 96 mmol/L — ABNORMAL LOW (ref 98–111)
Creatinine, Ser: 0.97 mg/dL (ref 0.61–1.24)
GFR, Estimated: >60 mL/min (ref >60)
Glucose, Bld: 111 mg/dL — ABNORMAL HIGH (ref 70–99)
Phosphorus: 2.8 mg/dL (ref 2.5–4.6)
Potassium: 3.9 mmol/L (ref 3.5–5.1)
Sodium: 136 mmol/L (ref 135–145)

## 2023-01-17 LAB — HEPATIC FUNCTION PANEL
ALT: 574 U/L — ABNORMAL HIGH (ref 0–44)
AST: 81 U/L — ABNORMAL HIGH (ref 15–41)
Albumin: 3.7 g/dL (ref 3.5–5.0)
Alkaline Phosphatase: 78 U/L (ref 38–126)
Bilirubin, Direct: 0.1 mg/dL (ref 0.0–0.2)
Indirect Bilirubin: 1 mg/dL — ABNORMAL HIGH (ref 0.3–0.9)
Total Bilirubin: 1.1 mg/dL (ref <1.2)
Total Protein: 7 g/dL (ref 6.5–8.1)

## 2023-01-17 LAB — LEGIONELLA PNEUMOPHILA SEROGP 1 UR AG: L. pneumophila Serogp 1 Ur Ag: NEGATIVE

## 2023-01-17 MED ORDER — ASPIRIN 81 MG PO TBEC: 81.0000 mg | DELAYED_RELEASE_TABLET | Freq: Every day | ORAL | 12 refills | Status: AC

## 2023-01-17 MED ORDER — DOXYCYCLINE HYCLATE 100 MG PO TABS: 100.0000 mg | ORAL_TABLET | Freq: Two times a day (BID) | ORAL | 0 refills | Status: AC

## 2023-01-17 MED ORDER — FLUTICASONE FUROATE-VILANTEROL 100-25 MCG/ACT IN AEPB
1.0000 | INHALATION_SPRAY | Freq: Every day | RESPIRATORY_TRACT | Status: DC
  Filled 2023-01-17: qty 28

## 2023-01-17 MED ORDER — METOPROLOL TARTRATE 25 MG PO TABS: 12.5000 mg | ORAL_TABLET | Freq: Two times a day (BID) | ORAL | 0 refills | Status: DC

## 2023-01-17 MED ORDER — DOXYCYCLINE HYCLATE 100 MG PO TABS
100.0000 mg | ORAL_TABLET | Freq: Two times a day (BID) | ORAL | Status: DC
Start: 2023-01-17 — End: 2023-01-17

## 2023-01-17 MED ORDER — AMPHETAMINE-DEXTROAMPHETAMINE 10 MG PO TABS: 20.0000 mg | ORAL_TABLET | Freq: Three times a day (TID) | ORAL | Status: DC

## 2023-01-17 MED ORDER — AMLODIPINE BESYLATE 10 MG PO TABS: 10.0000 mg | ORAL_TABLET | Freq: Every day | ORAL | Status: DC

## 2023-01-17 MED ORDER — CLONIDINE HCL 0.2 MG PO TABS: 0.2000 mg | ORAL_TABLET | Freq: Two times a day (BID) | ORAL | 11 refills | Status: DC

## 2023-01-17 MED ORDER — BREO ELLIPTA 100-25 MCG/ACT IN AEPB: 1.0000 | INHALATION_SPRAY | Freq: Every day | RESPIRATORY_TRACT | 0 refills | Status: DC

## 2023-01-17 MED ORDER — AMLODIPINE BESYLATE 10 MG PO TABS: 10.0000 mg | ORAL_TABLET | Freq: Every day | ORAL | 0 refills | Status: DC

## 2023-01-17 MED ORDER — TRAZODONE HCL 50 MG PO TABS: 50.0000 mg | ORAL_TABLET | Freq: Every day | ORAL | 0 refills | Status: DC

## 2023-01-17 MED ORDER — FUROSEMIDE 10 MG/ML IJ SOLN
40.0000 mg | Freq: Once | INTRAMUSCULAR | Status: DC
Start: 2023-01-17 — End: 2023-01-17

## 2023-01-17 MED ORDER — RISPERIDONE 3 MG PO TABS: 3.0000 mg | ORAL_TABLET | Freq: Every day | ORAL | 0 refills | Status: DC

## 2023-01-17 NOTE — Progress Notes (Signed)
Followed up with respiratory was told to leave patient on 15 Liters.  Willey Blade 01/17/2023

## 2023-01-17 NOTE — Telephone Encounter (Signed)
[facility] After Hours Pager  Contacted by [employer] in [place] [phone]) to request for oxygen prescription.  Chart reviewed and patient was hospitalized until he left AMA today on 01/17/23. He had home O2 needs however notes document he did not want to wait for O2 delivery. Unable to write script without qualifying saturations.  He is scheduled for follow-up with Dr. Larinda Buttery on 01/20/23 for hospital follow-up. Patient was called and informed that we are unable to prescribe oxygen without qualifying saturations. Advised him to return to the ED if he wished to obtain oxygen. He declined going to the ED. Discussed risks of low oxygen including cardiac/respiratory arrest and death. Patient expressed understanding and stated he was fine and would wait until Monday.  Dr. Larinda Buttery updated by phone  Mechele Collin, M.D. [facility] 01/17/2023 2:02 PM   See Amion for personal pager For hours between 7 PM to 7 AM, please call Elink for urgent questions

## 2023-01-17 NOTE — Consult Note (Signed)
PHARMACY CONSULT NOTE - ELECTROLYTES  Pharmacy Consult for Electrolyte Monitoring and Replacement   Recent Labs: Potassium (mmol/L)  Date Value  01/17/2023 3.9  09/11/2013 3.6   Magnesium (mg/dL)  Date Value  96/04/5407 2.0  09/11/2013 2.1   Calcium (mg/dL)  Date Value  81/19/1478 9.2   Calcium, Total (mg/dL)  Date Value  29/56/2130 8.0 (L)   Albumin (g/dL)  Date Value  86/57/8469 3.7  09/09/2013 4.6   Albumin Serum (g/dL)  Date Value  62/95/2841 4.5   Phosphorus (mg/dL)  Date Value  32/44/0102 2.8   Sodium (mmol/L)  Date Value  01/17/2023 136  07/30/2019 140  09/11/2013 145   Height: 6\' 5"  (195.6 cm) Weight: 115 kg (253 lb 8.5 oz) IBW/kg (Calculated) : 89.1 Estimated Creatinine Clearance: 133.9 mL/min (by C-G formula based on SCr of 0.97 mg/dL).  Assessment  Jeffrey Lin is a 47 y.o. male presenting with drug overdose. PMH significant for anxiety, chronic pain, schizoaffective disorder, hypertension . Pharmacy has been consulted to monitor and replace electrolytes.  MIVF: N/A Pertinent medications: N/A  Goal of Therapy: Electrolytes within normal limits  Plan:  No replacement currently indicated F/u with AM labs.    Thank you for allowing pharmacy to be a part of this patient's care.  Aran Menning A Zarielle Cea 01/17/2023 7:02 AM

## 2023-01-17 NOTE — Telephone Encounter (Signed)
Received below message from Dr. Larinda Buttery via epic secure chat-  can we please get this patient to the clinic on Monday. Left AMA from the ICU    Spoke to patient and scheduled appt for 01/20/2023 at 10:00. Directions provided.  Nothing further needed.

## 2023-01-17 NOTE — Plan of Care (Signed)
  Problem: Nutrition: Goal: Adequate nutrition will be maintained Outcome: Progressing   Problem: Coping: Goal: Level of anxiety will decrease Outcome: Progressing   Problem: Elimination: Goal: Will not experience complications related to urinary retention Outcome: Progressing   Problem: Safety: Goal: Ability to remain free from injury will improve Outcome: Progressing   Problem: Skin Integrity: Goal: Risk for impaired skin integrity will decrease Outcome: Progressing   Problem: Fluid Volume: Goal: Hemodynamic stability will improve Outcome: Progressing   Problem: Activity: Goal: Ability to tolerate increased activity will improve Outcome: Progressing   Problem: Clinical Measurements: Goal: Ability to maintain a body temperature in the normal range will improve Outcome: Progressing   Problem: Respiratory: Goal: Ability to maintain adequate ventilation will improve Outcome: Progressing Goal: Ability to maintain a clear airway will improve Outcome: Progressing

## 2023-01-17 NOTE — Progress Notes (Addendum)
Patient non compliant, getting up without calling for assistance. Educated on the importance of calling before getting out of bed and the risks.  Albertine Patricia, RN 01/17/2023

## 2023-01-17 NOTE — Progress Notes (Addendum)
NAME:  Jeffrey Lin, MRN:  [id number], DOB:  05/07/1975, LOS: 4 ADMISSION DATE:  01/13/2023 History of Present Illness:  Jeffrey Lin is a 47 year old male patient with a past medical history of anxiety, depression, chronic pain, schizoaffective disorder and hypertension who presented to [facility] on 01/13/2023 with acute hypoxic respiratory failure. He was found down at home with sats in the 70s brought in initially thought to be secondary to opioid overdose and was given Narcan. He improved on BiPAP and was started on Unasyn for aspiration pneumonia. He was also noted to have adequate stated troponin peaked at 1700 without EKG changes and without any s complaints of chest pain. Started on heparin drip aspirin and metoprolol 12.5 twice daily. He continued to be hypoxic presenting today for a CT chest without contrast which showed a large right lower lobe pneumonia. MRSA swab positive. Antibiotics broadened to vancomycin and Zosyn and azithromycin. Utox negative for opioids and only positive for bzd and amphetamines.   Pertinent  Medical History  As above  Significant Hospital Events: Including procedures, antibiotic start and stop dates in addition to other pertinent events   -- 12/23 admitted to the ICU  -- 12/24 CT chest with large RLL consolidation. MRSA positive. Abx broadened.,  -- 12/26 - O2 continues to improve. Today on HFNC at 50% sats in the high 90s.  -- 01/17/2023 Trialed weaning down HFNC yesterday however desaturated to the mid 80s. Had to go back to HFNC at 50%.   Interim History / Subjective:  No major events overnight. Improving from a respiratory standpoint. Remains on HFNC at 50% FiO2.   Objective   Blood pressure (!) 157/111, pulse (!) 114, temperature 98.1 F (36.7 C), temperature source Oral, resp. rate 14, height 6\' 5"  (1.956 m), weight 115 kg, SpO2 93%.    FiO2 (%):  [45 %] 45 %   Intake/Output Summary (Last 24 hours) at 01/17/2023 0929 Last data filed at 01/17/2023  4098 Gross per 24 hour  Intake 439.05 ml  Output 175 ml  Net 264.05 ml   Filed Weights   01/15/23 0233 01/16/23 0203 01/17/23 0319  Weight: 115.7 kg 115 kg 115 kg    Examination: General: Looks comfortable in no acute distress  HENT: Supple neck reactive pupils  Lungs: Faint wheezing heard bilaterally imptoved.  Cardiovascular: Normal S1, Normal S2 RRR Abdomen: Soft nom tender, non distended, +BS  Extremities: Warm and well perfused   Labs and imaging were reviewed.   WBC continues to downtrend now at 12. Kidney function ok. Procalc 0.51 --> 0.26 (12/27).   Echocardiogram 12/24 - LVEF borderline low 50%. Unable to evaluate RWMA. No significant valvular disease.   Assessment & Plan:  Case of a 47 year old male patient with a past medical history of schizoaffective disorder, tobacco use disorder presenting to [facility] on 12/23 with acute hypoxic respiratory failure secondary to community-acquired pneumonia. MRSA positive. Course complicated by AKI in the setting of sepsis baseline 1.0 mg/dl currently improving at 1.8mg /dl (and demand ischemia. Course also complicated by hepatitis possibly shock liver.   #Acute hypoxic respiratory failure in the setting of...  #CAP (MRSA +) CT chest with large RLL consolidation. Urine strep -,  #Likely also undiagnosed COPD with exacerbation.  #AKI (Improved)  #Hepatitis likely secondary to prolonged hypoxia Improving.  #Worsening leukocytosis likely in the setting of steroids initiation (Improving).  #Schizoaffective disorder with active hallucinations.   Neuro: c/w home Xanax 1 bid, Adderal 10mg  TID, and Gabapentin 600mg  TID.  Risperidone 3mg   at bedtime. Trazodone 50mg  at bedtime. Appreciate psych recs.  CVS: Metoprolol 12.5mg  BID. Aspirin 81mg  daily. Increase Clonidine to 0.2 BID.Increase Amlodipine 10mg  daily. Wean down Nicardipine drip for SBP < 140.  Lungs: Vanc, zosyn, Azithrom for a total of 5 days.Marland Kitchen Sputum culture if able. Prednisone 30mg  PO  daily and taper by 10mg  every 3 days.  Duonebs Q4H schedule. Wean HFNC for a Goal SpO2 88-92%.  OOB to chair today and encourage IS every hour and Flutter valve every hr.  GI: Diet placed. Px not indicated.   Renal: Trend Cr daily.  Heme: Lovenox 40mg  [place] for dvt prophylaxis  Patient decided to leave AMA. Risks were relayed including cardiac arrest and death. Patient expressed wishes to go. Capacity is present and his mother was with him at bedside. He signed the [facility] paperwork and close follow up was arranged in the clinic. Abx prescription and inhaler prescription were sent.  Best Practice (right click and "Reselect all SmartList Selections" daily)   Diet/type: Regular consistency (see orders) DVT prophylaxis systemic heparin Pressure ulcer(s): N/A GI prophylaxis: N/A Lines: N/A Foley:  DC foley.  Code Status:  full code Last date of multidisciplinary goals of care discussion [01/15/2023]  I spent 48 minutes caring for this patient today, including preparing to see the patient, obtaining a medical history , reviewing a separately obtained history, performing a medically appropriate examination and/or evaluation, ordering medications, tests, or procedures, documenting clinical information in the electronic health record, and independently interpreting results (not separately reported/billed) and communicating results to the patient/family/caregiver  Janann Colonel, MD [place] [facility] 01/17/2023 9:29 AM

## 2023-01-17 NOTE — Progress Notes (Signed)
Pharmacy Antibiotic Note  Jeffrey Lin is a 47 y.o. male w/ PMH of  PMH significant for anxiety, chronic pain, schizoaffective disorder, hypertension admitted on 01/13/2023 with a drug overdose.  Pharmacy has been consulted for Zosyn dosing ISO suspected aspiration PNA.   Day 5 of antibiotics. MRSA PCR was positive. Per CCMD, will plan for 7 days total. Possible switch to PO abx if patient's status continues to improve.  Plan:  Continue pip/tazo 3.375 g q8H.   Continue Vancomycin 1750 mg IV Q 12 hrs. Goal AUC 400-550. Expected AUC: 427.9 Expected Cmin: 11.3 SCr used: 0.97  Plan to obtain vancomycin level after 4th or 5th dose.   Height: 6\' 5"  (195.6 cm) Weight: 115 kg (253 lb 8.5 oz) IBW/kg (Calculated) : 89.1  Temp (24hrs), Avg:97.8 F (36.6 C), Min:97.6 F (36.4 C), Max:98.1 F (36.7 C)  Recent Labs  Lab 01/13/23 1236 01/13/23 1438 01/13/23 1654 01/14/23 0126 01/14/23 0726 01/14/23 0927 01/15/23 0350 01/15/23 0352 01/16/23 0405 01/17/23 0437 01/17/23 0438  WBC 17.6*  --   --  18.1*  --   --  21.4*  --  15.5* 12.9*  --   CREATININE 2.61*  --   --  1.80*  --   --   --  1.16 0.97  --  0.97  LATICACIDVEN  --  2.7* 2.2*  --  1.6 2.0*  --   --   --   --   --   VANCORANDOM  --   --   --   --   --   --  12  --   --   --   --     Estimated Creatinine Clearance: 133.9 mL/min (by C-G formula based on SCr of 0.97 mg/dL).    Allergies  Allergen Reactions   Toradol [Ketorolac Tromethamine] Swelling   Lamotrigine Rash    Antimicrobials this admission: 12/23 Unasyn >> 12/24 12/24 azithromycin >> 12/26 12/24 Zosyn >> 12/24 vancomycin >>  Microbiology results: 12/23 BCx: NGTD 12/24 MRSA PCR (+)  Thank you for allowing pharmacy to be a part of this patient's care.  Bettey Costa 01/17/2023 8:38 AM

## 2023-01-17 NOTE — Progress Notes (Signed)
Patient Sats in mid 33's, Reached out to respiratory,  order given to Increase 02 to 10 Liters, then to 15 liters if no change. Will Cont to monitor.  Willey Blade 01/17/2023

## 2023-01-17 NOTE — Progress Notes (Signed)
Patient was in room removing cardiac monitoring, pulse ox, and putting clothes on.  Patient states that he is leaving and going home.  Dr. Larinda Buttery notified, came to bedside and spoke with patient.  Patient unwilling to stay, patient verbalized understanding of leaving against medical advice and signed AMA form.  PIV removed.  Patient left the unit.

## 2023-01-17 NOTE — TOC CM/SW Note (Signed)
CSW was notified that patient is leaving AMA, needs home o2. Per MD, patient has already walked out, did not want to wait for o2 delivery.  CSW checked with [facility] Supervisor - we cannot provide home o2 without qualifying sats.   Alfonso Ramus, LCSW Transitions of Care Department [phone]

## 2023-01-18 LAB — CULTURE, BLOOD (ROUTINE X 2)
Culture: NO GROWTH
Culture: NO GROWTH

## 2023-01-20 ENCOUNTER — Encounter: Payer: Self-pay | Admitting: Pulmonary Disease

## 2023-01-20 ENCOUNTER — Ambulatory Visit: Payer: Medicare Other | Admitting: Pulmonary Disease

## 2023-01-20 VITALS — BP 120/88 | HR 126 | Temp 97.6°F | Ht 77.0 in | Wt 275.6 lb

## 2023-01-20 DIAGNOSIS — Z87891 Personal history of nicotine dependence: Secondary | ICD-10-CM

## 2023-01-20 DIAGNOSIS — J189 Pneumonia, unspecified organism: Secondary | ICD-10-CM

## 2023-01-20 DIAGNOSIS — R0602 Shortness of breath: Secondary | ICD-10-CM

## 2023-01-20 DIAGNOSIS — R0902 Hypoxemia: Secondary | ICD-10-CM

## 2023-01-20 MED ORDER — TRELEGY ELLIPTA 100-62.5-25 MCG/ACT IN AEPB: 1.0000 | INHALATION_SPRAY | Freq: Every day | RESPIRATORY_TRACT | 0 refills | Status: DC

## 2023-01-20 MED ORDER — TRELEGY ELLIPTA 100-62.5-25 MCG/ACT IN AEPB: 1.0000 | INHALATION_SPRAY | Freq: Once | RESPIRATORY_TRACT | 6 refills | Status: AC

## 2023-01-20 NOTE — Progress Notes (Signed)
Synopsis: Referred in by Excell Seltzer, MD   Subjective:   PATIENT ID: Jeffrey Lin GENDER: male DOB: February 22, 1975, MRN: [id number]  Chief Complaint  Patient presents with   Hospitalization Follow-up    Persistent cough. Shortness of breath on exertion. No wheezing.     HPI Jeffrey Lin is a 47 year old male patient with a past medical history of heavy tobacco use, schizoaffective disorder who is presenting today to the pulmonary clinic for an inpatient follow-up for community-acquired pneumonia.  He presented to [facility] last week with hypoxic respiratory failure requiring high flow nasal cannula.  He was found to have a large right-sided consolidation on CT chest 12/24.  MRSA PCR positive.  Started on vancomycin Zosyn and azithromycin12/24.  He left AMA on 12/27.  Finished doxycycline for 48 hours.   Today he tells me he feels better than last week.  Continues to have shortness of breath on exertion.  In office oxygen assessment with desaturation to 85% on exertion.  Social history: Active smoker, started smoking since the age of 32. Smokes one pack per day.   ROS All systems were reviewed and are negative except for the above.  Objective:   Vitals:   01/20/23 0941  BP: 120/88  Pulse: (!) 126  Temp: 97.6 F (36.4 C)  TempSrc: Temporal  SpO2: 90%  Weight: 275 lb 9.6 oz (125 kg)  Height: 6\' 5"  (1.956 m)   90% on RA BMI Readings from Last 3 Encounters:  01/20/23 32.68 kg/m  01/17/23 30.06 kg/m  01/10/23 34.72 kg/m   Wt Readings from Last 3 Encounters:  01/20/23 275 lb 9.6 oz (125 kg)  01/17/23 253 lb 8.5 oz (115 kg)  01/10/23 285 lb 4 oz (129.4 kg)    Physical Exam GEN: NAD, Healthy Appearing HEENT: Supple Neck, Reactive Pupils, EOMI  CVS: Normal S1, Normal S2,tachy, No murmurs or ES appreciated  Lungs: Diminished over the right base Abdomen: Soft, non tender, non distended, + BS  Extremities: Warm and well perfused, No edema   Skin: No suspicious lesions appreciated  Psych: Normal Affect  Labs and imaging were reviewed.  Ancillary Information   CBC    Component Value Date/Time   WBC 12.9 (H) 01/17/2023 0437   RBC 5.22 01/17/2023 0437   HGB 16.3 01/17/2023 0437   HGB 15.9 07/30/2019 0956   HCT 47.3 01/17/2023 0437   HCT 46.7 07/30/2019 0956   PLT 196 01/17/2023 0437   PLT 271 07/30/2019 0956   MCV 90.6 01/17/2023 0437   MCV 94 07/30/2019 0956   MCV 92 09/11/2013 0406   MCH 31.2 01/17/2023 0437   MCHC 34.5 01/17/2023 0437   RDW 13.1 01/17/2023 0437   RDW 13.3 07/30/2019 0956   RDW 12.8 09/11/2013 0406   LYMPHSABS 3.0 07/30/2019 0956   LYMPHSABS 3.3 09/11/2013 0406   MONOABS 0.7 07/27/2019 1630   MONOABS 0.4 09/11/2013 0406   EOSABS 0.3 07/30/2019 0956   EOSABS 0.1 09/11/2013 0406   BASOSABS 0.1 07/30/2019 0956   BASOSABS 0.0 09/11/2013 0406       No data to display           Assessment & Plan:  Mr. Spare is a 47 year old male patient with a past medical history of heavy tobacco use, schizoaffective disorder who is presenting today to the pulmonary clinic for an inpatient follow-up for community-acquired pneumonia.  #RLL Pneumonia  #Suspecting COPD #Subacute/chronic hypoxic respiratory failure   S/p 4 days Vanc - zosyn  and azithromycin and 2 days doxycycline finished on 01/19/2023.   []  CT chest wo contrast in 8 weeks  []  PFTs  []  Start Fluticasone-Umecledinium-Vilanterol [Trelegy] 1 puff once a day   #Oxygen assessment SATURATION QUALIFICATIONS: (This note is used to comply with regulatory documentation for home oxygen)  Patient Saturations on Room Air at Rest = 89%  Patient Saturations on Room Air while Ambulating = 85%  Patient Saturations on 2 Liters of oxygen while Ambulating = 93%   Return in about 8 weeks (around 03/17/2023) for with the ct chest.  I spent 60 minutes caring for this patient today, including preparing to see the patient, obtaining a medical history ,  reviewing a separately obtained history, performing a medically appropriate examination and/or evaluation, counseling and educating the patient/family/caregiver, ordering medications, tests, or procedures, documenting clinical information in the electronic health record, and independently interpreting results (not separately reported/billed) and communicating results to the patient/family/caregiver.  Janann Colonel, MD [place] [facility] 01/20/2023 10:34 AM

## 2023-01-22 ENCOUNTER — Encounter: Payer: Self-pay | Admitting: Family Medicine

## 2023-01-23 ENCOUNTER — Telehealth: Payer: Self-pay | Admitting: Pulmonary Disease

## 2023-01-23 ENCOUNTER — Ambulatory Visit: Payer: Medicare Other

## 2023-01-23 DIAGNOSIS — M778 Other enthesopathies, not elsewhere classified: Secondary | ICD-10-CM | POA: Diagnosis not present

## 2023-01-23 DIAGNOSIS — Q6671 Congenital pes cavus, right foot: Secondary | ICD-10-CM

## 2023-01-23 DIAGNOSIS — M7741 Metatarsalgia, right foot: Secondary | ICD-10-CM

## 2023-01-23 DIAGNOSIS — Q6672 Congenital pes cavus, left foot: Secondary | ICD-10-CM | POA: Diagnosis not present

## 2023-01-23 DIAGNOSIS — M2141 Flat foot [pes planus] (acquired), right foot: Secondary | ICD-10-CM

## 2023-01-23 NOTE — Telephone Encounter (Signed)
 The 02 order was sent to [facility] and Elease Hashimoto with [employer] confirmed they got the order but they patient has [facility] Medicare and the order was sent to [facility] to approve

## 2023-01-23 NOTE — Progress Notes (Signed)
 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   Orthotics were dispensed and fit was satisfactory. Reviewed instructions for break-in and wear. Written instructions given to patient.  Patient will follow up as needed. Lolita Schultze CPed, CFo CFm

## 2023-01-23 NOTE — Telephone Encounter (Signed)
 Patient would like an update on his oxygen. He states that it was to be ordered during his last appointment 01-20-23. Please call with an update [phone]

## 2023-01-23 NOTE — Telephone Encounter (Signed)
 I have notified the patient and I gave him the number to [employer] so he can call them for updates.  Nothing further needed.

## 2023-01-24 NOTE — Telephone Encounter (Signed)
 Okay, thanks. I will review the labs and records.

## 2023-01-28 ENCOUNTER — Telehealth: Payer: Self-pay | Admitting: Pulmonary Disease

## 2023-01-28 NOTE — Telephone Encounter (Signed)
 [facility] calling for   Clinical Notes RX for 02 Supported medical Documentation  Fax # is : [phone]

## 2023-01-29 NOTE — Telephone Encounter (Signed)
 nfn

## 2023-01-29 NOTE — Telephone Encounter (Signed)
 02 referral, notes have been faxed to [facility]

## 2023-01-30 ENCOUNTER — Telehealth: Payer: Self-pay | Admitting: Pulmonary Disease

## 2023-01-30 DIAGNOSIS — J4489 Other specified chronic obstructive pulmonary disease: Secondary | ICD-10-CM

## 2023-01-30 DIAGNOSIS — R0602 Shortness of breath: Secondary | ICD-10-CM

## 2023-01-30 MED ORDER — BREZTRI AEROSPHERE 160-9-4.8 MCG/ACT IN AERO: 2.0000 | INHALATION_SPRAY | Freq: Two times a day (BID) | RESPIRATORY_TRACT | 3 refills | Status: DC

## 2023-01-30 NOTE — Telephone Encounter (Signed)
 Called and spoke to patient.  He stated that since starting trelegy, he has developed puffy eyes and itching all over. He stated taking trelegy on 12/31 and sx started 2 days. He has not taken any OTC meds to help with sx. Denied increased, trouble swallowing or additional sx.  He stopped trelegy yesterday with no improvement in sx. Advised pt to continuing holding trelegy.    JP, please advise. Thanks

## 2023-01-30 NOTE — Telephone Encounter (Signed)
 Patient states currently taking Trelegy. States is itching all over and eyes are puffy. Stopped taking the medication. Pharmacy is [facility] [place]. Patient phone number is [phone].

## 2023-01-30 NOTE — Telephone Encounter (Signed)
 Patient calling with reaction to trelegy (Hives and puffy eyes.) Trelegy discontinued and Jeffrey Lin was ordered.

## 2023-01-31 ENCOUNTER — Other Ambulatory Visit (HOSPITAL_COMMUNITY): Payer: Self-pay

## 2023-01-31 MED ORDER — FLUTICASONE-SALMETEROL 250-50 MCG/ACT IN AEPB: 1.0000 | INHALATION_SPRAY | Freq: Two times a day (BID) | RESPIRATORY_TRACT | 3 refills | Status: DC

## 2023-01-31 NOTE — Telephone Encounter (Signed)
 ATC the patient. I did not get an answer or a voicemail. I will try the patient again later.

## 2023-01-31 NOTE — Telephone Encounter (Signed)
 I have notified the patient. Nothing further needed.

## 2023-01-31 NOTE — Telephone Encounter (Signed)
 I spoke with the patient. He said after stopping the Trelegy and taking Benadryl  his symptoms resolved. He said he did get a message from his pharmacy saying the Breztri  cost $294.He said he can not afford that.     Pharmacy team can you see what a cheaper alternative to the Breztri  would be? Thank you!

## 2023-01-31 NOTE — Telephone Encounter (Signed)
 Test claims result in the following. Do keep in mind that patient has a deductible to meet before co-pays may lower.   Breztri  - $293.69 (possible $47.00 co-pay after deductible)  ICS+LABA Advair HFA - Brand non-formulary  Generic non-formulary Advair Diskus - Brand non-formulary  Generic $82.52 Wixela Inhub - $82.52 Breo Ellipta  - $293.69 Dulera - $344.66 Symbicort - Brand $235.92  Generic non-formulary Breyna - Non-formulary  LAMA Incruse Ellipta - $293.69 Spiriva  HandiHaler - Brand $293.69  Generic non-formulary Spiriva  Respimat - $293.69 Tudorza - Non-formulary  LABA+LAMA Anoro Ellipta - $293.69 Stiolto - $293.69 Bevespi - $293.69  ICS Alvesco - Non-formulary Arnuity Ellipta  - $210.03 Asmanex HFA - Non-formulary Fluticasone  HFA - Non-formulary Pulmicort  Flexhaler - Non-formulary Qvar Redihaler - $220.58

## 2023-02-01 ENCOUNTER — Encounter: Payer: Self-pay | Admitting: Pulmonary Disease

## 2023-02-01 DIAGNOSIS — J4489 Other specified chronic obstructive pulmonary disease: Secondary | ICD-10-CM

## 2023-02-01 DIAGNOSIS — J439 Emphysema, unspecified: Secondary | ICD-10-CM

## 2023-02-03 ENCOUNTER — Telehealth: Payer: Self-pay | Admitting: Pulmonary Disease

## 2023-02-03 ENCOUNTER — Other Ambulatory Visit: Payer: Self-pay | Admitting: Family Medicine

## 2023-02-03 DIAGNOSIS — R0602 Shortness of breath: Secondary | ICD-10-CM

## 2023-02-03 NOTE — Telephone Encounter (Addendum)
 Patient also sent a patient advise request on 1/11. It has been forwarded to Dr. Larinda Buttery.  Closing this encounter.

## 2023-02-03 NOTE — Telephone Encounter (Signed)
 Last office visit 01/10/2023 for bilateral leg pain.  Last refilled 11/12/22 for #12 with no refills. Vit D level 11/05/22 which was low at 18.77 ng/mL.  Next Appt: 02/12/23 for prediabetes.

## 2023-02-03 NOTE — Telephone Encounter (Signed)
 Patient is taking Stevan Born and is having a reaction to it; itching just like the Trelegy did. Patient would like an alternative.

## 2023-02-04 ENCOUNTER — Other Ambulatory Visit: Payer: Self-pay | Admitting: Pulmonary Disease

## 2023-02-04 DIAGNOSIS — R0602 Shortness of breath: Secondary | ICD-10-CM

## 2023-02-04 MED ORDER — FLUTICASONE-SALMETEROL 115-21 MCG/ACT IN AERO: 2.0000 | INHALATION_SPRAY | Freq: Two times a day (BID) | RESPIRATORY_TRACT | 12 refills | Status: DC

## 2023-02-04 NOTE — Addendum Note (Signed)
 Addended by: Janann Colonel on: 02/04/2023 10:16 AM   Modules accepted: Orders

## 2023-02-04 NOTE — Telephone Encounter (Signed)
 Per Dr. Larinda Buttery, he has sent in another inhaler.  The patient is aware.  Nothing further needed.

## 2023-02-04 NOTE — Telephone Encounter (Signed)
 JP, please advise. Advair is not covered, covered alternative is Symbicort.

## 2023-02-04 NOTE — Telephone Encounter (Signed)
 Yes, Delray Alt can you send Symbicort 160-4.5 2puffs bid. Thanks.

## 2023-02-05 ENCOUNTER — Other Ambulatory Visit: Payer: Self-pay | Admitting: Pulmonary Disease

## 2023-02-05 DIAGNOSIS — R0602 Shortness of breath: Secondary | ICD-10-CM

## 2023-02-05 NOTE — Telephone Encounter (Addendum)
Dr. Larinda Buttery, this is what the pharmacy found when they ran his insurance on 01/31/2023. Please advise. He can not take Trelegy or Wixela due to itching.     Test claims result in the following. Do keep in mind that patient has a deductible to meet before co-pays may lower.    Breztri - $293.69 (possible $47.00 co-pay after deductible)   ICS+LABA Advair HFA - Brand non-formulary  Generic non-formulary Advair Diskus - Brand non-formulary  Generic $82.52 Wixela Inhub - $82.52 Breo Ellipta - $293.69 Dulera - $344.66 Symbicort - Brand $235.92  Generic non-formulary Breyna - Non-formulary   LAMA Incruse Ellipta - $293.69 Spiriva HandiHaler - Brand $293.69  Generic non-formulary Spiriva Respimat - $293.69 Tudorza - Non-formulary   LABA+LAMA Anoro Ellipta - $293.69 Stiolto - $293.69 Bevespi - $293.69   ICS Alvesco - Non-formulary Arnuity Ellipta - $210.03 Asmanex HFA - Non-formulary Fluticasone HFA - Non-formulary Pulmicort Flexhaler - Non-formulary Qvar Redihaler - $220.58

## 2023-02-05 NOTE — Telephone Encounter (Signed)
 Patient is aware of medication change and voiced his understanding. Symbicort sent to preferred pharmacy.  Nothing further needed.

## 2023-02-06 ENCOUNTER — Encounter: Payer: Self-pay | Admitting: *Deleted

## 2023-02-08 ENCOUNTER — Other Ambulatory Visit: Payer: Self-pay | Admitting: Pulmonary Disease

## 2023-02-09 ENCOUNTER — Other Ambulatory Visit: Payer: Self-pay | Admitting: Family Medicine

## 2023-02-10 NOTE — Telephone Encounter (Signed)
Last office visit 01/10/2023 for leg pain.  Last refilled 11/12/22 for #270 with no refills.  Next Appt: 02/12/23 for prediabetes.

## 2023-02-12 ENCOUNTER — Ambulatory Visit: Payer: Medicare Other | Admitting: Family Medicine

## 2023-02-18 NOTE — Telephone Encounter (Signed)
Pharmacy team can you see what nebulizer medications the patient insurance will cover?Thank you!

## 2023-02-20 ENCOUNTER — Other Ambulatory Visit (HOSPITAL_COMMUNITY): Payer: Self-pay

## 2023-02-20 NOTE — Telephone Encounter (Addendum)
Please see the Prior Auth departments response below. You can send in the nebulizer meds. You will have to add to the prescription that they need to file it under Medicare Part B and give put the COPD code J44.9 on the script as well.  I have placed the order for the nebulizer machine to [facility].

## 2023-02-20 NOTE — Addendum Note (Signed)
Addended by: Bonney Leitz on: 02/20/2023 04:50 PM   Modules accepted: Orders

## 2023-02-20 NOTE — Telephone Encounter (Signed)
Nebulizer solutions are to be covered under Medicare Part B for this patient as it is considered DME

## 2023-02-21 ENCOUNTER — Telehealth: Payer: Self-pay | Admitting: Pulmonary Disease

## 2023-02-21 NOTE — Telephone Encounter (Signed)
We received a message from Achille with [employer] Santina Evans   I am not seeing a narrative for this neb from OV/progress note 01/20/2023. Can provider either add to that note or patient will need to be seen again.

## 2023-02-26 NOTE — Telephone Encounter (Signed)
 Per Dr. Lucina Sabal, he wants to see him in the office.  I tried to contact the patient but the phone just rang and then it said my call could not be completed. I will try again later.

## 2023-02-27 NOTE — Telephone Encounter (Signed)
 Patient called and has been scheduled.

## 2023-03-01 DIAGNOSIS — M79605 Pain in left leg: Secondary | ICD-10-CM | POA: Diagnosis not present

## 2023-03-01 DIAGNOSIS — M79604 Pain in right leg: Secondary | ICD-10-CM | POA: Diagnosis not present

## 2023-03-05 ENCOUNTER — Encounter: Payer: Self-pay | Admitting: Family Medicine

## 2023-03-17 ENCOUNTER — Ambulatory Visit
Admission: RE | Admit: 2023-03-17 | Discharge: 2023-03-17 | Disposition: A | Discharge status: Home / Self Care | Payer: Medicare Other | Source: Ambulatory Visit | Attending: Pulmonary Disease | Admitting: Pulmonary Disease

## 2023-03-17 DIAGNOSIS — R0602 Shortness of breath: Secondary | ICD-10-CM | POA: Insufficient documentation

## 2023-03-17 DIAGNOSIS — R590 Localized enlarged lymph nodes: Secondary | ICD-10-CM | POA: Diagnosis not present

## 2023-03-17 DIAGNOSIS — R918 Other nonspecific abnormal finding of lung field: Secondary | ICD-10-CM | POA: Diagnosis not present

## 2023-03-17 DIAGNOSIS — J9811 Atelectasis: Secondary | ICD-10-CM | POA: Insufficient documentation

## 2023-03-20 ENCOUNTER — Encounter: Payer: Self-pay | Admitting: Pulmonary Disease

## 2023-03-20 ENCOUNTER — Ambulatory Visit: Payer: Medicare Other | Admitting: Pulmonary Disease

## 2023-03-20 VITALS — BP 118/90 | HR 111 | Temp 98.3°F | Ht >= 80 in | Wt 260.8 lb

## 2023-03-20 DIAGNOSIS — R0602 Shortness of breath: Secondary | ICD-10-CM | POA: Diagnosis not present

## 2023-03-20 MED ORDER — FLUTICASONE-SALMETEROL 230-21 MCG/ACT IN AERO: 2.0000 | INHALATION_SPRAY | Freq: Two times a day (BID) | RESPIRATORY_TRACT | 12 refills | Status: DC

## 2023-03-20 MED ORDER — STIOLTO RESPIMAT 2.5-2.5 MCG/ACT IN AERS: 2.0000 | INHALATION_SPRAY | Freq: Every day | RESPIRATORY_TRACT | Status: DC

## 2023-03-20 MED ORDER — FLUTICASONE PROPIONATE 50 MCG/ACT NA SUSP: 1.0000 | Freq: Every day | NASAL | 2 refills | Status: AC

## 2023-03-20 NOTE — Progress Notes (Addendum)
 Synopsis: Referred in by Excell Seltzer, MD   Subjective:   PATIENT ID: Jeffrey Lin GENDER: male DOB: Nov 20, 1975, MRN: [id number]  Chief Complaint  Patient presents with   Follow-up    HPI [name] is a 48 year old male patient with a past medical history of heavy tobacco use, schizoaffective disorder who is presenting today to the pulmonary clinic for an inpatient follow-up for community-acquired pneumonia.   He presented to [facility] last week with hypoxic respiratory failure requiring high flow nasal cannula.  He was found to have a large right-sided consolidation on CT chest 12/24.  MRSA PCR positive.  Started on vancomycin Zosyn and azithromycin12/24.  He left AMA on 12/27.  Finished doxycycline for 48 hours.    Today he tells me he feels better than last week.  Continues to have shortness of breath on exertion.   In office oxygen assessment with desaturation to 85% on exertion.   Social history: Active smoker, started smoking since the age of 45. Smokes one pack per day.    ROS All systems were reviewed and are negative except for the above.   ROS All systems were reviewed and are negative except for the above.  Objective:   Vitals:   03/20/23 1454 03/20/23 1500  BP: (!) 118/90   Pulse: (!) 120 (!) 111  Temp: 98.3 F (36.8 C)   TempSrc: Oral   SpO2: (!) 85% 95%  Weight: 260 lb 12.8 oz (118.3 kg)   Height: 6\' 8"  (2.032 m)    95% on 3 LPM  BMI Readings from Last 3 Encounters:  03/20/23 28.65 kg/m  01/20/23 32.68 kg/m  01/17/23 30.06 kg/m   Wt Readings from Last 3 Encounters:  03/20/23 260 lb 12.8 oz (118.3 kg)  01/20/23 275 lb 9.6 oz (125 kg)  01/17/23 253 lb 8.5 oz (115 kg)    Physical Exam GEN: NAD, Healthy Appearing HEENT: Supple Neck, Reactive Pupils, EOMI  CVS: Normal S1, Normal S2, RRR, No murmurs or ES appreciated  Lungs: Clear bilateral air entry.  Abdomen: Soft, non tender, non distended, + BS  Extremities: Warm  and well perfused, No edema  Skin: No suspicious lesions appreciated  Psych: Normal Affect  Ancillary Information   CBC    Component Value Date/Time   WBC 12.9 (H) 01/17/2023 0437   RBC 5.22 01/17/2023 0437   HGB 16.3 01/17/2023 0437   HGB 15.9 07/30/2019 0956   HCT 47.3 01/17/2023 0437   HCT 46.7 07/30/2019 0956   PLT 196 01/17/2023 0437   PLT 271 07/30/2019 0956   MCV 90.6 01/17/2023 0437   MCV 94 07/30/2019 0956   MCV 92 09/11/2013 0406   MCH 31.2 01/17/2023 0437   MCHC 34.5 01/17/2023 0437   RDW 13.1 01/17/2023 0437   RDW 13.3 07/30/2019 0956   RDW 12.8 09/11/2013 0406   LYMPHSABS 3.0 07/30/2019 0956   LYMPHSABS 3.3 09/11/2013 0406   MONOABS 0.7 07/27/2019 1630   MONOABS 0.4 09/11/2013 0406   EOSABS 0.3 07/30/2019 0956   EOSABS 0.1 09/11/2013 0406   BASOSABS 0.1 07/30/2019 0956   BASOSABS 0.0 09/11/2013 0406        No data to display           Assessment & Plan:  Mr. Weidinger is a 48 year old male patient with a past medical history of heavy tobacco use, schizoaffective disorder who is presenting today to the pulmonary clinic for an inpatient follow-up for community-acquired pneumonia.   #RLL  Pneumonia resolved #Suspecting COPD #Subacute/chronic hypoxic respiratory failure    S/p 4 days Vanc - zosyn and azithromycin and 2 days doxycycline finished on 01/19/2023.    []  CT chest wo contrast in 8 weeks --> resolved consolidation - bibasilar atelectasis.  []  PFTs  []  Unable to tolerate any inhaler - will send in Nebulizer treatment. Should be on Brovana BID and Yupelri daily.   He had an episode of chest pain with increased oxygen requirement to 3 to 4L /[place] last week - He has been more tachycardic recenty. This raises concern for PE. Discussed with patient and prefers not going to the ED. Will order a STAT CTA of the chest.    #Oxygen assessment SATURATION QUALIFICATIONS: (This note is used to comply with regulatory documentation for home oxygen)  Patient  Saturations on Room Air at Rest = 89%  Patient Saturations on Room Air while Ambulating = 85%  Patient Saturations on 2 Liters of oxygen while Ambulating = 93%   Return in about 8 weeks (around 05/15/2023).  I spent 50 minutes caring for this patient today, including preparing to see the patient, obtaining a medical history , reviewing a separately obtained history, performing a medically appropriate examination and/or evaluation, counseling and educating the patient/family/caregiver, ordering medications, tests, or procedures, documenting clinical information in the electronic health record, and independently interpreting results (not separately reported/billed) and communicating results to the patient/family/caregiver  Janann Colonel, MD [facility] 03/20/2023 3:44 PM

## 2023-03-21 ENCOUNTER — Other Ambulatory Visit: Payer: Self-pay | Admitting: Family Medicine

## 2023-03-21 MED ORDER — TRAMADOL HCL 50 MG PO TABS: 50.0000 mg | ORAL_TABLET | Freq: Three times a day (TID) | ORAL | 0 refills | Status: AC | PRN

## 2023-03-24 ENCOUNTER — Ambulatory Visit
Admission: RE | Admit: 2023-03-24 | Discharge: 2023-03-24 | Disposition: A | Discharge status: Home / Self Care | Payer: Medicare Other | Source: Ambulatory Visit | Attending: Pulmonary Disease | Admitting: Pulmonary Disease

## 2023-03-24 DIAGNOSIS — R918 Other nonspecific abnormal finding of lung field: Secondary | ICD-10-CM | POA: Diagnosis not present

## 2023-03-24 DIAGNOSIS — J9811 Atelectasis: Secondary | ICD-10-CM | POA: Diagnosis not present

## 2023-03-24 DIAGNOSIS — R0602 Shortness of breath: Secondary | ICD-10-CM | POA: Insufficient documentation

## 2023-03-24 DIAGNOSIS — R9389 Abnormal findings on diagnostic imaging of other specified body structures: Secondary | ICD-10-CM | POA: Diagnosis not present

## 2023-03-24 DIAGNOSIS — J984 Other disorders of lung: Secondary | ICD-10-CM | POA: Diagnosis not present

## 2023-03-24 MED ORDER — IOHEXOL 350 MG/ML SOLN
100.0000 mL | Freq: Once | INTRAVENOUS | Status: AC | PRN
  Administered 2023-03-24: 100 mL via INTRAVENOUS

## 2023-03-24 NOTE — Telephone Encounter (Addendum)
 I spoke with the pharmacy and they said the Adviar (generic) is not coved by his insurance. Please see earlier messages in this encounter to see which medications are covered.

## 2023-03-26 MED ORDER — YUPELRI 175 MCG/3ML IN SOLN: 175.0000 ug | Freq: Every day | RESPIRATORY_TRACT | 3 refills | Status: DC

## 2023-03-26 MED ORDER — ARFORMOTEROL TARTRATE 15 MCG/2ML IN NEBU: 15.0000 ug | INHALATION_SOLUTION | Freq: Two times a day (BID) | RESPIRATORY_TRACT | 3 refills | Status: AC

## 2023-03-26 NOTE — Addendum Note (Signed)
 Addended by: Janann Colonel on: 03/26/2023 04:04 PM   Modules accepted: Orders

## 2023-03-27 ENCOUNTER — Ambulatory Visit: Payer: Medicare Other | Attending: Pulmonary Disease

## 2023-03-27 ENCOUNTER — Ambulatory Visit: Payer: Medicare Other | Admitting: Pulmonary Disease

## 2023-03-27 DIAGNOSIS — R0602 Shortness of breath: Secondary | ICD-10-CM | POA: Diagnosis not present

## 2023-03-27 DIAGNOSIS — J449 Chronic obstructive pulmonary disease, unspecified: Secondary | ICD-10-CM | POA: Insufficient documentation

## 2023-03-27 DIAGNOSIS — F17210 Nicotine dependence, cigarettes, uncomplicated: Secondary | ICD-10-CM | POA: Insufficient documentation

## 2023-03-27 DIAGNOSIS — R0609 Other forms of dyspnea: Secondary | ICD-10-CM | POA: Diagnosis not present

## 2023-03-27 LAB — PULMONARY FUNCTION TEST ARMC ONLY
DL/VA % pred: 86 %
DL/VA: 3.79 ml/min/mmHg/L
DLCO unc % pred: 48 %
DLCO unc: 17.84 ml/min/mmHg
FEF 25-75 Post: 1.37 L/s
FEF 25-75 Pre: 1.26 L/s
FEF2575-%Change-Post: 8 %
FEF2575-%Pred-Post: 31 %
FEF2575-%Pred-Pre: 28 %
FEV1-%Change-Post: 2 %
FEV1-%Pred-Post: 40 %
FEV1-%Pred-Pre: 39 %
FEV1-Post: 2.04 L
FEV1-Pre: 1.98 L
FEV1FVC-%Change-Post: 7 %
FEV1FVC-%Pred-Pre: 90 %
FEV6-%Change-Post: -4 %
FEV6-%Pred-Post: 42 %
FEV6-%Pred-Pre: 44 %
FEV6-Post: 2.67 L
FEV6-Pre: 2.79 L
FEV6FVC-%Change-Post: 0 %
FEV6FVC-%Pred-Post: 103 %
FEV6FVC-%Pred-Pre: 102 %
FVC-%Change-Post: -4 %
FVC-%Pred-Post: 41 %
FVC-%Pred-Pre: 43 %
FVC-Post: 2.67 L
FVC-Pre: 2.8 L
Post FEV1/FVC ratio: 76 %
Post FEV6/FVC ratio: 100 %
Pre FEV1/FVC ratio: 71 %
Pre FEV6/FVC Ratio: 100 %
RV % pred: 125 %
RV: 2.94 L
TLC % pred: 67 %
TLC: 5.67 L

## 2023-03-27 MED ORDER — ALBUTEROL SULFATE (2.5 MG/3ML) 0.083% IN NEBU
2.5000 mg | INHALATION_SOLUTION | Freq: Once | RESPIRATORY_TRACT | Status: AC
  Administered 2023-03-27: 2.5 mg via RESPIRATORY_TRACT
  Filled 2023-03-27: qty 3

## 2023-03-31 ENCOUNTER — Other Ambulatory Visit: Payer: Self-pay

## 2023-03-31 ENCOUNTER — Telehealth: Payer: Self-pay | Admitting: Pulmonary Disease

## 2023-03-31 DIAGNOSIS — R0602 Shortness of breath: Secondary | ICD-10-CM

## 2023-03-31 DIAGNOSIS — J439 Emphysema, unspecified: Secondary | ICD-10-CM

## 2023-03-31 DIAGNOSIS — I272 Pulmonary hypertension, unspecified: Secondary | ICD-10-CM

## 2023-03-31 NOTE — Telephone Encounter (Signed)
 Patient remains with shortness of breath and oxygen requirement. Had a RHC in 2021 with mild pulmonary hypertension. Will refer to cardiology for repeat RHC.

## 2023-04-07 ENCOUNTER — Ambulatory Visit: Attending: Cardiology | Admitting: Cardiology

## 2023-04-07 ENCOUNTER — Encounter: Payer: Self-pay | Admitting: Cardiology

## 2023-04-07 VITALS — BP 93/65 | HR 93 | Ht 77.0 in | Wt 252.4 lb

## 2023-04-07 DIAGNOSIS — I214 Non-ST elevation (NSTEMI) myocardial infarction: Secondary | ICD-10-CM | POA: Diagnosis not present

## 2023-04-07 DIAGNOSIS — F17200 Nicotine dependence, unspecified, uncomplicated: Secondary | ICD-10-CM

## 2023-04-07 DIAGNOSIS — I10 Essential (primary) hypertension: Secondary | ICD-10-CM | POA: Diagnosis not present

## 2023-04-07 DIAGNOSIS — IMO0001 Reserved for inherently not codable concepts without codable children: Secondary | ICD-10-CM

## 2023-04-07 NOTE — Patient Instructions (Signed)
 Medication Instructions:  Your physician recommends that you continue on your current medications as directed. Please refer to the Current Medication list given to you today.   *If you need a refill on your cardiac medications before your next appointment, please call your pharmacy*   Lab Work: No labs ordered today    Testing/Procedures: Your provider has ordered a Lexiscan/ Exercise Myoview Stress test. This will take place at [facility]. Please report to the [facility] medical mall entrance. The volunteers at the first desk will direct you where to go.  [facility] MYOVIEW  Your provider has ordered a Stress Test with nuclear imaging. The purpose of this test is to evaluate the blood supply to your heart muscle. This procedure is referred to as a "Non-Invasive Stress Test." This is because other than having an IV started in your vein, nothing is inserted or "invades" your body. Cardiac stress tests are done to find areas of poor blood flow to the heart by determining the extent of coronary artery disease (CAD). Some patients exercise on a treadmill, which naturally increases the blood flow to your heart, while others who are unable to walk on a treadmill due to physical limitations will have a pharmacologic/chemical stress agent called Lexiscan . This medicine will mimic walking on a treadmill by temporarily increasing your coronary blood flow.   Please note: these test may take anywhere between 2-4 hours to complete  How to prepare for your Myoview test:  Nothing to eat for 6 hours prior to the test No caffeine for 24 hours prior to test No smoking 24 hours prior to test. Your medication may be taken with water.  If your doctor stopped a medication because of this test, do not take that medication. Ladies, please do not wear dresses.  Skirts or pants are appropriate. Please wear a short sleeve shirt. No perfume, cologne or lotion. Wear comfortable walking shoes. No heels!   PLEASE NOTIFY THE OFFICE AT  LEAST 24 HOURS IN ADVANCE IF YOU ARE UNABLE TO KEEP YOUR APPOINTMENT.  [phone] AND  PLEASE NOTIFY NUCLEAR MEDICINE AT [facility] AT LEAST 24 HOURS IN ADVANCE IF YOU ARE UNABLE TO KEEP YOUR APPOINTMENT. [phone]    Follow-Up: At [facility], you and your health needs are our priority.  As part of our continuing mission to provide you with exceptional heart care, we have created designated Provider Care Teams.  These Care Teams include your primary Cardiologist (physician) and Advanced Practice Providers (APPs -  Physician Assistants and Nurse Practitioners) who all work together to provide you with the care you need, when you need it.  We recommend signing up for the patient portal called "MyChart".  Sign up information is provided on this After Visit Summary.  MyChart is used to connect with patients for Virtual Visits (Telemedicine).  Patients are able to view lab/test results, encounter notes, upcoming appointments, etc.  Non-urgent messages can be sent to your provider as well.   To learn more about what you can do with MyChart, go to [email or web address].    Your next appointment:   6 -8 week(s)  Provider:   You may see Lorine Bears, MD or one of the following Advanced Practice Providers on your designated Care Team:   Nicolasa Ducking, NP Eula Listen, PA-C Cadence Fransico Michael, PA-C Charlsie Quest, NP Carlos Levering, NP

## 2023-04-07 NOTE — Progress Notes (Signed)
 Cardiology Office Note:    Date:  04/07/2023   ID:  Jeffrey Lin, DOB [birth year], MRN [id number]  PCP:  Excell Seltzer, MD   [facility] Providers Cardiologist:  Lorine Bears, MD     Referring MD: Janann Colonel, MD   No chief complaint on file.   History of Present Illness:    Jeffrey Lin is a 48 y.o. male with a hx of hypertension, current smoker x 30 years, COPD on oxygen,, schizoaffective disorder, anxiety presenting for follow-up.  Last seen by our group in the hospital 3 months ago for respiratory failure and elevated troponins peaked at 1700.  Etiology deemed demand supply mismatch.  Due to risk factors, outpatient workup recommended.  He denies chest pain, endorses shortness of breath,  Echocardiogram 12/2022 EF 50 to 55%  Past Medical History:  Diagnosis Date   Anxiety    Chronic heart failure with preserved ejection fraction (HFpEF) (HCC)    a. 02/2016 Echo: EF 55-60%; b. 04/2019 Echo: EF 60-65%, no rwma, mod reduced RV fxn, mod dil RA; c. 07/2019 RHC: RA 2, RV 39/1, PA 39/15, PCWP 7, CO 9.1, CI 3.4.   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   amantadine (SYMMETREL) 100 MG capsule Take 100 mg by mouth 2 (two)  times daily.   amphetamine-dextroamphetamine (ADDERALL) 20 MG tablet Take 20 mg by mouth in the morning, at noon, and at bedtime.   arformoterol (BROVANA) 15 MCG/2ML NEBU Take 2 mLs (15 mcg total) by nebulization in the morning and at bedtime.   aspirin EC 81 MG tablet Take 1 tablet (81 mg total) by mouth daily. Swallow whole.   atorvastatin (LIPITOR) 10 MG tablet Take 1 tablet (10 mg total) by mouth daily.   carvedilol (COREG) 25 MG tablet TAKE 1 TABLET (25 MG TOTAL) BY MOUTH TWICE A DAY WITH MEALS   Cholecalciferol (VITAMIN D3) 1.25 MG (50000 UT) CAPS TAKE 1 CAPSULE BY MOUTH ONE TIME PER WEEK   FIBER PO Take by mouth.   fluticasone (FLONASE) 50 MCG/ACT nasal spray Place 1 spray into both nostrils daily.   gabapentin (NEURONTIN) 600 MG tablet Take 1,200 mg by mouth 3 (three) times daily.    risperiDONE (RISPERDAL) 3 MG tablet Take 1 tablet (3 mg total) by mouth at bedtime.   Tiotropium Bromide-Olodaterol (STIOLTO RESPIMAT) 2.5-2.5 MCG/ACT AERS Inhale 2 puffs into the lungs daily.   tiZANidine (ZANAFLEX) 2 MG tablet TAKE 1 TABLET (2 MG TOTAL) BY MOUTH 3 (THREE) TIMES DAILY AS NEEDED FOR MUSCLE SPASMS.   traMADol (ULTRAM) 50 MG tablet Take 1 tablet (50 mg total)  by mouth every 8 (eight) hours as needed.     Allergies:   Toradol [ketorolac tromethamine], Trelegy ellipta [fluticasone-umeclidin-vilant], Wixela inhub [fluticasone-salmeterol], and Lamotrigine   Social History   Socioeconomic History   Marital status: Single    Spouse name: Not on file   Number of children: 0   Years of education: 14   Highest education level: Not on file  Occupational History   Occupation: Marketing executive    Employer: [employer]  Tobacco Use   Smoking status: Every Day    Current packs/day: 1.50    Average packs/day: 1.5 packs/day for 27.5 years (41.3 ttl pk-yrs)    Types: Cigarettes    Start date: 09/22/1995    Passive exposure: Past   Smokeless tobacco: Former   Tobacco comments:     Pt states that he is cutting back - currently 1- 1.5 ppd.  Smoking 1/2 ppd.  States 1 cigarette lasts a couple of hours.  He is trying to quit, however is not using anything to assist him.  03/20/2023 hfb  Vaping Use   Vaping status: Never Used  Substance and Sexual Activity   Alcohol use: No    Comment: previously drank heavily - none in several years   Drug use: No   Sexual activity: Not Currently  Other Topics Concern   Not on file  Social History Narrative   Lives locally w/ mother.  Does not routinely exercise.   Exercise:  None, running causes head pain   Diet:  FF once daily, + fruits and veggies, + H2O, + Soda   Lives with mom in a one story home (has 5 steps).  No children.  Currently not working - disabled due to severe anxiety.  Education: associate degree.    Right handed   One story home   Social Drivers of Health   Financial Resource Strain: Low Risk  (11/07/2022)   Overall Financial Resource Strain (CARDIA)    Difficulty of Paying Living Expenses: Not hard at all  Food Insecurity: No Food Insecurity (03/03/2023)   Received from [facility]   Hunger Vital Sign    Worried About Running Out of Food in the Last Year: Never true    Ran Out of Food in the Last Year: Never true  Transportation Needs: No Transportation Needs (03/03/2023)   Received from [facility] - Transportation    In the past 12 months, has lack of transportation kept you from medical appointments or from getting medications?: No    Lack of Transportation (Non-Medical): No  Physical Activity: Insufficiently Active (11/07/2022)   Exercise Vital Sign    Days of Exercise per Week: 3 days    Minutes of Exercise per Session: 30 min  Stress: No Stress Concern Present (11/07/2022)   [employer] of Occupational Health - Occupational Stress Questionnaire    Feeling of Stress : Not at all  Social Connections: Socially Isolated (11/07/2022)   Social  Connection and Isolation Panel [NHANES]    Frequency of Communication with Friends and Family: More than three times a week    Frequency of Social Gatherings with Friends and Family: More than three times a week    Attends Religious Services: Never    Database administrator or Organizations: No    Attends Banker Meetings: Never    Marital Status: Never married     Family History: The patient's family history includes Anxiety disorder  in his brother and mother; Bipolar disorder in his maternal grandfather and paternal grandfather; Depression in his brother and mother; Hyperlipidemia in his mother; Hypertension in his mother; Lung disease in his father; Schizophrenia in his maternal grandfather.  ROS:   Please see the history of present illness.     All other systems reviewed and are negative.  EKGs/Labs/Other Studies Reviewed:    The following studies were reviewed today:  EKG Interpretation Date/Time:  Monday April 07 2023 11:04:42 EDT Ventricular Rate:  93 PR Interval:  168 QRS Duration:  106 QT Interval:  344 QTC Calculation: 427 R Axis:   98  Text Interpretation: Normal sinus rhythm Possible Left atrial enlargement Rightward axis Incomplete right bundle branch block Confirmed by Debbe Odea ([id number]) on 04/07/2023 11:18:49 AM    Recent Labs: 01/13/2023: B Natriuretic Peptide 128.2 01/17/2023: ALT 574; BUN 23; Creatinine, Ser 0.97; Hemoglobin 16.3; Magnesium 2.0; Platelets 196; Potassium 3.9; Sodium 136  Recent Lipid Panel    Component Value Date/Time   CHOL 165 11/05/2022 0731   TRIG 277.0 (H) 11/05/2022 0731   HDL 32.60 (L) 11/05/2022 0731   CHOLHDL 5 11/05/2022 0731   VLDL 55.4 (H) 11/05/2022 0731   LDLCALC 77 11/05/2022 0731   LDLDIRECT 167.0 11/02/2021 0939     Risk Assessment/Calculations:             Physical Exam:    VS:  BP 93/65 (BP Location: Left Arm, Patient Position: Sitting, Cuff Size: Normal)   Pulse 93   Ht 6\' 5"  (1.956 m)    Wt 252 lb 6.4 oz (114.5 kg)   SpO2 92%   BMI 29.93 kg/m     Wt Readings from Last 3 Encounters:  04/07/23 252 lb 6.4 oz (114.5 kg)  03/20/23 260 lb 12.8 oz (118.3 kg)  01/20/23 275 lb 9.6 oz (125 kg)     GEN:  Well nourished, well developed in no acute distress HEENT: Normal NECK: No JVD; No carotid bruits CARDIAC: RRR, no murmurs, rubs, gallops RESPIRATORY: Bilateral wheezing, diminished breath sounds ABDOMEN: Soft, non-tender, non-distended MUSCULOSKELETAL:  No edema; No deformity  SKIN: Warm and dry NEUROLOGIC:  Alert and oriented x 3 PSYCHIATRIC:  Normal affect   ASSESSMENT:    1. Non-ST elevation (NSTEMI) myocardial infarction (HCC)   2. Primary hypertension   3. Smoking    PLAN:    In order of problems listed above:  NSTEMI, current smoker.  Denies chest pain, shortness of breath likely from emphysema.  Echo with EF 50 to 55%.  Obtain Lexiscan Myoview. Hypertension, BP controlled.  Continue Coreg. Smoking, cessation advised.  Follow-up after cardiac testing.      Informed Consent   Shared Decision Making/Informed Consent The risks [chest pain, shortness of breath, cardiac arrhythmias, dizziness, blood pressure fluctuations, myocardial infarction, stroke/transient ischemic attack, nausea, vomiting, allergic reaction, radiation exposure, metallic taste sensation and life-threatening complications (estimated to be 1 in 10,000)], benefits (risk stratification, diagnosing coronary artery disease, treatment guidance) and alternatives of a nuclear stress test were discussed in detail with Jeffrey Lin and he agrees to proceed.       Medication Adjustments/Labs and Tests Ordered: Current medicines are reviewed at length with the patient today.  Concerns regarding medicines are outlined above.  Orders Placed This Encounter  Procedures   NM Myocar Multi W/Spect W/Wall Motion / EF   EKG 12-Lead   No orders of the defined types were placed in this encounter.   Patient  Instructions  Medication Instructions:  Your physician recommends that you continue on your current medications as directed. Please refer to the Current Medication list given to you today.   *If you need a refill on your cardiac medications before your next appointment, please call your pharmacy*   Lab Work: No labs ordered today    Testing/Procedures: Your provider has ordered a Lexiscan/ Exercise Myoview Stress test. This will take place at [facility]. Please report to the [facility] medical mall entrance. The volunteers at the first desk will direct you where to go.  [facility] MYOVIEW  Your provider has ordered a Stress Test with nuclear imaging. The purpose of this test is to evaluate the blood supply to your heart muscle. This procedure is referred to as a "Non-Invasive Stress Test." This is because other than having an IV started in your vein, nothing is inserted or "invades" your body. Cardiac stress tests are done to find areas of poor blood flow to the heart by determining the extent of coronary artery disease (CAD). Some patients exercise on a treadmill, which naturally increases the blood flow to your heart, while others who are unable to walk on a treadmill due to physical limitations will have a pharmacologic/chemical stress agent called Lexiscan . This medicine will mimic walking on a treadmill by temporarily increasing your coronary blood flow.   Please note: these test may take anywhere between 2-4 hours to complete  How to prepare for your Myoview test:  Nothing to eat for 6 hours prior to the test No caffeine for 24 hours prior to test No smoking 24 hours prior to test. Your medication may be taken with water.  If your doctor stopped a medication because of this test, do not take that medication. Ladies, please do not wear dresses.  Skirts or pants are appropriate. Please wear a short sleeve shirt. No perfume, cologne or lotion. Wear comfortable walking shoes. No heels!   PLEASE NOTIFY  THE OFFICE AT LEAST 24 HOURS IN ADVANCE IF YOU ARE UNABLE TO KEEP YOUR APPOINTMENT.  [phone] AND  PLEASE NOTIFY NUCLEAR MEDICINE AT [facility] AT LEAST 24 HOURS IN ADVANCE IF YOU ARE UNABLE TO KEEP YOUR APPOINTMENT. [phone]    Follow-Up: At [facility], you and your health needs are our priority.  As part of our continuing mission to provide you with exceptional heart care, we have created designated Provider Care Teams.  These Care Teams include your primary Cardiologist (physician) and Advanced Practice Providers (APPs -  Physician Assistants and Nurse Practitioners) who all work together to provide you with the care you need, when you need it.  We recommend signing up for the patient portal called "MyChart".  Sign up information is provided on this After Visit Summary.  MyChart is used to connect with patients for Virtual Visits (Telemedicine).  Patients are able to view lab/test results, encounter notes, upcoming appointments, etc.  Non-urgent messages can be sent to your provider as well.   To learn more about what you can do with MyChart, go to [email or web address].    Your next appointment:   6 -8 week(s)  Provider:   You may see Lorine Bears, MD or one of the following Advanced Practice Providers on your designated Care Team:   Nicolasa Ducking, NP Eula Listen, PA-C Cadence Fransico Michael, PA-C Charlsie Quest, NP Carlos Levering, NP       Signed, Debbe Odea, MD  04/07/2023 12:51 PM    [facility]

## 2023-04-15 ENCOUNTER — Encounter
Admission: RE | Admit: 2023-04-15 | Discharge: 2023-04-15 | Disposition: A | Discharge status: Home / Self Care | Source: Ambulatory Visit | Attending: Cardiology | Admitting: Cardiology

## 2023-04-15 DIAGNOSIS — I214 Non-ST elevation (NSTEMI) myocardial infarction: Secondary | ICD-10-CM | POA: Insufficient documentation

## 2023-04-15 LAB — NM MYOCAR MULTI W/SPECT W/WALL MOTION / EF
LV dias vol: 105 mL (ref 62–150)
LV sys vol: 38 mL
MPHR: 174 {beats}/min
Nuc Stress EF: 64 %
Peak HR: 122 {beats}/min
Percent HR: 70 %
Rest HR: 87 {beats}/min
Rest Nuclear Isotope Dose: 10.8 mCi
SDS: 1
SRS: 21
SSS: 12
ST Depression (mm): 0 mm
Stress Nuclear Isotope Dose: 32.6 mCi
TID: 0.85

## 2023-04-15 MED ORDER — TECHNETIUM TC 99M TETROFOSMIN IV KIT
30.0000 | PACK | Freq: Once | INTRAVENOUS | Status: AC | PRN
  Administered 2023-04-15: 32.56 via INTRAVENOUS

## 2023-04-15 MED ORDER — TECHNETIUM TC 99M TETROFOSMIN IV KIT
10.0000 | PACK | Freq: Once | INTRAVENOUS | Status: AC | PRN
  Administered 2023-04-15: 10.76 via INTRAVENOUS

## 2023-04-15 MED ORDER — REGADENOSON 0.4 MG/5ML IV SOLN
0.4000 mg | Freq: Once | INTRAVENOUS | Status: AC
  Administered 2023-04-15: 0.4 mg via INTRAVENOUS

## 2023-04-23 ENCOUNTER — Encounter: Payer: Self-pay | Admitting: Pulmonary Disease

## 2023-04-23 ENCOUNTER — Ambulatory Visit: Admitting: Pulmonary Disease

## 2023-04-23 VITALS — BP 128/64 | HR 102 | Temp 97.8°F | Ht 77.0 in | Wt 254.6 lb

## 2023-04-23 DIAGNOSIS — J4489 Other specified chronic obstructive pulmonary disease: Secondary | ICD-10-CM | POA: Diagnosis not present

## 2023-04-23 DIAGNOSIS — J9611 Chronic respiratory failure with hypoxia: Secondary | ICD-10-CM

## 2023-04-23 DIAGNOSIS — J449 Chronic obstructive pulmonary disease, unspecified: Secondary | ICD-10-CM | POA: Diagnosis not present

## 2023-04-23 DIAGNOSIS — F17210 Nicotine dependence, cigarettes, uncomplicated: Secondary | ICD-10-CM | POA: Diagnosis not present

## 2023-04-23 DIAGNOSIS — Z9981 Dependence on supplemental oxygen: Secondary | ICD-10-CM | POA: Diagnosis not present

## 2023-04-23 DIAGNOSIS — R0602 Shortness of breath: Secondary | ICD-10-CM

## 2023-04-23 MED ORDER — NICOTINE POLACRILEX 2 MG MT LOZG
2.0000 mg | LOZENGE | OROMUCOSAL | 3 refills | Status: AC | PRN
Start: 2023-04-23 — End: 2023-07-22

## 2023-04-23 MED ORDER — SPIRIVA RESPIMAT 2.5 MCG/ACT IN AERS
2.0000 | INHALATION_SPRAY | Freq: Every day | RESPIRATORY_TRACT | Status: DC
Start: 2023-04-23 — End: 2023-11-19

## 2023-04-23 MED ORDER — NICOTINE 14 MG/24HR TD PT24: 14.0000 mg | MEDICATED_PATCH | TRANSDERMAL | 0 refills | Status: DC

## 2023-04-23 MED ORDER — ALBUTEROL SULFATE HFA 108 (90 BASE) MCG/ACT IN AERS: 2.0000 | INHALATION_SPRAY | Freq: Four times a day (QID) | RESPIRATORY_TRACT | 2 refills | Status: AC | PRN

## 2023-04-23 NOTE — Progress Notes (Addendum)
 Synopsis: Referred in by Judithann Novas, MD   Subjective:   PATIENT ID: Jeffrey Lin GENDER: male DOB: 12-06-1975, MRN: [id number]  Chief Complaint  Patient presents with   Follow-up    SOB with exertion and occ at rest and non prod cough    HPI Jeffrey Lin is a 48 year old male patient with a past medical history of heavy tobacco use, schizoaffective disorder who is presenting today to the pulmonary clinic for an inpatient follow-up for community-acquired pneumonia.   He presented to [facility] last week with hypoxic respiratory failure requiring high flow nasal cannula.  He was found to have a large right-sided consolidation on CT chest 12/24.  MRSA PCR positive.  Started on vancomycin Zosyn and azithromycin12/24.  He left AMA on 12/27.  Finished doxycycline for 48 hours.    Patient continues to improve, given stiolto during last visit for one month and is well tolerated. He is waiting his nebulizer machine to start brovana. Provided samples for spiriva for backup.    Social history: Active smoker, started smoking since the age of 40. Smokes one pack per day.    ROS All systems were reviewed and are negative except for the above.   ROS All systems were reviewed and are negative except for the above.  Objective:   Vitals:   04/23/23 0949  BP: 128/64  Pulse: (!) 102  Temp: 97.8 F (36.6 C)  TempSrc: Temporal  SpO2: 93%  Weight: 254 lb 9.6 oz (115.5 kg)  Height: 6\' 5"  (1.956 m)   93% on room air.  BMI Readings from Last 3 Encounters:  04/23/23 30.19 kg/m  04/07/23 29.93 kg/m  03/20/23 28.65 kg/m   Wt Readings from Last 3 Encounters:  04/23/23 254 lb 9.6 oz (115.5 kg)  04/07/23 252 lb 6.4 oz (114.5 kg)  03/20/23 260 lb 12.8 oz (118.3 kg)    Physical Exam GEN: NAD, Healthy Appearing HEENT: Supple Neck, Reactive Pupils, EOMI  CVS: Normal S1, Normal S2, RRR, No murmurs or ES appreciated  Lungs: Clear bilateral air entry.  Abdomen: Soft,  non tender, non distended, + BS  Extremities: Warm and well perfused, No edema  Skin: No suspicious lesions appreciated  Psych: Normal Affect  Ancillary Information   CBC    Component Value Date/Time   WBC 12.9 (H) 01/17/2023 0437   RBC 5.22 01/17/2023 0437   HGB 16.3 01/17/2023 0437   HGB 15.9 07/30/2019 0956   HCT 47.3 01/17/2023 0437   HCT 46.7 07/30/2019 0956   PLT 196 01/17/2023 0437   PLT 271 07/30/2019 0956   MCV 90.6 01/17/2023 0437   MCV 94 07/30/2019 0956   MCV 92 09/11/2013 0406   MCH 31.2 01/17/2023 0437   MCHC 34.5 01/17/2023 0437   RDW 13.1 01/17/2023 0437   RDW 13.3 07/30/2019 0956   RDW 12.8 09/11/2013 0406   LYMPHSABS 3.0 07/30/2019 0956   LYMPHSABS 3.3 09/11/2013 0406   MONOABS 0.7 07/27/2019 1630   MONOABS 0.4 09/11/2013 0406   EOSABS 0.3 07/30/2019 0956   EOSABS 0.1 09/11/2013 0406   BASOSABS 0.1 07/30/2019 0956   BASOSABS 0.0 09/11/2013 0406       Latest Ref Rng & Units 03/27/2023    8:36 AM  PFT Results  FVC-Pre L 2.80   FVC-Predicted Pre % 43   FVC-Post L 2.67   FVC-Predicted Post % 41   Pre FEV1/FVC % % 71   Post FEV1/FCV % % 76   FEV1-Pre  L 1.98   FEV1-Predicted Pre % 39   FEV1-Post L 2.04   DLCO uncorrected ml/min/mmHg 17.84   DLCO UNC% % 48   DLVA Predicted % 86   TLC L 5.67   TLC % Predicted % 67   RV % Predicted % 125      Assessment & Plan:  Jeffrey Lin is a 48 year old male patient with a past medical history of heavy tobacco use, schizoaffective disorder who is presenting today to the pulmonary clinic for follow up on COPD Stage III B.    #RLL Pneumonia resolved #COPD Stage III B  #Subacute/chronic hypoxic respiratory failure    S/p 4 days Vanc - zosyn and azithromycin and 2 days doxycycline finished on 01/19/2023.   Continues to improve, Oxygen sat at 93% off oxygen sustained at 91% on exertion.    []  CT chest wo contrast in 8 weeks --> resolved consolidation - bibasilar atelectasis.  []  On stiolto samples, provided  Spiriva samples until he receives his Nebulizer machine and starts Brovana which was the only thing financially appropriate.  []  refer to pulmonary rehab.    #Oxygen assessment SATURATION QUALIFICATIONS: (This note is used to comply with regulatory documentation for home oxygen)  Patient Saturations on Room Air at Rest = 89%  Patient Saturations on Room Air while Ambulating = 85%  Patient Saturations on 2 Liters of oxygen while Ambulating = 93%   #Tobacco use  Continues to smoke 1/2 ppd for 30 years. Discussed for 5 minutes, strategies for smoking cessation and provided NRT.   Return in about 3 months (around 07/23/2023).  I spent 30 minutes caring for this patient today, including preparing to see the patient, obtaining a medical history , reviewing a separately obtained history, performing a medically appropriate examination and/or evaluation, counseling and educating the patient/family/caregiver, ordering medications, tests, or procedures, documenting clinical information in the electronic health record, and independently interpreting results (not separately reported/billed) and communicating results to the patient/family/caregiver  Annitta Kindler, MD [facility] 04/23/2023 10:41 AM

## 2023-04-29 ENCOUNTER — Telehealth: Payer: Self-pay | Admitting: *Deleted

## 2023-04-29 NOTE — Telephone Encounter (Signed)
 Copied from CRM [id number]. Topic: Clinical - Medication Question >> Apr 29, 2023  9:39 AM Cammy Copa D wrote: Reason for CRM: [name], RN with [facility] drug management program calling in regards to a fax sent on the 27th for 'tramadol 50 mg' regarding usage and safety. Wanted to confirm status of fax.  She can be reached at: [phone] ext: [phone]

## 2023-04-29 NOTE — Telephone Encounter (Signed)
 Spoke with [name] and advised I had not seen a fax come through on Jeffrey Lin about opioid use.  I also let her know that I do no show Tramadol on his current medication list.  She states then she will close out case.

## 2023-04-30 ENCOUNTER — Encounter: Payer: Self-pay | Admitting: Pulmonary Disease

## 2023-05-02 ENCOUNTER — Ambulatory Visit: Attending: Cardiology | Admitting: Cardiology

## 2023-05-02 ENCOUNTER — Encounter: Payer: Self-pay | Admitting: Cardiology

## 2023-05-02 VITALS — BP 136/98 | HR 105 | Ht 77.0 in | Wt 255.4 lb

## 2023-05-02 DIAGNOSIS — I10 Essential (primary) hypertension: Secondary | ICD-10-CM

## 2023-05-02 DIAGNOSIS — J449 Chronic obstructive pulmonary disease, unspecified: Secondary | ICD-10-CM

## 2023-05-02 DIAGNOSIS — I214 Non-ST elevation (NSTEMI) myocardial infarction: Secondary | ICD-10-CM

## 2023-05-02 DIAGNOSIS — E782 Mixed hyperlipidemia: Secondary | ICD-10-CM

## 2023-05-02 DIAGNOSIS — F17200 Nicotine dependence, unspecified, uncomplicated: Secondary | ICD-10-CM

## 2023-05-02 DIAGNOSIS — J9601 Acute respiratory failure with hypoxia: Secondary | ICD-10-CM | POA: Diagnosis not present

## 2023-05-02 NOTE — Progress Notes (Signed)
 Cardiology Office Note:  .   Date:  05/02/2023  ID:  Jeffrey Lin, DOB 19-Feb-1975, MRN [id number] PCP: Excell Seltzer, MD  [facility] Providers Cardiologist:  Lorine Bears, MD    History of Present Illness: .   Jeffrey Lin is a 48 y.o. male with past medical history of anxiety, depression, chronic pain, schizoaffective disorder, hypertension, and obesity, who is here today for follow-up.   Previously was evaluated by cardiology in 07/2014 for same atypical chest pain.  Echocardiogram in 02/2016 showed normal LV function.  In 04/2018 was hospitalized with lower extremity cellulitis and swelling.  Repeat echo showed normal LVEF without significant valvular disease.  He established for outpatient care in 07/2019 and continues to have bilateral lower extremity edema, blisters, oozing, and stasis dermatitis.  He was maintained on oral torsemide and lymphedema pumps were ordered.  Right heart catheterization in 07/2018 showed relatively normal filling pressures with mild PAH (39/15) and he was referred for sleep study.  He was admitted to [facility] 01/13/2023 and was seen by our group for evaluating high-sensitivity troponin in the setting of sepsis and aspiration pneumonia.  Echocardiogram was completed which showed low normal LV systolic function with an EF of 50-55% with no clear wall motion abnormalities but suboptimal imaging overall.  Elevated high-sensitivity troponin was thought to be supply/demand mismatch with no convincing evidence of acute coronary syndrome.  He was continued on low-dose aspirin.  Heparin was discontinued.  Given his risk factors recommend outpatient ischemic cardiac evaluation for restratification after acute illness and improved.   He was last seen 3/17/425 by Dr.Agbor-Etang.  He denied any chest pain but continued to endorse shortness of breath was on chronic oxygen therapy.  He was scheduled for [facility] for continued evaluation after elevated high-sensitivity  troponins during his recent hospitalization.  There were no other medication changes that were made.  He returns to clinic today accompanied by family member.  He continues to have shortness of breath that has improved since he has pneumonia that is resolved previously.,  Partially collapsed lung.  He is only requiring oxygen therapy at 3 L at night.  He denies any chest pain or palpitations.  Has occasional peripheral edema to his lower extremities.  Continues to smoke but stated he is currently smoking 1 packs/day down to half a pack per day.  He is working on stopping and just started on nicotine patches yesterday.  States he continues to follow with pulmonary and had several adjustments to inhalers and nebulizers.  States that he has been compliant with his current medication regimen without any adverse side effects.  Denies any recent hospitalizations or visits to the emergency department.  ROS: 10 point review of system has been reviewed and considered negative except ones are listed in the HPI  Studies Reviewed: Marland Kitchen       Lexiscan Myoview 04/15/23   The study is normal. The study is low risk.   No ST deviation was noted.   LV perfusion is normal. There is no evidence of ischemia. There is no evidence of infarction.   Left ventricular function is normal. Nuclear stress EF: 64%. End diastolic cavity size is normal. End systolic cavity size is normal.   CT attenuation images showed no evidence of aortic or coronary calcifications.   2D echo 01/14/2023: 1. Left ventricular ejection fraction, by estimation, is 50 to 55%. The  left ventricle has low normal function. Left ventricular endocardial  border not optimally defined to  evaluate regional wall motion. There is  mild left ventricular hypertrophy. Left  ventricular diastolic parameters are consistent with Grade I diastolic  dysfunction (impaired relaxation).   2. Right ventricular systolic function is normal. The right ventricular  size is  normal.   3. The mitral valve is normal in structure. No evidence of mitral valve  regurgitation. No evidence of mitral stenosis.   4. The aortic valve is normal in structure. Aortic valve regurgitation is  not visualized. No aortic stenosis is present.   5. The inferior vena cava is normal in size with greater than 50%  respiratory variability, suggesting right atrial pressure of 3 mmHg.  Risk Assessment/Calculations:     Physical Exam:   VS:  BP (!) 136/98   Pulse (!) 105   Ht 6\' 5"  (1.956 m)   Wt 255 lb 6.4 oz (115.8 kg)   SpO2 96%   BMI 30.29 kg/m    Wt Readings from Last 3 Encounters:  05/02/23 255 lb 6.4 oz (115.8 kg)  04/23/23 254 lb 9.6 oz (115.5 kg)  04/07/23 252 lb 6.4 oz (114.5 kg)    GEN: Well nourished, well developed in no acute distress NECK: No JVD; No carotid bruits CARDIAC: RRR, tachycardic, no murmurs, rubs, gallops RESPIRATORY:  Clear to auscultation without rales, wheezing or rhonchi  ABDOMEN: Soft, non-tender, obese,non-distended EXTREMITIES:  No edema; No deformity   ASSESSMENT AND PLAN: .   Prior NSTEMI during hospitalization several months ago when he was in respiratory failure with elevated troponins that peaked at 1700.  Etiology deemed demand supply mismatch.  Due to risk factors an outpatient workup was recommended.  He underwent Lexiscan Myoview which showed no infarct and no ischemia.  He continues to remain chest pain-free today.  Prior echocardiogram completed revealed LVEF 50-55%, G1 DD, normal RVSF, and no valvular abnormalities noted.  Continued on aspirin 81 mg daily and atorvastatin 10 mg daily.  Primary hypertension with blood pressure 136/98.  He is continued on carvedilol 25 daily.  He has been encouraged to monitor his pressures 1 to 2 hours postmedication administration at home as well.  Mixed hyperlipidemia with an LDL of 77 in 10/2022.  Has been continued on atorvastatin 10 mg daily.  Was placed on low-dose statin therapy due to  transaminitis during recent hospitalization.  Will need repeat lipid and hepatic panel on return if not completed by PCP.  Goal for LDL would be less than 70.  Tobacco abuse where he is down from 1/2 packs/day to half a pack per day.  Continued on NicoDerm patches.  Total cessation is recommended.  COPD stage IIIb/chronic hypoxic respiratory failure where he continues to improve.  Continues with oxygen at 3 L at bedtime.  Recently was followed by pulmonary and was placed on new inhalers and nebulizers.   Cardiac Rehabilitation Eligibility Assessment         Dispo: Patient return to clinic to see me/APP in 6 months or sooner if needed for evaluation of current symptoms.  Signed, Margarita Croke, NP

## 2023-05-02 NOTE — Patient Instructions (Signed)
 Medication Instructions:  No changes at this time.   *If you need a refill on your cardiac medications before your next appointment, please call your pharmacy*  Lab Work: None  If you have labs (blood work) drawn today and your tests are completely normal, you will receive your results only by: MyChart Message (if you have MyChart) OR A paper copy in the mail If you have any lab test that is abnormal or we need to change your treatment, we will call you to review the results.  Testing/Procedures: None  Follow-Up: At [facility], you and your health needs are our priority.  As part of our continuing mission to provide you with exceptional heart care, our providers are all part of one team.  This team includes your primary Cardiologist (physician) and Advanced Practice Providers or APPs (Physician Assistants and Nurse Practitioners) who all work together to provide you with the care you need, when you need it.  Your next appointment:   6 month(s)  Provider:   Lorine Bears, MD or Charlsie Quest, NP

## 2023-05-12 ENCOUNTER — Encounter: Payer: Self-pay | Admitting: Family Medicine

## 2023-05-19 ENCOUNTER — Ambulatory Visit: Payer: Medicare Other | Admitting: Pulmonary Disease

## 2023-05-19 ENCOUNTER — Encounter: Payer: Self-pay | Admitting: Pulmonary Disease

## 2023-05-22 ENCOUNTER — Other Ambulatory Visit: Payer: Self-pay | Admitting: Pulmonary Disease

## 2023-05-22 DIAGNOSIS — R0602 Shortness of breath: Secondary | ICD-10-CM

## 2023-05-26 ENCOUNTER — Ambulatory Visit: Admitting: Cardiology

## 2023-05-29 ENCOUNTER — Ambulatory Visit: Admitting: Cardiology

## 2023-06-02 DIAGNOSIS — M3501 Sicca syndrome with keratoconjunctivitis: Secondary | ICD-10-CM | POA: Diagnosis not present

## 2023-06-02 DIAGNOSIS — H0259 Other disorders affecting eyelid function: Secondary | ICD-10-CM | POA: Diagnosis not present

## 2023-06-03 ENCOUNTER — Telehealth: Payer: Self-pay

## 2023-06-03 DIAGNOSIS — R0602 Shortness of breath: Secondary | ICD-10-CM

## 2023-06-03 NOTE — Telephone Encounter (Signed)
 Copied from CRM [id number]. Topic: Clinical - Prescription Issue >> Jun 03, 2023  8:43 AM Hilton Lucky wrote: Reason for CRM: Patient is calling in to state that he needs an alternative to his Yulperi prescription for his nebulizer. Patient states that he picked it up from the pharmacy initially for $300 and thought it was for a 3 month supply,as quantity listed was 90. Patient states $300 is too expensive for him to pay for a month supply and is asking for a price alternative or assistance. Please reach out to patient.

## 2023-06-04 MED ORDER — YUPELRI 175 MCG/3ML IN SOLN: 175.0000 ug | Freq: Every day | RESPIRATORY_TRACT | 0 refills | Status: DC

## 2023-06-04 MED ORDER — YUPELRI 175 MCG/3ML IN SOLN: 175.0000 ug | Freq: Every day | RESPIRATORY_TRACT | 12 refills | Status: DC

## 2023-06-05 NOTE — Telephone Encounter (Signed)
 Just an [place]

## 2023-06-05 NOTE — Telephone Encounter (Deleted)
 Jeffrey Lin

## 2023-06-16 ENCOUNTER — Other Ambulatory Visit: Payer: Self-pay | Admitting: Family Medicine

## 2023-06-30 MED ORDER — YUPELRI 175 MCG/3ML IN SOLN: 175.0000 ug | Freq: Every day | RESPIRATORY_TRACT | 0 refills | Status: DC

## 2023-06-30 NOTE — Addendum Note (Signed)
 Addended by: Pepper Boyer on: 06/30/2023 04:06 PM   Modules accepted: Orders

## 2023-06-30 NOTE — Telephone Encounter (Signed)
 Per secure chat with Dr. Lucina Sabal- can send in Pantoprazole  40mg  daily for reflux.  I notified the patient. He said he is already on a PPI and this does not feel like reflux. He said he can deal with it.   I told him if it gets worse or any thing changes he needs to go to the ED. He agrees.  Nothing further needed.

## 2023-06-30 NOTE — Telephone Encounter (Addendum)
 I spoke with the patient. I have left him some samples at the front desk. I told him I am sending his message to Dr. Lucina Sabal who is working in the ICU, and he will respond as soon as he can.

## 2023-06-30 NOTE — Telephone Encounter (Signed)
 Per secure chat with Dr. Lucina Sabal- patient needs to go to the ED.  I notified the patient. He said the pain is not in his chest. It is below his sternum. No falls. He just woke up with pain. He does not want to go to the ED.

## 2023-07-01 NOTE — Addendum Note (Signed)
 Addended by: Annitta Kindler on: 07/01/2023 04:36 PM   Modules accepted: Orders

## 2023-07-02 ENCOUNTER — Ambulatory Visit
Admission: RE | Admit: 2023-07-02 | Discharge: 2023-07-02 | Disposition: A | Discharge status: Home / Self Care | Source: Ambulatory Visit | Attending: Pulmonary Disease | Admitting: Pulmonary Disease

## 2023-07-02 ENCOUNTER — Encounter
Admission: RE | Admit: 2023-07-02 | Discharge: 2023-07-02 | Disposition: A | Discharge status: Home / Self Care | Source: Home / Self Care | Attending: Pulmonary Disease | Admitting: Pulmonary Disease

## 2023-07-02 DIAGNOSIS — R9389 Abnormal findings on diagnostic imaging of other specified body structures: Secondary | ICD-10-CM | POA: Diagnosis not present

## 2023-07-02 DIAGNOSIS — J984 Other disorders of lung: Secondary | ICD-10-CM | POA: Diagnosis not present

## 2023-07-02 DIAGNOSIS — R0602 Shortness of breath: Secondary | ICD-10-CM | POA: Diagnosis not present

## 2023-07-02 DIAGNOSIS — R079 Chest pain, unspecified: Secondary | ICD-10-CM | POA: Diagnosis not present

## 2023-07-30 ENCOUNTER — Ambulatory Visit: Admitting: Pulmonary Disease

## 2023-07-30 ENCOUNTER — Encounter: Payer: Self-pay | Admitting: Pulmonary Disease

## 2023-07-30 VITALS — BP 120/80 | HR 110 | Temp 97.6°F | Ht 77.0 in | Wt 264.6 lb

## 2023-07-30 DIAGNOSIS — J449 Chronic obstructive pulmonary disease, unspecified: Secondary | ICD-10-CM | POA: Diagnosis not present

## 2023-07-30 DIAGNOSIS — J9611 Chronic respiratory failure with hypoxia: Secondary | ICD-10-CM

## 2023-07-30 DIAGNOSIS — Z9981 Dependence on supplemental oxygen: Secondary | ICD-10-CM

## 2023-07-30 DIAGNOSIS — F17210 Nicotine dependence, cigarettes, uncomplicated: Secondary | ICD-10-CM | POA: Diagnosis not present

## 2023-07-30 DIAGNOSIS — J439 Emphysema, unspecified: Secondary | ICD-10-CM

## 2023-07-30 MED ORDER — YUPELRI 175 MCG/3ML IN SOLN: 175.0000 ug | Freq: Every day | RESPIRATORY_TRACT | 0 refills | Status: DC

## 2023-07-30 MED ORDER — BUDESONIDE 0.25 MG/2ML IN SUSP: 0.2500 mg | Freq: Two times a day (BID) | RESPIRATORY_TRACT | 12 refills | Status: AC

## 2023-07-30 NOTE — Progress Notes (Unsigned)
 Synopsis: Referred in by Jeffrey Greig BRAVO, MD   Subjective:   PATIENT ID: Jeffrey Lin GENDER: male DOB: [birth year], MRN: [id number]  Chief Complaint  Patient presents with  . Follow-up    SOB, COPD, x-ray: 07/04/23, no sleep study  Sides have been hurting more. Still has a cough, SOB, with a raspy voice and his cough is typically dry and very rarely gets anything up.    HPI Jeffrey Lin is a 48 year old male patient with a past medical history of heavy tobacco use, schizoaffective disorder who is presenting today to the pulmonary clinic for an inpatient follow-up for community-acquired pneumonia.   He presented to [facility] last week with hypoxic respiratory failure requiring high flow nasal cannula.  He was found to have a large right-sided consolidation on CT chest 12/24.  MRSA PCR positive.  Started on vancomycin  Zosyn  and azithromycin12/24.  He left AMA on 12/27.  Finished doxycycline  for 48 hours.    Patient continues to improve, given stiolto during last visit for one month and is well tolerated. He is waiting his nebulizer machine to start brovana . Provided samples for spiriva  for backup.    Social history: Active smoker, started smoking since the age of 48. Smokes one pack per day.    ROS All systems were reviewed and are negative except for the above.   ROS All systems were reviewed and are negative except for the above.  Objective:   Vitals:   07/30/23 0818  BP: 120/80  Pulse: (!) 110  Temp: 97.6 F (36.4 C)  TempSrc: Oral  SpO2: 90%  Weight: 264 lb 9.6 oz (120 kg)  Height: 6' 5 (1.956 m)   90% on room air.  BMI Readings from Last 3 Encounters:  07/30/23 31.38 kg/m  05/02/23 30.29 kg/m  04/23/23 30.19 kg/m   Wt Readings from Last 3 Encounters:  07/30/23 264 lb 9.6 oz (120 kg)  05/02/23 255 lb 6.4 oz (115.8 kg)  04/23/23 254 lb 9.6 oz (115.5 kg)    Physical Exam GEN: NAD, Healthy Appearing HEENT: Supple Neck, Reactive  Pupils, EOMI  CVS: Normal S1, Normal S2, RRR, No murmurs or ES appreciated  Lungs: Clear bilateral air entry.  Abdomen: Soft, non tender, non distended, + BS  Extremities: Warm and well perfused, No edema  Skin: No suspicious lesions appreciated  Psych: Normal Affect  Ancillary Information   CBC    Component Value Date/Time   WBC 12.9 (H) 01/17/2023 0437   RBC 5.22 01/17/2023 0437   HGB 16.3 01/17/2023 0437   HGB 15.9 07/30/2019 0956   HCT 47.3 01/17/2023 0437   HCT 46.7 07/30/2019 0956   PLT 196 01/17/2023 0437   PLT 271 07/30/2019 0956   MCV 90.6 01/17/2023 0437   MCV 94 07/30/2019 0956   MCV 92 09/11/2013 0406   MCH 31.2 01/17/2023 0437   MCHC 34.5 01/17/2023 0437   RDW 13.1 01/17/2023 0437   RDW 13.3 07/30/2019 0956   RDW 12.8 09/11/2013 0406   LYMPHSABS 3.0 07/30/2019 0956   LYMPHSABS 3.3 09/11/2013 0406   MONOABS 0.7 07/27/2019 1630   MONOABS 0.4 09/11/2013 0406   EOSABS 0.3 07/30/2019 0956   EOSABS 0.1 09/11/2013 0406   BASOSABS 0.1 07/30/2019 0956   BASOSABS 0.0 09/11/2013 0406       Latest Ref Rng & Units 03/27/2023    8:36 AM  PFT Results  FVC-Pre L 2.80   FVC-Predicted Pre % 43   FVC-Post L 2.67  FVC-Predicted Post % 41   Pre FEV1/FVC % % 71   Post FEV1/FCV % % 76   FEV1-Pre L 1.98   FEV1-Predicted Pre % 39   FEV1-Post L 2.04   DLCO uncorrected ml/min/mmHg 17.84   DLCO UNC% % 48   DLVA Predicted % 86   TLC L 5.67   TLC % Predicted % 67   RV % Predicted % 125      Assessment & Plan:  Jeffrey Lin is a 48 year old male patient with a past medical history of heavy tobacco use, schizoaffective disorder who is presenting today to the pulmonary clinic for follow up on COPD Stage III B.    #RLL Pneumonia resolved #COPD Stage III B  #Subacute/chronic hypoxic respiratory failure    S/p 4 days Vanc - zosyn  and azithromycin  and 2 days doxycycline  finished on 01/19/2023.   Continues to improve, Oxygen sat at 93% off oxygen sustained at 91% on  exertion.    []  CT chest wo contrast in 8 weeks --> resolved consolidation - bibasilar atelectasis.  []  On stiolto samples, provided Spiriva  samples until he receives his Nebulizer machine and starts Brovana  which was the only thing financially appropriate.  []  refer to pulmonary rehab.    #Oxygen assessment SATURATION QUALIFICATIONS: (This note is used to comply with regulatory documentation for home oxygen)  Patient Saturations on Room Air at Rest = 89%  Patient Saturations on Room Air while Ambulating = 85%  Patient Saturations on 2 Liters of oxygen while Ambulating = 93%   #Tobacco use  Continues to smoke 1/2 ppd for 30 years. Discussed for 5 minutes, strategies for smoking cessation and provided NRT.   No follow-ups on file.  I spent 30 minutes caring for this patient today, including preparing to see the patient, obtaining a medical history , reviewing a separately obtained history, performing a medically appropriate examination and/or evaluation, counseling and educating the patient/family/caregiver, ordering medications, tests, or procedures, documenting clinical information in the electronic health record, and independently interpreting results (not separately reported/billed) and communicating results to the patient/family/caregiver  Darrin Barn, MD [facility] 07/30/2023 8:25 AM

## 2023-08-04 ENCOUNTER — Encounter: Payer: Self-pay | Admitting: Pulmonary Disease

## 2023-08-05 ENCOUNTER — Telehealth: Payer: Self-pay | Admitting: Pulmonary Disease

## 2023-08-05 ENCOUNTER — Other Ambulatory Visit
Admission: RE | Admit: 2023-08-05 | Discharge: 2023-08-05 | Disposition: A | Discharge status: Home / Self Care | Source: Ambulatory Visit | Attending: Pulmonary Disease | Admitting: Pulmonary Disease

## 2023-08-05 DIAGNOSIS — R0602 Shortness of breath: Secondary | ICD-10-CM | POA: Diagnosis not present

## 2023-08-05 DIAGNOSIS — J181 Lobar pneumonia, unspecified organism: Secondary | ICD-10-CM

## 2023-08-05 MED ORDER — DOXYCYCLINE HYCLATE 100 MG PO TABS: 100.0000 mg | ORAL_TABLET | Freq: Two times a day (BID) | ORAL | 0 refills | Status: DC

## 2023-08-05 NOTE — Telephone Encounter (Signed)
 Sputum gram stain with few gram + Cocci.  Will prescribe Doxycycline  100mg  1 tab BID for 7 days.   Darrin Barn, MD  Pulmonary Critical Care 08/05/2023 4:06 PM

## 2023-08-06 LAB — EXPECTORATED SPUTUM ASSESSMENT W GRAM STAIN, RFLX TO RESP C

## 2023-08-07 LAB — CULTURE, RESPIRATORY W GRAM STAIN: Culture: NORMAL

## 2023-08-08 ENCOUNTER — Ambulatory Visit

## 2023-08-08 MED ORDER — LIDOCAINE 5 % EX PTCH
1.0000 | MEDICATED_PATCH | CUTANEOUS | 0 refills | Status: DC
Start: 2023-08-08 — End: 2023-08-21

## 2023-08-08 MED ORDER — TRAMADOL HCL 50 MG PO TABS: 50.0000 mg | ORAL_TABLET | Freq: Two times a day (BID) | ORAL | 0 refills | Status: AC | PRN

## 2023-08-08 MED ORDER — TRAMADOL HCL 50 MG PO TABS: 50.0000 mg | ORAL_TABLET | Freq: Two times a day (BID) | ORAL | 0 refills | Status: DC | PRN

## 2023-08-08 NOTE — Telephone Encounter (Signed)
 Spoke with [place]. He was diagnosed with PNA on Monday.   He is currently on Doxycycline  and using 3 different inhalers.  He states he is hurting in his rib cage/sternum area from coughing.  I offered him cough syrup which is declined because he said it doesn't help.  I also ask how much tylenol /ibuprofen  is he taking and he replied he is not taking any because it doesn't do anything.  He said he tried to make an appointment today but they transferred him to triage and was told there were no available appointments.  He states he felt rushed off the phone.  He states it's okay if we can't do anything for him.  Please advise.

## 2023-08-08 NOTE — Telephone Encounter (Signed)
Jeffrey Lin notified as instructed by telephone.  Patient states understanding.  

## 2023-08-08 NOTE — Telephone Encounter (Signed)
 Let him know that I will send in a prescription for lidocaine  patches and tramadol  to use for chest wall pain. Make sure he is taking deep breaths and have him follow-up with a provider in the office next week if he is not improving as expected.

## 2023-08-08 NOTE — Addendum Note (Signed)
 Addended by: AVELINA NO E on: 08/08/2023 04:55 PM   Modules accepted: Orders

## 2023-08-08 NOTE — Telephone Encounter (Signed)
 FYI Only or Action Required?: Action required by provider: medication refill request.  Patient was last seen in primary care on 01/10/2023 by Avelina Greig BRAVO, MD.  Called Nurse Triage reporting Chest Pain.  Symptoms began a week ago.  Interventions attempted: Prescription medications: ABX and Rest, hydration, or home remedies.  Symptoms are: unchanged.  Triage Disposition: See PCP When Office is Open (Within 3 Days)  Patient/caregiver understands and will follow disposition?: Yes    Copied from CRM [id number]. Topic: Clinical - Red Word Triage >> Aug 08, 2023  7:38 AM Donna BRAVO wrote: Red Word that prompted transfer to Nurse Triage: patient pain in chest and sides, COPD crackly cough, pulmonary provider stated to call pcp to get medication Reason for Disposition  [1] Chest pain(s) lasting a few seconds AND [2] persists > 3 days  Answer Assessment - Initial Assessment Questions 1. LOCATION: Where does it hurt?       Chest and sides due to cough 2. RADIATION: Does the pain go anywhere else? (e.g., into neck, jaw, arms, back)     na 3. ONSET: When did the chest pain begin? (Minutes, hours or days)      X few weeks and worsening x 1 week 4. PATTERN: Does the pain come and go, or has it been constant since it started?  Does it get worse with exertion?      Comes and goes 5. DURATION: How long does it last (e.g., seconds, minutes, hours)     na 6. SEVERITY: How bad is the pain?  (e.g., Scale 1-10; mild, moderate, or severe)     Mild to moderate 7. CARDIAC RISK FACTORS: Do you have any history of heart problems or risk factors for heart disease? (e.g., angina, prior heart attack; diabetes, high blood pressure, high cholesterol, smoker, or strong family history of heart disease)     na 8. PULMONARY RISK FACTORS: Do you have any history of lung disease?  (e.g., blood clots in lung, asthma, emphysema, birth control pills)     na 9. CAUSE: What do you think is causing the  chest pain?     From coughing 10. OTHER SYMPTOMS: Do you have any other symptoms? (e.g., dizziness, nausea, vomiting, sweating, fever, difficulty breathing, cough)       na 11. PREGNANCY: Is there any chance you are pregnant? When was your last menstrual period?       na  patient pain in chest and sides is from coughing so much- diagnosed with pneumonia Monday. Pt c/o about chest & sides hurting due to coughing to Pulmonary doctor and he referred pt to call PCP with help regarding this matter.   Offered patient an appointment: no opening available at clinic for today therefore requested pain medication to help & if he needs to come in.  Patient stated took tylenol /ibuprofen  without relief.  Protocols used: Chest Pain-A-AH

## 2023-08-12 ENCOUNTER — Encounter: Payer: Self-pay | Admitting: Pulmonary Disease

## 2023-08-15 NOTE — Telephone Encounter (Signed)
 I tried calling pt and no answer and no v/m for pt; I also sent my chart note to pt per Dr Avelina instruction. Sending note to [facility] triage.

## 2023-08-18 NOTE — Telephone Encounter (Signed)
 Unable to reach pt by phone;no answer and no v/m. Unable to reach Jeffrey Lin(DPR signed)  Have already sent my chart note back to pt. Sending to [facility] and [facility]  triage pool.

## 2023-08-20 NOTE — Telephone Encounter (Signed)
 Noted

## 2023-08-21 ENCOUNTER — Ambulatory Visit: Admitting: Family Medicine

## 2023-08-21 ENCOUNTER — Encounter: Payer: Self-pay | Admitting: Family Medicine

## 2023-08-21 VITALS — BP 120/78 | HR 115 | Temp 98.2°F | Ht 76.0 in | Wt 265.1 lb

## 2023-08-21 DIAGNOSIS — G6289 Other specified polyneuropathies: Secondary | ICD-10-CM

## 2023-08-21 DIAGNOSIS — G8929 Other chronic pain: Secondary | ICD-10-CM | POA: Diagnosis not present

## 2023-08-21 DIAGNOSIS — M545 Low back pain, unspecified: Secondary | ICD-10-CM

## 2023-08-21 DIAGNOSIS — R0902 Hypoxemia: Secondary | ICD-10-CM

## 2023-08-21 DIAGNOSIS — J9611 Chronic respiratory failure with hypoxia: Secondary | ICD-10-CM

## 2023-08-21 DIAGNOSIS — M79604 Pain in right leg: Secondary | ICD-10-CM

## 2023-08-21 DIAGNOSIS — M79605 Pain in left leg: Secondary | ICD-10-CM

## 2023-08-21 MED ORDER — MELOXICAM 15 MG PO TABS: 15.0000 mg | ORAL_TABLET | Freq: Every day | ORAL | 0 refills | Status: DC

## 2023-08-21 MED ORDER — LIDOCAINE 5 % EX PTCH: 1.0000 | MEDICATED_PATCH | CUTANEOUS | 0 refills | Status: AC

## 2023-08-21 NOTE — Patient Instructions (Signed)
 Use lidocaine  patch on side.  Start meloxicam  daily We will move forward with nerve test and blood flow test.

## 2023-08-21 NOTE — Progress Notes (Unsigned)
 Patient ID: Jeffrey Lin, male    DOB: [birth year], 48 y.o.   MRN: [id number]  This visit was conducted in person.  BP 120/78   Pulse (!) 115   Temp 98.2 F (36.8 C) (Temporal)   Ht 6' 4 (1.93 m)   Wt 265 lb 2 oz (120.3 kg)   SpO2 (!) 86%   BMI 32.27 kg/m    CC:  Chief Complaint  Patient presents with  . Pain Management    Subjective:   HPI: Jeffrey Lin  is a 48 y.o. male presenting on 08/21/2023 for Pain Management   COPD followed by pulmonary: recent PFTs show stage III COPD 08/05/2023 gram + cocci in sputum.Jeffrey Lin given  doxycycline  100 mf BID x 7 days   Reviewed most recent OV note from 07/30/2023 HR 110 O2 Sat at rest 90% Currently on  Brovan + budesonide  BID and Yupelri  daily Referred to pulmonary rehab  Per note pt supposed to be on 2 L per [place] with exertion. ( He did not wear oxygen to office today, usually wears with oxygen.. has that at home) O2 sats at that visit  85% with ambulation    No change change in sputum., minimal cough, no fever... stable SOB in office today.   Chronic pain in bilateral legs from chronic back pain and neuropathy  Also having pain in right lung from where had collapsed lung  earlier in the year.   On gabapentin  1200 mg 3 times daily More recently followed by podiatry for plantar fasciitis.  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  He is on several psychiatric medications including  alprazolam  1 mg 4 times daily  Not better with holding statin  He did not have a good experience with pain clinic.Jeffrey Lin told could not give narcotics unless he cuts back on Xanax  and other option was Lyrica . NSAIDs and prednisone  contraindicated given eye issues per eye MD .   He has a history of opoid overdose per ER visit but per patient  Describes leg pain as constant ache  No change in leg  pain with exertion... constant.    Relevant past  medical, surgical, family and social history reviewed and updated as indicated. Interim medical history since our last visit reviewed. Allergies and medications reviewed and updated. Outpatient Medications Prior to Visit  Medication Sig Dispense Refill  . albuterol  (VENTOLIN  HFA) 108 (90 Base) MCG/ACT inhaler Inhale 2 puffs into the lungs every 6 (six) hours as needed for wheezing or shortness of breath. 8 g 2  . ALPRAZolam  (XANAX ) 1 MG tablet Take 1 mg by mouth 4 (four) times daily as needed.    . amantadine  (SYMMETREL ) 100 MG capsule Take 100 mg by mouth 2 (two) times daily.    . amphetamine -dextroamphetamine  (ADDERALL) 20 MG tablet Take 20 mg by mouth in the morning, at noon, and at bedtime.    . arformoterol  (BROVANA ) 15 MCG/2ML NEBU Take 2 mLs (15 mcg total) by nebulization in the morning and at bedtime. 120 mL 3  . aspirin  EC 81 MG tablet Take 1 tablet (81 mg total) by mouth daily. Swallow whole. 30 tablet 12  . budesonide  (PULMICORT ) 0.25 MG/2ML nebulizer solution Take 2 mLs (0.25 mg total) by nebulization 2 (two) times daily. 60 mL 12  . carvedilol  (COREG ) 25 MG tablet TAKE 1 TABLET (25 MG TOTAL) BY MOUTH  TWICE A DAY WITH MEALS 180 tablet 3  . FIBER PO Take by mouth.    . fluticasone  (FLONASE ) 50 MCG/ACT nasal spray Place 1 spray into both nostrils daily. 100 mL 2  . gabapentin  (NEURONTIN ) 600 MG tablet Take 1,200 mg by mouth 3 (three) times daily.     . nicotine  (NICODERM CQ  - DOSED IN MG/24 HOURS) 14 mg/24hr patch PLACE 1 PATCH ONTO THE SKIN DAILY FOR 14 DAYS. 14 patch 0  . revefenacin  (YUPELRI ) 175 MCG/3ML nebulizer solution Take 3 mLs (175 mcg total) by nebulization daily. 84 mL 0  . risperiDONE  (RISPERDAL ) 2 MG tablet Take 2 mg by mouth at bedtime.    . Tiotropium Bromide Monohydrate  (SPIRIVA  RESPIMAT) 2.5 MCG/ACT AERS Inhale 2 puffs into the lungs daily.    . Tiotropium Bromide-Olodaterol (STIOLTO RESPIMAT ) 2.5-2.5 MCG/ACT AERS Inhale 2 puffs into the lungs daily.    . tiZANidine   (ZANAFLEX ) 2 MG tablet TAKE 1 TABLET (2 MG TOTAL) BY MOUTH 3 (THREE) TIMES DAILY AS NEEDED FOR MUSCLE SPASMS. 270 tablet 0  . lidocaine  (LIDODERM ) 5 % Place 1 patch onto the skin daily. Remove & Discard patch within 12 hours or as directed by MD 9 patch 0  . atorvastatin  (LIPITOR) 10 MG tablet TAKE 1 TABLET BY MOUTH EVERY DAY (Patient not taking: Reported on 08/21/2023) 90 tablet 1  . Cholecalciferol (VITAMIN D3) 1.25 MG (50000 UT) CAPS TAKE 1 CAPSULE BY MOUTH ONE TIME PER WEEK (Patient not taking: Reported on 07/30/2023) 12 capsule 0  . doxycycline  (VIBRA -TABS) 100 MG tablet Take 1 tablet (100 mg total) by mouth 2 (two) times daily. 14 tablet 0  . YUPELRI  175 MCG/3ML nebulizer solution Take 3 mLs (175 mcg total) by nebulization daily. 90 mL 12   No facility-administered medications prior to visit.     Per HPI unless specifically indicated in ROS section below Review of Systems  Constitutional:  Negative for fatigue and fever.  HENT:  Negative for ear pain.   Eyes:  Negative for pain.  Respiratory:  Negative for cough and shortness of breath.   Cardiovascular:  Negative for chest pain, palpitations and leg swelling.  Gastrointestinal:  Negative for abdominal pain.  Genitourinary:  Negative for dysuria.  Musculoskeletal:  Positive for arthralgias, back pain and myalgias.  Neurological:  Negative for syncope, light-headedness and headaches.  Psychiatric/Behavioral:  Negative for dysphoric mood.    Objective:  BP 120/78   Pulse (!) 115   Temp 98.2 F (36.8 C) (Temporal)   Ht 6' 4 (1.93 m)   Wt 265 lb 2 oz (120.3 kg)   SpO2 (!) 86%   BMI 32.27 kg/m   Wt Readings from Last 3 Encounters:  08/21/23 265 lb 2 oz (120.3 kg)  07/30/23 264 lb 9.6 oz (120 kg)  05/02/23 255 lb 6.4 oz (115.8 kg)      Physical Exam Vitals reviewed.  Constitutional:      Appearance: He is well-developed.  HENT:     Head: Normocephalic.     Right Ear: Hearing normal.     Left Ear: Hearing normal.     Nose:  Nose normal.  Neck:     Thyroid : No thyroid  mass or thyromegaly.     Vascular: No carotid bruit.     Trachea: Trachea normal.  Cardiovascular:     Rate and Rhythm: Normal rate and regular rhythm.     Pulses: Normal pulses.     Heart sounds: Heart sounds not distant. No murmur heard.  No friction rub. No gallop.     Comments: No peripheral edema Pulmonary:     Effort: Pulmonary effort is normal. No respiratory distress.     Breath sounds: Normal breath sounds.  Musculoskeletal:     Lumbar back: No tenderness or bony tenderness. Decreased range of motion. Negative right straight leg raise test and negative left straight leg raise test.     Right foot: Normal range of motion. Tenderness and bony tenderness present.     Left foot: Normal range of motion. Tenderness and bony tenderness present.     Comments: Tender to palpation over anterior shins, prominent vessel in right anterior lower leg No peripheral swelling.  Skin:    General: Skin is warm and dry.     Findings: No lesion or rash.  Psychiatric:        Speech: Speech normal.        Behavior: Behavior normal.        Thought Content: Thought content normal.       Results for orders placed or performed during the hospital encounter of 08/05/23  Expectorated Sputum Assessment w Gram Stain, Rflx to Resp Cult   Collection Time: 08/05/23  7:45 AM   Specimen: Expectorated Sputum  Result Value Ref Range   Specimen Description EXPECTORATED SPUTUM    Special Requests NONE    Sputum evaluation      THIS SPECIMEN IS ACCEPTABLE FOR SPUTUM CULTURE Performed at [facility], [address] [place], [place] [address]    Report Status 08/06/2023 FINAL   Culture, Respiratory w Gram Stain   Collection Time: 08/05/23  7:45 AM  Result Value Ref Range   Specimen Description      EXPECTORATED SPUTUM Performed at [facility], [address] [place], [place] [address]    Special Requests      NONE Reflexed from  [phone] Performed at [facility], [address] [place], [place] [address]    Gram Stain      ABUNDANT WBC PRESENT, PREDOMINANTLY PMN RARE GRAM POSITIVE COCCI    Culture      MODERATE Normal respiratory flora-no Staph aureus or Pseudomonas seen Performed at [facility], [address], [place], [place] [address]    Report Status 08/07/2023 FINAL     Assessment and Plan  Chronic low back pain without sciatica, unspecified back pain laterality  Bilateral leg pain Assessment & Plan: Chronic, unclear cause of bilateral leg pain.  Possibly multifactorial.  Diagnosed in the past with peripheral neuropathy and chronic low back pain.  Will check ABIs and nerve conduction to try to determine source of bilateral leg pain.  May need reassessment of lumbar spine with MRI. He feels pain started primarily in left leg following surgical removal of hematoma.  He has tried multiple medications for chronic pain without success.  He is on max dose gabapentin .  Narcotics are contraindicated given he is on the high dose alprazolam  1 mg 4 times daily and is unable to decrease given psychiatric issues. We discussed avoidance of narcotic and detail today. Will try meloxicam  15 mg daily for retrial of anti-inflammatory. Encouraged him to use lidocaine  patch for right-sided chest pain that he associates with past collapsed lung. Unfortunately pain management referral was unhelpful and we could consider having him see a different pain doctor not for narcotics but for alternative therapies for chronic pain. We are limited with psychiatric medications for chronic pain given he is already being treated for schizoaffective  disorder.  Orders: -     VAS US  ABI WITH/WO TBI; Future -     Ambulatory referral to Neurology  Other polyneuropathy -     Ambulatory referral to Neurology  Hypoxia  Chronic hypoxic respiratory failure (HCC) Assessment & Plan: Chronic, due to severe COPD. Reviewed  recent pulmonary note and vitals are stable from that visit.  He is supposed to be using 2 L nasal cannula when exerting but he did not wear his oxygen to office today.  Encouraged him to wear the oxygen with any exertion especially outside the home.  He denies any signs and symptoms of acute infection or worsening breathing.   Other orders -     Meloxicam ; Take 1 tablet (15 mg total) by mouth daily.  Dispense: 30 tablet; Refill: 0 -     Lidocaine ; Place 1 patch onto the skin daily. Remove & Discard patch within 12 hours or as directed by MD  Dispense: 3 patch; Refill: 0    No follow-ups on file.   Greig Ring, MD

## 2023-08-21 NOTE — Telephone Encounter (Signed)
 Unable to reach pt and Cy (DPR signed) by phone and left v/m requesting Cy to call [phone] to get update on pt and to see if pt needs appt with Dr Avelina.

## 2023-08-22 ENCOUNTER — Encounter: Payer: Self-pay | Admitting: Family Medicine

## 2023-08-22 DIAGNOSIS — R0902 Hypoxemia: Secondary | ICD-10-CM | POA: Insufficient documentation

## 2023-08-22 NOTE — Telephone Encounter (Signed)
 I spoke with pt and pt said he was seen by Dr Avelina on 08/21/23. Pt is taking meloxicam  and pt said he feels slightly better this morning. Pt  will cb to [facility] if needed.

## 2023-08-22 NOTE — Assessment & Plan Note (Signed)
 Chronic, due to severe COPD. Reviewed recent pulmonary note and vitals are stable from that visit.  He is supposed to be using 2 L nasal cannula when exerting but he did not wear his oxygen to office today.  Encouraged him to wear the oxygen with any exertion especially outside the home.  He denies any signs and symptoms of acute infection or worsening breathing.

## 2023-08-22 NOTE — Assessment & Plan Note (Signed)
 Chronic, unclear cause of bilateral leg pain.  Possibly multifactorial.  Diagnosed in the past with peripheral neuropathy and chronic low back pain.  Will check ABIs and nerve conduction to try to determine source of bilateral leg pain.  May need reassessment of lumbar spine with MRI. He feels pain started primarily in left leg following surgical removal of hematoma.  He has tried multiple medications for chronic pain without success.  He is on max dose gabapentin .  Narcotics are contraindicated given he is on the high dose alprazolam  1 mg 4 times daily and is unable to decrease given psychiatric issues. We discussed avoidance of narcotic and detail today. Will try meloxicam  15 mg daily for retrial of anti-inflammatory. Encouraged him to use lidocaine  patch for right-sided chest pain that he associates with past collapsed lung. Unfortunately pain management referral was unhelpful and we could consider having him see a different pain doctor not for narcotics but for alternative therapies for chronic pain. We are limited with psychiatric medications for chronic pain given he is already being treated for schizoaffective disorder.

## 2023-08-24 ENCOUNTER — Encounter: Payer: Self-pay | Admitting: Pulmonary Disease

## 2023-08-24 DIAGNOSIS — R058 Other specified cough: Secondary | ICD-10-CM

## 2023-08-26 MED ORDER — YUPELRI 175 MCG/3ML IN SOLN: 175.0000 ug | Freq: Every day | RESPIRATORY_TRACT | Status: DC

## 2023-08-26 NOTE — Telephone Encounter (Signed)
 I placed an order for sputum C&S.

## 2023-08-26 NOTE — Telephone Encounter (Signed)
 Patient is calling back concerning the message he sent through [facility] concerning a specimen cup and samples. Patient didn't see message and he is saying that he do need the specimen cup and samples . So I can catch it and put it in there to bring up there to be tested . Patient does not want to be in the hospital . Have a appointment tomorrow at the vein place and he can stop by before or after the vein appointment to pick up samples and speciman cup . Please rreach out to patient with more information and he didn't see the messge in mychart so please give patient a call . [phone]

## 2023-08-26 NOTE — Telephone Encounter (Signed)
 Dr. [name] is out of the office. Dr. Tamea can you advise?

## 2023-08-26 NOTE — Telephone Encounter (Signed)
 He had sputum collected on 15 July that did not show any significant pathogens.  I have reviewed his chart and currently cannot determine if any specific specimen is necessary.  I have also reviewed his recent imaging and that does not appear to show active infection.  Would recommend follow-up with Dr. [name] when he comes back.

## 2023-08-26 NOTE — Addendum Note (Signed)
 Addended by: VICCI EVALENE DEL on: 08/26/2023 04:33 PM   Modules accepted: Orders

## 2023-08-27 ENCOUNTER — Ambulatory Visit (INDEPENDENT_AMBULATORY_CARE_PROVIDER_SITE_OTHER)

## 2023-08-27 ENCOUNTER — Encounter: Payer: Self-pay | Admitting: Family Medicine

## 2023-08-27 DIAGNOSIS — M79605 Pain in left leg: Secondary | ICD-10-CM

## 2023-08-27 DIAGNOSIS — M79604 Pain in right leg: Secondary | ICD-10-CM | POA: Diagnosis not present

## 2023-08-29 ENCOUNTER — Ambulatory Visit: Payer: Self-pay | Admitting: Family Medicine

## 2023-08-29 LAB — VAS US ABI WITH/WO TBI
Left ABI: 1.21
Right ABI: 1.12

## 2023-09-01 ENCOUNTER — Encounter: Payer: Self-pay | Admitting: Neurology

## 2023-09-12 ENCOUNTER — Encounter: Payer: Self-pay | Admitting: Family Medicine

## 2023-09-12 DIAGNOSIS — H524 Presbyopia: Secondary | ICD-10-CM | POA: Diagnosis not present

## 2023-09-12 DIAGNOSIS — E11319 Type 2 diabetes mellitus with unspecified diabetic retinopathy without macular edema: Secondary | ICD-10-CM | POA: Diagnosis not present

## 2023-09-12 DIAGNOSIS — J4489 Other specified chronic obstructive pulmonary disease: Secondary | ICD-10-CM | POA: Insufficient documentation

## 2023-09-12 DIAGNOSIS — H269 Unspecified cataract: Secondary | ICD-10-CM | POA: Diagnosis not present

## 2023-09-17 ENCOUNTER — Other Ambulatory Visit: Payer: Self-pay | Admitting: Family Medicine

## 2023-09-17 NOTE — Telephone Encounter (Signed)
 Last office visit 08/21/2023 for pain management.  Last refilled 08/21/2023 for #30 with no refills.  Next Appt: No future appointments with PCP.

## 2023-10-23 ENCOUNTER — Other Ambulatory Visit: Payer: Self-pay

## 2023-10-23 ENCOUNTER — Ambulatory Visit: Admitting: Neurology

## 2023-10-23 DIAGNOSIS — R202 Paresthesia of skin: Secondary | ICD-10-CM

## 2023-10-23 DIAGNOSIS — G629 Polyneuropathy, unspecified: Secondary | ICD-10-CM

## 2023-10-23 NOTE — Procedures (Signed)
 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  NCV & EMG Findings: Extensive electrodiagnostic testing of the right lower extremity and additional studies of the left shows:  Bilateral sural and superficial peroneal sensory responses are absent. Right peroneal and bilateral tibial motor responses show reduced amplitude (R1.1, R5.2, L7.7 mV).  Left peroneal (EDB) and bilateral peroneal motor responses at the tibialis anterior are within normal limits. Bilateral tibial H reflex study shows prolonged latency. Chronic motor axon loss changes are seen affecting bilateral flexor digitorum longus muscles, without accompanying active denervation.  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  Compared to prior study on 02/06/2017, demyelinating changes are no longer present.   ___________________________ Tonita Blanch, DO    Nerve Conduction Studies   Stim Site NR Peak (ms) Norm Peak (ms) O-P Amp (V) Norm O-P Amp  Left Sup Peroneal Anti Sensory (Ant Lat Mall)  32 C  12 cm *NR  <4.5  >5  Right Sup Peroneal Anti Sensory (Ant Lat Mall)  32 C  12 cm *NR  <4.5  >5  Left Sural Anti Sensory (Lat Mall)  32 C  Calf *NR  <4.5  >5  Right Sural Anti Sensory (Lat Mall)  32 C  Calf *NR  <4.5  >5     Stim Site NR Onset (ms) Norm Onset (ms) O-P Amp (mV) Norm O-P Amp Site1 Site2 Delta-0 (ms) Dist (cm) Vel (m/s) Norm Vel (m/s)  Left Peroneal Motor (Ext Dig Brev)  32 C  Ankle    3.9 <5.5 4.1 >3 B Fib Ankle 9.8 40.0 41 >40  B  Fib    13.7  2.3  Poplt B Fib 2.5 10.0 40 >40  Poplt    16.2  2.3         Right Peroneal Motor (Ext Dig Brev)  32 C  Ankle    3.4 <5.5 *1.1 >3 B Fib Ankle 10.4 46.0 44 >40  B Fib    13.8  0.9  Poplt B Fib 2.4 10.0 42 >40  Poplt    16.2  0.8         Left Peroneal TA Motor (Tib Ant)  32 C  Fib Head    2.4 <4.0 4.7 >4 Poplit Fib Head 1.8 10.0 56 >40  Poplit    4.2 <5.7 4.7         Right Peroneal TA Motor (Tib Ant)  32 C  Fib Head    2.8 <4.0 5.9 >4 Poplit Fib Head 1.6 10.0 62 >40  Poplit    4.4 <5.7 5.8         Left Tibial Motor (Abd Hall Brev)  32 C  Ankle    5.2 <6.0 *7.7 >8 Knee Ankle 10.2 46.0 45 >40  Knee    15.4  3.1         Right Tibial Motor (Abd Hall Brev)  32 C  Ankle    4.5 <6.0 *5.2 >8 Knee Ankle 12.7 46.0 40 >40  Knee    17.2  2.5  Electromyography   Side Muscle Ins.Act Fibs Fasc Recrt Amp Dur Poly Activation Comment  Right AntTibialis Nml Nml Nml Nml Nml Nml Nml Nml N/A  Right Gastroc Nml Nml Nml Nml Nml Nml Nml Nml N/A  Right Flex Dig Long Nml Nml Nml *2- *1+ *1+ *1+ Nml N/A  Right RectFemoris Nml Nml Nml Nml Nml Nml Nml Nml N/A  Right GluteusMed Nml Nml Nml Nml Nml Nml Nml Nml N/A  Left AntTibialis Nml Nml Nml Nml Nml Nml Nml Nml N/A  Left Gastroc Nml Nml Nml Nml Nml Nml Nml Nml N/A  Left Flex Dig Long Nml Nml Nml *2- *1+ *1+ *1+ Nml N/A  Left RectFemoris Nml Nml Nml Nml Nml Nml Nml Nml N/A  Left GluteusMed Nml Nml Nml Nml Nml Nml Nml Nml N/A      Waveforms:

## 2023-11-10 ENCOUNTER — Ambulatory Visit: Payer: Medicare Other

## 2023-11-10 VITALS — BP 120/78 | Ht 76.0 in | Wt 270.0 lb

## 2023-11-10 DIAGNOSIS — Z0000 Encounter for general adult medical examination without abnormal findings: Secondary | ICD-10-CM | POA: Diagnosis not present

## 2023-11-10 NOTE — Patient Instructions (Signed)
 Jeffrey Lin,  Thank you for taking the time for your Medicare Wellness Visit. I appreciate your continued commitment to your health goals. Please review the care plan we discussed, and feel free to reach out if I can assist you further.  Medicare recommends these wellness visits once per year to help you and your care team stay ahead of potential health issues. These visits are designed to focus on prevention, allowing your provider to concentrate on managing your acute and chronic conditions during your regular appointments.  Please note that Annual Wellness Visits do not include a physical exam. Some assessments may be limited, especially if the visit was conducted virtually. If needed, we may recommend a separate in-person follow-up with your provider.  Ongoing Care Seeing your primary care provider every 3 to 6 months helps us  monitor your health and provide consistent, personalized care.   Referrals If a referral was made during today's visit and you haven't received any updates within two weeks, please contact the referred provider directly to check on the status.  Recommended Screenings:  Health Maintenance  Topic Date Due   Pneumococcal Vaccine (1 of 2 - PCV) Never done   Hepatitis B Vaccine (1 of 3 - 19+ 3-dose series) Never done   DTaP/Tdap/Td vaccine (2 - Tdap) 06/09/2019   Colon Cancer Screening  Never done   Medicare Annual Wellness Visit  11/07/2023   Hepatitis C Screening  Completed   HIV Screening  Completed   HPV Vaccine  Aged Out   Meningitis B Vaccine  Aged Out   Flu Shot  Discontinued   COVID-19 Vaccine  Discontinued       11/10/2023   12:02 PM  Advanced Directives  Does Patient Have a Medical Advance Directive? No  Would patient like information on creating a medical advance directive? No - Patient declined   Advance Care Planning is important because it: Ensures you receive medical care that aligns with your values, goals, and preferences. Provides  guidance to your family and loved ones, reducing the emotional burden of decision-making during critical moments.  Vision: Annual vision screenings are recommended for early detection of glaucoma, cataracts, and diabetic retinopathy. These exams can also reveal signs of chronic conditions such as diabetes and high blood pressure.  Dental: Annual dental screenings help detect early signs of oral cancer, gum disease, and other conditions linked to overall health, including heart disease and diabetes.  Please see the attached documents for additional preventive care recommendations.

## 2023-11-10 NOTE — Progress Notes (Signed)
 Because this visit was a virtual/telehealth visit,  certain criteria was not obtained, such a blood pressure, CBG if applicable, and timed get up and go. Any medications not marked as taking were not mentioned during the medication reconciliation part of the visit. Any vitals not documented were not able to be obtained due to this being a telehealth visit or patient was unable to self-report a recent blood pressure reading due to a lack of equipment at home via telehealth. Vitals that have been documented are verbally provided by the patient.   This visit was performed by a medical professional under my direct supervision. I was immediately available for consultation/collaboration. I have reviewed and agree with the Annual Wellness Visit documentation.  Subjective:   Jeffrey Lin is a 48 y.o. who presents for a Medicare Wellness preventive visit.  As a reminder, Annual Wellness Visits don't include a physical exam, and some assessments may be limited, especially if this visit is performed virtually. We may recommend an in-person follow-up visit with your provider if needed.  Visit Complete: Virtual I connected with  Jeffrey Lin on 11/10/23 by a audio enabled telemedicine application and verified that I am speaking with the correct person using two identifiers.  Patient Location: Home  Provider Location: Home Office  I discussed the limitations of evaluation and management by telemedicine. The patient expressed understanding and agreed to proceed.  Vital Signs: Because this visit was a virtual/telehealth visit, some criteria may be missing or patient reported. Any vitals not documented were not able to be obtained and vitals that have been documented are patient reported.  VideoDeclined- This patient declined Librarian, academic. Therefore the visit was completed with audio only.  Persons Participating in Visit: Patient.  AWV Questionnaire: No: Patient  Medicare AWV questionnaire was not completed prior to this visit.  Cardiac Risk Factors include: advanced age (>65men, >11 women);male gender;hypertension;Other (see comment);dyslipidemia, Risk factor comments: copd     Objective:    Today's Vitals   11/10/23 1159  BP: 120/78  Weight: 270 lb (122.5 kg)  Height: 6' 4 (1.93 m)   Body mass index is 32.87 kg/m.     11/10/2023   12:02 PM 11/07/2022    1:10 PM 03/29/2022    9:30 AM 04/25/2021    3:26 PM 08/24/2020   11:37 AM 06/10/2019    9:49 PM 05/28/2019   12:43 PM  Advanced Directives  Does Patient Have a Medical Advance Directive? No No No No No No No  Would patient like information on creating a medical advance directive? No - Patient declined Yes (MAU/Ambulatory/Procedural Areas - Information given) No - Patient declined Yes (MAU/Ambulatory/Procedural Areas - Information given) No - Patient declined No - Patient declined     Current Medications (verified) Outpatient Encounter Medications as of 11/10/2023  Medication Sig   albuterol  (VENTOLIN  HFA) 108 (90 Base) MCG/ACT inhaler Inhale 2 puffs into the lungs every 6 (six) hours as needed for wheezing or shortness of breath.   ALPRAZolam  (XANAX ) 1 MG tablet Take 1 mg by mouth 4 (four) times daily as needed.   amantadine  (SYMMETREL ) 100 MG capsule Take 100 mg by mouth 2 (two) times daily.   amphetamine -dextroamphetamine  (ADDERALL) 20 MG tablet Take 20 mg by mouth in the morning, at noon, and at bedtime.   arformoterol  (BROVANA ) 15 MCG/2ML NEBU Take 2 mLs (15 mcg total) by nebulization in the morning and at bedtime.   aspirin  EC 81 MG tablet Take 1 tablet (81  mg total) by mouth daily. Swallow whole.   budesonide  (PULMICORT ) 0.25 MG/2ML nebulizer solution Take 2 mLs (0.25 mg total) by nebulization 2 (two) times daily.   carvedilol  (COREG ) 25 MG tablet TAKE 1 TABLET (25 MG TOTAL) BY MOUTH TWICE A DAY WITH MEALS   FIBER PO Take by mouth.   fluticasone  (FLONASE ) 50 MCG/ACT nasal spray Place  1 spray into both nostrils daily.   gabapentin  (NEURONTIN ) 600 MG tablet Take 1,200 mg by mouth 3 (three) times daily.    lidocaine  (LIDODERM ) 5 % Place 1 patch onto the skin daily. Remove & Discard patch within 12 hours or as directed by MD   meloxicam  (MOBIC ) 15 MG tablet TAKE 1 TABLET (15 MG TOTAL) BY MOUTH DAILY.   nicotine  (NICODERM CQ  - DOSED IN MG/24 HOURS) 14 mg/24hr patch PLACE 1 PATCH ONTO THE SKIN DAILY FOR 14 DAYS.   revefenacin  (YUPELRI ) 175 MCG/3ML nebulizer solution Take 3 mLs (175 mcg total) by nebulization daily.   revefenacin  (YUPELRI ) 175 MCG/3ML nebulizer solution Take 3 mLs (175 mcg total) by nebulization daily.   risperiDONE  (RISPERDAL ) 2 MG tablet Take 2 mg by mouth at bedtime.   Tiotropium Bromide Monohydrate  (SPIRIVA  RESPIMAT) 2.5 MCG/ACT AERS Inhale 2 puffs into the lungs daily.   Tiotropium Bromide-Olodaterol (STIOLTO RESPIMAT ) 2.5-2.5 MCG/ACT AERS Inhale 2 puffs into the lungs daily.   tiZANidine  (ZANAFLEX ) 2 MG tablet TAKE 1 TABLET (2 MG TOTAL) BY MOUTH 3 (THREE) TIMES DAILY AS NEEDED FOR MUSCLE SPASMS.   atorvastatin  (LIPITOR) 10 MG tablet TAKE 1 TABLET BY MOUTH EVERY DAY (Patient not taking: Reported on 11/10/2023)   Cholecalciferol (VITAMIN D3) 1.25 MG (50000 UT) CAPS TAKE 1 CAPSULE BY MOUTH ONE TIME PER WEEK (Patient not taking: Reported on 11/10/2023)   No facility-administered encounter medications on file as of 11/10/2023.    Allergies (verified) Toradol  [ketorolac  tromethamine ], Trelegy ellipta  [fluticasone -umeclidin-vilant], Wixela inhub [fluticasone -salmeterol], and Lamotrigine   History: Past Medical History:  Diagnosis Date   Anxiety    Chronic heart failure with preserved ejection fraction (HFpEF) (HCC)    a. 02/2016 Echo: EF 55-60%; b. 04/2019 Echo: EF 60-65%, no rwma, mod reduced RV fxn, mod dil RA; c. 07/2019 RHC: RA 2, RV 39/1, PA 39/15, PCWP 7, CO 9.1, CI 3.4.   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   RIGHT HEART CATH AND CORONARY ANGIOGRAPHY Right 08/02/2019   Procedure: RIGHT HEART CATH AND CORONARY ANGIOGRAPHY;  Surgeon: Darron Deatrice LABOR, MD;  Location: [facility];  Service: Cardiovascular;  Laterality: Right;   Sphenopalatine ganglionic     Family History  Problem Relation Age of Onset   Hyperlipidemia Mother    Hypertension Mother    Depression Mother    Anxiety disorder Mother    Lung disease Father    Depression Brother    Anxiety disorder Brother    Bipolar disorder Maternal Grandfather    Schizophrenia Maternal Grandfather    Bipolar disorder Paternal Grandfather    Social History   Socioeconomic History   Marital  status: Single    Spouse name: Not on file   Number of children: 0   Years of education: 14   Highest education level: Not on file  Occupational History   Occupation: Marketing executive    Employer: [employer]  Tobacco Use   Smoking status: Every Day    Current packs/day: 1.50    Average packs/day: 1.5 packs/day for 28.1 years (42.2 ttl pk-yrs)    Types: Cigarettes    Start date: 09/22/1995    Passive exposure: Past   Smokeless tobacco: Former   Tobacco comments:    Pt states that he is cutting back - currently 1- 1.5 ppd.  Smoking 1/2 ppd.  States 1 cigarette lasts a couple of hours.  He is trying to quit, however is not using anything to assist him.  04/23/2023  Vaping Use   Vaping status: Never Used  Substance and Sexual Activity   Alcohol use: No    Comment: previously drank heavily - none in several years   Drug use: No   Sexual activity: Not Currently  Other Topics Concern   Not on file  Social History  Narrative   Lives locally w/ mother.  Does not routinely exercise.   Exercise:  None, running causes head pain   Diet:  FF once daily, + fruits and veggies, + H2O, + Soda   Lives with mom in a one story home (has 5 steps).  No children.  Currently not working - disabled due to severe anxiety.  Education: associate degree.    Right handed   One story home   Social Drivers of Health   Financial Resource Strain: Low Risk  (11/10/2023)   Overall Financial Resource Strain (CARDIA)    Difficulty of Paying Living Expenses: Not hard at all  Food Insecurity: No Food Insecurity (11/10/2023)   Hunger Vital Sign    Worried About Running Out of Food in the Last Year: Never true    Ran Out of Food in the Last Year: Never true  Transportation Needs: No Transportation Needs (11/10/2023)   PRAPARE - Administrator, Civil Service (Medical): No    Lack of Transportation (Non-Medical): No  Physical Activity: Sufficiently Active (11/10/2023)   Exercise Vital Sign    Days of Exercise per Week: 5 days    Minutes of Exercise per Session: 30 min  Stress: No Stress Concern Present (11/10/2023)   [employer] of Occupational Health - Occupational Stress Questionnaire    Feeling of Stress: Not at all  Social Connections: Socially Isolated (11/10/2023)   Social Connection and Isolation Panel    Frequency of Communication with Friends and Family: More than three times a week    Frequency of Social Gatherings with Friends and Family: More than three times a week    Attends Religious Services: Never    Database administrator or Organizations: No    Attends Engineer, structural: Never    Marital Status: Never married    Tobacco Counseling Ready to quit: Not Answered Counseling given: Not Answered Tobacco comments: Pt states that he is cutting back - currently 1- 1.5 ppd.  Smoking 1/2 ppd.  States 1 cigarette lasts a couple of hours.  He is trying to quit, however is not using  anything to assist him.  04/23/2023    Clinical Intake:  Pre-visit preparation completed: Yes  Pain : No/denies pain     BMI - recorded: 32.87 Nutritional Status: BMI >  30  Obese Nutritional Risks: None Diabetes: No  Lab Results  Component Value Date   HGBA1C 6.5 11/05/2022   HGBA1C 6.1 11/02/2021   HGBA1C 6.1 08/12/2019     How often do you need to have someone help you when you read instructions, pamphlets, or other written materials from your doctor or pharmacy?: 1 - Never  Interpreter Needed?: No  Information entered by :: Harbour Nordmeyer,CMA   Activities of Daily Living     11/10/2023   12:01 PM  In your present state of health, do you have any difficulty performing the following activities:  Hearing? 0  Vision? 0  Difficulty concentrating or making decisions? 0  Walking or climbing stairs? 0  Dressing or bathing? 0  Doing errands, shopping? 0  Preparing Food and eating ? N  Using the Toilet? N  In the past six months, have you accidently leaked urine? N  Do you have problems with loss of bowel control? N  Managing your Medications? N  Managing your Finances? N  Housekeeping or managing your Housekeeping? N    Patient Care Team: Avelina Greig BRAVO, MD as PCP - General Darron Deatrice LABOR, MD as PCP - Cardiology (Cardiology) Patel, Donika K, DO as Consulting Physician (Neurology) [name] Domino, MD as Consulting Physician (Pulmonary Disease)  I have updated your Care Teams any recent Medical Services you may have received from other providers in the past year.     Assessment:   This is a routine wellness examination for [place].  Hearing/Vision screen Hearing Screening - Comments:: No difficulties  Vision Screening - Comments:: Patient declined    Goals Addressed             This Visit's Progress    Patient Stated       N/A       Depression Screen     11/10/2023   12:02 PM 08/21/2023    8:33 AM 12/05/2022    8:19 AM 11/07/2022     1:09 PM 07/04/2022   11:44 AM 05/28/2022   12:00 PM 04/25/2021    3:16 PM  PHQ 2/9 Scores  PHQ - 2 Score 0 1 2 0 0 0 1  PHQ- 9 Score 0 2 6   0     Fall Risk     11/10/2023   12:02 PM 08/21/2023    8:33 AM 03/20/2023    2:52 PM 12/05/2022    8:19 AM 11/07/2022    1:07 PM  Fall Risk   Falls in the past year? 0 0 0 0 0  Number falls in past yr: 0 0  0 0  Injury with Fall? 0 1  0 0  Risk for fall due to : No Fall Risks No Fall Risks  No Fall Risks No Fall Risks  Follow up Falls evaluation completed Falls evaluation completed  Falls evaluation completed Falls prevention discussed    MEDICARE RISK AT HOME:  Medicare Risk at Home Any stairs in or around the home?: No If so, are there any without handrails?: No Home free of loose throw rugs in walkways, pet beds, electrical cords, etc?: Yes Adequate lighting in your home to reduce risk of falls?: Yes Life alert?: No Use of a cane, walker or w/c?: No Grab bars in the bathroom?: No Shower chair or bench in shower?: Yes Elevated toilet seat or a handicapped toilet?: Yes  TIMED UP AND GO:  Was the test performed?  No  Cognitive Function: 6CIT completed  10/16/2015    8:57 AM 10/09/2015    9:19 AM 10/02/2015    8:55 AM  MMSE - Mini Mental State Exam  Orientation to time     Orientation to Place     Registration     Attention/ Calculation     Recall     Language- name 2 objects     Language- repeat     Language- follow 3 step command     Language- read & follow direction     Write a sentence     Copy design     Total score        Information is confidential and restricted. Go to Review Flowsheets to unlock data.        11/10/2023   12:00 PM 11/07/2022    1:10 PM  6CIT Screen  What Year? 0 points 0 points  What month? 0 points 0 points  What time? 0 points 0 points  Count back from 20 0 points 0 points  Months in reverse 0 points 0 points  Repeat phrase 0 points 0 points  Total Score 0 points 0 points     Immunizations Immunization History  Administered Date(s) Administered   Td 06/08/2009    Screening Tests Health Maintenance  Topic Date Due   Pneumococcal Vaccine (1 of 2 - PCV) Never done   Hepatitis B Vaccines 19-59 Average Risk (1 of 3 - 19+ 3-dose series) Never done   DTaP/Tdap/Td (2 - Tdap) 06/09/2019   Colonoscopy  Never done   Medicare Annual Wellness (AWV)  11/09/2024   Hepatitis C Screening  Completed   HIV Screening  Completed   HPV VACCINES  Aged Out   Meningococcal B Vaccine  Aged Out   Influenza Vaccine  Discontinued   COVID-19 Vaccine  Discontinued    Health Maintenance Items Addressed:patient declined   Additional Screening:  Vision Screening: Recommended annual ophthalmology exams for early detection of glaucoma and other disorders of the eye. Is the patient up to date with their annual eye exam?  No  Who is the provider or what is the name of the office in which the patient attends annual eye exams?   Dental Screening: Recommended annual dental exams for proper oral hygiene  Community Resource Referral / Chronic Care Management: CRR required this visit?  No   CCM required this visit?  No   Plan:    I have personally reviewed and noted the following in the patient's chart:   Medical and social history Use of alcohol, tobacco or illicit drugs  Current medications and supplements including opioid prescriptions. Patient is not currently taking opioid prescriptions. Functional ability and status Nutritional status Physical activity Advanced directives List of other physicians Hospitalizations, surgeries, and ER visits in previous 12 months Vitals Screenings to include cognitive, depression, and falls Referrals and appointments  In addition, I have reviewed and discussed with patient certain preventive protocols, quality metrics, and best practice recommendations. A written personalized care plan for preventive services as well as general  preventive health recommendations were provided to patient.   Lyle MARLA Right, [place]   11/10/2023   After Visit Summary: (MyChart) Due to this being a telephonic visit, the after visit summary with patients personalized plan was offered to patient via MyChart   Notes: Nothing significant to report at this time.

## 2023-11-12 ENCOUNTER — Other Ambulatory Visit: Payer: Self-pay | Admitting: Family Medicine

## 2023-11-12 ENCOUNTER — Telehealth: Payer: Self-pay | Admitting: *Deleted

## 2023-11-12 DIAGNOSIS — G608 Other hereditary and idiopathic neuropathies: Secondary | ICD-10-CM

## 2023-11-12 NOTE — Telephone Encounter (Signed)
 Copied from CRM [id number]. Topic: Clinical - Lab/Test Results >> Nov 12, 2023  8:20 AM Treva T wrote: Reason for CRM: Received call from patient to inquire on neurology test results, had test performed on 10/23/23, by Dr. Tonita Blanch, and was advised to follow up with PCP with results.   Per patient reports he has not heard back from results.  Patient is requesting a return call with results, can be reached at [phone], and patient is aware of same day call back.

## 2023-11-13 ENCOUNTER — Ambulatory Visit: Attending: Cardiology | Admitting: Cardiology

## 2023-11-13 ENCOUNTER — Encounter: Payer: Self-pay | Admitting: Cardiology

## 2023-11-13 VITALS — BP 115/80 | HR 122 | Ht 77.0 in | Wt 275.4 lb

## 2023-11-13 DIAGNOSIS — R000 Tachycardia, unspecified: Secondary | ICD-10-CM

## 2023-11-13 DIAGNOSIS — E782 Mixed hyperlipidemia: Secondary | ICD-10-CM

## 2023-11-13 DIAGNOSIS — F17200 Nicotine dependence, unspecified, uncomplicated: Secondary | ICD-10-CM

## 2023-11-13 DIAGNOSIS — J449 Chronic obstructive pulmonary disease, unspecified: Secondary | ICD-10-CM | POA: Diagnosis not present

## 2023-11-13 DIAGNOSIS — I10 Essential (primary) hypertension: Secondary | ICD-10-CM

## 2023-11-13 NOTE — Patient Instructions (Signed)
 Medication Instructions:  Your physician recommends that you continue on your current medications as directed. Please refer to the Current Medication list given to you today.   *If you need a refill on your cardiac medications before your next appointment, please call your pharmacy*  Lab Work: No labs ordered today  If you have labs (blood work) drawn today and your tests are completely normal, you will receive your results only by: MyChart Message (if you have MyChart) OR A paper copy in the mail If you have any lab test that is abnormal or we need to change your treatment, we will call you to review the results.  Testing/Procedures: No test ordered today   Follow-Up: At [facility], you and your health needs are our priority.  As part of our continuing mission to provide you with exceptional heart care, our providers are all part of one team.  This team includes your primary Cardiologist (physician) and Advanced Practice Providers or APPs (Physician Assistants and Nurse Practitioners) who all work together to provide you with the care you need, when you need it.  Your next appointment:   6 month(s)  Provider:   You may see Deatrice Cage, MD or one of the following Advanced Practice Providers on your designated Care Team:   Tylene Lunch, NP

## 2023-11-13 NOTE — Progress Notes (Signed)
 Cardiology Office Note   Date:  11/13/2023  ID:  Jeffrey Lin, DOB July 05, 1975, MRN [id number] PCP: Avelina Greig BRAVO, MD  [facility] Providers Cardiologist:  Deatrice Cage, MD Cardiology APP:  Gerard Frederick, NP     History of Present Illness Jeffrey Lin is a 48 y.o. male with past medical history of anxiety, depression, chronic pain, schizoaffective disorder, hypertension, obesity, who is here today for follow-up.   Previously was evaluated by cardiology in 07/2014 for same atypical chest pain.  Echocardiogram in 02/2016 showed normal LV function.  In 04/2018 was hospitalized with lower extremity cellulitis and swelling.  Repeat echo showed normal LVEF without significant valvular disease.  He established for outpatient care in 07/2019 and continues to have bilateral lower extremity edema, blisters, oozing, and stasis dermatitis.  He was maintained on oral torsemide  and lymphedema pumps were ordered.  Right heart catheterization in 07/2018 showed relatively normal filling pressures with mild PAH (39/15) and he was referred for sleep study.  He was admitted to [facility] 01/13/2023 and was seen by our group for evaluating high-sensitivity troponin in the setting of sepsis and aspiration pneumonia.  Echocardiogram was completed which showed low normal LV systolic function with an EF of 50-55% with no clear wall motion abnormalities but suboptimal imaging overall.  Elevated high-sensitivity troponin was thought to be supply/demand mismatch with no convincing evidence of acute coronary syndrome.  He was continued on low-dose aspirin .  Heparin  was discontinued.  Given his risk factors recommend outpatient ischemic cardiac evaluation for restratification after acute illness and improved.  He was last seen 3/17/425 by Dr.Agbor-Etang.  He denied any chest pain but continued to endorse shortness of breath was on chronic oxygen therapy.  He was scheduled for Lexiscan  Myoview  for continued evaluation after  elevated high-sensitivity troponins during his recent hospitalization.  There were no other medication changes that were made.    He was last seen in clinic 05/02/2023.  At that time he was accompanied by family member.  He continued to have shortness of breath that had improved since his pneumonia and resolved.  Unfortunately had a partially collapsed lung.  He was requiring 3 L of oxygen at night.  Continues to smoke approximately 1 pack/day.  Denied any hospitalizations or visits to the emergency department.  There were no medication changes that were made and no further testing that was ordered at that time.  He was recommended to return for follow-up in 6 months.  He returns to clinic today stating that he continues to have shortness of breath that is unchanged.  He has decreased the amount that he has been smoking daily.  He states that he has not tachycardic at this morning as he has been monitoring his heart rate with his watch from walking and from the parking lot he has not had his carvedilol  as his primary care provider has advised him to only take his carvedilol  on an as needed basis due to previous drop in his blood pressure.  He states he only takes his carvedilol  when his blood pressure is elevated.  Stated that he has been having worsening neuropathy pain and was advised to no longer take his cholesterol medication as well.  He states that he has not had any hospitalizations or visits to the emergency department.  States that he has recently been advised by his pulmonologist to increase his oxygen by 1 L to 4 L via nasal cannula.  ROS: 10 point review of system has been  reviewed and considered negative the exception was been listed in the HPI  Studies Reviewed EKG Interpretation Date/Time:  Thursday November 13 2023 09:02:16 EDT Ventricular Rate:  122 PR Interval:  148 QRS Duration:  96 QT Interval:  326 QTC Calculation: 464 R Axis:   90  Text Interpretation: Sinus tachycardia  Possible Left atrial enlargement Rightward axis Incomplete right bundle branch block When compared with ECG of 07-Apr-2023 11:04, No significant change was found Confirmed by Gerard Frederick ([id number]) on 11/13/2023 9:44:39 AM    Lexiscan  Myoview  04/15/23   The study is normal. The study is low risk.   No ST deviation was noted.   LV perfusion is normal. There is no evidence of ischemia. There is no evidence of infarction.   Left ventricular function is normal. Nuclear stress EF: 64%. End diastolic cavity size is normal. End systolic cavity size is normal.   CT attenuation images showed no evidence of aortic or coronary calcifications.   2D echo 01/14/2023: 1. Left ventricular ejection fraction, by estimation, is 50 to 55%. The  left ventricle has low normal function. Left ventricular endocardial  border not optimally defined to evaluate regional wall motion. There is  mild left ventricular hypertrophy. Left  ventricular diastolic parameters are consistent with Grade I diastolic  dysfunction (impaired relaxation).   2. Right ventricular systolic function is normal. The right ventricular  size is normal.   3. The mitral valve is normal in structure. No evidence of mitral valve  regurgitation. No evidence of mitral stenosis.   4. The aortic valve is normal in structure. Aortic valve regurgitation is  not visualized. No aortic stenosis is present.   5. The inferior vena cava is normal in size with greater than 50%  respiratory variability, suggesting right atrial pressure of 3 mmHg.   Risk Assessment/Calculations           Physical Exam VS:  BP 115/80 (BP Location: Left Arm, Patient Position: Sitting, Cuff Size: Normal)   Pulse (!) 122 Comment: 127 oximeter  Ht 6' 5 (1.956 m)   Wt 275 lb 6.4 oz (124.9 kg)   SpO2 93%   BMI 32.66 kg/m        Wt Readings from Last 3 Encounters:  11/13/23 275 lb 6.4 oz (124.9 kg)  11/10/23 270 lb (122.5 kg)  08/21/23 265 lb 2 oz (120.3 kg)    GEN:  Well nourished, well developed in no acute distress NECK: No JVD; No carotid bruits CARDIAC: RRR, tachycardic, no murmurs, rubs, gallops RESPIRATORY:  Clear to auscultation without rales, wheezing or rhonchi  ABDOMEN: Soft, non-tender, non-distended EXTREMITIES:  No edema; No deformity   ASSESSMENT AND PLAN Primary hypertension with blood pressure today 115/80.  States that he takes his carvedilol  on an as-needed basis has not had any today.  Also continues to take Adderall 20 mg 3 times daily.  He has been encouraged to monitor his pressures 1 to 2 hours postmedication administration at home as well.  Mixed hyperlipidemia with last LDL 77.  States that he has recently been advised by his primary care provider due to myalgias to stop taking his atorvastatin .  He is no longer on statin therapy.  Goal of his LDL is 70 or less.  Ongoing management per his PCP.  Tobacco abuse total cessation continues to be recommended.  COPD stage IIIb/chronic hypoxic respiratory failure on chronic oxygen therapy.  Ongoing management per pulmonary.  Morbid obesity with a BMI of 32.66.  Would benefit from weight  loss.  Sinus tachycardia with a rate of 122 noted today.  Patient states he has not had any of his beta-blocker today.  And states that his anxiety is elevated.  EKG reveals sinus tachycardia with rate of 122 with an incomplete right bundle branch block with no significant change from prior studies.  Continues to monitor his heart rate from his Fitbit.  Has been advised carvedilol  is not only for blood pressure but is also for heart rate.       Dispo: Patient to return to clinic with MD/APP in 6 months or sooner if needed for further evaluation  Signed, Yves Fodor, NP

## 2023-11-19 ENCOUNTER — Ambulatory Visit: Admitting: Pulmonary Disease

## 2023-11-19 ENCOUNTER — Ambulatory Visit: Admitting: Nurse Practitioner

## 2023-11-19 ENCOUNTER — Encounter: Payer: Self-pay | Admitting: Nurse Practitioner

## 2023-11-19 VITALS — BP 120/84 | HR 126 | Temp 98.1°F | Ht 77.0 in | Wt 271.0 lb

## 2023-11-19 DIAGNOSIS — B370 Candidal stomatitis: Secondary | ICD-10-CM

## 2023-11-19 DIAGNOSIS — J9611 Chronic respiratory failure with hypoxia: Secondary | ICD-10-CM

## 2023-11-19 DIAGNOSIS — R0602 Shortness of breath: Secondary | ICD-10-CM

## 2023-11-19 DIAGNOSIS — I890 Lymphedema, not elsewhere classified: Secondary | ICD-10-CM

## 2023-11-19 DIAGNOSIS — J449 Chronic obstructive pulmonary disease, unspecified: Secondary | ICD-10-CM | POA: Diagnosis not present

## 2023-11-19 DIAGNOSIS — I5032 Chronic diastolic (congestive) heart failure: Secondary | ICD-10-CM

## 2023-11-19 DIAGNOSIS — F17210 Nicotine dependence, cigarettes, uncomplicated: Secondary | ICD-10-CM | POA: Diagnosis not present

## 2023-11-19 LAB — NITRIC OXIDE: Nitric Oxide: 8

## 2023-11-19 MED ORDER — NYSTATIN 100000 UNIT/ML MT SUSP: 5.0000 mL | Freq: Four times a day (QID) | OROMUCOSAL | 0 refills | Status: AC

## 2023-11-19 MED ORDER — SPIRIVA RESPIMAT 2.5 MCG/ACT IN AERS: 2.0000 | INHALATION_SPRAY | Freq: Every day | RESPIRATORY_TRACT | 5 refills | Status: AC

## 2023-11-19 NOTE — Patient Instructions (Addendum)
 Continue Albuterol  inhaler 2 puffs or 3 mL neb every 6 hours as needed for shortness of breath or wheezing. Notify if symptoms persist despite rescue inhaler/neb use. Continue Brovana  2 mL neb Twice daily  Continue budesonide  2.5 mL neb Twice daily. Brush tongue and rinse mouth afterwards. Take a spoonful of peanut butter, yogurt or honey before you do your breathing treatments Try to hold the Yupelri  and see if the coughing gets better since this seems to impact the cough the most  Continue supplemental oxygen 2-4 lpm with activity and at night. Goal >88-90%. I want to see you using this more consistently with activity because without it, you're stressing out your heart and lungs   Mucinex  DM over the counter [phone] mg Twice daily for cough/congestion  Nystatin  5mL four times a day for 10 days. Swish and swallow   Restart the nicotine  patches - change every 24 hours. Use the 21 mcg patch to start. Work on quitting smoking while doing this   Follow up in 4-6 weeks with Dr. [name] or [name],NP. If symptoms do not improve or worsen, please contact office for sooner follow up or seek emergency care.

## 2023-11-19 NOTE — Progress Notes (Signed)
 @Patient  ID: Jeffrey Lin, male    DOB: 08/30/1975, 48 y.o.   MRN: [id number]  Chief Complaint  Patient presents with   COPD    Reports cough is worse. And oxygen level is constantly dropping in to the low 80's.     Referring provider: Avelina Greig BRAVO, MD  HPI: 48 year old male, heavy active smoker followed for severe COPD with chronic bronchitis, chronic respiratory failure. He is a patient of Dr. Nelda and last seen in office 07/30/2023. Past medical history significant for schizoaffective disorder, CHF, HTN, BPH, lymphedema of BLE, hx of opioid overdose.   TEST/EVENTS:  01/14/2023 echo: EF 50-55%, GIDD. RV size and function normal. 03/24/2023 CTA chest: bibasilar atelectasis. Elevated right hemidiaphragm. Scattered ground-glass densities. Marked improvement in RLL 03/27/2023 PFT: FVC 43, FEV1 40, ratio 76, TLC 67, DLCOunc 48 corrects to 86 for alveolar volume. No BD  07/02/2023 CXR: elevated right hemidiaphragm and bibasilar scarring   07/30/2023: OV with Dr. [name]. PFT with stage III COPD. Currently on Brovana  and Yupelri . Recovering well since January. Oxygen saturation remains low. Add budesonide  to regimen. Advised pulmonary rehab. Desaturated to 85% on room air with exertion; recovered with 2 lpm. Smoking cessation advised.   11/19/2023: Today - follow up Discussed the use of AI scribe software for clinical note transcription with the patient, who gave verbal consent to proceed.  History of Present Illness Jeffrey Lin is a 48 year old male with COPD who presents for follow-up regarding his nebulizer treatments and respiratory symptoms.  He has been using nebulizer medications, including Brovana , Yupelri , and budesonide  twice daily, which have caused throat irritation, soreness, and increased coughing. He feels like the Yupelri  causes the most issues and results in a paroxysmal cough. He finds inhalers easier to use but tolerates Brovana  and budesonide  better in nebulizer form.  He previously used [facility] but had adverse reactions to them.   Still gets short winded with most activities, which is baseline for him. Doesn't get up much phlegm when coughing. Not noticing much wheezing.   He has a history of smoking, COPD, and past pneumonia. He is concerned about maintaining adequate oxygen levels, noting that they drop, especially during physical activity. He is not consistent with his oxygen. Really only using it when at rest and at night. Sleeps with 4 lpm O2.   He has experienced leg edema due to past infections and uses compression socks to manage swelling. He has a history of lymphedema and has undergone therapy for it.  He has attempted to quit smoking using patches and lozenges but has struggled with cessation.  The patches did help in the past.   No fevers or hemoptysis. No orthopnea, PND, CP. He has not used Mucinex  for managing mucus and cough.  Lab Results  Component Value Date   NITRICOXIDE 8 11/19/2023   This result suggests low (<25) Type 2 (T2) airway inflammation indicating a low likelihood of active T2-driven airway inflammation; reduced probability of response to inhaled corticosteroids.      Allergies  Allergen Reactions   Toradol  [Ketorolac  Tromethamine ] Swelling   Trelegy Ellipta  [Fluticasone -Umeclidin-Vilant] Itching and Swelling    Swelling around his eyes   Wixela Inhub [Fluticasone -Salmeterol] Itching and Swelling   Lamotrigine Rash    Immunization History  Administered Date(s) Administered   Td 06/08/2009    Past Medical History:  Diagnosis Date   Anxiety    Chronic heart failure with preserved ejection fraction (HFpEF) (HCC)  a. 02/2016 Echo: EF 55-60%; b. 04/2019 Echo: EF 60-65%, no rwma, mod reduced RV fxn, mod dil RA; c. 07/2019 RHC: RA 2, RV 39/1, PA 39/15, PCWP 7, CO 9.1, CI 3.4.   Chronic leg pain    Depression    Lower extremity cellulitis    Morbid obesity (HCC)    Primary hypertension    Schizoaffective  disorder (HCC)    Tobacco abuse     Tobacco History: Social History   Tobacco Use  Smoking Status Every Day   Current packs/day: 1.50   Average packs/day: 1.5 packs/day for 28.2 years (42.2 ttl pk-yrs)   Types: Cigarettes   Start date: 09/22/1995   Passive exposure: Past  Smokeless Tobacco Former  Tobacco Comments   Pt states that he is cutting back - currently 1- 1.5 ppd.  Smoking 1/2 ppd.  States 1 cigarette lasts a couple of hours.  He is trying to quit, however is not using anything to assist him.  04/23/2023   Ready to quit: Not Answered Counseling given: Not Answered Tobacco comments: Pt states that he is cutting back - currently 1- 1.5 ppd.  Smoking 1/2 ppd.  States 1 cigarette lasts a couple of hours.  He is trying to quit, however is not using anything to assist him.  04/23/2023   Outpatient Medications Prior to Visit  Medication Sig Dispense Refill   albuterol  (VENTOLIN  HFA) 108 (90 Base) MCG/ACT inhaler Inhale 2 puffs into the lungs every 6 (six) hours as needed for wheezing or shortness of breath. 8 g 2   ALPRAZolam  (XANAX ) 1 MG tablet Take 1 mg by mouth 4 (four) times daily as needed.     amantadine  (SYMMETREL ) 100 MG capsule Take 100 mg by mouth 2 (two) times daily.     amphetamine -dextroamphetamine  (ADDERALL) 20 MG tablet Take 20 mg by mouth in the morning, at noon, and at bedtime.     arformoterol  (BROVANA ) 15 MCG/2ML NEBU Take 2 mLs (15 mcg total) by nebulization in the morning and at bedtime. 120 mL 3   aspirin  EC 81 MG tablet Take 1 tablet (81 mg total) by mouth daily. Swallow whole. 30 tablet 12   budesonide  (PULMICORT ) 0.25 MG/2ML nebulizer solution Take 2 mLs (0.25 mg total) by nebulization 2 (two) times daily. 60 mL 12   carvedilol  (COREG ) 25 MG tablet TAKE 1 TABLET (25 MG TOTAL) BY MOUTH TWICE A DAY WITH MEALS 180 tablet 3   Cholecalciferol (VITAMIN D3) 1.25 MG (50000 UT) CAPS TAKE 1 CAPSULE BY MOUTH ONE TIME PER WEEK (Patient taking differently: Take 1 capsule by  mouth as needed.) 12 capsule 0   FIBER PO Take by mouth.     fluticasone  (FLONASE ) 50 MCG/ACT nasal spray Place 1 spray into both nostrils daily. 100 mL 2   gabapentin  (NEURONTIN ) 600 MG tablet Take 1,200 mg by mouth 3 (three) times daily.      lidocaine  (LIDODERM ) 5 % Place 1 patch onto the skin daily. Remove & Discard patch within 12 hours or as directed by MD 3 patch 0   revefenacin  (YUPELRI ) 175 MCG/3ML nebulizer solution Take 3 mLs (175 mcg total) by nebulization daily. 84 mL 0   revefenacin  (YUPELRI ) 175 MCG/3ML nebulizer solution Take 3 mLs (175 mcg total) by nebulization daily.     risperiDONE  (RISPERDAL ) 2 MG tablet Take 2 mg by mouth at bedtime.     tiZANidine  (ZANAFLEX ) 2 MG tablet TAKE 1 TABLET (2 MG TOTAL) BY MOUTH 3 (THREE) TIMES DAILY AS  NEEDED FOR MUSCLE SPASMS. 270 tablet 0   Tiotropium Bromide-Olodaterol (STIOLTO RESPIMAT ) 2.5-2.5 MCG/ACT AERS Inhale 2 puffs into the lungs daily.     Tiotropium Bromide Monohydrate  (SPIRIVA  RESPIMAT) 2.5 MCG/ACT AERS Inhale 2 puffs into the lungs daily. (Patient not taking: Reported on 11/19/2023)     No facility-administered medications prior to visit.     Review of Systems: as above    Physical Exam:  BP 120/84   Pulse (!) 126   Temp 98.1 F (36.7 C) (Temporal)   Ht 6' 5 (1.956 m)   Wt 271 lb (122.9 kg)   SpO2 93%   BMI 32.14 kg/m   GEN: Pleasant, interactive, well-kempt; appears older than stated age; obese; in no acute distress HEENT:  Normocephalic and atraumatic. PERRLA. Sclera white. Nasal turbinates pink, moist and patent bilaterally. No rhinorrhea present. Oropharynx pink and moist, without exudate or edema. No lesions, ulcerations, or postnasal drip.  NECK:  Supple w/ fair ROM. No JVD present. No lymphadenopathy.   CV: Tachycardia, regular rhythm, no m/r/g, dependent peripheral edema. Pulses intact, +2 bilaterally. No cyanosis, pallor or clubbing. PULMONARY:  Unlabored, regular breathing. Diminished bilaterally A&P w/o  wheezes/rales/rhonchi. No accessory muscle use.  GI: BS present and normoactive. Soft, non-tender to palpation.  MSK: No erythema, warmth or tenderness.  Neuro: A/Ox3. No focal deficits noted.   Skin: Warm, no lesions or rashe Psych: Normal affect and behavior. Judgement and thought content appropriate.     Lab Results:  CBC    Component Value Date/Time   WBC 12.9 (H) 01/17/2023 0437   RBC 5.22 01/17/2023 0437   HGB 16.3 01/17/2023 0437   HGB 15.9 07/30/2019 0956   HCT 47.3 01/17/2023 0437   HCT 46.7 07/30/2019 0956   PLT 196 01/17/2023 0437   PLT 271 07/30/2019 0956   MCV 90.6 01/17/2023 0437   MCV 94 07/30/2019 0956   MCV 92 09/11/2013 0406   MCH 31.2 01/17/2023 0437   MCHC 34.5 01/17/2023 0437   RDW 13.1 01/17/2023 0437   RDW 13.3 07/30/2019 0956   RDW 12.8 09/11/2013 0406   LYMPHSABS 3.0 07/30/2019 0956   LYMPHSABS 3.3 09/11/2013 0406   MONOABS 0.7 07/27/2019 1630   MONOABS 0.4 09/11/2013 0406   EOSABS 0.3 07/30/2019 0956   EOSABS 0.1 09/11/2013 0406   BASOSABS 0.1 07/30/2019 0956   BASOSABS 0.0 09/11/2013 0406    BMET    Component Value Date/Time   NA 136 01/17/2023 0438   NA 140 07/30/2019 0956   NA 145 09/11/2013 0406   K 3.9 01/17/2023 0438   K 3.6 09/11/2013 0406   CL 96 (L) 01/17/2023 0438   CL 110 (H) 09/11/2013 0406   CO2 27 01/17/2023 0438   CO2 26 09/11/2013 0406   GLUCOSE 111 (H) 01/17/2023 0438   GLUCOSE 81 09/11/2013 0406   BUN 23 (H) 01/17/2023 0438   BUN 6 07/30/2019 0956   BUN 19 (H) 09/11/2013 0406   CREATININE 0.97 01/17/2023 0438   CREATININE 0.97 05/07/2019 1714   CALCIUM  9.2 01/17/2023 0438   CALCIUM  8.0 (L) 09/11/2013 0406   GFRNONAA >60 01/17/2023 0438   GFRNONAA >60 09/11/2013 0406   GFRAA 122 07/30/2019 0956   GFRAA >60 09/11/2013 0406    BNP    Component Value Date/Time   BNP 128.2 (H) 01/13/2023 1236   BNP 16 05/07/2019 1714     Imaging:  NCV with EMG(electromyography) Result Date: 10/23/2023 Tobie Tonita POUR,  DO     10/23/2023  11:40 AM [facility] [address] 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 NCV & EMG Findings: Extensive electrodiagnostic testing of the right lower extremity and additional studies of the left shows: Bilateral sural and superficial peroneal sensory responses are absent. Right peroneal and bilateral tibial motor responses show reduced amplitude (R1.1, R5.2, L7.7 mV).  Left peroneal (EDB) and bilateral peroneal motor responses at the tibialis anterior are within normal limits. Bilateral tibial H reflex study shows prolonged latency. Chronic motor axon loss changes are seen affecting bilateral flexor digitorum longus muscles, without accompanying active denervation. 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  Compared to prior study on 02/06/2017, demyelinating changes are no longer present. ___________________________ Tonita Blanch, DO Nerve Conduction Studies  Stim Site NR Peak (ms) Norm Peak (ms) O-P Amp (V) Norm O-P Amp Left Sup Peroneal Anti Sensory (Ant Lat Mall)  32 C 12 cm *NR  <4.5  >5 Right Sup Peroneal Anti Sensory (Ant Lat Mall)  32 C 12 cm *NR  <4.5  >5 Left Sural Anti Sensory (Lat Mall)  32 C Calf *NR  <4.5  >5 Right Sural Anti Sensory (Lat Mall)  32 C Calf *NR  <4.5  >5  Stim Site NR Onset (ms) Norm Onset (ms) O-P Amp (mV) Norm O-P Amp Site1 Site2 Delta-0 (ms) Dist (cm) Vel (m/s) Norm Vel (m/s) Left Peroneal Motor (Ext Dig Brev)  32 C Ankle    3.9 <5.5 4.1 >3 B Fib Ankle 9.8 40.0 41 >40 B Fib    13.7  2.3  Poplt B Fib 2.5 10.0 40 >40  Poplt    16.2  2.3        Right Peroneal Motor (Ext Dig Brev)  32 C Ankle    3.4 <5.5 *1.1 >3 B Fib Ankle 10.4 46.0 44 >40 B Fib    13.8  0.9  Poplt B Fib 2.4 10.0 42 >40 Poplt    16.2  0.8        Left Peroneal TA Motor (Tib Ant)  32 C Fib Head    2.4 <4.0 4.7 >4 Poplit Fib Head 1.8 10.0 56 >40 Poplit    4.2 <5.7 4.7        Right Peroneal TA Motor (Tib Ant)  32 C Fib Head    2.8 <4.0 5.9 >4 Poplit Fib Head 1.6 10.0 62 >40 Poplit    4.4 <5.7 5.8        Left Tibial Motor (Abd Hall Brev)  32 C Ankle    5.2 <6.0 *7.7 >8 Knee Ankle 10.2 46.0 45 >40 Knee    15.4  3.1        Right Tibial Motor (Abd Hall Brev)  32 C Ankle    4.5 <6.0 *5.2 >8 Knee Ankle 12.7 46.0 40 >40 Knee    17.2  2.5        Electromyography  Side Muscle Ins.Act Fibs Fasc Recrt Amp Dur Poly Activation Comment Right AntTibialis Nml Nml Nml Nml Nml Nml Nml Nml N/A Right Gastroc Nml Nml Nml Nml Nml Nml Nml Nml N/A Right Flex Dig Long Nml Nml Nml *  2- *1+ *1+ *1+ Nml N/A Right RectFemoris Nml Nml Nml Nml Nml Nml Nml Nml N/A Right GluteusMed Nml Nml Nml Nml Nml Nml Nml Nml N/A Left AntTibialis Nml Nml Nml Nml Nml Nml Nml Nml N/A Left Gastroc Nml Nml Nml Nml Nml Nml Nml Nml N/A Left Flex Dig Long Nml Nml Nml *2- *1+ *1+ *1+ Nml N/A Left RectFemoris Nml Nml Nml Nml Nml Nml Nml Nml N/A Left GluteusMed Nml Nml Nml Nml Nml Nml Nml Nml N/A Waveforms:                        Administration History     None          Latest Ref Rng & Units 03/27/2023    8:36 AM  PFT Results  FVC-Pre L 2.80   FVC-Predicted Pre % 43   FVC-Post L 2.67   FVC-Predicted Post % 41   Pre FEV1/FVC % % 71   Post FEV1/FCV % % 76   FEV1-Pre L 1.98   FEV1-Predicted Pre % 39   FEV1-Post L 2.04   DLCO uncorrected ml/min/mmHg 17.84   DLCO UNC% % 48   DLVA Predicted % 86   TLC L 5.67   TLC % Predicted % 67   RV % Predicted % 125     No results found for: NITRICOXIDE      Assessment & Plan:   No problem-specific Assessment & Plan notes found for this  encounter. Assessment and Plan Assessment & Plan Severe chronic obstructive pulmonary disease (COPD) with chronic hypoxemia requiring supplemental oxygen Severe COPD with chronic hypoxemia, exacerbated by smoking. High symptom burden at baseline. Current nebulizer treatments cause throat irritation and increased coughing, specifically Yupelri  causing paroxysmal cough. Advised on measures to limit upper airway irritation with nebs. Will treat for thrush with nystatin  rinses. D/c yupelri  and restart Spiriva ; side effect profile reviewed. Notify if worsening cough persists with change. Does not appear to be in acute exacerbation. Reviewed importance of compliance with supplemental oxygen and risks of untreated hypoxia. Smoking cessation is critical to prevent further lung damage and improve respiratory function. Action plan in place.  - Discontinued Yupelri  due to increased coughing. - Continue Brovana  and budesonide  nebulizer twice daily. - Added Spiriva , two puffs once daily in the morning. - Encouraged consistent use of supplemental oxygen, especially during activity. - Advised retrying nicotine  patches, starting with 21 microgram patch daily for six weeks.  Oral candidiasis (thrush) secondary to inhaled corticosteroid use Oral thrush likely secondary to inhaled corticosteroid use, presenting with throat soreness and irritation. - Prescribed mouth rinse for thrush, to be used four times daily for ten days. - Advised waiting one hour after rinsing before eating.  Nicotine  dependence, cigarettes Chronic nicotine  dependence with previous unsuccessful attempts at cessation using patches and lozenges. Extensive discussion surrounding smoking cessation with 5 minutes spent on topic. Smoking contributes to COPD exacerbation and overall health decline. - Encouraged retrying nicotine  patches, starting with 21 microgram patch daily for six weeks then titrating down as he reduces number of cigarettes a day    Lymphedema of lower extremities Chronic lymphedema in lower extremities.  Diastolic dysfunction of heart Diastolic dysfunction with impaired relaxation, contributing to increased cardiac workload and respiratory symptoms. No significant fluid retention noted.  Chronic respiratory failure Inconsistent use of supplemental O2. See above. Goal >88-90% - Continue supplemental oxygen with activity and at night     Advised if symptoms do not improve or worsen,  to please contact office for sooner follow up or seek emergency care.   I spent 45 minutes of dedicated to the care of this patient on the date of this encounter to include pre-visit review of records, face-to-face time with the patient discussing conditions above, post visit ordering of testing, clinical documentation with the electronic health record, making appropriate referrals as documented, and communicating necessary findings to members of the patients care team.  Comer LULLA Rouleau, NP 11/19/2023  Pt aware and understands NP's role.

## 2023-11-21 ENCOUNTER — Other Ambulatory Visit

## 2023-11-21 DIAGNOSIS — G608 Other hereditary and idiopathic neuropathies: Secondary | ICD-10-CM

## 2023-11-21 LAB — VITAMIN B12: Vitamin B-12: 297 pg/mL (ref 211–911)

## 2023-11-21 LAB — TSH: TSH: 0.82 u[IU]/mL (ref 0.35–5.50)

## 2023-11-21 LAB — HEMOGLOBIN A1C: Hgb A1c MFr Bld: 6.3 % (ref 4.6–6.5)

## 2023-11-25 ENCOUNTER — Ambulatory Visit: Payer: Self-pay | Admitting: Family Medicine

## 2023-11-25 DIAGNOSIS — G629 Polyneuropathy, unspecified: Secondary | ICD-10-CM

## 2023-11-25 DIAGNOSIS — R7689 Other specified abnormal immunological findings in serum: Secondary | ICD-10-CM

## 2023-11-25 LAB — PROTEIN ELECTROPHORESIS, SERUM
Albumin ELP: 4 g/dL (ref 3.8–4.8)
Alpha 1: 0.4 g/dL — ABNORMAL HIGH (ref 0.2–0.3)
Alpha 2: 0.7 g/dL (ref 0.5–0.9)
Beta 2: 0.5 g/dL (ref 0.2–0.5)
Beta Globulin: 0.6 g/dL (ref 0.4–0.6)
Gamma Globulin: 0.7 g/dL — ABNORMAL LOW (ref 0.8–1.7)
Total Protein: 6.9 g/dL (ref 6.1–8.1)

## 2023-11-25 LAB — ANTI-NUCLEAR AB-TITER (ANA TITER): ANA Titer 1: 1:40 {titer} — ABNORMAL HIGH

## 2023-11-25 LAB — RHEUMATOID FACTOR: Rheumatoid fact SerPl-aCnc: <10 [IU]/mL (ref <14)

## 2023-11-25 LAB — ANA: Anti Nuclear Antibody (ANA): POSITIVE — AB

## 2023-11-25 LAB — CYCLIC CITRUL PEPTIDE ANTIBODY, IGG: Cyclic Citrullin Peptide Ab: <16 U

## 2023-11-26 ENCOUNTER — Other Ambulatory Visit: Payer: Self-pay | Admitting: Family Medicine

## 2023-11-26 LAB — PROTEIN ELECTROPHORESIS, URINE REFLEX
Albumin ELP, Urine: 63 %
Alpha-1-Globulin, U: 5.2 %
Alpha-2-Globulin, U: 10 %
Beta Globulin, U: 18.1 %
Gamma Globulin, U: 3.8 %
Protein, Ur: 21.5 mg/dL

## 2024-01-05 ENCOUNTER — Ambulatory Visit: Admitting: Nurse Practitioner

## 2024-01-07 NOTE — Telephone Encounter (Signed)
 This has been faxed and patient has been sent a Mychart Letter with referral and contact information.    See referral notes for updates.  I have also sent the patient a direct message in addition to the letter.

## 2024-01-09 ENCOUNTER — Telehealth: Payer: Self-pay | Admitting: Family Medicine

## 2024-01-09 NOTE — Telephone Encounter (Signed)
 Copied from CRM #[id number]. Topic: Referral - Question >> Jan 09, 2024  1:13 PM [name] wrote: Reason for CRM: Patient states Rheumatologist office received referral bu office needs a referral from insurance since they only take so many people with [employer], agent asked patient has he reached out to insurance and he stated the request has to come from the doctor to [employer]. Patient has an appointment scheduled for February but they need referral from insurance company, they have the referral from Dr. Avelina.   Jeffrey Lin [phone]

## 2024-01-13 NOTE — Telephone Encounter (Signed)
 I spoke with patient, he is scheduled for February.   I tried to go ahead and complete this auth but received notification that the plan termination date is 01/21/24 and any Auths obtained if not fulfilled by this date would expire and have to be repeated 01/22/24.   Pt is aware that we will complete this Referral Auth after the New Year.

## 2024-01-30 ENCOUNTER — Telehealth: Payer: Self-pay | Admitting: Family Medicine

## 2024-01-30 DIAGNOSIS — G6289 Other specified polyneuropathies: Secondary | ICD-10-CM

## 2024-01-30 DIAGNOSIS — R7689 Other specified abnormal immunological findings in serum: Secondary | ICD-10-CM | POA: Insufficient documentation

## 2024-01-30 NOTE — Addendum Note (Signed)
 Addended by: AVELINA NO E on: 01/30/2024 04:52 PM   Modules accepted: Orders

## 2024-01-30 NOTE — Telephone Encounter (Signed)
 Copied from CRM [id number]. Topic: General - Call Back - No Documentation >> Jan 30, 2024  2:08 PM Rea C wrote: Reason for CRM: Patient called in and would like to speak with CMA Ashtyn in regards to his referral for his appointments. Previous notes show that he is already in discussion with her and would like to receive a call to follow up on the process of the referral for the new year.   [phone] (M)

## 2024-02-02 ENCOUNTER — Telehealth: Payer: Self-pay | Admitting: *Deleted

## 2024-02-02 NOTE — Telephone Encounter (Signed)
 Copied from CRM #[id number]. Topic: Referral - Question >> Jan 09, 2024  1:13 PM Jeffrey Lin wrote: Reason for CRM: Patient states Rheumatologist office received referral bu office needs a referral from insurance since they only take so many people with [employer], agent asked patient has he reached out to insurance and he stated the request has to come from the doctor to [employer]. Patient has an appointment scheduled for February but they need referral from insurance company, they have the referral from Dr. Avelina.   Jeffrey Lin [phone] >> Feb 02, 2024 12:31 PM Corin V wrote: Patient calling in to follow up on this. He wants to make sure that the clinic has submitted everything to insurance so that insurance can submit the referral. He stated it was not a prior authorization that was needed, but a doctor initiated referral through insurance.

## 2024-02-06 ENCOUNTER — Ambulatory Visit: Payer: Self-pay | Admitting: *Deleted

## 2024-02-06 ENCOUNTER — Ambulatory Visit (INDEPENDENT_AMBULATORY_CARE_PROVIDER_SITE_OTHER): Admitting: Family Medicine

## 2024-02-06 ENCOUNTER — Encounter: Payer: Self-pay | Admitting: Family Medicine

## 2024-02-06 VITALS — BP 142/84 | HR 115 | Temp 98.0°F | Ht 76.0 in | Wt 271.5 lb

## 2024-02-06 DIAGNOSIS — L02214 Cutaneous abscess of groin: Secondary | ICD-10-CM | POA: Diagnosis not present

## 2024-02-06 MED ORDER — DOXYCYCLINE HYCLATE 100 MG PO TABS: 100.0000 mg | ORAL_TABLET | Freq: Two times a day (BID) | ORAL | 0 refills | Status: AC

## 2024-02-06 NOTE — Telephone Encounter (Signed)
 FYI Only or Action Required?: Action required by provider: request for appointment, update on patient condition, and requesting call back. Patient mother reports patient will not go to UC as recommended.  Patient was last seen in primary care on 08/21/2023 by Avelina Greig BRAVO, MD.  Called Nurse Triage reporting [place].  Symptoms began yesterday.  Interventions attempted: Nothing.  Symptoms are: gradually worsening.  Triage Disposition: See HCP Within 4 Hours (Or PCP Triage)  Patient/caregiver understands and will follow disposition?: No, wishes to speak with PCP     Patient's mother requesting call back. Recommended UC due to no available appt today . Mother reports patient will not want to go to UC and requesting if patient can be seen next week. Please advise.          Reason for Disposition  [1] Swelling is red AND [2] size > 2 inches (5 cm)  (Exception: Itchy area of skin.)  Answer Assessment - Initial Assessment Questions No available appt today with any provider. Patient's mother on DPR reports patient will not go to UC due to wait and possible flu chances. Requesting call back if appt can be scheduled today or if patient can be seen next week. Please advise.       1. APPEARANCE of SWELLING: What does it look like?     Red raised, lump right lower area of abdomen 2. SIZE: How large is the swelling? (e.g., inches, cm; or compare to size of pinhead, tip of pen, eraser, coin, pea, grape, ping pong ball)      Quarter size  3. LOCATION: Where is the swelling located?     Right lower abdomen  4. ONSET: When did the swelling start?     Yesterday  5. COLOR: What color is it? Is there more than one color?      red 6. PAIN: Is there any pain? If Yes, ask: How bad is the pain? (Scale 1-10; or mild, moderate, severe)       Not sure but tender to touch 7. ITCH: Does it itch? If Yes, ask: How bad is the itch?      No  8. CAUSE: What do you think caused  the swelling?     Not sure  9 OTHER SYMPTOMS: Do you have any other symptoms? (e.g., fever)     Tender to touch, redness warm to touch . Dark in color but not a bruise. No other sx no fever no cold sx  Protocols used: Skin Lump or Localized Swelling-A-AH

## 2024-02-06 NOTE — Telephone Encounter (Signed)
 Spoke with Beryl and scheduled him to see Dr. Avelina this afternoon at 3:00 pm

## 2024-02-06 NOTE — Assessment & Plan Note (Signed)
 New, acute Most likely initial irritation from pant line now with bacterial infection. Patient with past recurrent cellulitis infections unclear if past MRSA.  Wound culture obtained from pustule.  No fluctuance or indication for incision and drainage Recommend warm compresses 3-4 times daily, start doxycycline  100 mg twice daily for 10 days. Patient will have mother check area for improvement/worsening   Return and ER precautions provided.  He will call for further recommendations if redness is spreading if fever on antibiotics or flulike symptoms he will go to the emergency room.

## 2024-02-06 NOTE — Progress Notes (Signed)
 "   Patient ID: Jeffrey Lin, male    DOB: September 19, 1975, 49 y.o.   MRN: [id number]  This visit was conducted in person.  BP (!) 142/84   Pulse (!) 115   Temp 98 F (36.7 C) (Oral)   Ht 6' 4 (1.93 m)   Wt 271 lb 8 oz (123.2 kg)   SpO2 (!) 86%   BMI 33.05 kg/m    CC:  Chief Complaint  Patient presents with   Lump on Lower Right Abdomen    Subjective:   HPI: Jeffrey Lin is a 49 y.o. male presenting on 02/06/2024 for Lump on Lower Right Abdomen   New onset lump on right lower abdomen... at belt line... 2 areas.. noted in last 2 days.  He feels that the pant belt buckle  may be rubbing.  Mildly tender.  Possible pustule next 2 it.  No fever.  No flu like symptoms.  No weakness, no dizziness.   He has been lying down more often lately.. 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  Continue leg pain.     History of recurrent cellulitis of legs ?  past MRSA infection. No family history of boils.  Relevant past medical, surgical, family and social history reviewed and updated as indicated. Interim medical history since our last visit reviewed. Allergies and medications reviewed and updated. Outpatient Medications Prior to Visit  Medication Sig Dispense Refill   albuterol  (VENTOLIN  HFA) 108 (90 Base) MCG/ACT inhaler Inhale 2 puffs into the lungs every 6 (six) hours as needed for wheezing or shortness of breath. 8 g 2   ALPRAZolam  (XANAX ) 1 MG tablet Take 1 mg by mouth 4 (four) times daily as needed.     amantadine  (SYMMETREL ) 100 MG capsule Take 100 mg by mouth 2 (two) times daily.     amphetamine -dextroamphetamine  (ADDERALL) 20 MG tablet Take 20 mg by mouth in the morning, at noon, and at bedtime.     arformoterol  (BROVANA ) 15 MCG/2ML NEBU Take 2 mLs (15 mcg total) by nebulization in the morning and at bedtime. 120 mL 3   aspirin  EC 81 MG tablet Take 1 tablet (81 mg total) by mouth daily. Swallow whole. 30 tablet 12   budesonide  (PULMICORT ) 0.25 MG/2ML nebulizer solution  Take 2 mLs (0.25 mg total) by nebulization 2 (two) times daily. 60 mL 12   carvedilol  (COREG ) 25 MG tablet TAKE 1 TABLET (25 MG TOTAL) BY MOUTH TWICE A DAY WITH MEALS 180 tablet 3   Cholecalciferol (VITAMIN D3) 1.25 MG (50000 UT) CAPS TAKE 1 CAPSULE BY MOUTH ONE TIME PER WEEK 12 capsule 0   FIBER PO Take by mouth.     fluticasone  (FLONASE ) 50 MCG/ACT nasal spray Place 1 spray into both nostrils daily. 100 mL 2   gabapentin  (NEURONTIN ) 600 MG tablet Take 1,200 mg by mouth 3 (three) times daily.      lidocaine  (LIDODERM ) 5 % Place 1 patch onto the skin daily. Remove & Discard patch within 12 hours or as directed by MD 3 patch 0   risperiDONE  (RISPERDAL ) 2 MG tablet Take 2 mg by mouth at bedtime.     Tiotropium Bromide (SPIRIVA  RESPIMAT) 2.5 MCG/ACT AERS Inhale 2 puffs into the lungs daily. 4 g 5   tiZANidine  (ZANAFLEX ) 2 MG tablet TAKE 1 TABLET (2 MG TOTAL) BY MOUTH 3 (THREE) TIMES DAILY AS NEEDED FOR MUSCLE SPASMS. 270 tablet 0   No facility-administered medications prior to visit.     Per HPI unless  specifically indicated in ROS section below Review of Systems  Constitutional:  Negative for fatigue and fever.  HENT:  Negative for ear pain.   Eyes:  Negative for pain.  Respiratory:  Negative for cough and shortness of breath.   Cardiovascular:  Negative for chest pain, palpitations and leg swelling.  Gastrointestinal:  Negative for abdominal pain.  Genitourinary:  Negative for dysuria.  Musculoskeletal:  Negative for arthralgias.  Neurological:  Negative for syncope, light-headedness and headaches.  Psychiatric/Behavioral:  Negative for dysphoric mood.    Objective:  BP (!) 142/84   Pulse (!) 115   Temp 98 F (36.7 C) (Oral)   Ht 6' 4 (1.93 m)   Wt 271 lb 8 oz (123.2 kg)   SpO2 (!) 86%   BMI 33.05 kg/m   Wt Readings from Last 3 Encounters:  02/06/24 271 lb 8 oz (123.2 kg)  11/19/23 271 lb (122.9 kg)  11/13/23 275 lb 6.4 oz (124.9 kg)      Physical Exam Constitutional:       Appearance: He is well-developed.  HENT:     Head: Normocephalic.     Right Ear: Hearing normal.     Left Ear: Hearing normal.     Nose: Nose normal.  Neck:     Thyroid : No thyroid  mass or thyromegaly.     Vascular: No carotid bruit.     Trachea: Trachea normal.  Cardiovascular:     Rate and Rhythm: Normal rate and regular rhythm.     Pulses: Normal pulses.     Heart sounds: Heart sounds not distant. No murmur heard.    No friction rub. No gallop.     Comments: No peripheral edema Pulmonary:     Effort: Pulmonary effort is normal. No respiratory distress.     Breath sounds: Normal breath sounds.  Skin:    General: Skin is warm and dry.     Findings: No rash.         Comments:  Few erythematous lesions right groin, superior area with small pustule no surrounding erythema Or significant inferior lesion with central pustule and dime size erythema with nickel size induration, mildly tenderness to palpation No associated lymphadenopathy  Psychiatric:        Speech: Speech normal.        Behavior: Behavior normal.        Thought Content: Thought content normal.       Results for orders placed or performed in visit on 11/21/23  Hemoglobin A1c   Collection Time: 11/21/23  7:36 AM  Result Value Ref Range   Hgb A1c MFr Bld 6.3 4.6 - 6.5 %  Vitamin B12   Collection Time: 11/21/23  7:36 AM  Result Value Ref Range   Vitamin B-12 297 211 - 911 pg/mL  Rheumatoid factor   Collection Time: 11/21/23  7:36 AM  Result Value Ref Range   Rheumatoid fact SerPl-aCnc <10 <14 IU/mL  Cyclic citrul peptide antibody, IgG   Collection Time: 11/21/23  7:36 AM  Result Value Ref Range   Cyclic Citrullin Peptide Ab <83 UNITS  ANA   Collection Time: 11/21/23  7:36 AM  Result Value Ref Range   Anti Nuclear Antibody (ANA) POSITIVE (A) NEGATIVE  TSH   Collection Time: 11/21/23  7:36 AM  Result Value Ref Range   TSH 0.82 0.35 - 5.50 uIU/mL  Protein Electrophoresis, Urine Rflx.   Collection Time:  11/21/23  7:36 AM  Result Value Ref Range   Protein, Ur 21.5  Not Estab. mg/dL   Albumin ELP, Urine 36.9 %   Alpha-1-Globulin, U 5.2 %   Alpha-2-Globulin, U 10.0 %   Beta Globulin, U 18.1 %   Gamma Globulin, U 3.8 %   M Component, Ur Not Observed Not Observed %   Please Note: Comment   Protein electrophoresis, serum   Collection Time: 11/21/23  7:36 AM  Result Value Ref Range   Total Protein 6.9 6.1 - 8.1 g/dL   Albumin ELP 4.0 3.8 - 4.8 g/dL   Alpha 1 0.4 (H) 0.2 - 0.3 g/dL   Alpha 2 0.7 0.5 - 0.9 g/dL   Beta Globulin 0.6 0.4 - 0.6 g/dL   Beta 2 0.5 0.2 - 0.5 g/dL   Gamma Globulin 0.7 (L) 0.8 - 1.7 g/dL   Abnormal Protein Band1  NONE DETECTED g/dL   SPE Interp.    Anti-nuclear ab-titer (ANA titer)   Collection Time: 11/21/23  7:36 AM  Result Value Ref Range   ANA Titer 1 1:40 (H) titer   ANA Pattern 1 Nuclear, Speckled (A)     Assessment and Plan  Abscess of right groin Assessment & Plan: New, acute Most likely initial irritation from pant line now with bacterial infection. Patient with past recurrent cellulitis infections unclear if past MRSA.  Wound culture obtained from pustule.  No fluctuance or indication for incision and drainage Recommend warm compresses 3-4 times daily, start doxycycline  100 mg twice daily for 10 days. Patient will have mother check area for improvement/worsening   Return and ER precautions provided.  He will call for further recommendations if redness is spreading if fever on antibiotics or flulike symptoms he will go to the emergency room.  Orders: -     WOUND CULTURE  Other orders -     Doxycycline  Hyclate; Take 1 tablet (100 mg total) by mouth 2 (two) times daily.  Dispense: 20 tablet; Refill: 0    No follow-ups on file.   Greig Ring, MD  "

## 2024-02-09 LAB — AEROBIC CULTURE
MICRO NUMBER:: 17479272
SPECIMEN QUALITY:: ADEQUATE

## 2024-02-10 ENCOUNTER — Ambulatory Visit: Payer: Self-pay | Admitting: Family Medicine

## 2024-04-22 ENCOUNTER — Ambulatory Visit: Admitting: Cardiology
